# Patient Record
Sex: Male | Born: 1935
Health system: Southern US, Community
[De-identification: ages and names within clinical notes are randomized; demographics above are authoritative.]

## PROBLEM LIST (undated history)

## (undated) DIAGNOSIS — I739 Peripheral vascular disease, unspecified: Secondary | ICD-10-CM

## (undated) DIAGNOSIS — C4490 Unspecified malignant neoplasm of skin, unspecified: Secondary | ICD-10-CM

## (undated) DIAGNOSIS — I499 Cardiac arrhythmia, unspecified: Secondary | ICD-10-CM

## (undated) DIAGNOSIS — I509 Heart failure, unspecified: Secondary | ICD-10-CM

## (undated) DIAGNOSIS — I10 Essential (primary) hypertension: Secondary | ICD-10-CM

## (undated) DIAGNOSIS — Z9289 Personal history of other medical treatment: Secondary | ICD-10-CM

## (undated) DIAGNOSIS — I255 Ischemic cardiomyopathy: Secondary | ICD-10-CM

## (undated) DIAGNOSIS — G459 Transient cerebral ischemic attack, unspecified: Secondary | ICD-10-CM

## (undated) DIAGNOSIS — I219 Acute myocardial infarction, unspecified: Secondary | ICD-10-CM

## (undated) DIAGNOSIS — M1990 Unspecified osteoarthritis, unspecified site: Secondary | ICD-10-CM

## (undated) DIAGNOSIS — Z8673 Personal history of transient ischemic attack (TIA), and cerebral infarction without residual deficits: Secondary | ICD-10-CM

## (undated) DIAGNOSIS — E782 Mixed hyperlipidemia: Secondary | ICD-10-CM

## (undated) DIAGNOSIS — I2510 Atherosclerotic heart disease of native coronary artery without angina pectoris: Secondary | ICD-10-CM

## (undated) DIAGNOSIS — N390 Urinary tract infection, site not specified: Secondary | ICD-10-CM

## (undated) DIAGNOSIS — M89669 Osteopathy after poliomyelitis, unspecified lower leg: Secondary | ICD-10-CM

## (undated) DIAGNOSIS — I513 Intracardiac thrombosis, not elsewhere classified: Secondary | ICD-10-CM

## (undated) DIAGNOSIS — B91 Sequelae of poliomyelitis: Secondary | ICD-10-CM

## (undated) DIAGNOSIS — Z8701 Personal history of pneumonia (recurrent): Secondary | ICD-10-CM

## (undated) HISTORY — DX: Cardiac arrhythmia, unspecified: I49.9

## (undated) HISTORY — PX: JOINT REPLACEMENT: SHX530

## (undated) HISTORY — DX: Personal history of pneumonia (recurrent): Z87.01

## (undated) HISTORY — DX: Intracardiac thrombosis, not elsewhere classified: I51.3

## (undated) HISTORY — DX: Mixed hyperlipidemia: E78.2

## (undated) HISTORY — DX: Personal history of transient ischemic attack (TIA), and cerebral infarction without residual deficits: Z86.73

## (undated) HISTORY — DX: Heart failure, unspecified: I50.9

---
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
## 1978-04-02 DIAGNOSIS — I219 Acute myocardial infarction, unspecified: Secondary | ICD-10-CM

## 1978-04-02 HISTORY — DX: Acute myocardial infarction, unspecified: I21.9

## 1979-04-03 HISTORY — PX: CORONARY ARTERY BYPASS GRAFT: SHX141

## 1987-04-03 DIAGNOSIS — G459 Transient cerebral ischemic attack, unspecified: Secondary | ICD-10-CM

## 1987-04-03 HISTORY — DX: Transient cerebral ischemic attack, unspecified: G45.9

## 2001-08-18 ENCOUNTER — Emergency Department (HOSPITAL_COMMUNITY): Admission: EM | Admit: 2001-08-18 | Discharge: 2001-08-18 | Payer: Self-pay | Admitting: *Deleted

## 2006-01-16 ENCOUNTER — Ambulatory Visit: Payer: Self-pay | Admitting: Gastroenterology

## 2006-02-12 ENCOUNTER — Ambulatory Visit: Payer: Self-pay | Admitting: Gastroenterology

## 2006-02-12 LAB — CONVERTED CEMR LAB: aPTT: 32.1 s (ref 26.5–36.5)

## 2010-09-12 ENCOUNTER — Other Ambulatory Visit (HOSPITAL_COMMUNITY): Payer: Self-pay | Admitting: Urology

## 2010-09-13 ENCOUNTER — Ambulatory Visit (HOSPITAL_COMMUNITY)
Admission: RE | Admit: 2010-09-13 | Discharge: 2010-09-13 | Disposition: A | Discharge status: Home / Self Care | Payer: Medicare Other | Source: Ambulatory Visit | Attending: Urology | Admitting: Urology

## 2010-09-13 DIAGNOSIS — N489 Disorder of penis, unspecified: Secondary | ICD-10-CM | POA: Insufficient documentation

## 2010-09-13 DIAGNOSIS — N329 Bladder disorder, unspecified: Secondary | ICD-10-CM | POA: Insufficient documentation

## 2010-09-13 DIAGNOSIS — R319 Hematuria, unspecified: Secondary | ICD-10-CM | POA: Insufficient documentation

## 2010-09-13 MED ORDER — IOHEXOL 300 MG/ML  SOLN
125.0000 mL | Freq: Once | INTRAMUSCULAR | Status: AC | PRN
  Administered 2010-09-13: 125 mL via INTRAVENOUS

## 2010-09-29 ENCOUNTER — Other Ambulatory Visit (HOSPITAL_COMMUNITY): Payer: Medicare Other

## 2010-10-03 ENCOUNTER — Encounter (HOSPITAL_COMMUNITY)
Admission: RE | Admit: 2010-10-03 | Discharge: 2010-10-03 | Disposition: A | Discharge status: Home / Self Care | Payer: Medicare Other | Source: Ambulatory Visit | Attending: Urology | Admitting: Urology

## 2010-10-03 ENCOUNTER — Other Ambulatory Visit: Payer: Self-pay

## 2010-10-03 ENCOUNTER — Encounter (HOSPITAL_COMMUNITY): Payer: Self-pay

## 2010-10-03 HISTORY — DX: Peripheral vascular disease, unspecified: I73.9

## 2010-10-03 HISTORY — DX: Osteopathy after poliomyelitis, unspecified lower leg: M89.669

## 2010-10-03 HISTORY — DX: Unspecified osteoarthritis, unspecified site: M19.90

## 2010-10-03 HISTORY — DX: Atherosclerotic heart disease of native coronary artery without angina pectoris: I25.10

## 2010-10-03 HISTORY — DX: Sequelae of poliomyelitis: B91

## 2010-10-03 HISTORY — DX: Acute myocardial infarction, unspecified: I21.9

## 2010-10-03 LAB — SURGICAL PCR SCREEN: Staphylococcus aureus: NEGATIVE

## 2010-10-03 LAB — BASIC METABOLIC PANEL
BUN: 24 mg/dL — ABNORMAL HIGH (ref 6–23)
Calcium: 9.6 mg/dL (ref 8.4–10.5)
GFR calc Af Amer: >60 mL/min (ref >60)
GFR calc non Af Amer: >60 mL/min (ref >60)
Glucose, Bld: 99 mg/dL (ref 70–99)
Potassium: 4.2 mEq/L (ref 3.5–5.1)
Sodium: 137 mEq/L (ref 135–145)

## 2010-10-03 LAB — CBC
MCH: 30.2 pg (ref 26.0–34.0)
MCHC: 33.5 g/dL (ref 30.0–36.0)
RDW: 12.6 % (ref 11.5–15.5)

## 2010-10-03 NOTE — Patient Instructions (Signed)
20 ETHEN Benjamin Cordova  10/03/2010   Your procedure is scheduled on:  10/10/10  Report to [facility] at 0730 AM.  Call this number if you have problems the morning of surgery: [phone]   Remember:   Do not eat food:After Midnight.  Do not drink clear liquids: After Midnight.  Take these medicines the morning of surgery with A SIP OF WATER: atenolol, hydrochlorothiazide             STOP WARFARIN AS MD ORDERED 5 DAYS PRIOR TO SURGERY   Do not wear jewelry, make-up or nail polish.  Do not bring valuables to the hospital.  Contacts, dentures or bridgework may not be worn into surgery.  Leave suitcase in the car. After surgery it may be brought to your room.  For patients admitted to the hospital, checkout time is 11:00 AM the day of discharge.   Patients discharged the day of surgery will not be allowed to drive home.  Name and phone number of your driver: wife  Special Instructions: CHG Shower Shower 2 days before surgery and 1 day before surgery with Hibiclens.   Please read over the following fact sheets that you were given: Pain Booklet, MRSA Information, Surgical Site Infection Prevention and Anesthesia Post-op Instructions   PATIENT INSTRUCTIONS POST-ANESTHESIA  IMMEDIATELY FOLLOWING SURGERY:  Do not drive or operate machinery for the first twenty four hours after surgery.  Do not make any important decisions for twenty four hours after surgery or while taking narcotic pain medications or sedatives.  If you develop intractable nausea and vomiting or a severe headache please notify your doctor immediately.  FOLLOW-UP:  Please make an appointment with your surgeon as instructed. You do not need to follow up with anesthesia unless specifically instructed to do so.  WOUND CARE INSTRUCTIONS (if applicable):  Keep a dry clean dressing on the anesthesia/puncture wound site if there is drainage.  Once the wound has quit draining you may leave it open to air.  Generally you should leave the  bandage intact for twenty four hours unless there is drainage.  If the epidural site drains for more than 36-48 hours please call the anesthesia department.  QUESTIONS?:  Please feel free to call your physician or the hospital operator if you have any questions, and they will be happy to assist you.     [facility] Anesthesia Department [address] [place] [phone]

## 2010-10-05 ENCOUNTER — Ambulatory Visit (HOSPITAL_COMMUNITY): Admission: RE | Admit: 2010-10-05 | Payer: Medicare Other | Source: Ambulatory Visit | Admitting: Urology

## 2010-10-10 ENCOUNTER — Encounter (HOSPITAL_COMMUNITY): Payer: Self-pay | Admitting: *Deleted

## 2010-10-10 ENCOUNTER — Encounter (HOSPITAL_COMMUNITY): Payer: Self-pay | Admitting: Anesthesiology

## 2010-10-10 ENCOUNTER — Ambulatory Visit (HOSPITAL_COMMUNITY)
Admission: RE | Admit: 2010-10-10 | Discharge: 2010-10-10 | Disposition: A | Discharge status: Home / Self Care | Payer: Medicare Other | Source: Ambulatory Visit | Attending: Urology | Admitting: Urology

## 2010-10-10 ENCOUNTER — Ambulatory Visit (HOSPITAL_COMMUNITY): Payer: Medicare Other | Admitting: Anesthesiology

## 2010-10-10 ENCOUNTER — Encounter (HOSPITAL_COMMUNITY)
Admission: RE | Disposition: A | Discharge status: Home / Self Care | Payer: Self-pay | Source: Ambulatory Visit | Attending: Urology

## 2010-10-10 ENCOUNTER — Ambulatory Visit (HOSPITAL_COMMUNITY): Payer: Medicare Other

## 2010-10-10 DIAGNOSIS — N329 Bladder disorder, unspecified: Secondary | ICD-10-CM

## 2010-10-10 DIAGNOSIS — Z01812 Encounter for preprocedural laboratory examination: Secondary | ICD-10-CM | POA: Insufficient documentation

## 2010-10-10 DIAGNOSIS — R310 Gross hematuria: Secondary | ICD-10-CM | POA: Insufficient documentation

## 2010-10-10 DIAGNOSIS — N400 Enlarged prostate without lower urinary tract symptoms: Secondary | ICD-10-CM | POA: Insufficient documentation

## 2010-10-10 HISTORY — PX: CYSTOSCOPY WITH INJECTION: SHX1424

## 2010-10-10 SURGERY — CYSTOSCOPY, WITH INJECTION OF BLADDER NECK OR BLADDER WALL
Anesthesia: General | Wound class: Clean Contaminated

## 2010-10-10 MED ORDER — LIDOCAINE HCL (PF) 1 % IJ SOLN
INTRAMUSCULAR | Status: AC
  Filled 2010-10-10: qty 2

## 2010-10-10 MED ORDER — NON FORMULARY: Status: DC | PRN

## 2010-10-10 MED ORDER — ONDANSETRON HCL 4 MG/2ML IJ SOLN: 4.0000 mg | Freq: Once | INTRAMUSCULAR | Status: DC | PRN

## 2010-10-10 MED ORDER — PROPOFOL 10 MG/ML IV EMUL
INTRAVENOUS | Status: DC | PRN
  Administered 2010-10-10: 160 mg via INTRAVENOUS

## 2010-10-10 MED ORDER — LACTATED RINGERS IV SOLN
INTRAVENOUS | Status: DC | PRN
  Administered 2010-10-10 (×2): via INTRAVENOUS

## 2010-10-10 MED ORDER — SURGILUBE EX GEL
CUTANEOUS | Status: DC | PRN
  Administered 2010-10-10: 4 via TOPICAL

## 2010-10-10 MED ORDER — LACTATED RINGERS IV SOLN
INTRAVENOUS | Status: DC
  Administered 2010-10-10: 1000 mL via INTRAVENOUS

## 2010-10-10 MED ORDER — MIDAZOLAM HCL 2 MG/2ML IJ SOLN
INTRAMUSCULAR | Status: AC
  Filled 2010-10-10: qty 2

## 2010-10-10 MED ORDER — FENTANYL CITRATE 0.05 MG/ML IJ SOLN
INTRAMUSCULAR | Status: DC
Start: 2010-10-10 — End: 2010-10-10
  Filled 2010-10-10: qty 2

## 2010-10-10 MED ORDER — PROPOFOL 10 MG/ML IV EMUL
INTRAVENOUS | Status: AC
  Filled 2010-10-10: qty 20

## 2010-10-10 MED ORDER — MIDAZOLAM HCL 2 MG/2ML IJ SOLN
1.0000 mg | INTRAMUSCULAR | Status: DC | PRN
  Administered 2010-10-10 (×2): 2 mg via INTRAVENOUS

## 2010-10-10 MED ORDER — MIDAZOLAM HCL 2 MG/2ML IJ SOLN
INTRAMUSCULAR | Status: AC
  Administered 2010-10-10: 2 mg via INTRAVENOUS
  Filled 2010-10-10: qty 2

## 2010-10-10 MED ORDER — FENTANYL CITRATE 0.05 MG/ML IJ SOLN
INTRAMUSCULAR | Status: DC | PRN
  Administered 2010-10-10: 50 ug via INTRAVENOUS

## 2010-10-10 MED ORDER — FENTANYL CITRATE 0.05 MG/ML IJ SOLN: 25.0000 ug | INTRAMUSCULAR | Status: DC | PRN

## 2010-10-10 MED ORDER — EPHEDRINE SULFATE 50 MG/ML IJ SOLN
INTRAMUSCULAR | Status: DC | PRN
  Administered 2010-10-10: 10 mg via INTRAVENOUS

## 2010-10-10 MED ORDER — IOHEXOL 350 MG/ML SOLN
INTRAVENOUS | Status: DC | PRN
  Administered 2010-10-10: 50 mL

## 2010-10-10 SURGICAL SUPPLY — 27 items
BAG DRAIN URO TABLE W/ADPT NS (DRAPE) ×2 IMPLANT
BAG DRN 8 ADPR NS SKTRN CSTL (DRAPE) ×1
BAG HAMPER (MISCELLANEOUS) ×2 IMPLANT
CATH 5 FR WEDGE TIP (UROLOGICAL SUPPLIES) ×2 IMPLANT
CLOTH BEACON ORANGE TIMEOUT ST (SAFETY) ×2 IMPLANT
DECANTER SPIKE VIAL GLASS SM (MISCELLANEOUS) ×2 IMPLANT
DILATOR BALLN URETERAL SET (BALLOONS) IMPLANT
FLOOR PAD 36X40 (MISCELLANEOUS)
GLOVE BIO SURGEON STRL SZ7 (GLOVE) ×2 IMPLANT
GLOVE ECLIPSE 7.0 STRL STRAW (GLOVE) ×1 IMPLANT
GLOVE ECLIPSE 7.5 STRL STRAW (GLOVE) ×1 IMPLANT
GLOVE INDICATOR 6.5 STRL GRN (GLOVE) ×1 IMPLANT
GLOVE INDICATOR 7.0 STRL GRN (GLOVE) ×1 IMPLANT
GLOVE INDICATOR 7.5 STRL GRN (GLOVE) ×1 IMPLANT
GLOVE SS BIOGEL STRL SZ 6.5 (GLOVE) IMPLANT
GLOVE SUPERSENSE BIOGEL SZ 6.5 (GLOVE) ×1
GOWN BRE IMP SLV AUR XL STRL (GOWN DISPOSABLE) ×2 IMPLANT
IV NS IRRIG 3000ML ARTHROMATIC (IV SOLUTION) ×4 IMPLANT
KIT ROOM TURNOVER AP CYSTO (KITS) ×2 IMPLANT
MANIFOLD NEPTUNE II (INSTRUMENTS) ×2 IMPLANT
PACK CYSTO (CUSTOM PROCEDURE TRAY) ×2 IMPLANT
PAD ARMBOARD 7.5X6 YLW CONV (MISCELLANEOUS) ×2 IMPLANT
PAD FLOOR 36X40 (MISCELLANEOUS) ×1 IMPLANT
STENT PERCUFLEX 4.8FRX24 (STENTS) IMPLANT
TOWEL OR 17X26 4PK STRL BLUE (TOWEL DISPOSABLE) ×2 IMPLANT
WATER STERILE IRR 1000ML POUR (IV SOLUTION) ×2 IMPLANT
WIRE GUIDE BENTSON .035 15CM (WIRE) ×2 IMPLANT

## 2010-10-10 NOTE — Anesthesia Postprocedure Evaluation (Signed)
  Anesthesia Post-op Note  Patient: Benjamin Cordova  Procedure(s) Performed:  CYSTOSCOPY WITH INJECTION - with retrograde urethrogram  Patient Location: PACU  Anesthesia Type: General  Level of Consciousness: alert   Airway and Oxygen Therapy: Patient Spontanous Breathing  Post-op Pain: none  Post-op Assessment: Patient's Cardiovascular Status Stable, Respiratory Function Stable and Patent Airway  Post-op Vital Signs: stable  Complications: No apparent anesthesia complications

## 2010-10-10 NOTE — Anesthesia Procedure Notes (Addendum)
Performed by: Despina Hidden    Performed by: Despina Hidden

## 2010-10-10 NOTE — Anesthesia Preprocedure Evaluation (Addendum)
Anesthesia Evaluation  Name, MR# and DOB Patient awake  General Assessment Comment  Reviewed: Allergy & Precautions, H&P , Patient's Chart, lab work & pertinent test results and reviewed documented beta blocker date and time   History of Anesthesia Complications Negative for: history of anesthetic complications  Airway Mallampati: II TM Distance: >3 FB Neck ROM: Full    Dental  (+) Edentulous Upper and Edentulous Lower   Pulmonary    pulmonary exam normal   Cardiovascular hypertension, Pt. on medications and Pt. on home beta blockers + CAD (no CP now), + Past MI (1980) and + CABG    Neuro/PsychPolio 1952 CVA, No Residual Symptoms  GI/Hepatic/Renal Gross Hematuria    Endo/Other   Abdominal   Musculoskeletal  Hematology   Peds  Reproductive/Obstetrics          Anesthesia Physical Anesthesia Plan  ASA: III  Anesthesia Plan: General   Post-op Pain Management:    Induction:   Airway Management Planned: LMA  Additional Equipment:   Intra-op Plan:   Post-operative Plan:   Informed Consent: I have reviewed the patients History and Physical, chart, labs and discussed the procedure including the risks, benefits and alternatives for the proposed anesthesia with the patient or authorized representative who has indicated his/her understanding and acceptance.     Plan Discussed with: CRNA  Anesthesia Plan Comments:         Anesthesia Quick Evaluation

## 2010-10-10 NOTE — Brief Op Note (Addendum)
10/10/2010  12:23 PM  PATIENT:  Benjamin Cordova  76 y.o. male  PRE-OPERATIVE DIAGNOSIS:  gross hematuria  POST-OPERATIVE DIAGNOSIS:  gross hematuria, benign prostatic hypertrophy  PROCEDURE:  Procedure(s): CYSTOSCOPY and retrograde urethrogram  SURGEON:  Surgeon(s): Ky Barban  PHYSICIAN ASSISTANT:   ASSISTANTS: no   ANESTHESIA:   general  ESTIMATED BLOOD LOSS: * No blood loss . *   BLOOD ADMINISTERED:none  DRAINS: none   LOCAL MEDICATIONS USED:  NONE  SPECIMEN:  No Specimen  DISPOSITION OF SPECIMEN:  N/A  COUNTS:  YES  TOURNIQUET:  * No tourniquets in log *  DICTATION #: [id number]---  PLAN OF CARE:  PATIENT DISPOSITION:  PACU - hemodynamically stable.

## 2010-10-10 NOTE — Transfer of Care (Signed)
Immediate Anesthesia Transfer of Care Note  Patient: Benjamin Cordova  Procedure(s) Performed:  CYSTOSCOPY WITH INJECTION - with retrograde urethrogram  Patient Location: PACU  Anesthesia Type: General  Level of Consciousness: awake, alert  and oriented  Airway & Oxygen Therapy: non-rebreather face mask  Post-op Assessment: Report given to PACU RN, Post -op Vital signs reviewed and stable and Patient moving all extremities  Post vital signs: stable  Complications: No apparent anesthesia complications

## 2010-10-10 NOTE — H&P (Signed)
  Please see dictated h&pUpdate no change

## 2010-10-11 NOTE — Progress Notes (Signed)
Bleeding much better today.

## 2010-10-11 NOTE — Op Note (Signed)
NAME:  Benjamin Cordova, TORY NO.:  [id number]  MEDICAL RECORD NO.:  [id number]  LOCATION:                                 FACILITY:  PHYSICIAN:  Ky Barban, M.D.DATE OF BIRTH:  [birth year]  DATE OF PROCEDURE: DATE OF DISCHARGE:                              OPERATIVE REPORT   PREOPERATIVE DIAGNOSIS:  Gross hematuria.  POSTOPERATIVE DIAGNOSIS:  Benign prostatic hypertrophy.  PROCEDURE:  Cystoscopy, retrograde urethrogram.  PROCEDURE IN DETAIL:  The patient placed in lithotomy position.  After usual prep and drape under general anesthesia, Brodney clamp was applied to the penis and solution of KY-Jelly and Hypaque was injected into the urethra under fluoroscopic control.  The urethra looks normal.  I do not see any stricture.  The clamp was removed.  I introduced #25 cystoscope under direct vision, went into the bladder.  Bladder is markedly trabeculated.  No tumor stone, foreign body, or inflammation.  I inspected the bladder with forward oblique and right angle lenses. Prostatic urethra was inspected.  There is median lobe causing most of the bladder neck obstruction.  There is a small passage on the left side, but most of the bladder neck is blocked because of this median lobe.  Cystoscope was removed.  The patient left the operating room in satisfactory condition.     Ky Barban, M.D.     MIJ/MEDQ  D:  10/10/2010  T:  10/11/2010  Job:  [id number]

## 2010-10-12 NOTE — H&P (Signed)
NAME:  LOPEZ, DENTINGER NO.:  MEDICAL RECORD NO.:  [id number]  LOCATION:                                 FACILITY:  PHYSICIAN:  Ky Barban, M.D.    DATE OF BIRTH:  DATE OF ADMISSION: DATE OF DISCHARGE:  LH                             HISTORY & PHYSICAL   CHIEF COMPLAINT:  Gross total painless hematuria.  HISTORY OF PRESENT ILLNESS:  A 76 year old gentleman was referred to me on September 12, 2010, by Dr. Sudie Bailey.  The patient is having gross total painless hematuria.  His AUA score is 11.  He has 2/3 episodes of gross hematuria in the last 10 years.  He is on Coumadin because of history of having mild stroke in 1989.  PAST MEDICAL HISTORY:  Open heart surgery in 1981.  No history of diabetes or hypertension.  He had polio 1952, he has weakness in his right leg.  FAMILY HISTORY:  One brother prostate cancer.  PERSONAL HISTORY:  Does not smoke or drink.  He quit smoking in 1980.  List of medicines he is taking vitamin C, B complex, folic acid, and Tylenol.  He takes Coumadin 10 mg 2 tablets daily, atenolol, simvastatin, hydrochlorothiazide, and enalapril.  His Coumadin has been discontinued and instead  he has been placed on Flomax as per Dr. Sudie Bailey.  I tried to cystoscope him in the office I could not get into his bladder, so I decided to do this cystoscopy under anesthesia in the hospital.  A CT scan shows normal upper tracts.  Bladder is thick take and appears to have cystitis.  No other abnormality in the bladder like stones or tumor on the basis of CT scan.  Urine cytologies are negative also.  No chest pain, orthopnea, PND, nausea, vomiting.  PHYSICAL EXAMINATION:  VITAL SIGNS:  Blood pressure 118/71 and temperature 97.2. CENTRAL NERVOUS SYSTEM:  No gross neurological deficit has weakness of his right leg from previous polio. ABDOMEN:  Soft, flat.  Liver, spleen, kidneys are not palpable. HEART:  Regular sinus rhythm.  No  murmur. CHEST:  Clear to auscultation. EXTERNAL GENITALIA:  Unremarkable. RECTAL:  Prostate 1-1/2+ smooth and firm.  IMPRESSION:  Gross hematuria, difficulty to void.  His residual urine was  81 mL on bladder scan.  Cystometrics are completely normal.  So what I have suggested that we do a retrograde urethrogram and cystoscopy under anesthesia as an outpatient.     Ky Barban, M.D.     MIJ/MEDQ  D:  10/09/2010  T:  10/10/2010  Job:  [id number]  cc:   Dr. Sudie Bailey

## 2010-10-24 ENCOUNTER — Encounter (HOSPITAL_COMMUNITY): Payer: Self-pay | Admitting: Urology

## 2010-10-25 NOTE — Brief Op Note (Signed)
Report#[id number]

## 2010-10-26 NOTE — Op Note (Signed)
NAME:  Benjamin Cordova, Benjamin Cordova NO.:  [id number]  MEDICAL RECORD NO.:  [id number]  LOCATION:  [facility]                          FACILITY:  [facility]  PHYSICIAN:  Ky Barban, M.D.DATE OF BIRTH:  03/29/1935  DATE OF PROCEDURE: DATE OF DISCHARGE:  10/10/2010                              OPERATIVE REPORT   PREOPERATIVE DIAGNOSIS:  Recurrent gross hematuria.  POSTOPERATIVE DIAGNOSIS:  Benign prostatic hypertrophy.  OPERATION:  Cystoscopy.  PROCEDURE:  The patient was placed in supine position, after usual prep and drape, Xylocaine jelly was instilled into the urethra after waiting adequate time, flexible cystoscope was introduced under direct vision. Anterior urethra looked normal.  Prostatic urethra was obstructed with lateral lobe hypertrophy and prostate urethra seems to be inflamed, bladder is 2+ trabeculated.  No tumor, stone, foreign body, or inflammation.  Most likely site of his bleeding is from the prostatic urethra.  Cystoscope was removed.  The patient left the operating room in satisfactory condition.     Ky Barban, M.D.     MIJ/MEDQ  D:  10/25/2010  T:  10/26/2010  Job:  [id number]

## 2011-09-21 ENCOUNTER — Encounter (HOSPITAL_COMMUNITY): Payer: Self-pay | Admitting: *Deleted

## 2011-09-21 ENCOUNTER — Emergency Department (HOSPITAL_COMMUNITY)
Admission: EM | Admit: 2011-09-21 | Discharge: 2011-09-21 | Disposition: A | Discharge status: Home / Self Care | Payer: Medicare Other | Attending: Emergency Medicine | Admitting: Emergency Medicine

## 2011-09-21 DIAGNOSIS — D6832 Hemorrhagic disorder due to extrinsic circulating anticoagulants: Secondary | ICD-10-CM

## 2011-09-21 DIAGNOSIS — I10 Essential (primary) hypertension: Secondary | ICD-10-CM | POA: Insufficient documentation

## 2011-09-21 DIAGNOSIS — I252 Old myocardial infarction: Secondary | ICD-10-CM | POA: Insufficient documentation

## 2011-09-21 DIAGNOSIS — H1189 Other specified disorders of conjunctiva: Secondary | ICD-10-CM | POA: Insufficient documentation

## 2011-09-21 DIAGNOSIS — H1130 Conjunctival hemorrhage, unspecified eye: Secondary | ICD-10-CM

## 2011-09-21 LAB — PROTIME-INR: Prothrombin Time: 25.6 seconds — ABNORMAL HIGH (ref 11.6–15.2)

## 2011-09-21 NOTE — ED Provider Notes (Signed)
History     CSN: [id number]  Arrival date & time 09/21/11  1549   First MD Initiated Contact with Patient 09/21/11 1556      Chief Complaint  Patient presents with  . Eye Problem    (Consider location/radiation/quality/duration/timing/severity/associated sxs/prior treatment) HPI  Pt relates he was fine and played golf today. He relates his wife went to lunch and when they sat down she noted something was wrong with his eye. He states when he looked in the mirror he saw blood around his eye. He denies any pain in his eye or change of vision. He denies any coughing, sneezing, or straining. He states he plays golf regularly although he did have 2 weeks off recently. He states he's had this before but this was worse than what he had before.   PCP Dr. Sudie Bailey    Past Medical History  Diagnosis Date  . Coronary artery disease   . Hypertension   . Myocardial infarction 1980  . Arthritis     HANDS/KNEES  . Hematuria   . Bladder infection, acute   . Peripheral vascular disease   . Polio osteopathy of lower leg AGE 77    RIGHT    Past Surgical History  Procedure Date  . Tonsillectomy age 71    [facility]  . Foot surgery 1954    [facility]  . Coronary artery bypass graft 1981    [facility]  . Cystoscopy with injection 10/10/2010    Procedure: CYSTOSCOPY WITH INJECTION;  Surgeon: Ky Barban;  Location: [facility];  Service: Urology;  Laterality: N/A;  with retrograde urethrogram    Family History  Problem Relation Age of Onset  . Anesthesia problems Neg Hx   . Hypotension Neg Hx   . Malignant hyperthermia Neg Hx   . Pseudochol deficiency Neg Hx     History  Substance Use Topics  . Smoking status: Former Smoker -- 28 years    Quit date: 12/02/1978  . Smokeless tobacco: Never Used  . Alcohol Use: No  lives at home Lives with spouse    Review of Systems  All other systems reviewed and are negative.    Allergies  Review of  patient's allergies indicates no known allergies.  Home Medications   Current Outpatient Rx  Name Route Sig Dispense Refill  . ATENOLOL 100 MG PO TABS Oral Take 50 mg by mouth daily.      Kristin Bruins B-COMPLEX/VIT C/FA PO TABS Oral Take 1 tablet by mouth daily.      Marland Kitchen DIPHENHYDRAMINE-APAP (SLEEP) 25-500 MG PO TABS Oral Take 1 tablet by mouth at bedtime.      . ENALAPRIL MALEATE 20 MG PO TABS Oral Take 10 mg by mouth daily.      Marland Kitchen FOLIC ACID 800 MCG PO TABS Oral Take 800 mcg by mouth daily.     Marland Kitchen HYDROCHLOROTHIAZIDE 25 MG PO TABS Oral Take 12.5 mg by mouth daily.      . MULTIVITAMIN PO Oral Take 1 tablet by mouth daily.      Marland Kitchen NAPROXEN SODIUM 220 MG PO TABS Oral Take 220 mg by mouth 2 (two) times daily with a meal.    . OMEGA-3 KRILL OIL PO Oral Take 1 tablet by mouth at bedtime.      Marland Kitchen HEALTHY COLON PO CAPS Oral Take 1 capsule by mouth daily.      Marland Kitchen SIMVASTATIN 40 MG PO TABS Oral Take 40 mg by mouth at bedtime.      Marland Kitchen  VITAMIN C 500 MG PO TABS Oral Take 500 mg by mouth daily.      . WARFARIN SODIUM 10 MG PO TABS Oral Take 5-10 mg by mouth daily. Patient takes 1/2 tablet(5mg ) on Monday and Friday all other days patient takes 1 tablet(10mg ).      BP 121/67  Pulse 81  Temp 98.2 F (36.8 C) (Oral)  Resp 20  Ht 5' 10.5" (1.791 m)  Wt 225 lb (102.059 kg)  BMI 31.83 kg/m2  SpO2 97%  Vital signs normal    Physical Exam  Constitutional: He is oriented to person, place, and time. He appears well-developed and well-nourished.  Non-toxic appearance. He does not appear ill. No distress.  HENT:  Head: Normocephalic and atraumatic.  Right Ear: External ear normal.  Left Ear: External ear normal.  Nose: Nose normal. No mucosal edema or rhinorrhea.  Mouth/Throat: Oropharynx is clear and moist and mucous membranes are normal. No dental abscesses or uvula swelling.  Eyes: Conjunctivae and EOM are normal. Pupils are equal, round, and reactive to light.       Patient has marked diffuse  subconjunctival hemorrhage with some slight bulging inferior to the cornea  VA 20/25 OD, 20/20 OS, bilat 20/25  Neck: Normal range of motion and full passive range of motion without pain. Neck supple.  Pulmonary/Chest: Effort normal and breath sounds normal. No respiratory distress. He has no rhonchi. He exhibits no crepitus.  Abdominal: Soft. Normal appearance and bowel sounds are normal.  Musculoskeletal: Normal range of motion. He exhibits no edema and no tenderness.       Moves all extremities well.   Neurological: He is alert and oriented to person, place, and time. He has normal strength. No cranial nerve deficit.  Skin: Skin is warm, dry and intact. No rash noted. No erythema. No pallor.  Psychiatric: He has a normal mood and affect. His speech is normal and behavior is normal. His mood appears not anxious.    ED Course  Procedures (including critical care time)  Pt given ice pack. He has less bulging of the sclera at time of discharge. Still denies pain or change in vision  Labs Reviewed  APTT - Abnormal; Notable for the following:    aPTT 38 (*)     All other components within normal limits  PROTIME-INR - Abnormal; Notable for the following:    Prothrombin Time 25.6 (*)     INR 2.29 (*)     All other components within normal limits   Laboratory interpretation all normal except therapeutic INR   1. Subconjunctival hematoma   2. Warfarin-induced coagulopathy    Plan discharge  Devoria Albe, MD, FACEP    MDM          Ward Givens, MD 09/21/11 [phone]

## 2011-09-21 NOTE — Discharge Instructions (Signed)
Ice packs to the area. You should have an ophthalmologist recheck your eye on Monday, return to the ED if you get pain in your eye, you get a change in your vision or you seem worse in any way.    Subconjunctival Hemorrhage A subconjunctival hemorrhage is a bright red patch covering a portion of the white of the eye. The white part of the eye is called the sclera, and it is covered by a thin membrane called the conjunctiva. This membrane is clear, except for tiny blood vessels that you can see with the naked eye. When your eye is irritated or inflamed and becomes red, it is because the vessels in the conjunctiva are swollen. Sometimes, a blood vessel in the conjunctiva can break and bleed. When this occurs, the blood builds up between the conjunctiva and the sclera, and spreads out to create a red area. The red spot may be very small at first. It may then spread to cover a larger part of the surface of the eye, or even all of the visible white part of the eye. In almost all cases, the blood will go away and the eye will become white again. Before completely dissolving, however, the red area may spread. It may also become brownish-yellow in color, before going away. If a lot of blood collects under the conjunctiva, it may look like a bulge on the surface of the eye. This looks scary, but it will also eventually flatten out and go away. Subconjunctival hemorrhages do not cause pain, but if swollen, may cause a feeling of irritation. There is no effect on vision.  CAUSES   The most common cause is mild trauma (rubbing the eye, irritation).   Subconjunctival hemorrhages can happen because of coughing or straining (lifting heavy objects), vomiting, or sneezing.   In some cases, your doctor may want to check your blood pressure. High blood pressure can also cause a sunconjunctival hemorrhage.   Severe trauma or blunt injuries.   Diseases that affect blood clotting (hemophilia, leukemia).   Abnormalities  of blood vessels behind the eye (carotid cavernous sinus fistula).   Tumors behind the eye.   Certain drugs (aspirin, coumadin, heparin).   Recent eye surgery.  HOME CARE INSTRUCTIONS   Do not worry about the appearance of your eye. You may continue your usual activities.   Often, follow-up is not necessary.  SEEK MEDICAL CARE IF:   Your eye becomes painful.   The bleeding does not disappear within 3 weeks.   Bleeding occurs elsewhere, for example, under the skin, in the mouth, or in the other eye.   You have recurring subconjunctival hemorrhages.  SEEK IMMEDIATE MEDICAL CARE IF:   Your vision changes or you have difficulty seeing.   You develop severe headache, persistent vomiting, confusion, or abnormal drowsiness (lethargy).   Your eye seems to bulge or protrude from the eye socket.   You notice the sudden appearance of bruises, or have spontaneous bleeding elsewhere on your body.  Document Released: 03/19/2005 Document Revised: 03/08/2011 Document Reviewed: 02/14/2009 [facility] Patient Information 2012 [place], [place].

## 2011-09-21 NOTE — ED Notes (Signed)
Lt eye conjunctival hemorrhage, taking coumadin, No injury

## 2014-04-09 DIAGNOSIS — R310 Gross hematuria: Secondary | ICD-10-CM | POA: Diagnosis not present

## 2014-04-09 DIAGNOSIS — Z7901 Long term (current) use of anticoagulants: Secondary | ICD-10-CM | POA: Diagnosis not present

## 2014-04-09 DIAGNOSIS — N390 Urinary tract infection, site not specified: Secondary | ICD-10-CM | POA: Diagnosis not present

## 2014-04-09 DIAGNOSIS — B9689 Other specified bacterial agents as the cause of diseases classified elsewhere: Secondary | ICD-10-CM | POA: Diagnosis not present

## 2014-04-19 DIAGNOSIS — Z7901 Long term (current) use of anticoagulants: Secondary | ICD-10-CM | POA: Diagnosis not present

## 2014-05-18 DIAGNOSIS — Z7901 Long term (current) use of anticoagulants: Secondary | ICD-10-CM | POA: Diagnosis not present

## 2014-05-19 DIAGNOSIS — M545 Low back pain: Secondary | ICD-10-CM | POA: Diagnosis not present

## 2014-05-19 DIAGNOSIS — Z7901 Long term (current) use of anticoagulants: Secondary | ICD-10-CM | POA: Diagnosis not present

## 2014-05-19 DIAGNOSIS — I10 Essential (primary) hypertension: Secondary | ICD-10-CM | POA: Diagnosis not present

## 2014-05-19 DIAGNOSIS — E782 Mixed hyperlipidemia: Secondary | ICD-10-CM | POA: Diagnosis not present

## 2014-05-20 DIAGNOSIS — R310 Gross hematuria: Secondary | ICD-10-CM | POA: Diagnosis not present

## 2014-05-20 DIAGNOSIS — N390 Urinary tract infection, site not specified: Secondary | ICD-10-CM | POA: Diagnosis not present

## 2014-06-01 DIAGNOSIS — I10 Essential (primary) hypertension: Secondary | ICD-10-CM | POA: Diagnosis not present

## 2014-06-01 DIAGNOSIS — Z7901 Long term (current) use of anticoagulants: Secondary | ICD-10-CM | POA: Diagnosis not present

## 2014-06-01 DIAGNOSIS — E782 Mixed hyperlipidemia: Secondary | ICD-10-CM | POA: Diagnosis not present

## 2014-06-01 DIAGNOSIS — N390 Urinary tract infection, site not specified: Secondary | ICD-10-CM | POA: Diagnosis not present

## 2014-06-04 DIAGNOSIS — Z7901 Long term (current) use of anticoagulants: Secondary | ICD-10-CM | POA: Diagnosis not present

## 2014-06-14 ENCOUNTER — Other Ambulatory Visit (HOSPITAL_COMMUNITY): Payer: Self-pay | Admitting: Family Medicine

## 2014-06-14 ENCOUNTER — Ambulatory Visit (HOSPITAL_COMMUNITY)
Admission: RE | Admit: 2014-06-14 | Discharge: 2014-06-14 | Disposition: A | Discharge status: Home / Self Care | Payer: Commercial Managed Care - HMO | Source: Ambulatory Visit | Attending: Family Medicine | Admitting: Family Medicine

## 2014-06-14 DIAGNOSIS — M25562 Pain in left knee: Secondary | ICD-10-CM

## 2014-06-14 DIAGNOSIS — Z7901 Long term (current) use of anticoagulants: Secondary | ICD-10-CM | POA: Diagnosis not present

## 2014-06-14 DIAGNOSIS — M1712 Unilateral primary osteoarthritis, left knee: Secondary | ICD-10-CM | POA: Insufficient documentation

## 2014-06-14 DIAGNOSIS — M25462 Effusion, left knee: Secondary | ICD-10-CM | POA: Diagnosis not present

## 2014-06-14 DIAGNOSIS — M76892 Other specified enthesopathies of left lower limb, excluding foot: Secondary | ICD-10-CM | POA: Insufficient documentation

## 2014-06-17 DIAGNOSIS — N390 Urinary tract infection, site not specified: Secondary | ICD-10-CM | POA: Diagnosis not present

## 2014-06-28 DIAGNOSIS — M25562 Pain in left knee: Secondary | ICD-10-CM | POA: Diagnosis not present

## 2014-06-28 DIAGNOSIS — M1712 Unilateral primary osteoarthritis, left knee: Secondary | ICD-10-CM | POA: Diagnosis not present

## 2014-07-03 ENCOUNTER — Encounter (HOSPITAL_COMMUNITY): Payer: Self-pay

## 2014-07-03 ENCOUNTER — Emergency Department (HOSPITAL_COMMUNITY)
Admission: EM | Admit: 2014-07-03 | Discharge: 2014-07-03 | Disposition: A | Discharge status: Home / Self Care | Payer: Commercial Managed Care - HMO | Attending: Emergency Medicine | Admitting: Emergency Medicine

## 2014-07-03 ENCOUNTER — Emergency Department (HOSPITAL_COMMUNITY): Payer: Commercial Managed Care - HMO

## 2014-07-03 DIAGNOSIS — W01198A Fall on same level from slipping, tripping and stumbling with subsequent striking against other object, initial encounter: Secondary | ICD-10-CM | POA: Insufficient documentation

## 2014-07-03 DIAGNOSIS — Z87448 Personal history of other diseases of urinary system: Secondary | ICD-10-CM | POA: Diagnosis not present

## 2014-07-03 DIAGNOSIS — I10 Essential (primary) hypertension: Secondary | ICD-10-CM | POA: Diagnosis not present

## 2014-07-03 DIAGNOSIS — Z8619 Personal history of other infectious and parasitic diseases: Secondary | ICD-10-CM | POA: Insufficient documentation

## 2014-07-03 DIAGNOSIS — Z7901 Long term (current) use of anticoagulants: Secondary | ICD-10-CM | POA: Diagnosis not present

## 2014-07-03 DIAGNOSIS — Z79899 Other long term (current) drug therapy: Secondary | ICD-10-CM | POA: Diagnosis not present

## 2014-07-03 DIAGNOSIS — M25561 Pain in right knee: Secondary | ICD-10-CM | POA: Diagnosis not present

## 2014-07-03 DIAGNOSIS — S8010XA Contusion of unspecified lower leg, initial encounter: Secondary | ICD-10-CM | POA: Diagnosis not present

## 2014-07-03 DIAGNOSIS — S8012XA Contusion of left lower leg, initial encounter: Secondary | ICD-10-CM | POA: Diagnosis not present

## 2014-07-03 DIAGNOSIS — M1990 Unspecified osteoarthritis, unspecified site: Secondary | ICD-10-CM | POA: Insufficient documentation

## 2014-07-03 DIAGNOSIS — I252 Old myocardial infarction: Secondary | ICD-10-CM | POA: Insufficient documentation

## 2014-07-03 DIAGNOSIS — Y9289 Other specified places as the place of occurrence of the external cause: Secondary | ICD-10-CM | POA: Diagnosis not present

## 2014-07-03 DIAGNOSIS — Y9389 Activity, other specified: Secondary | ICD-10-CM | POA: Insufficient documentation

## 2014-07-03 DIAGNOSIS — Z87891 Personal history of nicotine dependence: Secondary | ICD-10-CM | POA: Diagnosis not present

## 2014-07-03 DIAGNOSIS — Y998 Other external cause status: Secondary | ICD-10-CM | POA: Diagnosis not present

## 2014-07-03 DIAGNOSIS — S8991XA Unspecified injury of right lower leg, initial encounter: Secondary | ICD-10-CM | POA: Diagnosis not present

## 2014-07-03 DIAGNOSIS — I2510 Atherosclerotic heart disease of native coronary artery without angina pectoris: Secondary | ICD-10-CM | POA: Insufficient documentation

## 2014-07-03 DIAGNOSIS — Z791 Long term (current) use of non-steroidal anti-inflammatories (NSAID): Secondary | ICD-10-CM | POA: Diagnosis not present

## 2014-07-03 DIAGNOSIS — M7989 Other specified soft tissue disorders: Secondary | ICD-10-CM | POA: Diagnosis not present

## 2014-07-03 DIAGNOSIS — M79604 Pain in right leg: Secondary | ICD-10-CM | POA: Diagnosis not present

## 2014-07-03 DIAGNOSIS — S8011XA Contusion of right lower leg, initial encounter: Secondary | ICD-10-CM

## 2014-07-03 LAB — PROTIME-INR
INR: 2.02 — ABNORMAL HIGH (ref 0.00–1.49)
PROTHROMBIN TIME: 23.1 s — AB (ref 11.6–15.2)

## 2014-07-03 NOTE — ED Provider Notes (Signed)
CSN: [id number]     Arrival date & time 07/03/14  1623 History   First MD Initiated Contact with Patient 07/03/14 1822     Chief Complaint  Patient presents with  . Leg Pain      HPI Pt was seen at 1835. Per pt, c/o gradual onset and persistence of constant right medial lower leg "pain" that occurred earlier today. Pt states his lawnmower "tipped over" to right. States when he fell he hit his right shoulder and leg against the ground. Pt states his left knee brace hit his medial right lower leg and "bruised it." Pt states he came to the ED for evaluation "because I'm on coumadin." Last checked 06/06/14. Denies any other complaints. Pt has been ambulatory since the incident. Denies hitting head, no LOC, no neck or back pain, no shoulder pain, no CP/SOB, no abd pain, no hip pain.    Past Medical History  Diagnosis Date  . Coronary artery disease   . Hypertension   . Myocardial infarction 1980  . Arthritis     HANDS/KNEES  . Hematuria   . Bladder infection, acute   . Peripheral vascular disease   . Polio osteopathy of lower leg AGE 80    RIGHT   Past Surgical History  Procedure Laterality Date  . Tonsillectomy  age 62    [facility]  . Foot surgery  1954    [facility], MUSCLE TRANSPLANT  . Coronary artery bypass graft  [place]  . Cystoscopy with injection  10/10/2010    Procedure: CYSTOSCOPY WITH INJECTION;  Surgeon: Marissa Nestle;  Location: [facility];  Service: Urology;  Laterality: N/A;  with retrograde urethrogram   Family History  Problem Relation Age of Onset  . Anesthesia problems Neg Hx   . Hypotension Neg Hx   . Malignant hyperthermia Neg Hx   . Pseudochol deficiency Neg Hx    History  Substance Use Topics  . Smoking status: Former Smoker -- 28 years    Quit date: 12/02/1978  . Smokeless tobacco: Never Used  . Alcohol Use: No    Review of Systems ROS: Statement: All systems negative except as marked or noted in the HPI; Constitutional:  Negative for fever and chills. ; ; Eyes: Negative for eye pain, redness and discharge. ; ; ENMT: Negative for ear pain, hoarseness, nasal congestion, sinus pressure and sore throat. ; ; Cardiovascular: Negative for chest pain, palpitations, diaphoresis, dyspnea and peripheral edema. ; ; Respiratory: Negative for cough, wheezing and stridor. ; ; Gastrointestinal: Negative for nausea, vomiting, diarrhea, abdominal pain, blood in stool, hematemesis, jaundice and rectal bleeding. . ; ; Genitourinary: Negative for dysuria, flank pain and hematuria. ; ; Musculoskeletal: Negative for back pain and neck pain. Negative for swelling and deformity..; ; Skin: +bruising. Negative for pruritus, rash, abrasions, blisters, and skin lesion.; ; Neuro: Negative for headache, lightheadedness and neck stiffness. Negative for weakness, altered level of consciousness , altered mental status, extremity weakness, paresthesias, involuntary movement, seizure and syncope.      Allergies  Review of patient's allergies indicates no known allergies.  Home Medications   Prior to Admission medications   Medication Sig Start Date End Date Taking? Authorizing Provider  atenolol (TENORMIN) 100 MG tablet Take 50 mg by mouth daily.      Historical Provider, MD  B Complex-C-Folic Acid (SUPER B-COMPLEX/VIT C/FA) TABS Take 1 tablet by mouth daily.      Historical Provider, MD  diphenhydramine-acetaminophen (TYLENOL PM) 25-500  MG TABS Take 1 tablet by mouth at bedtime.      Historical Provider, MD  enalapril (VASOTEC) 20 MG tablet Take 10 mg by mouth daily.      Historical Provider, MD  folic acid (FOLVITE) 474 MCG tablet Take 800 mcg by mouth daily.     Historical Provider, MD  hydrochlorothiazide 25 MG tablet Take 12.5 mg by mouth daily.      Historical Provider, MD  Multiple Vitamin (MULTIVITAMIN PO) Take 1 tablet by mouth daily.      Historical Provider, MD  naproxen sodium (ALEVE) 220 MG tablet Take 220 mg by mouth 2 (two) times daily  with a meal.    Historical Provider, MD  OMEGA-3 KRILL OIL PO Take 1 tablet by mouth at bedtime.      Historical Provider, MD  predniSONE (DELTASONE) 5 MG tablet Take 5 mg by mouth 2 (two) times daily.    Historical Provider, MD  Probiotic Product (HEALTHY COLON) CAPS Take 1 capsule by mouth daily.      Historical Provider, MD  simvastatin (ZOCOR) 40 MG tablet Take 40 mg by mouth at bedtime.      Historical Provider, MD  vitamin C (ASCORBIC ACID) 500 MG tablet Take 500 mg by mouth daily.      Historical Provider, MD  warfarin (COUMADIN) 10 MG tablet Take 5-10 mg by mouth daily. Patient takes 1/2 tablet(5mg ) on Monday and Friday all other days patient takes 1 tablet(10mg ).    Historical Provider, MD   BP 131/69 mmHg  Pulse 69  Temp(Src) 97.6 F (36.4 C) (Oral)  Resp 15  Ht 5\' 11"  (1.803 m)  Wt 227 lb (102.967 kg)  BMI 31.67 kg/m2  SpO2 98% Physical Exam  1840; Physical examination:  Nursing notes reviewed; Vital signs and O2 SAT reviewed;  Constitutional: Well developed, Well nourished, Well hydrated, In no acute distress; Head:  Normocephalic, atraumatic; Eyes: EOMI, PERRL, No scleral icterus; ENMT: Mouth and pharynx normal, Mucous membranes moist; Neck: Supple, Full range of motion, No lymphadenopathy; Cardiovascular: Regular rate and rhythm, No gallop; Respiratory: Breath sounds clear & equal bilaterally, No wheezes.  Speaking full sentences with ease, Normal respiratory effort/excursion; Chest: Nontender, NT right clavicle. No ecchymosis or abrasions. Movement normal; Abdomen: Soft, Nontender, Nondistended, Normal bowel sounds. No ecchymosis or abrasions.; Genitourinary: No CVA tenderness; Extremities: Pulses normal, +faint ecchymosis right medial proximal tibial area, no open wounds, no erythema, no deformity. +FROM right knee, including able to lift extended RLE off stretcher, and extend right lower leg against resistance.  No ligamentous laxity.  No patellar or quad tendon step-offs.  NMS  intact right foot, strong pedal pp. +plantarflexion of right foot w/calf squeeze.  No palpable gap right Achilles's tendon.  No right proximal fibular head tenderness.  No edema, erythema, warmth, ecchymosis or deformity. NT right hip/knee/ankle/foot. NT right shoulder/elbow/wrist/hand, no edema, no ecchymosis, no rash, no open wounds, no deformity. No calf edema or asymmetry.; Neuro: AA&Ox3, Major CN grossly intact.  Speech clear. No gross focal motor or sensory deficits in extremities.; Skin: Color normal, Warm, Dry.   ED Course  Procedures     EKG Interpretation None      MDM  MDM Reviewed: previous chart, nursing note and vitals Reviewed previous: labs Interpretation: x-ray and labs     Results for orders placed or performed during the hospital encounter of 07/03/14  Protime-INR  Result Value Ref Range   Prothrombin Time 23.1 (H) 11.6 - 15.2 seconds   INR 2.02 (  H) 0.00 - 1.49   Dg Tibia/fibula Right 07/03/2014   CLINICAL DATA:  Leg pain, injured with lawnmower  EXAM: RIGHT TIBIA AND FIBULA - 2 VIEW  COMPARISON:  None.  FINDINGS: There is no evidence of fracture or other focal bone lesions. Medial soft tissue swelling is noted. Remote healed fracture deformities of the proximal fibula and distal tibia are noted. Vascular calcifications are identified.  IMPRESSION: Medial soft tissue swelling without acute osseous abnormality.   Electronically Signed   By: Conchita Paris M.D.   On: 07/03/2014 20:19   Dg Knee Complete 4 Views Right 07/03/2014   CLINICAL DATA:  Injured by lawnmower, knee pain  EXAM: RIGHT KNEE - COMPLETE 4+ VIEW  COMPARISON:  None.  FINDINGS: There is no evidence of fracture, dislocation, or joint effusion. There is no evidence of arthropathy or other focal bone abnormality. Medial soft tissue swelling is evident. Vascular calcifications are identified. Deformity likely indicating healed remote fracture of the proximal fibula is identified.  IMPRESSION: Medial soft tissue  swelling.  No acute osseous abnormality.   Electronically Signed   By: Conchita Paris M.D.   On: 07/03/2014 20:16    2100:  XR reassuring. Pt wants to go home now. Tx symptomatically. Dx and testing d/w pt and family.  Questions answered.  Verb understanding, agreeable to d/c home with outpt f/u.   Francine Graven, DO 07/05/14 2106

## 2014-07-03 NOTE — ED Notes (Signed)
Patient c/o right leg pain. Per patient "turned riding lown mower over causing knee brace on left knee to hit right leg. Denies hitting head, loc, or any other pain. Swelling and redness noted. Patient has ice applied. Per patient takes coumadin.

## 2014-07-03 NOTE — Discharge Instructions (Signed)
°Emergency Department Resource Guide °1) Find a Doctor and Pay Out of Pocket °Although you won't have to find out who is covered by your insurance plan, it is a good idea to ask around and get recommendations. You will then need to call the office and see if the doctor you have chosen will accept you as a new patient and what types of options they offer for patients who are self-pay. Some doctors offer discounts or will set up payment plans for their patients who do not have insurance, but you will need to ask so you aren't surprised when you get to your appointment. ° °2) Contact Your Local Health Department °Not all health departments have doctors that can see patients for sick visits, but many do, so it is worth a call to see if yours does. If you don't know where your local health department is, you can check in your phone book. The CDC also has a tool to help you locate your state's health department, and many state websites also have listings of all of their local health departments. ° °3) Find a Walk-in Clinic °If your illness is not likely to be very severe or complicated, you may want to try a walk in clinic. These are popping up all over the country in pharmacies, drugstores, and shopping centers. They're usually staffed by nurse practitioners or physician assistants that have been trained to treat common illnesses and complaints. They're usually fairly quick and inexpensive. However, if you have serious medical issues or chronic medical problems, these are probably not your best option. ° °No Primary Care Doctor: °- Call [facility] at  832-8000 - they can help you locate a primary care doctor that  accepts your insurance, provides certain services, etc. °- Physician Referral Service- 1-800-533-3463 ° °Chronic Pain Problems: °Organization         Address  Phone   Notes  °[facility]  ([phone]) [phone] Patients need to be referred by their primary care doctor.  ° °Medication  Assistance: °Organization         Address  Phone   Notes  °[place] [facility] [address] °[place], [place] [address] (336) 641-8030 --Must be a resident of [place] °-- Must have NO insurance coverage whatsoever (no Medicaid/ Medicare, etc.) °-- The pt. MUST have a primary care doctor that directs their care regularly and follows them in the community °  °[facility]  (866) 331-1348   °[employer]  (888) 892-1162   ° °Agencies that provide inexpensive medical care: °Organization         Address  Phone   Notes  °[facility]  (336) 832-8035   °[facility]    (336) 832-7272   °[facility] [address] °[place], [place] [address] (336) 832-4777   °[facility] [address], °[place] (336) 271-4999   °[facility]    (336) 373-0678   °[facility]    (336) 272-1050   °[facility] ° [address], [place] Phone:  (336) 832-4444, Fax:  (336) 832-4440 Hours of Operation:  9 am - 6 pm, M-F.  Also accepts Medicaid/Medicare and self-pay.  °[facility] ° [address], [place] Phone: (336) [phone], Fax: (336) [phone]. Hours of Operation:  8:30 am - 5:30 pm, M-F.  Also accepts Medicaid and self-pay.  °[facility] [place] [address], [place] Phone: (336) 878-6027   °[facility] [address], [place], [place] (336)723-1848, Ext. 123 Mondays & Thursdays: 7-9 AM.  First 15 patients are seen on a first come, first serve basis. °  ° °Medicaid-accepting [place] Providers: ° °Organization         Address  Phone   Notes  °[facility] [address], [place] (336) 641-2100 Also accepts self-pay patients.  °[facility] [address], [place] ° (336) 856-9996   °[facility] [address], [place]  (336) 288-8857   °[facility] [address] [place] [address], [place] (336) 299-7000   °Veita Bland [address], [place]  ° (336) 373-1557 Only accepts [place] Access Medicaid patients after they have their name applied to their card.  ° °Self-Pay (no insurance) in [place]: ° °Organization         Address  Phone   Notes  °Sickle Cell Patients, [facility] [address], [place] (336) 832-1970   °[facility] [address], [place] (336) 832-4400   °[facility] ° [address], [place] (336) 992-4800   °[facility]/Dr. Osei-Bonsu ° [address] [place] [address], [place] or [address], [place] (336) [phone] Phone number for both [place] and [place] locations is the same.  °[facility] [address], [place] (336) 299-0000   °[facility] [address] [place] [address], [place] or [address] (336) 852-7530 °(336) 878-2260   °[facility] [address], [place] (336) 350-1642, phone; (336) 294-5005, fax Sees patients 1st and 3rd Saturday of every month.  Must not qualify for public or private insurance (i.e. Medicaid, Medicare, [place] Health Choice, Veterans' Benefits) • Household income should be no more than 200% of the poverty level •The clinic cannot treat you if you are pregnant or think you are pregnant • Sexually transmitted diseases are not treated at the clinic.  ° ° °Dental Care: °Organization         Address  Phone  Notes  °[place] [facility] [address], [place] (336) 641-6152 Accepts children up to age 21 who are enrolled in Medicaid or [place] Health Choice; pregnant women with a Medicaid card; and children who have applied for Medicaid or [place] Health Choice, but were declined, whose parents can pay a reduced fee at time of service.  °[place]  [facility] [place]  [address], [place] (336) 641-7733 Accepts children up to age 21 who are enrolled in Medicaid or [place] Health Choice; pregnant women with a Medicaid card; and children who have applied for Medicaid or [place] Health Choice, but were declined, whose parents can pay a reduced fee at time of service.  °[facility] ° [address], [place] (336) 641-4533 Patients are seen by appointment only. Walk-ins are not accepted. [facility] will see patients 18 years of age and older. °Monday - Tuesday (8am-5pm) °Most Wednesdays (8:30-5pm) °$30 per visit, cash only  °[facility] ° [address], [place] (336) 641-4533 Patients are seen by appointment only. Walk-ins are not accepted. [facility] will see patients 18 years of age and older. °One   Wednesday Evening (Monthly: Volunteer Based).  $30 per visit, cash only  °[facility]  (919) [phone] for adults; Children under age 4, call Graduate Pediatric Dentistry at ([phone]) [phone]. Children aged 4-14, please call ([phone]) [phone] to request a pediatric application. ° Dental services are provided in all areas of dental care including fillings, crowns and bridges, complete and partial dentures, implants, gum treatment, root canals, and extractions. Preventive care is also provided. Treatment is provided to both adults and children. °Patients are selected via a lottery and there is often a waiting list. °  °[facility] [address], °[place] ° (336) 763-8833 [email or web address] °  °[facility] [address], [place], [place] (336)723-1848, Ext. 123 Second and Fourth Thursday of each month, opens at 6:30 AM; Clinic ends at 9 AM.  Patients are seen on a first-come first-served basis, and a limited number are seen during each clinic.  ° °[facility] ° [address], [place], [place] (336) [phone]    Eligibility Requirements °You must have lived in [place], [place], or [place] counties for at least the last three months. °  You cannot be eligible for state or federal sponsored healthcare insurance, including Veterans Administration, Medicaid, or Medicare. °  You generally cannot be eligible for healthcare insurance through your employer.  °  How to apply: °Eligibility screenings are held every Tuesday and Wednesday afternoon from 1:00 pm until 4:00 pm. You do not need an appointment for the interview!  °[facility] [address], [place], [place] 336-631-2330   °[facility]  336-342-8273   °[facility]  336-703-3100   °[facility]  336-570-6415   ° °Behavioral Health Resources in the Community: °Intensive Outpatient Programs °Organization         Address  Phone  Notes  °[facility] [address], [place], [place] 336-878-6098   °[facility] [address], [place], [place] 336-832-9800   °[facility] [address], [place], [place] ° 336-882-2125   °[facility] [address],  °[place], [place] 1-800-853-5163 or 336-641-4981   °Substance Abuse Resources °Organization         Address  Phone  Notes  °[facility]  336-882-2125   °[facility]  336-784-9470   °[facility]  336-285-9073   °[facility]  336-845-3988   °Residential & Outpatient Substance Abuse Program  1-800-659-3381   °Psychological Services °Organization         Address  Phone  Notes  °[facility]  336- 832-9600   °[facility]  336- 378-7881   °[facility] [address], [place] 1-800-853-5163 or 336-641-4981   ° °Mobile Crisis Teams °Organization         Address  Phone  Notes  °[facility], Mobile Crisis Care Unit  1-877-626-1772   °Assertive °[facility] ° [address]  [place], [place] 336-834-9664   °Sharon DeEsch [address] ° [place] 336-554-5454   ° °Self-Help/Support Groups °Organization         Address  Phone             Notes  °[facility]  - variety of support groups  336- 373-1402 Call for more information  °Narcotics Anonymous (NA), [facility] [address], °[place]  2 meetings at this location  ° °  Residential Treatment Programs Organization         Address  Phone  Notes  [facility] [address],    [place]  1-317-296-8870   [facility]  [address] [place] [address], [place], [place]   [place], [place] 361-872-6178 Admissions: 8am-3pm M-F  [facility] [place] [address]    [place], [place] 035-009-3818   [facility] [address] [place], [place], [place]   [facility] [address],  [place], [place]   [facility] Intensive Outpatient [address], [place], [place]   [facility] [address]  [place], [place] 1-419-834-4640 or 670-174-0443   [facility] [address], [place], [place] Accepts Medicaid  [facility] [address]  [place] 1-778-427-0278 Substance Abuse/Addiction Treatment   [facility] Organization         Address  Phone  Notes  [facility]  239-573-6863   Domenic Schwab, PhD [address] [place], [place]   810-267-8796 or (564)427-8870   [facility] [place], [place] (934)615-0394   [facility] [address] [place], [place] 7257085202 Insurance/Medicaid/sponsorship through [place] [facility] Families [address]                                    [place], [place] (305)539-4743 [facility] [address], [place] [phone]    Dr. Adele Schilder  (972) 772-6872   [facility] Dept. 1) [address] [place] 2) [place] 3)  [address] [address], [place] [phone] [phone]  (402) [phone]   [place] 938-034-1642 or (601)445-0513 (After Hours)      Take your usual prescriptions as previously directed. Take over the counter tylenol, as directed on packaging, as needed for discomfort. Apply moist heat or ice to the area(s) of discomfort, for 15 minutes at a time, several times per day for the next few days.  Do not fall asleep on a heating or ice pack. Your INR today was 2.02.  Call your regular medical doctor on Monday to schedule a follow up appointment in the next 2 to 3 days.  Return to the Emergency Department immediately if worsening.

## 2014-07-12 DIAGNOSIS — Z7901 Long term (current) use of anticoagulants: Secondary | ICD-10-CM | POA: Diagnosis not present

## 2014-07-12 DIAGNOSIS — S8011XD Contusion of right lower leg, subsequent encounter: Secondary | ICD-10-CM | POA: Diagnosis not present

## 2014-07-13 DIAGNOSIS — N390 Urinary tract infection, site not specified: Secondary | ICD-10-CM | POA: Diagnosis not present

## 2014-07-28 DIAGNOSIS — L03115 Cellulitis of right lower limb: Secondary | ICD-10-CM | POA: Diagnosis not present

## 2014-07-28 DIAGNOSIS — S8011XD Contusion of right lower leg, subsequent encounter: Secondary | ICD-10-CM | POA: Diagnosis not present

## 2014-07-28 DIAGNOSIS — Z7952 Long term (current) use of systemic steroids: Secondary | ICD-10-CM | POA: Diagnosis not present

## 2014-07-28 DIAGNOSIS — Z7901 Long term (current) use of anticoagulants: Secondary | ICD-10-CM | POA: Diagnosis not present

## 2014-08-03 DIAGNOSIS — Z7901 Long term (current) use of anticoagulants: Secondary | ICD-10-CM | POA: Diagnosis not present

## 2014-08-11 DIAGNOSIS — L03115 Cellulitis of right lower limb: Secondary | ICD-10-CM | POA: Diagnosis not present

## 2014-08-11 DIAGNOSIS — Z7901 Long term (current) use of anticoagulants: Secondary | ICD-10-CM | POA: Diagnosis not present

## 2014-08-11 DIAGNOSIS — S8011XD Contusion of right lower leg, subsequent encounter: Secondary | ICD-10-CM | POA: Diagnosis not present

## 2014-08-11 DIAGNOSIS — M1712 Unilateral primary osteoarthritis, left knee: Secondary | ICD-10-CM | POA: Diagnosis not present

## 2014-09-13 DIAGNOSIS — Z7901 Long term (current) use of anticoagulants: Secondary | ICD-10-CM | POA: Diagnosis not present

## 2014-09-14 DIAGNOSIS — M25562 Pain in left knee: Secondary | ICD-10-CM | POA: Diagnosis not present

## 2014-09-14 DIAGNOSIS — M1712 Unilateral primary osteoarthritis, left knee: Secondary | ICD-10-CM | POA: Diagnosis not present

## 2014-10-01 ENCOUNTER — Emergency Department (HOSPITAL_COMMUNITY): Payer: Commercial Managed Care - HMO

## 2014-10-01 ENCOUNTER — Inpatient Hospital Stay (HOSPITAL_COMMUNITY)
Admission: EM | Admit: 2014-10-01 | Discharge: 2014-10-14 | DRG: 234 | Disposition: A | Discharge status: Skilled Nursing Facility | Payer: Commercial Managed Care - HMO | Attending: Thoracic Surgery (Cardiothoracic Vascular Surgery) | Admitting: Thoracic Surgery (Cardiothoracic Vascular Surgery)

## 2014-10-01 ENCOUNTER — Encounter (HOSPITAL_COMMUNITY): Payer: Self-pay | Admitting: *Deleted

## 2014-10-01 ENCOUNTER — Observation Stay (HOSPITAL_COMMUNITY): Payer: Commercial Managed Care - HMO

## 2014-10-01 DIAGNOSIS — Z01811 Encounter for preprocedural respiratory examination: Secondary | ICD-10-CM

## 2014-10-01 DIAGNOSIS — R748 Abnormal levels of other serum enzymes: Secondary | ICD-10-CM | POA: Diagnosis not present

## 2014-10-01 DIAGNOSIS — Z7952 Long term (current) use of systemic steroids: Secondary | ICD-10-CM

## 2014-10-01 DIAGNOSIS — E119 Type 2 diabetes mellitus without complications: Secondary | ICD-10-CM | POA: Diagnosis present

## 2014-10-01 DIAGNOSIS — D689 Coagulation defect, unspecified: Secondary | ICD-10-CM | POA: Diagnosis not present

## 2014-10-01 DIAGNOSIS — I25110 Atherosclerotic heart disease of native coronary artery with unstable angina pectoris: Secondary | ICD-10-CM | POA: Diagnosis not present

## 2014-10-01 DIAGNOSIS — J90 Pleural effusion, not elsewhere classified: Secondary | ICD-10-CM | POA: Diagnosis not present

## 2014-10-01 DIAGNOSIS — I252 Old myocardial infarction: Secondary | ICD-10-CM | POA: Diagnosis not present

## 2014-10-01 DIAGNOSIS — Z01810 Encounter for preprocedural cardiovascular examination: Secondary | ICD-10-CM | POA: Diagnosis not present

## 2014-10-01 DIAGNOSIS — J9811 Atelectasis: Secondary | ICD-10-CM | POA: Diagnosis not present

## 2014-10-01 DIAGNOSIS — I25710 Atherosclerosis of autologous vein coronary artery bypass graft(s) with unstable angina pectoris: Secondary | ICD-10-CM | POA: Diagnosis not present

## 2014-10-01 DIAGNOSIS — N390 Urinary tract infection, site not specified: Secondary | ICD-10-CM | POA: Diagnosis not present

## 2014-10-01 DIAGNOSIS — E8770 Fluid overload, unspecified: Secondary | ICD-10-CM | POA: Diagnosis not present

## 2014-10-01 DIAGNOSIS — Z951 Presence of aortocoronary bypass graft: Secondary | ICD-10-CM | POA: Diagnosis not present

## 2014-10-01 DIAGNOSIS — I340 Nonrheumatic mitral (valve) insufficiency: Secondary | ICD-10-CM | POA: Diagnosis present

## 2014-10-01 DIAGNOSIS — I2510 Atherosclerotic heart disease of native coronary artery without angina pectoris: Secondary | ICD-10-CM

## 2014-10-01 DIAGNOSIS — Z87891 Personal history of nicotine dependence: Secondary | ICD-10-CM | POA: Diagnosis not present

## 2014-10-01 DIAGNOSIS — R791 Abnormal coagulation profile: Secondary | ICD-10-CM | POA: Diagnosis not present

## 2014-10-01 DIAGNOSIS — Z7901 Long term (current) use of anticoagulants: Secondary | ICD-10-CM | POA: Diagnosis not present

## 2014-10-01 DIAGNOSIS — R918 Other nonspecific abnormal finding of lung field: Secondary | ICD-10-CM | POA: Diagnosis not present

## 2014-10-01 DIAGNOSIS — R0789 Other chest pain: Secondary | ICD-10-CM | POA: Diagnosis not present

## 2014-10-01 DIAGNOSIS — Z8673 Personal history of transient ischemic attack (TIA), and cerebral infarction without residual deficits: Secondary | ICD-10-CM | POA: Diagnosis not present

## 2014-10-01 DIAGNOSIS — M6281 Muscle weakness (generalized): Secondary | ICD-10-CM | POA: Diagnosis not present

## 2014-10-01 DIAGNOSIS — D696 Thrombocytopenia, unspecified: Secondary | ICD-10-CM | POA: Diagnosis present

## 2014-10-01 DIAGNOSIS — Z8612 Personal history of poliomyelitis: Secondary | ICD-10-CM

## 2014-10-01 DIAGNOSIS — R079 Chest pain, unspecified: Secondary | ICD-10-CM | POA: Diagnosis not present

## 2014-10-01 DIAGNOSIS — E785 Hyperlipidemia, unspecified: Secondary | ICD-10-CM | POA: Diagnosis present

## 2014-10-01 DIAGNOSIS — R278 Other lack of coordination: Secondary | ICD-10-CM | POA: Diagnosis not present

## 2014-10-01 DIAGNOSIS — D62 Acute posthemorrhagic anemia: Secondary | ICD-10-CM | POA: Diagnosis not present

## 2014-10-01 DIAGNOSIS — I480 Paroxysmal atrial fibrillation: Secondary | ICD-10-CM | POA: Diagnosis present

## 2014-10-01 DIAGNOSIS — I200 Unstable angina: Secondary | ICD-10-CM | POA: Diagnosis not present

## 2014-10-01 DIAGNOSIS — R262 Difficulty in walking, not elsewhere classified: Secondary | ICD-10-CM | POA: Diagnosis not present

## 2014-10-01 DIAGNOSIS — D72829 Elevated white blood cell count, unspecified: Secondary | ICD-10-CM | POA: Diagnosis present

## 2014-10-01 DIAGNOSIS — I10 Essential (primary) hypertension: Secondary | ICD-10-CM | POA: Diagnosis not present

## 2014-10-01 DIAGNOSIS — I739 Peripheral vascular disease, unspecified: Secondary | ICD-10-CM

## 2014-10-01 DIAGNOSIS — M1990 Unspecified osteoarthritis, unspecified site: Secondary | ICD-10-CM | POA: Diagnosis not present

## 2014-10-01 DIAGNOSIS — M89661 Osteopathy after poliomyelitis, right lower leg: Secondary | ICD-10-CM | POA: Diagnosis not present

## 2014-10-01 HISTORY — DX: Essential (primary) hypertension: I10

## 2014-10-01 HISTORY — DX: Transient cerebral ischemic attack, unspecified: G45.9

## 2014-10-01 LAB — TROPONIN I
Troponin I: <0.03 ng/mL (ref <0.031)
Troponin I: <0.03 ng/mL (ref <0.031)

## 2014-10-01 LAB — CBC
HEMATOCRIT: 38.6 % — AB (ref 39.0–52.0)
HEMOGLOBIN: 12.6 g/dL — AB (ref 13.0–17.0)
MCH: 30.3 pg (ref 26.0–34.0)
MCHC: 32.6 g/dL (ref 30.0–36.0)
MCV: 92.8 fL (ref 78.0–100.0)
PLATELETS: 123 10*3/uL — AB (ref 150–400)
RBC: 4.16 MIL/uL — AB (ref 4.22–5.81)
RDW: 13.9 % (ref 11.5–15.5)
WBC: 10.6 10*3/uL — ABNORMAL HIGH (ref 4.0–10.5)

## 2014-10-01 LAB — TSH: TSH: 1.668 u[IU]/mL (ref 0.350–4.500)

## 2014-10-01 LAB — LIPID PANEL
Cholesterol: 155 mg/dL (ref 0–200)
HDL: 66 mg/dL (ref >40)
LDL CALC: 54 mg/dL (ref 0–99)
Total CHOL/HDL Ratio: 2.3 RATIO
Triglycerides: 176 mg/dL — ABNORMAL HIGH (ref <150)
VLDL: 35 mg/dL (ref 0–40)

## 2014-10-01 LAB — BASIC METABOLIC PANEL
Anion gap: 7 (ref 5–15)
BUN: 33 mg/dL — ABNORMAL HIGH (ref 6–20)
CALCIUM: 9.1 mg/dL (ref 8.9–10.3)
CHLORIDE: 98 mmol/L — AB (ref 101–111)
CO2: 32 mmol/L (ref 22–32)
Creatinine, Ser: 0.94 mg/dL (ref 0.61–1.24)
GLUCOSE: 134 mg/dL — AB (ref 65–99)
Potassium: 4.5 mmol/L (ref 3.5–5.1)
Sodium: 137 mmol/L (ref 135–145)

## 2014-10-01 LAB — PROTIME-INR
INR: 8.12 (ref 0.00–1.49)
PROTHROMBIN TIME: 64.7 s — AB (ref 11.6–15.2)

## 2014-10-01 LAB — APTT: aPTT: 50 seconds — ABNORMAL HIGH (ref 24–37)

## 2014-10-01 MED ORDER — NITROGLYCERIN 2 % TD OINT
1.0000 [in_us] | TOPICAL_OINTMENT | Freq: Once | TRANSDERMAL | Status: AC
  Administered 2014-10-01: 1 [in_us] via TOPICAL

## 2014-10-01 MED ORDER — SODIUM CHLORIDE 0.9 % IJ SOLN: 3.0000 mL | INTRAMUSCULAR | Status: DC | PRN

## 2014-10-01 MED ORDER — MORPHINE SULFATE 2 MG/ML IJ SOLN: 1.0000 mg | INTRAMUSCULAR | Status: DC | PRN

## 2014-10-01 MED ORDER — SODIUM CHLORIDE 0.9 % IJ SOLN
3.0000 mL | Freq: Two times a day (BID) | INTRAMUSCULAR | Status: DC
  Administered 2014-10-01 – 2014-10-06 (×5): 3 mL via INTRAVENOUS

## 2014-10-01 MED ORDER — ACETAMINOPHEN 650 MG RE SUPP: 650.0000 mg | Freq: Four times a day (QID) | RECTAL | Status: DC | PRN

## 2014-10-01 MED ORDER — ONDANSETRON HCL 4 MG PO TABS: 4.0000 mg | ORAL_TABLET | Freq: Four times a day (QID) | ORAL | Status: DC | PRN

## 2014-10-01 MED ORDER — ALUM & MAG HYDROXIDE-SIMETH 200-200-20 MG/5ML PO SUSP: 30.0000 mL | Freq: Four times a day (QID) | ORAL | Status: DC | PRN

## 2014-10-01 MED ORDER — ONDANSETRON HCL 4 MG/2ML IJ SOLN: 4.0000 mg | Freq: Four times a day (QID) | INTRAMUSCULAR | Status: DC | PRN

## 2014-10-01 MED ORDER — SODIUM CHLORIDE 0.9 % IV SOLN: 250.0000 mL | INTRAVENOUS | Status: DC | PRN

## 2014-10-01 MED ORDER — SODIUM CHLORIDE 0.9 % IJ SOLN
3.0000 mL | Freq: Two times a day (BID) | INTRAMUSCULAR | Status: DC
  Administered 2014-10-01 – 2014-10-03 (×4): 3 mL via INTRAVENOUS

## 2014-10-01 MED ORDER — PREDNISONE 20 MG PO TABS
30.0000 mg | ORAL_TABLET | Freq: Every morning | ORAL | Status: DC
  Administered 2014-10-01 – 2014-10-02 (×2): 30 mg via ORAL
  Filled 2014-10-01 (×4): qty 1

## 2014-10-01 MED ORDER — TRAZODONE 25 MG HALF TABLET
25.0000 mg | ORAL_TABLET | Freq: Every evening | ORAL | Status: DC | PRN
  Administered 2014-10-05: 25 mg via ORAL
  Filled 2014-10-01 (×2): qty 1

## 2014-10-01 MED ORDER — METOPROLOL TARTRATE 12.5 MG HALF TABLET
12.5000 mg | ORAL_TABLET | Freq: Two times a day (BID) | ORAL | Status: DC
  Administered 2014-10-01 – 2014-10-05 (×9): 12.5 mg via ORAL
  Filled 2014-10-01 (×9): qty 1

## 2014-10-01 MED ORDER — SODIUM CHLORIDE 0.9 % IV SOLN
1000.0000 mL | INTRAVENOUS | Status: DC
  Administered 2014-10-01: 1000 mL via INTRAVENOUS
  Administered 2014-10-05: 250 mL via INTRAVENOUS
  Administered 2014-10-07: 1000 mL via INTRAVENOUS

## 2014-10-01 MED ORDER — HYDROCODONE-ACETAMINOPHEN 5-325 MG PO TABS: 1.0000 | ORAL_TABLET | ORAL | Status: DC | PRN

## 2014-10-01 MED ORDER — ATORVASTATIN CALCIUM 40 MG PO TABS
40.0000 mg | ORAL_TABLET | Freq: Every day | ORAL | Status: DC
  Administered 2014-10-01 – 2014-10-04 (×4): 40 mg via ORAL
  Filled 2014-10-01 (×4): qty 1

## 2014-10-01 MED ORDER — DOCUSATE SODIUM 100 MG PO CAPS
100.0000 mg | ORAL_CAPSULE | Freq: Two times a day (BID) | ORAL | Status: DC
  Administered 2014-10-01 – 2014-10-08 (×15): 100 mg via ORAL
  Filled 2014-10-01 (×15): qty 1

## 2014-10-01 MED ORDER — ASPIRIN 81 MG PO CHEW
324.0000 mg | CHEWABLE_TABLET | Freq: Once | ORAL | Status: AC
  Administered 2014-10-01: 324 mg via ORAL
  Filled 2014-10-01: qty 4

## 2014-10-01 MED ORDER — BISACODYL 10 MG RE SUPP: 10.0000 mg | Freq: Every day | RECTAL | Status: DC | PRN

## 2014-10-01 MED ORDER — ACETAMINOPHEN 325 MG PO TABS: 650.0000 mg | ORAL_TABLET | Freq: Four times a day (QID) | ORAL | Status: DC | PRN

## 2014-10-01 NOTE — ED Provider Notes (Signed)
CSN: [id number]     Arrival date & time 10/01/14  0903 History  This chart was scribed for Dorie Rank, MD by Rayna Sexton, ED scribe. This patient was seen in room APA02/APA02 and the patient's care was started at 9:25 AM.    Chief Complaint  Patient presents with  . Chest Pain   The history is provided by the patient. No language interpreter was used.    HPI Comments: Benjamin Cordova is a 80 y.o. male, with a history of CAD, HTN and MI, who presents to the Emergency Department complaining of constant, moderate, CP with onset 2 hours ago. Pt notes that the symptoms have mostly alleviated and would describe the initial pain as a "tightness". He notes sweating as an associated symptom. Pt further notes that he has had previous occurences of chest tightness when taking out the trash but hasn't seen a provider for these. He denies any radiation of the pain and further notes his last MI was in 1980 with no reoccurrences since then. He notes taking 10 mg of Coumadin daily since having a TIA in 1995. He also notes a history of polio in 1951 which is the cause of his right LE weakness. He denies nausea, vomiting, fever, weakness, HA, and coughing.  Past Medical History  Diagnosis Date  . Coronary artery disease   . Hypertension   . Myocardial infarction 1980  . Arthritis     HANDS/KNEES  . Hematuria   . Bladder infection, acute   . Peripheral vascular disease   . Polio osteopathy of lower leg AGE 68    RIGHT   Past Surgical History  Procedure Laterality Date  . Tonsillectomy  age 65    [facility]  . Foot surgery  1954    [facility], [facility]  . Coronary artery bypass graft  [place]  . Cystoscopy with injection  10/10/2010    Procedure: CYSTOSCOPY WITH INJECTION;  Surgeon: Marissa Nestle;  Location: [facility];  Service: Urology;  Laterality: N/A;  with retrograde urethrogram   Family History  Problem Relation Age of Onset  . Anesthesia problems Neg Hx   .  Hypotension Neg Hx   . Malignant hyperthermia Neg Hx   . Pseudochol deficiency Neg Hx    History  Substance Use Topics  . Smoking status: Former Smoker -- 28 years    Quit date: 12/02/1978  . Smokeless tobacco: Never Used  . Alcohol Use: No    Review of Systems  Constitutional: Negative for chills.  Respiratory: Positive for chest tightness.   All other systems reviewed and are negative.     Allergies  Review of patient's allergies indicates no known allergies.  Home Medications   Prior to Admission medications   Medication Sig Start Date End Date Taking? Authorizing Provider  acidophilus (RISAQUAD) CAPS capsule Take 1 capsule by mouth daily.   Yes Historical Provider, MD  atenolol (TENORMIN) 100 MG tablet Take 50 mg by mouth daily.     Yes Historical Provider, MD  B Complex-C-Folic Acid (SUPER B-COMPLEX/VIT C/FA) TABS Take 1 tablet by mouth daily.     Yes Historical Provider, MD  folic acid (FOLVITE) 409 MCG tablet Take 800 mcg by mouth daily.    Yes Historical Provider, MD  hydrochlorothiazide 25 MG tablet Take 12.5 mg by mouth daily.     Yes Historical Provider, MD  lisinopril (PRINIVIL,ZESTRIL) 20 MG tablet Take 10 mg by mouth every morning. 06/25/14  Yes  Historical Provider, MD  Multiple Vitamin (MULTIVITAMIN PO) Take 1 tablet by mouth daily.     Yes Historical Provider, MD  naproxen sodium (ALEVE) 220 MG tablet Take 440 mg by mouth daily as needed (for pain).    Yes Historical Provider, MD  predniSONE (DELTASONE) 10 MG tablet Take 30 mg by mouth every morning. 06/29/14  Yes Historical Provider, MD  simvastatin (ZOCOR) 80 MG tablet Take 40 mg by mouth at bedtime. 06/25/14  Yes Historical Provider, MD  vitamin C (ASCORBIC ACID) 500 MG tablet Take 500 mg by mouth daily.     Yes Historical Provider, MD  warfarin (COUMADIN) 10 MG tablet Take 10 mg by mouth daily.    Yes Historical Provider, MD   BP 102/66 mmHg  Pulse 69  Temp(Src) 98 F (36.7 C) (Oral)  Resp 13  Ht 5\' 11"   (1.803 m)  Wt 227 lb (102.967 kg)  BMI 31.67 kg/m2  SpO2 98% Physical Exam  Constitutional: No distress.  HENT:  Head: Normocephalic and atraumatic.  Right Ear: External ear normal.  Left Ear: External ear normal.  Eyes: Conjunctivae are normal. Right eye exhibits no discharge. Left eye exhibits no discharge. No scleral icterus.  Neck: Neck supple. No tracheal deviation present.  Cardiovascular: Normal rate, regular rhythm and intact distal pulses.   Pulmonary/Chest: Effort normal and breath sounds normal. No stridor. No respiratory distress. He has no wheezes. He has no rales.  Abdominal: Soft. Bowel sounds are normal. He exhibits no distension. There is no tenderness. There is no rebound and no guarding.  Musculoskeletal: He exhibits no edema or tenderness.  Atrophy of the right LE;   Neurological: He is alert. He has normal strength. No cranial nerve deficit (no facial droop, extraocular movements intact, no slurred speech) or sensory deficit. He exhibits normal muscle tone. He displays no seizure activity. Coordination normal.  Skin: Skin is warm and dry. No rash noted. He is not diaphoretic.  Psychiatric: He has a normal mood and affect.  Nursing note and vitals reviewed.   ED Course  Procedures  DIAGNOSTIC STUDIES: Oxygen Saturation is 98% on RA, normal by my interpretation.    COORDINATION OF CARE: 9:29 AM Discussed treatment plan with pt at bedside and pt agreed to plan.  Labs Review Labs Reviewed  CBC - Abnormal; Notable for the following:    WBC 10.6 (*)    RBC 4.16 (*)    Hemoglobin 12.6 (*)    HCT 38.6 (*)    Platelets 123 (*)    All other components within normal limits  BASIC METABOLIC PANEL - Abnormal; Notable for the following:    Chloride 98 (*)    Glucose, Bld 134 (*)    BUN 33 (*)    All other components within normal limits  APTT - Abnormal; Notable for the following:    aPTT 50 (*)    All other components within normal limits  PROTIME-INR -  Abnormal; Notable for the following:    Prothrombin Time 64.7 (*)    INR 8.12 (*)    All other components within normal limits  TROPONIN I  TROPONIN I  TROPONIN I    Imaging Review Dg Chest Port 1 View  10/01/2014   CLINICAL DATA:  Mid sternal region chest pain for 1 day  EXAM: PORTABLE CHEST - 1 VIEW  COMPARISON:  None.  FINDINGS: There is no edema or consolidation. Heart size and pulmonary vascularity are normal. Patient is status post median sternotomy. No adenopathy.  No bone lesions.  IMPRESSION: No edema or consolidation.   Electronically Signed   By: Lowella Grip III M.D.   On: 10/01/2014 09:29     EKG Interpretation   Date/Time:  Friday October 01 2014 09:05:45 EDT Ventricular Rate:  83 PR Interval:  148 QRS Duration: 100 QT Interval:  344 QTC Calculation: 404 R Axis:   62 Text Interpretation:  Sinus rhythm Ventricular premature complex  Anterolateral infarct, age indeterminate No significant change since last  tracing Confirmed by Krisanne Lich  MD-J, Quindon Denker ([id number]) on 10/01/2014 9:11:32 AM      MDM   Final diagnoses:  Chest pain, unspecified chest pain type   The patient presents to the emergency room with symptoms concerning for recurrence of his coronary artery disease. The patient describes recent chest pain with exertion. This morning he had an episode of substernal chest pressure at rest. Symptoms have all resolved. EKG is not showing any signs of acute ischemia. Troponin is normal. I will consult the medical service for admission for serial cardiac enzymes and further evaluation.  INR is supratherapeutic. No evidence of bleeding. We'll have him hold his Coumadin.  I personally performed the services described in this documentation, which was scribed in my presence.  The recorded information has been reviewed and is accurate.    Dorie Rank, MD 10/01/14 1130

## 2014-10-01 NOTE — ED Notes (Addendum)
Per EMS pt reported substernal chest pressure this morning that started 1 hour ago, pt has had quadruple bypass in 1981. Denies any other symptoms at this time. Pain was 7/10 took 2 ibuprofen and acid reflux med, pain now 1/10. Pt did not take asa, pt states he is on coumadin.

## 2014-10-01 NOTE — ED Notes (Signed)
MD Hillard Danker at bedside.

## 2014-10-01 NOTE — H&P (Signed)
[facility] History and Physical  Benjamin Cordova:[id number] DOB: Dec 12, 1934 DOA: 10/01/2014  Referring physician: Tomi Bamberger PCP: Robert Bellow, MD   Chief Complaint: chest pain  HPI: Benjamin Cordova is a very pleasant 80 y.o. male with past medical hx of cad, HTN and MI remotely, polio, tia on coumadin presents to the emergency Department chief complaint chest pain. Patient's cardiac history and presentation likely indication for need of cardiac cath however INR supra therapeutic.  Patient reports he awakened this morning and his usual time and noticed chest pain. Describes the pain as a tightness and pressure located substernal. Denies radiation. Associated symptoms include diaphoresis and mild nausea. He denies headache visual disturbances numbness or tingling of extremities. He denies shortness of breath. He states he took antiacid and Advil with no relief. After 1 hour he called EMT. He was brought to emergency room and given nitroglycerin ointment and he reports pain decreased at that time. Initial pain rated an 8 out of 10 pain at the time of my exam 4 out of 10. Of note patient reports being in his usual state of health over the last several weeks with the exception of 45 episodes of chest pain with exertion accompanied by diaphoresis and shortness of breath. He reports "resting for 5 minutes" and the episode would subside. He also reports his last MI was in 1980 and he's had no recurrences. He reports his last cath 12 years ago results were "normal". He is on Coumadin since 1995 for TIA he has his INR checked monthly with the last check being done middle of June.  He denies any recent fever chills cough abdominal pain nausea vomiting diarrhea constipation. He denies dysuria hematuria frequency or urgency. He does report some pain in his left knee somewhat chronic since this Ernie Hew and is currently on prednisone for "water on the knee". Workup in the emergency department reveals  leukocytosis of 10.6 hemoglobin of 12.6 platelets 123 chloride 98 INR 8.12 serum glucose 134. Initial troponin negative. Chest x-ray without active cardiopulmonary process. EKG with sinus rhythm occasional PVC He is hemodynamically stable and not hypoxic. He is given nitroglycerin ointment and IV fluids in the emergency department.    Review of Systems:  10 point review of systems completed and all systems are negative except as indicated in the history of present illness   Past Medical History  Diagnosis Date  . Coronary artery disease     Multivessel s/p CABG [facility] at [facility]  . Essential hypertension   . Myocardial infarction 1980  . Arthritis   . Hematuria   . Bladder infection, acute   . Peripheral vascular disease   . Polio osteopathy of lower leg Age 75    Affected right leg  . TIA (transient ischemic attack)     Chronic coumadin   Past Surgical History  Procedure Laterality Date  . Tonsillectomy  age 75    [facility]  . Foot surgery  1954    [facility], muscle implantation  . Coronary artery bypass graft  1981    [facility]  . Cystoscopy with injection  10/10/2010    Procedure: CYSTOSCOPY WITH INJECTION;  Surgeon: Marissa Nestle;  Location: [facility];  Service: Urology;  Laterality: N/A;  with retrograde urethrogram   Social History:  reports that he quit smoking about 35 years ago. His smoking use included Cigarettes. He quit after 28 years of use. He has never used smokeless tobacco. He reports that he does not drink alcohol or  use illicit drugs. He lives at home. 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 Anesthesia problems Neg Hx   . Hypotension Neg Hx   . Malignant hyperthermia Neg Hx   . Pseudochol deficiency Neg Hx     Prior to Admission medications   Medication Sig Start Date End Date Taking? Authorizing Provider  acidophilus (RISAQUAD) CAPS capsule  Take 1 capsule by mouth daily.   Yes Historical Provider, MD  atenolol (TENORMIN) 100 MG tablet Take 50 mg by mouth daily.     Yes Historical Provider, MD  B Complex-C-Folic Acid (SUPER B-COMPLEX/VIT C/FA) TABS Take 1 tablet by mouth daily.     Yes Historical Provider, MD  folic acid (FOLVITE) 660 MCG tablet Take 800 mcg by mouth daily.    Yes Historical Provider, MD  hydrochlorothiazide 25 MG tablet Take 12.5 mg by mouth daily.     Yes Historical Provider, MD  lisinopril (PRINIVIL,ZESTRIL) 20 MG tablet Take 10 mg by mouth every morning. 06/25/14  Yes Historical Provider, MD  Multiple Vitamin (MULTIVITAMIN PO) Take 1 tablet by mouth daily.     Yes Historical Provider, MD  naproxen sodium (ALEVE) 220 MG tablet Take 440 mg by mouth daily as needed (for pain).    Yes Historical Provider, MD  predniSONE (DELTASONE) 10 MG tablet Take 30 mg by mouth every morning. 06/29/14  Yes Historical Provider, MD  simvastatin (ZOCOR) 80 MG tablet Take 40 mg by mouth at bedtime. 06/25/14  Yes Historical Provider, MD  vitamin C (ASCORBIC ACID) 500 MG tablet Take 500 mg by mouth daily.     Yes Historical Provider, MD  warfarin (COUMADIN) 10 MG tablet Take 10 mg by mouth daily.    Yes Historical Provider, MD   Physical Exam: Filed Vitals:   10/01/14 0909 10/01/14 1000 10/01/14 1100 10/01/14 1130  BP: 129/60 99/61 102/66 118/71  Pulse: 83 77 69 70  Temp: 98 F (36.7 C)     TempSrc: Oral     Resp: 16 20 13 18   Height: 5\' 11"  (1.803 m)     Weight: 102.967 kg (227 lb)     SpO2: 98% 96% 98% 96%    Wt Readings from Last 3 Encounters:  10/01/14 102.967 kg (227 lb)  07/03/14 102.967 kg (227 lb)  09/21/11 102.059 kg (225 lb)    General:  Appears calm and comfortable Eyes: PERRL, normal lids, irises & conjunctiva ENT: grossly normal hearing, lips & tongue Neck: no LAD, masses or thyromegaly Cardiovascular: RRR, no m/r/g. Trace to 1+ pitting edema particularly on the right Telemetry: SR, no arrhythmias   Respiratory: CTA bilaterally, no w/r/r. Normal respiratory effort. Abdomen: soft, ntnd positive bowel sounds Skin: no rash or induration seen on limited exam Musculoskeletal: grossly normal tone BUE/BLE right leg smaller than left leg Psychiatric: grossly normal mood and affect, speech fluent and appropriate Neurologic: grossly non-focal. Speech clear facial symmetry           Labs on Admission:  Basic Metabolic Panel:  Recent Labs Lab 10/01/14 0949  NA 137  K 4.5  CL 98*  CO2 32  GLUCOSE 134*  BUN 33*  CREATININE 0.94  CALCIUM 9.1   Liver Function Tests: No results for input(s): AST, ALT, ALKPHOS, BILITOT, PROT, ALBUMIN in the last 168 hours. No results for input(s): LIPASE, AMYLASE in the last 168 hours. No results for input(s): AMMONIA  in the last 168 hours. CBC:  Recent Labs Lab 10/01/14 0949  WBC 10.6*  HGB 12.6*  HCT 38.6*  MCV 92.8  PLT 123*   Cardiac Enzymes:  Recent Labs Lab 10/01/14 0949  TROPONINI <0.03    BNP (last 3 results) No results for input(s): BNP in the last 8760 hours.  ProBNP (last 3 results) No results for input(s): PROBNP in the last 8760 hours.  CBG: No results for input(s): GLUCAP in the last 168 hours.  Radiological Exams on Admission: Dg Chest Port 1 View  10/01/2014   CLINICAL DATA:  Mid sternal region chest pain for 1 day  EXAM: PORTABLE CHEST - 1 VIEW  COMPARISON:  None.  FINDINGS: There is no edema or consolidation. Heart size and pulmonary vascularity are normal. Patient is status post median sternotomy. No adenopathy. No bone lesions.  IMPRESSION: No edema or consolidation.   Electronically Signed   By: Lowella Grip III M.D.   On: 10/01/2014 09:29    EKG: Independently reviewed normal sinus rhythm  Assessment/Plan Principal Problem:   Chest pain: Patient with history of CABG in [place] in 1980s. Release from cardiology care many years ago. Current chest pain resolved with nitroglycerin provided in the emergency  department. Initial troponin negative EKG with normal sinus rhythm. Will admit to rule out. We'll cycle enzymes get serial EKGs. Will obtain lipid panel echocardiogram as he does have some lower extremity edema. Denies orthopnea or shortness of breath. Likely need cardiac cath however INR of 8 precludes this being done until that resolved. Continue statin. Have requested cardiology consult. Active Problems:    Elevated INR: Reports monthly checks of his INR last 1 done the middle of June. He has started prednisone for left knee pain since his last check. Reports INR "okay" in June. No signs of active bleeding. Will hold Coumadin. Consider reversing.    Leukocytosis; patient has been on prednisone for left knee pain for 2 weeks. He is afebrile and not hypoxic. Chest x-ray without infiltrate urinalysis unremarkable    Thrombocytopenia: May be related to above. Holding Coumadin for now. He is on prednisone we'll continue this.   Code Status: full DVT Prophylaxis: Family Communication: son at bedside Disposition Plan: home when ready  Time spent: 12 minutes  [facility] Pager [phone]

## 2014-10-01 NOTE — Consult Note (Signed)
Primary care provider: Dr. Leslie Andrea Primary cardiologist: Previously Dr. Coralie Keens Consulting cardiologist: Dr. Satira Sark  Reason for consultation: Unstable angina  Clinical Summary Benjamin Cordova is a 80 y.o.male with past medical history outlined below, presented to the ER today after waking up this morning with chest pressure. He thought that it was indigestion at first, began to feel diaphoretic. He did take Tums and an NSAID, but had no relief. Symptoms worsened and he came to the ER where nitroglycerin paste helped relieve his symptoms.  He tells me that over the last few months he has been experiencing recurrent exertional angina with typical ADLs, working outside, pushing his trashcan to street. He reports no change in his medical regimen.  Cardiac history reviewed, he has had no regular cardiology follow-up for several years, previously saw Dr. Coralie Keens. He underwent CABG at [place] back in 1981. 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  Initial troponin I levels are normal. ECG shows sinus rhythm with nonspecific ST changes, PVC, and evidence of previous anterior infarct.   He has been on long-term Coumadin, followed by Dr. Karie Kirks with previous history of TIA. INR is supratherapeutic at 8.1 today.   No Known Allergies  Medications Scheduled Medications: . atorvastatin  40 mg Oral q1800  . docusate sodium  100 mg Oral BID  . predniSONE  30 mg Oral q morning - 10a  . sodium chloride  3 mL Intravenous Q12H  . sodium chloride  3 mL Intravenous Q12H    Infusions: . sodium chloride 1,000 mL (10/01/14 0940)    PRN Medications: sodium chloride, acetaminophen **OR** acetaminophen, alum & mag hydroxide-simeth, bisacodyl, HYDROcodone-acetaminophen, morphine injection, ondansetron **OR** ondansetron (ZOFRAN) IV, sodium chloride, traZODone   Past Medical  History  Diagnosis Date  . Coronary artery disease     Multivessel s/p CABG [facility]  . Essential hypertension   . Myocardial infarction 1980  . Arthritis   . Hematuria   . Bladder infection, acute   . Peripheral vascular disease   . Polio osteopathy of lower leg Age 40    Affected right leg  . TIA (transient ischemic attack)     Chronic coumadin    Past Surgical History  Procedure Laterality Date  . Tonsillectomy  age 60    [facility]  . Foot surgery  1954    [facility], muscle implantation  . Coronary artery bypass graft  1981    [facility]  . Cystoscopy with injection  10/10/2010    Procedure: CYSTOSCOPY WITH INJECTION;  Surgeon: Marissa Nestle;  Location: [facility];  Service: Urology;  Laterality: N/A;  with retrograde urethrogram    Family History  Problem Relation Age of Onset  . Anesthesia problems Neg Hx   . Hypotension Neg Hx   . Malignant hyperthermia Neg Hx   . Pseudochol deficiency Neg Hx     Social History Benjamin Cordova reports that he quit smoking about 35 years ago. His smoking use included Cigarettes. He quit after 28 years of use. He has never used smokeless tobacco. Benjamin Cordova reports that he does not drink alcohol.  Review of Systems Complete review of systems negative except as otherwise outlined in the clinical summary and also the following. NYHA class II dyspnea. No palpitations or syncope. No reported active bleeding issues.  Physical Examination Blood pressure 116/60, pulse 85, temperature 97.9  F (36.6 C), temperature source Oral, resp. rate 18, height 5\' 11"  (1.803 m), weight 225 lb (102.059 kg), SpO2 100 %. No intake or output data in the 24 hours ending 10/01/14 1527  Telemetry: Sinus rhythm with PVCs.  Gen.: No acute distress, no chest pain at present. HEENT: Conjunctiva and lids normal, oropharynx clear. Neck: Supple, no elevated JVP or carotid bruits, no thyromegaly. Lungs: Clear to auscultation, nonlabored breathing at rest. Cardiac:  Regular rate and rhythm, no S3, soft systolic murmur, no pericardial rub. Abdomen: Soft, nontender, bowel sounds present, no guarding or rebound. Extremities: Mild edema, distal pulses 2+. Leg asymmetry with history of polio, right smaller than left Skin: Warm and dry. Musculoskeletal: No kyphosis. Neuropsychiatric: Alert and oriented x3, affect grossly appropriate.   Lab Results  Basic Metabolic Panel:  Recent Labs Lab 10/01/14 0949  NA 137  K 4.5  CL 98*  CO2 32  GLUCOSE 134*  BUN 33*  CREATININE 0.94  CALCIUM 9.1    CBC:  Recent Labs Lab 10/01/14 0949  WBC 10.6*  HGB 12.6*  HCT 38.6*  MCV 92.8  PLT 123*    Cardiac Enzymes:  Recent Labs Lab 10/01/14 0949 10/01/14 1233  TROPONINI <0.03 <0.03    Imaging Echocardiogram 10/01/2014: Study Conclusions  - Left ventricle: The cavity size was normal. Wall thickness was increased in a pattern of mild LVH. Systolic function was normal. The estimated ejection fraction was in the range of 50% to 55%. There is akinesis of the mid-apicalanteroseptal and apical myocardium. There is akinesis of the apicalinferior myocardium. Doppler parameters are consistent with abnormal left ventricular relaxation (grade 1 diastolic dysfunction). - Mitral valve: There was mild regurgitation. - Right atrium: Central venous pressure (est): 3 mm Hg. - Tricuspid valve: There was trivial regurgitation. - Pulmonary arteries: PA peak pressure: 30 mm Hg (S). - Pericardium, extracardiac: There was no pericardial effusion.  Impressions:  - Mild LVH with LVEF 50-55%, wall motion abnormalties as outlined above consistent with ischemic heart disease. Grade 1 diastolic dysfunction. Mild mitral regurgitation. Trivial tricuspid regurgitation with PASP 30 mmHg.  Chest x-ray 10/01/2014: FINDINGS: There is no edema or consolidation. Heart size and pulmonary vascularity are normal. Patient is status post median sternotomy.  No adenopathy. No bone lesions.  IMPRESSION: No edema or consolidation.   Impression  1. Symptoms concerning for unstable angina, exertional over the last few months with rest symptoms today. Initial cardiac markers are normal. ECG is nonspecific but consistent with old anterior infarct. Follow-up echocardiogram done today reveals LVEF 50-55% with mid to apical anteroseptal and apical inferior akinesis. He is chest pain-free at present.  2. Multivessel CAD status post CABG at [place] back in 1981.  3. Essential hypertension.  4. Hyperlipidemia, on statin therapy as an outpatient.  5. History of TIA, on chronic Coumadin per Dr. Karie Kirks. INR is supratherapeutic at present.  Recommendations  Discussed with patient and son. Would recommend holding Coumadin but not reversing it in light of concurrent ACS. When INR drops below 2.0 would start heparin and also start aspirin 81 mg daily. We will begin low-dose beta blocker, otherwise continue statin and follow-up lipid panel, continue NTG. Plan is to pursue a diagnostic cardiac catheterization once his INR level has decreased below 2.0. He can likely be transferred to [facility] over the weekend, would contact our on-call team when appropriate.  Satira Sark, M.D., F.A.C.C.

## 2014-10-01 NOTE — ED Notes (Signed)
MD at bedside. 

## 2014-10-01 NOTE — ED Notes (Signed)
Benjamin Cordova at bedside  

## 2014-10-01 NOTE — ED Notes (Signed)
Report attempted. Nurse unavailable.

## 2014-10-01 NOTE — ED Notes (Signed)
CRITICAL VALUE ALERT  Critical value received:  INR 8.12  Date of notification:  10/01/14  Time of notification: 5638  Critical value read back:Yes.    Nurse who received alert:  Charmayne Sheer, RN  MD notified (1st page):  Hillard Danker.

## 2014-10-02 LAB — PROTIME-INR
INR: 3.87 — ABNORMAL HIGH (ref 0.00–1.49)
PROTHROMBIN TIME: 37.1 s — AB (ref 11.6–15.2)

## 2014-10-02 MED ORDER — PREDNISONE 10 MG PO TABS
10.0000 mg | ORAL_TABLET | Freq: Two times a day (BID) | ORAL | Status: DC
  Administered 2014-10-03 – 2014-10-14 (×22): 10 mg via ORAL
  Filled 2014-10-02 (×27): qty 1

## 2014-10-02 MED ORDER — POLYETHYLENE GLYCOL 3350 17 G PO PACK
17.0000 g | PACK | Freq: Every day | ORAL | Status: DC
  Administered 2014-10-02 – 2014-10-07 (×4): 17 g via ORAL
  Filled 2014-10-02 (×8): qty 1

## 2014-10-02 NOTE — Progress Notes (Signed)
[facility] PROGRESS NOTE  Benjamin Cordova ESP:[id number] DOB: 01/27/1935 DOA: 10/01/2014 PCP: Benjamin Bellow, MD  Assessment/Plan: 1. Chest pain with concerns for unstable angina. Patient has not had any further discomfort since coming to the hospital. Seen by cardiology and recommendations are that he will likely need cardiac catheterization once INR drops below 2. Will start the patient on aspirin and intravenous heparin once inr <2. On beta blockers and statin. 2. History of atrial fibrillation and TIA. Patient is on coumadin and presented with a supratherapeutic INR. Coumadin currently on hold. Will wait for INR to trend back down. 3. Hypertension. Stable 4. Multivessel coronary artery disease status post CABG. 5. Hyperlipidemia. Continue statin 6. Osteoarthritis. Patient is on a prednisone taper per his primary care physician   Code Status: full code Family Communication: discussed with patient Disposition Plan: discharge home once improved   Consultants:  Cardiology  Procedures:    Antibiotics:    HPI/Subjective: No chest pain or shortness of breath. No nausea or vomiting  Objective: Filed Vitals:   10/02/14 1255  BP: 126/62  Pulse: 77  Temp: 97.6 F (36.4 C)  Resp: 18    Intake/Output Summary (Last 24 hours) at 10/02/14 1709 Last data filed at 10/02/14 1200  Gross per 24 hour  Intake    720 ml  Output      3 ml  Net    717 ml   Filed Weights   10/01/14 0909 10/01/14 1435 10/02/14 0616  Weight: 102.967 kg (227 lb) 102.059 kg (225 lb) 101.606 kg (224 lb)    Exam:   General:  NAD  Cardiovascular: S1, S2 RRR  Respiratory: CTA B  Abdomen: soft,nt, nd, bs+  Musculoskeletal: no edema b/l   Data Reviewed: Basic Metabolic Panel:  Recent Labs Lab 10/01/14 0949  NA 137  K 4.5  CL 98*  CO2 32  GLUCOSE 134*  BUN 33*  CREATININE 0.94  CALCIUM 9.1   Liver Function Tests: No results for input(s): AST, ALT, ALKPHOS, BILITOT, PROT,  ALBUMIN in the last 168 hours. No results for input(s): LIPASE, AMYLASE in the last 168 hours. No results for input(s): AMMONIA in the last 168 hours. CBC:  Recent Labs Lab 10/01/14 0949  WBC 10.6*  HGB 12.6*  HCT 38.6*  MCV 92.8  PLT 123*   Cardiac Enzymes:  Recent Labs Lab 10/01/14 0949 10/01/14 1233 10/01/14 1531 10/01/14 2236  TROPONINI <0.03 <0.03 <0.03 <0.03   BNP (last 3 results) No results for input(s): BNP in the last 8760 hours.  ProBNP (last 3 results) No results for input(s): PROBNP in the last 8760 hours.  CBG: No results for input(s): GLUCAP in the last 168 hours.  No results found for this or any previous visit (from the past 240 hour(s)).   Studies: Dg Chest Port 1 View  10/01/2014   CLINICAL DATA:  Mid sternal region chest pain for 1 day  EXAM: PORTABLE CHEST - 1 VIEW  COMPARISON:  None.  FINDINGS: There is no edema or consolidation. Heart size and pulmonary vascularity are normal. Patient is status post median sternotomy. No adenopathy. No bone lesions.  IMPRESSION: No edema or consolidation.   Electronically Signed   By: Benjamin Cordova M.D.   On: 10/01/2014 09:29    Scheduled Meds: . atorvastatin  40 mg Oral q1800  . docusate sodium  100 mg Oral BID  . metoprolol tartrate  12.5 mg Oral BID  . polyethylene glycol  17 g Oral Daily  . [  START ON 10/03/2014] predniSONE  10 mg Oral BID WC  . sodium chloride  3 mL Intravenous Q12H  . sodium chloride  3 mL Intravenous Q12H   Continuous Infusions: . sodium chloride 1,000 mL (10/01/14 0940)    Principal Problem:   Unstable angina Active Problems:   Chest pain   Elevated INR   Leukocytosis   Thrombocytopenia   Chest pain at rest   Coagulopathy   Paroxysmal atrial fibrillation   Essential hypertension    Time spent: 54mins    Benjamin Cordova  [facility] Pager [phone]. If 7PM-7AM, please contact night-coverage at [email or web address], password [facility] 10/02/2014, 5:09 PM  LOS: 1 day

## 2014-10-03 DIAGNOSIS — R079 Chest pain, unspecified: Secondary | ICD-10-CM

## 2014-10-03 DIAGNOSIS — D696 Thrombocytopenia, unspecified: Secondary | ICD-10-CM

## 2014-10-03 LAB — BASIC METABOLIC PANEL
Anion gap: 8 (ref 5–15)
BUN: 27 mg/dL — ABNORMAL HIGH (ref 6–20)
CALCIUM: 8.7 mg/dL — AB (ref 8.9–10.3)
CHLORIDE: 102 mmol/L (ref 101–111)
CO2: 28 mmol/L (ref 22–32)
CREATININE: 0.77 mg/dL (ref 0.61–1.24)
GFR calc Af Amer: >60 mL/min (ref >60)
GFR calc non Af Amer: >60 mL/min (ref >60)
GLUCOSE: 87 mg/dL (ref 65–99)
Potassium: 3.9 mmol/L (ref 3.5–5.1)
Sodium: 138 mmol/L (ref 135–145)

## 2014-10-03 LAB — CBC
HCT: 39.6 % (ref 39.0–52.0)
HEMOGLOBIN: 13 g/dL (ref 13.0–17.0)
MCH: 30.2 pg (ref 26.0–34.0)
MCHC: 32.8 g/dL (ref 30.0–36.0)
MCV: 92.1 fL (ref 78.0–100.0)
PLATELETS: 129 10*3/uL — AB (ref 150–400)
RBC: 4.3 MIL/uL (ref 4.22–5.81)
RDW: 13.6 % (ref 11.5–15.5)
WBC: 7.8 10*3/uL (ref 4.0–10.5)

## 2014-10-03 LAB — PROTIME-INR
INR: 1.45 (ref 0.00–1.49)
PROTHROMBIN TIME: 17.7 s — AB (ref 11.6–15.2)

## 2014-10-03 LAB — HEPARIN LEVEL (UNFRACTIONATED): Heparin Unfractionated: 0.35 IU/mL (ref 0.30–0.70)

## 2014-10-03 MED ORDER — HEPARIN (PORCINE) IN NACL 100-0.45 UNIT/ML-% IJ SOLN
1150.0000 [IU]/h | INTRAMUSCULAR | Status: DC
  Administered 2014-10-03 – 2014-10-04 (×3): 1150 [IU]/h via INTRAVENOUS
  Filled 2014-10-03 (×3): qty 250

## 2014-10-03 MED ORDER — ASPIRIN EC 81 MG PO TBEC
81.0000 mg | DELAYED_RELEASE_TABLET | Freq: Every day | ORAL | Status: DC
  Administered 2014-10-03 – 2014-10-08 (×6): 81 mg via ORAL
  Filled 2014-10-03 (×7): qty 1

## 2014-10-03 MED ORDER — HEPARIN BOLUS VIA INFUSION
4000.0000 [IU] | Freq: Once | INTRAVENOUS | Status: AC
  Administered 2014-10-03: 4000 [IU] via INTRAVENOUS
  Filled 2014-10-03: qty 4000

## 2014-10-03 NOTE — Progress Notes (Signed)
Briefly seen after arrival to [facility] - transfer from [facility]. No chest pain, but has been having some exertional symptoms recently. Felt that he was constipated and this has resolved. Still recommending catheterization on Tuesday per Dr. Myles Gip note. He had CABG in 1981 at [facility], so his bypass grafts are quite old. Continue heparin. INR is now 1.45 which is acceptable.   Pixie Casino, MD, [facility] Attending Cardiologist [place]

## 2014-10-03 NOTE — Progress Notes (Signed)
Report given to RN at [facility].  IV restarted. Report given to [facility]. Patient's son present at bedside.

## 2014-10-03 NOTE — Progress Notes (Signed)
ANTICOAGULATION CONSULT NOTE - Initial Consult  Pharmacy Consult for Heparin Indication: chest pain/ACS  No Known Allergies  Patient Measurements: Height: 5\' 11"  (180.3 cm) Weight: 221 lb 12.5 oz (100.6 kg) IBW/kg (Calculated) : 75.3 HEPARIN DW (KG): 96.1  Vital Signs: Temp: 98 F (36.7 C) (07/03 0703) Temp Source: Oral (07/03 0703) BP: 155/79 mmHg (07/03 0703) Pulse Rate: 82 (07/03 0703)  Labs:  Recent Labs  10/01/14 0949 10/01/14 1233 10/01/14 1531 10/01/14 2236 10/02/14 0634 10/03/14 0626  HGB 12.6*  --   --   --   --  13.0  HCT 38.6*  --   --   --   --  39.6  PLT 123*  --   --   --   --  129*  APTT 50*  --   --   --   --   --   LABPROT 64.7*  --   --   --  37.1* 17.7*  INR 8.12*  --   --   --  3.87* 1.45  CREATININE 0.94  --   --   --   --  0.77  TROPONINI <0.03 <0.03 <0.03 <0.03  --   --     Estimated Creatinine Clearance: 93.4 mL/min (by C-G formula based on Cr of 0.77).  Medical History: Past Medical History  Diagnosis Date  . Coronary artery disease     Multivessel s/p CABG 1981 at [facility]  . Essential hypertension   . Myocardial infarction 1980  . Arthritis   . Hematuria   . Bladder infection, acute   . Peripheral vascular disease   . Polio osteopathy of lower leg Age 80    Affected right leg  . TIA (transient ischemic attack)     Chronic coumadin   Assessment: 79yo obese male presenting with chest pain on 10/01/14..  INR now 1.45, down from 8.1 on admission after Vitamin K given.  No further chest pain since admitted to hospital.  Per MD, will likely need cardiac cath.  Asked to initiate Heparin.  Pt w/ h/o CAD AND MI.  CBC OK.  Goal of Therapy:  Heparin level 0.3-0.7 units/ml Monitor platelets by anticoagulation protocol: Yes   Plan:   Heparin 4000 units IV now x 1  Heparin infusion at 1150 units/hr   Heparin level in 6-8 hours then daily  CBC daily while on heparin  [place] Crane, Jolan Mealor A 10/03/2014,7:59 AM

## 2014-10-03 NOTE — Progress Notes (Signed)
[facility] PROGRESS NOTE  Benjamin Cordova OQH:[id number] DOB: 03/27/1935 DOA: 10/01/2014 PCP: Benjamin Bellow, MD  Assessment/Plan: 1. Chest pain with concerns for unstable angina. Patient has not had any further discomfort since coming to the hospital. Seen by cardiology and recommendations were for cardiac catheterization once INR has corrected. INR is less than 2 today, so will start the patient on intravenous heparin and aspirin. On beta blockers and statin. I have discussed the case with Dr. Debara Cordova, on call for cardiology at [facility] who has accepted the patient in transfer. 2. Remote history of atrial fibrillation and TIA. Patient is on coumadin and presented with a supratherapeutic INR. Coumadin currently on hold. INR has normalized. 3. Hypertension. Stable 4. Multivessel coronary artery disease status post CABG. 5. Hyperlipidemia. Continue statin 6. Osteoarthritis. Patient is on a prednisone taper per his primary care physician   Code Status: full code Family Communication: discussed with patient Disposition Plan: Transfer to [facility] for further ischemic evaluation with cardiac catheterization.    Consultants:  Cardiology  Procedures:    Antibiotics:    HPI/Subjective: No chest pain or shortness of breath. No nausea or vomiting  Objective: Filed Vitals:   10/03/14 0703  BP: 155/79  Pulse: 82  Temp: 98 F (36.7 C)  Resp: 18    Intake/Output Summary (Last 24 hours) at 10/03/14 1327 Last data filed at 10/03/14 0950  Gross per 24 hour  Intake    480 ml  Output   1154 ml  Net   -674 ml   Filed Weights   10/01/14 1435 10/02/14 0616 10/03/14 0703  Weight: 102.059 kg (225 lb) 101.606 kg (224 lb) 100.6 kg (221 lb 12.5 oz)    Exam:   General:  NAD  Cardiovascular: S1, S2 regular  Respiratory: clear bilaterally  Abdomen: soft,nt, nd, bs+  Musculoskeletal: no edema b/l   Data Reviewed: Basic Metabolic Panel:  Recent Labs Lab  10/01/14 0949 10/03/14 0626  NA 137 138  K 4.5 3.9  CL 98* 102  CO2 32 28  GLUCOSE 134* 87  BUN 33* 27*  CREATININE 0.94 0.77  CALCIUM 9.1 8.7*   Liver Function Tests: No results for input(s): AST, ALT, ALKPHOS, BILITOT, PROT, ALBUMIN in the last 168 hours. No results for input(s): LIPASE, AMYLASE in the last 168 hours. No results for input(s): AMMONIA in the last 168 hours. CBC:  Recent Labs Lab 10/01/14 0949 10/03/14 0626  WBC 10.6* 7.8  HGB 12.6* 13.0  HCT 38.6* 39.6  MCV 92.8 92.1  PLT 123* 129*   Cardiac Enzymes:  Recent Labs Lab 10/01/14 0949 10/01/14 1233 10/01/14 1531 10/01/14 2236  TROPONINI <0.03 <0.03 <0.03 <0.03   BNP (last 3 results) No results for input(s): BNP in the last 8760 hours.  ProBNP (last 3 results) No results for input(s): PROBNP in the last 8760 hours.  CBG: No results for input(s): GLUCAP in the last 168 hours.  No results found for this or any previous visit (from the past 240 hour(s)).   Studies: No results found.  Scheduled Meds: . aspirin EC  81 mg Oral Daily  . atorvastatin  40 mg Oral q1800  . docusate sodium  100 mg Oral BID  . metoprolol tartrate  12.5 mg Oral BID  . polyethylene glycol  17 g Oral Daily  . predniSONE  10 mg Oral BID WC  . sodium chloride  3 mL Intravenous Q12H  . sodium chloride  3 mL Intravenous Q12H   Continuous Infusions: .  sodium chloride 1,000 mL (10/01/14 0940)  . heparin 1,150 Units/hr (10/03/14 0857)    Principal Problem:   Unstable angina Active Problems:   Chest pain   Elevated INR   Leukocytosis   Thrombocytopenia   Chest pain at rest   Coagulopathy   Paroxysmal atrial fibrillation   Essential hypertension    Time spent: 31mins    [name]  [facility] Pager [phone]. If 7PM-7AM, please contact night-coverage at [email or web address], password [facility] 10/03/2014, 1:27 PM  LOS: 2 days

## 2014-10-04 DIAGNOSIS — I10 Essential (primary) hypertension: Secondary | ICD-10-CM

## 2014-10-04 LAB — CBC
HEMATOCRIT: 38.6 % — AB (ref 39.0–52.0)
Hemoglobin: 13.1 g/dL (ref 13.0–17.0)
MCH: 30.4 pg (ref 26.0–34.0)
MCHC: 33.9 g/dL (ref 30.0–36.0)
MCV: 89.6 fL (ref 78.0–100.0)
PLATELETS: 131 10*3/uL — AB (ref 150–400)
RBC: 4.31 MIL/uL (ref 4.22–5.81)
RDW: 13.3 % (ref 11.5–15.5)
WBC: 9 10*3/uL (ref 4.0–10.5)

## 2014-10-04 LAB — HEPARIN LEVEL (UNFRACTIONATED)
HEPARIN UNFRACTIONATED: 0.5 [IU]/mL (ref 0.30–0.70)
HEPARIN UNFRACTIONATED: 0.45 [IU]/mL (ref 0.30–0.70)

## 2014-10-04 LAB — PROTIME-INR
INR: 1.13 (ref 0.00–1.49)
PROTHROMBIN TIME: 14.7 s (ref 11.6–15.2)

## 2014-10-04 MED ORDER — SODIUM CHLORIDE 0.9 % IJ SOLN: 3.0000 mL | INTRAMUSCULAR | Status: DC | PRN

## 2014-10-04 MED ORDER — SODIUM CHLORIDE 0.9 % IV SOLN: 250.0000 mL | INTRAVENOUS | Status: DC | PRN

## 2014-10-04 MED ORDER — SODIUM CHLORIDE 0.9 % IJ SOLN
3.0000 mL | Freq: Two times a day (BID) | INTRAMUSCULAR | Status: DC
  Administered 2014-10-04: 3 mL via INTRAVENOUS

## 2014-10-04 MED ORDER — SODIUM CHLORIDE 0.9 % IV SOLN
INTRAVENOUS | Status: DC
  Administered 2014-10-05 (×2): via INTRAVENOUS

## 2014-10-04 NOTE — Progress Notes (Signed)
Primary cardiologist: Dr. Satira Sark  Seen for followup: Unstable angina  Subjective:    No recurrent chest pain or shortness of breath. No palpitations.  Objective:   Temp:  [98.1 F (36.7 C)-98.2 F (36.8 C)] 98.2 F (36.8 C) (07/04 0432) Pulse Rate:  [98-106] 98 (07/04 0432) Resp:  [18-19] 19 (07/04 0432) BP: (104-139)/(69-90) 139/78 mmHg (07/04 0432) SpO2:  [95 %-98 %] 98 % (07/04 0432) Weight:  [222 lb 9.6 oz (100.971 kg)] 222 lb 9.6 oz (100.971 kg) (07/04 0432) Last BM Date: 10/03/14  Filed Weights   10/02/14 0616 10/03/14 0703 10/04/14 0432  Weight: 224 lb (101.606 kg) 221 lb 12.5 oz (100.6 kg) 222 lb 9.6 oz (100.971 kg)    Intake/Output Summary (Last 24 hours) at 10/04/14 0742 Last data filed at 10/04/14 0600  Gross per 24 hour  Intake 1082.09 ml  Output   1031 ml  Net  51.09 ml    Telemetry: Sinus rhythm.  Exam:  General: Appears comfortable at rest.  Lungs: Clear, nonlabored.  Cardiac: RRR without gallop.  Abdomen: NABS.  Extremities: No pitting edema.  Lab Results:  Basic Metabolic Panel:  Recent Labs Lab 10/01/14 0949 10/03/14 0626  NA 137 138  K 4.5 3.9  CL 98* 102  CO2 32 28  GLUCOSE 134* 87  BUN 33* 27*  CREATININE 0.94 0.77  CALCIUM 9.1 8.7*    CBC:  Recent Labs Lab 10/01/14 0949 10/03/14 0626 10/04/14 0407  WBC 10.6* 7.8 9.0  HGB 12.6* 13.0 13.1  HCT 38.6* 39.6 38.6*  MCV 92.8 92.1 89.6  PLT 123* 129* 131*    Cardiac Enzymes:  Recent Labs Lab 10/01/14 1233 10/01/14 1531 10/01/14 2236  TROPONINI <0.03 <0.03 <0.03    Coagulation:  Recent Labs Lab 10/02/14 0634 10/03/14 0626 10/04/14 0407  INR 3.87* 1.45 1.13    Echocardiogram 10/01/2014: Study Conclusions  - Left ventricle: The cavity size was normal. Wall thickness was increased in a pattern of mild LVH. Systolic function was normal. The estimated ejection fraction was in the range of 50% to 55%. There is akinesis of the  mid-apicalanteroseptal and apical myocardium. There is akinesis of the apicalinferior myocardium. Doppler parameters are consistent with abnormal left ventricular relaxation (grade 1 diastolic dysfunction). - Mitral valve: There was mild regurgitation. - Right atrium: Central venous pressure (est): 3 mm Hg. - Tricuspid valve: There was trivial regurgitation. - Pulmonary arteries: PA peak pressure: 30 mm Hg (S). - Pericardium, extracardiac: There was no pericardial effusion.  Impressions:  - Mild LVH with LVEF 50-55%, wall motion abnormalties as outlined above consistent with ischemic heart disease. Grade 1 diastolic dysfunction. Mild mitral regurgitation. Trivial tricuspid regurgitation with PASP 30 mmHg.    Medications:   Scheduled Medications: . aspirin EC  81 mg Oral Daily  . atorvastatin  40 mg Oral q1800  . docusate sodium  100 mg Oral BID  . metoprolol tartrate  12.5 mg Oral BID  . polyethylene glycol  17 g Oral Daily  . predniSONE  10 mg Oral BID WC  . sodium chloride  3 mL Intravenous Q12H  . sodium chloride  3 mL Intravenous Q12H     Infusions: . sodium chloride 1,000 mL (10/01/14 0940)  . heparin 1,150 Units/hr (10/04/14 0155)     PRN Medications:  sodium chloride, acetaminophen **OR** acetaminophen, alum & mag hydroxide-simeth, bisacodyl, HYDROcodone-acetaminophen, morphine injection, ondansetron **OR** ondansetron (ZOFRAN) IV, sodium chloride, traZODone   Assessment:   1. Unstable angina, cardiac  markers are normal. ECG is nonspecific but consistent with old anterior infarct. Follow-up echocardiogram reveals LVEF 50-55% with mid to apical anteroseptal and apical inferior akinesis.  2. Multivessel CAD status post CABG at [place] back in 1981.  3. Essential hypertension.  4. Hyperlipidemia, on statin therapy as an outpatient, LDL 54.  5. History of TIA, on chronic Coumadin per Dr. Karie Kirks. Now on Heparin GTT with INR down to 1.13. Patient states  Dr. Karie Kirks tried to stop Coumadin at one point and patient had visual changes, so has been on longterm.   Plan/Discussion:    For cardiac catheterization tomorrow.    Satira Sark, M.D., F.A.C.C.

## 2014-10-04 NOTE — Care Management (Signed)
Important Message  Patient Details  Name: Benjamin Cordova MRN: [id number] Date of Birth: [birth year]   Medicare Important Message Given:  Yes-second notification given    Loann Quill 10/04/2014, 9:52 AM

## 2014-10-04 NOTE — Progress Notes (Signed)
ANTICOAGULATION CONSULT NOTE - Initial Consult  Pharmacy Consult for Heparin Indication: chest pain/ACS  No Known Allergies  Patient Measurements: Height: 5\' 11"  (180.3 cm) Weight: 222 lb 9.6 oz (100.971 kg) IBW/kg (Calculated) : 75.3 HEPARIN DW (KG): 96.1  Vital Signs: Temp: 98.4 F (36.9 C) (07/04 1438) Temp Source: Oral (07/04 1438) BP: 109/66 mmHg (07/04 1438) Pulse Rate: 99 (07/04 1438)  Labs:  Recent Labs  10/01/14 2236 10/02/14 0634 10/03/14 0626 10/03/14 1406 10/04/14 0407 10/04/14 1523  HGB  --   --  13.0  --  13.1  --   HCT  --   --  39.6  --  38.6*  --   PLT  --   --  129*  --  131*  --   LABPROT  --  37.1* 17.7*  --  14.7  --   INR  --  3.87* 1.45  --  1.13  --   HEPARINUNFRC  --   --   --  0.35 0.50 0.45  CREATININE  --   --  0.77  --   --   --   TROPONINI <0.03  --   --   --   --   --     Estimated Creatinine Clearance: 93.6 mL/min (by C-G formula based on Cr of 0.77).  Medical History: Past Medical History  Diagnosis Date  . Coronary artery disease     Multivessel s/p CABG [facility] at [facility]  . Essential hypertension   . Myocardial infarction 1980  . Arthritis   . Hematuria   . Bladder infection, acute   . Peripheral vascular disease   . Polio osteopathy of lower leg Age 3    Affected right leg  . TIA (transient ischemic attack)     Chronic coumadin   Assessment: Benjamin Cordova presenting with chest pain on 10/01/14..  INR now 1.45, down from 8.1 on admission after Vitamin K given.  No further chest pain since admitted to hospital. He is planning for cath in AM. Heparin level has been therapeutic.  Goal of Therapy:  Heparin level 0.3-0.7 units/ml Monitor platelets by anticoagulation protocol: Yes   Plan:   Cont heparin 1150 units/hr Daily level and CBC  Onnie Boer, PharmD Pager: [phone] 10/04/2014 4:28 PM

## 2014-10-05 ENCOUNTER — Encounter (HOSPITAL_COMMUNITY)
Admission: EM | Disposition: A | Discharge status: Skilled Nursing Facility | Payer: Commercial Managed Care - HMO | Source: Home / Self Care | Attending: Thoracic Surgery (Cardiothoracic Vascular Surgery)

## 2014-10-05 ENCOUNTER — Other Ambulatory Visit: Payer: Self-pay | Admitting: *Deleted

## 2014-10-05 DIAGNOSIS — I2510 Atherosclerotic heart disease of native coronary artery without angina pectoris: Secondary | ICD-10-CM

## 2014-10-05 HISTORY — PX: CARDIAC CATHETERIZATION: SHX172

## 2014-10-05 LAB — BASIC METABOLIC PANEL
Anion gap: 9 (ref 5–15)
BUN: 26 mg/dL — AB (ref 6–20)
CO2: 24 mmol/L (ref 22–32)
CREATININE: 0.78 mg/dL (ref 0.61–1.24)
Calcium: 8.9 mg/dL (ref 8.9–10.3)
Chloride: 104 mmol/L (ref 101–111)
GFR calc non Af Amer: >60 mL/min (ref >60)
Glucose, Bld: 89 mg/dL (ref 65–99)
Potassium: 4.1 mmol/L (ref 3.5–5.1)
Sodium: 137 mmol/L (ref 135–145)

## 2014-10-05 LAB — CBC
HCT: 40.7 % (ref 39.0–52.0)
Hemoglobin: 14 g/dL (ref 13.0–17.0)
MCH: 31.3 pg (ref 26.0–34.0)
MCHC: 34.4 g/dL (ref 30.0–36.0)
MCV: 90.8 fL (ref 78.0–100.0)
Platelets: 128 10*3/uL — ABNORMAL LOW (ref 150–400)
RBC: 4.48 MIL/uL (ref 4.22–5.81)
RDW: 13.3 % (ref 11.5–15.5)
WBC: 10 10*3/uL (ref 4.0–10.5)

## 2014-10-05 LAB — POCT ACTIVATED CLOTTING TIME: Activated Clotting Time: 116 seconds

## 2014-10-05 LAB — HEPARIN LEVEL (UNFRACTIONATED): Heparin Unfractionated: 0.5 IU/mL (ref 0.30–0.70)

## 2014-10-05 LAB — PROTIME-INR
INR: 1.04 (ref 0.00–1.49)
PROTHROMBIN TIME: 13.8 s (ref 11.6–15.2)

## 2014-10-05 SURGERY — LEFT HEART CATH AND CORONARY ANGIOGRAPHY
Anesthesia: LOCAL

## 2014-10-05 MED ORDER — FENTANYL CITRATE (PF) 100 MCG/2ML IJ SOLN
INTRAMUSCULAR | Status: DC | PRN
  Administered 2014-10-05 (×2): 25 ug via INTRAVENOUS

## 2014-10-05 MED ORDER — HEPARIN (PORCINE) IN NACL 2-0.9 UNIT/ML-% IJ SOLN
INTRAMUSCULAR | Status: AC
  Filled 2014-10-05: qty 1000

## 2014-10-05 MED ORDER — LIDOCAINE HCL (PF) 1 % IJ SOLN
INTRAMUSCULAR | Status: AC
  Filled 2014-10-05: qty 30

## 2014-10-05 MED ORDER — FENTANYL CITRATE (PF) 100 MCG/2ML IJ SOLN
INTRAMUSCULAR | Status: AC
  Filled 2014-10-05: qty 2

## 2014-10-05 MED ORDER — SODIUM CHLORIDE 0.9 % IV SOLN: 250.0000 mL | INTRAVENOUS | Status: DC | PRN

## 2014-10-05 MED ORDER — SODIUM CHLORIDE 0.9 % IJ SOLN: 3.0000 mL | INTRAMUSCULAR | Status: DC | PRN

## 2014-10-05 MED ORDER — MIDAZOLAM HCL 2 MG/2ML IJ SOLN
INTRAMUSCULAR | Status: AC
  Filled 2014-10-05: qty 2

## 2014-10-05 MED ORDER — ATORVASTATIN CALCIUM 80 MG PO TABS
80.0000 mg | ORAL_TABLET | Freq: Every day | ORAL | Status: DC
  Administered 2014-10-05 – 2014-10-13 (×8): 80 mg via ORAL
  Filled 2014-10-05 (×10): qty 1

## 2014-10-05 MED ORDER — SODIUM CHLORIDE 0.9 % WEIGHT BASED INFUSION
3.0000 mL/kg/h | INTRAVENOUS | Status: AC
  Administered 2014-10-05: 3 mL/kg/h via INTRAVENOUS

## 2014-10-05 MED ORDER — METOPROLOL TARTRATE 25 MG PO TABS
25.0000 mg | ORAL_TABLET | Freq: Two times a day (BID) | ORAL | Status: DC
  Administered 2014-10-05 – 2014-10-08 (×6): 25 mg via ORAL
  Filled 2014-10-05 (×7): qty 1

## 2014-10-05 MED ORDER — MIDAZOLAM HCL 2 MG/2ML IJ SOLN
INTRAMUSCULAR | Status: DC | PRN
  Administered 2014-10-05 (×2): 1 mg via INTRAVENOUS

## 2014-10-05 MED ORDER — SODIUM CHLORIDE 0.9 % IJ SOLN: 3.0000 mL | Freq: Two times a day (BID) | INTRAMUSCULAR | Status: DC

## 2014-10-05 MED ORDER — HEPARIN (PORCINE) IN NACL 100-0.45 UNIT/ML-% IJ SOLN
1150.0000 [IU]/h | INTRAMUSCULAR | Status: DC
  Administered 2014-10-05 – 2014-10-07 (×4): 1150 [IU]/h via INTRAVENOUS
  Filled 2014-10-05 (×5): qty 250

## 2014-10-05 SURGICAL SUPPLY — 13 items
CATH INFINITI 5FR ANG PIGTAIL (CATHETERS) ×1 IMPLANT
CATH INFINITI 5FR MULTPACK ANG (CATHETERS) ×1 IMPLANT
CATH OPTITORQUE TIG 4.0 5F (CATHETERS) ×1 IMPLANT
DEVICE RAD COMP TR BAND LRG (VASCULAR PRODUCTS) ×1 IMPLANT
GLIDESHEATH SLEND A-KIT 6F 22G (SHEATH) ×1 IMPLANT
KIT HEART LEFT (KITS) ×2 IMPLANT
PACK CARDIAC CATHETERIZATION (CUSTOM PROCEDURE TRAY) ×2 IMPLANT
SHEATH PINNACLE 5F 10CM (SHEATH) ×1 IMPLANT
SYR MEDRAD MARK V 150ML (SYRINGE) ×2 IMPLANT
TRANSDUCER W/STOPCOCK (MISCELLANEOUS) ×3 IMPLANT
TUBING CIL FLEX 10 FLL-RA (TUBING) ×1 IMPLANT
WIRE EMERALD 3MM-J .035X150CM (WIRE) ×1 IMPLANT
WIRE SAFE-T 1.5MM-J .035X260CM (WIRE) ×1 IMPLANT

## 2014-10-05 NOTE — Progress Notes (Signed)
Patient Name: Benjamin Cordova Date of Encounter: 10/05/2014  Consulting cardiologist: Dr. Satira Sark   Principal Problem:   Unstable angina Active Problems:   Chest pain   Elevated INR   Leukocytosis   Thrombocytopenia   Chest pain at rest   Coagulopathy   Paroxysmal atrial fibrillation   Essential hypertension    SUBJECTIVE  Denies any CP or SOB. No CP since last Friday morning.  CURRENT MEDS . aspirin EC  81 mg Oral Daily  . atorvastatin  40 mg Oral q1800  . docusate sodium  100 mg Oral BID  . metoprolol tartrate  12.5 mg Oral BID  . polyethylene glycol  17 g Oral Daily  . predniSONE  10 mg Oral BID WC  . sodium chloride  3 mL Intravenous Q12H  . sodium chloride  3 mL Intravenous Q12H  . sodium chloride  3 mL Intravenous Q12H    OBJECTIVE  Filed Vitals:   10/04/14 1110 10/04/14 1438 10/04/14 2015 10/05/14 0414  BP: 124/79 109/66 166/94 166/93  Pulse: 103 99 116   Temp:  98.4 F (36.9 C) 98.2 F (36.8 C) 98.4 F (36.9 C)  TempSrc:  Oral Oral Oral  Resp:   20 20  Height:      Weight:    219 lb 6.4 oz (99.519 kg)  SpO2:  97% 97% 98%    Intake/Output Summary (Last 24 hours) at 10/05/14 0844 Last data filed at 10/05/14 0612  Gross per 24 hour  Intake  857.8 ml  Output    825 ml  Net   32.8 ml   Filed Weights   10/03/14 0703 10/04/14 0432 10/05/14 0414  Weight: 221 lb 12.5 oz (100.6 kg) 222 lb 9.6 oz (100.971 kg) 219 lb 6.4 oz (99.519 kg)    PHYSICAL EXAM  General: Pleasant, NAD. Neuro: Alert and oriented X 3. Moves all extremities spontaneously. Psych: Normal affect. HEENT:  Normal  Neck: Supple without bruits or JVD. Lungs:  Resp regular and unlabored, CTA. Heart: RRR no s3, s4, or murmurs. Abdomen: Soft, non-tender, non-distended, BS + x 4.  Extremities: No clubbing, cyanosis or edema. DP/PT/Radials 2+ and equal bilaterally.  Accessory Clinical Findings  CBC  Recent Labs  10/04/14 0407 10/05/14 0418  WBC 9.0 10.0  HGB 13.1  14.0  HCT 38.6* 40.7  MCV 89.6 90.8  PLT 131* 761*   Basic Metabolic Panel  Recent Labs  10/03/14 0626 10/05/14 0418  NA 138 137  K 3.9 4.1  CL 102 104  CO2 28 24  GLUCOSE 87 89  BUN 27* 26*  CREATININE 0.77 0.78  CALCIUM 8.7* 8.9    TELE Sinus tach with HR 95-105    ECG  No new EKG  Echocardiogram 10/01/2014  LV EF: 50% -  55%  ------------------------------------------------------------------- Indications:   Chest pain 786.51.  ------------------------------------------------------------------- Study Conclusions  - Left ventricle: The cavity size was normal. Wall thickness was increased in a pattern of mild LVH. Systolic function was normal. The estimated ejection fraction was in the range of 50% to 55%. There is akinesis of the mid-apicalanteroseptal and apical myocardium. There is akinesis of the apicalinferior myocardium. Doppler parameters are consistent with abnormal left ventricular relaxation (grade 1 diastolic dysfunction). - Mitral valve: There was mild regurgitation. - Right atrium: Central venous pressure (est): 3 mm Hg. - Tricuspid valve: There was trivial regurgitation. - Pulmonary arteries: PA peak pressure: 30 mm Hg (S). - Pericardium, extracardiac: There was no pericardial effusion.  Impressions:  - Mild LVH with LVEF 50-55%, wall motion abnormalties as outlined above consistent with ischemic heart disease. Grade 1 diastolic dysfunction. Mild mitral regurgitation. Trivial tricuspid regurgitation with PASP 30 mmHg.    Radiology/Studies  Dg Chest Port 1 View  10/01/2014   CLINICAL DATA:  Mid sternal region chest pain for 1 day  EXAM: PORTABLE CHEST - 1 VIEW  COMPARISON:  None.  FINDINGS: There is no edema or consolidation. Heart size and pulmonary vascularity are normal. Patient is status post median sternotomy. No adenopathy. No bone lesions.  IMPRESSION: No edema or consolidation.   Electronically Signed   By: Lowella Grip III M.D.   On: 10/01/2014 09:29    ASSESSMENT AND PLAN  1. Unstable angina  - echocardiogram reveals LVEF 50-55% with mid to apical anteroseptal and apical inferior akinesis  - pending cardiac catheterization today (scheduled for 1:30pm with Dr. Ellyn Hack)  - interventional MD to decide on the type of stent given the need for coumadin  2. Multivessel CAD s/p CABG at [place] back in 1981  3. HTN 4. HLD 5. H/o TIA on chronic coumadin  - Patient states Dr. Karie Kirks tried to stop Coumadin at one point and patient had visual changes, so has been on longterm.  - coumadin on hold with IV heparin, will restart post cath  Signed, Almyra Deforest PA-C Pager: [phone]  As above, patient seen and examined. He denies chest pain or dyspnea. He has ruled out for myocardial infarction. Plan cardiac catheterization today per Dr. Domenic Polite. Will resume Coumadin following procedure as outlined above. I do not think his INR needs to be therapeutic prior to discharge. Dose will need to be decreased as his INR was supratherapeutic prior to admission. This is followed by primary care. I will increase his metoprolol to 25 mg by mouth twice a day for better blood pressure control. Watch blood pressure closely. Resume lisinopril/HCT if needed as he was on this prior to admission. Increase  Lipitor to 80 mg daily. [name]

## 2014-10-05 NOTE — Progress Notes (Addendum)
ANTICOAGULATION CONSULT NOTE - Follow Up Consult  Pharmacy Consult for heparin Indication: chest pain/ACS; hx TIA/afib  No Known Allergies  Patient Measurements: Height: 5\' 11"  (180.3 cm) Weight: 219 lb 6.4 oz (99.519 kg) IBW/kg (Calculated) : 75.3 Heparin Dosing Weight: 96kg  Vital Signs: Temp: 98.4 F (36.9 C) (07/05 0414) Temp Source: Oral (07/05 0414) BP: 166/93 mmHg (07/05 0414)  Labs:  Recent Labs  10/03/14 0626  10/04/14 0407 10/04/14 1523 10/05/14 0418  HGB 13.0  --  13.1  --  14.0  HCT 39.6  --  38.6*  --  40.7  PLT 129*  --  131*  --  128*  LABPROT 17.7*  --  14.7  --  13.8  INR 1.45  --  1.13  --  1.04  HEPARINUNFRC  --   < > 0.50 0.45 0.50  CREATININE 0.77  --   --   --  0.78  < > = values in this interval not displayed.  Estimated Creatinine Clearance: 93 mL/min (by C-G formula based on Cr of 0.78).   Medications:  Scheduled:  . aspirin EC  81 mg Oral Daily  . atorvastatin  80 mg Oral q1800  . docusate sodium  100 mg Oral BID  . metoprolol tartrate  25 mg Oral BID  . polyethylene glycol  17 g Oral Daily  . predniSONE  10 mg Oral BID WC  . sodium chloride  3 mL Intravenous Q12H  . sodium chloride  3 mL Intravenous Q12H  . sodium chloride  3 mL Intravenous Q12H   Infusions:  . sodium chloride 1,000 mL (10/01/14 0940)  . sodium chloride 75 mL/hr at 10/05/14 0612  . heparin 1,150 Units/hr (10/04/14 2328)    Assessment: 79yo obese presenting with chest pain on 10/01/14 and on heparin. He is on coumadin PTA for afib/TIA and INR was noted > 8.0 on admit. Heparin level today is at goal (HL= 0.5) with HG= 14 and plt= 128. Plans for cath today and plans to resume coumadin post procedure. INR today= 1.04.  Goal of Therapy:  Heparin level 0.3-0.7 units/ml Monitor platelets by anticoagulation protocol: Yes   Plan:  -No heparin changes needed -Will follow plans post cath  Hildred Laser, Pharm D 10/05/2014 10:29 AM   ADDN: Pharmacy is consulted to dose  heparin 8h post-sheath removal. Sheath was removed at 1545.   Plan: Heparin 1150 units/hr 8h HL Daily HL/CBC  Andrey Cota. Diona Foley, PharmD Clinical Pharmacist Pager [phone]

## 2014-10-05 NOTE — Care Management Note (Addendum)
Case Management Note  Patient Details  Name: Benjamin Cordova MRN: [id number] Date of Birth: 1934/05/18  Subjective/Objective: Pt is a transfer from [facility]- admitted for chest pain.                    Action/Plan: No needs identified by CM at this time. Will continue to monitor.    Expected Discharge Date:                  Expected Discharge Plan:  Home/Self Care  In-House Referral:     Discharge planning Services  CM Consult  Post Acute Care Choice:    Choice offered to:     DME Arranged:    DME Agency:     HH Arranged:    HH Agency:     Status of Service:  In process, will continue to follow  Medicare Important Message Given:  Yes-second notification given Date Medicare IM Given:    Medicare IM give by:    Date Additional Medicare IM Given:    Additional Medicare Important Message give by:     If discussed at [place] of Stay Meetings, dates discussed:    Additional Comments:  1119 10-06-14 Jacqlyn Krauss, RN,BSN [phone] Post cath revealed severe native CAD with all vein grafts occluded include SVG to diagonal/LAD, SVG to OM1, and SVG to RCA. CVTS to consult-Consider redo CABG. CM will continue to monitor for disposition needs.   Bethena Roys, RN 10/05/2014, 12:27 PM

## 2014-10-05 NOTE — Progress Notes (Signed)
Site area: lt groin fa sheath Site Prior to Removal:  Level  0 Pressure Applied For:  25 minutes Manual:   yes Patient Status During Pull:  stable Post Pull Site:  Level  0 Post Pull Instructions Given:  yes Post Pull Pulses Present: yes Dressing Applied:  tegaderm Bedrest begins @  5056 Comments:  none

## 2014-10-05 NOTE — H&P (View-Only) (Signed)
Patient Name: Benjamin Cordova Date of Encounter: 10/05/2014  Consulting cardiologist: Dr. Satira Sark   Principal Problem:   Unstable angina Active Problems:   Chest pain   Elevated INR   Leukocytosis   Thrombocytopenia   Chest pain at rest   Coagulopathy   Paroxysmal atrial fibrillation   Essential hypertension    SUBJECTIVE  Denies any CP or SOB. No CP since last Friday morning.  CURRENT MEDS . aspirin EC  81 mg Oral Daily  . atorvastatin  40 mg Oral q1800  . docusate sodium  100 mg Oral BID  . metoprolol tartrate  12.5 mg Oral BID  . polyethylene glycol  17 g Oral Daily  . predniSONE  10 mg Oral BID WC  . sodium chloride  3 mL Intravenous Q12H  . sodium chloride  3 mL Intravenous Q12H  . sodium chloride  3 mL Intravenous Q12H    OBJECTIVE  Filed Vitals:   10/04/14 1110 10/04/14 1438 10/04/14 2015 10/05/14 0414  BP: 124/79 109/66 166/94 166/93  Pulse: 103 99 116   Temp:  98.4 F (36.9 C) 98.2 F (36.8 C) 98.4 F (36.9 C)  TempSrc:  Oral Oral Oral  Resp:   20 20  Height:      Weight:    219 lb 6.4 oz (99.519 kg)  SpO2:  97% 97% 98%    Intake/Output Summary (Last 24 hours) at 10/05/14 0844 Last data filed at 10/05/14 0612  Gross per 24 hour  Intake  857.8 ml  Output    825 ml  Net   32.8 ml   Filed Weights   10/03/14 0703 10/04/14 0432 10/05/14 0414  Weight: 221 lb 12.5 oz (100.6 kg) 222 lb 9.6 oz (100.971 kg) 219 lb 6.4 oz (99.519 kg)    PHYSICAL EXAM  General: Pleasant, NAD. Neuro: Alert and oriented X 3. Moves all extremities spontaneously. Psych: Normal affect. HEENT:  Normal  Neck: Supple without bruits or JVD. Lungs:  Resp regular and unlabored, CTA. Heart: RRR no s3, s4, or murmurs. Abdomen: Soft, non-tender, non-distended, BS + x 4.  Extremities: No clubbing, cyanosis or edema. DP/PT/Radials 2+ and equal bilaterally.  Accessory Clinical Findings  CBC  Recent Labs  10/04/14 0407 10/05/14 0418  WBC 9.0 10.0  HGB 13.1  14.0  HCT 38.6* 40.7  MCV 89.6 90.8  PLT 131* 527*   Basic Metabolic Panel  Recent Labs  10/03/14 0626 10/05/14 0418  NA 138 137  K 3.9 4.1  CL 102 104  CO2 28 24  GLUCOSE 87 89  BUN 27* 26*  CREATININE 0.77 0.78  CALCIUM 8.7* 8.9    TELE Sinus tach with HR 95-105    ECG  No new EKG  Echocardiogram 10/01/2014  LV EF: 50% -  55%  ------------------------------------------------------------------- Indications:   Chest pain 786.51.  ------------------------------------------------------------------- Study Conclusions  - Left ventricle: The cavity size was normal. Wall thickness was increased in a pattern of mild LVH. Systolic function was normal. The estimated ejection fraction was in the range of 50% to 55%. There is akinesis of the mid-apicalanteroseptal and apical myocardium. There is akinesis of the apicalinferior myocardium. Doppler parameters are consistent with abnormal left ventricular relaxation (grade 1 diastolic dysfunction). - Mitral valve: There was mild regurgitation. - Right atrium: Central venous pressure (est): 3 mm Hg. - Tricuspid valve: There was trivial regurgitation. - Pulmonary arteries: PA peak pressure: 30 mm Hg (S). - Pericardium, extracardiac: There was no pericardial effusion.  Impressions:  - Mild LVH with LVEF 50-55%, wall motion abnormalties as outlined above consistent with ischemic heart disease. Grade 1 diastolic dysfunction. Mild mitral regurgitation. Trivial tricuspid regurgitation with PASP 30 mmHg.    Radiology/Studies  Dg Chest Port 1 View  10/01/2014   CLINICAL DATA:  Mid sternal region chest pain for 1 day  EXAM: PORTABLE CHEST - 1 VIEW  COMPARISON:  None.  FINDINGS: There is no edema or consolidation. Heart size and pulmonary vascularity are normal. Patient is status post median sternotomy. No adenopathy. No bone lesions.  IMPRESSION: No edema or consolidation.   Electronically Signed   By: Lowella Grip III M.D.   On: 10/01/2014 09:29    ASSESSMENT AND PLAN  1. Unstable angina  - echocardiogram reveals LVEF 50-55% with mid to apical anteroseptal and apical inferior akinesis  - pending cardiac catheterization today (scheduled for 1:30pm with Dr. Ellyn Hack)  - interventional MD to decide on the type of stent given the need for coumadin  2. Multivessel CAD s/p CABG at [facility] back in 1981  3. HTN 4. HLD 5. H/o TIA on chronic coumadin  - Patient states Dr. Karie Kirks tried to stop Coumadin at one point and patient had visual changes, so has been on longterm.  - coumadin on hold with IV heparin, will restart post cath  Signed, Benjamin Deforest PA-C Pager: [phone]  As above, patient seen and examined. He denies chest pain or dyspnea. He has ruled out for myocardial infarction. Plan cardiac catheterization today per Dr. Domenic Polite. Will resume Coumadin following procedure as outlined above. I do not think his INR needs to be therapeutic prior to discharge. Dose will need to be decreased as his INR was supratherapeutic prior to admission. This is followed by primary care. I will increase his metoprolol to 25 mg by mouth twice a day for better blood pressure control. Watch blood pressure closely. Resume lisinopril/HCT if needed as he was on this prior to admission. Increase  Lipitor to 80 mg daily. Benjamin Cordova

## 2014-10-05 NOTE — Interval H&P Note (Signed)
History and Physical Interval Note:  10/05/2014 2:18 PM  Benjamin Cordova  has presented today for surgery, with the diagnosis of unstable angina  The various methods of treatment have been discussed with the patient and family. After consideration of risks, benefits and other options for treatment, the patient has consented to  Procedure(s): Left Heart Cath and Coronary Angiography (N/A) as a surgical intervention .  The patient's history has been reviewed, patient examined, no change in status, stable for surgery.  I have reviewed the patient's chart and labs.  Questions were answered to the patient's satisfaction.   Cath Lab Visit (complete for each Cath Lab visit)  Clinical Evaluation Leading to the Procedure:   ACS: Yes.    Non-ACS:    Anginal Classification: CCS III  Anti-ischemic medical therapy: No Therapy  Non-Invasive Test Results: No non-invasive testing performed  Prior CABG: Previous CABG        Collier Salina [facility] 10/05/2014 2:19 PM

## 2014-10-06 ENCOUNTER — Ambulatory Visit (HOSPITAL_COMMUNITY): Payer: Commercial Managed Care - HMO

## 2014-10-06 ENCOUNTER — Encounter (HOSPITAL_COMMUNITY): Payer: Self-pay | Admitting: Cardiology

## 2014-10-06 ENCOUNTER — Inpatient Hospital Stay (HOSPITAL_COMMUNITY): Payer: Commercial Managed Care - HMO

## 2014-10-06 LAB — PULMONARY FUNCTION TEST
DL/VA % PRED: 112 %
DL/VA: 5.02 ml/min/mmHg/L
DLCO UNC % PRED: 68 %
DLCO cor % pred: 74 %
DLCO cor: 21.99 ml/min/mmHg
DLCO unc: 20.42 ml/min/mmHg
FEF 25-75 Post: 2.91 L/sec
FEF 25-75 Pre: 1.61 L/sec
FEF2575-%Change-Post: 81 %
FEF2575-%Pred-Post: 152 %
FEF2575-%Pred-Pre: 84 %
FEV1-%Change-Post: 11 %
FEV1-%Pred-Post: 88 %
FEV1-%Pred-Pre: 79 %
FEV1-Post: 2.41 L
FEV1-Pre: 2.16 L
FEV1FVC-%Change-Post: 1 %
FEV1FVC-%Pred-Pre: 103 %
FEV6-%Change-Post: 9 %
FEV6-%PRED-POST: 88 %
FEV6-%Pred-Pre: 80 %
FEV6-POST: 3.14 L
FEV6-PRE: 2.86 L
FEV6FVC-%Change-Post: 0 %
FEV6FVC-%Pred-Post: 106 %
FEV6FVC-%Pred-Pre: 106 %
FVC-%Change-Post: 9 %
FVC-%PRED-PRE: 75 %
FVC-%Pred-Post: 83 %
FVC-POST: 3.17 L
FVC-Pre: 2.89 L
POST FEV6/FVC RATIO: 99 %
Post FEV1/FVC ratio: 76 %
Pre FEV1/FVC ratio: 75 %
Pre FEV6/FVC Ratio: 99 %
RV % pred: 108 %
RV: 2.71 L
TLC % PRED: 82 %
TLC: 5.49 L

## 2014-10-06 LAB — CBC
HEMATOCRIT: 37.1 % — AB (ref 39.0–52.0)
HEMOGLOBIN: 12.3 g/dL — AB (ref 13.0–17.0)
MCH: 29.9 pg (ref 26.0–34.0)
MCHC: 33.2 g/dL (ref 30.0–36.0)
MCV: 90.3 fL (ref 78.0–100.0)
PLATELETS: 124 10*3/uL — AB (ref 150–400)
RBC: 4.11 MIL/uL — AB (ref 4.22–5.81)
RDW: 13.3 % (ref 11.5–15.5)
WBC: 8 10*3/uL (ref 4.0–10.5)

## 2014-10-06 LAB — HEPARIN LEVEL (UNFRACTIONATED): Heparin Unfractionated: 0.47 IU/mL (ref 0.30–0.70)

## 2014-10-06 LAB — PROTIME-INR
INR: 1.01 (ref 0.00–1.49)
Prothrombin Time: 13.5 seconds (ref 11.6–15.2)

## 2014-10-06 MED ORDER — ALBUTEROL SULFATE (2.5 MG/3ML) 0.083% IN NEBU
2.5000 mg | INHALATION_SOLUTION | Freq: Once | RESPIRATORY_TRACT | Status: AC
  Administered 2014-10-06: 2.5 mg via RESPIRATORY_TRACT

## 2014-10-06 MED ORDER — ZOLPIDEM TARTRATE 5 MG PO TABS
5.0000 mg | ORAL_TABLET | Freq: Every evening | ORAL | Status: DC | PRN
  Administered 2014-10-06 – 2014-10-07 (×2): 5 mg via ORAL
  Filled 2014-10-06 (×2): qty 1

## 2014-10-06 NOTE — Progress Notes (Signed)
MD Harrington Challenger paged; pt states trazodone not effective for sleep last night, would like to try ambien. Will await new orders if appropriate for patient.

## 2014-10-06 NOTE — Progress Notes (Signed)
Patient Name: Benjamin Cordova Date of Encounter: 10/06/2014  Primary cardiologist: Previously Dr. Coralie Keens   Principal Problem:   Unstable angina Active Problems:   Chest pain   Elevated INR   Leukocytosis   Thrombocytopenia   Chest pain at rest   Coagulopathy   Paroxysmal atrial fibrillation   Essential hypertension    SUBJECTIVE  Denies any CP or SOB. No CP since last Friday morning.  CURRENT MEDS . aspirin EC  81 mg Oral Daily  . atorvastatin  80 mg Oral q1800  . docusate sodium  100 mg Oral BID  . metoprolol tartrate  25 mg Oral BID  . polyethylene glycol  17 g Oral Daily  . predniSONE  10 mg Oral BID WC  . sodium chloride  3 mL Intravenous Q12H    OBJECTIVE  Filed Vitals:   10/05/14 1705 10/05/14 1723 10/05/14 2100 10/06/14 0453  BP: 146/84 132/66 120/68 153/87  Pulse: 96 88 107 94  Temp:   98 F (36.7 C) 97.8 F (36.6 C)  TempSrc:   Oral Oral  Resp:   18 16  Height:      Weight:    220 lb (99.791 kg)  SpO2: 99% 100% 95% 98%    Intake/Output Summary (Last 24 hours) at 10/06/14 0740 Last data filed at 10/06/14 2505  Gross per 24 hour  Intake      0 ml  Output   2700 ml  Net  -2700 ml   Filed Weights   10/04/14 0432 10/05/14 0414 10/06/14 0453  Weight: 222 lb 9.6 oz (100.971 kg) 219 lb 6.4 oz (99.519 kg) 220 lb (99.791 kg)    PHYSICAL EXAM  General: Pleasant, NAD. Neuro: Alert and oriented X 3. Moves all extremities spontaneously. Psych: Normal affect. HEENT:  Normal  Neck: Supple without bruits or JVD. Lungs:  Resp regular and unlabored, CTA. Heart: RRR no s3, s4, or murmurs. Abdomen: Soft, non-tender, non-distended, BS + x 4.  Extremities: No clubbing, cyanosis or edema. DP/PT/Radials 2+ and equal bilaterally.  Accessory Clinical Findings  CBC  Recent Labs  10/05/14 0418 10/06/14 0450  WBC 10.0 8.0  HGB 14.0 12.3*  HCT 40.7 37.1*  MCV 90.8 90.3  PLT 128* 397*   Basic Metabolic Panel  Recent Labs  10/05/14 0418  NA  137  K 4.1  CL 104  CO2 24  GLUCOSE 89  BUN 26*  CREATININE 0.78  CALCIUM 8.9    TELE Sinus tach with HR 80s    ECG  No new EKG  Echocardiogram 10/01/2014  LV EF: 50% -  55%  ------------------------------------------------------------------- Indications:   Chest pain 786.51.  ------------------------------------------------------------------- Study Conclusions  - Left ventricle: The cavity size was normal. Wall thickness was increased in a pattern of mild LVH. Systolic function was normal. The estimated ejection fraction was in the range of 50% to 55%. There is akinesis of the mid-apicalanteroseptal and apical myocardium. There is akinesis of the apicalinferior myocardium. Doppler parameters are consistent with abnormal left ventricular relaxation (grade 1 diastolic dysfunction). - Mitral valve: There was mild regurgitation. - Right atrium: Central venous pressure (est): 3 mm Hg. - Tricuspid valve: There was trivial regurgitation. - Pulmonary arteries: PA peak pressure: 30 mm Hg (S). - Pericardium, extracardiac: There was no pericardial effusion.  Impressions:  - Mild LVH with LVEF 50-55%, wall motion abnormalties as outlined above consistent with ischemic heart disease. Grade 1 diastolic dysfunction. Mild mitral regurgitation. Trivial tricuspid regurgitation with PASP 30 mmHg.  Radiology/Studies  Dg Chest Port 1 View  10/01/2014   CLINICAL DATA:  Mid sternal region chest pain for 1 day  EXAM: PORTABLE CHEST - 1 VIEW  COMPARISON:  None.  FINDINGS: There is no edema or consolidation. Heart size and pulmonary vascularity are normal. Patient is status post median sternotomy. No adenopathy. No bone lesions.  IMPRESSION: No edema or consolidation.   Electronically Signed   By: Lowella Grip III M.D.   On: 10/01/2014 09:29    ASSESSMENT AND PLAN  1. Unstable angina  - echocardiogram reveals LVEF 50-55% with mid to apical anteroseptal and  apical inferior akinesis  - cath 10/05/2014 severe native CAD with all vein grafts occluded include SVG to diagonal/LAD, SVG to OM1, and SVG to RCA. Patent LIMA which was NOT used previously for bypass. Consider redo CABG  - will hold off on restarting coumadin. Pending eval by Dr. Roxan Hockey for redo CABG  2. Multivessel CAD s/p CABG at [facility] back in 1981  3. HTN 4. HLD 5. H/o TIA on chronic coumadin  - Patient states Dr. Karie Kirks tried to stop Coumadin at one point and patient had visual changes, so has been on longterm.  - coumadin on hold with IV heparin  Signed, Almyra Deforest PA-C Pager: [phone] As above, patient seen and examined. He denies chest pain or dyspnea. Cardiac catheterization results noted. Cardiothoracic surgery to assess for redo coronary artery bypass graft. Continue present medications. Resume Coumadin at discharge. [name]

## 2014-10-06 NOTE — Progress Notes (Signed)
UR Completed Kynzie Polgar Graves-Bigelow, RN,BSN [phone]  

## 2014-10-06 NOTE — Consult Note (Signed)
Reason for Consult:3 vessel native and graft CAD, evaluate for redo CABG Referring Physician: Dr. Martinique  Chisom GEROLD Benjamin Cordova is an 80 y.o. male.  HPI: Benjamin Cordova is a 80 y.o. Man with a history of CABG who presents with a chief complaint of chest pain.  Benjamin Cordova is a 80 yo man with a history of CAD, MI, s/p CABG x 4 in 1981, hypertension, polio, and TIA.   He had not had chest pain in many years until the past 6 months. He has been having some mild chest tightness with exertion that was relieved by rest. On 7/1 he was awakened by substernal chest pressure associated with diaphoresis and nausea. He took Advil and an antacid without any improvement.The pain continued an after an hour he called EMS.   He was brought to the ED. His ECG showed occasional PVCs and his initial troponin was negative. His pain resolved with nitroglycerin. He ruled out for MI with 3 negative troponins. An echo showed an EF of 50% with anteroseptal/ apical akinesis. Yesterday he had cardiac catheterization which revealed severe 3 vessel CAD with all grafts occluded. His EF was 35%.   Past Medical History  Diagnosis Date  . Coronary artery disease     Multivessel s/p CABG 1981 at [facility]  . Essential hypertension   . Myocardial infarction 1980  . Arthritis   . Hematuria   . Bladder infection, acute   . Peripheral vascular disease   . Polio osteopathy of lower leg Age 8    Affected right leg  . TIA (transient ischemic attack)     Chronic coumadin    Past Surgical History  Procedure Laterality Date  . Tonsillectomy  age 28    [facility]  . Foot surgery  1954    [facility], muscle implantation  . Coronary artery bypass graft  1981    [facility]  . Cystoscopy with injection  10/10/2010    Procedure: CYSTOSCOPY WITH INJECTION;  Surgeon: Marissa Nestle;  Location: [facility];  Service: Urology;  Laterality: N/A;  with retrograde urethrogram  . Cardiac catheterization N/A 10/05/2014    Procedure: Left Heart Cath and  Coronary Angiography;  Surgeon: Peter M Martinique, MD;  Location: [place] [facility];  Service: Cardiovascular;  Laterality: N/A;    Family History  Problem Relation Age of Onset  . Anesthesia problems Neg Hx   . Hypotension Neg Hx   . Malignant hyperthermia Neg Hx   . Pseudochol deficiency Neg Hx     Social History:  reports that he quit smoking about 35 years ago. His smoking use included Cigarettes. He has a 28 pack-year smoking history. His smokeless tobacco use includes Snuff. He reports that he does not drink alcohol or use illicit drugs.  Allergies: No Known Allergies  Medications:  Prior to Admission:  Prescriptions prior to admission  Medication Sig Dispense Refill Last Dose  . acidophilus (RISAQUAD) CAPS capsule Take 1 capsule by mouth daily.   09/30/2014 at Unknown time  . atenolol (TENORMIN) 100 MG tablet Take 50 mg by mouth daily.     09/30/2014 at 1100  . B Complex-C-Folic Acid (SUPER B-COMPLEX/VIT C/FA) TABS Take 1 tablet by mouth daily.     09/30/2014 at Unknown time  . folic acid (FOLVITE) 256 MCG tablet Take 800 mcg by mouth daily.    09/30/2014 at Unknown time  . hydrochlorothiazide 25 MG tablet Take 12.5 mg by mouth daily.     09/30/2014 at Unknown time  .  lisinopril (PRINIVIL,ZESTRIL) 20 MG tablet Take 10 mg by mouth every morning.   09/30/2014 at Unknown time  . Multiple Vitamin (MULTIVITAMIN PO) Take 1 tablet by mouth daily.     09/30/2014 at Unknown time  . naproxen sodium (ALEVE) 220 MG tablet Take 440 mg by mouth daily as needed (for pain).    09/30/2014 at Unknown time  . predniSONE (DELTASONE) 10 MG tablet Take 30 mg by mouth every morning.   09/30/2014 at Unknown time  . simvastatin (ZOCOR) 80 MG tablet Take 40 mg by mouth at bedtime.   09/30/2014 at Unknown time  . vitamin C (ASCORBIC ACID) 500 MG tablet Take 500 mg by mouth daily.     09/30/2014 at Unknown time  . warfarin (COUMADIN) 10 MG tablet Take 10 mg by mouth daily.    09/30/2014 at 1100    Results for orders  placed or performed during the hospital encounter of 10/01/14 (from the past 48 hour(s))  CBC     Status: Abnormal   Collection Time: 10/05/14  4:18 AM  Result Value Ref Range   WBC 10.0 4.0 - 10.5 K/uL   RBC 4.48 4.22 - 5.81 MIL/uL   Hemoglobin 14.0 13.0 - 17.0 g/dL   HCT 40.7 39.0 - 52.0 %   MCV 90.8 78.0 - 100.0 fL   MCH 31.3 26.0 - 34.0 pg   MCHC 34.4 30.0 - 36.0 g/dL   RDW 13.3 11.5 - 15.5 %   Platelets 128 (L) 150 - 400 K/uL  Heparin level (unfractionated)     Status: None   Collection Time: 10/05/14  4:18 AM  Result Value Ref Range   Heparin Unfractionated 0.50 0.30 - 0.70 IU/mL    Comment:        IF HEPARIN RESULTS ARE BELOW EXPECTED VALUES, AND PATIENT DOSAGE HAS BEEN CONFIRMED, SUGGEST FOLLOW UP TESTING OF ANTITHROMBIN III LEVELS.   Basic metabolic panel     Status: Abnormal   Collection Time: 10/05/14  4:18 AM  Result Value Ref Range   Sodium 137 135 - 145 mmol/L   Potassium 4.1 3.5 - 5.1 mmol/L   Chloride 104 101 - 111 mmol/L   CO2 24 22 - 32 mmol/L   Glucose, Bld 89 65 - 99 mg/dL   BUN 26 (H) 6 - 20 mg/dL   Creatinine, Ser 0.78 0.61 - 1.24 mg/dL   Calcium 8.9 8.9 - 10.3 mg/dL   GFR calc non Af Amer >60 >60 mL/min   GFR calc Af Amer >60 >60 mL/min    Comment: (NOTE) The eGFR has been calculated using the CKD EPI equation. This calculation has not been validated in all clinical situations. eGFR's persistently <60 mL/min signify possible Chronic Kidney Disease.    Anion gap 9 5 - 15  Protime-INR     Status: None   Collection Time: 10/05/14  4:18 AM  Result Value Ref Range   Prothrombin Time 13.8 11.6 - 15.2 seconds   INR 1.04 0.00 - 1.49  POCT Activated clotting time     Status: None   Collection Time: 10/05/14  2:54 PM  Result Value Ref Range   Activated Clotting Time 116 seconds  Protime-INR     Status: None   Collection Time: 10/06/14  4:50 AM  Result Value Ref Range   Prothrombin Time 13.5 11.6 - 15.2 seconds   INR 1.01 0.00 - 1.49  CBC      Status: Abnormal   Collection Time: 10/06/14  4:50 AM  Result Value Ref Range   WBC 8.0 4.0 - 10.5 K/uL   RBC 4.11 (L) 4.22 - 5.81 MIL/uL   Hemoglobin 12.3 (L) 13.0 - 17.0 g/dL   HCT 37.1 (L) 39.0 - 52.0 %   MCV 90.3 78.0 - 100.0 fL   MCH 29.9 26.0 - 34.0 pg   MCHC 33.2 30.0 - 36.0 g/dL   RDW 13.3 11.5 - 15.5 %   Platelets 124 (L) 150 - 400 K/uL  Heparin level (unfractionated)     Status: None   Collection Time: 10/06/14  7:47 AM  Result Value Ref Range   Heparin Unfractionated 0.47 0.30 - 0.70 IU/mL    Comment:        IF HEPARIN RESULTS ARE BELOW EXPECTED VALUES, AND PATIENT DOSAGE HAS BEEN CONFIRMED, SUGGEST FOLLOW UP TESTING OF ANTITHROMBIN III LEVELS.     No results found.  Review of Systems  Constitutional: Positive for diaphoresis.  Cardiovascular: Positive for chest pain.  Gastrointestinal: Positive for nausea. Negative for abdominal pain and blood in stool.  Genitourinary: Positive for frequency. Negative for dysuria and urgency.  Musculoskeletal: Positive for joint pain.       Polio- right leg atrophic  Neurological: Positive for focal weakness (right leg).  All other systems reviewed and are negative.  Blood pressure 120/69, pulse 101, temperature 98.2 F (36.8 C), temperature source Oral, resp. rate 16, height '5\' 11"'  (1.803 m), weight 220 lb (99.791 kg), SpO2 96 %. Physical Exam  Vitals reviewed. Constitutional: He is oriented to person, place, and time. He appears well-developed and well-nourished. No distress.  HENT:  Head: Normocephalic and atraumatic.  Eyes: Conjunctivae and EOM are normal. Pupils are equal, round, and reactive to light.  Neck: Neck supple. No thyromegaly present.  Cardiovascular: Normal rate, regular rhythm and normal heart sounds.  Exam reveals no gallop and no friction rub.   No murmur heard. Normal Allen's test on left, DP/PT intact on left  Respiratory: Effort normal and breath sounds normal. He has no wheezes. He has no rales.   GI: Soft. He exhibits no distension. There is no tenderness.  Musculoskeletal: Normal range of motion. He exhibits no edema.  Well healed surgical scar left leg Atrophic right leg below knee  Lymphadenopathy:    He has no cervical adenopathy.  Neurological: He is alert and oriented to person, place, and time. No cranial nerve deficit.  Motor 5/5 except RLE  Skin: Skin is warm and dry.  Discolored right LE  Psychiatric: He has a normal mood and affect.   I personally reviewed the catheterization films and concur with the official reading   Prox LAD lesion, 80% stenosed.  Mid LAD lesion, 100% stenosed.  1st Diag-1 lesion, 90% stenosed.  1st Diag-2 lesion, 100% stenosed.  1st Mrg lesion, 100% stenosed.  2nd Mrg lesion, 90% stenosed.  Prox RCA to Mid RCA lesion, 90% stenosed.  Mid RCA to Dist RCA lesion, 100% stenosed.  Acute Mrg lesion, 90% stenosed.  SVG .  Origin lesion, before 1st Diag, 100% stenosed.  Origin lesion, 100% stenosed.  Origin lesion, 100% stenosed.  There is moderate to severe left ventricular systolic dysfunction.  1. Severe 3 vessel obstructive/occlusive CAD. 2. All grafts are occluded including SVG to diagonal/LAD, SVG to OM1, and SVG to RCA. 3. Moderate to severe LV dysfunction. 4. The LIMA is widely patent as was not used previously for bypass.  Recommendation: consider for redo CABG   Assessment/Plan: 80 yo man with a long history of  CAD, s/p CABG x 4 in 1981 using all vein grafts. He now presents with an unstable coronary syndrome. At catheterization he has severe native 3 vessel CAD and all his grafts are occluded. He needs revascularization for survival benefit and relief of symptoms.   His LAD is diffusely diseased but appears graftable as does a large OM. It is unclear if there will be any other graftable targets.  I discussed the general nature of the procedure, the need for general anesthesia, the incisions to be used, and the use  of cardiopulmonary bypass with Mr. Tigert. We discussed the expected hospital stay, overall recovery and short and long term outcomes. I reviewed the indications, risks, benefits, and alternatives with him. He understands the risks include, but are not limited to death, stroke, MI, DVT/PE, bleeding, possible need for transfusion, infections, cardiac arrhythmias, and other organ system dysfunction including respiratory, renal, or GI complications. He understands the risk is higher for redo grafting and the results are more unpredictable. We discussed possible left radial artery usage and the risks associated with that- infection, ischemia.  He understands and accepts the risks and agrees to proceed.  Plan OR on Friday 7/8  Melrose Nakayama 10/06/2014, 5:22 PM

## 2014-10-06 NOTE — Progress Notes (Signed)
ANTICOAGULATION CONSULT NOTE - Follow Up Consult  Pharmacy Consult for heparin Indication: chest pain/ACS; hx TIA/afib  No Known Allergies  Patient Measurements: Height: 5\' 11"  (180.3 cm) Weight: 220 lb (99.791 kg) IBW/kg (Calculated) : 75.3 Heparin Dosing Weight: 96kg  Vital Signs: Temp: 97.8 F (36.6 C) (07/06 0453) Temp Source: Oral (07/06 0453) BP: 153/87 mmHg (07/06 0453) Pulse Rate: 94 (07/06 0453)  Labs:  Recent Labs  10/04/14 0407 10/04/14 1523 10/05/14 0418 10/06/14 0450 10/06/14 0747  HGB 13.1  --  14.0 12.3*  --   HCT 38.6*  --  40.7 37.1*  --   PLT 131*  --  128* 124*  --   LABPROT 14.7  --  13.8 13.5  --   INR 1.13  --  1.04 1.01  --   HEPARINUNFRC 0.50 0.45 0.50  --  0.47  CREATININE  --   --  0.78  --   --     Estimated Creatinine Clearance: 93.1 mL/min (by C-G formula based on Cr of 0.78).   Medications:  Scheduled:  . aspirin EC  81 mg Oral Daily  . atorvastatin  80 mg Oral q1800  . docusate sodium  100 mg Oral BID  . metoprolol tartrate  25 mg Oral BID  . polyethylene glycol  17 g Oral Daily  . predniSONE  10 mg Oral BID WC  . sodium chloride  3 mL Intravenous Q12H   Infusions:  . sodium chloride 250 mL (10/05/14 2350)  . heparin 1,150 Units/hr (10/05/14 2351)    Assessment: 80yo obese presenting with chest pain on 10/01/14 and on heparin. He is on coumadin PTA for afib/TIA and INR was noted > 8.0 on admit. Heparin level today after restart post cath is at goal (HL= 0.47). Plans noted for possible redo CABG.   Goal of Therapy:  Heparin level 0.3-0.7 units/ml Monitor platelets by anticoagulation protocol: Yes   Plan:  -No heparin changes needed -Will follow plans post cath  Hildred Laser, Pharm D 10/06/2014 8:49 AM

## 2014-10-07 ENCOUNTER — Inpatient Hospital Stay (HOSPITAL_COMMUNITY): Payer: Commercial Managed Care - HMO

## 2014-10-07 ENCOUNTER — Encounter (HOSPITAL_COMMUNITY): Payer: Self-pay | Admitting: Certified Registered Nurse Anesthetist

## 2014-10-07 LAB — CBC
HCT: 37.7 % — ABNORMAL LOW (ref 39.0–52.0)
Hemoglobin: 12.5 g/dL — ABNORMAL LOW (ref 13.0–17.0)
MCH: 29.9 pg (ref 26.0–34.0)
MCHC: 33.2 g/dL (ref 30.0–36.0)
MCV: 90.2 fL (ref 78.0–100.0)
Platelets: 124 K/uL — ABNORMAL LOW (ref 150–400)
RBC: 4.18 MIL/uL — ABNORMAL LOW (ref 4.22–5.81)
RDW: 13.3 % (ref 11.5–15.5)
WBC: 8.9 K/uL (ref 4.0–10.5)

## 2014-10-07 LAB — PROTIME-INR
INR: 1.04 (ref 0.00–1.49)
INR: 1 (ref 0.00–1.49)
Prothrombin Time: 13.8 seconds (ref 11.6–15.2)
Prothrombin Time: 13.4 seconds (ref 11.6–15.2)

## 2014-10-07 LAB — URINE MICROSCOPIC-ADD ON

## 2014-10-07 LAB — HEPARIN LEVEL (UNFRACTIONATED): Heparin Unfractionated: 0.4 IU/mL (ref 0.30–0.70)

## 2014-10-07 LAB — URINALYSIS, ROUTINE W REFLEX MICROSCOPIC
BILIRUBIN URINE: NEGATIVE
GLUCOSE, UA: NEGATIVE mg/dL
KETONES UR: NEGATIVE mg/dL
NITRITE: POSITIVE — AB
Protein, ur: 30 mg/dL — AB
SPECIFIC GRAVITY, URINE: 1.011 (ref 1.005–1.030)
UROBILINOGEN UA: 1 mg/dL (ref 0.0–1.0)
pH: 6.5 (ref 5.0–8.0)

## 2014-10-07 LAB — APTT: aPTT: 68 seconds — ABNORMAL HIGH (ref 24–37)

## 2014-10-07 LAB — SURGICAL PCR SCREEN
MRSA, PCR: NEGATIVE
Staphylococcus aureus: NEGATIVE

## 2014-10-07 LAB — ABO/RH: ABO/RH(D): O POS

## 2014-10-07 MED ORDER — MAGNESIUM SULFATE 50 % IJ SOLN
40.0000 meq | INTRAMUSCULAR | Status: DC
  Filled 2014-10-07: qty 10

## 2014-10-07 MED ORDER — TEMAZEPAM 15 MG PO CAPS: 15.0000 mg | ORAL_CAPSULE | Freq: Once | ORAL | Status: AC | PRN

## 2014-10-07 MED ORDER — BISACODYL 5 MG PO TBEC
5.0000 mg | DELAYED_RELEASE_TABLET | Freq: Once | ORAL | Status: DC
  Filled 2014-10-07: qty 1

## 2014-10-07 MED ORDER — PLASMA-LYTE 148 IV SOLN
INTRAVENOUS | Status: AC
  Administered 2014-10-08: 12:00:00
  Filled 2014-10-07: qty 2.5

## 2014-10-07 MED ORDER — CHLORHEXIDINE GLUCONATE 4 % EX LIQD
60.0000 mL | Freq: Once | CUTANEOUS | Status: AC
  Administered 2014-10-07: 4 via TOPICAL
  Filled 2014-10-07: qty 120

## 2014-10-07 MED ORDER — CHLORHEXIDINE GLUCONATE 4 % EX LIQD
60.0000 mL | Freq: Once | CUTANEOUS | Status: DC
  Filled 2014-10-07: qty 15

## 2014-10-07 MED ORDER — DEXTROSE 5 % IV SOLN
1.5000 g | INTRAVENOUS | Status: AC
  Administered 2014-10-08: 1.5 g via INTRAVENOUS
  Administered 2014-10-08: .75 g via INTRAVENOUS
  Filled 2014-10-07: qty 1.5

## 2014-10-07 MED ORDER — EPINEPHRINE HCL 1 MG/ML IJ SOLN
0.0000 ug/min | INTRAMUSCULAR | Status: DC
  Filled 2014-10-07: qty 4

## 2014-10-07 MED ORDER — VANCOMYCIN HCL 10 G IV SOLR
1500.0000 mg | INTRAVENOUS | Status: AC
  Administered 2014-10-08: 1500 mg via INTRAVENOUS
  Filled 2014-10-07: qty 1500

## 2014-10-07 MED ORDER — SODIUM CHLORIDE 0.9 % IV SOLN
INTRAVENOUS | Status: DC
  Filled 2014-10-07: qty 30

## 2014-10-07 MED ORDER — CIPROFLOXACIN HCL 500 MG PO TABS
500.0000 mg | ORAL_TABLET | Freq: Every day | ORAL | Status: DC
  Administered 2014-10-07 – 2014-10-09 (×3): 500 mg via ORAL
  Filled 2014-10-07 (×5): qty 1

## 2014-10-07 MED ORDER — SODIUM CHLORIDE 0.9 % IV SOLN
INTRAVENOUS | Status: DC
  Filled 2014-10-07: qty 40

## 2014-10-07 MED ORDER — POTASSIUM CHLORIDE 2 MEQ/ML IV SOLN
80.0000 meq | INTRAVENOUS | Status: DC
Start: 2014-10-08 — End: 2014-10-08
  Filled 2014-10-07: qty 40

## 2014-10-07 MED ORDER — DIAZEPAM 2 MG PO TABS
2.0000 mg | ORAL_TABLET | Freq: Once | ORAL | Status: AC
  Administered 2014-10-08: 2 mg via ORAL
  Filled 2014-10-07: qty 1

## 2014-10-07 MED ORDER — INSULIN REGULAR HUMAN 100 UNIT/ML IJ SOLN
INTRAMUSCULAR | Status: DC
  Filled 2014-10-07: qty 2.5

## 2014-10-07 MED ORDER — DEXTROSE 5 % IV SOLN
750.0000 mg | INTRAVENOUS | Status: DC
  Filled 2014-10-07: qty 750

## 2014-10-07 MED ORDER — DOPAMINE-DEXTROSE 3.2-5 MG/ML-% IV SOLN
0.0000 ug/kg/min | INTRAVENOUS | Status: DC
  Filled 2014-10-07: qty 250

## 2014-10-07 MED ORDER — ALPRAZOLAM 0.25 MG PO TABS: 0.2500 mg | ORAL_TABLET | ORAL | Status: DC | PRN

## 2014-10-07 MED ORDER — DEXMEDETOMIDINE HCL IN NACL 400 MCG/100ML IV SOLN
0.1000 ug/kg/h | INTRAVENOUS | Status: DC
  Filled 2014-10-07: qty 100

## 2014-10-07 MED ORDER — PHENYLEPHRINE HCL 10 MG/ML IJ SOLN
30.0000 ug/min | INTRAVENOUS | Status: DC
  Filled 2014-10-07: qty 2

## 2014-10-07 MED ORDER — NITROGLYCERIN IN D5W 200-5 MCG/ML-% IV SOLN
2.0000 ug/min | INTRAVENOUS | Status: DC
  Filled 2014-10-07: qty 250

## 2014-10-07 MED ORDER — METOPROLOL TARTRATE 12.5 MG HALF TABLET
12.5000 mg | ORAL_TABLET | Freq: Once | ORAL | Status: AC
  Administered 2014-10-08: 12.5 mg via ORAL
  Filled 2014-10-07: qty 1

## 2014-10-07 MED FILL — Lidocaine HCl Local Preservative Free (PF) Inj 1%: INTRAMUSCULAR | Qty: 30 | Status: AC

## 2014-10-07 MED FILL — Heparin Sodium (Porcine) 2 Unit/ML in Sodium Chloride 0.9%: INTRAMUSCULAR | Qty: 1000 | Status: AC

## 2014-10-07 NOTE — Progress Notes (Signed)
2 Days Post-Op Procedure(s) (LRB): Left Heart Cath and Coronary Angiography (N/A) Subjective: No complaints. Had some transient CP last night but none today  Objective: Vital signs in last 24 hours: Temp:  [98.2 F (36.8 C)] 98.2 F (36.8 C) (07/07 0500) Pulse Rate:  [83-101] 93 (07/07 0500) Cardiac Rhythm:  [-] Normal sinus rhythm (07/07 0752) Resp:  [16-18] 18 (07/07 0500) BP: (120-135)/(69-79) 135/79 mmHg (07/07 0500) SpO2:  [96 %-100 %] 98 % (07/07 0500) Weight:  [219 lb 4.8 oz (99.474 kg)] 219 lb 4.8 oz (99.474 kg) (07/07 0500)  Hemodynamic parameters for last 24 hours:    Intake/Output from previous day: 07/06 0701 - 07/07 0700 In: 960 [P.O.:960] Out: 1525 [Urine:1525] Intake/Output this shift: Total I/O In: 600 [P.O.:600] Out: 100 [Urine:100]  General appearance: alert and no distress Neurologic: intact Heart: regular rate and rhythm Lungs: clear to auscultation bilaterally  Lab Results:  Recent Labs  10/06/14 0450 10/07/14 0235  WBC 8.0 8.9  HGB 12.3* 12.5*  HCT 37.1* 37.7*  PLT 124* 124*   BMET:  Recent Labs  10/05/14 0418  NA 137  K 4.1  CL 104  CO2 24  GLUCOSE 89  BUN 26*  CREATININE 0.78  CALCIUM 8.9    PT/INR:  Recent Labs  10/07/14 0235  LABPROT 13.8  INR 1.04   ABG No results found for: PHART, HCO3, TCO2, ACIDBASEDEF, O2SAT CBG (last 3)  No results for input(s): GLUCAP in the last 72 hours.  Assessment/Plan: S/P Procedure(s) (LRB): Left Heart Cath and Coronary Angiography (N/A) -  For Redo CABG tomorrow PFTs and carotid duplex OK Vascular lab reported drop in palmar arch signal with left radial compression. I rechecked Allen's test and it is normal. Will reassess in AM and make final decision re: left radial harvest in OR All questions answered   LOS: 6 days    Melrose Nakayama 10/07/2014

## 2014-10-07 NOTE — Progress Notes (Signed)
Pre-op Cardiac Surgery  Carotid Findings:  Findings suggest 1-39% internal carotid artery stenosis bilaterally. Vertebral arteries are patent with antegrade flow.  Upper Extremity Right Left  Brachial Pressures 152-Triphasic Restricted extremity  Radial Waveforms Triphasic Triphasic  Ulnar Waveforms Triphasic Triphasic  Palmar Arch (Allen's Test) Within normal limits Signal decreases greater than 50% with radial compression, is unaffected with ulnar compression.   10/07/2014 10:27 AM Maudry Mayhew, RVT, RDCS, RDMS

## 2014-10-07 NOTE — Progress Notes (Signed)
ANTICOAGULATION CONSULT NOTE - Follow Up Consult  Pharmacy Consult for heparin Indication: chest pain/ACS; hx TIA/afib  No Known Allergies  Patient Measurements: Height: 5\' 11"  (180.3 cm) Weight: 219 lb 4.8 oz (99.474 kg) IBW/kg (Calculated) : 75.3 Heparin Dosing Weight: 96kg  Vital Signs: Temp: 98.2 F (36.8 C) (07/07 0500) Temp Source: Oral (07/07 0500) BP: 135/79 mmHg (07/07 0500) Pulse Rate: 93 (07/07 0500)  Labs:  Recent Labs  10/05/14 0418 10/06/14 0450 10/06/14 0747 10/07/14 0235  HGB 14.0 12.3*  --  12.5*  HCT 40.7 37.1*  --  37.7*  PLT 128* 124*  --  124*  LABPROT 13.8 13.5  --  13.8  INR 1.04 1.01  --  1.04  HEPARINUNFRC 0.50  --  0.47 0.40  CREATININE 0.78  --   --   --     Estimated Creatinine Clearance: 93 mL/min (by C-G formula based on Cr of 0.78).   Medications:  Scheduled:  . aspirin EC  81 mg Oral Daily  . atorvastatin  80 mg Oral q1800  . docusate sodium  100 mg Oral BID  . metoprolol tartrate  25 mg Oral BID  . polyethylene glycol  17 g Oral Daily  . predniSONE  10 mg Oral BID WC  . sodium chloride  3 mL Intravenous Q12H   Infusions:  . sodium chloride 250 mL (10/05/14 2350)  . heparin 1,150 Units/hr (10/06/14 1839)    Assessment: 80yo obese presenting with chest pain on 10/01/14 and on heparin. He is on coumadin PTA for afib/TIA and INR was noted > 8.0 on admit. Heparin level today after restart post cath is at goal (HL= 0.48). Plans noted for  redo CABG on 7/8.   Goal of Therapy:  Heparin level 0.3-0.7 units/ml Monitor platelets by anticoagulation protocol: Yes   Plan:  -No heparin changes needed -Daily heparin level and CBC  Hildred Laser, Pharm D 10/07/2014 8:08 AM

## 2014-10-07 NOTE — Progress Notes (Signed)
UA results called to Jadene Pierini, PA with [facility]. PO cipro ordered and given.

## 2014-10-07 NOTE — Progress Notes (Signed)
Patient Name: Benjamin Cordova Date of Encounter: 10/07/2014  Primary cardiologist: Previously Dr. Coralie Keens   Principal Problem:   Unstable angina Active Problems:   Chest pain   Elevated INR   Leukocytosis   Thrombocytopenia   Chest pain at rest   Coagulopathy   Paroxysmal atrial fibrillation   Essential hypertension    SUBJECTIVE  Continues to have no CP or SOB. Aware of surgery tomorrow, but not sure about the timing.   CURRENT MEDS . aspirin EC  81 mg Oral Daily  . atorvastatin  80 mg Oral q1800  . docusate sodium  100 mg Oral BID  . metoprolol tartrate  25 mg Oral BID  . polyethylene glycol  17 g Oral Daily  . predniSONE  10 mg Oral BID WC  . sodium chloride  3 mL Intravenous Q12H    OBJECTIVE  Filed Vitals:   10/06/14 0453 10/06/14 1500 10/06/14 2100 10/07/14 0500  BP: 153/87 120/69 126/75 135/79  Pulse: 94 101 83 93  Temp: 97.8 F (36.6 C) 98.2 F (36.8 C) 98.2 F (36.8 C) 98.2 F (36.8 C)  TempSrc: Oral Oral Oral Oral  Resp: 16 16 18 18   Height:      Weight: 220 lb (99.791 kg)   219 lb 4.8 oz (99.474 kg)  SpO2: 98% 96% 100% 98%    Intake/Output Summary (Last 24 hours) at 10/07/14 0745 Last data filed at 10/07/14 0059  Gross per 24 hour  Intake    960 ml  Output   1525 ml  Net   -565 ml   Filed Weights   10/05/14 0414 10/06/14 0453 10/07/14 0500  Weight: 219 lb 6.4 oz (99.519 kg) 220 lb (99.791 kg) 219 lb 4.8 oz (99.474 kg)    PHYSICAL EXAM  General: Pleasant, NAD. Neuro: Alert and oriented X 3. Moves all extremities spontaneously. Psych: Normal affect. HEENT:  Normal  Neck: Supple without bruits or JVD. Lungs:  Resp regular and unlabored, CTA. Heart: RRR no s3, s4, or murmurs. Abdomen: Soft, non-tender, non-distended, BS + x 4.  Extremities: No clubbing, cyanosis. DP/PT/Radials 2+ and equal bilaterally. 1+ pitting edema in the LE  Accessory Clinical Findings  CBC  Recent Labs  10/06/14 0450 10/07/14 0235  WBC 8.0 8.9  HGB  12.3* 12.5*  HCT 37.1* 37.7*  MCV 90.3 90.2  PLT 124* 387*   Basic Metabolic Panel  Recent Labs  10/05/14 0418  NA 137  K 4.1  CL 104  CO2 24  GLUCOSE 89  BUN 26*  CREATININE 0.78  CALCIUM 8.9    TELE Sinus tach with HR 100s this morning after pt got up, was in 90s last night    ECG  No new EKG  Echocardiogram 10/01/2014  LV EF: 50% -  55%  ------------------------------------------------------------------- Indications:   Chest pain 786.51.  ------------------------------------------------------------------- Study Conclusions  - Left ventricle: The cavity size was normal. Wall thickness was increased in a pattern of mild LVH. Systolic function was normal. The estimated ejection fraction was in the range of 50% to 55%. There is akinesis of the mid-apicalanteroseptal and apical myocardium. There is akinesis of the apicalinferior myocardium. Doppler parameters are consistent with abnormal left ventricular relaxation (grade 1 diastolic dysfunction). - Mitral valve: There was mild regurgitation. - Right atrium: Central venous pressure (est): 3 mm Hg. - Tricuspid valve: There was trivial regurgitation. - Pulmonary arteries: PA peak pressure: 30 mm Hg (S). - Pericardium, extracardiac: There was no pericardial effusion.  Impressions:  -  Mild LVH with LVEF 50-55%, wall motion abnormalties as outlined above consistent with ischemic heart disease. Grade 1 diastolic dysfunction. Mild mitral regurgitation. Trivial tricuspid regurgitation with PASP 30 mmHg.    Radiology/Studies  Dg Chest Port 1 View  10/01/2014   CLINICAL DATA:  Mid sternal region chest pain for 1 day  EXAM: PORTABLE CHEST - 1 VIEW  COMPARISON:  None.  FINDINGS: There is no edema or consolidation. Heart size and pulmonary vascularity are normal. Patient is status post median sternotomy. No adenopathy. No bone lesions.  IMPRESSION: No edema or consolidation.   Electronically Signed    By: Lowella Grip III M.D.   On: 10/01/2014 09:29    ASSESSMENT AND PLAN  1. Unstable angina  - echocardiogram reveals LVEF 50-55% with mid to apical anteroseptal and apical inferior akinesis  - cath 10/05/2014 severe native CAD with all vein grafts occluded include SVG to diagonal/LAD, SVG to OM1, and SVG to RCA. Patent LIMA which was NOT used previously for bypass. Consider redo CABG  - will hold off on restarting coumadin.   - seen by Dr. Roxan Hockey, planning for redo-CABG Fri 7/8  2. Multivessel CAD s/p CABG x 4 at [place] back in 1981  3. HTN  4. HLD  5. H/o TIA on chronic coumadin  - Patient states Dr. Karie Kirks tried to stop Coumadin at one point and patient had visual changes, so has been on longterm.  - coumadin on hold with IV heparin  Signed, Almyra Deforest PA-C Pager: [phone] As above, patient seen and examined. Brief chest pain while ambulating yesterday evening. Presently pain-free. No dyspnea. Left groin with no hematoma or bruit. Plan for coronary artery bypass and graft tomorrow morning. Continue present cardiac medications. Resume Coumadin following surgery when hemostasis achieved. [name]

## 2014-10-07 NOTE — Progress Notes (Signed)
CARDIAC REHAB PHASE I   Pt up in chair, eating breakfast, states he had an chest pain last night when he got up to go to the bathroom. Pt agreeable to ambulate after breakfast. Returned to see pt, pt HR elevated 130s while standing to use the urinal. Will hold ambulation for now in setting of episode of chest pain and increased heart rate with minimal exertion. Pre-op education completed. Reviewed IS, sternal precautions, activity progression, gave pt cardiac surgery book and surgery guidelines. Pt verbalized understanding. Pt declines preparing for cardiac surgery video at this time. Pt states he lives at home and helps care for his wife, his son and daughter work full time and may not be able to provide 24 hr supervision at discharge. Will need follow-up for discharge plans after surgery.   [id number]  Lenna Sciara, RN, BSN 10/07/2014 8:37 AM

## 2014-10-08 ENCOUNTER — Encounter (HOSPITAL_COMMUNITY): Payer: Self-pay | Admitting: Anesthesiology

## 2014-10-08 ENCOUNTER — Inpatient Hospital Stay (HOSPITAL_COMMUNITY): Payer: Commercial Managed Care - HMO | Admitting: Certified Registered Nurse Anesthetist

## 2014-10-08 ENCOUNTER — Inpatient Hospital Stay (HOSPITAL_COMMUNITY): Payer: Commercial Managed Care - HMO

## 2014-10-08 ENCOUNTER — Encounter (HOSPITAL_COMMUNITY)
Admission: EM | Disposition: A | Discharge status: Skilled Nursing Facility | Payer: Commercial Managed Care - HMO | Source: Home / Self Care | Attending: Thoracic Surgery (Cardiothoracic Vascular Surgery)

## 2014-10-08 DIAGNOSIS — Z951 Presence of aortocoronary bypass graft: Secondary | ICD-10-CM

## 2014-10-08 DIAGNOSIS — I25110 Atherosclerotic heart disease of native coronary artery with unstable angina pectoris: Secondary | ICD-10-CM

## 2014-10-08 HISTORY — PX: TEE WITHOUT CARDIOVERSION: SHX5443

## 2014-10-08 HISTORY — PX: CORONARY ARTERY BYPASS GRAFT: SHX141

## 2014-10-08 HISTORY — PX: RADIAL ARTERY HARVEST: SHX5067

## 2014-10-08 LAB — POCT I-STAT, CHEM 8
BUN: 21 mg/dL — ABNORMAL HIGH (ref 6–20)
BUN: 21 mg/dL — AB (ref 6–20)
BUN: 16 mg/dL (ref 6–20)
BUN: 20 mg/dL (ref 6–20)
BUN: 20 mg/dL (ref 6–20)
BUN: 15 mg/dL (ref 6–20)
CALCIUM ION: 1.26 mmol/L (ref 1.13–1.30)
CALCIUM ION: 0.97 mmol/L — AB (ref 1.13–1.30)
CALCIUM ION: 1.03 mmol/L — AB (ref 1.13–1.30)
CHLORIDE: 99 mmol/L — AB (ref 101–111)
CHLORIDE: 100 mmol/L — AB (ref 101–111)
CREATININE: 0.7 mg/dL (ref 0.61–1.24)
CREATININE: 0.6 mg/dL — AB (ref 0.61–1.24)
CREATININE: 0.7 mg/dL (ref 0.61–1.24)
CREATININE: 0.7 mg/dL (ref 0.61–1.24)
Calcium, Ion: 1.2 mmol/L (ref 1.13–1.30)
Calcium, Ion: 0.91 mmol/L — ABNORMAL LOW (ref 1.13–1.30)
Calcium, Ion: 1.22 mmol/L (ref 1.13–1.30)
Chloride: 102 mmol/L (ref 101–111)
Chloride: 102 mmol/L (ref 101–111)
Chloride: 104 mmol/L (ref 101–111)
Chloride: 101 mmol/L (ref 101–111)
Creatinine, Ser: 0.7 mg/dL (ref 0.61–1.24)
Creatinine, Ser: 0.5 mg/dL — ABNORMAL LOW (ref 0.61–1.24)
GLUCOSE: 100 mg/dL — AB (ref 65–99)
GLUCOSE: 203 mg/dL — AB (ref 65–99)
Glucose, Bld: 117 mg/dL — ABNORMAL HIGH (ref 65–99)
Glucose, Bld: 112 mg/dL — ABNORMAL HIGH (ref 65–99)
Glucose, Bld: 167 mg/dL — ABNORMAL HIGH (ref 65–99)
Glucose, Bld: 118 mg/dL — ABNORMAL HIGH (ref 65–99)
HCT: 29 % — ABNORMAL LOW (ref 39.0–52.0)
HCT: 20 % — ABNORMAL LOW (ref 39.0–52.0)
HCT: 21 % — ABNORMAL LOW (ref 39.0–52.0)
HCT: 24 % — ABNORMAL LOW (ref 39.0–52.0)
HEMATOCRIT: 31 % — AB (ref 39.0–52.0)
HEMATOCRIT: 30 % — AB (ref 39.0–52.0)
HEMOGLOBIN: 10.5 g/dL — AB (ref 13.0–17.0)
HEMOGLOBIN: 7.1 g/dL — AB (ref 13.0–17.0)
HEMOGLOBIN: 8.2 g/dL — AB (ref 13.0–17.0)
HEMOGLOBIN: 10.2 g/dL — AB (ref 13.0–17.0)
Hemoglobin: 9.9 g/dL — ABNORMAL LOW (ref 13.0–17.0)
Hemoglobin: 6.8 g/dL — CL (ref 13.0–17.0)
POTASSIUM: 3.8 mmol/L (ref 3.5–5.1)
POTASSIUM: 4.1 mmol/L (ref 3.5–5.1)
POTASSIUM: 4.5 mmol/L (ref 3.5–5.1)
POTASSIUM: 4 mmol/L (ref 3.5–5.1)
Potassium: 3.8 mmol/L (ref 3.5–5.1)
Potassium: 5.9 mmol/L — ABNORMAL HIGH (ref 3.5–5.1)
SODIUM: 135 mmol/L (ref 135–145)
SODIUM: 130 mmol/L — AB (ref 135–145)
Sodium: 137 mmol/L (ref 135–145)
Sodium: 131 mmol/L — ABNORMAL LOW (ref 135–145)
Sodium: 135 mmol/L (ref 135–145)
Sodium: 137 mmol/L (ref 135–145)
TCO2: 27 mmol/L (ref 0–100)
TCO2: 28 mmol/L (ref 0–100)
TCO2: 25 mmol/L (ref 0–100)
TCO2: 31 mmol/L (ref 0–100)
TCO2: 24 mmol/L (ref 0–100)
TCO2: 23 mmol/L (ref 0–100)

## 2014-10-08 LAB — POCT I-STAT 4, (NA,K, GLUC, HGB,HCT)
GLUCOSE: 156 mg/dL — AB (ref 65–99)
HEMATOCRIT: 32 % — AB (ref 39.0–52.0)
HEMOGLOBIN: 10.9 g/dL — AB (ref 13.0–17.0)
POTASSIUM: 4.2 mmol/L (ref 3.5–5.1)
SODIUM: 138 mmol/L (ref 135–145)

## 2014-10-08 LAB — PROTIME-INR
INR: 1.05 (ref 0.00–1.49)
INR: 1.55 — ABNORMAL HIGH (ref 0.00–1.49)
PROTHROMBIN TIME: 13.9 s (ref 11.6–15.2)
Prothrombin Time: 18.7 seconds — ABNORMAL HIGH (ref 11.6–15.2)

## 2014-10-08 LAB — CBC
HCT: 36.2 % — ABNORMAL LOW (ref 39.0–52.0)
HEMATOCRIT: 30.7 % — AB (ref 39.0–52.0)
HEMATOCRIT: 25.4 % — AB (ref 39.0–52.0)
HEMOGLOBIN: 10.5 g/dL — AB (ref 13.0–17.0)
Hemoglobin: 12.4 g/dL — ABNORMAL LOW (ref 13.0–17.0)
Hemoglobin: 8.9 g/dL — ABNORMAL LOW (ref 13.0–17.0)
MCH: 30.8 pg (ref 26.0–34.0)
MCH: 30.4 pg (ref 26.0–34.0)
MCH: 30.6 pg (ref 26.0–34.0)
MCHC: 34.3 g/dL (ref 30.0–36.0)
MCHC: 34.2 g/dL (ref 30.0–36.0)
MCHC: 35 g/dL (ref 30.0–36.0)
MCV: 90 fL (ref 78.0–100.0)
MCV: 89 fL (ref 78.0–100.0)
MCV: 87.3 fL (ref 78.0–100.0)
PLATELETS: 123 10*3/uL — AB (ref 150–400)
PLATELETS: 105 10*3/uL — AB (ref 150–400)
Platelets: 104 10*3/uL — ABNORMAL LOW (ref 150–400)
RBC: 4.02 MIL/uL — AB (ref 4.22–5.81)
RBC: 3.45 MIL/uL — AB (ref 4.22–5.81)
RBC: 2.91 MIL/uL — ABNORMAL LOW (ref 4.22–5.81)
RDW: 13.4 % (ref 11.5–15.5)
RDW: 13.8 % (ref 11.5–15.5)
RDW: 14.1 % (ref 11.5–15.5)
WBC: 10 10*3/uL (ref 4.0–10.5)
WBC: 23.8 10*3/uL — ABNORMAL HIGH (ref 4.0–10.5)
WBC: 16.8 10*3/uL — ABNORMAL HIGH (ref 4.0–10.5)

## 2014-10-08 LAB — POCT I-STAT 3, ART BLOOD GAS (G3+)
ACID-BASE DEFICIT: 1 mmol/L (ref 0.0–2.0)
Acid-Base Excess: 2 mmol/L (ref 0.0–2.0)
Acid-base deficit: 1 mmol/L (ref 0.0–2.0)
Acid-base deficit: 2 mmol/L (ref 0.0–2.0)
BICARBONATE: 23.6 meq/L (ref 20.0–24.0)
BICARBONATE: 24.8 meq/L — AB (ref 20.0–24.0)
BICARBONATE: 24.1 meq/L — AB (ref 20.0–24.0)
Bicarbonate: 28.1 mEq/L — ABNORMAL HIGH (ref 20.0–24.0)
Bicarbonate: 23.8 mEq/L (ref 20.0–24.0)
O2 SAT: 100 %
O2 Saturation: 100 %
O2 Saturation: 99 %
O2 Saturation: 99 %
O2 Saturation: 98 %
PCO2 ART: 34.1 mmHg — AB (ref 35.0–45.0)
PCO2 ART: 46 mmHg — AB (ref 35.0–45.0)
PCO2 ART: 41.6 mmHg (ref 35.0–45.0)
PH ART: 7.366 (ref 7.350–7.450)
PH ART: 7.449 (ref 7.350–7.450)
PH ART: 7.339 — AB (ref 7.350–7.450)
PH ART: 7.366 (ref 7.350–7.450)
PH ART: 7.372 (ref 7.350–7.450)
PO2 ART: 156 mmHg — AB (ref 80.0–100.0)
TCO2: 30 mmol/L (ref 0–100)
TCO2: 25 mmol/L (ref 0–100)
TCO2: 26 mmol/L (ref 0–100)
TCO2: 25 mmol/L (ref 0–100)
TCO2: 25 mmol/L (ref 0–100)
pCO2 arterial: 49.1 mmHg — ABNORMAL HIGH (ref 35.0–45.0)
pCO2 arterial: 40.9 mmHg (ref 35.0–45.0)
pO2, Arterial: 434 mmHg — ABNORMAL HIGH (ref 80.0–100.0)
pO2, Arterial: 415 mmHg — ABNORMAL HIGH (ref 80.0–100.0)
pO2, Arterial: 167 mmHg — ABNORMAL HIGH (ref 80.0–100.0)
pO2, Arterial: 110 mmHg — ABNORMAL HIGH (ref 80.0–100.0)

## 2014-10-08 LAB — BASIC METABOLIC PANEL
Anion gap: 8 (ref 5–15)
BUN: 19 mg/dL (ref 6–20)
CALCIUM: 8.9 mg/dL (ref 8.9–10.3)
CO2: 27 mmol/L (ref 22–32)
Chloride: 101 mmol/L (ref 101–111)
Creatinine, Ser: 0.83 mg/dL (ref 0.61–1.24)
GFR calc non Af Amer: >60 mL/min (ref >60)
Glucose, Bld: 115 mg/dL — ABNORMAL HIGH (ref 65–99)
POTASSIUM: 3.9 mmol/L (ref 3.5–5.1)
SODIUM: 136 mmol/L (ref 135–145)

## 2014-10-08 LAB — HEPARIN LEVEL (UNFRACTIONATED): Heparin Unfractionated: 0.55 IU/mL (ref 0.30–0.70)

## 2014-10-08 LAB — PREPARE RBC (CROSSMATCH)

## 2014-10-08 LAB — HEMOGLOBIN AND HEMATOCRIT, BLOOD
HCT: 25 % — ABNORMAL LOW (ref 39.0–52.0)
Hemoglobin: 8.5 g/dL — ABNORMAL LOW (ref 13.0–17.0)

## 2014-10-08 LAB — APTT: aPTT: 31 seconds (ref 24–37)

## 2014-10-08 LAB — PLATELET COUNT: Platelets: 117 10*3/uL — ABNORMAL LOW (ref 150–400)

## 2014-10-08 SURGERY — REDO CORONARY ARTERY BYPASS GRAFTING (CABG)
Anesthesia: General | Site: Chest

## 2014-10-08 MED ORDER — ROCURONIUM BROMIDE 100 MG/10ML IV SOLN
INTRAVENOUS | Status: DC | PRN
  Administered 2014-10-08: 40 mg via INTRAVENOUS
  Administered 2014-10-08: 20 mg via INTRAVENOUS
  Administered 2014-10-08: 30 mg via INTRAVENOUS
  Administered 2014-10-08: 50 mg via INTRAVENOUS
  Administered 2014-10-08: 30 mg via INTRAVENOUS

## 2014-10-08 MED ORDER — PROPOFOL 10 MG/ML IV BOLUS
INTRAVENOUS | Status: AC
  Filled 2014-10-08: qty 20

## 2014-10-08 MED ORDER — LACTATED RINGERS IV SOLN: INTRAVENOUS | Status: DC

## 2014-10-08 MED ORDER — SODIUM CHLORIDE 0.9 % IJ SOLN: 3.0000 mL | INTRAMUSCULAR | Status: DC | PRN

## 2014-10-08 MED ORDER — ACETAMINOPHEN 500 MG PO TABS
1000.0000 mg | ORAL_TABLET | Freq: Four times a day (QID) | ORAL | Status: AC
  Administered 2014-10-09 – 2014-10-13 (×19): 1000 mg via ORAL
  Filled 2014-10-08 (×19): qty 2

## 2014-10-08 MED ORDER — CALCIUM CHLORIDE 10 % IV SOLN
1.0000 g | Freq: Once | INTRAVENOUS | Status: AC
  Administered 2014-10-08: 1 g via INTRAVENOUS

## 2014-10-08 MED ORDER — LIDOCAINE HCL (CARDIAC) 20 MG/ML IV SOLN
INTRAVENOUS | Status: DC | PRN
  Administered 2014-10-08: 60 mg via INTRAVENOUS

## 2014-10-08 MED ORDER — DOPAMINE-DEXTROSE 3.2-5 MG/ML-% IV SOLN
0.0000 ug/kg/min | INTRAVENOUS | Status: DC
Start: 2014-10-08 — End: 2014-10-10

## 2014-10-08 MED ORDER — SODIUM CHLORIDE 0.9 % IJ SOLN
3.0000 mL | Freq: Two times a day (BID) | INTRAMUSCULAR | Status: DC
  Administered 2014-10-09: 3 mL via INTRAVENOUS
  Administered 2014-10-09: 10 mL via INTRAVENOUS
  Administered 2014-10-10 (×2): 3 mL via INTRAVENOUS
  Administered 2014-10-11: 10 mL via INTRAVENOUS

## 2014-10-08 MED ORDER — ACETAMINOPHEN 650 MG RE SUPP
650.0000 mg | Freq: Once | RECTAL | Status: AC
Start: 2014-10-08 — End: 2014-10-08
  Administered 2014-10-08: 650 mg via RECTAL

## 2014-10-08 MED ORDER — METOPROLOL TARTRATE 1 MG/ML IV SOLN
2.5000 mg | INTRAVENOUS | Status: DC | PRN
  Administered 2014-10-09 – 2014-10-11 (×3): 2.5 mg via INTRAVENOUS
  Filled 2014-10-08 (×3): qty 5

## 2014-10-08 MED ORDER — OXYCODONE HCL 5 MG PO TABS
5.0000 mg | ORAL_TABLET | ORAL | Status: DC | PRN
  Administered 2014-10-09: 5 mg via ORAL
  Administered 2014-10-09 – 2014-10-10 (×4): 10 mg via ORAL
  Filled 2014-10-08 (×2): qty 2
  Filled 2014-10-08: qty 1
  Filled 2014-10-08 (×2): qty 2

## 2014-10-08 MED ORDER — PROPOFOL 10 MG/ML IV BOLUS
INTRAVENOUS | Status: DC | PRN
  Administered 2014-10-08: 60 mg via INTRAVENOUS

## 2014-10-08 MED ORDER — ALBUMIN HUMAN 5 % IV SOLN
INTRAVENOUS | Status: DC | PRN
  Administered 2014-10-08 (×2): via INTRAVENOUS

## 2014-10-08 MED ORDER — PHENYLEPHRINE HCL 10 MG/ML IJ SOLN
10.0000 mg | INTRAVENOUS | Status: DC | PRN
  Administered 2014-10-08: 20 ug/min via INTRAVENOUS

## 2014-10-08 MED ORDER — SODIUM CHLORIDE 0.9 % IV SOLN
200.0000 ug | INTRAVENOUS | Status: DC | PRN
  Administered 2014-10-08: 0.2 ug/kg/h via INTRAVENOUS

## 2014-10-08 MED ORDER — SODIUM CHLORIDE 0.9 % IV SOLN
250.0000 [IU] | INTRAVENOUS | Status: DC | PRN
  Administered 2014-10-08: 1 [IU]/h via INTRAVENOUS

## 2014-10-08 MED ORDER — FENTANYL CITRATE (PF) 250 MCG/5ML IJ SOLN
INTRAMUSCULAR | Status: AC
  Filled 2014-10-08: qty 5

## 2014-10-08 MED ORDER — DEXTROSE 5 % IV SOLN
1.5000 g | Freq: Two times a day (BID) | INTRAVENOUS | Status: AC
  Administered 2014-10-08 – 2014-10-10 (×4): 1.5 g via INTRAVENOUS
  Filled 2014-10-08 (×4): qty 1.5

## 2014-10-08 MED ORDER — SODIUM CHLORIDE 0.9 % IV SOLN
INTRAVENOUS | Status: DC
  Administered 2014-10-08: 2.9 [IU]/h via INTRAVENOUS
  Filled 2014-10-08: qty 2.5

## 2014-10-08 MED ORDER — SODIUM CHLORIDE 0.9 % IV SOLN: Freq: Once | INTRAVENOUS | Status: DC

## 2014-10-08 MED ORDER — DEXMEDETOMIDINE HCL IN NACL 200 MCG/50ML IV SOLN
0.0000 ug/kg/h | INTRAVENOUS | Status: DC
Start: 2014-10-08 — End: 2014-10-10
  Filled 2014-10-08: qty 50

## 2014-10-08 MED ORDER — PANTOPRAZOLE SODIUM 40 MG PO TBEC
40.0000 mg | DELAYED_RELEASE_TABLET | Freq: Every day | ORAL | Status: DC
  Administered 2014-10-10 – 2014-10-14 (×5): 40 mg via ORAL
  Filled 2014-10-08 (×5): qty 1

## 2014-10-08 MED ORDER — DOPAMINE-DEXTROSE 3.2-5 MG/ML-% IV SOLN
INTRAVENOUS | Status: DC | PRN
  Administered 2014-10-08: 3 ug/kg/min via INTRAVENOUS

## 2014-10-08 MED ORDER — ACETAMINOPHEN 160 MG/5ML PO SOLN
1000.0000 mg | Freq: Four times a day (QID) | ORAL | Status: AC
  Administered 2014-10-08: 1000 mg
  Filled 2014-10-08: qty 40.6

## 2014-10-08 MED ORDER — INSULIN REGULAR BOLUS VIA INFUSION
0.0000 [IU] | Freq: Three times a day (TID) | INTRAVENOUS | Status: DC
  Filled 2014-10-08: qty 10

## 2014-10-08 MED ORDER — NITROGLYCERIN IN D5W 200-5 MCG/ML-% IV SOLN: 0.0000 ug/min | INTRAVENOUS | Status: DC

## 2014-10-08 MED ORDER — SODIUM CHLORIDE 0.9 % IR SOLN
Status: DC | PRN
  Administered 2014-10-08: 5000 mL

## 2014-10-08 MED ORDER — ONDANSETRON HCL 4 MG/2ML IJ SOLN: 4.0000 mg | Freq: Four times a day (QID) | INTRAMUSCULAR | Status: DC | PRN

## 2014-10-08 MED ORDER — LACTATED RINGERS IV SOLN: 500.0000 mL | Freq: Once | INTRAVENOUS | Status: AC | PRN

## 2014-10-08 MED ORDER — DOCUSATE SODIUM 100 MG PO CAPS
200.0000 mg | ORAL_CAPSULE | Freq: Every day | ORAL | Status: DC
Start: 2014-10-09 — End: 2014-10-14
  Administered 2014-10-09 – 2014-10-14 (×5): 200 mg via ORAL
  Filled 2014-10-08 (×5): qty 2

## 2014-10-08 MED ORDER — MORPHINE SULFATE 2 MG/ML IJ SOLN
2.0000 mg | INTRAMUSCULAR | Status: DC | PRN
  Administered 2014-10-09 (×2): 2 mg via INTRAVENOUS
  Filled 2014-10-08 (×2): qty 2

## 2014-10-08 MED ORDER — LACTATED RINGERS IV SOLN
INTRAVENOUS | Status: DC
  Administered 2014-10-08 (×4): via INTRAVENOUS

## 2014-10-08 MED ORDER — PROTAMINE SULFATE 10 MG/ML IV SOLN
INTRAVENOUS | Status: DC | PRN
  Administered 2014-10-08: 60 mg via INTRAVENOUS
  Administered 2014-10-08: 50 mg via INTRAVENOUS
  Administered 2014-10-08: 30 mg via INTRAVENOUS
  Administered 2014-10-08: 50 mg via INTRAVENOUS
  Administered 2014-10-08: 30 mg via INTRAVENOUS

## 2014-10-08 MED ORDER — SUCCINYLCHOLINE CHLORIDE 20 MG/ML IJ SOLN
INTRAMUSCULAR | Status: DC | PRN
  Administered 2014-10-08: 100 mg via INTRAVENOUS

## 2014-10-08 MED ORDER — MIDAZOLAM HCL 2 MG/2ML IJ SOLN
INTRAMUSCULAR | Status: AC
  Filled 2014-10-08: qty 2

## 2014-10-08 MED ORDER — PHENYLEPHRINE HCL 10 MG/ML IJ SOLN
0.0000 ug/min | INTRAVENOUS | Status: DC
  Filled 2014-10-08: qty 2

## 2014-10-08 MED ORDER — MIDAZOLAM HCL 5 MG/5ML IJ SOLN
INTRAMUSCULAR | Status: DC | PRN
  Administered 2014-10-08: 3 mg via INTRAVENOUS
  Administered 2014-10-08: 5 mg via INTRAVENOUS
  Administered 2014-10-08: 2 mg via INTRAVENOUS

## 2014-10-08 MED ORDER — SODIUM CHLORIDE 0.45 % IV SOLN
INTRAVENOUS | Status: DC | PRN
  Administered 2014-10-08: 20 mL/h via INTRAVENOUS

## 2014-10-08 MED ORDER — CHLORHEXIDINE GLUCONATE 0.12 % MT SOLN
OROMUCOSAL | Status: AC
  Administered 2014-10-08: 15 mL
  Filled 2014-10-08: qty 15

## 2014-10-08 MED ORDER — ALBUMIN HUMAN 5 % IV SOLN
250.0000 mL | INTRAVENOUS | Status: AC | PRN
  Administered 2014-10-08 (×4): 250 mL via INTRAVENOUS
  Filled 2014-10-08 (×2): qty 250

## 2014-10-08 MED ORDER — SODIUM CHLORIDE 0.9 % IV SOLN: INTRAVENOUS | Status: DC

## 2014-10-08 MED ORDER — METOPROLOL TARTRATE 25 MG/10 ML ORAL SUSPENSION
12.5000 mg | Freq: Two times a day (BID) | ORAL | Status: DC
Start: 2014-10-08 — End: 2014-10-09
  Filled 2014-10-08 (×3): qty 5

## 2014-10-08 MED ORDER — PHENYLEPHRINE 40 MCG/ML (10ML) SYRINGE FOR IV PUSH (FOR BLOOD PRESSURE SUPPORT)
PREFILLED_SYRINGE | INTRAVENOUS | Status: AC
  Filled 2014-10-08: qty 20

## 2014-10-08 MED ORDER — STERILE WATER FOR INJECTION IJ SOLN
INTRAMUSCULAR | Status: AC
  Filled 2014-10-08: qty 10

## 2014-10-08 MED ORDER — ROCURONIUM BROMIDE 50 MG/5ML IV SOLN
INTRAVENOUS | Status: AC
  Filled 2014-10-08: qty 2

## 2014-10-08 MED ORDER — POTASSIUM CHLORIDE 10 MEQ/50ML IV SOLN: 10.0000 meq | INTRAVENOUS | Status: AC

## 2014-10-08 MED ORDER — HEMOSTATIC AGENTS (NO CHARGE) OPTIME
TOPICAL | Status: DC | PRN
  Administered 2014-10-08: 3 via TOPICAL
  Administered 2014-10-08 (×2): 1 via TOPICAL
  Administered 2014-10-08: 2 via TOPICAL

## 2014-10-08 MED ORDER — HEPARIN SODIUM (PORCINE) 1000 UNIT/ML IJ SOLN
INTRAMUSCULAR | Status: AC
  Filled 2014-10-08: qty 1

## 2014-10-08 MED ORDER — SODIUM CHLORIDE 0.9 % IV SOLN
250.0000 mL | INTRAVENOUS | Status: DC
  Administered 2014-10-09: 250 mL via INTRAVENOUS

## 2014-10-08 MED ORDER — TRAMADOL HCL 50 MG PO TABS
50.0000 mg | ORAL_TABLET | ORAL | Status: DC | PRN
  Administered 2014-10-10 – 2014-10-11 (×2): 100 mg via ORAL
  Filled 2014-10-08 (×2): qty 2

## 2014-10-08 MED ORDER — LACTATED RINGERS IV SOLN
INTRAVENOUS | Status: DC | PRN
  Administered 2014-10-08 (×2): via INTRAVENOUS

## 2014-10-08 MED ORDER — ASPIRIN EC 325 MG PO TBEC
325.0000 mg | DELAYED_RELEASE_TABLET | Freq: Every day | ORAL | Status: DC
  Administered 2014-10-09 – 2014-10-10 (×2): 325 mg via ORAL
  Filled 2014-10-08 (×4): qty 1

## 2014-10-08 MED ORDER — ALBUMIN HUMAN 5 % IV SOLN
12.5000 g | Freq: Once | INTRAVENOUS | Status: DC
Start: 2014-10-08 — End: 2014-10-12

## 2014-10-08 MED ORDER — ACETAMINOPHEN 160 MG/5ML PO SOLN: 650.0000 mg | Freq: Once | ORAL | Status: AC

## 2014-10-08 MED ORDER — CHLORHEXIDINE GLUCONATE 0.12 % MT SOLN: 15.0000 mL | Freq: Two times a day (BID) | OROMUCOSAL | Status: DC

## 2014-10-08 MED ORDER — BISACODYL 5 MG PO TBEC
10.0000 mg | DELAYED_RELEASE_TABLET | Freq: Every day | ORAL | Status: DC
  Administered 2014-10-09 – 2014-10-14 (×4): 10 mg via ORAL
  Filled 2014-10-08 (×5): qty 2

## 2014-10-08 MED ORDER — METOPROLOL TARTRATE 12.5 MG HALF TABLET
12.5000 mg | ORAL_TABLET | Freq: Two times a day (BID) | ORAL | Status: DC
  Administered 2014-10-09: 12.5 mg via ORAL
  Filled 2014-10-08 (×3): qty 1

## 2014-10-08 MED ORDER — FAMOTIDINE IN NACL 20-0.9 MG/50ML-% IV SOLN
20.0000 mg | Freq: Two times a day (BID) | INTRAVENOUS | Status: AC
  Administered 2014-10-08: 20 mg via INTRAVENOUS

## 2014-10-08 MED ORDER — SODIUM CHLORIDE 0.9 % IV SOLN
10.0000 g | INTRAVENOUS | Status: DC | PRN
  Administered 2014-10-08: 5 g/h via INTRAVENOUS

## 2014-10-08 MED ORDER — BISACODYL 10 MG RE SUPP: 10.0000 mg | Freq: Every day | RECTAL | Status: DC

## 2014-10-08 MED ORDER — SODIUM CHLORIDE 0.9 % IV SOLN
INTRAVENOUS | Status: DC
  Filled 2014-10-08: qty 20

## 2014-10-08 MED ORDER — NITROGLYCERIN IN D5W 200-5 MCG/ML-% IV SOLN
INTRAVENOUS | Status: DC | PRN
  Administered 2014-10-08: 5 ug/min via INTRAVENOUS

## 2014-10-08 MED ORDER — ASPIRIN 81 MG PO CHEW: 324.0000 mg | CHEWABLE_TABLET | Freq: Every day | ORAL | Status: DC

## 2014-10-08 MED ORDER — HEPARIN SODIUM (PORCINE) 1000 UNIT/ML IJ SOLN
INTRAMUSCULAR | Status: DC | PRN
  Administered 2014-10-08: 5 mL via INTRAVENOUS
  Administered 2014-10-08: 7 mL via INTRAVENOUS
  Administered 2014-10-08 (×2): 10 mL via INTRAVENOUS
  Administered 2014-10-08: 2 mL via INTRAVENOUS

## 2014-10-08 MED ORDER — MAGNESIUM SULFATE 4 GM/100ML IV SOLN
4.0000 g | Freq: Once | INTRAVENOUS | Status: AC
  Administered 2014-10-08: 4 g via INTRAVENOUS
  Filled 2014-10-08: qty 100

## 2014-10-08 MED ORDER — PROTAMINE SULFATE 10 MG/ML IV SOLN
INTRAVENOUS | Status: AC
  Filled 2014-10-08: qty 25

## 2014-10-08 MED ORDER — MIDAZOLAM HCL 2 MG/2ML IJ SOLN: 2.0000 mg | INTRAMUSCULAR | Status: DC | PRN

## 2014-10-08 MED ORDER — CALCIUM CHLORIDE 10 % IV SOLN
INTRAVENOUS | Status: DC | PRN
  Administered 2014-10-08 (×2): 100 mg via INTRAVENOUS
  Administered 2014-10-08: 200 mg via INTRAVENOUS

## 2014-10-08 MED ORDER — MIDAZOLAM HCL 10 MG/2ML IJ SOLN
INTRAMUSCULAR | Status: AC
  Filled 2014-10-08: qty 2

## 2014-10-08 MED ORDER — ARTIFICIAL TEARS OP OINT
TOPICAL_OINTMENT | OPHTHALMIC | Status: AC
  Filled 2014-10-08: qty 3.5

## 2014-10-08 MED ORDER — CETYLPYRIDINIUM CHLORIDE 0.05 % MT LIQD
7.0000 mL | Freq: Four times a day (QID) | OROMUCOSAL | Status: DC
  Administered 2014-10-09 (×2): 7 mL via OROMUCOSAL

## 2014-10-08 MED ORDER — FENTANYL CITRATE (PF) 100 MCG/2ML IJ SOLN
INTRAMUSCULAR | Status: DC | PRN
  Administered 2014-10-08 (×2): 250 ug via INTRAVENOUS
  Administered 2014-10-08: 50 ug via INTRAVENOUS
  Administered 2014-10-08: 150 ug via INTRAVENOUS
  Administered 2014-10-08: 100 ug via INTRAVENOUS
  Administered 2014-10-08: 50 ug via INTRAVENOUS
  Administered 2014-10-08: 100 ug via INTRAVENOUS
  Administered 2014-10-08: 50 ug via INTRAVENOUS
  Administered 2014-10-08: 500 ug via INTRAVENOUS

## 2014-10-08 MED ORDER — EPHEDRINE SULFATE 50 MG/ML IJ SOLN
INTRAMUSCULAR | Status: AC
  Filled 2014-10-08: qty 1

## 2014-10-08 MED ORDER — MORPHINE SULFATE 2 MG/ML IJ SOLN
1.0000 mg | INTRAMUSCULAR | Status: AC | PRN
  Administered 2014-10-08: 4 mg via INTRAVENOUS
  Administered 2014-10-08: 2 mg via INTRAVENOUS
  Filled 2014-10-08: qty 1

## 2014-10-08 MED ORDER — VANCOMYCIN HCL IN DEXTROSE 1-5 GM/200ML-% IV SOLN
1000.0000 mg | Freq: Once | INTRAVENOUS | Status: AC
  Administered 2014-10-08: 1000 mg via INTRAVENOUS
  Filled 2014-10-08: qty 200

## 2014-10-08 MED ORDER — FENTANYL CITRATE (PF) 100 MCG/2ML IJ SOLN
INTRAMUSCULAR | Status: AC
  Administered 2014-10-08: 100 ug
  Filled 2014-10-08: qty 2

## 2014-10-08 SURGICAL SUPPLY — 112 items
ADAPTER CARDIO PERF ANTE/RETRO (ADAPTER) IMPLANT
ADPR PRFSN 84XANTGRD RTRGD (ADAPTER)
APPLIER CLIP 9.375 MED OPEN (MISCELLANEOUS)
APPLIER CLIP 9.375 SM OPEN (CLIP)
APR CLP MED 9.3 20 MLT OPN (MISCELLANEOUS)
APR CLP SM 9.3 20 MLT OPN (CLIP)
BAG DECANTER FOR FLEXI CONT (MISCELLANEOUS) ×5 IMPLANT
BANDAGE ELASTIC 4 VELCRO ST LF (GAUZE/BANDAGES/DRESSINGS) ×5 IMPLANT
BANDAGE ELASTIC 6 VELCRO ST LF (GAUZE/BANDAGES/DRESSINGS) ×3 IMPLANT
BLADE CORE FAN STRYKER (BLADE) ×10 IMPLANT
BLADE SAW SAG 29X58X.64 (BLADE) ×2 IMPLANT
BLADE STERNUM SYSTEM 6 (BLADE) ×5 IMPLANT
BLADE SURG 15 STRL LF DISP TIS (BLADE) IMPLANT
BLADE SURG 15 STRL SS (BLADE) ×5
BLADE SURG ROTATE 9660 (MISCELLANEOUS) ×2 IMPLANT
BNDG GAUZE ELAST 4 BULKY (GAUZE/BANDAGES/DRESSINGS) ×5 IMPLANT
CANISTER SUCTION 2500CC (MISCELLANEOUS) ×5 IMPLANT
CANNULA GUNDRY RCSP 15FR (MISCELLANEOUS) IMPLANT
CATH ROBINSON RED A/P 18FR (CATHETERS) ×7 IMPLANT
CATH THORACIC 36FR RT ANG (CATHETERS) ×2 IMPLANT
CLIP APPLIE 9.375 MED OPEN (MISCELLANEOUS) IMPLANT
CLIP APPLIE 9.375 SM OPEN (CLIP) IMPLANT
CLIP FOGARTY SPRING 6M (CLIP) ×6 IMPLANT
CLIP TI MEDIUM 24 (CLIP) ×4 IMPLANT
CLIP TI WIDE RED SMALL 24 (CLIP) ×10 IMPLANT
CONN 3/8X1/2 ST GISH (MISCELLANEOUS) ×4 IMPLANT
CONN ST 1/4X3/8  BEN (MISCELLANEOUS) ×2
CONN ST 1/4X3/8 BEN (MISCELLANEOUS) IMPLANT
CONN Y 3/8X3/8X3/8  BEN (MISCELLANEOUS)
CONN Y 3/8X3/8X3/8 BEN (MISCELLANEOUS) IMPLANT
CONNECTOR 1/2X3/8X1/2 3 WAY (MISCELLANEOUS) ×2
CONNECTOR 1/2X3/8X1/2 3WAY (MISCELLANEOUS) IMPLANT
COVER BACK TABLE 60X90IN (DRAPES) ×2 IMPLANT
CRADLE DONUT ADULT HEAD (MISCELLANEOUS) ×5 IMPLANT
DRAIN CHANNEL 28F RND 3/8 FF (WOUND CARE) ×2 IMPLANT
DRAPE CARDIOVASCULAR INCISE (DRAPES) ×5
DRAPE EXTREMITY T 121X128X90 (DRAPE) ×2 IMPLANT
DRAPE PROXIMA HALF (DRAPES) ×6 IMPLANT
DRAPE SLUSH/WARMER DISC (DRAPES) ×2 IMPLANT
DRAPE SRG 135X102X78XABS (DRAPES) ×3 IMPLANT
DRSG COVADERM 4X14 (GAUZE/BANDAGES/DRESSINGS) ×5 IMPLANT
ELECT REM PT RETURN 9FT ADLT (ELECTROSURGICAL) ×10
ELECTRODE REM PT RTRN 9FT ADLT (ELECTROSURGICAL) ×6 IMPLANT
FEMORAL VENOUS CANN RAP (CANNULA) ×2 IMPLANT
GAUZE SPONGE 4X4 12PLY STRL (GAUZE/BANDAGES/DRESSINGS) ×6 IMPLANT
GLOVE BIO SURGEON STRL SZ7.5 (GLOVE) ×2 IMPLANT
GLOVE SURG SIGNA 7.5 PF LTX (GLOVE) ×6 IMPLANT
GOWN STRL REUS W/ TWL LRG LVL3 (GOWN DISPOSABLE) ×12 IMPLANT
GOWN STRL REUS W/ TWL XL LVL3 (GOWN DISPOSABLE) ×6 IMPLANT
GOWN STRL REUS W/TWL LRG LVL3 (GOWN DISPOSABLE) ×30
GOWN STRL REUS W/TWL XL LVL3 (GOWN DISPOSABLE) ×10
HARMONIC SHEARS 14CM COAG (MISCELLANEOUS) ×2 IMPLANT
HEMOSTAT POWDER SURGIFOAM 1G (HEMOSTASIS) ×10 IMPLANT
HEMOSTAT SURGICEL 2X14 (HEMOSTASIS) ×4 IMPLANT
INSERT FOGARTY XLG (MISCELLANEOUS) IMPLANT
KIT BASIN OR (CUSTOM PROCEDURE TRAY) ×5 IMPLANT
KIT DILATOR VASC 18G NDL (KITS) ×2 IMPLANT
KIT ROOM TURNOVER OR (KITS) ×5 IMPLANT
KIT SUCTION CATH 14FR (SUCTIONS) ×6 IMPLANT
KIT VASOVIEW W/TROCAR VH 2000 (KITS) ×3 IMPLANT
LIQUID BAND (GAUZE/BANDAGES/DRESSINGS) ×2 IMPLANT
MARKER GRAFT CORONARY BYPASS (MISCELLANEOUS) ×6 IMPLANT
MARKER SKIN DUAL TIP RULER LAB (MISCELLANEOUS) ×2 IMPLANT
NS IRRIG 1000ML POUR BTL (IV SOLUTION) ×22 IMPLANT
PACK OPEN HEART (CUSTOM PROCEDURE TRAY) ×5 IMPLANT
PAD ARMBOARD 7.5X6 YLW CONV (MISCELLANEOUS) ×10 IMPLANT
PAD DEFIB R2 (MISCELLANEOUS) IMPLANT
PAD ELECT DEFIB RADIOL ZOLL (MISCELLANEOUS) ×5 IMPLANT
PATCH HEMASHIELD 8X75 (Vascular Products) ×2 IMPLANT
PEDIATRIC SUCKERS (MISCELLANEOUS) ×2 IMPLANT
PENCIL BUTTON HOLSTER BLD 10FT (ELECTRODE) ×2 IMPLANT
PUNCH AORTIC ROTATE  4.5MM 8IN (MISCELLANEOUS) ×2 IMPLANT
PUNCH AORTIC ROTATE 4.0MM (MISCELLANEOUS) IMPLANT
PUNCH AORTIC ROTATE 4.5MM 8IN (MISCELLANEOUS) IMPLANT
PUNCH AORTIC ROTATE 5MM 8IN (MISCELLANEOUS) IMPLANT
SENSOR MYOCARDIAL TEMP (MISCELLANEOUS) ×2 IMPLANT
SOLUTION ANTI FOG 6CC (MISCELLANEOUS) ×3 IMPLANT
SPONGE GAUZE 4X4 12PLY STER LF (GAUZE/BANDAGES/DRESSINGS) ×6 IMPLANT
SPONGE LAP 18X18 X RAY DECT (DISPOSABLE) ×4 IMPLANT
SPONGE LAP 4X18 X RAY DECT (DISPOSABLE) ×4 IMPLANT
SUT PROLENE 3 0 SH DA (SUTURE) ×2 IMPLANT
SUT PROLENE 4 0 RB 1 (SUTURE) ×5
SUT PROLENE 4 0 SH DA (SUTURE) ×8 IMPLANT
SUT PROLENE 4-0 RB1 .5 CRCL 36 (SUTURE) IMPLANT
SUT PROLENE 5 0 C 1 36 (SUTURE) ×8 IMPLANT
SUT PROLENE 6 0 C 1 30 (SUTURE) ×14 IMPLANT
SUT PROLENE 7 0 BV1 MDA (SUTURE) ×2 IMPLANT
SUT PROLENE 8 0 BV175 6 (SUTURE) ×8 IMPLANT
SUT SILK  1 MH (SUTURE) ×12
SUT SILK 1 MH (SUTURE) IMPLANT
SUT SILK 1 TIES 10X30 (SUTURE) ×5 IMPLANT
SUT SILK 2 0 SH CR/8 (SUTURE) ×2 IMPLANT
SUT STEEL 6MS V (SUTURE) ×4 IMPLANT
SUT STEEL STERNAL CCS#1 18IN (SUTURE) IMPLANT
SUT STEEL SZ 6 DBL 3X14 BALL (SUTURE) IMPLANT
SUT TEM PAC WIRE 2 0 SH (SUTURE) ×8 IMPLANT
SUT VIC AB 1 CTX 36 (SUTURE) ×10
SUT VIC AB 1 CTX36XBRD ANBCTR (SUTURE) IMPLANT
SUT VIC AB 2-0 CT1 27 (SUTURE) ×10
SUT VIC AB 2-0 CT1 TAPERPNT 27 (SUTURE) IMPLANT
SUT VIC AB 2-0 CTX 27 (SUTURE) ×4 IMPLANT
SUT VIC AB 3-0 X1 27 (SUTURE) ×6 IMPLANT
SUTURE E-PAK OPEN HEART (SUTURE) ×5 IMPLANT
SYSTEM SAHARA CHEST DRAIN ATS (WOUND CARE) ×5 IMPLANT
TAPE CLOTH SURG 4X10 WHT LF (GAUZE/BANDAGES/DRESSINGS) ×4 IMPLANT
TOWEL OR 17X24 6PK STRL BLUE (TOWEL DISPOSABLE) ×3 IMPLANT
TOWEL OR 17X26 10 PK STRL BLUE (TOWEL DISPOSABLE) ×7 IMPLANT
TRAY FOLEY IC TEMP SENS 16FR (CATHETERS) ×3 IMPLANT
TUBE SUCT INTRACARD DLP 20F (MISCELLANEOUS) ×2 IMPLANT
TUBING INSUFFLATION (TUBING) ×3 IMPLANT
UNDERPAD 30X30 INCONTINENT (UNDERPADS AND DIAPERS) ×7 IMPLANT
WATER STERILE IRR 1000ML POUR (IV SOLUTION) ×6 IMPLANT

## 2014-10-08 NOTE — Care Management (Signed)
Important Message  Patient Details  Name: Benjamin Cordova MRN: [id number] Date of Birth: 1934/05/14   Medicare Important Message Given:  Yes-third notification given    Nathen May 10/08/2014, 10:29 AM

## 2014-10-08 NOTE — Progress Notes (Signed)
OR called for pt, Pt given hibiclens bath, pt clipped, new gown and linens, consent in chart, prophylaxis sacral dressing placed.

## 2014-10-08 NOTE — Progress Notes (Signed)
Changed settings per rapid wean protocol 

## 2014-10-08 NOTE — Progress Notes (Signed)
Patient Name: Benjamin Cordova Date of Encounter: 10/08/2014  Primary cardiologist: Previously Dr. Coralie Keens   Principal Problem:   Unstable angina Active Problems:   Chest pain   Elevated INR   Leukocytosis   Thrombocytopenia   Chest pain at rest   Coagulopathy   Paroxysmal atrial fibrillation   Essential hypertension    SUBJECTIVE  No CP or SOB. Expecting surgery today.   CURRENT MEDS . aminocaproic acid (AMICAR) for OHS   Intravenous To OR  . aspirin EC  81 mg Oral Daily  . atorvastatin  80 mg Oral q1800  . bisacodyl  5 mg Oral Once  . cefUROXime (ZINACEF)  IV  1.5 g Intravenous To OR  . cefUROXime (ZINACEF)  IV  750 mg Intravenous To OR  . chlorhexidine  60 mL Topical Once  . ciprofloxacin  500 mg Oral QAC breakfast  . dexmedetomidine  0.1-0.7 mcg/kg/hr Intravenous To OR  . diazepam  2 mg Oral Once  . docusate sodium  100 mg Oral BID  . DOPamine  0-10 mcg/kg/min Intravenous To OR  . epinephrine  0-10 mcg/min Intravenous To OR  . heparin-papaverine-plasmalyte irrigation   Irrigation To OR  . heparin 30,000 units/NS 1000 mL solution for CELLSAVER   Other To OR  . insulin (NOVOLIN-R) infusion   Intravenous To OR  . magnesium sulfate  40 mEq Other To OR  . metoprolol tartrate  25 mg Oral BID  . nitroGLYCERIN  2-200 mcg/min Intravenous To OR  . phenylephrine (NEO-SYNEPHRINE) Adult infusion  30-200 mcg/min Intravenous To OR  . polyethylene glycol  17 g Oral Daily  . potassium chloride  80 mEq Other To OR  . predniSONE  10 mg Oral BID WC  . sodium chloride  3 mL Intravenous Q12H  . vancomycin  1,500 mg Intravenous To OR    OBJECTIVE  Filed Vitals:   10/07/14 0500 10/07/14 1441 10/07/14 2045 10/08/14 0500  BP: 135/79 111/72 138/89 137/73  Pulse: 93 104 117 103  Temp: 98.2 F (36.8 C) 98.4 F (36.9 C) 98.1 F (36.7 C) 98.6 F (37 C)  TempSrc: Oral Oral Oral Oral  Resp: 18  18 18   Height:      Weight: 219 lb 4.8 oz (99.474 kg)   219 lb (99.338 kg)  SpO2:  98% 95% 100% 98%    Intake/Output Summary (Last 24 hours) at 10/08/14 0751 Last data filed at 10/08/14 0042  Gross per 24 hour  Intake   1200 ml  Output   1645 ml  Net   -445 ml   Filed Weights   10/06/14 0453 10/07/14 0500 10/08/14 0500  Weight: 220 lb (99.791 kg) 219 lb 4.8 oz (99.474 kg) 219 lb (99.338 kg)    PHYSICAL EXAM  General: Pleasant, NAD. Neuro: Alert and oriented X 3. Moves all extremities spontaneously. Psych: Normal affect. HEENT:  Normal  Neck: Supple without bruits or JVD. Lungs:  Resp regular and unlabored, CTA. Heart: RRR no s3, s4, or murmurs. Abdomen: Soft, non-tender, non-distended, BS + x 4.  Extremities: No clubbing, cyanosis. DP/PT/Radials 2+ and equal bilaterally. No LEE  Accessory Clinical Findings  CBC  Recent Labs  10/07/14 0235 10/08/14 0358  WBC 8.9 10.0  HGB 12.5* 12.4*  HCT 37.7* 36.2*  MCV 90.2 90.0  PLT 124* 254*   Basic Metabolic Panel  Recent Labs  10/08/14 0358  NA 136  K 3.9  CL 101  CO2 27  GLUCOSE 115*  BUN 19  CREATININE 0.83  CALCIUM 8.9    TELE NSR with occasional PVCs    ECG  No new EKG  Echocardiogram 10/01/2014  LV EF: 50% -  55%  ------------------------------------------------------------------- Indications:   Chest pain 786.51.  ------------------------------------------------------------------- Study Conclusions  - Left ventricle: The cavity size was normal. Wall thickness was increased in a pattern of mild LVH. Systolic function was normal. The estimated ejection fraction was in the range of 50% to 55%. There is akinesis of the mid-apicalanteroseptal and apical myocardium. There is akinesis of the apicalinferior myocardium. Doppler parameters are consistent with abnormal left ventricular relaxation (grade 1 diastolic dysfunction). - Mitral valve: There was mild regurgitation. - Right atrium: Central venous pressure (est): 3 mm Hg. - Tricuspid valve: There was trivial  regurgitation. - Pulmonary arteries: PA peak pressure: 30 mm Hg (S). - Pericardium, extracardiac: There was no pericardial effusion.  Impressions:  - Mild LVH with LVEF 50-55%, wall motion abnormalties as outlined above consistent with ischemic heart disease. Grade 1 diastolic dysfunction. Mild mitral regurgitation. Trivial tricuspid regurgitation with PASP 30 mmHg.    Radiology/Studies  Dg Chest 2 View  10/07/2014   CLINICAL DATA:  Coronary artery disease. Preoperative coronary artery bypass grafting.  EXAM: CHEST  2 VIEW  COMPARISON:  October 01, 2014  FINDINGS: There is no edema or consolidation. There is a 3 mm opacity in the left apex, probably a small scar. Heart size and pulmonary vascularity are normal. No adenopathy. Patient is status post median sternotomy. There is degenerative change in the thoracic spine.  IMPRESSION: No edema or consolidation. 3 mm opacity left upper lobe, likely a small scar. Particular attention this area on subsequent examinations is advised. No change in cardiac silhouette.   Electronically Signed   By: Lowella Grip III M.D.   On: 10/07/2014 21:27   Dg Chest Port 1 View  10/01/2014   CLINICAL DATA:  Mid sternal region chest pain for 1 day  EXAM: PORTABLE CHEST - 1 VIEW  COMPARISON:  None.  FINDINGS: There is no edema or consolidation. Heart size and pulmonary vascularity are normal. Patient is status post median sternotomy. No adenopathy. No bone lesions.  IMPRESSION: No edema or consolidation.   Electronically Signed   By: Lowella Grip III M.D.   On: 10/01/2014 09:29    ASSESSMENT AND PLAN  1. Unstable angina  - echocardiogram reveals LVEF 50-55% with mid to apical anteroseptal and apical inferior akinesis  - cath 10/05/2014 severe native CAD with all vein grafts occluded include SVG to diagonal/LAD, SVG to OM1, and SVG to RCA. Patent LIMA which was NOT used previously for bypass. Consider redo CABG  - will hold off on restarting coumadin.   -  seen by Dr. Roxan Hockey, planning for redo-CABG today Fri 7/8  2. Multivessel CAD s/p CABG x 4 at [place] back in 1981  3. HTN  4. HLD  5. H/o TIA on chronic coumadin  - Patient states Dr. Karie Kirks tried to stop Coumadin at one point and patient had visual changes, so has been on longterm.  - coumadin on hold with IV heparin  - resume coumadin postop when stable from surgical perspective  6. UTI on UA 7/7 with positive nitrite  - started on Cipro yesterday   Signed, Woodward Ku Pager: [phone] For CABG; patient sent to OR prior to my evaluation [name]

## 2014-10-08 NOTE — H&P (View-Only) (Signed)
Reason for Consult:3 vessel native and graft CAD, evaluate for redo CABG Referring Physician: Dr. Martinique  Anthony Benjamin Cordova is an 80 y.o. male.  HPI: Benjamin Cordova is a 80 y.o. Man with a history of CABG who presents with a chief complaint of chest pain.  Benjamin Cordova is a 80 yo man with a history of CAD, MI, s/p CABG x 4 in 1981, hypertension, polio, and TIA.   He had not had chest pain in many years until the past 6 months. He has been having some mild chest tightness with exertion that was relieved by rest. On 7/1 he was awakened by substernal chest pressure associated with diaphoresis and nausea. He took Advil and an antacid without any improvement.The pain continued an after an hour he called EMS.   He was brought to the ED. His ECG showed occasional PVCs and his initial troponin was negative. His pain resolved with nitroglycerin. He ruled out for MI with 3 negative troponins. An echo showed an EF of 50% with anteroseptal/ apical akinesis. Yesterday he had cardiac catheterization which revealed severe 3 vessel CAD with all grafts occluded. His EF was 35%.   Past Medical History  Diagnosis Date  . Coronary artery disease     Multivessel s/p CABG 1981 at [facility]  . Essential hypertension   . Myocardial infarction 1980  . Arthritis   . Hematuria   . Bladder infection, acute   . Peripheral vascular disease   . Polio osteopathy of lower leg Age 57    Affected right leg  . TIA (transient ischemic attack)     Chronic coumadin    Past Surgical History  Procedure Laterality Date  . Tonsillectomy  age 54    [facility]  . Foot surgery  1954    [facility], muscle implantation  . Coronary artery bypass graft  1981    [facility]  . Cystoscopy with injection  10/10/2010    Procedure: CYSTOSCOPY WITH INJECTION;  Surgeon: Marissa Nestle;  Location: [facility];  Service: Urology;  Laterality: N/A;  with retrograde urethrogram  . Cardiac catheterization N/A 10/05/2014    Procedure: Left Heart Cath and  Coronary Angiography;  Surgeon: Peter M Martinique, MD;  Location: [place] [facility];  Service: Cardiovascular;  Laterality: N/A;    Family History  Problem Relation Age of Onset  . Anesthesia problems Neg Hx   . Hypotension Neg Hx   . Malignant hyperthermia Neg Hx   . Pseudochol deficiency Neg Hx     Social History:  reports that he quit smoking about 35 years ago. His smoking use included Cigarettes. He has a 28 pack-year smoking history. His smokeless tobacco use includes Snuff. He reports that he does not drink alcohol or use illicit drugs.  Allergies: No Known Allergies  Medications:  Prior to Admission:  Prescriptions prior to admission  Medication Sig Dispense Refill Last Dose  . acidophilus (RISAQUAD) CAPS capsule Take 1 capsule by mouth daily.   09/30/2014 at Unknown time  . atenolol (TENORMIN) 100 MG tablet Take 50 mg by mouth daily.     09/30/2014 at 1100  . B Complex-C-Folic Acid (SUPER B-COMPLEX/VIT C/FA) TABS Take 1 tablet by mouth daily.     09/30/2014 at Unknown time  . folic acid (FOLVITE) 174 MCG tablet Take 800 mcg by mouth daily.    09/30/2014 at Unknown time  . hydrochlorothiazide 25 MG tablet Take 12.5 mg by mouth daily.     09/30/2014 at Unknown time  .  lisinopril (PRINIVIL,ZESTRIL) 20 MG tablet Take 10 mg by mouth every morning.   09/30/2014 at Unknown time  . Multiple Vitamin (MULTIVITAMIN PO) Take 1 tablet by mouth daily.     09/30/2014 at Unknown time  . naproxen sodium (ALEVE) 220 MG tablet Take 440 mg by mouth daily as needed (for pain).    09/30/2014 at Unknown time  . predniSONE (DELTASONE) 10 MG tablet Take 30 mg by mouth every morning.   09/30/2014 at Unknown time  . simvastatin (ZOCOR) 80 MG tablet Take 40 mg by mouth at bedtime.   09/30/2014 at Unknown time  . vitamin C (ASCORBIC ACID) 500 MG tablet Take 500 mg by mouth daily.     09/30/2014 at Unknown time  . warfarin (COUMADIN) 10 MG tablet Take 10 mg by mouth daily.    09/30/2014 at 1100    Results for orders  placed or performed during the hospital encounter of 10/01/14 (from the past 48 hour(s))  CBC     Status: Abnormal   Collection Time: 10/05/14  4:18 AM  Result Value Ref Range   WBC 10.0 4.0 - 10.5 K/uL   RBC 4.48 4.22 - 5.81 MIL/uL   Hemoglobin 14.0 13.0 - 17.0 g/dL   HCT 40.7 39.0 - 52.0 %   MCV 90.8 78.0 - 100.0 fL   MCH 31.3 26.0 - 34.0 pg   MCHC 34.4 30.0 - 36.0 g/dL   RDW 13.3 11.5 - 15.5 %   Platelets 128 (L) 150 - 400 K/uL  Heparin level (unfractionated)     Status: None   Collection Time: 10/05/14  4:18 AM  Result Value Ref Range   Heparin Unfractionated 0.50 0.30 - 0.70 IU/mL    Comment:        IF HEPARIN RESULTS ARE BELOW EXPECTED VALUES, AND PATIENT DOSAGE HAS BEEN CONFIRMED, SUGGEST FOLLOW UP TESTING OF ANTITHROMBIN III LEVELS.   Basic metabolic panel     Status: Abnormal   Collection Time: 10/05/14  4:18 AM  Result Value Ref Range   Sodium 137 135 - 145 mmol/L   Potassium 4.1 3.5 - 5.1 mmol/L   Chloride 104 101 - 111 mmol/L   CO2 24 22 - 32 mmol/L   Glucose, Bld 89 65 - 99 mg/dL   BUN 26 (H) 6 - 20 mg/dL   Creatinine, Ser 0.78 0.61 - 1.24 mg/dL   Calcium 8.9 8.9 - 10.3 mg/dL   GFR calc non Af Amer >60 >60 mL/min   GFR calc Af Amer >60 >60 mL/min    Comment: (NOTE) The eGFR has been calculated using the CKD EPI equation. This calculation has not been validated in all clinical situations. eGFR's persistently <60 mL/min signify possible Chronic Kidney Disease.    Anion gap 9 5 - 15  Protime-INR     Status: None   Collection Time: 10/05/14  4:18 AM  Result Value Ref Range   Prothrombin Time 13.8 11.6 - 15.2 seconds   INR 1.04 0.00 - 1.49  POCT Activated clotting time     Status: None   Collection Time: 10/05/14  2:54 PM  Result Value Ref Range   Activated Clotting Time 116 seconds  Protime-INR     Status: None   Collection Time: 10/06/14  4:50 AM  Result Value Ref Range   Prothrombin Time 13.5 11.6 - 15.2 seconds   INR 1.01 0.00 - 1.49  CBC      Status: Abnormal   Collection Time: 10/06/14  4:50 AM  Result Value Ref Range   WBC 8.0 4.0 - 10.5 K/uL   RBC 4.11 (L) 4.22 - 5.81 MIL/uL   Hemoglobin 12.3 (L) 13.0 - 17.0 g/dL   HCT 37.1 (L) 39.0 - 52.0 %   MCV 90.3 78.0 - 100.0 fL   MCH 29.9 26.0 - 34.0 pg   MCHC 33.2 30.0 - 36.0 g/dL   RDW 13.3 11.5 - 15.5 %   Platelets 124 (L) 150 - 400 K/uL  Heparin level (unfractionated)     Status: None   Collection Time: 10/06/14  7:47 AM  Result Value Ref Range   Heparin Unfractionated 0.47 0.30 - 0.70 IU/mL    Comment:        IF HEPARIN RESULTS ARE BELOW EXPECTED VALUES, AND PATIENT DOSAGE HAS BEEN CONFIRMED, SUGGEST FOLLOW UP TESTING OF ANTITHROMBIN III LEVELS.     No results found.  Review of Systems  Constitutional: Positive for diaphoresis.  Cardiovascular: Positive for chest pain.  Gastrointestinal: Positive for nausea. Negative for abdominal pain and blood in stool.  Genitourinary: Positive for frequency. Negative for dysuria and urgency.  Musculoskeletal: Positive for joint pain.       Polio- right leg atrophic  Neurological: Positive for focal weakness (right leg).  All other systems reviewed and are negative.  Blood pressure 120/69, pulse 101, temperature 98.2 F (36.8 C), temperature source Oral, resp. rate 16, height '5\' 11"'  (1.803 m), weight 220 lb (99.791 kg), SpO2 96 %. Physical Exam  Vitals reviewed. Constitutional: He is oriented to person, place, and time. He appears well-developed and well-nourished. No distress.  HENT:  Head: Normocephalic and atraumatic.  Eyes: Conjunctivae and EOM are normal. Pupils are equal, round, and reactive to light.  Neck: Neck supple. No thyromegaly present.  Cardiovascular: Normal rate, regular rhythm and normal heart sounds.  Exam reveals no gallop and no friction rub.   No murmur heard. Normal Allen's test on left, DP/PT intact on left  Respiratory: Effort normal and breath sounds normal. He has no wheezes. He has no rales.   GI: Soft. He exhibits no distension. There is no tenderness.  Musculoskeletal: Normal range of motion. He exhibits no edema.  Well healed surgical scar left leg Atrophic right leg below knee  Lymphadenopathy:    He has no cervical adenopathy.  Neurological: He is alert and oriented to person, place, and time. No cranial nerve deficit.  Motor 5/5 except RLE  Skin: Skin is warm and dry.  Discolored right LE  Psychiatric: He has a normal mood and affect.   I personally reviewed the catheterization films and concur with the official reading   Prox LAD lesion, 80% stenosed.  Mid LAD lesion, 100% stenosed.  1st Diag-1 lesion, 90% stenosed.  1st Diag-2 lesion, 100% stenosed.  1st Mrg lesion, 100% stenosed.  2nd Mrg lesion, 90% stenosed.  Prox RCA to Mid RCA lesion, 90% stenosed.  Mid RCA to Dist RCA lesion, 100% stenosed.  Acute Mrg lesion, 90% stenosed.  SVG .  Origin lesion, before 1st Diag, 100% stenosed.  Origin lesion, 100% stenosed.  Origin lesion, 100% stenosed.  There is moderate to severe left ventricular systolic dysfunction.  1. Severe 3 vessel obstructive/occlusive CAD. 2. All grafts are occluded including SVG to diagonal/LAD, SVG to OM1, and SVG to RCA. 3. Moderate to severe LV dysfunction. 4. The LIMA is widely patent as was not used previously for bypass.  Recommendation: consider for redo CABG   Assessment/Plan: 80 yo man with a long history of  CAD, s/p CABG x 4 in 1981 using all vein grafts. He now presents with an unstable coronary syndrome. At catheterization he has severe native 3 vessel CAD and all his grafts are occluded. He needs revascularization for survival benefit and relief of symptoms.   His LAD is diffusely diseased but appears graftable as does a large OM. It is unclear if there will be any other graftable targets.  I discussed the general nature of the procedure, the need for general anesthesia, the incisions to be used, and the use  of cardiopulmonary bypass with Benjamin Cordova. We discussed the expected hospital stay, overall recovery and short and long term outcomes. I reviewed the indications, risks, benefits, and alternatives with him. He understands the risks include, but are not limited to death, stroke, MI, DVT/PE, bleeding, possible need for transfusion, infections, cardiac arrhythmias, and other organ system dysfunction including respiratory, renal, or GI complications. He understands the risk is higher for redo grafting and the results are more unpredictable. We discussed possible left radial artery usage and the risks associated with that- infection, ischemia.  He understands and accepts the risks and agrees to proceed.  Plan OR on Friday 7/8  Melrose Nakayama 10/06/2014, 5:22 PM

## 2014-10-08 NOTE — OR Nursing (Signed)
1st call to SICU 

## 2014-10-08 NOTE — Interval H&P Note (Signed)
History and Physical Interval Note:  10/08/2014 10:08 AM  Benjamin Cordova  has presented today for surgery, with the diagnosis of CAD  The various methods of treatment have been discussed with the patient and family. After consideration of risks, benefits and other options for treatment, the patient has consented to  Procedure(s): REDO CORONARY ARTERY BYPASS GRAFTING (CABG) (N/A) TRANSESOPHAGEAL ECHOCARDIOGRAM (TEE) (N/A) as a surgical intervention .  The patient's history has been reviewed, patient examined, no change in status, stable for surgery.  I have reviewed the patient's chart and labs.  Questions were answered to the patient's satisfaction.     Melrose Nakayama

## 2014-10-08 NOTE — Procedures (Signed)
Extubation Procedure Note  Patient Details:   Name: Benjamin Cordova DOB: [birth year] MRN: [id number]   Airway Documentation:  Airway 8 mm (Active)  Secured at (cm) 22 cm 10/08/2014  7:42 PM  Measured From Lips 10/08/2014  7:42 PM  Secured Location Right 10/08/2014  7:42 PM  Secured By Other (Comment) 10/08/2014  7:42 PM  Site Condition Cool;Dry 10/08/2014  7:42 PM    Evaluation  O2 sats: stable throughout Complications: No apparent complications Patient did tolerate procedure well. Bilateral Breath Sounds: Clear, Diminished Suctioning: Airway Yes pt able to vocalize name and cough to clear secretions. Pt now on 5L nasal cannula and tolerating well. NIF -25 and -28 VC 5-6L and positive for cuff leak.   Virgilio Frees 10/08/2014, 11:27 PM

## 2014-10-08 NOTE — OR Nursing (Signed)
2nd call to SICU

## 2014-10-08 NOTE — Brief Op Note (Addendum)
10/01/2014 - 10/08/2014      [address]       [place] [address]             [phone]     10/01/2014 - 10/08/2014  3:37 PM  PATIENT:  Benjamin Cordova  80 y.o. male  PRE-OPERATIVE DIAGNOSIS:  Coronary Artery Disease  POST-OPERATIVE DIAGNOSIS:  Coronary Artery Disease  PROCEDURE:  Procedure(s): REDO CORONARY ARTERY BYPASS GRAFTING (CABG)X2   LIMA to LAD  LEFT RADIAL ARTERY to OM2 TRANSESOPHAGEAL ECHOCARDIOGRAM (TEE)   SURGEON:  Surgeon(s): Melrose Nakayama, MD  PHYSICIAN ASSISTANT: WAYNE GOLD PA-C  ANESTHESIA:   general  PATIENT CONDITION:  ICU - intubated and hemodynamically stable.  PRE-OPERATIVE WEIGHT: 99.3kg  EBL/TRANSFUSION: SEE ANEST/PERFUSION RECORDS  COMPLICATIONS: NO KNOWN  LAD and OM2 were the only graftable targets. Severe adhesions. LIMA and left radial good quality.

## 2014-10-08 NOTE — Progress Notes (Signed)
Patient ID: Benjamin Cordova, male   DOB: [birth year], 80 y.o.   MRN: [id number] EVENING ROUNDS NOTE :     [address]       [place] [address]             [phone]                 Day of Surgery Procedure(s) (LRB): REDO CORONARY ARTERY BYPASS GRAFTING (CABG) Times Two Grafts using Left Internal Mammary and Left Radial Artery (N/A) TRANSESOPHAGEAL ECHOCARDIOGRAM (TEE) (N/A) Left RADIAL ARTERY HARVEST (Left)  Total Length of Stay:  LOS: 7 days  BP 108/72 mmHg  Pulse 90  Temp(Src) 96.1 F (35.6 C) (Oral)  Resp 13  Ht 5\' 11"  (1.803 m)  Wt 219 lb (99.338 kg)  BMI 30.56 kg/m2  SpO2 100%  .Intake/Output      07/07 0701 - 07/08 0700 07/08 0701 - 07/09 0700   P.O. 1200    I.V. (mL/kg)  3000 (30.2)   Blood  750   IV Piggyback  250   Total Intake(mL/kg) 1200 (12.1) 4000 (40.3)   Urine (mL/kg/hr) 1645 (0.7) 2650 (2.5)   Stool     Blood  1400 (1.3)   Total Output 1645 4050   Net -445 -50        Urine Occurrence 1 x      . sodium chloride 20 mL/hr (10/08/14 1731)  . [START ON 10/09/2014] sodium chloride    . sodium chloride 20 mL/hr (10/08/14 1700)  . dexmedetomidine 0.5 mcg/kg/hr (10/08/14 1700)  . DOPamine 3 mcg/kg/min (10/08/14 1700)  . insulin (NOVOLIN-R) infusion 5.8 Units/hr (10/08/14 1700)  . lactated ringers    . lactated ringers 20 mL/hr (10/08/14 1700)  . nitroGLYCERIN Stopped (10/08/14 1700)  . phenylephrine (NEO-SYNEPHRINE) Adult infusion 45 mcg/min (10/08/14 1700)     Lab Results  Component Value Date   WBC 23.8* 10/08/2014   HGB 10.5* 10/08/2014   HCT 30.7* 10/08/2014   PLT 104* 10/08/2014   GLUCOSE 203* 10/08/2014   CHOL 155 10/01/2014   TRIG 176* 10/01/2014   HDL 66 10/01/2014   LDLCALC 54 10/01/2014   NA 135 10/08/2014   K 4.5 10/08/2014   CL 100* 10/08/2014   CREATININE 0.70 10/08/2014   BUN 20 10/08/2014   CO2 27 10/08/2014   TSH 1.668 10/01/2014   INR 1.55* 10/08/2014   Early post op, ct drainage 200 ml since surgery  monitor plt 104,000   [name] MD  Beeper [phone] Office [phone] 10/08/2014 5:53 PM

## 2014-10-08 NOTE — Anesthesia Postprocedure Evaluation (Signed)
  Anesthesia Post-op Note  Patient: Benjamin Cordova  Procedure(s) Performed: Procedure(s): REDO CORONARY ARTERY BYPASS GRAFTING (CABG) Times Two Grafts using Left Internal Mammary and Left Radial Artery (N/A) TRANSESOPHAGEAL ECHOCARDIOGRAM (TEE) (N/A) Left RADIAL ARTERY HARVEST (Left)  Patient Location: SICU  Anesthesia Type:General  Level of Consciousness: sedated and Patient remains intubated per anesthesia plan  Airway and Oxygen Therapy: Patient remains intubated per anesthesia plan and Patient placed on Ventilator (see vital sign flow sheet for setting)  Post-op Pain: none  Post-op Assessment: Post-op Vital signs reviewed, Patient's Cardiovascular Status Stable, Respiratory Function Stable, Patent Airway, No signs of Nausea or vomiting and Pain level controlled              Post-op Vital Signs: stable  Last Vitals:  Filed Vitals:   10/08/14 0933  BP:   Pulse: 81  Temp:   Resp: 11    Complications: No apparent anesthesia complications

## 2014-10-08 NOTE — Progress Notes (Signed)
Pt arrived in short stay with all of his belongings including his gold toned ring.  Ring bagged and labeled and brought to [employer] office by Voncille Lo. Patient expecting his family and we will return to them if they arrive before surgery.

## 2014-10-08 NOTE — Transfer of Care (Signed)
Immediate Anesthesia Transfer of Care Note  Patient: Benjamin Cordova  Procedure(s) Performed: Procedure(s): REDO CORONARY ARTERY BYPASS GRAFTING (CABG) Times Two Grafts using Left Internal Mammary and Left Radial Artery (N/A) TRANSESOPHAGEAL ECHOCARDIOGRAM (TEE) (N/A) Left RADIAL ARTERY HARVEST (Left)  Patient Location: PACU and SICU  Anesthesia Type:General  Level of Consciousness: Patient remains intubated per anesthesia plan  Airway & Oxygen Therapy: Patient remains intubated per anesthesia plan and Patient placed on Ventilator (see vital sign flow sheet for setting)  Post-op Assessment: Report given to RN and Post -op Vital signs reviewed and stable  Post vital signs: Reviewed and stable  Last Vitals:  Filed Vitals:   10/08/14 1703  BP: 129/67  Pulse:   Temp:   Resp: 12    Complications: No apparent anesthesia complications

## 2014-10-08 NOTE — Progress Notes (Signed)
ANTICOAGULATION CONSULT NOTE - Follow Up Consult  Pharmacy Consult for heparin Indication: chest pain/ACS; hx TIA/afib  No Known Allergies  Patient Measurements: Height: 5\' 11"  (180.3 cm) Weight: 219 lb (99.338 kg) IBW/kg (Calculated) : 75.3 Heparin Dosing Weight: 96kg  Vital Signs: Temp: 98.6 F (37 C) (07/08 0500) Temp Source: Oral (07/08 0500) BP: 137/73 mmHg (07/08 0500) Pulse Rate: 103 (07/08 0500)  Labs:  Recent Labs  10/06/14 0450 10/06/14 0747 10/07/14 0235 10/07/14 1650 10/08/14 0358  HGB 12.3*  --  12.5*  --  12.4*  HCT 37.1*  --  37.7*  --  36.2*  PLT 124*  --  124*  --  123*  APTT  --   --   --  68*  --   LABPROT 13.5  --  13.8 13.4 13.9  INR 1.01  --  1.04 1.00 1.05  HEPARINUNFRC  --  0.47 0.40  --  0.55  CREATININE  --   --   --   --  0.83    Estimated Creatinine Clearance: 89.5 mL/min (by C-G formula based on Cr of 0.83).   Medications:  Scheduled:  . aminocaproic acid (AMICAR) for OHS   Intravenous To OR  . aspirin EC  81 mg Oral Daily  . atorvastatin  80 mg Oral q1800  . bisacodyl  5 mg Oral Once  . cefUROXime (ZINACEF)  IV  1.5 g Intravenous To OR  . cefUROXime (ZINACEF)  IV  750 mg Intravenous To OR  . chlorhexidine  60 mL Topical Once  . ciprofloxacin  500 mg Oral QAC breakfast  . dexmedetomidine  0.1-0.7 mcg/kg/hr Intravenous To OR  . diazepam  2 mg Oral Once  . docusate sodium  100 mg Oral BID  . DOPamine  0-10 mcg/kg/min Intravenous To OR  . epinephrine  0-10 mcg/min Intravenous To OR  . heparin-papaverine-plasmalyte irrigation   Irrigation To OR  . heparin 30,000 units/NS 1000 mL solution for CELLSAVER   Other To OR  . insulin (NOVOLIN-R) infusion   Intravenous To OR  . magnesium sulfate  40 mEq Other To OR  . metoprolol tartrate  25 mg Oral BID  . nitroGLYCERIN  2-200 mcg/min Intravenous To OR  . phenylephrine (NEO-SYNEPHRINE) Adult infusion  30-200 mcg/min Intravenous To OR  . polyethylene glycol  17 g Oral Daily  . potassium  chloride  80 mEq Other To OR  . predniSONE  10 mg Oral BID WC  . sodium chloride  3 mL Intravenous Q12H  . vancomycin  1,500 mg Intravenous To OR   Infusions:  . sodium chloride 1,000 mL (10/07/14 2338)  . heparin 1,150 Units/hr (10/07/14 2339)    Assessment: 79yo obese presenting with chest pain on 10/01/14 and on heparin. He is on coumadin PTA for afib/TIA and INR was noted > 8.0 on admit. Heparin level today after restart post cath is at goal (HL= 0.55). Plans noted for  redo CABG today.   Goal of Therapy:  Heparin level 0.3-0.7 units/ml Monitor platelets by anticoagulation protocol: Yes   Plan:  -No heparin changes needed -Will follow plans for coumadin post CABG  Hildred Laser, Pharm D 10/08/2014 8:16 AM

## 2014-10-08 NOTE — Progress Notes (Signed)
  Echocardiogram Echocardiogram Transesophageal has been performed.  Donata Clay 10/08/2014, 11:25 AM

## 2014-10-08 NOTE — Anesthesia Preprocedure Evaluation (Addendum)
Anesthesia Evaluation  Patient identified by MRN, date of birth, ID band Patient awake    Reviewed: Allergy & Precautions, NPO status , Patient's Chart, lab work & pertinent test results  Airway Mallampati: I  TM Distance: >3 FB Neck ROM: Full    Dental  (+) Edentulous Upper, Edentulous Lower, Dental Advisory Given   Pulmonary former smoker,    Pulmonary exam normal       Cardiovascular hypertension, + angina + CAD, + Past MI and + Peripheral Vascular Disease Rhythm:Irregular Rate:Abnormal     Neuro/Psych    GI/Hepatic   Endo/Other    Renal/GU      Musculoskeletal  (+) Arthritis -,   Abdominal   Peds  Hematology   Anesthesia Other Findings   Reproductive/Obstetrics                            Anesthesia Physical Anesthesia Plan  ASA: III  Anesthesia Plan: General   Post-op Pain Management:    Induction: Intravenous  Airway Management Planned: Oral ETT  Additional Equipment: Arterial line, CVP, PA Cath and TEE  Intra-op Plan:   Post-operative Plan: Post-operative intubation/ventilation  Informed Consent: I have reviewed the patients History and Physical, chart, labs and discussed the procedure including the risks, benefits and alternatives for the proposed anesthesia with the patient or authorized representative who has indicated his/her understanding and acceptance.     Plan Discussed with: CRNA, Anesthesiologist and Surgeon  Anesthesia Plan Comments:         Anesthesia Quick Evaluation

## 2014-10-08 NOTE — Anesthesia Procedure Notes (Signed)
Procedure Name: Intubation Date/Time: 10/08/2014 10:44 AM Performed by: Ollen Bowl Pre-anesthesia Checklist: Patient identified, Emergency Drugs available, Suction available, Patient being monitored and Timeout performed Patient Re-evaluated:Patient Re-evaluated prior to inductionOxygen Delivery Method: Circle system utilized and Simple face mask Preoxygenation: Pre-oxygenation with 100% oxygen Intubation Type: IV induction Ventilation: Mask ventilation without difficulty and Oral airway inserted - appropriate to patient size Laryngoscope Size: Miller and 3 Grade View: Grade I Tube type: Subglottic suction tube Tube size: 8.0 mm Number of attempts: 1 Airway Equipment and Method: Patient positioned with wedge pillow and Stylet Placement Confirmation: ETT inserted through vocal cords under direct vision,  positive ETCO2 and breath sounds checked- equal and bilateral Secured at: 22 cm Tube secured with: Tape Dental Injury: Teeth and Oropharynx as per pre-operative assessment

## 2014-10-09 ENCOUNTER — Inpatient Hospital Stay (HOSPITAL_COMMUNITY): Payer: Commercial Managed Care - HMO

## 2014-10-09 LAB — CBC
HCT: 24.3 % — ABNORMAL LOW (ref 39.0–52.0)
HCT: 21.9 % — ABNORMAL LOW (ref 39.0–52.0)
HEMOGLOBIN: 8.3 g/dL — AB (ref 13.0–17.0)
HEMOGLOBIN: 7.4 g/dL — AB (ref 13.0–17.0)
MCH: 30 pg (ref 26.0–34.0)
MCH: 30 pg (ref 26.0–34.0)
MCHC: 34.2 g/dL (ref 30.0–36.0)
MCHC: 33.8 g/dL (ref 30.0–36.0)
MCV: 87.7 fL (ref 78.0–100.0)
MCV: 88.7 fL (ref 78.0–100.0)
PLATELETS: 87 10*3/uL — AB (ref 150–400)
Platelets: 66 10*3/uL — ABNORMAL LOW (ref 150–400)
RBC: 2.77 MIL/uL — ABNORMAL LOW (ref 4.22–5.81)
RBC: 2.47 MIL/uL — ABNORMAL LOW (ref 4.22–5.81)
RDW: 14.4 % (ref 11.5–15.5)
RDW: 14.5 % (ref 11.5–15.5)
WBC: 13 10*3/uL — ABNORMAL HIGH (ref 4.0–10.5)
WBC: 9.8 10*3/uL (ref 4.0–10.5)

## 2014-10-09 LAB — POCT I-STAT 3, ART BLOOD GAS (G3+)
BICARBONATE: 24.7 meq/L — AB (ref 20.0–24.0)
O2 SAT: 99 %
PO2 ART: 140 mmHg — AB (ref 80.0–100.0)
TCO2: 26 mmol/L (ref 0–100)
pCO2 arterial: 42.7 mmHg (ref 35.0–45.0)
pH, Arterial: 7.374 (ref 7.350–7.450)

## 2014-10-09 LAB — GLUCOSE, CAPILLARY
GLUCOSE-CAPILLARY: 109 mg/dL — AB (ref 65–99)
GLUCOSE-CAPILLARY: 120 mg/dL — AB (ref 65–99)
GLUCOSE-CAPILLARY: 113 mg/dL — AB (ref 65–99)
GLUCOSE-CAPILLARY: 119 mg/dL — AB (ref 65–99)
GLUCOSE-CAPILLARY: 112 mg/dL — AB (ref 65–99)
GLUCOSE-CAPILLARY: 116 mg/dL — AB (ref 65–99)
GLUCOSE-CAPILLARY: 170 mg/dL — AB (ref 65–99)
GLUCOSE-CAPILLARY: 119 mg/dL — AB (ref 65–99)
GLUCOSE-CAPILLARY: 183 mg/dL — AB (ref 65–99)
Glucose-Capillary: 103 mg/dL — ABNORMAL HIGH (ref 65–99)
Glucose-Capillary: 109 mg/dL — ABNORMAL HIGH (ref 65–99)
Glucose-Capillary: 113 mg/dL — ABNORMAL HIGH (ref 65–99)
Glucose-Capillary: 113 mg/dL — ABNORMAL HIGH (ref 65–99)
Glucose-Capillary: 117 mg/dL — ABNORMAL HIGH (ref 65–99)
Glucose-Capillary: 121 mg/dL — ABNORMAL HIGH (ref 65–99)
Glucose-Capillary: 132 mg/dL — ABNORMAL HIGH (ref 65–99)
Glucose-Capillary: 151 mg/dL — ABNORMAL HIGH (ref 65–99)
Glucose-Capillary: 88 mg/dL (ref 65–99)
Glucose-Capillary: 95 mg/dL (ref 65–99)
Glucose-Capillary: 98 mg/dL (ref 65–99)

## 2014-10-09 LAB — CREATININE, SERUM
CREATININE: 1.04 mg/dL (ref 0.61–1.24)
Creatinine, Ser: 0.74 mg/dL (ref 0.61–1.24)
GFR calc Af Amer: >60 mL/min (ref >60)
GFR calc Af Amer: >60 mL/min (ref >60)
GFR calc non Af Amer: >60 mL/min (ref >60)

## 2014-10-09 LAB — BASIC METABOLIC PANEL
Anion gap: 7 (ref 5–15)
BUN: 15 mg/dL (ref 6–20)
CALCIUM: 8 mg/dL — AB (ref 8.9–10.3)
CO2: 24 mmol/L (ref 22–32)
Chloride: 104 mmol/L (ref 101–111)
Creatinine, Ser: 0.78 mg/dL (ref 0.61–1.24)
GLUCOSE: 113 mg/dL — AB (ref 65–99)
POTASSIUM: 3.9 mmol/L (ref 3.5–5.1)
Sodium: 135 mmol/L (ref 135–145)

## 2014-10-09 LAB — POCT I-STAT, CHEM 8
BUN: 20 mg/dL (ref 6–20)
Calcium, Ion: 1.18 mmol/L (ref 1.13–1.30)
Chloride: 98 mmol/L — ABNORMAL LOW (ref 101–111)
Creatinine, Ser: 1 mg/dL (ref 0.61–1.24)
GLUCOSE: 156 mg/dL — AB (ref 65–99)
HCT: 22 % — ABNORMAL LOW (ref 39.0–52.0)
HEMOGLOBIN: 7.5 g/dL — AB (ref 13.0–17.0)
POTASSIUM: 4.4 mmol/L (ref 3.5–5.1)
SODIUM: 133 mmol/L — AB (ref 135–145)
TCO2: 22 mmol/L (ref 0–100)

## 2014-10-09 LAB — MAGNESIUM
MAGNESIUM: 2.3 mg/dL (ref 1.7–2.4)
Magnesium: 2.8 mg/dL — ABNORMAL HIGH (ref 1.7–2.4)
Magnesium: 2.2 mg/dL (ref 1.7–2.4)

## 2014-10-09 LAB — PREPARE RBC (CROSSMATCH)

## 2014-10-09 MED ORDER — METOPROLOL TARTRATE 25 MG/10 ML ORAL SUSPENSION
25.0000 mg | Freq: Two times a day (BID) | ORAL | Status: DC
  Filled 2014-10-09 (×5): qty 10

## 2014-10-09 MED ORDER — INSULIN DETEMIR 100 UNIT/ML ~~LOC~~ SOLN
10.0000 [IU] | Freq: Once | SUBCUTANEOUS | Status: AC
Start: 2014-10-09 — End: 2014-10-09
  Administered 2014-10-09: 10 [IU] via SUBCUTANEOUS
  Filled 2014-10-09: qty 0.1

## 2014-10-09 MED ORDER — SODIUM CHLORIDE 0.9 % IJ SOLN: 10.0000 mL | INTRAMUSCULAR | Status: DC | PRN

## 2014-10-09 MED ORDER — FUROSEMIDE 10 MG/ML IJ SOLN
20.0000 mg | Freq: Once | INTRAMUSCULAR | Status: AC
  Administered 2014-10-09: 20 mg via INTRAVENOUS
  Filled 2014-10-09: qty 2

## 2014-10-09 MED ORDER — SODIUM CHLORIDE 0.9 % IV SOLN: Freq: Once | INTRAVENOUS | Status: DC

## 2014-10-09 MED ORDER — METOPROLOL TARTRATE 25 MG PO TABS
25.0000 mg | ORAL_TABLET | Freq: Two times a day (BID) | ORAL | Status: DC
  Administered 2014-10-09 – 2014-10-10 (×3): 25 mg via ORAL
  Filled 2014-10-09 (×5): qty 1

## 2014-10-09 MED ORDER — ISOSORBIDE MONONITRATE ER 30 MG PO TB24
30.0000 mg | ORAL_TABLET | Freq: Every day | ORAL | Status: DC
  Administered 2014-10-09 – 2014-10-14 (×6): 30 mg via ORAL
  Filled 2014-10-09 (×6): qty 1

## 2014-10-09 MED ORDER — INSULIN ASPART 100 UNIT/ML ~~LOC~~ SOLN
0.0000 [IU] | SUBCUTANEOUS | Status: DC
  Administered 2014-10-09 (×2): 2 [IU] via SUBCUTANEOUS
  Administered 2014-10-09 – 2014-10-10 (×3): 4 [IU] via SUBCUTANEOUS
  Administered 2014-10-10: 2 [IU] via SUBCUTANEOUS
  Administered 2014-10-10 (×2): 4 [IU] via SUBCUTANEOUS
  Administered 2014-10-10: 2 [IU] via SUBCUTANEOUS

## 2014-10-09 MED ORDER — SODIUM CHLORIDE 0.9 % IJ SOLN
10.0000 mL | Freq: Two times a day (BID) | INTRAMUSCULAR | Status: DC
  Administered 2014-10-10: 10 mL
  Administered 2014-10-10: 20 mL
  Administered 2014-10-11: 10 mL

## 2014-10-09 MED ORDER — NITROGLYCERIN IN D5W 200-5 MCG/ML-% IV SOLN: 0.0000 ug/min | INTRAVENOUS | Status: DC

## 2014-10-09 NOTE — Progress Notes (Signed)
Patient ID: Benjamin Cordova, male   DOB: 04/04/1934, 80 y.o.   MRN: [id number] EVENING ROUNDS NOTE :     [address]       [place] [address]             [phone]                 1 Day Post-Op Procedure(s) (LRB): REDO CORONARY ARTERY BYPASS GRAFTING (CABG) Times Two Grafts using Left Internal Mammary and Left Radial Artery (N/A) TRANSESOPHAGEAL ECHOCARDIOGRAM (TEE) (N/A) Left RADIAL ARTERY HARVEST (Left)  Total Length of Stay:  LOS: 8 days  BP 132/55 mmHg  Pulse 126  Temp(Src) 98.1 F (36.7 C) (Oral)  Resp 16  Ht 5\' 11"  (1.803 m)  Wt 231 lb 7.7 oz (105 kg)  BMI 32.30 kg/m2  SpO2 96%  .Intake/Output      07/08 0701 - 07/09 0700 07/09 0701 - 07/10 0700   P.O.  840   I.V. (mL/kg) 4585.5 (43.7) 404.4 (3.9)   Blood 750    NG/GT 60    IV Piggyback 1630 50   Total Intake(mL/kg) 7025.5 (66.9) 1294.4 (12.3)   Urine (mL/kg/hr) 5320 (2.1) 495 (0.4)   Blood 1400 (0.6)    Chest Tube 680 (0.3) 420 (0.3)   Total Output 7400 915   Net -374.5 +379.4          . sodium chloride 20 mL/hr at 10/09/14 0800  . sodium chloride 250 mL (10/09/14 0800)  . sodium chloride 20 mL/hr at 10/09/14 0800  . dexmedetomidine Stopped (10/08/14 2045)  . DOPamine Stopped (10/09/14 0600)  . lactated ringers    . lactated ringers 20 mL/hr at 10/09/14 0800  . nitroGLYCERIN Stopped (10/09/14 0815)  . phenylephrine (NEO-SYNEPHRINE) Adult infusion Stopped (10/09/14 0600)     Lab Results  Component Value Date   WBC 9.8 10/09/2014   HGB 7.5* 10/09/2014   HCT 22.0* 10/09/2014   PLT 66* 10/09/2014   GLUCOSE 156* 10/09/2014   CHOL 155 10/01/2014   TRIG 176* 10/01/2014   HDL 66 10/01/2014   LDLCALC 54 10/01/2014   NA 133* 10/09/2014   K 4.4 10/09/2014   CL 98* 10/09/2014   CREATININE 1.00 10/09/2014   BUN 20 10/09/2014   CO2 24 10/09/2014   TSH 1.668 10/01/2014   INR 1.55* 10/08/2014   Recent Results (from the past 720 hour(s))  Surgical pcr screen     Status: None   Collection Time: 10/07/14  4:38 PM  Result Value Ref Range Status   MRSA, PCR NEGATIVE NEGATIVE Final   Staphylococcus aureus NEGATIVE NEGATIVE Final    Comment:        The Xpert SA Assay (FDA approved for NASAL specimens in patients over 52 years of age), is one component of a comprehensive surveillance program.  Test performance has been validated by [facility] for patients greater than or equal to 26 year old. It is not intended to diagnose infection nor to guide or monitor treatment.       Weak standing Sinus but with increased hr HGB down to 7.5, 8.3 this am Will need PT due to polio an previous knee injury On cipro preop for UTI , has had 2 or three previous UTI since January, UA 7/7 indicated UTI Will transfuse one unit prbc No urine culture found in system, Patient says primary care md cultured   [name] MD  [place] Office [phone] 10/09/2014 6:31 PM

## 2014-10-09 NOTE — Plan of Care (Signed)
Problem: Phase III - Recovery through Discharge Goal: Activity Progressed Outcome: Not Progressing Pt with history of polio and recent knee injury unable to ambulate and requires 2 person strong assist to get OOB. MD aware, P.T. Consult ordered.

## 2014-10-09 NOTE — Op Note (Signed)
NAME:  OVERTON, BOGGUS NO.:  [id number]  MEDICAL RECORD NO.:  [id number]  LOCATION:  2S02C                        FACILITY:  [facility]  PHYSICIAN:  Revonda Standard. Roxan Hockey, M.D.DATE OF BIRTH:  09-26-1934  DATE OF PROCEDURE:  10/08/2014 DATE OF DISCHARGE:                              OPERATIVE REPORT   PREOPERATIVE DIAGNOSIS:  Severe native and graft three-vessel coronary artery disease with unstable angina.  POSTOPERATIVE DIAGNOSIS:  Severe native and graft three-vessel coronary artery disease with unstable angina.  PROCEDURE:  Redo median sternotomy Redo coronary bypass grafting x2  Left internal mammary artery to the left anterior descending artery,  Left radial artery to obtuse marginal 2  SURGEONS:  Remo Lipps C. Roxan Hockey, M.D.  ASSISTANT:  John Giovanni, PA-C  ANESTHESIA:  General.  FINDINGS:  Severe adhesions.  LAD and OM-2 were the only graftable vessels.  OM-1, diagonal, and PDA not graftable.  Left mammary and left radial good quality.  Transesophageal echocardiography revealed an anteroapical akinesis.  CLINICAL NOTE:  Mr. Tagliaferro is a 80 year old man who had undergone coronary bypass grafting in 1981.  He presented with an unstable coronary syndrome and at catheterization had severe native 3-vessel disease and all his grafts were occluded.  He was advised to undergo a redo bypass grafting.  The indications, risks, benefits, and alternatives were discussed in detail with the patient.  The limitations of redo grafting were discussed with the patient, particularly in light of his limited number of target vessels on catheterization.  He understood and accepted the risks and agreed to proceed.  On his preoperative vascular study, his palmar arch decreased to 50% with radial compression. However, clinically, he had a normal Allen test and when done with pulse oximetry, there was only a mild decrease in the waveform.  Decision was made to explore the left  radial and check the palmar arch with the radial artery occluded intraoperatively.  DESCRIPTION OF PROCEDURE:  Mr. Strohmeier was brought to the preoperative holding area on October 08, 2014.  Anesthesia placed a Swan-Ganz catheter and an arterial blood pressure monitoring line.  He was taken to the operating room, anesthetized, and intubated.  Intravenous antibiotics were administered.  A Foley catheter was placed.  The chest, abdomen, legs, and left arm were prepped and draped in the usual sterile fashion. Transesophageal echocardiography was performed by Dr. Kate Sable. There was no significant valvular pathology.  There was apical akinesis.  An incision was made over the volar aspect of the left wrist.  Only a small distal incision was made initially.  The fascia overlying the radial artery was incised.  A short segment of the radial artery was mobilized with the Harmonic scalpel.  With proximal occlusion, there was no change in the Doppler signal in the palmar arch and there was a good pulse in the radial artery distal to the clamp. The decision made to harvest the radial artery for use as a bypass graft.  The incision was extended up to just below the antecubital fossa and the radial was harvested using standard technique.  Simultaneously with this, a redo median sternotomy was performed.  The skin and subcutaneous tissue were incised.  The wires  were removed.  The sternum was divided with an oscillating saw. The pericardium had not been closed previously.  A Rultract retractor was placed and the left internal mammary artery was harvested using standard technique.  5000 units of heparin was administered during the vessel harvest.  Both the mammary and radial were good quality vessels.  After harvesting the conduits, the retractor was placed and gently opened over a period of time.  It was noted previously the pericardium had not been closed. Careful dissection was initiated.  The  innominate vein was identified. There was minimal tissue overlying the right ventricle.  A plane was developed between the right ventricle and overlying mediastinal fatty tissue and dissected on the right side to the level of the right atrium. The pericardium then was identified and stay sutures were placed on the left side.  The dissection was taken over to the old LAD vein graft and the pericardium was identified in that vicinity.  Stay sutures were placed on that side as well.  The diaphragmatic surface of the heart was dissected out and then the dissection was continued along the right atrium.  The right atrium was relatively thin walled and difficult to access and the decision was made to use femoral venous cannulation.  The remainder of full heparin dose was given. After confirming adequate anticoagulation with ACT measurement, the aorta was cannulated via concentric 2-0 Ethibond pledgeted pursestring sutures.  The right femoral vein was accessed using the Seldinger technique.  A wire was advanced into the heart. Its position within the right atrium was confirmed with fluoroscopy.  The tract was sequentially dilated and then a 23/25- [place] venous cannula was advanced into the right atrium.  This was connected to the bypass circuit and cardiopulmonary bypass was initiated and the patient was cooled to 32 degrees Celsius.  The remainder of the pericardial dissection was carried out.  When dissecting on the ascending aorta, there was a mass of tissue associated with one of the old vein grafts and when dissecting around this area, bleeding was encountered.  A 4-0 Prolene pledgeted suture was placed which controlled the bleeding, but did not completely stop it.  The old vein grafts were divided as they were all completely occluded.  The coronary arteries were inspected.  The only graftable vessels were the LAD which was a good quality vessel at the site of the anastomosis, but was a  poor quality vessel on catheterization and OM-2 which was a good quality vessel.  A retrograde cardioplegia cannula was placed via a pursestring suture in the right atrium and directed into the coronary sinus.  An antegrade cardioplegia cannula was placed in the ascending aorta.  The aorta was crossclamped.  The left ventricle was emptied via the aortic root vent.  Cardiac arrest was achieved with a combination of cold antegrade and retrograde blood cardioplegia and topical iced saline.  An initial 500 mL of cardioplegia was administered antegrade.  There was a rapid diastolic arrest.  An additional 500 mL of cardioplegia was administered retrograde.  There was septal cooling to 11 degrees Celsius.    The heart was elevated.  OM-2 was exposed and an arteriotomy was made.  This was a 1.5 mm good quality target.  The radial was a 2 mm good quality conduit.  An end-to-side anastomosis was performed with a running 8-0 Prolene suture.  At the completion of the anastomosis, a probe passed easily proximally and distally.  With cardioplegia administration, there was good backbleeding through  the radial artery.  The LAD was exposed.  The vessel was heavily calcified until it was very near the apex, it was grafted just beyond this calcification.  It was a 1.5 mm vessel at the site of the anastomosis, and the site of the anastomosis was free of atherosclerotic disease.  The left internal mammary artery was beveled and anastomosed end-to-side to the LAD with a running 8-0 Prolene suture.  At the completion of anastomosis, the bulldog clamp was removed to inspect for hemostasis.  Rapid septal rewarming was noted.  The bulldog clamp was replaced.  The mammary pedicle was tacked to the epicardial surface of the heart with 6-0 Prolene sutures.  Additional cardioplegia was administered.  The cardioplegia cannula was removed from the ascending aorta.  The site at the old vein graft proximal was  inspected and there was a tear in this area.  The pledgeted suture was taken down and the old vein graft was taken down.  This left a 4 x 5 mm defect in the ascending aorta.  The proximal radial anastomosis was performed to a 4.5 mm punch aortotomy with a running 7-0 Prolene suture. The hole in the ascending aorta then was repaired with a Hemashield patch. This was done with a running 6-0 Prolene suture.  At the completion of the aortic patch, the patient was placed in Trendelenburg position.  A warm dose of cardioplegia was administered retrograde to assist with de- airing.  The bulldog clampe then were removed from the left mammary artery and the left radial artery.  After de-airing the heart, the aortic crossclamp was removed.  The total crossclamp time was 70 minutes.  The patient initially fibrillated, but then converted to sinus rhythm spontaneously.  While rewarming was completed, all proximal and distal anastomoses and the patch were inspected for hemostasis.  Epicardial pacing wires were placed on the right ventricle and right atrium and a dopamine infusion was initiated at 3 mcg/kg/minute.  When the patient had rewarmed to a core temperature of 37 degrees Celsius, he was weaned from cardiopulmonary bypass on the first attempt without difficulty.  The total bypass time was 128 minutes.  The initial cardiac index was 1.6, but the patient improved hemodynamically with volume administration.  He was hemodynamically stable throughout the remainder of the postbypass. Postbypass transesophageal echocardiography revealed no significant change from the prebypass study.  A test dose of protamine was administered and was well tolerated.  The right femoral venous cannula was removed and pressure was held for 20 minutes.  The aortic cannula was removed.  The remainder of the protamine was administered without incident.  The chest was irrigated with warm saline.  Hemostasis was achieved.  A  left pleural, mediastinal chest tube, and mediastinal Blake drain were placed through separate subcostal incisions and secured with #1 silk sutures.  The sternum was closed with a combination of single and double heavy gauge stainless steel wires.  The pectoralis fascia, subcutaneous tissue, and skin were closed in standard fashion.  All sponge, needle, and instrument counts were correct at the end of the procedure.  The patient was taken from the operating room to the surgical intensive care unit in good condition.     Revonda Standard Roxan Hockey, M.D.     SCH/MEDQ  D:  10/08/2014  T:  10/09/2014  Job:  [id number]

## 2014-10-09 NOTE — Plan of Care (Signed)
Problem: Phase II - Intermediate Post-Op Goal: Activity Progressed Outcome: Progressing Patient unsteady on feet d/t polio. Unable to ambulate, difficult to pivot to chair. Will need PT consult for mobility progression.

## 2014-10-09 NOTE — Progress Notes (Signed)
Takes 2 assists to stand patient, cannot walk, can hardly pivot to chair.

## 2014-10-09 NOTE — Plan of Care (Signed)
Problem: Phase II - Intermediate Post-Op Goal: Wean to Extubate Outcome: Completed/Met Date Met:  10/09/14 7/8 Goal: Maintain Hemodynamic Stability Outcome: Progressing Will continue to wean neo gtt Goal: Pain controlled with appropriate interventions Outcome: Progressing Glucose maintained via insulin gtt Goal: Advance Diet Outcome: Progressing Diet advanced to clear liquids

## 2014-10-09 NOTE — Progress Notes (Signed)
Patient ID: Benjamin Cordova, male   DOB: 06/13/1934, 80 y.o.   MRN: [id number] TCTS DAILY ICU PROGRESS NOTE                   [address]            [place] [address]          [id number]   1 Day Post-Op Procedure(s) (LRB): REDO CORONARY ARTERY BYPASS GRAFTING (CABG) Times Two Grafts using Left Internal Mammary and Left Radial Artery (N/A) TRANSESOPHAGEAL ECHOCARDIOGRAM (TEE) (N/A) Left RADIAL ARTERY HARVEST (Left)  Total Length of Stay:  LOS: 8 days   Subjective: Awake and alert , neuro intact extubated last night  Objective: Vital signs in last 24 hours: Temp:  [95.5 F (35.3 C)-100.2 F (37.9 C)] 99.1 F (37.3 C) (07/09 0800) Pulse Rate:  [79-123] 117 (07/09 0800) Cardiac Rhythm:  [-] Sinus tachycardia (07/09 0744) Resp:  [4-67] 30 (07/09 0800) BP: (80-129)/(45-83) 90/61 mmHg (07/09 0700) SpO2:  [95 %-100 %] 98 % (07/09 0800) Arterial Line BP: (81-175)/(28-73) 108/28 mmHg (07/09 0800) FiO2 (%):  [40 %-50 %] 40 % (07/08 2210) Weight:  [231 lb 7.7 oz (105 kg)] 231 lb 7.7 oz (105 kg) (07/09 0615)  Filed Weights   10/07/14 0500 10/08/14 0500 10/09/14 0615  Weight: 219 lb 4.8 oz (99.474 kg) 219 lb (99.338 kg) 231 lb 7.7 oz (105 kg)    Weight change: 12 lb 7.7 oz (5.662 kg)   Hemodynamic parameters for last 24 hours: PAP: (16-46)/(6-21) 19/10 mmHg CO:  [3.4 L/min-5.9 L/min] 5.9 L/min CI:  [1.6 L/min/m2-2.7 L/min/m2] 2.7 L/min/m2  Intake/Output from previous day: 07/08 0701 - 07/09 0700 In: 7025.5 [I.V.:4585.5; Blood:750; NG/GT:60; IV Piggyback:1630] Out: 7400 [Urine:5320; Blood:1400; Chest Tube:680]  Intake/Output this shift:    Current Meds: Scheduled Meds: . acetaminophen  1,000 mg Oral 4 times per day   Or  . acetaminophen (TYLENOL) oral liquid 160 mg/5 mL  1,000 mg Per Tube 4 times per day  . albumin human  12.5 g Intravenous Once  . antiseptic oral rinse  7 mL Mouth Rinse QID  . aspirin EC  325 mg Oral Daily   Or  . aspirin  324 mg Per  Tube Daily  . atorvastatin  80 mg Oral q1800  . bisacodyl  10 mg Oral Daily   Or  . bisacodyl  10 mg Rectal Daily  . cefUROXime (ZINACEF)  IV  1.5 g Intravenous Q12H  . chlorhexidine  15 mL Mouth Rinse BID  . ciprofloxacin  500 mg Oral QAC breakfast  . docusate sodium  200 mg Oral Daily  . famotidine (PEPCID) IV  20 mg Intravenous Q12H  . insulin regular  0-10 Units Intravenous TID WC  . metoprolol tartrate  12.5 mg Oral BID   Or  . metoprolol tartrate  12.5 mg Per Tube BID  . [START ON 10/10/2014] pantoprazole  40 mg Oral Daily  . predniSONE  10 mg Oral BID WC  . sodium chloride  3 mL Intravenous Q12H   Continuous Infusions: . sodium chloride 20 mL/hr at 10/09/14 0700  . sodium chloride 250 mL (10/09/14 0700)  . sodium chloride 20 mL/hr at 10/09/14 0700  . dexmedetomidine Stopped (10/08/14 2045)  . DOPamine Stopped (10/09/14 0600)  . insulin (NOVOLIN-R) infusion 2.7 Units/hr (10/09/14 0713)  . lactated ringers    . lactated ringers 20 mL/hr at 10/09/14 0700  . nitroGLYCERIN 7 mcg/min (10/09/14 0700)  . phenylephrine (NEO-SYNEPHRINE)  Adult infusion Stopped (10/09/14 0600)   PRN Meds:.sodium chloride, metoprolol, morphine injection, ondansetron (ZOFRAN) IV, oxyCODONE, sodium chloride, traMADol  General appearance: alert, cooperative and no distress Neurologic: intact Heart: regular rate and rhythm, S1, S2 normal, no murmur, click, rub or gallop Lungs: diminished breath sounds bibasilar Abdomen: soft, non-tender; bowel sounds normal; no masses,  no organomegaly Extremities: extremities normal, atraumatic, no cyanosis or edema and Homans sign is negative, no sign of DVT Wound: sternum intact  Lab Results: CBC:  Recent Labs  10/08/14 2300 10/09/14 0412  WBC 16.8* 13.0*  HGB 8.9* 8.3*  HCT 25.4* 24.3*  PLT 105* 87*   BMET:  Recent Labs  10/08/14 0358  10/08/14 2252 10/08/14 2300 10/09/14 0412  NA 136  < > 137  --  135  K 3.9  < > 4.0  --  3.9  CL 101  < > 101   --  104  CO2 27  --   --   --  24  GLUCOSE 115*  < > 118*  --  113*  BUN 19  < > 15  --  15  CREATININE 0.83  < > 0.70 0.74 0.78  CALCIUM 8.9  --   --   --  8.0*  < > = values in this interval not displayed.  PT/INR:   Recent Labs  10/08/14 1705  LABPROT 18.7*  INR 1.55*   Radiology: Dg Chest Port 1 View  10/08/2014   CLINICAL DATA:  80 year old male -status post CABG. Shortness of breath.  EXAM: PORTABLE CHEST - 1 VIEW  COMPARISON:  10/07/2014 and 10/01/2014 radiographs  FINDINGS: Median sternotomy changes identified.  Endotracheal tube is identified with tip 4 cm above carina.  A right IJ Swan-Ganz catheter is noted with tip overlying the distal right main pulmonary artery.  Mediastinal and left thoracostomy tubes are present.  An NG tube is identified with tip off the field of view.  This is a low volume film.  Bibasilar atelectasis is noted.  There is no evidence of pneumothorax.  IMPRESSION: Status post CABG was support apparatus as described.  Bibasilar atelectasis.  No evidence of pneumothorax.   Electronically Signed   By: Margarette Canada M.D.   On: 10/08/2014 17:23     Assessment/Plan: S/P Procedure(s) (LRB): REDO CORONARY ARTERY BYPASS GRAFTING (CABG) Times Two Grafts using Left Internal Mammary and Left Radial Artery (N/A) TRANSESOPHAGEAL ECHOCARDIOGRAM (TEE) (N/A) Left RADIAL ARTERY HARVEST (Left) Mobilize Diuresis Diabetes control d/c tubes/lines See progression orders  Expected Acute  Blood - loss Anemia Renal function stable 400 from chest tubes last shift , leave in Start imdur and d/c ntg drip for radial graft Thrombocytopenia- avoid heparin for now    [name] 10/09/2014 8:01 AM

## 2014-10-10 ENCOUNTER — Inpatient Hospital Stay (HOSPITAL_COMMUNITY): Payer: Commercial Managed Care - HMO

## 2014-10-10 LAB — TYPE AND SCREEN
ABO/RH(D): O POS
Antibody Screen: NEGATIVE
Unit division: 0
Unit division: 0
Unit division: 0

## 2014-10-10 LAB — GLUCOSE, CAPILLARY
GLUCOSE-CAPILLARY: 141 mg/dL — AB (ref 65–99)
GLUCOSE-CAPILLARY: 169 mg/dL — AB (ref 65–99)
Glucose-Capillary: 148 mg/dL — ABNORMAL HIGH (ref 65–99)
Glucose-Capillary: 154 mg/dL — ABNORMAL HIGH (ref 65–99)
Glucose-Capillary: 167 mg/dL — ABNORMAL HIGH (ref 65–99)
Glucose-Capillary: 180 mg/dL — ABNORMAL HIGH (ref 65–99)
Glucose-Capillary: 195 mg/dL — ABNORMAL HIGH (ref 65–99)

## 2014-10-10 LAB — CBC
HCT: 23.8 % — ABNORMAL LOW (ref 39.0–52.0)
Hemoglobin: 8.1 g/dL — ABNORMAL LOW (ref 13.0–17.0)
MCH: 30.3 pg (ref 26.0–34.0)
MCHC: 34 g/dL (ref 30.0–36.0)
MCV: 89.1 fL (ref 78.0–100.0)
Platelets: 59 10*3/uL — ABNORMAL LOW (ref 150–400)
RBC: 2.67 MIL/uL — ABNORMAL LOW (ref 4.22–5.81)
RDW: 15 % (ref 11.5–15.5)
WBC: 10 10*3/uL (ref 4.0–10.5)

## 2014-10-10 LAB — BASIC METABOLIC PANEL
Anion gap: 7 (ref 5–15)
BUN: 16 mg/dL (ref 6–20)
CO2: 27 mmol/L (ref 22–32)
Calcium: 8 mg/dL — ABNORMAL LOW (ref 8.9–10.3)
Chloride: 97 mmol/L — ABNORMAL LOW (ref 101–111)
Creatinine, Ser: 0.93 mg/dL (ref 0.61–1.24)
GFR calc Af Amer: >60 mL/min (ref >60)
GFR calc non Af Amer: >60 mL/min (ref >60)
Glucose, Bld: 160 mg/dL — ABNORMAL HIGH (ref 65–99)
Potassium: 4 mmol/L (ref 3.5–5.1)
Sodium: 131 mmol/L — ABNORMAL LOW (ref 135–145)

## 2014-10-10 MED ORDER — FUROSEMIDE 10 MG/ML IJ SOLN
40.0000 mg | Freq: Once | INTRAMUSCULAR | Status: AC
  Administered 2014-10-10: 40 mg via INTRAVENOUS

## 2014-10-10 MED ORDER — CIPROFLOXACIN HCL 500 MG PO TABS
500.0000 mg | ORAL_TABLET | Freq: Every day | ORAL | Status: DC
  Administered 2014-10-10 – 2014-10-12 (×3): 500 mg via ORAL
  Filled 2014-10-10 (×4): qty 1

## 2014-10-10 MED ORDER — WARFARIN SODIUM 2.5 MG PO TABS
2.5000 mg | ORAL_TABLET | Freq: Once | ORAL | Status: AC
  Administered 2014-10-10: 2.5 mg via ORAL
  Filled 2014-10-10: qty 1

## 2014-10-10 MED ORDER — WARFARIN - PHYSICIAN DOSING INPATIENT: Freq: Every day | Status: DC

## 2014-10-10 NOTE — Progress Notes (Signed)
Patient ID: Benjamin Cordova, male   DOB: July 26, 1934, 80 y.o.   MRN: [id number] EVENING ROUNDS NOTE :     [address]       [place] [address]             [phone]                 2 Days Post-Op Procedure(s) (LRB): REDO CORONARY ARTERY BYPASS GRAFTING (CABG) Times Two Grafts using Left Internal Mammary and Left Radial Artery (N/A) TRANSESOPHAGEAL ECHOCARDIOGRAM (TEE) (N/A) Left RADIAL ARTERY HARVEST (Left)  Total Length of Stay:  LOS: 9 days  BP 125/64 mmHg  Pulse 118  Temp(Src) 99.1 F (37.3 C) (Oral)  Resp 24  Ht 5\' 11"  (1.803 m)  Wt 233 lb 0.4 oz (105.7 kg)  BMI 32.51 kg/m2  SpO2 93%  .Intake/Output      07/09 0701 - 07/10 0700 07/10 0701 - 07/11 0700   P.O. 1560 960   I.V. (mL/kg) 634.4 (6)    Blood 350    NG/GT     IV Piggyback 100 50   Total Intake(mL/kg) 2644.4 (25) 1010 (9.6)   Urine (mL/kg/hr) 2000 (0.8) 1880 (1.5)   Blood     Chest Tube 520 (0.2)    Total Output 2520 1880   Net +124.4 -870          . sodium chloride 250 mL (10/09/14 0800)  . sodium chloride 20 mL/hr at 10/09/14 0800  . lactated ringers 20 mL/hr at 10/09/14 0800     Lab Results  Component Value Date   WBC 10.0 10/10/2014   HGB 8.1* 10/10/2014   HCT 23.8* 10/10/2014   PLT 59* 10/10/2014   GLUCOSE 160* 10/10/2014   CHOL 155 10/01/2014   TRIG 176* 10/01/2014   HDL 66 10/01/2014   LDLCALC 54 10/01/2014   NA 131* 10/10/2014   K 4.0 10/10/2014   CL 97* 10/10/2014   CREATININE 0.93 10/10/2014   BUN 16 10/10/2014   CO2 27 10/10/2014   TSH 1.668 10/01/2014   INR 1.55* 10/08/2014   Still low plts Start coumadin back Still sinus tachycardia - a wire tracing not flutter on lopressor 25 mg bid  [name] MD  Beeper [phone] Office [phone] 10/10/2014 6:37 PM

## 2014-10-10 NOTE — Progress Notes (Signed)
Pt called staff c/o blurry vision and that was a consistent symptom prior to previous TIA. Pt states he frequently has blurred vision and takes ECASA to relieve it. A thorough neuro examine was performed with PEARRL, intact optical field, no facial droop or asymmetry or slurred speech. Grips were equal bilaterally with no drift of upper and lower extremities. Re-assurance provided to Pt who then requested the regularly scheduled ECASA early. Notify sent to pharmacy, will continue to monitor and assess.

## 2014-10-10 NOTE — Progress Notes (Signed)
Dr. Servando Snare in, aware of episodes of blurred vision and pt requests for an aspirin.

## 2014-10-10 NOTE — Progress Notes (Signed)
States vision is fine at this time.

## 2014-10-10 NOTE — Progress Notes (Signed)
PT Cancellation Note  Patient Details Name: LAVANCE BEAZER MRN: [id number] DOB: 1934-06-16   Cancelled Treatment:    Reason Eval/Treat Not Completed: Fatigue/lethargy limiting ability to participate. Nursing staff just getting pt back to bed when PT arrived for evaluation. RN asks that therapy return at a later time to attempt eval. Will check back as schedule allows.   Rolinda Roan 10/10/2014, 10:24 AM  Rolinda Roan, PT, DPT Acute Rehabilitation Services Pager: [phone]

## 2014-10-10 NOTE — Progress Notes (Signed)
C/O blurry vision and insists he needs an aspirin. Told patient that he got his aspirin earlier. MOE x 4, pupils equal and reactive. Stated he wanted something like ibuprofen, etc. Gave patient 100 mg ultram per request. Will continue to monitor and make MD aware on evening rounds.

## 2014-10-10 NOTE — Progress Notes (Signed)
Patient ID: JAYON MATTON, male   DOB: 04/17/1934, 80 y.o.   MRN: [id number] TCTS DAILY ICU PROGRESS NOTE                   [address]            [place] [address]          [phone]   2 Days Post-Op Procedure(s) (LRB): REDO CORONARY ARTERY BYPASS GRAFTING (CABG) Times Two Grafts using Left Internal Mammary and Left Radial Artery (N/A) TRANSESOPHAGEAL ECHOCARDIOGRAM (TEE) (N/A) Left RADIAL ARTERY HARVEST (Left)  Total Length of Stay:  LOS: 9 days   Subjective: Feels better this am, had trouble standing yesterday, PT request placed yesterday  Objective: Vital signs in last 24 hours: Temp:  [98.1 F (36.7 C)-99.5 F (37.5 C)] 98.6 F (37 C) (07/10 0708) Pulse Rate:  [101-135] 101 (07/10 0600) Cardiac Rhythm:  [-] Sinus tachycardia (07/10 0709) Resp:  [0-43] 22 (07/10 0600) BP: (95-145)/(54-75) 118/57 mmHg (07/10 0600) SpO2:  [92 %-100 %] 97 % (07/10 0600) Arterial Line BP: (103-127)/(24-33) 119/29 mmHg (07/09 1100) Weight:  [233 lb 0.4 oz (105.7 kg)] 233 lb 0.4 oz (105.7 kg) (07/10 0500)  Filed Weights   10/08/14 0500 10/09/14 0615 10/10/14 0500  Weight: 219 lb (99.338 kg) 231 lb 7.7 oz (105 kg) 233 lb 0.4 oz (105.7 kg)    Weight change: 1 lb 8.7 oz (0.7 kg)   Hemodynamic parameters for last 24 hours: PAP: (14-21)/(7-11) 21/11 mmHg  Intake/Output from previous day: 07/09 0701 - 07/10 0700 In: 2644.4 [P.O.:1560; I.V.:634.4; Blood:350; IV Piggyback:100] Out: 2520 [Urine:2000; Chest Tube:520]  Intake/Output this shift: Total I/O In: 50 [IV Piggyback:50] Out: -   Current Meds: Scheduled Meds: . sodium chloride   Intravenous Once  . acetaminophen  1,000 mg Oral 4 times per day   Or  . acetaminophen (TYLENOL) oral liquid 160 mg/5 mL  1,000 mg Per Tube 4 times per day  . albumin human  12.5 g Intravenous Once  . aspirin EC  325 mg Oral Daily   Or  . aspirin  324 mg Per Tube Daily  . atorvastatin  80 mg Oral q1800  . bisacodyl  10 mg Oral  Daily   Or  . bisacodyl  10 mg Rectal Daily  . ciprofloxacin  500 mg Oral QAC breakfast  . docusate sodium  200 mg Oral Daily  . insulin aspart  0-24 Units Subcutaneous 6 times per day  . isosorbide mononitrate  30 mg Oral Daily  . metoprolol tartrate  25 mg Oral BID   Or  . metoprolol tartrate  25 mg Per Tube BID  . pantoprazole  40 mg Oral Daily  . predniSONE  10 mg Oral BID WC  . sodium chloride  10-40 mL Intracatheter Q12H  . sodium chloride  3 mL Intravenous Q12H   Continuous Infusions: . sodium chloride 250 mL (10/09/14 0800)  . sodium chloride 20 mL/hr at 10/09/14 0800  . lactated ringers 20 mL/hr at 10/09/14 0800   PRN Meds:.metoprolol, morphine injection, ondansetron (ZOFRAN) IV, oxyCODONE, sodium chloride, sodium chloride, traMADol  General appearance: alert, cooperative and no distress Neurologic: intact Heart: regular rate and rhythm, S1, S2 normal, no murmur, click, rub or gallop Lungs: diminished breath sounds bibasilar Abdomen: soft, non-tender; bowel sounds normal; no masses,  no organomegaly Extremities: extremities normal, atraumatic, no cyanosis or edema and Homans sign is negative, no sign of DVT Wound: sternum stable  Lab Results:  CBC: Recent Labs  10/09/14 1520 10/09/14 1526 10/10/14 0400  WBC 9.8  --  10.0  HGB 7.4* 7.5* 8.1*  HCT 21.9* 22.0* 23.8*  PLT 66*  --  59*   BMET:  Recent Labs  10/09/14 0412  10/09/14 1526 10/10/14 0400  NA 135  --  133* 131*  K 3.9  --  4.4 4.0  CL 104  --  98* 97*  CO2 24  --   --  27  GLUCOSE 113*  --  156* 160*  BUN 15  --  20 16  CREATININE 0.78  < > 1.00 0.93  CALCIUM 8.0*  --   --  8.0*  < > = values in this interval not displayed.  PT/INR:  Recent Labs  10/08/14 1705  LABPROT 18.7*  INR 1.55*   Radiology: Dg Chest Port 1 View  10/10/2014   CLINICAL DATA:  Postop from CABG.  EXAM: PORTABLE CHEST - 1 VIEW  COMPARISON:  10/09/2014  FINDINGS: The Swan-Ganz catheter has been removed. Mediastinal  drains and left chest tube remain in place. No pneumothorax visualized.  Low lung volumes and bibasilar atelectasis show no significant change. Heart size is stable.  IMPRESSION: Bibasilar atelectasis, without significant change. No pneumothorax visualized.   Electronically Signed   By: Earle Gell M.D.   On: 10/10/2014 07:52     Assessment/Plan: S/P Procedure(s) (LRB): REDO CORONARY ARTERY BYPASS GRAFTING (CABG) Times Two Grafts using Left Internal Mammary and Left Radial Artery (N/A) TRANSESOPHAGEAL ECHOCARDIOGRAM (TEE) (N/A) Left RADIAL ARTERY HARVEST (Left) Mobilize Diuresis d/c tubes/lines Continue foley due to trouble voiding if not standing preop , will leave foley until standing better UTI present preop on cipro hgb better this am Expected Acute  Blood - loss Anemia Continued thrombocytopenia- avoid heparin- send HIT panel     [name] 10/10/2014 7:58 AM

## 2014-10-10 NOTE — Evaluation (Signed)
Physical Therapy Evaluation Patient Details Name: Benjamin Cordova MRN: [id number] DOB: 1934-08-18 Today's Date: 10/10/2014   History of Present Illness  Pt is a 80 y/o male with a PMH of CAD, MI, s/p CABG x4 in 1981, HTN, polio, and TIA. He had not had chest pain in many years until the past 6 months. On 7/1 he was awakened by substernal chest pressure associated with diaphoresis and nausea. In the ED ECG showed occasional PVC's. He had a cardiac cath day PTA which revealed severe 3 vessel CAD with all grafts occluded. Pt is now s/p redo CABG on 10/09/14.   Clinical Impression  Pt admitted with above diagnosis. Pt currently with functional limitations due to the deficits listed below (see PT Problem List). At the time of PT eval pt was able to perform transfers with +2 assist for safety and support, and minimal ambulation due to reported fatigue. Discussed d/c options with pt and son, and pt refusing any follow-up other than HHPT. Advised that if progress was not seen with therapy, may have to consider a higher level of care at d/c (CIR vs. SNF), and pt stated his understanding. Pt will benefit from skilled PT to increase their independence and safety with mobility to allow discharge to the venue listed below.       Follow Up Recommendations Home health PT;Supervision/Assistance - 24 hour    Equipment Recommendations  Rolling walker with 5" wheels    Recommendations for Other Services OT consult     Precautions / Restrictions Precautions Precautions: Fall;Sternal Precaution Comments: R LE effected by polio, L LE painful from recent injury Restrictions Weight Bearing Restrictions: Yes (Sternal precautions)      Mobility  Bed Mobility Overal bed mobility: Needs Assistance;+2 for physical assistance Bed Mobility: Supine to Sit     Supine to sit: Mod assist;+2 for physical assistance     General bed mobility comments: Assist for LE movement towards EOB as well as for positioning of  hips (bed pad used for assist), and elevation of trunk into full sitting.   Transfers Overall transfer level: Needs assistance Equipment used: Rolling walker (2 wheeled) Transfers: Sit to/from Stand Sit to Stand: Min assist;+2 physical assistance         General transfer comment: Assist to power-up to full standing position. Pt was cued to hold onto heart pillow however did not make corrective changes and pulled to stand with the RW.   Ambulation/Gait Ambulation/Gait assistance: Min assist Ambulation Distance (Feet): 3 Feet Assistive device: Rolling walker (2 wheeled) Gait Pattern/deviations: Step-through pattern;Decreased stride length;Trunk flexed Gait velocity: Decreased Gait velocity interpretation: Below normal speed for age/gender General Gait Details: Pt was able to take a few steps before stating he would not do any more. Pt states he was extremely fatigued, however did not appear to be labored in his breathing, and appeared appropriately physically taxed when taking steps.   Stairs            Wheelchair Mobility    Modified Rankin (Stroke Patients Only)       Balance Overall balance assessment: Needs assistance Sitting-balance support: Feet supported;No upper extremity supported Sitting balance-Leahy Scale: Poor Sitting balance - Comments: Requires assist to maintain upright posture.  Postural control: Left lateral lean Standing balance support: Bilateral upper extremity supported;During functional activity Standing balance-Leahy Scale: Poor  Pertinent Vitals/Pain Pain Assessment: Faces Faces Pain Scale: Hurts little more Pain Location: Incisional pain Pain Descriptors / Indicators: Operative site guarding Pain Intervention(s): Limited activity within patient's tolerance;Monitored during session;Repositioned    Home Living Family/patient expects to be discharged to:: Private residence Living Arrangements:  Spouse/significant other Available Help at Discharge: Family;Available 24 hours/day Type of Home: House Home Access: Stairs to enter Entrance Stairs-Rails: Left Entrance Stairs-Number of Steps: 5 Home Layout: One level        Prior Function Level of Independence: Independent with assistive device(s)               Hand Dominance   Dominant Hand: Right    Extremity/Trunk Assessment   Upper Extremity Assessment: Defer to OT evaluation           Lower Extremity Assessment: RLE deficits/detail;LLE deficits/detail RLE Deficits / Details: Decreased strength and AROM consistent with polio LLE Deficits / Details: Pt reports a lawnmower rolled over on his L leg and is still very painful  Cervical / Trunk Assessment: Other exceptions  Communication   Communication: No difficulties  Cognition Arousal/Alertness: Awake/alert Behavior During Therapy: WFL for tasks assessed/performed Overall Cognitive Status: Within Functional Limits for tasks assessed                      General Comments      Exercises        Assessment/Plan    PT Assessment Patient needs continued PT services  PT Diagnosis Difficulty walking;Generalized weakness;Acute pain   PT Problem List Decreased strength;Decreased range of motion;Decreased activity tolerance;Decreased balance;Decreased mobility;Decreased knowledge of use of DME;Decreased safety awareness;Cardiopulmonary status limiting activity;Decreased knowledge of precautions;Pain  PT Treatment Interventions DME instruction;Gait training;Functional mobility training;Stair training;Therapeutic activities;Therapeutic exercise;Neuromuscular re-education;Patient/family education   PT Goals (Current goals can be found in the Care Plan section) Acute Rehab PT Goals Patient Stated Goal: Return home at d/c PT Goal Formulation: With patient/family Time For Goal Achievement: 10/17/14 Potential to Achieve Goals: Good    Frequency Min  3X/week   Barriers to discharge        Co-evaluation               End of Session Equipment Utilized During Treatment: Gait belt Activity Tolerance: Patient limited by fatigue Patient left: in chair;with call bell/phone within reach;with family/visitor present Nurse Communication: Mobility status         Time: 1308-6578 PT Time Calculation (min) (ACUTE ONLY): 21 min   Charges:   PT Evaluation $Initial PT Evaluation Tier I: 1 Procedure     PT G CodesRolinda Roan 2014-10-27, 2:10 PM   Rolinda Roan, PT, DPT Acute Rehabilitation Services Pager: [phone]

## 2014-10-11 ENCOUNTER — Encounter (HOSPITAL_COMMUNITY): Payer: Self-pay | Admitting: Thoracic Surgery (Cardiothoracic Vascular Surgery)

## 2014-10-11 ENCOUNTER — Inpatient Hospital Stay (HOSPITAL_COMMUNITY): Payer: Commercial Managed Care - HMO

## 2014-10-11 LAB — CBC
HCT: 23.5 % — ABNORMAL LOW (ref 39.0–52.0)
Hemoglobin: 7.9 g/dL — ABNORMAL LOW (ref 13.0–17.0)
MCH: 30.2 pg (ref 26.0–34.0)
MCHC: 33.6 g/dL (ref 30.0–36.0)
MCV: 89.7 fL (ref 78.0–100.0)
Platelets: 70 10*3/uL — ABNORMAL LOW (ref 150–400)
RBC: 2.62 MIL/uL — ABNORMAL LOW (ref 4.22–5.81)
RDW: 15.1 % (ref 11.5–15.5)
WBC: 10.6 10*3/uL — ABNORMAL HIGH (ref 4.0–10.5)

## 2014-10-11 LAB — PROTIME-INR
INR: 1.18 (ref 0.00–1.49)
Prothrombin Time: 15.1 seconds (ref 11.6–15.2)

## 2014-10-11 LAB — GLUCOSE, CAPILLARY
GLUCOSE-CAPILLARY: 126 mg/dL — AB (ref 65–99)
Glucose-Capillary: 119 mg/dL — ABNORMAL HIGH (ref 65–99)
Glucose-Capillary: 117 mg/dL — ABNORMAL HIGH (ref 65–99)
Glucose-Capillary: 129 mg/dL — ABNORMAL HIGH (ref 65–99)
Glucose-Capillary: 156 mg/dL — ABNORMAL HIGH (ref 65–99)

## 2014-10-11 LAB — BASIC METABOLIC PANEL
Anion gap: 6 (ref 5–15)
BUN: 24 mg/dL — ABNORMAL HIGH (ref 6–20)
CO2: 30 mmol/L (ref 22–32)
Calcium: 8.1 mg/dL — ABNORMAL LOW (ref 8.9–10.3)
Chloride: 99 mmol/L — ABNORMAL LOW (ref 101–111)
Creatinine, Ser: 0.8 mg/dL (ref 0.61–1.24)
GFR calc Af Amer: >60 mL/min (ref >60)
GFR calc non Af Amer: >60 mL/min (ref >60)
Glucose, Bld: 122 mg/dL — ABNORMAL HIGH (ref 65–99)
Potassium: 4 mmol/L (ref 3.5–5.1)
Sodium: 135 mmol/L (ref 135–145)

## 2014-10-11 MED ORDER — POTASSIUM CHLORIDE CRYS ER 20 MEQ PO TBCR
20.0000 meq | EXTENDED_RELEASE_TABLET | Freq: Two times a day (BID) | ORAL | Status: DC
  Administered 2014-10-11 – 2014-10-14 (×7): 20 meq via ORAL
  Filled 2014-10-11 (×9): qty 1

## 2014-10-11 MED ORDER — WARFARIN SODIUM 5 MG PO TABS
5.0000 mg | ORAL_TABLET | Freq: Every day | ORAL | Status: DC
  Administered 2014-10-11: 5 mg via ORAL
  Filled 2014-10-11 (×2): qty 1

## 2014-10-11 MED ORDER — ASPIRIN 81 MG PO CHEW: 324.0000 mg | CHEWABLE_TABLET | Freq: Every day | ORAL | Status: DC

## 2014-10-11 MED ORDER — INSULIN DETEMIR 100 UNIT/ML ~~LOC~~ SOLN
15.0000 [IU] | Freq: Every day | SUBCUTANEOUS | Status: DC
  Administered 2014-10-11: 15 [IU] via SUBCUTANEOUS
  Filled 2014-10-11 (×3): qty 0.15

## 2014-10-11 MED ORDER — FUROSEMIDE 40 MG PO TABS
40.0000 mg | ORAL_TABLET | Freq: Every day | ORAL | Status: DC
  Administered 2014-10-11 – 2014-10-14 (×4): 40 mg via ORAL
  Filled 2014-10-11 (×4): qty 1

## 2014-10-11 MED ORDER — METOPROLOL TARTRATE 25 MG PO TABS
25.0000 mg | ORAL_TABLET | Freq: Three times a day (TID) | ORAL | Status: DC
  Administered 2014-10-11 – 2014-10-12 (×4): 25 mg via ORAL
  Filled 2014-10-11 (×7): qty 1

## 2014-10-11 MED ORDER — INSULIN ASPART 100 UNIT/ML ~~LOC~~ SOLN
0.0000 [IU] | Freq: Three times a day (TID) | SUBCUTANEOUS | Status: DC
  Administered 2014-10-11 – 2014-10-12 (×3): 2 [IU] via SUBCUTANEOUS

## 2014-10-11 MED ORDER — ASPIRIN EC 325 MG PO TBEC
325.0000 mg | DELAYED_RELEASE_TABLET | Freq: Every day | ORAL | Status: DC
  Administered 2014-10-11 – 2014-10-12 (×2): 325 mg via ORAL
  Filled 2014-10-11 (×3): qty 1

## 2014-10-11 MED FILL — Potassium Chloride Inj 2 mEq/ML: INTRAVENOUS | Qty: 40 | Status: AC

## 2014-10-11 MED FILL — Heparin Sodium (Porcine) Inj 1000 Unit/ML: INTRAMUSCULAR | Qty: 30 | Status: AC

## 2014-10-11 MED FILL — Dexmedetomidine HCl in NaCl 0.9% IV Soln 400 MCG/100ML: INTRAVENOUS | Qty: 100 | Status: AC

## 2014-10-11 MED FILL — Magnesium Sulfate Inj 50%: INTRAMUSCULAR | Qty: 10 | Status: AC

## 2014-10-11 NOTE — Care Management Note (Signed)
Case Management Note  Patient Details  Name: Benjamin Cordova MRN: [id number] Date of Birth: [birth year]  Subjective/Objective:        Pt lives with spouse, (I) PTA.  Discussed PT recommendation of home therapy and rolling walker, provided list of home health agencies.  Pt thinks he will recover sufficiently before discharge so that he will have no need for services, has a standard walker and a rollator @ home.  Left list of agencies with pt, he will choose agency if he needs services when close to discharge.           Action/Plan:               Expected Discharge Plan:  [place]  Discharge planning Services  CM Consult  Status of Service:  In process, will continue to follow  Medicare Important Message Given:  Yes-third notification given  Girard Cooter, RN 10/11/2014, 10:59 AM

## 2014-10-11 NOTE — Progress Notes (Signed)
TCTS BRIEF SICU PROGRESS NOTE  3 Days Post-Op  S/P Procedure(s) (LRB): REDO CORONARY ARTERY BYPASS GRAFTING (CABG) Times Two Grafts using Left Internal Mammary and Left Radial Artery (N/A) TRANSESOPHAGEAL ECHOCARDIOGRAM (TEE) (N/A) Left RADIAL ARTERY HARVEST (Left)   Looks good and feels well Ambulated some w/ PT today Sinus tach w/ stable BP O2 sats stable on RA  Plan: Continue current plan  Rexene Alberts 10/11/2014 7:33 PM

## 2014-10-11 NOTE — Progress Notes (Addendum)
Physical Therapy Treatment Patient Details Name: Benjamin Cordova MRN: [id number] DOB: [birth year] Today's Date: 10/11/2014    History of Present Illness Pt is a 80 y/o male with a PMH of CAD, MI, s/p CABG x4 in 1981, HTN, polio, and TIA. He had not had chest pain in many years until the past 6 months. On 7/1 he was awakened by substernal chest pressure associated with diaphoresis and nausea. In the ED ECG showed occasional PVC's. He had a cardiac cath day PTA which revealed severe 3 vessel CAD with all grafts occluded. Pt is now s/p redo CABG on 10/09/14.     PT Comments    Patient much more cooperative and actually eager to attempt ambulation. Remains very weak requiring 2 person mod assist to stand. Very shakey walking 66 ft with RW and HR 120-146 bpm. Educated on leg exercises to assist with decreasing edema.    Follow Up Recommendations  Home health PT;Supervision/Assistance - 24 hour     Equipment Recommendations  None recommended by PT (reports he has 2 and 4 wheel walkers)    Recommendations for Other Services OT consult     Precautions / Restrictions Precautions Precautions: Fall;Sternal Precaution Comments: R LE effected by polio, L LE painful from recent injury Restrictions Weight Bearing Restrictions:  (Sternal precautions)    Mobility  Bed Mobility Overal bed mobility: Needs Assistance;+2 for physical assistance Bed Mobility: Sit to Sidelying;Rolling Rolling: Mod assist       Sit to sidelying: Mod assist;+2 for physical assistance General bed mobility comments: Assist for LEs onto bed and to direct torso to his side  Transfers Overall transfer level: Needs assistance Equipment used: Rolling walker (2 wheeled) Transfers: Sit to/from Stand Sit to Stand: +2 physical assistance;Mod assist;From elevated surface         General transfer comment: Assist to power-up to full standing position from recliner with cushion in seat. pt adhered to sternal  precautions  Ambulation/Gait Ambulation/Gait assistance: Min assist;+2 safety/equipment Ambulation Distance (Feet): 70 Feet Assistive device: Rolling walker (2 wheeled) Gait Pattern/deviations: Step-through pattern;Decreased stride length;Shuffle;Trunk flexed Gait velocity: Decreased   General Gait Details: Sherene Sires (pt denies h/o tremor and states due to fatigue/weakness); assist with RW   Stairs            Wheelchair Mobility    Modified Rankin (Stroke Patients Only)       Balance   Sitting-balance support: No upper extremity supported;Feet unsupported Sitting balance-Leahy Scale: Zero Sitting balance - Comments: Requires assist to maintain upright posture.  Postural control: Posterior lean Standing balance support: Bilateral upper extremity supported Standing balance-Leahy Scale: Poor                      Cognition Arousal/Alertness: Awake/alert Behavior During Therapy: WFL for tasks assessed/performed Overall Cognitive Status: Within Functional Limits for tasks assessed                      Exercises General Exercises - Lower Extremity Ankle Circles/Pumps: AROM;Left (unable on Rt) Quad Sets: AROM;Both;5 reps;Supine    General Comments        Pertinent Vitals/Pain Pain Assessment: Faces Faces Pain Scale: Hurts little more Pain Location: chest Pain Descriptors / Indicators: Operative site guarding Pain Intervention(s): Limited activity within patient's tolerance;Monitored during session    Home Living               Home Equipment: Walker - 2 wheels;Walker - 4 wheels (further DME not discussed)  Prior Function            PT Goals (current goals can now be found in the care plan section) Acute Rehab PT Goals Patient Stated Goal: Return home at d/c Time For Goal Achievement: 10/17/14 Progress towards PT goals: Progressing toward goals    Frequency  Min 3X/week    PT Plan Current plan remains appropriate     Co-evaluation             End of Session Equipment Utilized During Treatment: Gait belt Activity Tolerance: Patient limited by fatigue Patient left: with call bell/phone within reach;in bed;with nursing/sitter in room     Time: 0131-4388 PT Time Calculation (min) (ACUTE ONLY): 22 min  Charges:  $Gait Training: 8-22 mins                    G Codes:      Toshie Demelo 11/08/14, 1:54 PM Pager [phone]

## 2014-10-11 NOTE — Care Management (Signed)
Important Message  Patient Details  Name: Benjamin Cordova MRN: [id number] Date of Birth: [birth year]   Medicare Important Message Given:  Yes-fourth notification given    Nathen May 10/11/2014, 4:07 PM

## 2014-10-11 NOTE — Progress Notes (Signed)
3 Days Post-Op Procedure(s) (LRB): REDO CORONARY ARTERY BYPASS GRAFTING (CABG) Times Two Grafts using Left Internal Mammary and Left Radial Artery (N/A) TRANSESOPHAGEAL ECHOCARDIOGRAM (TEE) (N/A) Left RADIAL ARTERY HARVEST (Left) Subjective: Feels better today. Knee not hurting as badly  Objective: Vital signs in last 24 hours: Temp:  [98.1 F (36.7 C)-99.1 F (37.3 C)] 98.1 F (36.7 C) (07/11 0400) Pulse Rate:  [98-135] 116 (07/11 0700) Cardiac Rhythm:  [-] Sinus tachycardia (07/11 0730) Resp:  [18-32] 25 (07/11 0700) BP: (96-156)/(57-101) 128/89 mmHg (07/11 0700) SpO2:  [92 %-97 %] 95 % (07/11 0700) Weight:  [225 lb 8.5 oz (102.3 kg)] 225 lb 8.5 oz (102.3 kg) (07/11 0600)  Hemodynamic parameters for last 24 hours:    Intake/Output from previous day: 07/10 0701 - 07/11 0700 In: 1370 [P.O.:1320; IV Piggyback:50] Out: 3070 [Urine:3070] Intake/Output this shift:    General appearance: alert, cooperative and no distress Neurologic: intact Heart: tachy, regular Lungs: diminished breath sounds bibasilar Abdomen: normal findings: soft, non-tender  Lab Results:  Recent Labs  10/10/14 0400 10/11/14 0402  WBC 10.0 10.6*  HGB 8.1* 7.9*  HCT 23.8* 23.5*  PLT 59* 70*   BMET:  Recent Labs  10/10/14 0400 10/11/14 0402  NA 131* 135  K 4.0 4.0  CL 97* 99*  CO2 27 30  GLUCOSE 160* 122*  BUN 16 24*  CREATININE 0.93 0.80  CALCIUM 8.0* 8.1*    PT/INR:  Recent Labs  10/11/14 0402  LABPROT 15.1  INR 1.18   ABG    Component Value Date/Time   PHART 7.374 10/09/2014 0137   HCO3 24.7* 10/09/2014 0137   TCO2 22 10/09/2014 1526   ACIDBASEDEF 1.0 10/08/2014 2240   O2SAT 99.0 10/09/2014 0137   CBG (last 3)   Recent Labs  10/10/14 1937 10/10/14 2336 10/11/14 0354  GLUCAP 167* 148* 119*    Assessment/Plan: S/P Procedure(s) (LRB): REDO CORONARY ARTERY BYPASS GRAFTING (CABG) Times Two Grafts using Left Internal Mammary and Left Radial Artery  (N/A) TRANSESOPHAGEAL ECHOCARDIOGRAM (TEE) (N/A) Left RADIAL ARTERY HARVEST (Left) -  POD # 3 redo CABG  CV- still tachycardic, will increase metoprolol to TID  RESP- IS for atelectasis  RENAL- creatinine and lytes OK, PO lasix  ENDO_ CBG still elevated, start levemir change to [facility] and HS CBG/ SSI  Thrombocytopenia- will check HIT  No enoxaparin, SCD for DVT prophylaxis  ORTHO_ left knee less painful today. PT consult, mobilize as tolerated   LOS: 10 days    Benjamin Cordova 10/11/2014

## 2014-10-12 LAB — BASIC METABOLIC PANEL
Anion gap: 8 (ref 5–15)
BUN: 27 mg/dL — ABNORMAL HIGH (ref 6–20)
CALCIUM: 8.4 mg/dL — AB (ref 8.9–10.3)
CO2: 27 mmol/L (ref 22–32)
CREATININE: 0.78 mg/dL (ref 0.61–1.24)
Chloride: 100 mmol/L — ABNORMAL LOW (ref 101–111)
GFR calc Af Amer: >60 mL/min (ref >60)
GFR calc non Af Amer: >60 mL/min (ref >60)
GLUCOSE: 124 mg/dL — AB (ref 65–99)
Potassium: 4.5 mmol/L (ref 3.5–5.1)
Sodium: 135 mmol/L (ref 135–145)

## 2014-10-12 LAB — GLUCOSE, CAPILLARY
GLUCOSE-CAPILLARY: 138 mg/dL — AB (ref 65–99)
GLUCOSE-CAPILLARY: 117 mg/dL — AB (ref 65–99)
Glucose-Capillary: 96 mg/dL (ref 65–99)
Glucose-Capillary: 117 mg/dL — ABNORMAL HIGH (ref 65–99)

## 2014-10-12 LAB — CBC
HCT: 23.4 % — ABNORMAL LOW (ref 39.0–52.0)
Hemoglobin: 7.9 g/dL — ABNORMAL LOW (ref 13.0–17.0)
MCH: 30.3 pg (ref 26.0–34.0)
MCHC: 33.8 g/dL (ref 30.0–36.0)
MCV: 89.7 fL (ref 78.0–100.0)
Platelets: 96 10*3/uL — ABNORMAL LOW (ref 150–400)
RBC: 2.61 MIL/uL — ABNORMAL LOW (ref 4.22–5.81)
RDW: 14.8 % (ref 11.5–15.5)
WBC: 10.4 10*3/uL (ref 4.0–10.5)

## 2014-10-12 LAB — PROTIME-INR
INR: 1.11 (ref 0.00–1.49)
Prothrombin Time: 14.5 s (ref 11.6–15.2)

## 2014-10-12 MED ORDER — ZOLPIDEM TARTRATE 5 MG PO TABS
5.0000 mg | ORAL_TABLET | Freq: Every evening | ORAL | Status: DC | PRN
  Administered 2014-10-13: 5 mg via ORAL
  Filled 2014-10-12: qty 1

## 2014-10-12 MED ORDER — ALBUTEROL SULFATE (2.5 MG/3ML) 0.083% IN NEBU
2.5000 mg | INHALATION_SOLUTION | Freq: Four times a day (QID) | RESPIRATORY_TRACT | Status: DC | PRN
  Administered 2014-10-12: 2.5 mg via RESPIRATORY_TRACT
  Filled 2014-10-12: qty 3

## 2014-10-12 MED ORDER — CIPROFLOXACIN HCL 500 MG PO TABS
500.0000 mg | ORAL_TABLET | Freq: Every day | ORAL | Status: DC
Start: 2014-10-13 — End: 2014-10-14
  Administered 2014-10-13 – 2014-10-14 (×2): 500 mg via ORAL
  Filled 2014-10-12 (×3): qty 1

## 2014-10-12 MED ORDER — FOLIC ACID 1 MG PO TABS
1.0000 mg | ORAL_TABLET | Freq: Every day | ORAL | Status: DC
  Administered 2014-10-12 – 2014-10-14 (×3): 1 mg via ORAL
  Filled 2014-10-12 (×3): qty 1

## 2014-10-12 MED ORDER — RISAQUAD PO CAPS
1.0000 | ORAL_CAPSULE | Freq: Every day | ORAL | Status: DC
  Administered 2014-10-12 – 2014-10-14 (×3): 1 via ORAL
  Filled 2014-10-12 (×3): qty 1

## 2014-10-12 MED ORDER — SODIUM CHLORIDE 0.9 % IV SOLN: 250.0000 mL | INTRAVENOUS | Status: DC | PRN

## 2014-10-12 MED ORDER — SODIUM CHLORIDE 0.9 % IJ SOLN: 3.0000 mL | INTRAMUSCULAR | Status: DC | PRN

## 2014-10-12 MED ORDER — MAGNESIUM HYDROXIDE 400 MG/5ML PO SUSP: 30.0000 mL | Freq: Every day | ORAL | Status: DC | PRN

## 2014-10-12 MED ORDER — ALUM & MAG HYDROXIDE-SIMETH 200-200-20 MG/5ML PO SUSP: 15.0000 mL | ORAL | Status: DC | PRN

## 2014-10-12 MED ORDER — ATENOLOL 50 MG PO TABS
50.0000 mg | ORAL_TABLET | Freq: Two times a day (BID) | ORAL | Status: DC
  Administered 2014-10-12 – 2014-10-14 (×5): 50 mg via ORAL
  Filled 2014-10-12 (×6): qty 1

## 2014-10-12 MED ORDER — SODIUM CHLORIDE 0.9 % IJ SOLN
3.0000 mL | Freq: Two times a day (BID) | INTRAMUSCULAR | Status: DC
  Administered 2014-10-12 – 2014-10-14 (×4): 3 mL via INTRAVENOUS

## 2014-10-12 MED ORDER — MOVING RIGHT ALONG BOOK
Freq: Once | Status: AC
  Administered 2014-10-12: 09:00:00
  Filled 2014-10-12: qty 1

## 2014-10-12 MED ORDER — WARFARIN SODIUM 7.5 MG PO TABS
7.5000 mg | ORAL_TABLET | Freq: Every day | ORAL | Status: DC
  Administered 2014-10-12: 7.5 mg via ORAL
  Filled 2014-10-12 (×2): qty 1

## 2014-10-12 MED ORDER — NAPROXEN SODIUM 275 MG PO TABS
440.0000 mg | ORAL_TABLET | Freq: Every day | ORAL | Status: DC | PRN
  Administered 2014-10-14: 412.5 mg via ORAL
  Filled 2014-10-12 (×3): qty 2

## 2014-10-12 MED FILL — Heparin Sodium (Porcine) Inj 1000 Unit/ML: INTRAMUSCULAR | Qty: 30 | Status: AC

## 2014-10-12 MED FILL — Sodium Chloride IV Soln 0.9%: INTRAVENOUS | Qty: 2000 | Status: AC

## 2014-10-12 MED FILL — Electrolyte-R (PH 7.4) Solution: INTRAVENOUS | Qty: 3000 | Status: AC

## 2014-10-12 MED FILL — Lidocaine HCl IV Inj 20 MG/ML: INTRAVENOUS | Qty: 5 | Status: AC

## 2014-10-12 MED FILL — Sodium Bicarbonate IV Soln 8.4%: INTRAVENOUS | Qty: 100 | Status: AC

## 2014-10-12 MED FILL — Mannitol IV Soln 20%: INTRAVENOUS | Qty: 500 | Status: AC

## 2014-10-12 NOTE — Progress Notes (Signed)
SICU p.m. Rounds  Patient examined and record reviewed.Hemodynamics stable,labs satisfactory.Patient had stable day.Continue current care. Waiting for transfer to stepdown Tharon Aquas Trigt III 10/12/2014

## 2014-10-12 NOTE — Discharge Instructions (Addendum)
We ask the SNF to please do the following: 1. Please obtain vital signs at least one time daily 2.Please weigh the patient daily. If he or she continues to gain weight or develops lower extremity edema, contact the office at [phone]. 3. Ambulate patient at least three times daily and please use sternal precautions.   Information on my medicine - Coumadin   (Warfarin) Why was Coumadin prescribed for you? Coumadin was prescribed for you because you have a blood clot or a medical condition that can cause an increased risk of forming blood clots. Blood clots can cause serious health problems by blocking the flow of blood to the heart, lung, or brain. Coumadin can prevent harmful blood clots from forming. As a reminder your indication for Coumadin is:   Stroke Prevention Because Of Atrial Fibrillation  What test will check on my response to Coumadin? While on Coumadin (warfarin) you will need to have an INR test regularly to ensure that your dose is keeping you in the desired range. The INR (international normalized ratio) number is calculated from the result of the laboratory test called prothrombin time (PT).  If an INR APPOINTMENT HAS NOT ALREADY BEEN MADE FOR YOU please schedule an appointment to have this lab work done by your health care provider within 7 days. Your INR goal is usually a number between:  2 to 3 or your provider may give you a more narrow range like 2-2.5.  Ask your health care provider during an office visit what your goal INR is.  What  do you need to  know  About  COUMADIN? Take Coumadin (warfarin) exactly as prescribed by your healthcare provider about the same time each day.  DO NOT stop taking without talking to the doctor who prescribed the medication.  Stopping without other blood clot prevention medication to take the place of Coumadin may increase your risk of developing a new clot or stroke.  Get refills before you run out.  What do you do if you miss a  dose? If you miss a dose, take it as soon as you remember on the same day then continue your regularly scheduled regimen the next day.  Do not take two doses of Coumadin at the same time.  Important Safety Information A possible side effect of Coumadin (Warfarin) is an increased risk of bleeding. You should call your healthcare provider right away if you experience any of the following: ? Bleeding from an injury or your nose that does not stop. ? Unusual colored urine (red or dark brown) or unusual colored stools (red or black). ? Unusual bruising for unknown reasons. ? A serious fall or if you hit your head (even if there is no bleeding).  Some foods or medicines interact with Coumadin (warfarin) and might alter your response to warfarin. To help avoid this: ? Eat a balanced diet, maintaining a consistent amount of Vitamin K. ? Notify your provider about major diet changes you plan to make. ? Avoid alcohol or limit your intake to 1 drink for women and 2 drinks for men per day. (1 drink is 5 oz. wine, 12 oz. beer, or 1.5 oz. liquor.)  Make sure that ANY health care provider who prescribes medication for you knows that you are taking Coumadin (warfarin).  Also make sure the healthcare provider who is monitoring your Coumadin knows when you have started a new medication including herbals and non-prescription products.  Coumadin (Warfarin)  Major Drug Interactions  Increased Warfarin Effect  Decreased Warfarin Effect  Alcohol (large quantities) Antibiotics (esp. Septra/Bactrim, Flagyl, Cipro) Amiodarone (Cordarone) Aspirin (ASA) Cimetidine (Tagamet) Megestrol (Megace) NSAIDs (ibuprofen, naproxen, etc.) Piroxicam (Feldene) Propafenone (Rythmol SR) Propranolol (Inderal) Isoniazid (INH) Posaconazole (Noxafil) Barbiturates (Phenobarbital) Carbamazepine (Tegretol) Chlordiazepoxide (Librium) Cholestyramine (Questran) Griseofulvin Oral Contraceptives Rifampin Sucralfate  (Carafate) Vitamin K   Coumadin (Warfarin) Major Herbal Interactions  Increased Warfarin Effect Decreased Warfarin Effect  Garlic Ginseng Ginkgo biloba Coenzyme Q10 Green tea St. Johns wort    Coumadin (Warfarin) FOOD Interactions  Eat a consistent number of servings per week of foods HIGH in Vitamin K (1 serving =  cup)  Collards (cooked, or boiled & drained) Kale (cooked, or boiled & drained) Mustard greens (cooked, or boiled & drained) Parsley *serving size only =  cup Spinach (cooked, or boiled & drained) Swiss chard (cooked, or boiled & drained) Turnip greens (cooked, or boiled & drained)  Eat a consistent number of servings per week of foods MEDIUM-HIGH in Vitamin K (1 serving = 1 cup)  Asparagus (cooked, or boiled & drained) Broccoli (cooked, boiled & drained, or raw & chopped) Brussel sprouts (cooked, or boiled & drained) *serving size only =  cup Lettuce, raw (green leaf, endive, romaine) Spinach, raw Turnip greens, raw & chopped   These websites have more information on Coumadin (warfarin):  FailFactory.se; VeganReport.com.au;  Activity: 1.May walk up steps                2.No lifting more than ten pounds for four weeks.                 3.No driving for four weeks.                4.Stop any activity that causes chest pain, shortness of breath, dizziness, sweating or excessive weakness.                5.Avoid straining.                6.Continue with your breathing exercises daily.  Diet: Low sugar diet and Low fat, Low salt diet  Wound Care: May shower.  Clean wounds with mild soap and water daily. Contact the office at [phone] if any problems arise.  Coronary Artery Bypass Grafting, Care After Refer to this sheet in the next few weeks. These instructions provide you with information on caring for yourself after your procedure. Your health care provider may also give you more specific instructions. Your treatment has been planned  according to current medical practices, but problems sometimes occur. Call your health care provider if you have any problems or questions after your procedure. WHAT TO EXPECT AFTER THE PROCEDURE Recovery from surgery will be different for everyone. Some people feel well after 3 or 4 weeks, while for others it takes longer. After your procedure, it is typical to have the following:  Nausea and a lack of appetite.   Constipation.  Weakness and fatigue.   Depression or irritability.   Pain or discomfort at your incision site. HOME CARE INSTRUCTIONS  Take medicines only as directed by your health care provider. Do not stop taking medicines or start any new medicines without first checking with your health care provider.  Take your pulse as directed by your health care provider.  Perform deep breathing as directed by your health care provider. If you were given a device called an incentive spirometer, use it to practice deep breathing several times a day. Support your chest with a pillow or your arms  when you take deep breaths or cough.  Keep incision areas clean, dry, and protected. Remove or change any bandages (dressings) only as directed by your health care provider. You may have skin adhesive strips over the incision areas. Do not take the strips off. They will fall off on their own.  Check incision areas daily for any swelling, redness, or drainage.  If incisions were made in your legs, do the following:  Avoid crossing your legs.   Avoid sitting for long periods of time. Change positions every 30 minutes.   Elevate your legs when you are sitting.  Wear compression stockings as directed by your health care provider. These stockings help keep blood clots from forming in your legs.  Take showers once your health care provider approves. Until then, only take sponge baths. Pat incisions dry. Do not rub incisions with a washcloth or towel. Do not take baths, swim, or use a hot  tub until your health care provider approves.  Eat foods that are high in fiber, such as raw fruits and vegetables, whole grains, beans, and nuts. Meats should be lean cut. Avoid canned, processed, and fried foods.  Drink enough fluid to keep your urine clear or pale yellow.  Weigh yourself every day. This helps identify if you are retaining fluid that may make your heart and lungs work harder.  Rest and limit activity as directed by your health care provider. You may be instructed to:  Stop any activity at once if you have chest pain, shortness of breath, irregular heartbeats, or dizziness. Get help right away if you have any of these symptoms.  Move around frequently for short periods or take short walks as directed by your health care provider. Increase your activities gradually. You may need physical therapy or cardiac rehabilitation to help strengthen your muscles and build your endurance.  Avoid lifting, pushing, or pulling anything heavier than 10 lb (4.5 kg) for at least 6 weeks after surgery.  Do not drive until your health care provider approves.  Ask your health care provider when you may return to work.  Ask your health care provider when you may resume sexual activity.  Keep all follow-up visits as directed by your health care provider. This is important. SEEK MEDICAL CARE IF:  You have swelling, redness, increasing pain, or drainage at the site of an incision.  You have a fever.  You have swelling in your ankles or legs.  You have pain in your legs.   You gain 2 or more pounds (0.9 kg) a day.  You are nauseous or vomit.  You have diarrhea. SEEK IMMEDIATE MEDICAL CARE IF:  You have chest pain that goes to your jaw or arms.  You have shortness of breath.   You have a fast or irregular heartbeat.   You notice a "clicking" in your breastbone (sternum) when you move.   You have numbness or weakness in your arms or legs.  You feel dizzy or light-headed.   MAKE SURE YOU:  Understand these instructions.  Will watch your condition.  Will get help right away if you are not doing well or get worse. Document Released: 10/06/2004 Document Revised: 08/03/2013 Document Reviewed: 08/26/2012 [facility] Patient Information 2015 [place], [place]. This information is not intended to replace advice given to you by your health care provider. Make sure you discuss any questions you have with your health care provider.

## 2014-10-12 NOTE — Progress Notes (Signed)
4 Days Post-Op Procedure(s) (LRB): REDO CORONARY ARTERY BYPASS GRAFTING (CABG) Times Two Grafts using Left Internal Mammary and Left Radial Artery (N/A) TRANSESOPHAGEAL ECHOCARDIOGRAM (TEE) (N/A) Left RADIAL ARTERY HARVEST (Left) Subjective: Feels better this AM Ambulated with assistance Knee not hurting as badly  Objective: Vital signs in last 24 hours: Temp:  [97.3 F (36.3 C)-98.5 F (36.9 C)] 98.5 F (36.9 C) (07/12 0735) Pulse Rate:  [102-129] 117 (07/12 0700) Cardiac Rhythm:  [-] Sinus tachycardia (07/12 0724) Resp:  [21-55] 46 (07/12 0700) BP: (114-162)/(61-96) 162/89 mmHg (07/12 0700) SpO2:  [94 %-99 %] 96 % (07/12 0700) Weight:  [223 lb 1.7 oz (101.2 kg)] 223 lb 1.7 oz (101.2 kg) (07/12 0615)  Hemodynamic parameters for last 24 hours:    Intake/Output from previous day: 07/11 0701 - 07/12 0700 In: 720 [P.O.:720] Out: 1370 [Urine:1370] Intake/Output this shift:    General appearance: alert and no distress Neurologic: intact Heart: tachy with occassional ectopy Lungs: diminished breath sounds bibasilar Abdomen: normal findings: soft, non-tender Extremities: well perfused Wound: clean and dry  Lab Results:  Recent Labs  10/11/14 0402 10/12/14 0228  WBC 10.6* 10.4  HGB 7.9* 7.9*  HCT 23.5* 23.4*  PLT 70* 96*   BMET:  Recent Labs  10/11/14 0402 10/12/14 0228  NA 135 135  K 4.0 4.5  CL 99* 100*  CO2 30 27  GLUCOSE 122* 124*  BUN 24* 27*  CREATININE 0.80 0.78  CALCIUM 8.1* 8.4*    PT/INR:  Recent Labs  10/12/14 0228  LABPROT 14.5  INR 1.11   ABG    Component Value Date/Time   PHART 7.374 10/09/2014 0137   HCO3 24.7* 10/09/2014 0137   TCO2 22 10/09/2014 1526   ACIDBASEDEF 1.0 10/08/2014 2240   O2SAT 99.0 10/09/2014 0137   CBG (last 3)   Recent Labs  10/11/14 1231 10/11/14 1636 10/11/14 2151  GLUCAP 126* 129* 156*    Assessment/Plan: S/P Procedure(s) (LRB): REDO CORONARY ARTERY BYPASS GRAFTING (CABG) Times Two Grafts using  Left Internal Mammary and Left Radial Artery (N/A) TRANSESOPHAGEAL ECHOCARDIOGRAM (TEE) (N/A) Left RADIAL ARTERY HARVEST (Left) Plan for transfer to step-down: see transfer orders   CV- still tachy- change to atenolol 50 BID  Increase coumadin to 7.5 mg daily  RESP- continue IS  RENAL_ creatinine OK, continue PO lasix  ENDO- CBG better  Anemia secondary to ABL:- stable  Mobility improving   LOS: 11 days    Benjamin Cordova 10/12/2014

## 2014-10-12 NOTE — Progress Notes (Signed)
RN paged Dr. Claiborne Billings due to pt wheezing. New orders received for PRN Albuterol Q6H for wheezing. Pt not in any distress at this time. RN will page RT to admin treatment and cont to monitor pt.

## 2014-10-13 ENCOUNTER — Inpatient Hospital Stay (HOSPITAL_COMMUNITY): Payer: Commercial Managed Care - HMO

## 2014-10-13 LAB — BASIC METABOLIC PANEL
Anion gap: 10 (ref 5–15)
BUN: 26 mg/dL — ABNORMAL HIGH (ref 6–20)
CO2: 25 mmol/L (ref 22–32)
Calcium: 8.6 mg/dL — ABNORMAL LOW (ref 8.9–10.3)
Chloride: 103 mmol/L (ref 101–111)
Creatinine, Ser: 0.76 mg/dL (ref 0.61–1.24)
GFR calc non Af Amer: >60 mL/min (ref >60)
Glucose, Bld: 121 mg/dL — ABNORMAL HIGH (ref 65–99)
Potassium: 4.2 mmol/L (ref 3.5–5.1)
Sodium: 138 mmol/L (ref 135–145)

## 2014-10-13 LAB — PROTIME-INR
INR: 1.08 (ref 0.00–1.49)
PROTHROMBIN TIME: 14.2 s (ref 11.6–15.2)

## 2014-10-13 LAB — CBC
HCT: 24.8 % — ABNORMAL LOW (ref 39.0–52.0)
HEMOGLOBIN: 8.1 g/dL — AB (ref 13.0–17.0)
MCH: 29.9 pg (ref 26.0–34.0)
MCHC: 32.7 g/dL (ref 30.0–36.0)
MCV: 91.5 fL (ref 78.0–100.0)
Platelets: 115 10*3/uL — ABNORMAL LOW (ref 150–400)
RBC: 2.71 MIL/uL — ABNORMAL LOW (ref 4.22–5.81)
RDW: 14.8 % (ref 11.5–15.5)
WBC: 9 10*3/uL (ref 4.0–10.5)

## 2014-10-13 LAB — HIT PANEL (HEPARIN AB + SRA)
Heparin Induced Plt Ab: 0.186 OD (ref 0.000–0.400)
Heparin Induced Plt Ab: 0.184 OD (ref 0.000–0.400)
SRA .2 IU/mL UFH Ser-aCnc: 1 % (ref 0–20)
SRA 100IU/mL UFH Ser-aCnc: 2 % (ref 0–20)
SRA, HIGH DOSE HEPARIN: 1 % (ref 0–20)
SRA, LOW DOSE HEPARIN: 1 % (ref 0–20)

## 2014-10-13 LAB — GLUCOSE, CAPILLARY: GLUCOSE-CAPILLARY: 108 mg/dL — AB (ref 65–99)

## 2014-10-13 MED ORDER — LEVALBUTEROL HCL 0.63 MG/3ML IN NEBU
0.6300 mg | INHALATION_SOLUTION | RESPIRATORY_TRACT | Status: AC
  Administered 2014-10-13: 0.63 mg via RESPIRATORY_TRACT
  Filled 2014-10-13: qty 3

## 2014-10-13 MED ORDER — WARFARIN SODIUM 10 MG PO TABS
10.0000 mg | ORAL_TABLET | Freq: Every day | ORAL | Status: DC
  Administered 2014-10-13: 10 mg via ORAL
  Filled 2014-10-13 (×2): qty 1

## 2014-10-13 MED ORDER — LEVALBUTEROL HCL 0.63 MG/3ML IN NEBU
0.6300 mg | INHALATION_SOLUTION | Freq: Four times a day (QID) | RESPIRATORY_TRACT | Status: DC | PRN
  Administered 2014-10-13 – 2014-10-14 (×3): 0.63 mg via RESPIRATORY_TRACT
  Filled 2014-10-13 (×3): qty 3

## 2014-10-13 MED ORDER — ASPIRIN EC 81 MG PO TBEC
81.0000 mg | DELAYED_RELEASE_TABLET | Freq: Every day | ORAL | Status: DC
  Administered 2014-10-13 – 2014-10-14 (×2): 81 mg via ORAL
  Filled 2014-10-13 (×2): qty 1

## 2014-10-13 NOTE — Progress Notes (Addendum)
      [address]       ,[place] [address]             [phone]        5 Days Post-Op Procedure(s) (LRB): REDO CORONARY ARTERY BYPASS GRAFTING (CABG) Times Two Grafts using Left Internal Mammary and Left Radial Artery (N/A) TRANSESOPHAGEAL ECHOCARDIOGRAM (TEE) (N/A) Left RADIAL ARTERY HARVEST (Left)  Subjective: Patient with no specific complaints.  Objective: Vital signs in last 24 hours: Temp:  [98.2 F (36.8 C)-98.9 F (37.2 C)] 98.5 F (36.9 C) (07/13 0615) Pulse Rate:  [85-108] 86 (07/13 0615) Cardiac Rhythm:  [-] Normal sinus rhythm (07/13 0832) Resp:  [20-51] 20 (07/13 0615) BP: (101-129)/(51-74) 119/68 mmHg (07/13 0615) SpO2:  [94 %-98 %] 98 % (07/13 0615) Weight:  [223 lb 8 oz (101.379 kg)-228 lb 4.8 oz (103.556 kg)] 223 lb 8 oz (101.379 kg) (07/13 0615)  Pre op weight 99.3 kg Current Weight  10/13/14 223 lb 8 oz (101.379 kg)       Intake/Output from previous day: 07/12 0701 - 07/13 0700 In: 483 [P.O.:480; I.V.:3] Out: 2900 [Urine:2900]   Physical Exam:  Cardiovascular: RRR. Pulmonary: Expiratory wheezing Abdomen: Soft, non tender, bowel sounds present. Extremities: Mild LLE lower extremity edema. Motor/sensory intact (left radial artery harvest) Wounds: Clean and dry.  No erythema or signs of infection.  Lab Results: CBC: Recent Labs  10/12/14 0228 10/13/14 0335  WBC 10.4 9.0  HGB 7.9* 8.1*  HCT 23.4* 24.8*  PLT 96* 115*   BMET:  Recent Labs  10/12/14 0228 10/13/14 0335  NA 135 138  K 4.5 4.2  CL 100* 103  CO2 27 25  GLUCOSE 124* 121*  BUN 27* 26*  CREATININE 0.78 0.76  CALCIUM 8.4* 8.6*    PT/INR:  Lab Results  Component Value Date   INR 1.08 10/13/2014   INR 1.11 10/12/2014   INR 1.18 10/11/2014   ABG:  INR: Will add last result for INR, ABG once components are confirmed Will add last 4 CBG results once components are confirmed  Assessment/Plan:  1. CV - ST in the low 100's.. On Atneolol 50 mg  bid, Imdur 30 mg daily and Coumadin (TIA). INR down to 1.08. Will decrease ecasa to 81 as is on Coumadin. 2.  Pulmonary - On room air. CXR shows no pneumothorax, left base atelectasis and small effusion. Encourage incentive spirometer and flutter valve. Xopenex this am for wheezing. 3. Volume Overload - On Lasix 40 mg daily 4.  Acute blood loss anemia - H and H stable at 8.1 and 24.8 5. Thrombocytopenia-platelets up to 115,000. HIT results pending. 6.CBGs 138/117/108. No pre op HGA1C drawn. No prior history of diabetes. Stop accu checks and SS PRN. 7. Remove EPW 8.ID-On cipro 500 mg daily for UTI. Will monitor INR closely as is on Coumadin 9. Possibly home in am  ZIMMERMAN,DONIELLE MPA-C 10/13/2014,8:46 AM  Patient seen and examined, agree with above Strength slowly improving INR slow to bump- increase to 10 mg daily  Remo Lipps C. Roxan Hockey, MD [facility] [phone]

## 2014-10-13 NOTE — Progress Notes (Signed)
Physical Therapy Treatment Patient Details Name: Benjamin Cordova MRN: [id number] DOB: 11-23-1934 Today's Date: 10/13/2014    History of Present Illness Pt is a 80 y/o male with a PMH of CAD, MI, s/p CABG x4 in 1981, HTN, polio, and TIA. He had not had chest pain in many years until the past 6 months. On 7/1 he was awakened by substernal chest pressure associated with diaphoresis and nausea. In the ED ECG showed occasional PVC's. He had a cardiac cath day PTA which revealed severe 3 vessel CAD with all grafts occluded. Pt is now s/p redo CABG on 10/09/14.     PT Comments    Patient's mobility improved, however pt is consistently using his arms for transfers (reports Dr. Roxan Hockey OK'd him using his arms). Continues to fatigue quickly, and has good awareness of when he needs to sit and rest (walked 150 ft with 2 seated rests). Overall, pt feels he will be ready to discharge home 7/14 from a mobility standpoint.    Follow Up Recommendations  Home health PT;Supervision/Assistance - 24 hour     Equipment Recommendations  Other (comment) (lift chair (due to sternal precautions and h/o polio))--per pt Dr. Roxan Hockey to write an order/prescription for him   Recommendations for Other Services       Precautions / Restrictions Precautions Precautions: Fall;Sternal Precaution Comments: pt reports he talked to Dr. Roxan Hockey re: sternal precautions and was told "use your arms as little as you can"; R LE effected by polio, L LE painful from recent injury Restrictions Weight Bearing Restrictions:  (Sternal precautions)    Mobility  Bed Mobility Overal bed mobility: Needs Assistance;+2 for physical assistance Bed Mobility: Sit to Sidelying;Rolling Rolling: Min assist   Supine to sit: Mod assist     General bed mobility comments: HOB flat; pt able to verbalize technique however required assist due to weakness, decr timing  Transfers Overall transfer level: Needs assistance Equipment used:  Rolling walker (2 wheeled) Transfers: Sit to/from Stand Sit to Stand: Mod assist;From elevated surface;Min assist         General transfer comment: from bed and from seat of rollator; pt using UEs to push after conversation with MD; reviewed rationale for precautions with pt verbalizing understanding; pt asked MD for prescription for lift chair and discussed pro's and con's  Ambulation/Gait Ambulation/Gait assistance: Min assist Ambulation Distance (Feet): 50 Feet (x3, seated rests between) Assistive device: 4-wheeled walker Gait Pattern/deviations: Step-through pattern;Decreased stride length;Decreased dorsiflexion - right;Shuffle;Wide base of support;Trunk flexed Gait velocity: Decreased   General Gait Details: becomes more shakey as he fatigues (UEs and LEs); good awareness of fatigue and requests seated rest; reports ambulating only short distances PTA    Stairs Stairs:  (pt deferred; verbalize safe technique he's "used all my life)          Wheelchair Mobility    Modified Rankin (Stroke Patients Only)       Balance     Sitting balance-Leahy Scale: Fair     Standing balance support: Bilateral upper extremity supported Standing balance-Leahy Scale: Poor                      Cognition Arousal/Alertness: Awake/alert Behavior During Therapy: WFL for tasks assessed/performed Overall Cognitive Status: Within Functional Limits for tasks assessed                      Exercises      General Comments  Pertinent Vitals/Pain VSS on room air  Pain Assessment: No/denies pain    Home Living           Entrance Stairs-Rails: Left Home Layout: One level Home Equipment: Walker - 2 wheels;Walker - 4 wheels;Grab bars - tub/shower;Grab bars - toilet (walkin shower; handicap toilet)      Prior Function Level of Independence: Independent          PT Goals (current goals can now be found in the care plan section) Acute Rehab PT  Goals Patient Stated Goal: Return home at d/c Time For Goal Achievement: 10/17/14 Progress towards PT goals: Progressing toward goals    Frequency  Min 3X/week    PT Plan Current plan remains appropriate    Co-evaluation             End of Session Equipment Utilized During Treatment: Gait belt Activity Tolerance: Patient limited by fatigue Patient left: with call bell/phone within reach;in chair     Time: 2671-2458 PT Time Calculation (min) (ACUTE ONLY): 28 min  Charges:  $Gait Training: 23-37 mins                    G Codes:      Rachelle Edwards 10/28/14, 12:19 PM Pager [phone]

## 2014-10-13 NOTE — Progress Notes (Signed)
CARDIAC REHAB PHASE I   PRE:  Rate/Rhythm: 85 SR c/ PVCs  BP:  Sitting: 117/64        SaO2: 98 RA  MODE:  Ambulation: 150 ft   POST:  Rate/Rhythm: 111 ST  BP:  Sitting: 142/79         SaO2: 95 RA  Pt eager to walk. Pt weak to stand, needed reminder of sternal precautions. Pt ambulated 150 ft on RA, assist x 2, rollator, gait belt, mildly unsteady gait, tolerated moderately well. Pt sat to rest x 2 at approximately 50 ft and 100 ft. Pt sat abruptly, weak to stand. Pt c/o DOE (sats 97% on RA in hallway), weakness in legs, denies CP, dizziness. Pt still requiring significant assistance to stand, mobilize, needs frequent reminders of sternal precautions. Pt states he does not have 24hr supervision at home, he helps care for his wife and has children that work full time. Pt would likely benefit from short term SNF at discharge. Pt states he would be agreeable to SNF if appropriate. Pt states he feels he did better today on his walk, states his DOE is improving. Pt to bed after walk per pt request, call bell within reach. Will follow-up tomorrow.    [phone]  Lenna Sciara, RN, BSN 10/13/2014 2:26 PM

## 2014-10-13 NOTE — Progress Notes (Signed)
Patient EPW removed. Patient tolerated well. VSS. Vital signs taken 4 times 15 minutes. Educated on Radio producer. Will continue to monitor.   Domingo Dimes RN

## 2014-10-13 NOTE — Discharge Summary (Signed)
Physician Discharge Summary       [address]       [place], [address]             [phone]    Patient ID: Benjamin Cordova MRN: [id number] DOB/AGE: 78-Jul-1936 78 y.o.  Admit date: 10/01/2014 Discharge date: 10/14/2014  Admission Diagnoses: Unstable angina 1. History of CAD (history of MI) 2. History of essential hypertension 3. History of PVD 4. History of TIA (on chronic Coumadin) 5. History of polio (right leg affected) 6. History of tobacco abuse  Discharge Diagnoses: Unstable angina secondary to native and graft 3 vessel CAD 1. History of CAD (history of MI) 2. History of essential hypertension 3. History of PVD 4. History of TIA (on chronic Coumadin) 5. History of polio (right leg affected) 6. History of tobacco abuse 7. ABL anemia 8. Mild thrombocytopenia  Procedure (s):  Redo median sternotomy, redo coronary bypass grafting x2 (left internal mammary artery to the left anterior descending artery, left radial artery to obtuse marginal 2) by Dr. Roxan Hockey on 10/08/2014.  History of Presenting Illness: This is a 80 yo man with a history of CAD, MI, s/p CABG x 4 in 1981, hypertension, polio, and TIA.   He had not had chest pain in many years until the past 6 months. He has been having some mild chest tightness with exertion that was relieved by rest. On 7/1 he was awakened by substernal chest pressure associated with diaphoresis and nausea. He took Advil and an antacid without any improvement.The pain continued an after an hour he called EMS.   He was brought to the ED. His ECG showed occasional PVCs and his initial troponin was negative. His pain resolved with nitroglycerin. He ruled out for MI with 3 negative troponins. An echo showed an EF of 50% with anteroseptal/ apical akinesis. Yesterday he had cardiac catheterization which revealed severe 3 vessel CAD with all grafts occluded. His EF was 35%.  Brief Hospital Course:  The patient was  extubated late the evening of surgery without difficulty. He remained afebrile and hemodynamically stable. Gordy Councilman, a line, chest tubes, and foley were removed early in the post operative course. He was started on Imdur (left radial artery open harvest). Lopressor was started and titrated accordingly. This was stopped and he was restarted on his Atenolol. He was volume over loaded and diuresed. He had ABL anemia. He did require a post op transfusion as his H and H went down to 7.4 and 21.9. His last H and H was 8.1 and 24.8. He was put on folic acid. He had thrombocytopenia post op. Last platelet count was up to 115,000. HIT was negative. PT consult was obtained as he difficulty with ambulation secondary to polio which has affected his right leg. He was put on Cipro as UA on 7/7 indicated he had UTI. He was weaned off the insulin drip. The patient's glucose remained well controlled. The patient's HGA1C has been drawn and results are still pending . He was restarted on Coumadin (past TIA and on it for years). His PT and INR were monitored daily. Last INR was 1.18. The patient was felt surgically stable for transfer from the ICU to [facility] for further convalescence on 10/12/2014. He continues to progress with cardiac rehab. He/she was ambulating on room air. He has been tolerating a diet and has had a bowel movement. Epicardial pacing wires were removed on 07/13. We will ask the SNF to please remove chest tube  sutures on Monday 10/18/2014. The patient is felt surgically stable for discharge today.  We ask the SNF to please do the following: 1. Please obtain vital signs at least one time daily 2.Please weigh the patient daily. If he or she continues to gain weight or develops lower extremity edema, contact the office at [phone]. 3. Ambulate patient at least three times daily and please use sternal precautions.   Latest Vital Signs: Blood pressure 130/67, pulse 88, temperature 98 F (36.7 C), temperature  source Oral, resp. rate 18, height 5\' 11"  (1.803 m), weight 218 lb 8 oz (99.111 kg), SpO2 99 %.  Physical Exam: Cardiovascular: RRR. Pulmonary: Slightly diminished at bases Abdomen: Soft, non tender, bowel sounds present. Extremities: Mild LLE lower extremity edema. Motor/sensory intact (left radial artery harvest) Wounds: Clean and dry. No erythema or signs of infection.  Discharge Condition:Stable and discharge to SNF.  Recent laboratory studies:  Lab Results  Component Value Date   WBC 9.0 10/13/2014   HGB 8.1* 10/13/2014   HCT 24.8* 10/13/2014   MCV 91.5 10/13/2014   PLT 115* 10/13/2014   Lab Results  Component Value Date   NA 138 10/13/2014   K 4.2 10/13/2014   CL 103 10/13/2014   CO2 25 10/13/2014   CREATININE 0.76 10/13/2014   GLUCOSE 121* 10/13/2014      Diagnostic Studies: Dg Chest 2 View  10/13/2014   CLINICAL DATA:  Status post coronary artery bypass graft x2.  EXAM: CHEST  2 VIEW  COMPARISON:  October 11, 2014.  FINDINGS: Stable cardiomediastinal silhouette. Sternotomy wires are again noted. No pneumothorax is noted. Right lung is clear. Improved left basilar atelectasis is noted. Mild left pleural effusion remains.  IMPRESSION: Improved left basilar opacity consistent with improving atelectasis. Mild left pleural effusion remains which is also improved. No pneumothorax is seen.   Electronically Signed   By: Marijo Conception, M.D.   On: 10/13/2014 07:45      Discharge Medications:   Medication List    STOP taking these medications        ALEVE 220 MG tablet  Generic drug:  naproxen sodium     hydrochlorothiazide 25 MG tablet  Commonly known as:  HYDRODIURIL      TAKE these medications        acidophilus Caps capsule  Take 1 capsule by mouth daily.     aspirin 81 MG EC tablet  Take 1 tablet (81 mg total) by mouth daily.     atenolol 100 MG tablet  Commonly known as:  TENORMIN  Take 0.5 tablets (50 mg total) by mouth 2 (two) times daily.     folic  acid 536 MCG tablet  Commonly known as:  FOLVITE  Take 800 mcg by mouth daily.     furosemide 40 MG tablet  Commonly known as:  LASIX  Take 1 tablet (40 mg total) by mouth daily. For 4 days then stop.     isosorbide mononitrate 30 MG 24 hr tablet  Commonly known as:  IMDUR  Take 1 tablet (30 mg total) by mouth daily. For one month then stop.     lisinopril 2.5 MG tablet  Commonly known as:  PRINIVIL,ZESTRIL  Take 1 tablet (2.5 mg total) by mouth every morning.     MULTIVITAMIN PO  Take 1 tablet by mouth daily.     potassium chloride SA 20 MEQ tablet  Commonly known as:  K-DUR,KLOR-CON  Take 1 tablet (20 mEq total) by mouth  daily. For 4 days then stop.     predniSONE 10 MG tablet  Commonly known as:  DELTASONE  Take 1 tablet (10 mg total) by mouth daily with breakfast.     simvastatin 80 MG tablet  Commonly known as:  ZOCOR  Take 40 mg by mouth at bedtime.     SUPER B-COMPLEX/VIT C/FA Tabs  Take 1 tablet by mouth daily.     traMADol 50 MG tablet  Commonly known as:  ULTRAM  Take 1-2 tablets (50-100 mg total) by mouth every 4 (four) hours as needed for moderate pain.     vitamin C 500 MG tablet  Commonly known as:  ASCORBIC ACID  Take 500 mg by mouth daily.     warfarin 10 MG tablet  Commonly known as:  COUMADIN  Take 10 mg by mouth daily.       The patient has been discharged on:   1.Beta Blocker:  Yes [ x  ]                              No   [   ]                              If No, reason:  2.Ace Inhibitor/ARB: Yes [ x  ]                                     No  [    ]                                     If No, reason:  3.Statin:   Yes [ x  ]                  No  [   ]                  If No, reason:  4.Ecasa:  Yes  [ x  ]                  No   [   ]                  If No, reason:  Follow Up Appointments: Follow-up Information    Follow up with Peter Martinique, MD.   Specialty:  Cardiology   Why:  Call for a follow up appointment for 2 weeks    Contact information:   [place] [address] [phone]       Follow up with Melrose Nakayama, MD On 11/16/2014.   Specialty:  Cardiothoracic Surgery   Why:  PA/LAT CXR to be taken (at [facility] which is in the same building as Dr. Leonarda Salon office) on 11/16/2014 at 11:00 am;Appointment time is at 11:45 am   Contact information:   [place] [address] [phone]       Follow up with SNF.   Why:  Please draw a PT and INR (as is on Coumadin) on Monday 10/18/2014 and call or fax results to Dr. Doug Sou office. INR may be between 2-2.5      Follow up with SNF.  Why:  Please remove chest tube sutures on 10/18/2014      Signed: ZIMMERMAN,DONIELLE MPA-C 10/14/2014, 12:46 PM

## 2014-10-13 NOTE — Progress Notes (Signed)
Utilization review completed.  

## 2014-10-13 NOTE — Progress Notes (Signed)
CSW informed that there was concern for pt home safety due to decrease in mobility- CSW met with pt who is agreeable to SNF search- prefers [facility].  CSW initiated bed search- full assessment to follow.  Domenica Reamer, [place] Social Worker [phone]

## 2014-10-14 ENCOUNTER — Inpatient Hospital Stay
Admission: RE | Admit: 2014-10-14 | Discharge: 2014-11-03 | Disposition: A | Discharge status: Home / Self Care | Payer: Commercial Managed Care - HMO | Source: Ambulatory Visit | Attending: Internal Medicine | Admitting: Internal Medicine

## 2014-10-14 DIAGNOSIS — M6281 Muscle weakness (generalized): Secondary | ICD-10-CM | POA: Diagnosis not present

## 2014-10-14 DIAGNOSIS — D696 Thrombocytopenia, unspecified: Secondary | ICD-10-CM | POA: Diagnosis not present

## 2014-10-14 DIAGNOSIS — J90 Pleural effusion, not elsewhere classified: Principal | ICD-10-CM

## 2014-10-14 DIAGNOSIS — Z7901 Long term (current) use of anticoagulants: Secondary | ICD-10-CM | POA: Diagnosis not present

## 2014-10-14 DIAGNOSIS — E119 Type 2 diabetes mellitus without complications: Secondary | ICD-10-CM | POA: Diagnosis not present

## 2014-10-14 DIAGNOSIS — I509 Heart failure, unspecified: Secondary | ICD-10-CM | POA: Diagnosis not present

## 2014-10-14 DIAGNOSIS — I480 Paroxysmal atrial fibrillation: Secondary | ICD-10-CM | POA: Diagnosis not present

## 2014-10-14 DIAGNOSIS — R278 Other lack of coordination: Secondary | ICD-10-CM | POA: Diagnosis not present

## 2014-10-14 DIAGNOSIS — I739 Peripheral vascular disease, unspecified: Secondary | ICD-10-CM | POA: Diagnosis not present

## 2014-10-14 DIAGNOSIS — M89661 Osteopathy after poliomyelitis, right lower leg: Secondary | ICD-10-CM | POA: Diagnosis not present

## 2014-10-14 DIAGNOSIS — M1990 Unspecified osteoarthritis, unspecified site: Secondary | ICD-10-CM | POA: Diagnosis not present

## 2014-10-14 DIAGNOSIS — Z951 Presence of aortocoronary bypass graft: Secondary | ICD-10-CM | POA: Diagnosis not present

## 2014-10-14 DIAGNOSIS — R262 Difficulty in walking, not elsewhere classified: Secondary | ICD-10-CM | POA: Diagnosis not present

## 2014-10-14 DIAGNOSIS — R269 Unspecified abnormalities of gait and mobility: Secondary | ICD-10-CM | POA: Diagnosis not present

## 2014-10-14 DIAGNOSIS — I10 Essential (primary) hypertension: Secondary | ICD-10-CM | POA: Diagnosis not present

## 2014-10-14 DIAGNOSIS — Z5181 Encounter for therapeutic drug level monitoring: Secondary | ICD-10-CM | POA: Diagnosis not present

## 2014-10-14 DIAGNOSIS — B91 Sequelae of poliomyelitis: Secondary | ICD-10-CM | POA: Diagnosis not present

## 2014-10-14 DIAGNOSIS — N390 Urinary tract infection, site not specified: Secondary | ICD-10-CM | POA: Diagnosis not present

## 2014-10-14 LAB — HEMOGLOBIN A1C
HEMOGLOBIN A1C: 5.4 % (ref 4.8–5.6)
MEAN PLASMA GLUCOSE: 108 mg/dL

## 2014-10-14 LAB — PROTIME-INR
INR: 1.18 (ref 0.00–1.49)
PROTHROMBIN TIME: 15.1 s (ref 11.6–15.2)

## 2014-10-14 MED ORDER — CIPROFLOXACIN HCL 500 MG PO TABS: 500.0000 mg | ORAL_TABLET | Freq: Every day | ORAL | Status: DC

## 2014-10-14 MED ORDER — LISINOPRIL 2.5 MG PO TABS
2.5000 mg | ORAL_TABLET | Freq: Every day | ORAL | Status: DC
  Administered 2014-10-14: 2.5 mg via ORAL
  Filled 2014-10-14: qty 1

## 2014-10-14 MED ORDER — ATENOLOL 100 MG PO TABS: 50.0000 mg | ORAL_TABLET | Freq: Two times a day (BID) | ORAL | Status: DC

## 2014-10-14 MED ORDER — ASPIRIN 81 MG PO TBEC: 81.0000 mg | DELAYED_RELEASE_TABLET | Freq: Every day | ORAL | Status: DC

## 2014-10-14 MED ORDER — PREDNISONE 10 MG PO TABS: 10.0000 mg | ORAL_TABLET | Freq: Two times a day (BID) | ORAL | Status: DC

## 2014-10-14 MED ORDER — FUROSEMIDE 40 MG PO TABS: 40.0000 mg | ORAL_TABLET | Freq: Every day | ORAL | Status: DC

## 2014-10-14 MED ORDER — TRAMADOL HCL 50 MG PO TABS: 50.0000 mg | ORAL_TABLET | ORAL | Status: DC | PRN

## 2014-10-14 MED ORDER — PREDNISONE 10 MG PO TABS: 10.0000 mg | ORAL_TABLET | Freq: Every day | ORAL | Status: DC

## 2014-10-14 MED ORDER — LISINOPRIL 2.5 MG PO TABS: 2.5000 mg | ORAL_TABLET | Freq: Every morning | ORAL | Status: DC

## 2014-10-14 MED ORDER — POTASSIUM CHLORIDE CRYS ER 20 MEQ PO TBCR: 20.0000 meq | EXTENDED_RELEASE_TABLET | Freq: Every day | ORAL | Status: DC

## 2014-10-14 MED ORDER — ISOSORBIDE MONONITRATE ER 30 MG PO TB24: 30.0000 mg | ORAL_TABLET | Freq: Every day | ORAL | Status: DC

## 2014-10-14 NOTE — Clinical Social Work Note (Signed)
Clinical Social Work Assessment  Patient Details  Name: Benjamin Cordova MRN: [id number] Date of Birth: 1934/10/09  Date of referral:  10/13/14               Reason for consult:  Facility Placement                Permission sought to share information with:  Family Supports, Chartered certified accountant granted to share information::  Yes, Verbal Permission Granted  Name::     Games developer::  [place] [facility]  Relationship::  spouse  Contact Information:     Housing/Transportation Living arrangements for the past 2 months:  Single Family Home Source of Information:  Patient Patient Interpreter Needed:  None Criminal Activity/Legal Involvement Pertinent to Current Situation/Hospitalization:  No - Comment as needed Significant Relationships:  Spouse Lives with:  Spouse Do you feel safe going back to the place where you live?  Yes Need for family participation in patient care:  No (Coment)  Care giving concerns:  Patient live at home with wife who pt states can take care of herself but is incapable of offering physical Regulatory affairs officer / plan:  CSW spoke with pt about physician concern for patient returning home without 24 hour assistance  Employment status:  Retired Forensic scientist:  Managed Care PT Recommendations:  Home with [place], [place] / Referral to community resources:  [place]  Patient/Family's Response to care:  Pt is agreeable to SNF placement and hopeful for [facility]  Patient/Family's Understanding of and Emotional Response to Diagnosis, Current Treatment, and Prognosis:  Pt is realistic about current medical condition and did not express concerns or questions about current medical condition/prognosis  Emotional Assessment Appearance:  Appears stated age Attitude/Demeanor/Rapport:    Affect (typically observed):  Appropriate, Pleasant Orientation:  Oriented to  Self, Oriented to Place, Oriented to  Time, Oriented to Situation Alcohol / Substance use:  Not Applicable Psych involvement (Current and /or in the community):  No (Comment)  Discharge Needs  Concerns to be addressed:  Care Coordination Readmission within the last 30 days:  No Current discharge risk:  Physical Impairment Barriers to Discharge:  Continued Medical Work up   [employer], LCSW 10/14/2014, 8:52 AM

## 2014-10-14 NOTE — Clinical Social Work Placement (Signed)
   CLINICAL SOCIAL WORK PLACEMENT  NOTE  Date:  10/14/2014  Patient Details  Name: Benjamin Cordova MRN: [id number] Date of Birth: Jul 14, 1934  Clinical Social Work is seeking post-discharge placement for this patient at the [place] level of care (*CSW will initial, date and re-position this form in  chart as items are completed):  Yes   Patient/family provided with [place] Work Department's list of facilities offering this level of care within the geographic area requested by the patient (or if unable, by the patient's family).  Yes   Patient/family informed of their freedom to choose among providers that offer the needed level of care, that participate in Medicare, Medicaid or managed care program needed by the patient, have an available bed and are willing to accept the patient.  Yes   Patient/family informed of [facility] ownership interest in [facility] and [facility], as well as of the fact that they are under no obligation to receive care at these facilities.  PASRR submitted to EDS on 10/13/14     PASRR number received on 10/13/14     Existing PASRR number confirmed on       FL2 transmitted to all facilities in geographic area requested by pt/family on 10/13/14     FL2 transmitted to all facilities within larger geographic area on 10/14/14     Patient informed that his/her managed care company has contracts with or will negotiate with certain facilities, including the following:        Yes   Patient/family informed of bed offers received.  Patient chooses bed at [facility]     Physician recommends and patient chooses bed at      Patient to be transferred to [facility] on 10/14/14.  Patient to be transferred to facility by family     Patient family notified on 10/14/14 of transfer.  Name of family member notified:  pt to notify     PHYSICIAN Please sign FL2     Additional Comment:     _______________________________________________ Cranford Mon, LCSW 10/14/2014, 1:14 PM

## 2014-10-14 NOTE — Progress Notes (Signed)
10/14/2014 2:35 PM Report called to [facility].  Pt's daughter here to take pt to facility.  Packet given to daughter to give to facility.  Pt. Discharged to [facility] via private vehicle. [place]

## 2014-10-14 NOTE — Progress Notes (Addendum)
[facility] has a bed today and is able to accept.  Received insurance auth  Patient will discharge to [facility] Anticipated discharge date:10/14/14 Family notified:pt to notify Transportation by family- arrive around 2:15pm to transport pt  CSW signing off.  Domenica Reamer, [place] Social Worker [phone]

## 2014-10-14 NOTE — Care Management Important Message (Signed)
Important Message  Patient Details  Name: Benjamin Cordova MRN: [id number] Date of Birth: 1934-04-11   Medicare Important Message Given:  Yes-fourth notification given    Pricilla Handler 10/14/2014, 2:09 PM

## 2014-10-14 NOTE — Progress Notes (Signed)
Physical Therapy Note  Notified by Case Manager that pt does NOT have 24 hour assistance at home (as evaluating PT understood). In light of this development, agree pt can benefit from SNF for continued therapies, especially to increase his lower extremity strength to allow adherence to sternal precautions during transfers.   10/14/14 0905  PT Visit Information  Last PT Received On 10/14/14  PT - Assessment/Plan  PT Plan Discharge plan needs to be updated  PT Frequency (ACUTE ONLY) Min 3X/week  Follow Up Recommendations SNF;Supervision/Assistance - 24 hour  PT equipment Other (comment) (lift chair (due to sternal precautions and h/o polio))    10/14/2014 Barry Brunner, PT Pager: [phone]

## 2014-10-14 NOTE — Progress Notes (Addendum)
      [address]       [place] [address]             [phone]        6 Days Post-Op Procedure(s) (LRB): REDO CORONARY ARTERY BYPASS GRAFTING (CABG) Times Two Grafts using Left Internal Mammary and Left Radial Artery (N/A) TRANSESOPHAGEAL ECHOCARDIOGRAM (TEE) (N/A) Left RADIAL ARTERY HARVEST (Left)  Subjective: Patient receiving breathing treatment this am. He has no complaints.  Objective: Vital signs in last 24 hours: Temp:  [98 F (36.7 C)-99.1 F (37.3 C)] 98 F (36.7 C) (07/14 0455) Pulse Rate:  [85-93] 88 (07/14 0455) Cardiac Rhythm:  [-] Normal sinus rhythm (07/13 2100) Resp:  [18] 18 (07/14 0455) BP: (106-130)/(56-75) 130/75 mmHg (07/14 0455) SpO2:  [95 %-98 %] 98 % (07/14 0455) Weight:  [218 lb 8 oz (99.111 kg)] 218 lb 8 oz (99.111 kg) (07/14 0455)  Pre op weight 99.3 kg Current Weight  10/14/14 218 lb 8 oz (99.111 kg)       Intake/Output from previous day: 07/13 0701 - 07/14 0700 In: 12 [P.O.:970] Out: 2550 [Urine:2550]   Physical Exam:  Cardiovascular: RRR. Pulmonary: Slightly diminished at bases Abdomen: Soft, non tender, bowel sounds present. Extremities: SCDs in place Wounds: Clean and dry.  No erythema or signs of infection.  Lab Results: CBC:  Recent Labs  10/12/14 0228 10/13/14 0335  WBC 10.4 9.0  HGB 7.9* 8.1*  HCT 23.4* 24.8*  PLT 96* 115*   BMET:   Recent Labs  10/12/14 0228 10/13/14 0335  NA 135 138  K 4.5 4.2  CL 100* 103  CO2 27 25  GLUCOSE 124* 121*  BUN 27* 26*  CREATININE 0.78 0.76  CALCIUM 8.4* 8.6*    PT/INR:  Lab Results  Component Value Date   INR 1.18 10/14/2014   INR 1.08 10/13/2014   INR 1.11 10/12/2014   ABG:  INR: Will add last result for INR, ABG once components are confirmed Will add last 4 CBG results once components are confirmed  Assessment/Plan:  1. CV - SR in the 80's. On Atneolol 50 mg bid, Imdur 30 mg daily, and Coumadin 10 mg daily (TIA). INR slightly  increased to 1.18. Will restart low dose Lisinopril. 2.  Pulmonary - On room air. CXR shows no pneumothorax, left base atelectasis and small effusion. Encourage incentive spirometer and flutter valve. Xopenex this am for wheezing. 3. Volume Overload - On Lasix 40 mg daily 4.  Acute blood loss anemia - H and H stable at 8.1 and 24.8 5. Thrombocytopenia-platelets up to 115,000. HIT results pending. 6.CBGs 138/117/108. No pre op HGA1C drawn. No prior history of diabetes. Stop accu checks and SS PRN. 7.ID-On cipro 500 mg daily for UTI. Will monitor INR closely as is on Coumadin 8. Deconditioning-slowly improving 9. On Prednisone 10mg  bid for knee pain. Will discuss with Dr. Roxan Hockey if to continue at discharge (was on 30 mg daily) 10. Per social work, patient agreeable to SNF. Will discuss with Dr. Roxan Hockey if ready today or in am.  ZIMMERMAN,DONIELLE MPA-C 10/14/2014,7:37 AM   Patient seen and examined, agree with above I think he is ready for dc to SNF, apparently [facility] has a bed available  Remo Lipps C. Roxan Hockey, MD [facility] [phone]

## 2014-10-14 NOTE — Progress Notes (Signed)
CARDIAC REHAB PHASE I   PRE:  Rate/Rhythm: 95 SR PVCs  BP:  Supine: 124/68  Sitting:   Standing:    SaO2: 97%RA  MODE:  Ambulation: 150 ft   POST:  Rate/Rhythm: 121 ST  BP:  Supine:   Sitting: 155/60  Standing:    SaO2: 97%RA 1035-1110 Pt walked 150 ft on RA with rollator, gait belt use and asst x 2. Sat once to rest. To recliner after walk. Pt had been incontinent of urine prior to walk so we helped pt get cleaned and changed gowns prior to walk. Education completed with pt. Discussed sternal precautions, IS, flutter valve, and heart healthy food choices. Discussed CRP 2 and pt declined. Pt uses scooter at grocery store and has been limited in his mobility prior to admission. Encouraged pt to walk as tolerated with assistance.    Graylon Good, RN BSN  10/14/2014 11:07 AM

## 2014-10-14 NOTE — Clinical Social Work Placement (Signed)
   CLINICAL SOCIAL WORK PLACEMENT  NOTE  Date:  10/14/2014  Patient Details  Name: Benjamin Cordova MRN: [id number] Date of Birth: [birth year]  Clinical Social Work is seeking post-discharge placement for this patient at the [place] level of care (*CSW will initial, date and re-position this form in  chart as items are completed):  Yes   Patient/family provided with [place] Work Department's list of facilities offering this level of care within the geographic area requested by the patient (or if unable, by the patient's family).  Yes   Patient/family informed of their freedom to choose among providers that offer the needed level of care, that participate in Medicare, Medicaid or managed care program needed by the patient, have an available bed and are willing to accept the patient.  Yes   Patient/family informed of [facility]'s ownership interest in [facility] and [facility], as well as of the fact that they are under no obligation to receive care at these facilities.  PASRR submitted to EDS on 10/13/14     PASRR number received on 10/13/14     Existing PASRR number confirmed on       FL2 transmitted to all facilities in geographic area requested by pt/family on 10/13/14     FL2 transmitted to all facilities within larger geographic area on       Patient informed that his/her managed care company has contracts with or will negotiate with certain facilities, including the following:            Patient/family informed of bed offers received.  Patient chooses bed at       Physician recommends and patient chooses bed at      Patient to be transferred to   on  .  Patient to be transferred to facility by       Patient family notified on   of transfer.  Name of family member notified:        PHYSICIAN Please sign FL2     Additional Comment:    _______________________________________________ Cranford Mon, LCSW 10/14/2014,  8:55 AM

## 2014-10-15 ENCOUNTER — Encounter: Payer: Self-pay | Admitting: Internal Medicine

## 2014-10-15 ENCOUNTER — Non-Acute Institutional Stay: Payer: Commercial Managed Care - HMO | Admitting: Internal Medicine

## 2014-10-15 ENCOUNTER — Encounter (HOSPITAL_COMMUNITY)
Admission: AD | Admit: 2014-10-15 | Discharge: 2014-10-15 | Disposition: A | Discharge status: Home / Self Care | Payer: Commercial Managed Care - HMO | Source: Skilled Nursing Facility | Attending: Internal Medicine | Admitting: Internal Medicine

## 2014-10-15 DIAGNOSIS — I10 Essential (primary) hypertension: Secondary | ICD-10-CM | POA: Diagnosis not present

## 2014-10-15 DIAGNOSIS — Z951 Presence of aortocoronary bypass graft: Secondary | ICD-10-CM

## 2014-10-15 DIAGNOSIS — D696 Thrombocytopenia, unspecified: Secondary | ICD-10-CM | POA: Diagnosis not present

## 2014-10-15 DIAGNOSIS — I509 Heart failure, unspecified: Secondary | ICD-10-CM

## 2014-10-15 LAB — CBC
HEMATOCRIT: 25.5 % — AB (ref 39.0–52.0)
Hemoglobin: 8.3 g/dL — ABNORMAL LOW (ref 13.0–17.0)
MCH: 30.6 pg (ref 26.0–34.0)
MCHC: 32.5 g/dL (ref 30.0–36.0)
MCV: 94.1 fL (ref 78.0–100.0)
Platelets: 166 10*3/uL (ref 150–400)
RBC: 2.71 MIL/uL — AB (ref 4.22–5.81)
RDW: 15 % (ref 11.5–15.5)
WBC: 6.8 10*3/uL (ref 4.0–10.5)

## 2014-10-15 LAB — BASIC METABOLIC PANEL
Anion gap: 6 (ref 5–15)
BUN: 34 mg/dL — ABNORMAL HIGH (ref 6–20)
CHLORIDE: 106 mmol/L (ref 101–111)
CO2: 27 mmol/L (ref 22–32)
Calcium: 8.7 mg/dL — ABNORMAL LOW (ref 8.9–10.3)
Creatinine, Ser: 0.83 mg/dL (ref 0.61–1.24)
GFR calc Af Amer: >60 mL/min (ref >60)
GFR calc non Af Amer: >60 mL/min (ref >60)
Glucose, Bld: 93 mg/dL (ref 65–99)
Potassium: 4.1 mmol/L (ref 3.5–5.1)
Sodium: 139 mmol/L (ref 135–145)

## 2014-10-15 LAB — PROTIME-INR
INR: 1.29 (ref 0.00–1.49)
Prothrombin Time: 16.2 seconds — ABNORMAL HIGH (ref 11.6–15.2)

## 2014-10-15 NOTE — Progress Notes (Signed)
Patient ID: Benjamin Cordova, male   DOB: 04/04/1934, 80 y.o.   MRN: [id number]   This is an acute visit.  Level care skilled.  Facility [facility].  She'll complaint acute visit status post hospitalization for chest pain resulting in CABG.  History of present illness.  Patient is a pleasant 80 year old male with a history of coronary artery disease and MI status post CABG times 07/20/1979 hypertension polio and TIA.  He had increasing chest pain with diaphoresis and nausea his troponins were negative 3 in the hospital cataract catheterization did reveal severe 3 vessel coronary artery disease with graft occlusion ejection fraction 35%.  He did receive his CABG with redoing his coronary bypass grafting 2  .  Apparently he tolerated the procedure well.  He was started on Imdur as well as Lopressor this was eventually switched to atenolol.  Postop he was volume overloaded and diuresed.  He also required a transfusion for postop anemia 7.4 this has risen as of today up to 8.3.   he also has mild thrombocytopenia but this appears to have resolved as well 166,000 on lab done today.  Patient did have some ambulation difficulties he does have a history of polio which has affected his right leg.  Patient also was treated with ciprofloxacin for suspected UTI.  Patient's Coumadin eventually was restarted he was on this apparently for a history of TIAs it is subtherapeutic today at 1.29 but is slowly rising he is on 10 mg.  Today he has no acute complaints--he is using spirometry and feels he is benefiting from this he also would like some nebulizers he said he receive these in the hospital and it helped with his breathing-he does not complain of any increased shortness of breath today but feels nebulizers would benefit him.  Previous medical history.  History of coronary artery disease status post CABG as noted above result CABG in 1981.  Hypertension.  Peripheral vascular  disease.  History of TIA on chronic Coumadin.  Pulse 3 of polio which affects the right leg.  History of tobacco abuse.  Postop anemia.  Mild thrombocytopenia.  History of CHF ejection fraction 35%.  Surgical history.  Previous history of tonsillectomy-foot surgery-CABGs as noted above.  Medications.  Aspirin 81 mg enteric-coated daily.  Atenolol 50 mg twice a day.  Folic acid 174 g daily.  Lasix 40 mg daily for 4 additional days.  Imdur 30 mg daily for one month.  Lisinopril 2.5 mg every morning.  Multivitamin daily.  Potassium 20 mEq daily for 4 days.  Prednisone 10 mg daily.  Simvastatin 40 mg daily at bedtime.  Tramadol 1 or 2 tabs 50 mg tab every 4 hours when necessary.  Vitamin C 500 mg daily.  Coumadin 10 mg daily.  Social history.  Patient quit smoking 35 years ago has a 28-pack-year smoking history Also has a history of snuff use in the past.  Family history reviewed per admission H&P on 10/01/2014.  Review of systems.  General does not complain of any fever or chills.  Skin does not complain of rashes or itching surgical sites appear to be unremarkable.  Head ears eyes nose mouth and throat-does not complain of any visual changes sore throat or nasal discharge.  Respiratory does not complain of shortness breath or cough does feel he would benefit from nebulizers however.  Cardiac does not complain of chest pain palpitations or significant lower extremity edema.  GU does not complain of dysuria.  Musculoskeletal has some weakness  lower extremities but does not complain of acute weakness or pain he does have some weakness right leg status post polio.  Neurologic again deficits right leg weakness status post polio does not complain of numbness headache or syncopal-type feelings.  Psych does not complain of depression or anxiety.  Physical exam.  Temperature 98.1 pulse 90 respirations 18 blood pressure 100/47-121/64 weight is  219.  In general this is a very pleasant elderly male in no distress resting comfortably in bed.  His skin is warm and dry.  He does have what appears to be a well-healing mid chest scar there is crusting I do not see any erythema drainage or sign of infection.  He also has a surgical scar left low arm which appears similarly unremarkable.  He does have violaceous bruising of his left groin and arms bilaterally.  Eyes he does have prescription lenses pupils appear reactive light visual acuity appears grossly intact sclera and conjunctivae are clear.  Oropharynx clear mucous membranes moist.  Chest clear to auscultation there is no labored breathing.  It is regular rate and rhythm without murmur gallop or rub he does not have significant lower extremity edema pedal pulses appear to be intact.  Abdomen soft nontender positive bowel sounds.  GU again he does have bruising in the left groin area otherwise unremarkable.  Muscle skeletal has some weakness lower extremities right greater than left again he does have a history of polio I did not ambulate him apparently he is ambulating a wheelchair has significant lower extremity weakness I did not note any deformities upper extremities strength appears to be preserved bilaterally.  Neurologic again does have the right leg weakness otherwise neurologically appears to be intact cranial nerves intact to speech is clear.  Psych he is alert and oriented pleasant and appropriate.  Labs.  10/15/2014.  Sodium 132 potassium 4.1 BUN 34 creatinine 0.83.  INR 1.29.  WBC 6.8 hemoglobin 8.3 platelets 166.  10/13/2014.  Hemoglobin A1c-5.6.  Assessment plan.  #1 status post CABG redo 2 with history coronary artery disease chest pain this appears to have stabilized he tolerated procedure well he did have postop anemia and required transfusion hemoglobin is trending up will need to recheck CBC on Monday, July 18.  In regards to coronary  artery disease he continues on aspirin-also on Coumadin with previous history of TIAs.  Is on a beta blocker as well as low dose ACE inhibitor also on Imdur-clinically appears to be stable   #2 history CHF with ejection fraction of 35% he is on Lasix 40 mg a day as well as potassium supplementation again is on an Ace as well as a beta blocker and statin-he will need daily weights clinically he appears to be compensated at this time.  #3 history of hypertension at this point will monitor recent blood pressures 121/64-100/47 he is on atenolol 50 mg twice a day as well as lisinopril 2.5 mg a day.  #4-history of TIAs again Coumadin has been restarted INR is 1.29 this is actually trending up on 10 mg a day continue this dose and recheck an INR tomorrow.  #5-history of thrombocytopenia is status post transfusion which may have artificially increase his platelet count 166,000 hemoglobin has trended up at 8.3 Will recheck this next Monday.  Of note Will order dou-nebs per patient request as that does make him feel more comfortable with these breathing treatments-also will update a metabolic panel on Monday, July 18.  [id number] note greater than 45 minutes spent  assessing patient reviewing his chart and coordinating and formulating a plan of care for numerous diagnoses-of note greater than 50% of time spent coordinating plan of care    No significant history of alcohol or illicit drug use.

## 2014-10-16 LAB — PROTIME-INR
INR: 1.33 (ref 0.00–1.49)
PROTHROMBIN TIME: 16.6 s — AB (ref 11.6–15.2)

## 2014-10-17 ENCOUNTER — Non-Acute Institutional Stay (SKILLED_NURSING_FACILITY): Payer: Commercial Managed Care - HMO | Admitting: Internal Medicine

## 2014-10-17 DIAGNOSIS — Z951 Presence of aortocoronary bypass graft: Secondary | ICD-10-CM

## 2014-10-17 DIAGNOSIS — Z5181 Encounter for therapeutic drug level monitoring: Secondary | ICD-10-CM | POA: Diagnosis not present

## 2014-10-17 DIAGNOSIS — R269 Unspecified abnormalities of gait and mobility: Secondary | ICD-10-CM

## 2014-10-17 DIAGNOSIS — M6281 Muscle weakness (generalized): Secondary | ICD-10-CM

## 2014-10-17 DIAGNOSIS — I10 Essential (primary) hypertension: Secondary | ICD-10-CM | POA: Diagnosis not present

## 2014-10-17 DIAGNOSIS — B91 Sequelae of poliomyelitis: Secondary | ICD-10-CM | POA: Diagnosis not present

## 2014-10-17 DIAGNOSIS — I509 Heart failure, unspecified: Secondary | ICD-10-CM | POA: Diagnosis not present

## 2014-10-17 DIAGNOSIS — Z7901 Long term (current) use of anticoagulants: Secondary | ICD-10-CM | POA: Diagnosis not present

## 2014-10-17 NOTE — Progress Notes (Signed)
Patient ID: Benjamin Cordova, male   DOB: 07/10/1934, 80 y.o.   MRN: [id number]  Facility; [facility] Chief complaint; admission to SNF post admit to [facility] from 7/1 to 7/14  History; this is a 80 year old man who has a history of coronary artery disease with a CABG done in 1981. He had not had chest pain in many years although started to note some minced the last 6 months. On the day of admission he was awakened by substernal chest pain associated with diaphoresis and nausea ruled out for an MI. His cardiac catheterization revealed severe three-vessel coronary artery disease. His ejection fraction was 35% a cath. The patient proceeded to have a redo CABG. Postoperative issues included the low platelet counts with a negative hit panel. Patient is been on Coumadin for years and this was restarted brackets history of a TIA]. It was noted that he had gait ataxia.  Past Medical History  Diagnosis Date  . Coronary artery disease     Multivessel s/p CABG 1981 at [address] [facility]  . Essential hypertension   . Myocardial infarction 1980  . Arthritis   . Hematuria   . Bladder infection, acute   . Peripheral vascular disease   . Polio osteopathy of lower leg Age 56    Affected right leg  . TIA (transient ischemic attack)     Chronic coumadin    Past Surgical History  Procedure Laterality Date  . Tonsillectomy  age 69    [facility]  . Foot surgery  1954    [facility], muscle implantation  . Coronary artery bypass graft  1981    [facility]  . Cystoscopy with injection  10/10/2010    Procedure: CYSTOSCOPY WITH INJECTION;  Surgeon: Marissa Nestle;  Location: [facility];  Service: Urology;  Laterality: N/A;  with retrograde urethrogram  . Cardiac catheterization N/A 10/05/2014    Procedure: Left Heart Cath and Coronary Angiography;  Surgeon: Peter M Martinique, MD;  Location: [facility];  Service: Cardiovascular;  Laterality: N/A;  . Coronary artery bypass graft N/A 10/08/2014    Procedure: REDO CORONARY ARTERY BYPASS  GRAFTING (CABG) Times Two Grafts using Left Internal Mammary and Left Radial Artery;  Surgeon: Melrose Nakayama, MD;  Location: [facility];  Service: Open Heart Surgery;  Laterality: N/A;  . Tee without cardioversion N/A 10/08/2014    Procedure: TRANSESOPHAGEAL ECHOCARDIOGRAM (TEE);  Surgeon: Melrose Nakayama, MD;  Location: [facility];  Service: Open Heart Surgery;  Laterality: N/A;  . Radial artery harvest Left 10/08/2014    Procedure: Left RADIAL ARTERY HARVEST;  Surgeon: Melrose Nakayama, MD;  Location: [facility];  Service: Open Heart Surgery;  Laterality: Left;    Current Outpatient Prescriptions on File Prior to Visit  Medication Sig Dispense Refill  . acidophilus (RISAQUAD) CAPS capsule Take 1 capsule by mouth daily.    Marland Kitchen aspirin EC 81 MG EC tablet Take 1 tablet (81 mg total) by mouth daily.    Marland Kitchen atenolol (TENORMIN) 100 MG tablet Take 0.5 tablets (50 mg total) by mouth 2 (two) times daily.    . B Complex-C-Folic Acid (SUPER B-COMPLEX/VIT C/FA) TABS Take 1 tablet by mouth daily.      . folic acid (FOLVITE) 010 MCG tablet Take 800 mcg by mouth daily.     . furosemide (LASIX) 40 MG tablet Take 1 tablet (40 mg total) by mouth daily. For 4 days then stop. 30 tablet 0  . isosorbide mononitrate (IMDUR) 30 MG 24 hr tablet  Take 1 tablet (30 mg total) by mouth daily. For one month then stop.    Marland Kitchen lisinopril (PRINIVIL,ZESTRIL) 2.5 MG tablet Take 1 tablet (2.5 mg total) by mouth every morning.    . Multiple Vitamin (MULTIVITAMIN PO) Take 1 tablet by mouth daily.      . potassium chloride SA (K-DUR,KLOR-CON) 20 MEQ tablet Take 1 tablet (20 mEq total) by mouth daily. For 4 days then stop.    . predniSONE (DELTASONE) 10 MG tablet Take 1 tablet (10 mg total) by mouth daily with breakfast.    . simvastatin (ZOCOR) 80 MG tablet Take 40 mg by mouth at bedtime.    . traMADol (ULTRAM) 50 MG tablet Take 1-2 tablets (50-100 mg total) by mouth every 4 (four) hours as needed for moderate pain. 30 tablet 0  . vitamin C  (ASCORBIC ACID) 500 MG tablet Take 500 mg by mouth daily.      Marland Kitchen warfarin (COUMADIN) 10 MG tablet Take 10 mg by mouth daily.      Social; the patient states he is active and independent home. In fact he was playing golf and bowling up until several months ago.  reports that he quit smoking about 35 years ago. His smoking use included Cigarettes. He has a 28 pack-year smoking history. His smokeless tobacco use includes Snuff. He reports that he does not drink alcohol or use illicit drugs.  family history is negative for Anesthesia problems, Hypotension, Malignant hyperthermia, and Pseudochol deficiency.  Review of systems Respiratory; is not complaining of shortness of breath Cardiac no exertional chest discomfort although he is still having some discomfort from the operative site. GI constipation GU no dysuria Musculoskeletal weakness from polio  Physical examination  Gen. patient does not look to be in any distress. Respiratory exam is clear Cardiac heart sounds are normal no murmurs no gallops Abdomen; chest tube sutures are still in place. GU no suprapubic tenderness. Extremities no edema and no evidence of a DVT. Neurologic he has wasting of the musculature of his right leg. Bilateral hypo-reflexia. Celexa Zovia significant ostial of the left knee Gait; he is able to bring himself to a standing position really unsteady.  Impression/plan #1 coronary artery disease status post redo CABG should. He seems to have done well here. No Evidence of CHF. His EF at time of cardiac cath was listed at 35%. Hopefully this will improve. He is on Lasix 40 mg a day for 4 days then stop, Imdur 30 daily, lisinopril 2.5 daily atenolol 50 twice a day #2 gait ataxia secondary to disuse and/or polio in the right leg. This was more significant than I was expecting. #3 his hemoglobin A1c which was pending at time of discharge was 5.4. There is no evidence of diabetes. #4 on prednisone for reasons that are  not really clear. See if I can clarify that with the patient. #5 hyperlipidemia on Zocor 40 #6 back on Coumadin 10 mg with an INR yesterday of 1.33 this will need to be checked tomorrow. His baseline Coumadin dose is 10 mg #7 postoperative anemia his last hemoglobin was 8.3 although that is up. I'll recheck this down the road. #8 he has been on prednisone for what sounds like osteoarthritis plus or minus an injury he had after a fall in January in the snow. He suddenly tells me he had another fall with what sounds like a hematoma in the right leg. He states he has been on continuous prednisone for at least 6 weeks. He  thinks that this can be stopped and he was trying to taper it as an outpatient. I don't think that this would be safe. I'll begin a taper on him. There is certainly no evidence of an active arthritis #5 hypertension this will need to be carefully monitored.

## 2014-10-18 LAB — CBC
HCT: 26.7 % — ABNORMAL LOW (ref 39.0–52.0)
HEMOGLOBIN: 8.6 g/dL — AB (ref 13.0–17.0)
MCH: 30.4 pg (ref 26.0–34.0)
MCHC: 32.2 g/dL (ref 30.0–36.0)
MCV: 94.3 fL (ref 78.0–100.0)
Platelets: 221 10*3/uL (ref 150–400)
RBC: 2.83 MIL/uL — ABNORMAL LOW (ref 4.22–5.81)
RDW: 15.2 % (ref 11.5–15.5)
WBC: 8.8 10*3/uL (ref 4.0–10.5)

## 2014-10-18 LAB — PROTIME-INR
INR: 1.71 — AB (ref 0.00–1.49)
Prothrombin Time: 20 seconds — ABNORMAL HIGH (ref 11.6–15.2)

## 2014-10-18 LAB — BASIC METABOLIC PANEL
Anion gap: 7 (ref 5–15)
BUN: 23 mg/dL — ABNORMAL HIGH (ref 6–20)
CO2: 28 mmol/L (ref 22–32)
CREATININE: 0.75 mg/dL (ref 0.61–1.24)
Calcium: 8.4 mg/dL — ABNORMAL LOW (ref 8.9–10.3)
Chloride: 100 mmol/L — ABNORMAL LOW (ref 101–111)
GFR calc Af Amer: >60 mL/min (ref >60)
GFR calc non Af Amer: >60 mL/min (ref >60)
Glucose, Bld: 96 mg/dL (ref 65–99)
Potassium: 4 mmol/L (ref 3.5–5.1)
SODIUM: 135 mmol/L (ref 135–145)

## 2014-10-20 LAB — PROTIME-INR
INR: 2.28 — ABNORMAL HIGH (ref 0.00–1.49)
PROTHROMBIN TIME: 24.9 s — AB (ref 11.6–15.2)

## 2014-10-21 LAB — PROTIME-INR
INR: 2.34 — ABNORMAL HIGH (ref 0.00–1.49)
PROTHROMBIN TIME: 25.4 s — AB (ref 11.6–15.2)

## 2014-10-23 ENCOUNTER — Encounter (HOSPITAL_COMMUNITY)
Admission: RE | Admit: 2014-10-23 | Discharge: 2014-10-23 | Disposition: A | Discharge status: Home / Self Care | Payer: Commercial Managed Care - HMO | Source: Ambulatory Visit | Attending: Internal Medicine | Admitting: Internal Medicine

## 2014-10-23 LAB — PROTIME-INR
INR: 2.58 — AB (ref 0.00–1.49)
Prothrombin Time: 27.3 seconds — ABNORMAL HIGH (ref 11.6–15.2)

## 2014-10-25 ENCOUNTER — Non-Acute Institutional Stay (SKILLED_NURSING_FACILITY): Payer: Commercial Managed Care - HMO | Admitting: Internal Medicine

## 2014-10-25 ENCOUNTER — Encounter (HOSPITAL_COMMUNITY)
Admission: AD | Admit: 2014-10-25 | Discharge: 2014-10-25 | Disposition: A | Discharge status: Home / Self Care | Payer: Commercial Managed Care - HMO | Source: Skilled Nursing Facility | Attending: Internal Medicine | Admitting: Internal Medicine

## 2014-10-25 DIAGNOSIS — E119 Type 2 diabetes mellitus without complications: Secondary | ICD-10-CM

## 2014-10-25 DIAGNOSIS — I509 Heart failure, unspecified: Secondary | ICD-10-CM

## 2014-10-25 LAB — CBC
HEMATOCRIT: 31.2 % — AB (ref 39.0–52.0)
HEMOGLOBIN: 9.7 g/dL — AB (ref 13.0–17.0)
MCH: 29.2 pg (ref 26.0–34.0)
MCHC: 31.1 g/dL (ref 30.0–36.0)
MCV: 94 fL (ref 78.0–100.0)
Platelets: 191 10*3/uL (ref 150–400)
RBC: 3.32 MIL/uL — ABNORMAL LOW (ref 4.22–5.81)
RDW: 15.1 % (ref 11.5–15.5)
WBC: 5.6 10*3/uL (ref 4.0–10.5)

## 2014-10-25 LAB — BASIC METABOLIC PANEL
Anion gap: 5 (ref 5–15)
BUN: 17 mg/dL (ref 6–20)
CALCIUM: 8.5 mg/dL — AB (ref 8.9–10.3)
CHLORIDE: 105 mmol/L (ref 101–111)
CO2: 28 mmol/L (ref 22–32)
Creatinine, Ser: 0.63 mg/dL (ref 0.61–1.24)
GFR calc Af Amer: >60 mL/min (ref >60)
GFR calc non Af Amer: >60 mL/min (ref >60)
Glucose, Bld: 90 mg/dL (ref 65–99)
POTASSIUM: 3.8 mmol/L (ref 3.5–5.1)
Sodium: 138 mmol/L (ref 135–145)

## 2014-10-25 LAB — PROTIME-INR
INR: 3.08 — ABNORMAL HIGH (ref 0.00–1.49)
Prothrombin Time: 31.2 seconds — ABNORMAL HIGH (ref 11.6–15.2)

## 2014-10-29 LAB — PROTIME-INR
INR: 2.89 — ABNORMAL HIGH (ref 0.00–1.49)
Prothrombin Time: 29.8 seconds — ABNORMAL HIGH (ref 11.6–15.2)

## 2014-11-01 ENCOUNTER — Non-Acute Institutional Stay (SKILLED_NURSING_FACILITY): Payer: Commercial Managed Care - HMO | Admitting: Internal Medicine

## 2014-11-01 ENCOUNTER — Encounter: Payer: Self-pay | Admitting: Internal Medicine

## 2014-11-01 ENCOUNTER — Encounter (HOSPITAL_COMMUNITY)
Admission: AD | Admit: 2014-11-01 | Discharge: 2014-11-01 | Disposition: A | Discharge status: Home / Self Care | Payer: Commercial Managed Care - HMO | Source: Skilled Nursing Facility | Attending: Internal Medicine | Admitting: Internal Medicine

## 2014-11-01 DIAGNOSIS — I10 Essential (primary) hypertension: Secondary | ICD-10-CM | POA: Diagnosis not present

## 2014-11-01 DIAGNOSIS — Z951 Presence of aortocoronary bypass graft: Secondary | ICD-10-CM

## 2014-11-01 DIAGNOSIS — I509 Heart failure, unspecified: Secondary | ICD-10-CM | POA: Diagnosis not present

## 2014-11-01 DIAGNOSIS — D696 Thrombocytopenia, unspecified: Secondary | ICD-10-CM

## 2014-11-01 LAB — PROTIME-INR
INR: 2.72 — AB (ref 0.00–1.49)
PROTHROMBIN TIME: 28.4 s — AB (ref 11.6–15.2)

## 2014-11-01 LAB — BASIC METABOLIC PANEL
Anion gap: 5 (ref 5–15)
BUN: 20 mg/dL (ref 6–20)
CO2: 31 mmol/L (ref 22–32)
CREATININE: 0.81 mg/dL (ref 0.61–1.24)
Calcium: 8.5 mg/dL — ABNORMAL LOW (ref 8.9–10.3)
Chloride: 101 mmol/L (ref 101–111)
GFR calc Af Amer: >60 mL/min (ref >60)
GFR calc non Af Amer: >60 mL/min (ref >60)
GLUCOSE: 89 mg/dL (ref 65–99)
POTASSIUM: 3.9 mmol/L (ref 3.5–5.1)
Sodium: 137 mmol/L (ref 135–145)

## 2014-11-01 NOTE — Progress Notes (Signed)
Patient ID: Benjamin Cordova, male   DOB: Mar 09, 1935, 80 y.o.   MRN: [id number]     This is a discharge note  Level care skilled.  Facility [facility].  CC-discharge note.  History of present illness.  Patient is a pleasant 80 year old male with a history of coronary artery disease and MI status post CABG times 07/20/1979 hypertension polio and TIA.  He had increasing chest pain with diaphoresis and nausea his troponins were negative 3 in the hospital cataract catheterization did reveal severe 3 vessel coronary artery disease with graft occlusion ejection fraction 35%.  He did receive his CABG with redoing his coronary bypass grafting 2  .  Apparently he tolerated the procedure well.  He was started on Imdur as well as Lopressor this was eventually switched to atenolol.  Postop he was volume overloaded and diuresed.  He also required a transfusion for postop anemia 7.4 this has risen  up to 9.7 on lab done on July 25.   he also has mild thrombocytopenia but this appears to have resolved as well platelets most recently 191,000 on July 25.  Patient did have some ambulation difficulties he does have a history of polio which has affected his right leg.  Patient also was treated with ciprofloxacin for suspected UTI.  Patient's Coumadin eventually was restarted he was on this apparently for a history of TIAs it is therapeutic today at 2.72.  Today he has no acute complaints--he is using spirometry and feels he is benefiting from this h-is doing nebs every 6 hours when necessary  Patient will be going home he is looking forward to this he will be with his wife he will need continued PT and OT for strengthening as well as nursing RN support for his multiple medical issues including Coumadin management and recent CABG   Previous medical history.  History of coronary artery disease status post CABG as noted above result CABG in 1981.  Hypertension.  Peripheral vascular  disease.  History of TIA on chronic Coumadin.  Pulse 3 of polio which affects the right leg.  History of tobacco abuse.  Postop anemia.  Mild thrombocytopenia.  History of CHF ejection fraction 35%.  Surgical history.  Previous history of tonsillectomy-foot surgery-CABGs as noted above.  Medications.  Aspirin 81 mg enteric-coated daily.  Atenolol 50 mg twice a day.  Folic acid 825 g daily.  Lasix 20 mg QD.  Imdur 30 mg daily for one month.  Lisinopril 2.5 mg every morning.  Multivitamin daily.  .  Prednisone 10 mg daily.  Simvastatin 40 mg daily at bedtime.  Tramadol 1 or 2 tabs 50 mg tab every 4 hours when necessary.  Vitamin C 500 mg daily.  Coumadin 9 mg daily.  Social history.  Patient quit smoking 35 years ago has a 28-pack-year smoking history Also has a history of snuff use in the past.  Family history reviewed per admission H&P on 10/01/2014.  Review of systems.  General does not complain of any fever or chills.  Skin does not complain of rashes or itching surgical sites appear to be unremarkable.  Head ears eyes nose mouth and throat-does not complain of any visual changes sore throat or nasal discharge.  Respiratory does not complain of shortness breath or cough   Cardiac does not complain of chest pain palpitations or significant lower extremity edema.  GU does not complain of dysuria.  Musculoskeletal has some weakness lower extremities but does not complain of acute weakness or pain he  does have some weakness right leg status post polio.  Neurologic again deficits right leg weakness status post polio does not complain of numbness headache or syncopal-type feelings.  Psych does not complain of depression or anxiety.  Physical exam.  Temperature 98.3 pulse 100 respirations 20 blood pressure 139/64 weight is relatively stable at 222.1.  In general this is a very pleasant elderly male in no distress r.  His skin is warm and  dry.  He does have what appears to be a well-healing mid chest scar there is crusting I do not see any erythema drainage or sign of infection.  He also has a surgical scar left low arm which appears similarly unremarkable.  He does have violaceous bruising of his left groin and arms bilaterally.  Eyes he does have prescription lenses pupils appear reactive light visual acuity appears grossly intact the erythema of his right conjunctiva appears to be resolving I suspect this was a subconjunctival hemorrhage.  Oropharynx clear mucous membranes moist.  Chest clear to auscultation there is no labored breathing.  It is regular rate and rhythm without murmur gallop or rub he does have some chronic mild left lower extremity edema -- pedal pulses appear to be intact.  Abdomen soft nontender positive bowel sounds. .  Muscle skeletal has some weakness lower extremities right greater than left again he does have a history of polio He does ambulate with a rolling walker but has weakness- I did not note any deformities upper extremities strength appears to be preserved bilaterally.  Neurologic again does have the right leg weakness otherwise neurologically appears to be intact cranial nerves intact to speech is clear.  Psych he is alert and oriented pleasant and appropriate.  Labs  11/01/2014.  Sodium 137 potassium 3.9 BUN 20 creatinine 0.85.  10/25/2014.  Hemoglobin notable to 9.7 appears to be rising platelets of 191,000.  10/15/2014.  Sodium 132 potassium 4.1 BUN 34 creatinine 0.83.  INR 1.29.  WBC 6.8 hemoglobin 8.3 platelets 166.  10/13/2014.  Hemoglobin A1c-5.6.  Assessment plan.  #1 status post CABG redo 2 with history coronary artery disease chest pain this appears to have stabilized he tolerated procedure well he did have postop anemia and required transfusion hemoglobin is trending up strictly 9.7  In regards to coronary artery disease he continues on aspirin-also on  Coumadin with previous history of TIAs.--INR is therapeutic this will need follow-up by home health later this week no 5 primary care provider of results  Is on a beta blocker as well as low dose ACE inhibitor also on Imdur-clinically appears to be stable   #2 history CHF with ejection fraction of 35% he is on Lasix grams a day again is on an Ace as well as a beta blocker and statin- clinically he appears to be compensated at this time.  #3 history of hypertension appears to be stable recent blood pressures 139/64-107/61-133/69 he is on atenolol 50 mg twice a day as well as lisinopril 2.5 mg a day.  #4-history of TIAs again  He is on Coumadin INR is therapeutic today update INR  #5-history of thrombocytopenia  This has improved of 291,000 on lab done last week  #60-patient is on prednisone however blood sugars appear to be fairly stable largely in the lower 100s in the morning and mid 100s at HS.  Again patient appears to have done well he has gained strength he will need continued PT and OT as well as nursing support for his medical issues including  recent CABG and Coumadin management-he does have a rolling walker at home he does have a supportive wife.  [id number] note greater than 30 minutes spent on this discharge summary-greater than 50% of time spent coordinating and formulating a plan of care for numerous diagnoses   .

## 2014-11-02 NOTE — Progress Notes (Addendum)
Patient ID: Benjamin Cordova, male   DOB: 1934/12/02, 80 y.o.   MRN: [id number]                PROGRESS NOTE  DATE:  10/25/2014        FACILITY: [facility]                      LEVEL OF CARE:   SNF   Acute Visit                CHIEF COMPLAINT:  Review of medical issues post CABG.     HISTORY OF PRESENT ILLNESS:  This is a 80 year-old man who came to [place] after a stay at [facility] from 10/01/2014 through 10/14/2014.  He had basically been readmitted for a re-do CABG.  His ejection fraction at the time of cardiac catheterization was 35%.  He had been on Coumadin for years and was started back on this at 10 mg (history of TIA).    He has been working hard with therapy.  He has a history of polio in the left leg.   Gait was somewhat unsteady when he came in, but apparently that is improving.    His hemoglobin A1c is 5.4.  The facility has been doing CBGs on him twice a day.  They are well controlled in the morning, somewhat elevated later in the day.  At this point, I am not prepared to label him as diabetic and he has not been a diabetic previously.    REVIEW OF SYSTEMS:    CHEST/RESPIRATORY:  He is somewhat short of breath on exertion.   CARDIAC:  Absolutely no chest pain or palpitations.   GI:  No abdominal pain or diarrhea.   EDEMA/VARICOSITIES:  Extremities:  He is noting some edema.       PHYSICAL EXAMINATION:   VITAL SIGNS:     PULSE:  105 and what feels to be regular.   RESPIRATIONS:  20 and unlabored.   BLOOD PRESSURE:  123/79.   GENERAL APPEARANCE:  He looks to be in no distress.    CHEST/RESPIRATORY:  Clear air entry bilaterally.    CARDIOVASCULAR:   CARDIAC:  What sounds to be a regular mild tachycardia.  There is no S3.  His JVP is not elevated.      GASTROINTESTINAL:   LIVER/SPLEEN/KIDNEYS:  No liver, no spleen.  No tenderness.     CIRCULATION:   EDEMA/VARICOSITIES:  Extremities:  He has bilateral lower extremity edema to the mid calf, but no evidence of a  DVT.     ASSESSMENT/PLAN:               Edema/CHF.  I note that he was originally admitted here on Lasix 40 mg for five days only.  I am going to restart him on 20 mg a day.  His weight has gradually gone up by roughly five pounds since his arrival here.    Hemoglobin A1c is well within the normal range.  I do not see the reason for doing the CBGs with this frequency and I will stop this.    INR is 3.08 on his 10 mg.  I am going to drop this to 9 mg and follow up on Thursday.  The 10 mg is his chronic dose.    Postoperative anemia.  His hemoglobin is up to 9.7.  Platelet count is stable at 191,000.

## 2014-11-04 DIAGNOSIS — Z48812 Encounter for surgical aftercare following surgery on the circulatory system: Secondary | ICD-10-CM | POA: Diagnosis not present

## 2014-11-04 DIAGNOSIS — D649 Anemia, unspecified: Secondary | ICD-10-CM | POA: Diagnosis not present

## 2014-11-04 DIAGNOSIS — I739 Peripheral vascular disease, unspecified: Secondary | ICD-10-CM | POA: Diagnosis not present

## 2014-11-04 DIAGNOSIS — Z7901 Long term (current) use of anticoagulants: Secondary | ICD-10-CM | POA: Diagnosis not present

## 2014-11-04 DIAGNOSIS — M89652 Osteopathy after poliomyelitis, left thigh: Secondary | ICD-10-CM | POA: Diagnosis not present

## 2014-11-04 DIAGNOSIS — I2510 Atherosclerotic heart disease of native coronary artery without angina pectoris: Secondary | ICD-10-CM | POA: Diagnosis not present

## 2014-11-04 DIAGNOSIS — I10 Essential (primary) hypertension: Secondary | ICD-10-CM | POA: Diagnosis not present

## 2014-11-04 DIAGNOSIS — B91 Sequelae of poliomyelitis: Secondary | ICD-10-CM | POA: Diagnosis not present

## 2014-11-04 DIAGNOSIS — I480 Paroxysmal atrial fibrillation: Secondary | ICD-10-CM | POA: Diagnosis not present

## 2014-11-05 ENCOUNTER — Ambulatory Visit (HOSPITAL_COMMUNITY): Payer: Commercial Managed Care - HMO | Attending: Family Medicine

## 2014-11-05 DIAGNOSIS — R0989 Other specified symptoms and signs involving the circulatory and respiratory systems: Secondary | ICD-10-CM | POA: Diagnosis not present

## 2014-11-05 DIAGNOSIS — J90 Pleural effusion, not elsewhere classified: Secondary | ICD-10-CM | POA: Insufficient documentation

## 2014-11-05 DIAGNOSIS — J9811 Atelectasis: Secondary | ICD-10-CM | POA: Diagnosis not present

## 2014-11-05 DIAGNOSIS — Z951 Presence of aortocoronary bypass graft: Secondary | ICD-10-CM | POA: Diagnosis not present

## 2014-11-05 DIAGNOSIS — I2510 Atherosclerotic heart disease of native coronary artery without angina pectoris: Secondary | ICD-10-CM | POA: Diagnosis not present

## 2014-11-05 DIAGNOSIS — Z7901 Long term (current) use of anticoagulants: Secondary | ICD-10-CM | POA: Diagnosis not present

## 2014-11-15 ENCOUNTER — Other Ambulatory Visit: Payer: Self-pay | Admitting: Thoracic Surgery (Cardiothoracic Vascular Surgery)

## 2014-11-15 DIAGNOSIS — Z951 Presence of aortocoronary bypass graft: Secondary | ICD-10-CM

## 2014-11-16 ENCOUNTER — Encounter: Payer: Self-pay | Admitting: *Deleted

## 2014-11-16 ENCOUNTER — Ambulatory Visit
Admission: RE | Admit: 2014-11-16 | Discharge: 2014-11-16 | Disposition: A | Discharge status: Home / Self Care | Payer: Commercial Managed Care - HMO | Source: Ambulatory Visit | Attending: Thoracic Surgery (Cardiothoracic Vascular Surgery) | Admitting: Thoracic Surgery (Cardiothoracic Vascular Surgery)

## 2014-11-16 ENCOUNTER — Other Ambulatory Visit: Payer: Self-pay | Admitting: *Deleted

## 2014-11-16 ENCOUNTER — Encounter: Payer: Self-pay | Admitting: Thoracic Surgery (Cardiothoracic Vascular Surgery)

## 2014-11-16 ENCOUNTER — Ambulatory Visit (INDEPENDENT_AMBULATORY_CARE_PROVIDER_SITE_OTHER): Payer: Self-pay | Admitting: Thoracic Surgery (Cardiothoracic Vascular Surgery)

## 2014-11-16 VITALS — BP 147/84 | HR 103 | Resp 18 | Ht 68.5 in | Wt 227.0 lb

## 2014-11-16 DIAGNOSIS — I517 Cardiomegaly: Secondary | ICD-10-CM | POA: Diagnosis not present

## 2014-11-16 DIAGNOSIS — Z951 Presence of aortocoronary bypass graft: Secondary | ICD-10-CM

## 2014-11-16 DIAGNOSIS — J90 Pleural effusion, not elsewhere classified: Secondary | ICD-10-CM | POA: Diagnosis not present

## 2014-11-16 DIAGNOSIS — I25710 Atherosclerosis of autologous vein coronary artery bypass graft(s) with unstable angina pectoris: Secondary | ICD-10-CM

## 2014-11-16 NOTE — Progress Notes (Signed)
[address]       [place] [address]             [phone]       HPI:  Benjamin Cordova returns today for scheduled postoperative follow-up visit.  He is a 80 year old man who had a redo coronary bypass grafting 2 with the left mammary to his LAD and a left radial to OM 2 on 10/08/2014. He was discharged to an SNF on postoperative day #7.  He says that for the first several weeks at the nursing facility that they would not let him get out of bed. He is very unhappy with the care he received there.  He denies pain. He has some chronic swelling in his left leg that is unchanged. He gets fatigued with activity and is currently using a walker. He is not having any anginal pain.  Past Medical History  Diagnosis Date  . Coronary artery disease     Multivessel s/p CABG 1981 at [facility]  . Essential hypertension   . Myocardial infarction 1980  . Arthritis   . Hematuria   . Bladder infection, acute   . Peripheral vascular disease   . Polio osteopathy of lower leg Age 10    Affected right leg  . TIA (transient ischemic attack)     Chronic coumadin     Current Outpatient Prescriptions  Medication Sig Dispense Refill  . acidophilus (RISAQUAD) CAPS capsule Take 1 capsule by mouth daily.    Marland Kitchen aspirin EC 81 MG EC tablet Take 1 tablet (81 mg total) by mouth daily.    Marland Kitchen atenolol (TENORMIN) 100 MG tablet Take 0.5 tablets (50 mg total) by mouth 2 (two) times daily.    . B Complex-C-Folic Acid (SUPER B-COMPLEX/VIT C/FA) TABS Take 1 tablet by mouth daily.      . folic acid (FOLVITE) 027 MCG tablet Take 800 mcg by mouth daily.     Marland Kitchen lisinopril (PRINIVIL,ZESTRIL) 2.5 MG tablet Take 1 tablet (2.5 mg total) by mouth every morning.    . Multiple Vitamin (MULTIVITAMIN PO) Take 1 tablet by mouth daily.      . simvastatin (ZOCOR) 80 MG tablet Take 40 mg by mouth at bedtime.    . vitamin C (ASCORBIC ACID) 500 MG tablet Take 500 mg by mouth daily.      Marland Kitchen warfarin (COUMADIN) 10 MG  tablet Take 9 mg by mouth daily. OR AS DIRECTED     No current facility-administered medications for this visit.    Physical Exam BP 147/84 mmHg  Pulse 103  Resp 18  Ht 5' 8.5" (1.74 m)  Wt 227 lb (102.967 kg)  BMI 34.01 kg/m2  SpO33 23% 80 year old man in no acute distress Alert and oriented 3 with no focal deficits Sternum stable, incision healing well Cardiac regular rate and rhythm normal S1 and S2 Lungs clear with equal breath sounds bilaterally Left arm incision well-healed, brisk cap refill and intact sensation left hand  Diagnostic Tests: I personally reviewed the chest x-ray. It shows postoperative changes. There is a small pleural effusion.  Impression: Benjamin Cordova is a 80 year old man who had redo bypass grafting 2 about 6 weeks ago. He was slow to progress postoperatively and unfortunately had a bad experience at his skilled nursing facility. He now is back at home and using a walker to ambulate. He was able to drive down from [place] for his appointment today.  From a surgical standpoint his incisions are healing  nicely. He is not having any anginal pain. His activities at this point are unrestricted, but he was cautioned to build into new activities gradually. I think it will take him several months to get back to his baseline strength.  He has not been seen by Dr. Domenic Polite or Dr. Karie Kirks since his surgery. We will help arrange follow-up with both of them.  Plan: Follow-up with Dr. Domenic Polite Dr. Karie Kirks.  I will be happy to see him back any time if I can be of any further assistance with his care.  Melrose Nakayama, MD [facility] [phone]

## 2014-11-17 ENCOUNTER — Ambulatory Visit: Payer: Commercial Managed Care - HMO | Admitting: Cardiology

## 2014-11-17 DIAGNOSIS — Z7901 Long term (current) use of anticoagulants: Secondary | ICD-10-CM | POA: Diagnosis not present

## 2014-11-24 DIAGNOSIS — Z7901 Long term (current) use of anticoagulants: Secondary | ICD-10-CM | POA: Diagnosis not present

## 2014-11-24 DIAGNOSIS — Z008 Encounter for other general examination: Secondary | ICD-10-CM | POA: Diagnosis not present

## 2014-11-24 DIAGNOSIS — Z23 Encounter for immunization: Secondary | ICD-10-CM | POA: Diagnosis not present

## 2014-11-24 DIAGNOSIS — M25562 Pain in left knee: Secondary | ICD-10-CM | POA: Diagnosis not present

## 2014-12-03 ENCOUNTER — Encounter: Payer: Self-pay | Admitting: Cardiology

## 2014-12-03 ENCOUNTER — Ambulatory Visit (INDEPENDENT_AMBULATORY_CARE_PROVIDER_SITE_OTHER): Payer: Commercial Managed Care - HMO | Admitting: Cardiology

## 2014-12-03 VITALS — BP 130/86 | HR 88 | Ht 69.0 in | Wt 226.8 lb

## 2014-12-03 DIAGNOSIS — I2510 Atherosclerotic heart disease of native coronary artery without angina pectoris: Secondary | ICD-10-CM | POA: Diagnosis not present

## 2014-12-03 DIAGNOSIS — I10 Essential (primary) hypertension: Secondary | ICD-10-CM

## 2014-12-03 DIAGNOSIS — Z8673 Personal history of transient ischemic attack (TIA), and cerebral infarction without residual deficits: Secondary | ICD-10-CM

## 2014-12-03 DIAGNOSIS — E785 Hyperlipidemia, unspecified: Secondary | ICD-10-CM | POA: Diagnosis not present

## 2014-12-03 NOTE — Progress Notes (Signed)
Cardiology Office Note  Date: 12/03/2014   ID: ROMELO SCIANDRA, DOB November 22, 1934, MRN [id number]  PCP: Robert Bellow, MD  Primary Cardiologist: Rozann Lesches, MD   Chief Complaint  Patient presents with  . CAD status post CABG  . Hospitalization Follow-up    History of Present Illness: Benjamin Cordova is a 80 y.o. male that I met back in July in hospital consultation. He presented with unstable angina. He was referred for a cardiac catheterization which was performed by Dr. Martinique on July 5 revealing multivessel CAD with occluded vein grafts including SVG to diagonal/LAD, SVG to OM1, and SVG to RCA. He was referred for redo bypass surgery which was performed by Dr. Roxan Hockey on July 8 including LIMA to LAD and left radial to OM 2. Postoperative course was notable for anemia requiring PRBC transfusion, UTI, glucose management with insulin, and ultimately resumption of Coumadin. He was seen in the all was by Dr. Roxan Hockey on August 16 for a routine visit.  He states that he has been back at home now for several weeks. Mainly complains of bilateral knee pain, states she had a fall back in January and has had trouble with his left knee specifically since that time. He is using a walker as his strength improves. He reports good appetite, no chest pain or unusual chest soreness in his wound site, no palpitations or syncope. He reports compliance with his medications.  He did not pursue cardiac rehabilitation, states that he plans to do some walking gradually increase his activity at the gym. I reminded him about lifting restrictions.  We reviewed his medications. Discussed stopping aspirin since he is on Coumadin. He has been tolerating Zocor long-term has had good lipid management with LDL in the 50s.   Past Medical History  Diagnosis Date  . Coronary artery disease     Multivessel s/p CABG 1981 at [facility]  . Essential hypertension   . Myocardial infarction 1980  . Arthritis   .  Hematuria   . Bladder infection, acute   . Peripheral vascular disease   . Polio osteopathy of lower leg Age 44    Affected right leg  . TIA (transient ischemic attack)     Chronic coumadin    Past Surgical History  Procedure Laterality Date  . Tonsillectomy  age 89    [facility]  . Foot surgery  1954    [facility], muscle implantation  . Coronary artery bypass graft  1981    [facility]  . Cystoscopy with injection  10/10/2010    Procedure: CYSTOSCOPY WITH INJECTION;  Surgeon: Marissa Nestle;  Location: [facility];  Service: Urology;  Laterality: N/A;  with retrograde urethrogram  . Cardiac catheterization N/A 10/05/2014    Procedure: Left Heart Cath and Coronary Angiography;  Surgeon: Peter M Martinique, MD;  Location: [place] [facility];  Service: Cardiovascular;  Laterality: N/A;  . Coronary artery bypass graft N/A 10/08/2014    Procedure: REDO CORONARY ARTERY BYPASS GRAFTING (CABG) Times Two Grafts using Left Internal Mammary and Left Radial Artery;  Surgeon: Melrose Nakayama, MD;  Location: [facility];  Service: Open Heart Surgery;  Laterality: N/A;  . Tee without cardioversion N/A 10/08/2014    Procedure: TRANSESOPHAGEAL ECHOCARDIOGRAM (TEE);  Surgeon: Melrose Nakayama, MD;  Location: [facility];  Service: Open Heart Surgery;  Laterality: N/A;  . Radial artery harvest Left 10/08/2014    Procedure: Left RADIAL ARTERY HARVEST;  Surgeon: Melrose Nakayama, MD;  Location: [facility];  Service: Open Heart Surgery;  Laterality: Left;    Current Outpatient Prescriptions  Medication Sig Dispense Refill  . acidophilus (RISAQUAD) CAPS capsule Take 1 capsule by mouth daily.    Marland Kitchen aspirin EC 81 MG EC tablet Take 1 tablet (81 mg total) by mouth daily.    Marland Kitchen atenolol (TENORMIN) 50 MG tablet Take 50 mg by mouth daily.    . B Complex-C-Folic Acid (SUPER B-COMPLEX/VIT C/FA) TABS Take 1 tablet by mouth daily.      . folic acid (FOLVITE) 859 MCG tablet Take 800 mcg by mouth daily.     . hydrochlorothiazide (MICROZIDE)  12.5 MG capsule Take 12.5 mg by mouth daily.    Marland Kitchen lisinopril (PRINIVIL,ZESTRIL) 2.5 MG tablet Take 1 tablet (2.5 mg total) by mouth every morning.    . Multiple Vitamin (MULTIVITAMIN PO) Take 1 tablet by mouth daily.      . simvastatin (ZOCOR) 40 MG tablet Take 40 mg by mouth daily.    . vitamin C (ASCORBIC ACID) 500 MG tablet Take 500 mg by mouth daily.      Marland Kitchen warfarin (COUMADIN) 10 MG tablet Take 9 mg by mouth daily. OR AS DIRECTED     No current facility-administered medications for this visit.    Allergies:  Review of patient's allergies indicates no known allergies.   Social History: The patient  reports that he quit smoking about 36 years ago. His smoking use included Cigarettes. He has a 28 pack-year smoking history. His smokeless tobacco use includes Snuff. He reports that he does not drink alcohol or use illicit drugs.   ROS:  Please see the history of present illness. Otherwise, complete review of systems is positive for arthritic pains, specifically bilateral knees.  All other systems are reviewed and negative.   Physical Exam: VS:  BP 130/86 mmHg  Pulse 88  Ht _0  (1.753 m)  Wt 226 lb 12.8 oz (102.876 kg)  BMI 33.48 kg/m2  SpO2 95%, BMI Body mass index is 33.48 kg/(m^2).  Wt Readings from Last 3 Encounters:  12/03/14 226 lb 12.8 oz (102.876 kg)  11/16/14 227 lb (102.967 kg)  10/14/14 218 lb 8 oz (99.111 kg)     General: Overweight male, appears comfortable at rest. Using a walker HEENT: Conjunctiva and lids normal, oropharynx clear. Neck: Supple, no elevated JVP or carotid bruits, no thyromegaly. Lungs: Clear to auscultation, nonlabored breathing at rest. Thorax: Well-healing sternal incision. Cardiac: Regular rate and rhythm, no S3, soft systolic murmur, no pericardial rub. Abdomen: Soft, nontender, bowel sounds present. Extremities: Trace leg edema, distal pulses 2+. Skin: Warm and dry. Musculoskeletal: No kyphosis. Neuropsychiatric: Alert and oriented x3,  affect grossly appropriate.   ECG: Tracing from 10/09/2014 showed sinus tachycardia with old anterior infarct pattern, nonspecific ST/T changes.   Recent Labwork: 10/01/2014: TSH 1.668 10/09/2014: Magnesium 2.3 10/25/2014: Hemoglobin 9.7*; Platelets 191 11/01/2014: BUN 20; Creatinine, Ser 0.81; Potassium 3.9; Sodium 137     Component Value Date/Time   CHOL 155 10/01/2014 1233   TRIG 176* 10/01/2014 1233   HDL 66 10/01/2014 1233   CHOLHDL 2.3 10/01/2014 1233   VLDL 35 10/01/2014 1233   LDLCALC 54 10/01/2014 1233    Other Studies Reviewed Today:  Study Conclusions  - Left ventricle: The cavity size was normal. Wall thickness was increased in a pattern of mild LVH. Systolic function was normal. The estimated ejection fraction was in the range of 50% to 55%. There is akinesis of the mid-apicalanteroseptal and apical myocardium. There is  akinesis of the apicalinferior myocardium. Doppler parameters are consistent with abnormal left ventricular relaxation (grade 1 diastolic dysfunction). - Mitral valve: There was mild regurgitation. - Right atrium: Central venous pressure (est): 3 mm Hg. - Tricuspid valve: There was trivial regurgitation. - Pulmonary arteries: PA peak pressure: 30 mm Hg (S). - Pericardium, extracardiac: There was no pericardial effusion.  Impressions:  - Mild LVH with LVEF 50-55%, wall motion abnormalties as outlined above consistent with ischemic heart disease. Grade 1 diastolic dysfunction. Mild mitral regurgitation. Trivial tricuspid regurgitation with PASP 30 mmHg.  Assessment and Plan:  1. Multivessel CAD with graft disease, now status post redo CABG by Dr. Roxan Hockey in July as outlined above. LVEF was 50-55% by preoperative echocardiogram. Plan is to continue medical therapy. Aspirin being stopped since he is concurrently on Coumadin. Otherwise maintain beta blocker, ACE inhibitor, and statin. Gradually increase activity as tolerated.  2.  Hyperlipidemia, on Zocor long-term with good LDL control.  3. Essential hypertension, no changes made to regimen today.  4. History of previous TIAs, on chronic Coumadin. This is been followed by Dr. Karie Kirks.  Current medicines were reviewed with the patient today.   Orders Placed This Encounter  Procedures  . Lipid Profile    Disposition: FU with me in 3 months.   Signed, Satira Sark, MD, [facility] 12/03/2014 9:03 AM    [facility] [place], [place], [place] [address] Phone: [phone]; Fax: [phone]

## 2014-12-03 NOTE — Patient Instructions (Signed)
Your physician recommends that you schedule a follow-up appointment in: 3 months Dr Domenic Polite in [place] lab work: lipids 1 week before apt FASTING    Thank you for choosing [facility] !

## 2014-12-14 DIAGNOSIS — M25562 Pain in left knee: Secondary | ICD-10-CM | POA: Diagnosis not present

## 2014-12-15 ENCOUNTER — Ambulatory Visit (HOSPITAL_COMMUNITY)
Admission: RE | Admit: 2014-12-15 | Discharge: 2014-12-15 | Disposition: A | Discharge status: Home / Self Care | Payer: Commercial Managed Care - HMO | Source: Ambulatory Visit | Attending: Family Medicine | Admitting: Family Medicine

## 2014-12-15 ENCOUNTER — Other Ambulatory Visit (HOSPITAL_COMMUNITY): Payer: Self-pay | Admitting: Family Medicine

## 2014-12-15 DIAGNOSIS — R0600 Dyspnea, unspecified: Secondary | ICD-10-CM | POA: Diagnosis not present

## 2014-12-15 DIAGNOSIS — N390 Urinary tract infection, site not specified: Secondary | ICD-10-CM | POA: Diagnosis not present

## 2014-12-15 DIAGNOSIS — I2510 Atherosclerotic heart disease of native coronary artery without angina pectoris: Secondary | ICD-10-CM | POA: Diagnosis not present

## 2014-12-15 DIAGNOSIS — R0602 Shortness of breath: Secondary | ICD-10-CM | POA: Diagnosis not present

## 2014-12-15 DIAGNOSIS — Z951 Presence of aortocoronary bypass graft: Secondary | ICD-10-CM | POA: Diagnosis not present

## 2014-12-21 ENCOUNTER — Telehealth: Payer: Self-pay | Admitting: Cardiology

## 2014-12-21 DIAGNOSIS — Z7901 Long term (current) use of anticoagulants: Secondary | ICD-10-CM | POA: Diagnosis not present

## 2014-12-21 NOTE — Telephone Encounter (Signed)
Patient states that he needs surgical clearance for knee replacement. Advised patient to have surgeon Raliegh Ip) to contact our office. / tg

## 2015-01-18 DIAGNOSIS — Z7901 Long term (current) use of anticoagulants: Secondary | ICD-10-CM | POA: Diagnosis not present

## 2015-02-14 DIAGNOSIS — Z7901 Long term (current) use of anticoagulants: Secondary | ICD-10-CM | POA: Diagnosis not present

## 2015-02-21 DIAGNOSIS — Z7901 Long term (current) use of anticoagulants: Secondary | ICD-10-CM | POA: Diagnosis not present

## 2015-03-05 ENCOUNTER — Other Ambulatory Visit: Payer: Self-pay | Admitting: Cardiology

## 2015-03-05 DIAGNOSIS — E785 Hyperlipidemia, unspecified: Secondary | ICD-10-CM | POA: Diagnosis not present

## 2015-03-05 LAB — LIPID PANEL
CHOL/HDL RATIO: 3 ratio (ref <=5.0)
Cholesterol: 118 mg/dL — ABNORMAL LOW (ref 125–200)
HDL: 39 mg/dL — ABNORMAL LOW (ref >=40)
LDL Cholesterol: 58 mg/dL (ref <130)
TRIGLYCERIDES: 104 mg/dL (ref <150)
VLDL: 21 mg/dL (ref <30)

## 2015-03-07 ENCOUNTER — Encounter: Payer: Self-pay | Admitting: Cardiology

## 2015-03-07 ENCOUNTER — Ambulatory Visit (INDEPENDENT_AMBULATORY_CARE_PROVIDER_SITE_OTHER): Payer: Commercial Managed Care - HMO | Admitting: Cardiology

## 2015-03-07 VITALS — BP 108/66 | HR 96 | Ht 68.0 in | Wt 229.6 lb

## 2015-03-07 DIAGNOSIS — E785 Hyperlipidemia, unspecified: Secondary | ICD-10-CM

## 2015-03-07 DIAGNOSIS — I2510 Atherosclerotic heart disease of native coronary artery without angina pectoris: Secondary | ICD-10-CM | POA: Diagnosis not present

## 2015-03-07 DIAGNOSIS — Z79899 Other long term (current) drug therapy: Secondary | ICD-10-CM

## 2015-03-07 DIAGNOSIS — Z8673 Personal history of transient ischemic attack (TIA), and cerebral infarction without residual deficits: Secondary | ICD-10-CM | POA: Diagnosis not present

## 2015-03-07 DIAGNOSIS — I480 Paroxysmal atrial fibrillation: Secondary | ICD-10-CM

## 2015-03-07 DIAGNOSIS — I10 Essential (primary) hypertension: Secondary | ICD-10-CM | POA: Diagnosis not present

## 2015-03-07 LAB — CBC
HEMATOCRIT: 39.8 % (ref 39.0–52.0)
Hemoglobin: 13.2 g/dL (ref 13.0–17.0)
MCH: 28.7 pg (ref 26.0–34.0)
MCHC: 33.2 g/dL (ref 30.0–36.0)
MCV: 86.5 fL (ref 78.0–100.0)
MPV: 11.5 fL (ref 8.6–12.4)
Platelets: 195 10*3/uL (ref 150–400)
RBC: 4.6 MIL/uL (ref 4.22–5.81)
RDW: 14.4 % (ref 11.5–15.5)
WBC: 10.2 10*3/uL (ref 4.0–10.5)

## 2015-03-07 MED ORDER — ATENOLOL 50 MG PO TABS: 50.0000 mg | ORAL_TABLET | Freq: Every day | ORAL | Status: DC

## 2015-03-07 NOTE — Patient Instructions (Signed)
Your physician recommends that you schedule a follow-up appointment in: 3 months with Dr Domenic Polite   STOP HCTZ   INCREASE Tenormin to 50 mg daily   Your physician recommends that you return for lab work in: CBC    If you need a refill on your cardiac medications before your next appointment, please call your pharmacy.   Thank you for choosing [facility] !

## 2015-03-07 NOTE — Progress Notes (Signed)
Cardiology Office Note  Date: 03/07/2015   ID: CEBERT PERLICK, DOB Apr 03, 1934, MRN [id number]  PCP: Robert Bellow, MD  Primary Cardiologist: Rozann Lesches, MD   Chief Complaint  Patient presents with  . Coronary Artery Disease    History of Present Illness: Benjamin Cordova is a 80 y.o. male last seen in September. He comes in for a routine follow-up visit. Since I last saw him he has had worsening trouble with left knee pain, had an injection done recently that was not effective, and is using a walker to get around. He states that he is following with Dr. Percell Miller, and may need to consider surgery sometime next year.  He is status post redo bypass surgery which was performed by Dr. Roxan Hockey on July 8 including LIMA to LAD and left radial to OM 2. LVEF normal range by echocardiogram, outlined below. He did not pursue cardiac rehabilitation, has been limited his activities due to his knee pain, also short of breath with activity. He does not report any chest pain or palpitations.  I reviewed his medications which are outlined below. He reports compliance, except that he has been taking Tenormin 25 mg daily instead of 50 mg daily. ECG today shows sinus tachycardia with frequent PVCs, increased voltage, nonspecific ST changes. He has had no syncope.  Today we discussed increasing Tenormin, stopping HCTZ, and obtaining a follow-up CBC to make sure that his anemia is improving. Hemoglobin was 9.7 back in July.  Lipids have been well controlled, recent LDL 58 on Zocor.  Past Medical History  Diagnosis Date  . Coronary artery disease     Multivessel s/p CABG [facility] at [facility]  . Essential hypertension   . Myocardial infarction ([place]) 1980  . Arthritis   . Hematuria   . Bladder infection, acute   . Peripheral vascular disease ([place])   . Polio osteopathy of lower leg ([place]) Age 44    Affected right leg  . TIA (transient ischemic attack)     Chronic coumadin    Past Surgical History    Procedure Laterality Date  . Tonsillectomy  age 53    [facility]  . Foot surgery  1954    [facility], muscle implantation  . Coronary artery bypass graft  1981    [facility]  . Cystoscopy with injection  10/10/2010    Procedure: CYSTOSCOPY WITH INJECTION;  Surgeon: Marissa Nestle;  [facility]: [facility];  Service: Urology;  Laterality: N/A;  with retrograde urethrogram  . Cardiac catheterization N/A 10/05/2014    Procedure: Left Heart Cath and Coronary Angiography;  Surgeon: Peter M Martinique, MD;  Location: [facility];  Service: Cardiovascular;  Laterality: N/A;  . Coronary artery bypass graft N/A 10/08/2014    Procedure: REDO CORONARY ARTERY BYPASS GRAFTING (CABG) Times Two Grafts using Left Internal Mammary and Left Radial Artery;  Surgeon: Melrose Nakayama, MD;  Location: [place] [facility];  Service: Open Heart Surgery;  Laterality: N/A;  . Tee without cardioversion N/A 10/08/2014    Procedure: TRANSESOPHAGEAL ECHOCARDIOGRAM (TEE);  Surgeon: Melrose Nakayama, MD;  Location: [facility];  Service: Open Heart Surgery;  Laterality: N/A;  . Radial artery harvest Left 10/08/2014    Procedure: Left RADIAL ARTERY HARVEST;  Surgeon: Melrose Nakayama, MD;  Location: [facility];  Service: Open Heart Surgery;  Laterality: Left;    Current Outpatient Prescriptions  Medication Sig Dispense Refill  . acidophilus (RISAQUAD) CAPS capsule Take 1 capsule by mouth daily.    Marland Kitchen  atenolol (TENORMIN) 50 MG tablet Take 1 tablet (50 mg total) by mouth daily. 90 tablet 3  . B Complex-C-Folic Acid (SUPER B-COMPLEX/VIT C/FA) TABS Take 1 tablet by mouth daily.      . folic acid (FOLVITE) Q000111Q MCG tablet Take 800 mcg by mouth daily.     Marland Kitchen lisinopril (PRINIVIL,ZESTRIL) 2.5 MG tablet Take 1 tablet (2.5 mg total) by mouth every morning.    . Multiple Vitamin (MULTIVITAMIN PO) Take 1 tablet by mouth daily.      . simvastatin (ZOCOR) 40 MG tablet Take 40 mg by mouth daily.    . vitamin C (ASCORBIC ACID) 500 MG tablet Take 500 mg by mouth daily.       Marland Kitchen warfarin (COUMADIN) 10 MG tablet Take 9 mg by mouth daily. OR AS DIRECTED     No current facility-administered medications for this visit.    Allergies:  Review of patient's allergies indicates no known allergies.   Social History: The patient  reports that he quit smoking about 36 years ago. His smoking use included Cigarettes. He has a 28 pack-year smoking history. His smokeless tobacco use includes Snuff. He reports that he does not drink alcohol or use illicit drugs.   ROS:  Please see the history of present illness. Otherwise, complete review of systems is positive for chronic left knee pain, difficulty ambulating.  All other systems are reviewed and negative.   Physical Exam: VS:  BP 108/66 mmHg  Pulse 96  Ht 5\' 8"  (1.727 m)  Wt 229 lb 9.6 oz (104.146 kg)  BMI 34.92 kg/m2  SpO2 90%, BMI Body mass index is 34.92 kg/(m^2).  Wt Readings from Last 3 Encounters:  03/07/15 229 lb 9.6 oz (104.146 kg)  12/03/14 226 lb 12.8 oz (102.876 kg)  11/16/14 227 lb (102.967 kg)    General: Overweight male, appears comfortable at rest. Using a walker HEENT: Conjunctiva and lids normal, oropharynx clear. Neck: Supple, no elevated JVP or carotid bruits, no thyromegaly. Lungs: Clear to auscultation, nonlabored breathing at rest. Thorax: Well-healing sternal incision. Cardiac: Regular rate and rhythm with ectopy, no S3, soft systolic murmur, no pericardial rub. Abdomen: Soft, nontender, bowel sounds present. Extremities: Trace leg edema, distal pulses 2+. Skin: Warm and dry. Musculoskeletal: No kyphosis. Neuropsychiatric: Alert and oriented x3, affect grossly appropriate.  ECG: ECG is ordered today.  Recent Labwork: 10/01/2014: TSH 1.668 10/09/2014: Magnesium 2.3 10/25/2014: Hemoglobin 9.7*; Platelets 191 11/01/2014: BUN 20; Creatinine, Ser 0.81; Potassium 3.9; Sodium 137     Component Value Date/Time   CHOL 118* 03/05/2015 0834   TRIG 104 03/05/2015 0834   HDL 39* 03/05/2015 0834    CHOLHDL 3.0 03/05/2015 0834   VLDL 21 03/05/2015 0834   LDLCALC 58 03/05/2015 0834   Other Studies Reviewed Today:  Echocardiogram 10/01/2014: Study Conclusions  - Left ventricle: The cavity size was normal. Wall thickness was increased in a pattern of mild LVH. Systolic function was normal. The estimated ejection fraction was in the range of 50% to 55%. There is akinesis of the mid-apicalanteroseptal and apical myocardium. There is akinesis of the apicalinferior myocardium. Doppler parameters are consistent with abnormal left ventricular relaxation (grade 1 diastolic dysfunction). - Mitral valve: There was mild regurgitation. - Right atrium: Central venous pressure (est): 3 mm Hg. - Tricuspid valve: There was trivial regurgitation. - Pulmonary arteries: PA peak pressure: 30 mm Hg (S). - Pericardium, extracardiac: There was no pericardial effusion.  Impressions:  - Mild LVH with LVEF 50-55%, wall motion abnormalties as  outlined above consistent with ischemic heart disease. Grade 1 diastolic dysfunction. Mild mitral regurgitation. Trivial tricuspid regurgitation with PASP 30 mmHg.  Chest x-ray 12/15/2014: FINDINGS: The heart size and mediastinal contours are stable. Patient status post median sternotomy and CABG. Both lungs are clear. There is a tiny calcified granuloma in the left upper lobe unchanged. Degenerative joint changes of the spine are noted.  IMPRESSION: No active cardiopulmonary disease.  ASSESSMENT AND PLAN:  1. Multivessel CAD status post redo CABG in July of this year, LVEF 50-55% at that time. He has had dyspnea on exertion which is likely multifactorial. Heart rate elevated with frequent PVCs as well. We will stop HCTZ, increase Tenormin to 50 mg daily, and also check a CBC to make sure that his anemia has improved. Can consider follow-up echocardiogram if needed.  2. Essential hypertension, blood pressure normal today. Stopping HCTZ with  current increase in Tenormin. Otherwise continue lisinopril.  3. History of TIA, on Coumadin followed by Dr. Karie Kirks.  4. Lipids will controlled on Zocor, LDL 58.  5. Chronic left knee pain, history of polio and also arthritis. Currently using a walker. He follows with Dr. Percell Miller. States that he may need to think about surgery sometime next year.  Current medicines were reviewed at length with the patient today.   Orders Placed This Encounter  Procedures  . CBC  . EKG 12-Lead    Disposition: FU with me in 3 months.   Signed, Satira Sark, MD, [facility] 03/07/2015 1:19 PM    [facility] [address] [place], [place] [address] Phone: [phone]; Fax: [phone]

## 2015-03-09 DIAGNOSIS — R262 Difficulty in walking, not elsewhere classified: Secondary | ICD-10-CM | POA: Diagnosis not present

## 2015-03-09 DIAGNOSIS — M25562 Pain in left knee: Secondary | ICD-10-CM | POA: Diagnosis not present

## 2015-03-09 DIAGNOSIS — M1712 Unilateral primary osteoarthritis, left knee: Secondary | ICD-10-CM | POA: Diagnosis not present

## 2015-03-09 DIAGNOSIS — M170 Bilateral primary osteoarthritis of knee: Secondary | ICD-10-CM | POA: Diagnosis not present

## 2015-03-14 DIAGNOSIS — E782 Mixed hyperlipidemia: Secondary | ICD-10-CM | POA: Diagnosis not present

## 2015-03-14 DIAGNOSIS — I10 Essential (primary) hypertension: Secondary | ICD-10-CM | POA: Diagnosis not present

## 2015-03-14 DIAGNOSIS — Z7901 Long term (current) use of anticoagulants: Secondary | ICD-10-CM | POA: Diagnosis not present

## 2015-03-14 DIAGNOSIS — M1712 Unilateral primary osteoarthritis, left knee: Secondary | ICD-10-CM | POA: Diagnosis not present

## 2015-03-21 DIAGNOSIS — Z7901 Long term (current) use of anticoagulants: Secondary | ICD-10-CM | POA: Diagnosis not present

## 2015-04-12 DIAGNOSIS — Z7901 Long term (current) use of anticoagulants: Secondary | ICD-10-CM | POA: Diagnosis not present

## 2015-04-26 DIAGNOSIS — Z7901 Long term (current) use of anticoagulants: Secondary | ICD-10-CM | POA: Diagnosis not present

## 2015-05-11 DIAGNOSIS — Z7901 Long term (current) use of anticoagulants: Secondary | ICD-10-CM | POA: Diagnosis not present

## 2015-05-30 DIAGNOSIS — I519 Heart disease, unspecified: Secondary | ICD-10-CM | POA: Diagnosis not present

## 2015-05-30 DIAGNOSIS — I10 Essential (primary) hypertension: Secondary | ICD-10-CM | POA: Diagnosis not present

## 2015-05-30 DIAGNOSIS — I639 Cerebral infarction, unspecified: Secondary | ICD-10-CM | POA: Diagnosis not present

## 2015-05-30 DIAGNOSIS — E782 Mixed hyperlipidemia: Secondary | ICD-10-CM | POA: Diagnosis not present

## 2015-05-31 DIAGNOSIS — E039 Hypothyroidism, unspecified: Secondary | ICD-10-CM | POA: Diagnosis not present

## 2015-05-31 DIAGNOSIS — I10 Essential (primary) hypertension: Secondary | ICD-10-CM | POA: Diagnosis not present

## 2015-06-06 DIAGNOSIS — I639 Cerebral infarction, unspecified: Secondary | ICD-10-CM | POA: Diagnosis not present

## 2015-06-06 DIAGNOSIS — M25562 Pain in left knee: Secondary | ICD-10-CM | POA: Diagnosis not present

## 2015-06-06 DIAGNOSIS — E782 Mixed hyperlipidemia: Secondary | ICD-10-CM | POA: Diagnosis not present

## 2015-06-06 DIAGNOSIS — R7301 Impaired fasting glucose: Secondary | ICD-10-CM | POA: Diagnosis not present

## 2015-06-06 DIAGNOSIS — I519 Heart disease, unspecified: Secondary | ICD-10-CM | POA: Diagnosis not present

## 2015-06-06 DIAGNOSIS — I10 Essential (primary) hypertension: Secondary | ICD-10-CM | POA: Diagnosis not present

## 2015-06-07 ENCOUNTER — Ambulatory Visit: Payer: Commercial Managed Care - HMO | Admitting: Cardiology

## 2015-06-21 DIAGNOSIS — M25562 Pain in left knee: Secondary | ICD-10-CM | POA: Diagnosis not present

## 2015-07-01 ENCOUNTER — Ambulatory Visit (INDEPENDENT_AMBULATORY_CARE_PROVIDER_SITE_OTHER): Payer: Commercial Managed Care - HMO | Admitting: Cardiology

## 2015-07-01 ENCOUNTER — Encounter: Payer: Self-pay | Admitting: Cardiology

## 2015-07-01 VITALS — BP 102/60 | HR 95 | Ht 68.0 in | Wt 228.0 lb

## 2015-07-01 DIAGNOSIS — I10 Essential (primary) hypertension: Secondary | ICD-10-CM

## 2015-07-01 DIAGNOSIS — Z01810 Encounter for preprocedural cardiovascular examination: Secondary | ICD-10-CM | POA: Diagnosis not present

## 2015-07-01 DIAGNOSIS — I2510 Atherosclerotic heart disease of native coronary artery without angina pectoris: Secondary | ICD-10-CM

## 2015-07-01 DIAGNOSIS — E785 Hyperlipidemia, unspecified: Secondary | ICD-10-CM | POA: Diagnosis not present

## 2015-07-01 NOTE — Progress Notes (Signed)
Cardiology Office Note  Date: 07/01/2015   ID: Benjamin Cordova, DOB 1934/12/04, MRN [id number]  PCP: Wende Neighbors, MD  Primary Cardiologist: Rozann Lesches, MD   Chief Complaint  Patient presents with  . Preoperative evaluation  . Coronary Artery Disease    History of Present Illness: Benjamin Cordova is a 81 y.o. male last seen in December 2016. He is referred back to the office by Dr. Kathryne Hitch for preoperative evaluation prior to planned left total knee replacement. He describes significant functional limitation related to knee pain, has not been able to exercise. From a cardiac perspective, he has recuperated fairly well from redo CABG in July 2016. Not reporting any angina symptoms and describes stable NYHA class II dyspnea.  Her reviewed his medications. He continues on atenolol, lisinopril, and Zocor. He is on Coumadin which is followed by Dr. Nevada Crane for previous history of TIA, no definitive stroke or other arrhythmia however.  Reviewed his echocardiogram report from July 2016. LVEF is in the range of 50-55%.  Past Medical History  Diagnosis Date  . Coronary artery disease     Multivessel s/p CABG 1981 at [facility]  . Essential hypertension   . Myocardial infarction ([place]) 1980  . Arthritis   . Hematuria   . Bladder infection, acute   . Peripheral vascular disease ([place])   . 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 TIA (transient ischemic attack)     Chronic coumadin    Past Surgical History  Procedure Laterality Date  . Tonsillectomy  age 10    [facility]  . Foot surgery  1954    [facility], muscle implantation  . Coronary artery bypass graft  1981    [facility]  . Cystoscopy with injection  10/10/2010    Procedure: CYSTOSCOPY WITH INJECTION;  Surgeon: Marissa Nestle;  [facility];  Service: Urology;  Laterality: N/A;  with retrograde urethrogram  . Cardiac catheterization N/A 10/05/2014    Procedure: Left Heart Cath and Coronary Angiography;   Surgeon: Peter M Martinique, MD;  Location: [place];  Service: Cardiovascular;  Laterality: N/A;  . Coronary artery bypass graft N/A 10/08/2014    Procedure: REDO CORONARY ARTERY BYPASS GRAFTING (CABG) Times Two Grafts using Left Internal Mammary and Left Radial Artery;  Surgeon: Melrose Nakayama, MD;  Location: [place];  Service: Open Heart Surgery;  Laterality: N/A;  . Tee without cardioversion N/A 10/08/2014    Procedure: TRANSESOPHAGEAL ECHOCARDIOGRAM (TEE);  Surgeon: Melrose Nakayama, MD;  Location: [facility];  Service: Open Heart Surgery;  Laterality: N/A;  . Radial artery harvest Left 10/08/2014    Procedure: Left RADIAL ARTERY HARVEST;  Surgeon: Melrose Nakayama, MD;  Location: [facility];  Service: Open Heart Surgery;  Laterality: Left;    Current Outpatient Prescriptions  Medication Sig Dispense Refill  . acidophilus (RISAQUAD) CAPS capsule Take 1 capsule by mouth daily.    Marland Kitchen atenolol (TENORMIN) 50 MG tablet Take 1 tablet (50 mg total) by mouth daily. 90 tablet 3  . B Complex-C-Folic Acid (SUPER B-COMPLEX/VIT C/FA) TABS Take 1 tablet by mouth daily.      . folic acid (FOLVITE) Q000111Q MCG tablet Take 800 mcg by mouth daily.     Marland Kitchen lisinopril (PRINIVIL,ZESTRIL) 2.5 MG tablet Take 1 tablet (2.5 mg total) by mouth every morning.    . Multiple Vitamin (MULTIVITAMIN PO) Take 1 tablet by mouth daily.      Marland Kitchen  simvastatin (ZOCOR) 40 MG tablet Take 40 mg by mouth daily.    . vitamin C (ASCORBIC ACID) 500 MG tablet Take 500 mg by mouth daily.      Marland Kitchen warfarin (COUMADIN) 10 MG tablet Take 9 mg by mouth daily. OR AS DIRECTED     No current facility-administered medications for this visit.   Allergies:  Review of patient's allergies indicates no known allergies.   Social History: The patient  reports that he quit smoking about 36 years ago. His smoking use included Cigarettes. He has a 28 pack-year smoking history. His smokeless tobacco use includes Snuff. He reports that he does not drink alcohol  or use illicit drugs.   ROS:  Please see the history of present illness. Otherwise, complete review of systems is positive for chronic knee pain.  All other systems are reviewed and negative.   Physical Exam: VS:  BP 102/60 mmHg  Pulse 95  Ht 5\' 8"  (1.727 m)  Wt 228 lb (103.42 kg)  BMI 34.68 kg/m2  SpO2 94%, BMI Body mass index is 34.68 kg/(m^2).  Wt Readings from Last 3 Encounters:  07/01/15 228 lb (103.42 kg)  03/07/15 229 lb 9.6 oz (104.146 kg)  12/03/14 226 lb 12.8 oz (102.876 kg)    General: Overweight male, appears comfortable at rest. Using a wheelchair today. HEENT: Conjunctiva and lids normal, oropharynx clear. Neck: Supple, no elevated JVP or carotid bruits, no thyromegaly. Lungs: Clear to auscultation, nonlabored breathing at rest. Thorax: Well-healed sternal incision. Cardiac: Regular rate and rhythm with ectopy, no S3, soft systolic murmur, no pericardial rub. Abdomen: Soft, nontender, bowel sounds present. Extremities: Trace leg edema, distal pulses 2+. Skin: Warm and dry. Musculoskeletal: No kyphosis. Neuropsychiatric: Alert and oriented x3, affect grossly appropriate.  ECG: I personally reviewed the prior tracing from 03/07/2015 which showed sinus tachycardia with frequent PVCs and nonspecific ST changes, IVCD.  Recent Labwork: 10/01/2014: TSH 1.668 10/09/2014: Magnesium 2.3 11/01/2014: BUN 20; Creatinine, Ser 0.81; Potassium 3.9; Sodium 137 03/07/2015: Hemoglobin 13.2; Platelets 195     Component Value Date/Time   CHOL 118* 03/05/2015 0834   TRIG 104 03/05/2015 0834   HDL 39* 03/05/2015 0834   CHOLHDL 3.0 03/05/2015 0834   VLDL 21 03/05/2015 0834   LDLCALC 58 03/05/2015 0834    Other Studies Reviewed Today:  Echocardiogram 10/01/2014: Study Conclusions  - Left ventricle: The cavity size was normal. Wall thickness was increased in a pattern of mild LVH. Systolic function was normal. The estimated ejection fraction was in the range of 50% to 55%. There  is akinesis of the mid-apicalanteroseptal and apical myocardium. There is akinesis of the apicalinferior myocardium. Doppler parameters are consistent with abnormal left ventricular relaxation (grade 1 diastolic dysfunction). - Mitral valve: There was mild regurgitation. - Right atrium: Central venous pressure (est): 3 mm Hg. - Tricuspid valve: There was trivial regurgitation. - Pulmonary arteries: PA peak pressure: 30 mm Hg (S). - Pericardium, extracardiac: There was no pericardial effusion.  Impressions:  - Mild LVH with LVEF 50-55%, wall motion abnormalties as outlined above consistent with ischemic heart disease. Grade 1 diastolic dysfunction. Mild mitral regurgitation. Trivial tricuspid regurgitation with PASP 30 mmHg.  Chest x-ray 12/15/2014: FINDINGS: The heart size and mediastinal contours are stable. Patient status post median sternotomy and CABG. Both lungs are clear. There is a tiny calcified granuloma in the left upper lobe unchanged. Degenerative joint changes of the spine are noted.  IMPRESSION: No active cardiopulmonary disease.  Assessment and Plan:  1. Preoperative  evaluation or to elective left total knee replacement. Patient is now 8 months out from CABG and has recuperated fairly well. He should be able to proceed an acceptable, low to intermediate cardiac risk. Would continue medical therapy. May need further up titration of beta blocker depending on heart rate. His blood pressure is however well controlled today. Recommend holding Coumadin beginning 4 days prior to the planned surgery.  2. Essential hypertension, blood pressure is normal today.  3. Previous history of TIA. He has been on Coumadin, now followed by Dr. Nevada Crane. No other history of cardiac arrhythmia however. Should not necessarily require Lovenox bridging.  4. Hyperlipidemia, on Zocor. LDL 58.  Current medicines were reviewed with the patient today.  Disposition: FU with me in 6  months.   Signed, Satira Sark, MD, [facility] 07/01/2015 2:29 PM    [place] [facility] [address], [place],  [address] Phone: [phone]; Fax: [phone]

## 2015-07-01 NOTE — Patient Instructions (Signed)
Your physician wants you to follow-up in: 6 months You will receive a reminder letter in the mail two months in advance. If you don't receive a letter, please call our office to schedule the follow-up appointment.   Your physician recommends that you continue on your current medications as directed. Please refer to the Current Medication list given to you today.     We will send Dr Noemi Chapel your surgery clearance      Thank you for choosing [facility] !

## 2015-07-02 DIAGNOSIS — Z9289 Personal history of other medical treatment: Secondary | ICD-10-CM

## 2015-07-02 HISTORY — DX: Personal history of other medical treatment: Z92.89

## 2015-07-04 DIAGNOSIS — M25562 Pain in left knee: Secondary | ICD-10-CM | POA: Diagnosis not present

## 2015-07-04 DIAGNOSIS — Z01810 Encounter for preprocedural cardiovascular examination: Secondary | ICD-10-CM | POA: Diagnosis not present

## 2015-07-04 DIAGNOSIS — I639 Cerebral infarction, unspecified: Secondary | ICD-10-CM | POA: Diagnosis not present

## 2015-07-04 DIAGNOSIS — I519 Heart disease, unspecified: Secondary | ICD-10-CM | POA: Diagnosis not present

## 2015-07-08 ENCOUNTER — Other Ambulatory Visit: Payer: Self-pay | Admitting: Physician Assistant

## 2015-07-08 ENCOUNTER — Ambulatory Visit: Payer: Commercial Managed Care - HMO | Admitting: Cardiology

## 2015-07-08 DIAGNOSIS — M25562 Pain in left knee: Secondary | ICD-10-CM | POA: Diagnosis not present

## 2015-07-08 NOTE — H&P (Signed)
TOTAL KNEE ADMISSION H&P  Patient is being admitted for left total knee arthroplasty.  Subjective:  Chief Complaint:left knee pain.  HPI: Benjamin Cordova, 81 y.o. male, has a history of pain and functional disability in the left knee due to arthritis and has failed non-surgical conservative treatments for greater than 12 weeks to includeNSAID's and/or analgesics, corticosteriod injections, use of assistive devices and activity modification.  Onset of symptoms was gradual, starting 3 years ago with gradually worsening course since that time. The patient noted no past surgery on the left knee(s).  Patient currently rates pain in the left knee(s) at 9 out of 10 with activity. Patient has night pain, worsening of pain with activity and weight bearing, pain that interferes with activities of daily living and joint swelling.  Patient has evidence of subchondral sclerosis and joint space narrowing by imaging studies. There is no active infection.  Patient Active Problem List   Diagnosis Date Noted  . S/P CABG x 2 10/08/2014  . Chest pain 10/01/2014  . Elevated INR 10/01/2014  . Leukocytosis 10/01/2014  . Thrombocytopenia ([place]) 10/01/2014  . Chest pain at rest 10/01/2014  . Unstable angina ([place]) 10/01/2014  . Coagulopathy ([place]) 10/01/2014  . Paroxysmal atrial fibrillation ([place]) 10/01/2014  . Coronary artery disease   . Hypertension   . Arthritis   . Peripheral vascular disease ([place])   . Essential hypertension    Past Medical History  Diagnosis Date  . Coronary artery disease     Multivessel s/p CABG [facility] at [facility]  . Essential hypertension   . Myocardial infarction ([place]) 1980  . Arthritis   . Hematuria   . Bladder infection, acute   . Peripheral vascular disease ([place])   . 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 TIA (transient ischemic attack)     Chronic coumadin    Past Surgical History  Procedure Laterality Date  . Tonsillectomy  age 65    [facility]  . Foot  surgery  1954    [facility], muscle implantation  . Coronary artery bypass graft  1981    [facility]  . Cystoscopy with injection  10/10/2010    Procedure: CYSTOSCOPY WITH INJECTION;  Surgeon: Marissa Nestle;  [facility];  Service: Urology;  Laterality: N/A;  with retrograde urethrogram  . Cardiac catheterization N/A 10/05/2014    Procedure: Left Heart Cath and Coronary Angiography;  Surgeon: Peter M Martinique, MD;  Location: [facility];  Service: Cardiovascular;  Laterality: N/A;  . Coronary artery bypass graft N/A 10/08/2014    Procedure: REDO CORONARY ARTERY BYPASS GRAFTING (CABG) Times Two Grafts using Left Internal Mammary and Left Radial Artery;  Surgeon: Melrose Nakayama, MD;  Location: [facility];  Service: Open Heart Surgery;  Laterality: N/A;  . Tee without cardioversion N/A 10/08/2014    Procedure: TRANSESOPHAGEAL ECHOCARDIOGRAM (TEE);  Surgeon: Melrose Nakayama, MD;  Location: [facility];  Service: Open Heart Surgery;  Laterality: N/A;  . Radial artery harvest Left 10/08/2014    Procedure: Left RADIAL ARTERY HARVEST;  Surgeon: Melrose Nakayama, MD;  Location: [facility];  Service: Open Heart Surgery;  Laterality: Left;     (Not in a hospital admission) No Known Allergies  Social History  Substance Use Topics  . Smoking status: Former Smoker -- 1.00 packs/day for 28 years    Types: Cigarettes    Quit date: 12/02/1978  . Smokeless tobacco: Current User    Types:  Snuff  . Alcohol Use: No    Family History  Problem Relation Age of Onset  . Anesthesia problems Neg Hx   . Hypotension Neg Hx   . Malignant hyperthermia Neg Hx   . Pseudochol deficiency Neg Hx      Review of Systems  Constitutional: Negative.   HENT: Negative.   Eyes: Negative.   Respiratory: Negative.   Cardiovascular: Negative.   Gastrointestinal: Negative.   Genitourinary: Negative.   Musculoskeletal: Positive for joint pain.  Skin: Negative.   Neurological: Negative.   Endo/Heme/Allergies:  Negative.   Psychiatric/Behavioral: Negative.     Objective:  Physical Exam  Constitutional: He is oriented to person, place, and time. He appears well-developed and well-nourished.  HENT:  Head: Normocephalic and atraumatic.  Eyes: EOM are normal. Pupils are equal, round, and reactive to light.  Neck: Normal range of motion. Neck supple.  Cardiovascular: Normal rate.   Murmur heard. Respiratory: Effort normal and breath sounds normal.  GI: Soft. Bowel sounds are normal.  Musculoskeletal:  Examination of his left knee reveals a 2+ effusion.  Range of motion 3-100 degrees.  Medial joint line tenderness.  Varus thrust partially correctable.  He is neurovascularly intact distally.    Neurological: He is alert and oriented to person, place, and time.  Skin: Skin is warm and dry.  Psychiatric: He has a normal mood and affect. His behavior is normal. Judgment and thought content normal.    Vital signs in last 24 hours: @VSRANGES @  Labs:   Estimated body mass index is 34.68 kg/(m^2) as calculated from the following:   Height as of 07/01/15: 5\' 8"  (1.727 m).   Weight as of 07/01/15: 103.42 kg (228 lb).   Imaging Review Plain radiographs demonstrate severe degenerative joint disease of the left knee(s). The overall alignment ismild varus. The bone quality appears to be fair for age and reported activity level.  Assessment/Plan:  End stage arthritis, left knee   The patient history, physical examination, clinical judgment of the provider and imaging studies are consistent with end stage degenerative joint disease of the left knee(s) and total knee arthroplasty is deemed medically necessary. The treatment options including medical management, injection therapy arthroscopy and arthroplasty were discussed at length. The risks and benefits of total knee arthroplasty were presented and reviewed. The risks due to aseptic loosening, infection, stiffness, patella tracking problems,  thromboembolic complications and other imponderables were discussed. The patient acknowledged the explanation, agreed to proceed with the plan and consent was signed. Patient is being admitted for inpatient treatment for surgery, pain control, PT, OT, prophylactic antibiotics, VTE prophylaxis, progressive ambulation and ADL's and discharge planning. The patient is planning to be discharged to skilled nursing facility

## 2015-07-14 ENCOUNTER — Encounter (HOSPITAL_COMMUNITY): Payer: Self-pay

## 2015-07-14 ENCOUNTER — Encounter (HOSPITAL_COMMUNITY)
Admission: RE | Admit: 2015-07-14 | Discharge: 2015-07-14 | Disposition: A | Discharge status: Home / Self Care | Payer: Commercial Managed Care - HMO | Source: Ambulatory Visit | Attending: Orthopedic Surgery | Admitting: Orthopedic Surgery

## 2015-07-14 DIAGNOSIS — R9431 Abnormal electrocardiogram [ECG] [EKG]: Secondary | ICD-10-CM | POA: Diagnosis not present

## 2015-07-14 DIAGNOSIS — Z01812 Encounter for preprocedural laboratory examination: Secondary | ICD-10-CM | POA: Insufficient documentation

## 2015-07-14 DIAGNOSIS — Z01818 Encounter for other preprocedural examination: Secondary | ICD-10-CM | POA: Diagnosis not present

## 2015-07-14 DIAGNOSIS — Z0183 Encounter for blood typing: Secondary | ICD-10-CM | POA: Insufficient documentation

## 2015-07-14 DIAGNOSIS — I10 Essential (primary) hypertension: Secondary | ICD-10-CM | POA: Insufficient documentation

## 2015-07-14 DIAGNOSIS — M1712 Unilateral primary osteoarthritis, left knee: Secondary | ICD-10-CM | POA: Insufficient documentation

## 2015-07-14 LAB — BASIC METABOLIC PANEL
ANION GAP: 11 (ref 5–15)
BUN: 33 mg/dL — ABNORMAL HIGH (ref 6–20)
CALCIUM: 9.1 mg/dL (ref 8.9–10.3)
CO2: 25 mmol/L (ref 22–32)
Chloride: 109 mmol/L (ref 101–111)
Creatinine, Ser: 0.81 mg/dL (ref 0.61–1.24)
Glucose, Bld: 125 mg/dL — ABNORMAL HIGH (ref 65–99)
Potassium: 4.3 mmol/L (ref 3.5–5.1)
Sodium: 145 mmol/L (ref 135–145)

## 2015-07-14 LAB — TYPE AND SCREEN
ABO/RH(D): O POS
ANTIBODY SCREEN: NEGATIVE

## 2015-07-14 LAB — PROTIME-INR
INR: 5.3 — AB (ref 0.00–1.49)
Prothrombin Time: 46.8 seconds — ABNORMAL HIGH (ref 11.6–15.2)

## 2015-07-14 LAB — CBC
HCT: 39 % (ref 39.0–52.0)
HEMOGLOBIN: 12.7 g/dL — AB (ref 13.0–17.0)
MCH: 29.8 pg (ref 26.0–34.0)
MCHC: 32.6 g/dL (ref 30.0–36.0)
MCV: 91.5 fL (ref 78.0–100.0)
Platelets: 157 10*3/uL (ref 150–400)
RBC: 4.26 MIL/uL (ref 4.22–5.81)
RDW: 13.9 % (ref 11.5–15.5)
WBC: 7.5 10*3/uL (ref 4.0–10.5)

## 2015-07-14 LAB — SURGICAL PCR SCREEN
MRSA, PCR: NEGATIVE
Staphylococcus aureus: NEGATIVE

## 2015-07-14 NOTE — Progress Notes (Signed)
PCP is Dr Allyn Kenner Cardiologist is Dr. Domenic Polite- last saw him 07-01-15 States he was told to stop coumadin 07-23-15

## 2015-07-14 NOTE — Progress Notes (Signed)
Lab called with a critical INR of 5.3. Dr Juel Burrow office called and informed (Spoke with [place]). Ebony Hail also informed.

## 2015-07-14 NOTE — Pre-Procedure Instructions (Signed)
Benjamin Cordova  07/14/2015      [facility] - [place], [place] - [address] [place] [address] Phone: [phone] Fax: [phone]  [place], [place] [address] Phone: [phone] Fax: [phone]    Your procedure is scheduled on  April 26  Report to [facility] at [phone].  Call this number if you have problems the morning of surgery:  [phone]   Remember:  Do not eat food or drink liquids after midnight.  Take these medicines the morning of surgery with A SIP OF WATER atenolol (Tenormin), Hydrocodone (Norco) Stop taking aspirin, Ibuprofen,  Advil, Motrin, BC's, Goody's, Herbal medications, Fish Oil, Aleve,  Stop taking Coumadin as directed by your Dr.   Lazaro Arms not wear jewelry, make-up or nail polish.  Do not wear lotions, powders, or perfumes.  You may wear deodorant.  Do not shave 48 hours prior to surgery.  Men may shave face and neck.  Do not bring valuables to the hospital.  [facility] is not responsible for any belongings or valuables.  Contacts, dentures or bridgework may not be worn into surgery.  Leave your suitcase in the car.  After surgery it may be brought to your room.  For patients admitted to the hospital, discharge time will be determined by your treatment team.  Patients discharged the day of surgery will not be allowed to drive home.   Special instructions:  [facility] - Preparing for Surgery  Before surgery, you can play an important role.  Because skin is not sterile, your skin needs to be as free of germs as possible.  You can reduce the number of germs on you skin by washing with CHG (chlorahexidine gluconate) soap before surgery.  CHG is an antiseptic cleaner which kills germs and bonds with the skin to continue killing germs even after washing.  Please DO NOT use if you have an allergy to CHG or  antibacterial soaps.  If your skin becomes reddened/irritated stop using the CHG and inform your nurse when you arrive at Short Stay.  Do not shave (including legs and underarms) for at least 48 hours prior to the first CHG shower.  You may shave your face.  Please follow these instructions carefully:   1.  Shower with CHG Soap the night before surgery and the                                morning of Surgery.  2.  If you choose to wash your hair, wash your hair first as usual with your       normal shampoo.  3.  After you shampoo, rinse your hair and body thoroughly to remove the                      Shampoo.  4.  Use CHG as you would any other liquid soap.  You can apply chg directly       to the skin and wash gently with scrungie or a clean washcloth.  5.  Apply the CHG Soap to your body ONLY FROM THE NECK DOWN.        Do not use on open wounds or open sores.  Avoid contact with your eyes,       ears, mouth and  genitals (private parts).  Wash genitals (private parts)       with your normal soap.  6.  Wash thoroughly, paying special attention to the area where your surgery        will be performed.  7.  Thoroughly rinse your body with warm water from the neck down.  8.  DO NOT shower/wash with your normal soap after using and rinsing off       the CHG Soap.  9.  Pat yourself dry with a clean towel.            10.  Wear clean pajamas.            11.  Place clean sheets on your bed the night of your first shower and do not        sleep with pets.  Day of Surgery  Do not apply any lotions/deoderants the morning of surgery.  Please wear clean clothes to the hospital/surgery center.     Please read over the following fact sheets that you were given. Pain Booklet, Coughing and Deep Breathing, Blood Transfusion Information, MRSA Information and Surgical Site Infection Prevention

## 2015-07-15 NOTE — Progress Notes (Signed)
Anesthesia Chart Review: Patient is a 81 year old male scheduled for left TKR on 07/27/15 by Dr. Kathryne Hitch.  History includes MI/CAD s/p CABG '81 with redo CABG (LIMA-LAD, left RA-OM2) 10/08/14 (Dr. Roxan Hockey), remote former smoker, HTN, PVD, polio ostoepathy RLE (age 53), TIA/CVA (on warfarin), SOB, headaches, tonsillectomy.   PCP is Dr. Marcy Siren Zall. Cardiologist is Dr. Rozann Lesches, last visit 07/01/15. He felt patient should be able to proceed at acceptable, low to intermediate cardiac risk. Warfarin is followed by Dr. Nevada Crane, but Dr. Domenic Polite did comment that he did not think patient would require a Lovenox bridge.   Meds include atenolol, Benadryl, folilc acid, Norco, lisinopril, Mag-ox, Zocor, warfarin. Patient reported being instructed to hold warfarin starting 07/23/15; however, INR on 07/14/15 was elevated at 5.3. Dr. Antonietta Breach (who manages warfarin) office staff was notified to give patient further instructions.   Vitals: BP 97/50, HR 86, RR 20, T 36.6C, O2 sat 96%.  07/14/15 EKG: NSR, septal infarct (age undetermined), occasional PVC.   10/01/14 Echo: Impressions: - Mild LVH with LVEF 50-55%, wall motion abnormalties as outlined  above consistent with ischemic heart disease. Grade 1 diastolic  dysfunction. Mild mitral regurgitation. Trivial tricuspid  regurgitation with PASP 30 mmHg.  10/05/14 LHC (PRE-REDO CABG):  Prox LAD lesion, 80% stenosed.  Mid LAD lesion, 100% stenosed.  1st Diag-1 lesion, 90% stenosed.  1st Diag-2 lesion, 100% stenosed.  1st Mrg lesion, 100% stenosed.  2nd Mrg lesion, 90% stenosed.  Prox RCA to Mid RCA lesion, 90% stenosed.  Mid RCA to Dist RCA lesion, 100% stenosed.  Acute Mrg lesion, 90% stenosed.  SVG .  Origin lesion, before 1st Diag, 100% stenosed.  Origin lesion, 100% stenosed.  Origin lesion, 100% stenosed.  There is moderate to severe left ventricular systolic dysfunction. 1. Severe 3 vessel obstructive/occlusive  CAD. 2. All grafts are occluded including SVG to diagonal/LAD, SVG to OM1, and SVG to RCA. 3. Moderate to severe LV dysfunction. 4. The LIMA is widely patent as was not used previously for bypass. Recommendation: consider for redo CABG-->S/P redo CABG (LIMA-LAD, L RA-OM2) on 10/08/14.  10/06/14 PFTs: FVC 2.89 (75%), FEV1 2.16 (79%), DLCOunc 20.42 (68%). Minimal obstructive airways disease. Insignificant response to BD. Restrictive process-possible. Minimal diffusion defect.  12/15/14 CXR: Impression: No active cardiopulmonary disease.  Preoperative labs noted. INR as discussed above. STAT PT/INR on arrival.   If no acute changes and if INR acceptable then I would anticipate that he could proceed as planned.  [facility] Short Stay Center/Anesthesiology Phone [phone] 07/15/2015 2:37 PM

## 2015-07-18 DIAGNOSIS — I639 Cerebral infarction, unspecified: Secondary | ICD-10-CM | POA: Diagnosis not present

## 2015-07-22 DIAGNOSIS — I639 Cerebral infarction, unspecified: Secondary | ICD-10-CM | POA: Diagnosis not present

## 2015-07-26 ENCOUNTER — Encounter: Payer: Self-pay | Admitting: Cardiology

## 2015-07-26 MED ORDER — LACTATED RINGERS IV SOLN
INTRAVENOUS | Status: DC
  Administered 2015-07-27: 08:00:00 via INTRAVENOUS

## 2015-07-26 MED ORDER — CEFAZOLIN SODIUM-DEXTROSE 2-4 GM/100ML-% IV SOLN
2.0000 g | INTRAVENOUS | Status: AC
  Administered 2015-07-27: 2 g via INTRAVENOUS
  Filled 2015-07-26: qty 100

## 2015-07-27 ENCOUNTER — Inpatient Hospital Stay (HOSPITAL_COMMUNITY): Payer: Commercial Managed Care - HMO | Admitting: Vascular Surgery

## 2015-07-27 ENCOUNTER — Inpatient Hospital Stay (HOSPITAL_COMMUNITY): Payer: Commercial Managed Care - HMO

## 2015-07-27 ENCOUNTER — Encounter (HOSPITAL_COMMUNITY)
Admission: RE | Disposition: A | Discharge status: Skilled Nursing Facility | Payer: Self-pay | Source: Ambulatory Visit | Attending: Orthopedic Surgery

## 2015-07-27 ENCOUNTER — Encounter (HOSPITAL_COMMUNITY): Payer: Self-pay | Admitting: Surgery

## 2015-07-27 ENCOUNTER — Inpatient Hospital Stay (HOSPITAL_COMMUNITY)
Admission: RE | Admit: 2015-07-27 | Discharge: 2015-08-01 | DRG: 470 | Disposition: A | Discharge status: Skilled Nursing Facility | Payer: Commercial Managed Care - HMO | Source: Ambulatory Visit | Attending: Orthopedic Surgery | Admitting: Orthopedic Surgery

## 2015-07-27 ENCOUNTER — Inpatient Hospital Stay (HOSPITAL_COMMUNITY): Payer: Commercial Managed Care - HMO | Admitting: Anesthesiology

## 2015-07-27 DIAGNOSIS — I480 Paroxysmal atrial fibrillation: Secondary | ICD-10-CM | POA: Diagnosis not present

## 2015-07-27 DIAGNOSIS — Z8612 Personal history of poliomyelitis: Secondary | ICD-10-CM | POA: Diagnosis not present

## 2015-07-27 DIAGNOSIS — R000 Tachycardia, unspecified: Secondary | ICD-10-CM | POA: Diagnosis not present

## 2015-07-27 DIAGNOSIS — I739 Peripheral vascular disease, unspecified: Secondary | ICD-10-CM | POA: Diagnosis present

## 2015-07-27 DIAGNOSIS — I252 Old myocardial infarction: Secondary | ICD-10-CM

## 2015-07-27 DIAGNOSIS — R609 Edema, unspecified: Secondary | ICD-10-CM

## 2015-07-27 DIAGNOSIS — M1712 Unilateral primary osteoarthritis, left knee: Principal | ICD-10-CM | POA: Diagnosis present

## 2015-07-27 DIAGNOSIS — F17290 Nicotine dependence, other tobacco product, uncomplicated: Secondary | ICD-10-CM | POA: Diagnosis present

## 2015-07-27 DIAGNOSIS — I10 Essential (primary) hypertension: Secondary | ICD-10-CM | POA: Diagnosis not present

## 2015-07-27 DIAGNOSIS — R531 Weakness: Secondary | ICD-10-CM | POA: Diagnosis present

## 2015-07-27 DIAGNOSIS — Z951 Presence of aortocoronary bypass graft: Secondary | ICD-10-CM | POA: Diagnosis not present

## 2015-07-27 DIAGNOSIS — I959 Hypotension, unspecified: Secondary | ICD-10-CM | POA: Diagnosis not present

## 2015-07-27 DIAGNOSIS — D649 Anemia, unspecified: Secondary | ICD-10-CM | POA: Diagnosis present

## 2015-07-27 DIAGNOSIS — R262 Difficulty in walking, not elsewhere classified: Secondary | ICD-10-CM | POA: Diagnosis not present

## 2015-07-27 DIAGNOSIS — Z472 Encounter for removal of internal fixation device: Secondary | ICD-10-CM | POA: Diagnosis not present

## 2015-07-27 DIAGNOSIS — R278 Other lack of coordination: Secondary | ICD-10-CM | POA: Diagnosis not present

## 2015-07-27 DIAGNOSIS — Z8673 Personal history of transient ischemic attack (TIA), and cerebral infarction without residual deficits: Secondary | ICD-10-CM

## 2015-07-27 DIAGNOSIS — D696 Thrombocytopenia, unspecified: Secondary | ICD-10-CM | POA: Diagnosis not present

## 2015-07-27 DIAGNOSIS — M6281 Muscle weakness (generalized): Secondary | ICD-10-CM | POA: Diagnosis not present

## 2015-07-27 DIAGNOSIS — Z7901 Long term (current) use of anticoagulants: Secondary | ICD-10-CM

## 2015-07-27 DIAGNOSIS — D62 Acute posthemorrhagic anemia: Secondary | ICD-10-CM | POA: Diagnosis not present

## 2015-07-27 DIAGNOSIS — M179 Osteoarthritis of knee, unspecified: Secondary | ICD-10-CM | POA: Diagnosis not present

## 2015-07-27 DIAGNOSIS — M25569 Pain in unspecified knee: Secondary | ICD-10-CM | POA: Diagnosis not present

## 2015-07-27 DIAGNOSIS — H538 Other visual disturbances: Secondary | ICD-10-CM | POA: Diagnosis not present

## 2015-07-27 DIAGNOSIS — I2510 Atherosclerotic heart disease of native coronary artery without angina pectoris: Secondary | ICD-10-CM | POA: Diagnosis present

## 2015-07-27 DIAGNOSIS — Z96652 Presence of left artificial knee joint: Secondary | ICD-10-CM | POA: Diagnosis not present

## 2015-07-27 DIAGNOSIS — Z96659 Presence of unspecified artificial knee joint: Secondary | ICD-10-CM

## 2015-07-27 DIAGNOSIS — Z471 Aftercare following joint replacement surgery: Secondary | ICD-10-CM | POA: Diagnosis not present

## 2015-07-27 HISTORY — PX: TOTAL KNEE ARTHROPLASTY: SHX125

## 2015-07-27 LAB — CBC
HCT: 36.5 % — ABNORMAL LOW (ref 39.0–52.0)
HEMOGLOBIN: 11.8 g/dL — AB (ref 13.0–17.0)
MCH: 30.2 pg (ref 26.0–34.0)
MCHC: 32.3 g/dL (ref 30.0–36.0)
MCV: 93.4 fL (ref 78.0–100.0)
PLATELETS: 181 10*3/uL (ref 150–400)
RBC: 3.91 MIL/uL — AB (ref 4.22–5.81)
RDW: 13.7 % (ref 11.5–15.5)
WBC: 14.7 10*3/uL — AB (ref 4.0–10.5)

## 2015-07-27 LAB — PROTIME-INR
INR: 1.1 (ref 0.00–1.49)
PROTHROMBIN TIME: 14.4 s (ref 11.6–15.2)

## 2015-07-27 LAB — CREATININE, SERUM
Creatinine, Ser: 0.91 mg/dL (ref 0.61–1.24)
GFR calc non Af Amer: >60 mL/min (ref >60)

## 2015-07-27 SURGERY — ARTHROPLASTY, KNEE, TOTAL
Anesthesia: General | Site: Knee | Laterality: Left

## 2015-07-27 MED ORDER — BUPIVACAINE HCL (PF) 0.5 % IJ SOLN
INTRAMUSCULAR | Status: AC
Start: 2015-07-27 — End: 2015-07-27
  Filled 2015-07-27: qty 30

## 2015-07-27 MED ORDER — ATENOLOL 50 MG PO TABS
50.0000 mg | ORAL_TABLET | Freq: Every day | ORAL | Status: DC
  Administered 2015-07-28 – 2015-07-31 (×4): 50 mg via ORAL
  Filled 2015-07-27 (×6): qty 1

## 2015-07-27 MED ORDER — ENOXAPARIN SODIUM 30 MG/0.3ML ~~LOC~~ SOLN
30.0000 mg | Freq: Two times a day (BID) | SUBCUTANEOUS | Status: DC
  Administered 2015-07-28 – 2015-08-01 (×9): 30 mg via SUBCUTANEOUS
  Filled 2015-07-27 (×9): qty 0.3

## 2015-07-27 MED ORDER — SODIUM CHLORIDE 0.9 % IJ SOLN
INTRAMUSCULAR | Status: DC | PRN
  Administered 2015-07-27: 40 mL via INTRAVENOUS

## 2015-07-27 MED ORDER — POLYETHYLENE GLYCOL 3350 17 G PO PACK
17.0000 g | PACK | Freq: Every day | ORAL | Status: DC | PRN
Start: 2015-07-27 — End: 2015-08-01

## 2015-07-27 MED ORDER — WARFARIN SODIUM 5 MG PO TABS: 10.0000 mg | ORAL_TABLET | Freq: Every day | ORAL | Status: DC

## 2015-07-27 MED ORDER — MAGNESIUM CITRATE PO SOLN: 1.0000 | Freq: Once | ORAL | Status: DC | PRN

## 2015-07-27 MED ORDER — CEFAZOLIN SODIUM-DEXTROSE 2-4 GM/100ML-% IV SOLN
2.0000 g | Freq: Four times a day (QID) | INTRAVENOUS | Status: AC
  Administered 2015-07-27 (×2): 2 g via INTRAVENOUS
  Filled 2015-07-27 (×2): qty 100

## 2015-07-27 MED ORDER — FENTANYL CITRATE (PF) 250 MCG/5ML IJ SOLN
INTRAMUSCULAR | Status: AC
  Filled 2015-07-27: qty 5

## 2015-07-27 MED ORDER — DIPHENHYDRAMINE HCL 12.5 MG/5ML PO ELIX: 12.5000 mg | ORAL_SOLUTION | ORAL | Status: DC | PRN

## 2015-07-27 MED ORDER — FENTANYL CITRATE (PF) 100 MCG/2ML IJ SOLN
INTRAMUSCULAR | Status: AC
  Filled 2015-07-27: qty 2

## 2015-07-27 MED ORDER — SUCCINYLCHOLINE CHLORIDE 20 MG/ML IJ SOLN
INTRAMUSCULAR | Status: AC
Start: 2015-07-27 — End: 2015-07-27
  Filled 2015-07-27: qty 1

## 2015-07-27 MED ORDER — BUPIVACAINE LIPOSOME 1.3 % IJ SUSP
20.0000 mL | INTRAMUSCULAR | Status: DC
  Filled 2015-07-27: qty 20

## 2015-07-27 MED ORDER — ENOXAPARIN SODIUM 30 MG/0.3ML ~~LOC~~ SOLN: SUBCUTANEOUS | Status: DC

## 2015-07-27 MED ORDER — ROCURONIUM BROMIDE 100 MG/10ML IV SOLN
INTRAVENOUS | Status: DC | PRN
  Administered 2015-07-27: 50 mg via INTRAVENOUS

## 2015-07-27 MED ORDER — SODIUM CHLORIDE 0.9 % IR SOLN
Status: DC | PRN
  Administered 2015-07-27: 1000 mL

## 2015-07-27 MED ORDER — METOCLOPRAMIDE HCL 5 MG/ML IJ SOLN: 5.0000 mg | Freq: Three times a day (TID) | INTRAMUSCULAR | Status: DC | PRN

## 2015-07-27 MED ORDER — OXYCODONE-ACETAMINOPHEN 5-325 MG PO TABS: 1.0000 | ORAL_TABLET | ORAL | Status: DC | PRN

## 2015-07-27 MED ORDER — PROPOFOL 10 MG/ML IV BOLUS
INTRAVENOUS | Status: DC | PRN
  Administered 2015-07-27: 150 mg via INTRAVENOUS

## 2015-07-27 MED ORDER — BUPIVACAINE HCL 0.5 % IJ SOLN
INTRAMUSCULAR | Status: DC | PRN
  Administered 2015-07-27: 10 mL

## 2015-07-27 MED ORDER — METHOCARBAMOL 1000 MG/10ML IJ SOLN
500.0000 mg | Freq: Four times a day (QID) | INTRAVENOUS | Status: DC | PRN
  Filled 2015-07-27: qty 5

## 2015-07-27 MED ORDER — METHOCARBAMOL 500 MG PO TABS
500.0000 mg | ORAL_TABLET | Freq: Four times a day (QID) | ORAL | Status: DC | PRN
  Administered 2015-07-28 – 2015-07-29 (×2): 500 mg via ORAL
  Filled 2015-07-27 (×2): qty 1

## 2015-07-27 MED ORDER — DOCUSATE SODIUM 100 MG PO CAPS
100.0000 mg | ORAL_CAPSULE | Freq: Two times a day (BID) | ORAL | Status: DC
  Administered 2015-07-28 – 2015-08-01 (×9): 100 mg via ORAL
  Filled 2015-07-27 (×12): qty 1

## 2015-07-27 MED ORDER — SUGAMMADEX SODIUM 500 MG/5ML IV SOLN
INTRAVENOUS | Status: DC | PRN
  Administered 2015-07-27: 206.8 mg via INTRAVENOUS

## 2015-07-27 MED ORDER — LIDOCAINE HCL (CARDIAC) 20 MG/ML IV SOLN
INTRAVENOUS | Status: DC | PRN
  Administered 2015-07-27: 50 mg via INTRAVENOUS

## 2015-07-27 MED ORDER — ONDANSETRON HCL 4 MG/2ML IJ SOLN: 4.0000 mg | Freq: Four times a day (QID) | INTRAMUSCULAR | Status: DC | PRN

## 2015-07-27 MED ORDER — ROCURONIUM BROMIDE 50 MG/5ML IV SOLN
INTRAVENOUS | Status: AC
  Filled 2015-07-27: qty 1

## 2015-07-27 MED ORDER — BISACODYL 10 MG RE SUPP
10.0000 mg | Freq: Every day | RECTAL | Status: DC | PRN
  Administered 2015-07-31: 10 mg via RECTAL
  Filled 2015-07-27: qty 1

## 2015-07-27 MED ORDER — EPHEDRINE SULFATE 50 MG/ML IJ SOLN
INTRAMUSCULAR | Status: AC
  Filled 2015-07-27: qty 1

## 2015-07-27 MED ORDER — ONDANSETRON HCL 4 MG PO TABS: 4.0000 mg | ORAL_TABLET | Freq: Three times a day (TID) | ORAL | Status: DC | PRN

## 2015-07-27 MED ORDER — ASPIRIN EC 81 MG PO TBEC: 81.0000 mg | DELAYED_RELEASE_TABLET | Freq: Every day | ORAL | Status: DC

## 2015-07-27 MED ORDER — WARFARIN - PHYSICIAN DOSING INPATIENT: Freq: Every day | Status: DC

## 2015-07-27 MED ORDER — OXYCODONE HCL 5 MG PO TABS
5.0000 mg | ORAL_TABLET | ORAL | Status: DC | PRN
  Administered 2015-07-27 – 2015-08-01 (×24): 10 mg via ORAL
  Filled 2015-07-27 (×24): qty 2

## 2015-07-27 MED ORDER — PHENYLEPHRINE 40 MCG/ML (10ML) SYRINGE FOR IV PUSH (FOR BLOOD PRESSURE SUPPORT)
PREFILLED_SYRINGE | INTRAVENOUS | Status: AC
  Filled 2015-07-27: qty 20

## 2015-07-27 MED ORDER — DIAZEPAM 2 MG PO TABS: 2.0000 mg | ORAL_TABLET | Freq: Three times a day (TID) | ORAL | Status: DC | PRN

## 2015-07-27 MED ORDER — MEPERIDINE HCL 25 MG/ML IJ SOLN: 6.2500 mg | INTRAMUSCULAR | Status: DC | PRN

## 2015-07-27 MED ORDER — GLYCOPYRROLATE 0.2 MG/ML IJ SOLN
INTRAMUSCULAR | Status: AC
  Filled 2015-07-27: qty 1

## 2015-07-27 MED ORDER — PHENYLEPHRINE HCL 10 MG/ML IJ SOLN
10.0000 mg | INTRAVENOUS | Status: DC | PRN
  Administered 2015-07-27: 50 ug/min via INTRAVENOUS

## 2015-07-27 MED ORDER — METOCLOPRAMIDE HCL 5 MG/ML IJ SOLN: 10.0000 mg | Freq: Once | INTRAMUSCULAR | Status: DC | PRN

## 2015-07-27 MED ORDER — CHLORHEXIDINE GLUCONATE 4 % EX LIQD: 60.0000 mL | Freq: Once | CUTANEOUS | Status: DC

## 2015-07-27 MED ORDER — FENTANYL CITRATE (PF) 100 MCG/2ML IJ SOLN
25.0000 ug | INTRAMUSCULAR | Status: DC | PRN
  Administered 2015-07-27 (×3): 50 ug via INTRAVENOUS

## 2015-07-27 MED ORDER — MAGNESIUM OXIDE 400 (241.3 MG) MG PO TABS
400.0000 mg | ORAL_TABLET | Freq: Every day | ORAL | Status: DC
  Administered 2015-07-27 – 2015-07-31 (×5): 400 mg via ORAL
  Filled 2015-07-27 (×5): qty 1

## 2015-07-27 MED ORDER — ALUM & MAG HYDROXIDE-SIMETH 200-200-20 MG/5ML PO SUSP: 30.0000 mL | ORAL | Status: DC | PRN

## 2015-07-27 MED ORDER — ONDANSETRON HCL 4 MG PO TABS: 4.0000 mg | ORAL_TABLET | Freq: Four times a day (QID) | ORAL | Status: DC | PRN

## 2015-07-27 MED ORDER — SODIUM CHLORIDE 0.9 % IV BOLUS (SEPSIS)
500.0000 mL | Freq: Once | INTRAVENOUS | Status: AC
  Administered 2015-07-27: 500 mL via INTRAVENOUS

## 2015-07-27 MED ORDER — PHENYLEPHRINE HCL 10 MG/ML IJ SOLN
INTRAMUSCULAR | Status: DC | PRN
  Administered 2015-07-27 (×3): 120 ug via INTRAVENOUS

## 2015-07-27 MED ORDER — HYDROMORPHONE HCL 1 MG/ML IJ SOLN
0.5000 mg | INTRAMUSCULAR | Status: DC | PRN
  Administered 2015-07-27: 1 mg via INTRAVENOUS
  Filled 2015-07-27: qty 1

## 2015-07-27 MED ORDER — PROPOFOL 10 MG/ML IV BOLUS
INTRAVENOUS | Status: AC
  Filled 2015-07-27: qty 40

## 2015-07-27 MED ORDER — ASPIRIN EC 325 MG PO TBEC
325.0000 mg | DELAYED_RELEASE_TABLET | Freq: Once | ORAL | Status: AC
  Administered 2015-07-27: 325 mg via ORAL
  Filled 2015-07-27: qty 1

## 2015-07-27 MED ORDER — LISINOPRIL 20 MG PO TABS
20.0000 mg | ORAL_TABLET | Freq: Every day | ORAL | Status: DC
  Administered 2015-07-30 – 2015-07-31 (×2): 20 mg via ORAL
  Filled 2015-07-27 (×3): qty 1

## 2015-07-27 MED ORDER — POTASSIUM CHLORIDE IN NACL 20-0.9 MEQ/L-% IV SOLN
INTRAVENOUS | Status: DC
  Administered 2015-07-27 – 2015-07-30 (×4): via INTRAVENOUS
  Filled 2015-07-27 (×4): qty 1000

## 2015-07-27 MED ORDER — ATORVASTATIN CALCIUM 40 MG PO TABS
40.0000 mg | ORAL_TABLET | Freq: Every day | ORAL | Status: DC
  Administered 2015-07-27 – 2015-08-01 (×5): 40 mg via ORAL
  Filled 2015-07-27 (×6): qty 1

## 2015-07-27 MED ORDER — FENTANYL CITRATE (PF) 100 MCG/2ML IJ SOLN
INTRAMUSCULAR | Status: DC | PRN
  Administered 2015-07-27 (×2): 100 ug via INTRAVENOUS
  Administered 2015-07-27: 50 ug via INTRAVENOUS

## 2015-07-27 MED ORDER — PHENOL 1.4 % MT LIQD: 1.0000 | OROMUCOSAL | Status: DC | PRN

## 2015-07-27 MED ORDER — METOCLOPRAMIDE HCL 5 MG PO TABS: 5.0000 mg | ORAL_TABLET | Freq: Three times a day (TID) | ORAL | Status: DC | PRN

## 2015-07-27 MED ORDER — ACETAMINOPHEN 650 MG RE SUPP: 650.0000 mg | Freq: Four times a day (QID) | RECTAL | Status: DC | PRN

## 2015-07-27 MED ORDER — BUPIVACAINE LIPOSOME 1.3 % IJ SUSP
INTRAMUSCULAR | Status: DC | PRN
  Administered 2015-07-27: 20 mL

## 2015-07-27 MED ORDER — ACETAMINOPHEN 325 MG PO TABS
650.0000 mg | ORAL_TABLET | Freq: Four times a day (QID) | ORAL | Status: DC | PRN
  Administered 2015-07-27 (×2): 650 mg via ORAL
  Filled 2015-07-27 (×3): qty 2

## 2015-07-27 MED ORDER — SUGAMMADEX SODIUM 500 MG/5ML IV SOLN
INTRAVENOUS | Status: AC
  Filled 2015-07-27: qty 5

## 2015-07-27 MED ORDER — LACTATED RINGERS IV SOLN
INTRAVENOUS | Status: DC | PRN
  Administered 2015-07-27 (×2): via INTRAVENOUS

## 2015-07-27 MED ORDER — MENTHOL 3 MG MT LOZG: 1.0000 | LOZENGE | OROMUCOSAL | Status: DC | PRN

## 2015-07-27 SURGICAL SUPPLY — 74 items
APL SKNCLS STERI-STRIP NONHPOA (GAUZE/BANDAGES/DRESSINGS) ×1
BANDAGE ACE 4X5 VEL STRL LF (GAUZE/BANDAGES/DRESSINGS) ×2 IMPLANT
BANDAGE ACE 6X5 VEL STRL LF (GAUZE/BANDAGES/DRESSINGS) ×2 IMPLANT
BANDAGE ELASTIC 4 VELCRO ST LF (GAUZE/BANDAGES/DRESSINGS) ×3 IMPLANT
BANDAGE ELASTIC 6 VELCRO ST LF (GAUZE/BANDAGES/DRESSINGS) ×5 IMPLANT
BANDAGE ESMARK 6X9 LF (GAUZE/BANDAGES/DRESSINGS) ×1 IMPLANT
BENZOIN TINCTURE PRP APPL 2/3 (GAUZE/BANDAGES/DRESSINGS) ×3 IMPLANT
BLADE SAG 18X100X1.27 (BLADE) ×6 IMPLANT
BNDG CMPR 9X6 STRL LF SNTH (GAUZE/BANDAGES/DRESSINGS) ×1
BNDG ESMARK 6X9 LF (GAUZE/BANDAGES/DRESSINGS) ×3
BOWL SMART MIX CTS (DISPOSABLE) ×3 IMPLANT
CAPT KNEE TOTAL 3 ×2 IMPLANT
CEMENT BONE SIMPLEX SPEEDSET (Cement) ×6 IMPLANT
CLOSURE WOUND 1/2 X4 (GAUZE/BANDAGES/DRESSINGS) ×2
COVER SURGICAL LIGHT HANDLE (MISCELLANEOUS) ×3 IMPLANT
CUFF TOURNIQUET SINGLE 34IN LL (TOURNIQUET CUFF) ×3 IMPLANT
DRAPE EXTREMITY T 121X128X90 (DRAPE) ×3 IMPLANT
DRAPE IMP U-DRAPE 54X76 (DRAPES) ×3 IMPLANT
DRAPE PROXIMA HALF (DRAPES) ×3 IMPLANT
DRAPE U-SHAPE 47X51 STRL (DRAPES) ×3 IMPLANT
DRSG ADAPTIC 3X8 NADH LF (GAUZE/BANDAGES/DRESSINGS) ×2 IMPLANT
DRSG PAD ABDOMINAL 8X10 ST (GAUZE/BANDAGES/DRESSINGS) ×3 IMPLANT
DURAPREP 26ML APPLICATOR (WOUND CARE) ×6 IMPLANT
ELECT CAUTERY BLADE 6.4 (BLADE) ×3 IMPLANT
ELECT REM PT RETURN 9FT ADLT (ELECTROSURGICAL) ×3
ELECTRODE REM PT RTRN 9FT ADLT (ELECTROSURGICAL) ×1 IMPLANT
EVACUATOR 1/8 PVC DRAIN (DRAIN) ×3 IMPLANT
FACESHIELD WRAPAROUND (MASK) ×6 IMPLANT
FACESHIELD WRAPAROUND OR TEAM (MASK) ×2 IMPLANT
GAUZE SPONGE 4X4 12PLY STRL (GAUZE/BANDAGES/DRESSINGS) ×3 IMPLANT
GLOVE BIOGEL PI IND STRL 7.0 (GLOVE) ×1 IMPLANT
GLOVE BIOGEL PI INDICATOR 7.0 (GLOVE) ×2
GLOVE ORTHO TXT STRL SZ7.5 (GLOVE) ×3 IMPLANT
GLOVE SURG ORTHO 7.0 STRL STRW (GLOVE) ×3 IMPLANT
GOWN STRL REUS W/ TWL LRG LVL3 (GOWN DISPOSABLE) ×2 IMPLANT
GOWN STRL REUS W/ TWL XL LVL3 (GOWN DISPOSABLE) ×1 IMPLANT
GOWN STRL REUS W/TWL LRG LVL3 (GOWN DISPOSABLE) ×6
GOWN STRL REUS W/TWL XL LVL3 (GOWN DISPOSABLE) ×3
HANDPIECE INTERPULSE COAX TIP (DISPOSABLE) ×3
IMMOBILIZER KNEE 22 UNIV (SOFTGOODS) ×3 IMPLANT
IMMOBILIZER KNEE 24 THIGH 36 (MISCELLANEOUS) IMPLANT
IMMOBILIZER KNEE 24 UNIV (MISCELLANEOUS)
KIT BASIN OR (CUSTOM PROCEDURE TRAY) ×3 IMPLANT
KIT ROOM TURNOVER OR (KITS) ×3 IMPLANT
MANIFOLD NEPTUNE II (INSTRUMENTS) ×3 IMPLANT
NDL 18GX1X1/2 (RX/OR ONLY) (NEEDLE) ×1 IMPLANT
NDL HYPO 25GX1X1/2 BEV (NEEDLE) ×1 IMPLANT
NEEDLE 18GX1X1/2 (RX/OR ONLY) (NEEDLE) ×3 IMPLANT
NEEDLE HYPO 25GX1X1/2 BEV (NEEDLE) ×3 IMPLANT
NS IRRIG 1000ML POUR BTL (IV SOLUTION) ×3 IMPLANT
PACK TOTAL JOINT (CUSTOM PROCEDURE TRAY) ×3 IMPLANT
PACK UNIVERSAL I (CUSTOM PROCEDURE TRAY) ×3 IMPLANT
PAD ABD 8X10 STRL (GAUZE/BANDAGES/DRESSINGS) ×2 IMPLANT
PAD ARMBOARD 7.5X6 YLW CONV (MISCELLANEOUS) ×6 IMPLANT
PAD CAST 4YDX4 CTTN HI CHSV (CAST SUPPLIES) ×1 IMPLANT
PADDING CAST COTTON 4X4 STRL (CAST SUPPLIES) ×3
PADDING CAST COTTON 6X4 STRL (CAST SUPPLIES) ×2 IMPLANT
SET HNDPC FAN SPRY TIP SCT (DISPOSABLE) ×1 IMPLANT
SPONGE GAUZE 4X4 12PLY STER LF (GAUZE/BANDAGES/DRESSINGS) ×2 IMPLANT
STRIP CLOSURE SKIN 1/2X4 (GAUZE/BANDAGES/DRESSINGS) ×4 IMPLANT
SUCTION FRAZIER HANDLE 10FR (MISCELLANEOUS) ×2
SUCTION TUBE FRAZIER 10FR DISP (MISCELLANEOUS) ×1 IMPLANT
SUT MNCRL AB 4-0 PS2 18 (SUTURE) ×3 IMPLANT
SUT VIC AB 0 CT1 27 (SUTURE) ×6
SUT VIC AB 0 CT1 27XBRD ANBCTR (SUTURE) IMPLANT
SUT VIC AB 1 CTX 36 (SUTURE) ×6
SUT VIC AB 1 CTX36XBRD ANBCTR (SUTURE) ×1 IMPLANT
SUT VIC AB 2-0 CT1 27 (SUTURE) ×6
SUT VIC AB 2-0 CT1 TAPERPNT 27 (SUTURE) ×2 IMPLANT
SYR 50ML LL SCALE MARK (SYRINGE) ×3 IMPLANT
SYR CONTROL 10ML LL (SYRINGE) ×3 IMPLANT
TOWEL OR 17X24 6PK STRL BLUE (TOWEL DISPOSABLE) ×3 IMPLANT
TOWEL OR 17X26 10 PK STRL BLUE (TOWEL DISPOSABLE) ×3 IMPLANT
WATER STERILE IRR 1000ML POUR (IV SOLUTION) ×3 IMPLANT

## 2015-07-27 NOTE — Discharge Summary (Addendum)
Patient ID: Benjamin Cordova MRN: [id number] DOB/AGE: [birth year] 79 y.o.  Admit date: 07/27/2015 Discharge date: 08/01/2015  Admission Diagnoses:  Principal Problem:   Blurred vision Active Problems:   Hypertension   Paroxysmal atrial fibrillation (HCC)   S/P total knee replacement   Anemia   Discharge Diagnoses:  Same  Past Medical History  Diagnosis Date  . Coronary artery disease     Multivessel s/p CABG [facility] at [facility]  . Essential hypertension   . Myocardial infarction ([place]) 1980  . Arthritis   . Hematuria   . Bladder infection, acute   . Peripheral vascular disease ([place])   . 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 TIA (transient ischemic attack)     Chronic coumadin  . Stroke ([place])   . Shortness of breath dyspnea   . Pneumonia   . Headache     Surgeries: Procedure(s): TOTAL LEFT KNEE ARTHROPLASTY on 07/27/2015   Consultants:  medicine  Discharged Condition: Improved  Hospital Course: Benjamin Cordova is an 81 y.o. male who was admitted 07/27/2015 for operative treatment of left knee osteoarthritis. Patient has severe unremitting pain that affects sleep, daily activities, and work/hobbies. After pre-op clearance the patient was taken to the operating room on 07/27/2015 and underwent  Procedure(s): TOTAL LEFT KNEE ARTHROPLASTY.  Patient with a  Pre-op Hb of 12.7 developed ABLA on pod#1 with a Hb of 9.1 which trended down over the next few days to a Hb of 7.5.  He was transfused with 2 units of prbc.  Benjamin Cordova also had 3 episodes of blurry vision all of which were resolved with nsaids.  Medicine was consulted and worked up for cva which was negative.    Patient was given perioperative antibiotics:      Anti-infectives    Start     Dose/Rate Route Frequency Ordered Stop   07/27/15 1600  ceFAZolin (ANCEF) IVPB 2g/100 mL premix     2 g 200 mL/hr over 30 Minutes Intravenous Every 6 hours 07/27/15 1402 07/27/15 2146   07/27/15 0930  ceFAZolin  (ANCEF) IVPB 2g/100 mL premix     2 g 200 mL/hr over 30 Minutes Intravenous To ShortStay Surgical 07/26/15 1253 07/27/15 1044       Patient was given sequential compression devices, early ambulation, and chemoprophylaxis to prevent DVT.  Patient benefited maximally from hospital stay and there were no complications.    Recent vital signs:  Patient Vitals for the past 24 hrs:  BP Temp Temp src Pulse Resp SpO2  08/01/15 0417 111/63 mmHg 98.8 F (37.1 C) Oral 80 16 100 %  07/31/15 2122 (!) 78/32 mmHg 98.6 F (37 C) Oral 77 16 96 %  07/31/15 1325 103/62 mmHg 98.7 F (37.1 C) Oral 98 18 98 %  07/31/15 1027 131/64 mmHg - - (!) 111 - 91 %     Recent laboratory studies:   Recent Labs  07/30/15 0220 07/31/15 0030 07/31/15 0315 08/01/15 0345  WBC 8.3  --  8.2  --   HGB 7.5* 8.6* 9.2*  --   HCT 24.0* 26.7* 27.8*  --   PLT 106*  --  121*  --   NA 137  --  137  --   K 4.2  --  4.1  --   CL 105  --  104  --   CO2 22  --  25  --   BUN 23*  --  29*  --  CREATININE 0.96  --  1.05  --   GLUCOSE 118*  --  106*  --   INR 1.41  --  1.37 1.54*  CALCIUM 8.4*  --  8.2*  --      Discharge Medications:     Medication List    STOP taking these medications        diphenhydrAMINE 25 MG tablet  Commonly known as:  BENADRYL     HYDROcodone-acetaminophen 5-325 MG tablet  Commonly known as:  NORCO/VICODIN     ibuprofen 200 MG tablet  Commonly known as:  ADVIL,MOTRIN      TAKE these medications        atenolol 50 MG tablet  Commonly known as:  TENORMIN  Take 1 tablet (50 mg total) by mouth daily.     diazepam 2 MG tablet  Commonly known as:  VALIUM  Take 1 tablet (2 mg total) by mouth every 8 (eight) hours as needed for muscle spasms.     enoxaparin 30 MG/0.3ML injection  Commonly known as:  LOVENOX  Inject as directed q12 hours until coumadin levels are therapeutic     folic acid [id number] MCG tablet  Commonly known as:  FOLVITE  Take 800 mcg by mouth daily.     lisinopril  20 MG tablet  Commonly known as:  PRINIVIL,ZESTRIL  Take 1 tablet by mouth daily.     magnesium oxide 400 MG tablet  Commonly known as:  MAG-OX  Take 400 mg by mouth at bedtime.     MULTIVITAMIN PO  Take 1 tablet by mouth daily.     ondansetron 4 MG tablet  Commonly known as:  ZOFRAN  Take 1 tablet (4 mg total) by mouth every 8 (eight) hours as needed for nausea or vomiting.     OVER THE COUNTER MEDICATION  Take 1 tablet by mouth at bedtime.     oxyCODONE-acetaminophen 5-325 MG tablet  Commonly known as:  ROXICET  Take 1-2 tablets by mouth every 4 (four) hours as needed.     simvastatin 80 MG tablet  Commonly known as:  ZOCOR  Take 1 tablet by mouth daily.     SUPER B-COMPLEX/VIT C/FA Tabs  Take 1 tablet by mouth daily.     vitamin C 500 MG tablet  Commonly known as:  ASCORBIC ACID  Take 500 mg by mouth daily.     warfarin 10 MG tablet  Commonly known as:  COUMADIN  Take 10 mg by mouth daily. OR AS DIRECTED        Diagnostic Studies: Ct Head Wo Contrast  07/29/2015  CLINICAL DATA:  Blurred vision EXAM: CT HEAD WITHOUT CONTRAST TECHNIQUE: Contiguous axial images were obtained from the base of the skull through the vertex without intravenous contrast. COMPARISON:  None. FINDINGS: Brain: No intracranial hemorrhage, mass effect or midline shift. There is large area of encephalomalacia in right anterior temporal and right frontal lobe most likely from previous infarct measures at least 5 cm. Mild cerebral atrophy. Mild periventricular white matter decreased attenuation probable due to chronic small vessel ischemic changes. No definite acute cortical infarction. No mass lesion is noted on this unenhanced scan. Vascular: Atherosclerotic calcifications of carotid siphon. Skull: No skull fracture is noted. Sinuses/Orbits: There is probable mucous retention cyst medial aspect of the left maxillary sinus measures mild 9 mm. The mastoid air cells are unremarkable. Other: None IMPRESSION:  No acute intracranial abnormality. There is encephalomalacia in right temporal and right frontal lobe probable from prior  infarct. Mild cerebral atrophy. Mild periventricular and patchy subcortical white matter decreased attenuation probable due to chronic small vessel ischemic changes. No definite acute cortical infarction. Electronically Signed   By: Lahoma Crocker M.D.   On: 07/29/2015 09:56   Dg Knee Left Port  07/27/2015  CLINICAL DATA:  Status post left knee replacement EXAM: PORTABLE LEFT KNEE - 1-2 VIEW COMPARISON:  06/14/2014 FINDINGS: Left knee replacement is noted. Air is noted in surgical bed. No acute bony abnormality is seen. IMPRESSION: Status post left knee replacement. Electronically Signed   By: Inez Catalina M.D.   On: 07/27/2015 13:07    Disposition: 01-Home or Self Care    Follow-up Information    Follow up with [name], MD. Schedule an appointment as soon as possible for a visit in 2 weeks.   Specialty:  Orthopedic Surgery   Contact information:   [place] [address] [phone]       Follow up with [facility] .   Specialty:  [place] information:   [address] [place] [phone]       Signed: Fannie Knee 08/01/2015, 9:06 AM

## 2015-07-27 NOTE — Op Note (Signed)
NAME:  Benjamin Cordova, Benjamin Cordova NO.:  [id number]  MEDICAL RECORD NO.:  [id number]  LOCATION:  [facility]                         FACILITY:  [facility]  PHYSICIAN:  [name], M.D. DATE OF BIRTH:  07-Nov-1934  DATE OF PROCEDURE: DATE OF DISCHARGE:                              OPERATIVE REPORT   PREOPERATIVE DIAGNOSIS:  Left knee end-stage arthritis, primary localized.  Significant bony destruction, medial compartment.  10 degree flexion contracture and 5 degrees of varus.  POSTOPERATIVE DIAGNOSIS:  Left knee end-stage arthritis, primary localized.  Significant bony destruction, medial compartment.  10 degree flexion contracture and 5 degrees of varus.  PROCEDURE:  Left knee modified minimally invasive total knee replacement utilizing Stryker triathlon prosthesis.  A cemented pegged posterior stabilized #6 femoral component.  Cemented #7 tibial component, 9 mm PS insert.  Cemented resurfacing 35-mm patellar component.  SURGEON:  [name], MD  ASSISTANT:  Elmyra Ricks, PA, and present throughout the entire case and necessary for timely completion of procedure.  ANESTHESIA:  General.  BLOOD LOSS:  Minimal.  SPECIMENS:  None.  CULTURES:  None.  COMPLICATIONS:  None.  DRESSINGS:  Soft compressive knee immobilizer.  TOURNIQUET TIME:  45 minutes.  PROCEDURE IN DETAIL:  The patient was brought to operating room and after adequate anesthesia had been obtained, tourniquet applied. Prepped and draped in usual sterile fashion.  Exsanguinated with elevation of Esmarch.  Tourniquet inflated to 350 mmHg.  10 degree flexion contracture fixed, 5 degrees of varus.  Flexion almost 90. Anterior approach straight incision.  Medial arthrotomy vastus splitting.  Distal femur exposed.  10 mm resection, 5 degrees of valgus, flexible intramedullary guide.  Using epicondylar axis, the femur was sized, cut and fitted for a pegged #6 posterior stabilized femoral component.   Proximal tibial resection with extramedullary guide below the defect medially.  Probable loose fragments of bone medial side removed.  I was able to get down to reasonable surface staying above the fibular head.  Sized to #7 component.  Some sclerotic bone on the medial side treated with multiple drilling.  Patella exposed.  Posterior 10 mm removed.  Drilled, sized, and fitted for a 35 mm pegged medial offset component.  Utilizing trials, rotation set on the tibia, it was hand reamed.  All trials removed.  Copious irrigation with pulse irrigating device.  Cement prepared, placed on all components, firmly seated. Polyethylene attached to tibia, a 9-mm insert.  Knee reduced.  Patella held with a clamp.  Once the cement hardened, the knee was injected with Exparel.  Very pleased with full extension, good stability, nicely balanced knee, good patellar tracking.  The arthrotomy closed with #1 Vicryl, subcutaneous and subcuticular closure.  Margins were injected with Marcaine.  Sterile compressive dressing applied.  Tourniquet deflated removed.  Knee immobilizer applied.  Anesthesia reversed. Brought to the recovery room.  Tolerated the surgery well, no complications.     [name], M.D.     DFM/MEDQ  D:  07/27/2015  T:  07/27/2015  Job:  [id number]

## 2015-07-27 NOTE — Anesthesia Preprocedure Evaluation (Addendum)
Anesthesia Evaluation  Patient identified by MRN, date of birth, ID band Patient awake    Reviewed: Allergy & Precautions, NPO status , Patient's Chart, lab work & pertinent test results  Airway Mallampati: II  TM Distance: >3 FB Neck ROM: Full    Dental no notable dental hx. (+) Upper Dentures, Lower Dentures   Pulmonary former smoker,    Pulmonary exam normal breath sounds clear to auscultation       Cardiovascular hypertension, + CAD and + CABG (1981 and redo 2016)  Normal cardiovascular exam+ dysrhythmias Atrial Fibrillation  Rhythm:Regular Rate:Normal     Neuro/Psych TIACVA negative neurological ROS  negative psych ROS   GI/Hepatic negative GI ROS, Neg liver ROS,   Endo/Other  negative endocrine ROS  Renal/GU negative Renal ROS  negative genitourinary   Musculoskeletal negative musculoskeletal ROS (+)   Abdominal   Peds negative pediatric ROS (+)  Hematology negative hematology ROS (+)   Anesthesia Other Findings   Reproductive/Obstetrics negative OB ROS                          Anesthesia Physical Anesthesia Plan  ASA: III  Anesthesia Plan: General   Post-op Pain Management:    Induction: Intravenous  Airway Management Planned: Oral ETT  Additional Equipment:   Intra-op Plan:   Post-operative Plan: Extubation in OR  Informed Consent: I have reviewed the patients History and Physical, chart, labs and discussed the procedure including the risks, benefits and alternatives for the proposed anesthesia with the patient or authorized representative who has indicated his/her understanding and acceptance.   Dental advisory given  Plan Discussed with: CRNA  Anesthesia Plan Comments:         Anesthesia Quick Evaluation

## 2015-07-27 NOTE — Progress Notes (Signed)
Orthopedic Tech Progress Note Patient Details:  Benjamin Cordova 1935/01/25 [id number]  CPM Left Knee CPM Left Knee: On Left Knee Flexion (Degrees): 90 Left Knee Extension (Degrees): 0 Additional Comments: trapeze bar patient helper Viewed order from doctor's order list  Hildred Priest 07/27/2015, 1:42 PM

## 2015-07-27 NOTE — Evaluation (Signed)
Physical Therapy Evaluation Patient Details Name: GARDY LETIZIA MRN: [id number] DOB: 1934/04/28 Today's Date: 07/27/2015   History of Present Illness  Pt is 81 yo male with left TKA, PMH: MI, CVA, polio age 20, affected RLE, OA  Clinical Impression  Pt is s/p TKA resulting in the deficits listed below (see PT Problem List). Pt transferred bed to chair with min A, became dizzy, BP 80/44, session terminated with pt in chair.  Pt will benefit from skilled PT to increase their independence and safety with mobility to allow discharge to the venue listed below.      Follow Up Recommendations SNF    Equipment Recommendations  None recommended by PT    Recommendations for Other Services       Precautions / Restrictions Precautions Precautions: Knee Precaution Booklet Issued: No Precaution Comments: reviewed proper positioning and precautions as well as use of zero knee foam and CPM Required Braces or Orthoses: Knee Immobilizer - Left Knee Immobilizer - Left: Other (comment) (not specified) Restrictions Weight Bearing Restrictions: Yes LLE Weight Bearing: Weight bearing as tolerated      Mobility  Bed Mobility Overal bed mobility: Needs Assistance Bed Mobility: Supine to Sit     Supine to sit: Min assist     General bed mobility comments: pt able to get leags off bed without assistance, min A to stabilize trunk with elevation  Transfers Overall transfer level: Needs assistance Equipment used: Rolling walker (2 wheeled) Transfers: Sit to/from [facility] Sit to Stand: Min assist Stand pivot transfers: Min assist       General transfer comment: min A for power up and vc's for hand placement and sequencing steps to chair. Pt became dizzy with transfer and was not able to ambulate past chair  Ambulation/Gait             General Gait Details: unable due to dizziness (decr BP)  Stairs            Wheelchair Mobility    Modified Rankin (Stroke  Patients Only)       Balance Overall balance assessment: Needs assistance Sitting-balance support: No upper extremity supported Sitting balance-Leahy Scale: Fair     Standing balance support: Bilateral upper extremity supported Standing balance-Leahy Scale: Poor                               Pertinent Vitals/Pain Pain Assessment: Faces Faces Pain Scale: Hurts even more Pain Location: LLE Pain Descriptors / Indicators: Aching Pain Intervention(s): Limited activity within patient's tolerance;Monitored during session;Premedicated before session    Home Living Family/patient expects to be discharged to:: Skilled nursing facility Living Arrangements: Spouse/significant other Available Help at Discharge: Family;Available 24 hours/day Type of Home: House Home Access: Stairs to enter Entrance Stairs-Rails: Left Entrance Stairs-Number of Steps: 4 Home Layout: One level Home Equipment: Walker - 2 wheels;Walker - 4 wheels;Grab bars - tub/shower;Grab bars - toilet Additional Comments: pt's wife has some health issues, pt would need to be independent at home    Prior Function Level of Independence: Independent         Comments: walks with limp due to polio he reports 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 golf and bowls     Hand Dominance   Dominant Hand: Right    Extremity/Trunk Assessment   Upper Extremity Assessment: Overall WFL for tasks assessed  Lower Extremity Assessment: LLE deficits/detail;RLE deficits/detail RLE Deficits / Details: weaker than LLE at baseline but hip flex 4/5, knee ext 4-/5 LLE Deficits / Details: hip flex 3-/5, knee ext 3-/5  Cervical / Trunk Assessment: Normal  Communication   Communication: No difficulties  Cognition Arousal/Alertness: Awake/alert Behavior During Therapy: WFL for tasks assessed/performed Overall Cognitive Status: Within Functional Limits for tasks assessed                       General Comments General comments (skin integrity, edema, etc.): BP 80/44, HR 82 bpm, O2 sats 98% once in chair, RN present and aware. Also noted bloody drainage through ACE wrap, RN reinforced    Exercises Total Joint Exercises Ankle Circles/Pumps: 20 reps;Seated;Left;AROM Quad Sets: AROM;Both;10 reps;Seated Goniometric ROM: 5-90      Assessment/Plan    PT Assessment Patient needs continued PT services  PT Diagnosis Difficulty walking;Acute pain   PT Problem List Decreased strength;Decreased range of motion;Decreased activity tolerance;Decreased balance;Decreased mobility;Decreased knowledge of use of DME;Decreased knowledge of precautions;Pain  PT Treatment Interventions DME instruction;Gait training;Stair training;Functional mobility training;Therapeutic activities;Therapeutic exercise;Balance training;Patient/family education;Neuromuscular re-education   PT Goals (Current goals can be found in the Care Plan section) Acute Rehab PT Goals Patient Stated Goal: rehab and then home PT Goal Formulation: With patient Time For Goal Achievement: 08/03/15 Potential to Achieve Goals: Good    Frequency 7X/week   Barriers to discharge Decreased caregiver support wife is independent but cannot physically help pt    Co-evaluation               End of Session Equipment Utilized During Treatment: Gait belt Activity Tolerance: Other (comment) (hypotension) Patient left: in chair;with call bell/phone within reach;with nursing/sitter in room Nurse Communication: Mobility status         Time: 1513-1540 PT Time Calculation (min) (ACUTE ONLY): 27 min   Charges:   PT Evaluation $PT Eval Moderate Complexity: 1 Procedure PT Treatments $Therapeutic Activity: 8-22 mins   PT G Codes:       Leighton Roach, PT  Acute Rehab Services  [place], [place] 07/27/2015, 4:14 PM

## 2015-07-27 NOTE — H&P (View-Only) (Signed)
TOTAL KNEE ADMISSION H&P  Patient is being admitted for left total knee arthroplasty.  Subjective:  Chief Complaint:left knee pain.  HPI: Benjamin Cordova, 81 y.o. male, has a history of pain and functional disability in the left knee due to arthritis and has failed non-surgical conservative treatments for greater than 12 weeks to includeNSAID's and/or analgesics, corticosteriod injections, use of assistive devices and activity modification.  Onset of symptoms was gradual, starting 3 years ago with gradually worsening course since that time. The patient noted no past surgery on the left knee(s).  Patient currently rates pain in the left knee(s) at 9 out of 10 with activity. Patient has night pain, worsening of pain with activity and weight bearing, pain that interferes with activities of daily living and joint swelling.  Patient has evidence of subchondral sclerosis and joint space narrowing by imaging studies. There is no active infection.  Patient Active Problem List   Diagnosis Date Noted  . S/P CABG x 2 10/08/2014  . Chest pain 10/01/2014  . Elevated INR 10/01/2014  . Leukocytosis 10/01/2014  . Thrombocytopenia ([place]) 10/01/2014  . Chest pain at rest 10/01/2014  . Unstable angina ([place]) 10/01/2014  . Coagulopathy ([place]) 10/01/2014  . Paroxysmal atrial fibrillation ([place]) 10/01/2014  . Coronary artery disease   . Hypertension   . Arthritis   . Peripheral vascular disease ([place])   . Essential hypertension    Past Medical History  Diagnosis Date  . Coronary artery disease     Multivessel s/p CABG [facility]  . Essential hypertension   . Myocardial infarction ([place]) 1980  . Arthritis   . Hematuria   . Bladder infection, acute   . Peripheral vascular disease ([place])   . 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 TIA (transient ischemic attack)     Chronic coumadin    Past Surgical History  Procedure Laterality Date  . Tonsillectomy  age 66    [facility]  . Foot  surgery  1954    [facility], muscle implantation  . Coronary artery bypass graft  1981    [facility]  . Cystoscopy with injection  10/10/2010    Procedure: CYSTOSCOPY WITH INJECTION;  Surgeon: Marissa Nestle;  Location: [facility];  Service: Urology;  Laterality: N/A;  with retrograde urethrogram  . Cardiac catheterization N/A 10/05/2014    Procedure: Left Heart Cath and Coronary Angiography;  Surgeon: Peter M Martinique, MD;  Location: [place] [facility];  Service: Cardiovascular;  Laterality: N/A;  . Coronary artery bypass graft N/A 10/08/2014    Procedure: REDO CORONARY ARTERY BYPASS GRAFTING (CABG) Times Two Grafts using Left Internal Mammary and Left Radial Artery;  Surgeon: Melrose Nakayama, MD;  Location: [place];  Service: Open Heart Surgery;  Laterality: N/A;  . Tee without cardioversion N/A 10/08/2014    Procedure: TRANSESOPHAGEAL ECHOCARDIOGRAM (TEE);  Surgeon: Melrose Nakayama, MD;  Location: [place];  Service: Open Heart Surgery;  Laterality: N/A;  . Radial artery harvest Left 10/08/2014    Procedure: Left RADIAL ARTERY HARVEST;  Surgeon: Melrose Nakayama, MD;  Location: [place];  Service: Open Heart Surgery;  Laterality: Left;     (Not in a hospital admission) No Known Allergies  Social History  Substance Use Topics  . Smoking status: Former Smoker -- 1.00 packs/day for 28 years    Types: Cigarettes    Quit date: 12/02/1978  . Smokeless tobacco: Current User    Types:  Snuff  . Alcohol Use: No    Family History  Problem Relation Age of Onset  . Anesthesia problems Neg Hx   . Hypotension Neg Hx   . Malignant hyperthermia Neg Hx   . Pseudochol deficiency Neg Hx      Review of Systems  Constitutional: Negative.   HENT: Negative.   Eyes: Negative.   Respiratory: Negative.   Cardiovascular: Negative.   Gastrointestinal: Negative.   Genitourinary: Negative.   Musculoskeletal: Positive for joint pain.  Skin: Negative.   Neurological: Negative.   Endo/Heme/Allergies:  Negative.   Psychiatric/Behavioral: Negative.     Objective:  Physical Exam  Constitutional: He is oriented to person, place, and time. He appears well-developed and well-nourished.  HENT:  Head: Normocephalic and atraumatic.  Eyes: EOM are normal. Pupils are equal, round, and reactive to light.  Neck: Normal range of motion. Neck supple.  Cardiovascular: Normal rate.   Murmur heard. Respiratory: Effort normal and breath sounds normal.  GI: Soft. Bowel sounds are normal.  Musculoskeletal:  Examination of his left knee reveals a 2+ effusion.  Range of motion 3-100 degrees.  Medial joint line tenderness.  Varus thrust partially correctable.  He is neurovascularly intact distally.    Neurological: He is alert and oriented to person, place, and time.  Skin: Skin is warm and dry.  Psychiatric: He has a normal mood and affect. His behavior is normal. Judgment and thought content normal.    Vital signs in last 24 hours: @VSRANGES @  Labs:   Estimated body mass index is 34.68 kg/(m^2) as calculated from the following:   Height as of 07/01/15: 5\' 8"  (1.727 m).   Weight as of 07/01/15: 103.42 kg (228 lb).   Imaging Review Plain radiographs demonstrate severe degenerative joint disease of the left knee(s). The overall alignment ismild varus. The bone quality appears to be fair for age and reported activity level.  Assessment/Plan:  End stage arthritis, left knee   The patient history, physical examination, clinical judgment of the provider and imaging studies are consistent with end stage degenerative joint disease of the left knee(s) and total knee arthroplasty is deemed medically necessary. The treatment options including medical management, injection therapy arthroscopy and arthroplasty were discussed at length. The risks and benefits of total knee arthroplasty were presented and reviewed. The risks due to aseptic loosening, infection, stiffness, patella tracking problems,  thromboembolic complications and other imponderables were discussed. The patient acknowledged the explanation, agreed to proceed with the plan and consent was signed. Patient is being admitted for inpatient treatment for surgery, pain control, PT, OT, prophylactic antibiotics, VTE prophylaxis, progressive ambulation and ADL's and discharge planning. The patient is planning to be discharged to skilled nursing facility

## 2015-07-27 NOTE — Progress Notes (Signed)
After PT assisted pt to chair, pt reported feeling lightheaded and appeared very pale. BP was 80/44 HR 82. Ria Comment PA was notified, received order for 500 cc fluid bolus and to hold any scheduled BP meds until tomorrow morning. Orders carried out. Shortly after bolus was started, RN was called to pt's room and he requested ibuprofen. When asked why, he stated he was having blurred vision for the last 5-10 minutes and at home when this occurs, he takes ibuprofen or aspirin which quickly resolves it. Pt displayed no other signs of stroke, Ria Comment PA again notified. Pt was given the 325 mg aspirin that was ordered, and the blurred vision did resolve within 20 minutes. By completion of bolus, pt stated he felt much better. Will continue to monitor pt.   Raquel James  07/27/2015

## 2015-07-27 NOTE — Anesthesia Postprocedure Evaluation (Signed)
Anesthesia Post Note  Patient: Benjamin Cordova  Procedure(s) Performed: Procedure(s) (LRB): TOTAL LEFT KNEE ARTHROPLASTY (Left)  Patient location during evaluation: PACU Anesthesia Type: General Level of consciousness: awake and alert Pain management: pain level controlled Vital Signs Assessment: post-procedure vital signs reviewed and stable Respiratory status: spontaneous breathing, nonlabored ventilation, respiratory function stable and patient connected to nasal cannula oxygen Cardiovascular status: blood pressure returned to baseline and stable Postop Assessment: no signs of nausea or vomiting Anesthetic complications: no    Last Vitals:  Filed Vitals:   07/27/15 1349 07/27/15 1412  BP:  129/74  Pulse:  79  Temp: 36.7 C 36.8 C  Resp:  14    Last Pain:  Filed Vitals:   07/27/15 1447  PainSc: 6                  Montez Hageman

## 2015-07-27 NOTE — Transfer of Care (Signed)
Immediate Anesthesia Transfer of Care Note  Patient: Benjamin Cordova  Procedure(s) Performed: Procedure(s): TOTAL LEFT KNEE ARTHROPLASTY (Left)  Patient Location: PACU  Anesthesia Type:General  Level of Consciousness: awake, alert  and oriented  Airway & Oxygen Therapy: Patient Spontanous Breathing and Patient connected to nasal cannula oxygen  Post-op Assessment: Report given to RN and Post -op Vital signs reviewed and stable  Post vital signs: Reviewed and stable  Last Vitals:  Filed Vitals:   07/27/15 0754 07/27/15 1212  BP: 137/62   Pulse: 103   Temp: 36.9 C 36.8 C  Resp: 20     Last Pain: There were no vitals filed for this visit.    Patients Stated Pain Goal: 2 ([id number])  Complications: No apparent anesthesia complications

## 2015-07-27 NOTE — Interval H&P Note (Signed)
History and Physical Interval Note:  07/27/2015 8:26 AM  Benjamin Cordova  has presented today for surgery, with the diagnosis of djd left knee  The various methods of treatment have been discussed with the patient and family. After consideration of risks, benefits and other options for treatment, the patient has consented to  Procedure(s): TOTAL LEFT KNEE ARTHROPLASTY (Left) as a surgical intervention .  The patient's history has been reviewed, patient examined, no change in status, stable for surgery.  I have reviewed the patient's chart and labs.  Questions were answered to the patient's satisfaction.     [name]

## 2015-07-27 NOTE — Progress Notes (Signed)
Orthopedic Tech Progress Note Patient Details:  Benjamin Cordova 02-Apr-1935 [id number] Ortho visit put on cpm Patient ID: Benjamin Cordova, male   DOB: [birth year], 81 y.o.   MRN: [id number]   [name] 07/27/2015, 7:16 PM

## 2015-07-27 NOTE — Anesthesia Procedure Notes (Signed)
Procedure Name: Intubation Date/Time: 07/27/2015 10:36 AM Performed by: Eligha Bridegroom Pre-anesthesia Checklist: Patient identified, Emergency Drugs available, Suction available, Patient being monitored and Timeout performed Patient Re-evaluated:Patient Re-evaluated prior to inductionOxygen Delivery Method: Circle system utilized Preoxygenation: Pre-oxygenation with 100% oxygen Intubation Type: IV induction Ventilation: Mask ventilation without difficulty and Oral airway inserted - appropriate to patient size Laryngoscope Size: Mac and 4 Grade View: Grade I Tube type: Oral Tube size: 8.0 mm Number of attempts: 1 Airway Equipment and Method: Stylet Placement Confirmation: ETT inserted through vocal cords under direct vision,  positive ETCO2 and breath sounds checked- equal and bilateral Secured at: 23 cm Tube secured with: Tape Dental Injury: Teeth and Oropharynx as per pre-operative assessment

## 2015-07-27 NOTE — Progress Notes (Signed)
PHARMACIST - PHYSICIAN COMMUNICATION DR:  Percell Miller CONCERNING: Pharmacy Care Issues Regarding Warfarin Labs   * Coumadin 10 mg daily has been ordered to resume POD #1, 07/28/15.  RECOMMENDATION (Action Taken): A daily protime, beginning 07/29/15, has been ordered to meet the [facility] Patient safety goal and comply with the current [place].   The Pharmacy will defer all warfarin dose order changes and follow up of lab results to the prescriber unless an additional order to initiate a "pharmacy Coumadin consult" is placed.  DESCRIPTION:  While hospitalized, to be in compliance with [facility] Patient Safety Goals, all patients on warfarin must have a baseline and/or current protime prior to the administration of warfarin. Pharmacy has received your order for warfarin without these required laboratory assessments.   Kelvin Cellar, RPh Pager: [phone] 07/27/2015 2:12 PM

## 2015-07-28 ENCOUNTER — Encounter (HOSPITAL_COMMUNITY): Payer: Self-pay | Admitting: Orthopedic Surgery

## 2015-07-28 LAB — BASIC METABOLIC PANEL
Anion gap: 9 (ref 5–15)
BUN: 21 mg/dL — AB (ref 6–20)
CO2: 25 mmol/L (ref 22–32)
CREATININE: 0.92 mg/dL (ref 0.61–1.24)
Calcium: 8.1 mg/dL — ABNORMAL LOW (ref 8.9–10.3)
Chloride: 105 mmol/L (ref 101–111)
GFR calc Af Amer: >60 mL/min (ref >60)
GLUCOSE: 143 mg/dL — AB (ref 65–99)
Potassium: 4.5 mmol/L (ref 3.5–5.1)
SODIUM: 139 mmol/L (ref 135–145)

## 2015-07-28 LAB — CBC
HCT: 28.4 % — ABNORMAL LOW (ref 39.0–52.0)
Hemoglobin: 9.1 g/dL — ABNORMAL LOW (ref 13.0–17.0)
MCH: 29.3 pg (ref 26.0–34.0)
MCHC: 32 g/dL (ref 30.0–36.0)
MCV: 91.3 fL (ref 78.0–100.0)
PLATELETS: 106 10*3/uL — AB (ref 150–400)
RBC: 3.11 MIL/uL — ABNORMAL LOW (ref 4.22–5.81)
RDW: 13.8 % (ref 11.5–15.5)
WBC: 6 10*3/uL (ref 4.0–10.5)

## 2015-07-28 MED ORDER — WARFARIN SODIUM 5 MG PO TABS
10.0000 mg | ORAL_TABLET | Freq: Once | ORAL | Status: AC
  Administered 2015-07-28: 10 mg via ORAL
  Filled 2015-07-28: qty 2

## 2015-07-28 MED ORDER — WARFARIN - PHARMACIST DOSING INPATIENT: Freq: Every day | Status: DC

## 2015-07-28 NOTE — Progress Notes (Signed)
Subjective: 1 Day Post-Op Procedure(s) (LRB): TOTAL LEFT Cordova ARTHROPLASTY (Left) Patient reports pain as mild.  No nausea/vomiting, lightheadedness/dizziness, chest pain/sob.  Positive flatus/no bm. Tolerating diet.  Objective: Vital signs in last 24 hours: Temp:  [98 F (36.7 C)-101.2 F (38.4 C)] 98.4 F (36.9 C) (04/27 0449) Pulse Rate:  [75-117] 103 (04/27 0449) Resp:  [10-22] 15 (04/27 0449) BP: (80-140)/(44-90) 115/55 mmHg (04/27 0449) SpO2:  [89 %-100 %] 96 % (04/27 0449) Weight:  [103.42 kg (228 lb)] 103.42 kg (228 lb) (04/26 0754)  Intake/Output from previous day: 04/26 0701 - 04/27 0700 In: 2840 [P.O.:240; I.V.:2000; IV Piggyback:600] Out: 77 [Urine:800; Blood:10] Intake/Output this shift:     Recent Labs  07/27/15 1541  HGB 11.8*    Recent Labs  07/27/15 1541  WBC 14.7*  RBC 3.91*  HCT 36.5*  PLT 181    Recent Labs  07/27/15 1541  CREATININE 0.91    Recent Labs  07/27/15 0743  INR 1.10    Neurologically intact Neurovascular intact Sensation intact distally Intact pulses distally Dorsiflexion/Plantar flexion intact Incision: dressing C/D/I Compartment soft  Assessment/Plan: 1 Day Post-Op Procedure(s) (LRB): TOTAL LEFT Cordova ARTHROPLASTY (Left) Advance diet Up with therapy Discharge to SNF once approved by insurance WBAT RLE ABLA-mild and stable Hypotension/tachycardia- will hold lisinopril (unless bp greater than 130/90),  but continue atenolol due to tachycardia Dry dressing change prn    Benjamin Cordova 07/28/2015, 7:02 AM

## 2015-07-28 NOTE — Evaluation (Signed)
Occupational Therapy Evaluation Patient Details Name: Benjamin Cordova MRN: [id number] DOB: 1935-02-27 Today's Date: 07/28/2015    History of Present Illness Pt is 81 yo male with left TKA, PMH: MI, CVA, polio age 76, affected RLE, OA   Clinical Impression   Patient presenting with decreased ADL and functional mobility independence secondary to above. Patient independent to mod I and assisting wife PTA. Patient currently functioning at an overall min to max assist level, +2 needed for functional transfers. Patient will benefit from acute OT to increase overall independence in the areas of ADLs, functional mobility, and overall safety in order to safely discharge to venue listed below.     Follow Up Recommendations  SNF;Supervision/Assistance - 24 hour    Equipment Recommendations  Other (comment) (TBD)    Recommendations for Other Services  None at this time   Precautions / Restrictions Precautions Precautions: Knee Precaution Comments: reviewed proper positioning and precautions  Required Braces or Orthoses: Knee Immobilizer - Left Knee Immobilizer - Left: Other (comment) (not specified) Restrictions Weight Bearing Restrictions: Yes LLE Weight Bearing: Weight bearing as tolerated    Mobility Bed Mobility Overal bed mobility: Needs Assistance Bed Mobility: Supine to Sit     Supine to sit: Mod assist     General bed mobility comments: HOB flat, mod use of bed rails. Assistance for LLE and trunk control into sitting. Therapist used bed pad to assist with moving hips forward towards EOB. NO complaints of dizziness.   Transfers Overall transfer level: Needs assistance Equipment used: Rolling walker (2 wheeled) Transfers: Sit to/from [facility] Sit to Stand: Max assist;+2 physical assistance;+2 safety/equipment Stand pivot transfers: Max assist;+2 physical assistance;+2 safety/equipment       General transfer comment: Pt with difficulty standing fully  upright. Pt with flexed posture and left knee flexed position even in KI. Pt with NO complaints of dizziness entire session.     Balance Overall balance assessment: Needs assistance Sitting-balance support: No upper extremity supported;Feet supported Sitting balance-Leahy Scale: Fair     Standing balance support: Bilateral upper extremity supported;During functional activity Standing balance-Leahy Scale: Zero Standing balance comment: Poor to zero, pt unable to maintain standing well even with BUE supported on RW    ADL Overall ADL's : Needs assistance/impaired Eating/Feeding: Set up;Sitting   Grooming: Set up;Sitting   Upper Body Bathing: Minimal assitance;Sitting   Lower Body Bathing: Moderate assistance;Sit to/from stand Lower Body Bathing Details (indicate cue type and reason): max assist +2 for sit to/from stand Upper Body Dressing : Minimal assistance;Sitting   Lower Body Dressing: Maximal assistance;Sit to/from stand Lower Body Dressing Details (indicate cue type and reason): max assist +2 for sit to/from stand      Toileting- Clothing Manipulation and Hygiene: Maximal assistance;Sit to/from Nurse, children's Details (indicate cue type and reason): did not occur, safety concerns at this time  Functional mobility during ADLs: Maximal assistance;+2 for physical assistance;+2 for safety/equipment;Rolling walker General ADL Comments: Pt with decreased safety awareness     Vision Vision Assessment?: No apparent visual deficits          Pertinent Vitals/Pain Pain Assessment: 0-10 Pain Score: 4  Pain Location: LLE Pain Descriptors / Indicators: Aching;Guarding;Grimacing Pain Intervention(s): Limited activity within patient's tolerance;Monitored during session;Repositioned     Hand Dominance Right   Extremity/Trunk Assessment Upper Extremity Assessment Upper Extremity Assessment: Generalized weakness   Lower Extremity Assessment Lower Extremity  Assessment: Defer to PT evaluation   Cervical / Trunk Assessment  Cervical / Trunk Assessment: Normal (pt with difficulty standing fully upright )   Communication Communication Communication: No difficulties   Cognition Arousal/Alertness: Awake/alert Behavior During Therapy: WFL for tasks assessed/performed Overall Cognitive Status: Impaired/Different from baseline              Home Living Family/patient expects to be discharged to:: Skilled nursing facility Living Arrangements: Spouse/significant other Available Help at Discharge: Family;Available 24 hours/day Type of Home: House Home Access: Stairs to enter [employer] of Steps: 4 Entrance Stairs-Rails: Left Home Layout: One level     Bathroom Shower/Tub: Tub/shower unit;Curtain   Bathroom Toilet: Handicapped height     Home Equipment: Environmental consultant - 2 wheels;Walker - 4 wheels;Grab bars - tub/shower;Grab bars - toilet   Additional Comments: pt's wife has some health issues, pt would need to be independent at home      Prior Functioning/Environment Level of Independence: Independent        Comments: walks with limp due to polio he reports 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 golf and bowls    OT Diagnosis: Generalized weakness;Acute pain   OT Problem List: Decreased strength;Decreased range of motion;Decreased activity tolerance;Impaired balance (sitting and/or standing);Decreased safety awareness;Decreased knowledge of use of DME or AE;Decreased knowledge of precautions;Pain   OT Treatment/Interventions: Self-care/ADL training;Therapeutic exercise;Energy conservation;DME and/or AE instruction;Patient/family education;Therapeutic activities;Balance training    OT Goals(Current goals can be found in the care plan section) Acute Rehab OT Goals Patient Stated Goal: rehab and then home OT Goal Formulation: With patient Time For Goal Achievement: 08/11/15 Potential to Achieve Goals:  Good ADL Goals Pt Will Perform Grooming: with min assist;standing Pt Will Perform Lower Body Bathing: with min assist;sit to/from stand Pt Will Perform Lower Body Dressing: with min assist;sit to/from stand Pt Will Transfer to Toilet: with min assist;stand pivot transfer;bedside commode Additional ADL Goal #1: Pt will be min assist with sit to/from stands as a precursor for ADLs and to decrease burden of care  OT Frequency: Min 2X/week   Barriers to D/C: Decreased caregiver support   End of Session Equipment Utilized During Treatment: Gait belt;Rolling walker;Left knee immobilizer CPM Left Knee CPM Left Knee: Off Nurse Communication: Mobility status;Other (comment) (NT stated pt did not need chair alarm)  Activity Tolerance: Patient tolerated treatment well Patient left: in chair;with call bell/phone within reach   Time: 0837-0901 OT Time Calculation (min): 24 min Charges:  OT General Charges $OT Visit: 1 Procedure OT Evaluation $OT Eval Moderate Complexity: 1 Procedure OT Treatments $Self Care/Home Management : 8-22 mins  Chrys Racer , MS, OTR/L, CLT Pager: [phone]  07/28/2015, 9:19 AM

## 2015-07-28 NOTE — Progress Notes (Signed)
Physical Therapy Treatment Patient Details Name: Benjamin Cordova MRN: [id number] DOB: [birth year] Today's Date: 07/28/2015    History of Present Illness Pt is 81 yo male with left TKA, PMH: MI, CVA, polio age 8, affected RLE, OA    PT Comments    Pt requiring much more A today.  He was unable to stand upright to the RW due to pain in L LE and weakness in R from hx of Polio. Squat pivot transfer with bed pad under buttocks to A and required +2 physical A.  Pt did move well with bed mobility. Pt with R foot drop and used to wear AFO, but reports he doesn't anymore.  Con't to recommend SNF.  Follow Up Recommendations  SNF     Equipment Recommendations  None recommended by PT    Recommendations for Other Services       Precautions / Restrictions Precautions Precautions: Knee Precaution Booklet Issued: No Precaution Comments: reviewed proper positioning and precautions  Required Braces or Orthoses: Knee Immobilizer - Left Knee Immobilizer - Left: Other (comment) (not specified) Restrictions Weight Bearing Restrictions: Yes LLE Weight Bearing: Weight bearing as tolerated    Mobility  Bed Mobility Overal bed mobility: Needs Assistance Bed Mobility: Sit to Supine     Supine to sit: Mod assist Sit to supine: Min assist   General bed mobility comments: Pt moved well once sitting EOB to return supine  Transfers Overall transfer level: Needs assistance Equipment used: Rolling walker (2 wheeled);2 person hand held assist Transfers: Sit to/from [facility] Sit to Stand: Max assist;+2 physical assistance;+2 safety/equipment Stand pivot transfers: Max assist;+2 physical assistance;+2 safety/equipment       General transfer comment: Attempted to stand at [facility] and pt unable to achieve upright posture and unable to get L UE up onto RW.  Re-positioned chair and did squat pivot transfer with MAX of 2 and use of bed pad under buttocks to A.  A to move L UE from recliner  onto bed for transfer.  Took 2 attempts.  Ambulation/Gait             General Gait Details: deferred   Stairs            Wheelchair Mobility    Modified Rankin (Stroke Patients Only)       Balance Overall balance assessment: Needs assistance Sitting-balance support: Feet supported Sitting balance-Leahy Scale: Fair Sitting balance - Comments: Pt able to sit in recliner and use urinal   Standing balance support: Bilateral upper extremity supported;Single extremity supported Standing balance-Leahy Scale: Zero Standing balance comment: Unable to achieve upright standing                    Cognition Arousal/Alertness: Awake/alert Behavior During Therapy: WFL for tasks assessed/performed Overall Cognitive Status: Within Functional Limits for tasks assessed Area of Impairment: Problem solving         Safety/Judgement: Decreased awareness of safety;Decreased awareness of deficits Awareness: Emergent Problem Solving: Slow processing;Requires verbal cues;Requires tactile cues      Exercises Total Joint Exercises Ankle Circles/Pumps: AROM;Left;15 reps;Supine Quad Sets: Strengthening;Both;Supine;15 reps Heel Slides: AAROM;Both;10 reps;Supine Hip ABduction/ADduction: AROM;AAROM;Both;10 reps;Supine    General Comments General comments (skin integrity, edema, etc.): o2 95% on RA HR 105-115 BP120/62      Pertinent Vitals/Pain Pain Assessment: 0-10 Pain Score: 6  Pain Location: L LE Pain Descriptors / Indicators: Operative site guarding;Grimacing Pain Intervention(s): Patient requesting pain meds-RN notified;Limited activity within patient's tolerance;Monitored during session;Ice applied  Home Living Family/patient expects to be discharged to:: Skilled nursing facility Living Arrangements: Spouse/significant other Available Help at Discharge: Family;Available 24 hours/day Type of Home: House Home Access: Stairs to enter Entrance Stairs-Rails:  Left Home Layout: One level Home Equipment: Walker - 2 wheels;Walker - 4 wheels;Grab bars - tub/shower;Grab bars - toilet Additional Comments: pt's wife has some health issues, pt would need to be independent at home    Prior Function Level of Independence: Independent      Comments: walks with limp due to polio he reports 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 golf and bowls   PT Goals (current goals can now be found in the care plan section) Acute Rehab PT Goals Patient Stated Goal: rehab and then home PT Goal Formulation: With patient Time For Goal Achievement: 08/03/15 Potential to Achieve Goals: Good Progress towards PT goals: Not progressing toward goals - comment (Pt requiring much more A today)    Frequency  7X/week    PT Plan Current plan remains appropriate    Co-evaluation             End of Session Equipment Utilized During Treatment: Gait belt Activity Tolerance: Patient limited by pain Patient left: in bed;with call bell/phone within reach;with bed alarm set     Time: [id number] PT Time Calculation (min) (ACUTE ONLY): 34 min  Charges:  $Therapeutic Exercise: 8-22 mins $Therapeutic Activity: 8-22 mins                    G Codes:      Elyssia Strausser LUBECK 07/28/2015, 10:41 AM

## 2015-07-28 NOTE — NC FL2 (Signed)
[place] LEVEL OF CARE SCREENING TOOL     IDENTIFICATION  Patient Name: Benjamin Cordova Birthdate: Nov 17, 1934 Sex: male Admission Date (Current Location): 07/27/2015  [facility] and [place] Number:  Herbalist and Address:  [facility], [place]  [address] [place], [place] [address]      Provider Number:    Attending Physician Name and Address:  [name], MD  Relative Name and Phone Number:   (spouse Mori Husein [phone])    Current Level of Care: Hospital Recommended Level of Care: [place] Prior Approval Number:    Date Approved/Denied:   PASRR Number:    Discharge Plan: SNF    Current Diagnoses: Patient Active Problem List   Diagnosis Date Noted  . S/P total knee replacement 07/27/2015  . S/P CABG x 2 10/08/2014  . Chest pain 10/01/2014  . Elevated INR 10/01/2014  . Leukocytosis 10/01/2014  . Thrombocytopenia ([place]) 10/01/2014  . Chest pain at rest 10/01/2014  . Unstable angina ([place]) 10/01/2014  . Coagulopathy ([place]) 10/01/2014  . Paroxysmal atrial fibrillation ([place]) 10/01/2014  . Coronary artery disease   . Hypertension   . Arthritis   . Peripheral vascular disease ([place])   . Essential hypertension     Orientation RESPIRATION BLADDER Height & Weight     Self, Time, Situation, Place  O2 (2 liters) Continent Weight: 228 lb (103.42 kg) Height:     BEHAVIORAL SYMPTOMS/MOOD NEUROLOGICAL BOWEL NUTRITION STATUS      Continent Diet (regular)  AMBULATORY STATUS COMMUNICATION OF NEEDS Skin   Limited Assist Verbally Normal                       Personal Care Assistance Level of Assistance  Bathing, Feeding, Dressing Bathing Assistance: Limited assistance Feeding assistance: Independent Dressing Assistance: Limited assistance     Functional Limitations Info  Sight, Hearing, Speech Sight Info: Adequate Hearing Info: Adequate Speech Info: Adequate    SPECIAL CARE FACTORS  FREQUENCY  PT (By licensed PT), OT (By licensed OT)     PT Frequency: 7X/week OT Frequency: Min 2X/week            Contractures      Additional Factors Info  Code Status, Allergies Code Status Info: partial Allergies Info: NKA           Current Medications (07/28/2015):  This is the current hospital active medication list Current Facility-Administered Medications  Medication Dose Route Frequency Provider Last Rate Last Dose  . 0.9 % NaCl with KCl 20 mEq/ L  infusion   Intravenous Continuous Aundra Dubin, PA-C 50 mL/hr at 07/28/15 [id number]    . acetaminophen (TYLENOL) tablet 650 mg  650 mg Oral Q6H PRN Aundra Dubin, PA-C   650 mg at 07/27/15 2241   Or  . acetaminophen (TYLENOL) suppository 650 mg  650 mg Rectal Q6H PRN Aundra Dubin, PA-C      . alum & mag hydroxide-simeth (MAALOX/MYLANTA) 200-200-20 MG/5ML suspension 30 mL  30 mL Oral Q4H PRN Aundra Dubin, PA-C      . atenolol (TENORMIN) tablet 50 mg  50 mg Oral Daily Aundra Dubin, PA-C   50 mg at 07/28/15 1021  . atorvastatin (LIPITOR) tablet 40 mg  40 mg Oral q1800 Aundra Dubin, PA-C   40 mg at 07/27/15 1540  . bisacodyl (DULCOLAX) suppository 10 mg  10 mg Rectal Daily PRN Aundra Dubin, PA-C      .  diphenhydrAMINE (BENADRYL) 12.5 MG/5ML elixir 12.5-25 mg  12.5-25 mg Oral Q4H PRN Aundra Dubin, PA-C      . docusate sodium (COLACE) capsule 100 mg  100 mg Oral BID Aundra Dubin, PA-C   100 mg at 07/28/15 1000  . enoxaparin (LOVENOX) injection 30 mg  30 mg Subcutaneous Q12H Aundra Dubin, PA-C   30 mg at 07/28/15 0751  . HYDROmorphone (DILAUDID) injection 0.5-1 mg  0.5-1 mg Intravenous Q2H PRN Aundra Dubin, PA-C   1 mg at 07/27/15 2241  . lisinopril (PRINIVIL,ZESTRIL) tablet 20 mg  20 mg Oral Daily Aundra Dubin, PA-C   20 mg at 07/28/15 1000  . magnesium citrate solution 1 Bottle  1 Bottle Oral Once PRN Aundra Dubin, PA-C      . magnesium oxide (MAG-OX) tablet 400 mg  400 mg Oral QHS Aundra Dubin, PA-C   400 mg at 07/27/15 2117  . menthol-cetylpyridinium (CEPACOL) lozenge 3 mg  1 lozenge Oral PRN Aundra Dubin, PA-C       Or  . phenol (CHLORASEPTIC) mouth spray 1 spray  1 spray Mouth/Throat PRN Aundra Dubin, PA-C      . methocarbamol (ROBAXIN) tablet 500 mg  500 mg Oral Q6H PRN Aundra Dubin, PA-C   500 mg at 07/28/15 1023   Or  . methocarbamol (ROBAXIN) 500 mg in dextrose 5 % 50 mL IVPB  500 mg Intravenous Q6H PRN Aundra Dubin, PA-C      . metoCLOPramide (REGLAN) tablet 5-10 mg  5-10 mg Oral Q8H PRN Aundra Dubin, PA-C       Or  . metoCLOPramide (REGLAN) injection 5-10 mg  5-10 mg Intravenous Q8H PRN Aundra Dubin, PA-C      . ondansetron [facility]) tablet 4 mg  4 mg Oral Q6H PRN Aundra Dubin, PA-C       Or  . ondansetron [facility]) injection 4 mg  4 mg Intravenous Q6H PRN Aundra Dubin, PA-C      . oxyCODONE (Oxy IR/ROXICODONE) immediate release tablet 5-10 mg  5-10 mg Oral Q3H PRN Aundra Dubin, PA-C   10 mg at 07/28/15 1104  . polyethylene glycol (MIRALAX / GLYCOLAX) packet 17 g  17 g Oral Daily PRN Aundra Dubin, PA-C      . warfarin (COUMADIN) tablet 10 mg  10 mg Oral q1800 Aundra Dubin, PA-C      . Warfarin - Pharmacist Dosing Inpatient   Does not apply q1800 [name], MD         Discharge Medications: Please see discharge summary for a list of discharge medications.  Relevant Imaging Results:  Relevant Lab Results:   Additional Information SS# [id number]  Minta Balsam  BSW intern  [phone]

## 2015-07-28 NOTE — Progress Notes (Signed)
Orthopedic Tech Progress Note Patient Details:  Benjamin Cordova 1935/01/20 [id number]  CPM Left Knee CPM Left Knee: On Left Knee Flexion (Degrees): 60 Left Knee Extension (Degrees): 0 Additional Comments: trapeze bar patient helper   [name] 07/28/2015, 2:14 PM

## 2015-07-28 NOTE — Progress Notes (Signed)
ANTICOAGULATION CONSULT NOTE - Initial Consult  Pharmacy Consult:  Coumadin Indication: atrial fibrillation and VTE prophylaxis s/p left TKA  No Known Allergies  Patient Measurements: Weight: 228 lb (103.42 kg)  Vital Signs: Temp: 98.4 F (36.9 C) (04/27 0449) Temp Source: Oral (04/27 0449) BP: 113/55 mmHg (04/27 1254) Pulse Rate: 109 (04/27 1254)  Labs:  Recent Labs  07/27/15 0743 07/27/15 1541 07/28/15 0538  HGB  --  11.8* 9.1*  HCT  --  36.5* 28.4*  PLT  --  181 106*  LABPROT 14.4  --   --   INR 1.10  --   --   CREATININE  --  0.91 0.92    Estimated Creatinine Clearance: 81 mL/min (by C-G formula based on Cr of 0.92).   Medical History: Past Medical History  Diagnosis Date  . Coronary artery disease     Multivessel s/p CABG [facility]  . Essential hypertension   . Myocardial infarction ([place]) 1980  . Arthritis   . Hematuria   . Bladder infection, acute   . Peripheral vascular disease ([place])   . 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 TIA (transient ischemic attack)     Chronic coumadin  . Stroke ([place])   . Shortness of breath dyspnea   . Pneumonia   . Headache       Assessment: 80 YOM to resume Coumadin for history of Afib and VTE prophylaxis s/p left TKA.  He is also started on Lovenox post-op.  Patient's INR is currently at baseline since his last Coumadin dose was on 07/23/15.  No bleeding reported.   Goal of Therapy:  INR 2-3    Plan:  - Coumadin 10mg  PO today - Lovenox 30mg  SQ Q12H until INR therapeutic - Daily PT / INR    Valery Amedee D. Mina Marble, PharmD, BCPS Pager:  [phone] 07/28/2015, 1:14 PM

## 2015-07-28 NOTE — Discharge Instructions (Signed)
INSTRUCTIONS AFTER JOINT REPLACEMENT  ° °o Remove items at home which could result in a fall. This includes throw rugs or furniture in walking pathways °o ICE to the affected joint every three hours while awake for 30 minutes at a time, for at least the first 3-5 days, and then as needed for pain and swelling.  Continue to use ice for pain and swelling. You may notice swelling that will progress down to the foot and ankle.  This is normal after surgery.  Elevate your leg when you are not up walking on it.   °o Continue to use the breathing machine you got in the hospital (incentive spirometer) which will help keep your temperature down.  It is common for your temperature to cycle up and down following surgery, especially at night when you are not up moving around and exerting yourself.  The breathing machine keeps your lungs expanded and your temperature down. ° ° °DIET:  As you were doing prior to hospitalization, we recommend a well-balanced diet. ° °DRESSING / WOUND CARE / SHOWERING ° °Keep the surgical dressing until follow up.  The dressing is water proof, so you can shower without any extra covering.  IF THE DRESSING FALLS OFF or the wound gets wet inside, change the dressing with sterile gauze.  Please use good hand washing techniques before changing the dressing.  Do not use any lotions or creams on the incision until instructed by your surgeon.   ° °ACTIVITY ° °o Increase activity slowly as tolerated, but follow the weight bearing instructions below.   °o No driving for 6 weeks or until further direction given by your physician.  You cannot drive while taking narcotics.  °o No lifting or carrying greater than 10 lbs. until further directed by your surgeon. °o Avoid periods of inactivity such as sitting longer than an hour when not asleep. This helps prevent blood clots.  °o You may return to work once you are authorized by your doctor.  ° ° ° °WEIGHT BEARING  ° °Weight bearing as tolerated with assist  device (walker, cane, etc) as directed, use it as long as suggested by your surgeon or therapist, typically at least 4-6 weeks. ° ° °EXERCISES ° °Results after joint replacement surgery are often greatly improved when you follow the exercise, range of motion and muscle strengthening exercises prescribed by your doctor. Safety measures are also important to protect the joint from further injury. Any time any of these exercises cause you to have increased pain or swelling, decrease what you are doing until you are comfortable again and then slowly increase them. If you have problems or questions, call your caregiver or physical therapist for advice.  ° °Rehabilitation is important following a joint replacement. After just a few days of immobilization, the muscles of the leg can become weakened and shrink (atrophy).  These exercises are designed to build up the tone and strength of the thigh and leg muscles and to improve motion. Often times heat used for twenty to thirty minutes before working out will loosen up your tissues and help with improving the range of motion but do not use heat for the first two weeks following surgery (sometimes heat can increase post-operative swelling).  ° °These exercises can be done on a training (exercise) mat, on the floor, on a table or on a bed. Use whatever works the best and is most comfortable for you.    Use music or television while you are exercising so that   the exercises are a pleasant break in your day. This will make your life better with the exercises acting as a break in your routine that you can look forward to.   Perform all exercises about fifteen times, three times per day or as directed.  You should exercise both the operative leg and the other leg as well. ° °Exercises include: °  °• Quad Sets - Tighten up the muscle on the front of the thigh (Quad) and hold for 5-10 seconds.   °• Straight Leg Raises - With your knee straight (if you were given a brace, keep it on),  lift the leg to 60 degrees, hold for 3 seconds, and slowly lower the leg.  Perform this exercise against resistance later as your leg gets stronger.  °• Leg Slides: Lying on your back, slowly slide your foot toward your buttocks, bending your knee up off the floor (only go as far as is comfortable). Then slowly slide your foot back down until your leg is flat on the floor again.  °• Angel Wings: Lying on your back spread your legs to the side as far apart as you can without causing discomfort.  °• Hamstring Strength:  Lying on your back, push your heel against the floor with your leg straight by tightening up the muscles of your buttocks.  Repeat, but this time bend your knee to a comfortable angle, and push your heel against the floor.  You may put a pillow under the heel to make it more comfortable if necessary.  ° °A rehabilitation program following joint replacement surgery can speed recovery and prevent re-injury in the future due to weakened muscles. Contact your doctor or a physical therapist for more information on knee rehabilitation.  ° ° °CONSTIPATION ° °Constipation is defined medically as fewer than three stools per week and severe constipation as less than one stool per week.  Even if you have a regular bowel pattern at home, your normal regimen is likely to be disrupted due to multiple reasons following surgery.  Combination of anesthesia, postoperative narcotics, change in appetite and fluid intake all can affect your bowels.  ° °YOU MUST use at least one of the following options; they are listed in order of increasing strength to get the job done.  They are all available over the counter, and you may need to use some, POSSIBLY even all of these options:   ° °Drink plenty of fluids (prune juice may be helpful) and high fiber foods °Colace 100 mg by mouth twice a day  °Senokot for constipation as directed and as needed Dulcolax (bisacodyl), take with full glass of water  °Miralax (polyethylene glycol)  once or twice a day as needed. ° °If you have tried all these things and are unable to have a bowel movement in the first 3-4 days after surgery call either your surgeon or your primary doctor.   ° °If you experience loose stools or diarrhea, hold the medications until you stool forms back up.  If your symptoms do not get better within 1 week or if they get worse, check with your doctor.  If you experience "the worst abdominal pain ever" or develop nausea or vomiting, please contact the office immediately for further recommendations for treatment. ° ° °ITCHING:  If you experience itching with your medications, try taking only a single pain pill, or even half a pain pill at a time.  You can also use Benadryl over the counter for itching or also to   help with sleep.   TED HOSE STOCKINGS:  Use stockings on both legs until for at least 2 weeks or as directed by physician office. They may be removed at night for sleeping.  MEDICATIONS:  See your medication summary on the After Visit Summary that nursing will review with you.  You may have some home medications which will be placed on hold until you complete the course of blood thinner medication.  It is important for you to complete the blood thinner medication as prescribed.  PRECAUTIONS:  If you experience chest pain or shortness of breath - call 911 immediately for transfer to the hospital emergency department.   If you develop a fever greater that 101 F, purulent drainage from wound, increased redness or drainage from wound, foul odor from the wound/dressing, or calf pain - CONTACT YOUR SURGEON.                                                   FOLLOW-UP APPOINTMENTS:  If you do not already have a post-op appointment, please call the office for an appointment to be seen by your surgeon.  Guidelines for how soon to be seen are listed in your After Visit Summary, but are typically between 1-4 weeks after surgery.  OTHER INSTRUCTIONS:   Knee  Replacement:  Do not place pillow under knee, focus on keeping the knee straight while resting. CPM instructions: 0-90 degrees, 2 hours in the morning, 2 hours in the afternoon, and 2 hours in the evening. Place foam block, curve side up under heel at all times except when in CPM or when walking.  DO NOT modify, tear, cut, or change the foam block in any way.  MAKE SURE YOU:   Understand these instructions.   Get help right away if you are not doing well or get worse.    Thank you for letting us be a part of your medical care team.  It is a privilege we respect greatly.  We hope these instructions will help you stay on track for a fast and full recovery!       Information on my medicine - Coumadin   (Warfarin)  This medication education was reviewed with me or my healthcare representative as part of my discharge preparation.  The pharmacist that spoke with me during my hospital stay was:  Saundra Shelling, [facility]  Why was Coumadin prescribed for you? Coumadin was prescribed for you because you have a blood clot or a medical condition that can cause an increased risk of forming blood clots. Blood clots can cause serious health problems by blocking the flow of blood to the heart, lung, or brain. Coumadin can prevent harmful blood clots from forming. As a reminder your indication for Coumadin is:   Blood Clot Prevention After Orthopedic Surgery and Atrial fibrillation  What test will check on my response to Coumadin? While on Coumadin (warfarin) you will need to have an INR test regularly to ensure that your dose is keeping you in the desired range. The INR (international normalized ratio) number is calculated from the result of the laboratory test called prothrombin time (PT).  If an INR APPOINTMENT HAS NOT ALREADY BEEN MADE FOR YOU please schedule an appointment to have this lab work done by your health care provider within 7 days. Your INR goal is usually a  number between:  2 to 3 or your  provider may give you a more narrow range like 2-2.5.  Ask your health care provider during an office visit what your goal INR is.  What  do you need to  know  About  COUMADIN? Take Coumadin (warfarin) exactly as prescribed by your healthcare provider about the same time each day.  DO NOT stop taking without talking to the doctor who prescribed the medication.  Stopping without other blood clot prevention medication to take the place of Coumadin may increase your risk of developing a new clot or stroke.  Get refills before you run out.  What do you do if you miss a dose? If you miss a dose, take it as soon as you remember on the same day then continue your regularly scheduled regimen the next day.  Do not take two doses of Coumadin at the same time.  Important Safety Information A possible side effect of Coumadin (Warfarin) is an increased risk of bleeding. You should call your healthcare provider right away if you experience any of the following: ? Bleeding from an injury or your nose that does not stop. ? Unusual colored urine (red or dark brown) or unusual colored stools (red or black). ? Unusual bruising for unknown reasons. ? A serious fall or if you hit your head (even if there is no bleeding).  Some foods or medicines interact with Coumadin (warfarin) and might alter your response to warfarin. To help avoid this: ? Eat a balanced diet, maintaining a consistent amount of Vitamin K. ? Notify your provider about major diet changes you plan to make. ? Avoid alcohol or limit your intake to 1 drink for women and 2 drinks for men per day. (1 drink is 5 oz. wine, 12 oz. beer, or 1.5 oz. liquor.)  Make sure that ANY health care provider who prescribes medication for you knows that you are taking Coumadin (warfarin).  Also make sure the healthcare provider who is monitoring your Coumadin knows when you have started a new medication including herbals and non-prescription products.  Coumadin  (Warfarin)  Major Drug Interactions  Increased Warfarin Effect Decreased Warfarin Effect  Alcohol (large quantities) Antibiotics (esp. Septra/Bactrim, Flagyl, Cipro) Amiodarone (Cordarone) Aspirin (ASA) Cimetidine (Tagamet) Megestrol (Megace) NSAIDs (ibuprofen, naproxen, etc.) Piroxicam (Feldene) Propafenone (Rythmol SR) Propranolol (Inderal) Isoniazid (INH) Posaconazole (Noxafil) Barbiturates (Phenobarbital) Carbamazepine (Tegretol) Chlordiazepoxide (Librium) Cholestyramine (Questran) Griseofulvin Oral Contraceptives Rifampin Sucralfate (Carafate) Vitamin K   Coumadin (Warfarin) Major Herbal Interactions  Increased Warfarin Effect Decreased Warfarin Effect  Garlic Ginseng Ginkgo biloba Coenzyme Q10 Green tea St. Johns wort    Coumadin (Warfarin) FOOD Interactions  Eat a consistent number of servings per week of foods HIGH in Vitamin K (1 serving =  cup)  Collards (cooked, or boiled & drained) Kale (cooked, or boiled & drained) Mustard greens (cooked, or boiled & drained) Parsley *serving size only =  cup Spinach (cooked, or boiled & drained) Swiss chard (cooked, or boiled & drained) Turnip greens (cooked, or boiled & drained)  Eat a consistent number of servings per week of foods MEDIUM-HIGH in Vitamin K (1 serving = 1 cup)  Asparagus (cooked, or boiled & drained) Broccoli (cooked, boiled & drained, or raw & chopped) Brussel sprouts (cooked, or boiled & drained) *serving size only =  cup Lettuce, raw (green leaf, endive, romaine) Spinach, raw Turnip greens, raw & chopped   These websites have more information on Coumadin (warfarin):  FailFactory.se; VeganReport.com.au;

## 2015-07-29 ENCOUNTER — Inpatient Hospital Stay (HOSPITAL_COMMUNITY): Payer: Commercial Managed Care - HMO

## 2015-07-29 DIAGNOSIS — H538 Other visual disturbances: Secondary | ICD-10-CM

## 2015-07-29 DIAGNOSIS — D649 Anemia, unspecified: Secondary | ICD-10-CM | POA: Diagnosis present

## 2015-07-29 DIAGNOSIS — D696 Thrombocytopenia, unspecified: Secondary | ICD-10-CM

## 2015-07-29 LAB — BASIC METABOLIC PANEL
ANION GAP: 8 (ref 5–15)
BUN: 19 mg/dL (ref 6–20)
CALCIUM: 8.3 mg/dL — AB (ref 8.9–10.3)
CHLORIDE: 103 mmol/L (ref 101–111)
CO2: 27 mmol/L (ref 22–32)
CREATININE: 0.99 mg/dL (ref 0.61–1.24)
GFR calc non Af Amer: >60 mL/min (ref >60)
GLUCOSE: 127 mg/dL — AB (ref 65–99)
Potassium: 4 mmol/L (ref 3.5–5.1)
Sodium: 138 mmol/L (ref 135–145)

## 2015-07-29 LAB — CBC
HEMATOCRIT: 24.3 % — AB (ref 39.0–52.0)
Hemoglobin: 7.7 g/dL — ABNORMAL LOW (ref 13.0–17.0)
MCH: 29.5 pg (ref 26.0–34.0)
MCHC: 31.7 g/dL (ref 30.0–36.0)
MCV: 93.1 fL (ref 78.0–100.0)
PLATELETS: 100 10*3/uL — AB (ref 150–400)
RBC: 2.61 MIL/uL — ABNORMAL LOW (ref 4.22–5.81)
RDW: 14 % (ref 11.5–15.5)
WBC: 6.4 10*3/uL (ref 4.0–10.5)

## 2015-07-29 LAB — PROTIME-INR
INR: 1.3 (ref 0.00–1.49)
Prothrombin Time: 16.3 seconds — ABNORMAL HIGH (ref 11.6–15.2)

## 2015-07-29 MED ORDER — ASPIRIN EC 325 MG PO TBEC: 325.0000 mg | DELAYED_RELEASE_TABLET | Freq: Once | ORAL | Status: AC

## 2015-07-29 MED ORDER — WARFARIN SODIUM 5 MG PO TABS
10.0000 mg | ORAL_TABLET | Freq: Once | ORAL | Status: AC
  Administered 2015-07-29: 10 mg via ORAL
  Filled 2015-07-29: qty 2

## 2015-07-29 MED ORDER — ASPIRIN EC 325 MG PO TBEC: 325.0000 mg | DELAYED_RELEASE_TABLET | Freq: Every day | ORAL | Status: DC

## 2015-07-29 MED ORDER — ASPIRIN 325 MG PO TABS
ORAL_TABLET | ORAL | Status: AC
  Administered 2015-07-29: 325 mg
  Filled 2015-07-29: qty 1

## 2015-07-29 NOTE — Progress Notes (Signed)
Physical Therapy Treatment Patient Details Name: Benjamin Cordova MRN: [id number] DOB: 1935/01/21 Today's Date: 07/29/2015    History of Present Illness Pt is 81 yo male with left TKA, PMH: MI, CVA, polio age 54, affected RLE, OA    PT Comments    Pt able to progress to gait training required reverse sequencing due to weakness in R leg from polio.  PTA ace wrapped R foot into dorsiflexion to improve gait quality.    Follow Up Recommendations  SNF     Equipment Recommendations  None recommended by PT    Recommendations for Other Services       Precautions / Restrictions Precautions Precautions: Knee Precaution Booklet Issued: No Required Braces or Orthoses: Knee Immobilizer - Left (ace wrapped R foot into dorsiflexion.  ) Knee Immobilizer - Left: Other (comment) Restrictions Weight Bearing Restrictions: Yes LLE Weight Bearing: Weight bearing as tolerated    Mobility  Bed Mobility Overal bed mobility: Needs Assistance Bed Mobility: Sit to Supine     Supine to sit: Mod assist     General bed mobility comments: Pt required mod assist to mobilize to edge of bed and max assist +2 to scoot to edge of bed once in sitting.    Transfers Overall transfer level: Needs assistance Equipment used: Rolling walker (2 wheeled) Transfers: Sit to/from Stand Sit to Stand: Mod assist;+2 physical assistance Stand pivot transfers: Mod assist;+2 physical assistance       General transfer comment: Pt required cues for hand placement to push from railing on elevated bed.  Pt required facilitation to forward weight shift and transition hand placement to hand grips on RW.  Pt required cues  upper trunk extension and hip extension.    Ambulation/Gait Ambulation/Gait assistance: Mod assist;+2 physical assistance;+2 safety/equipment (at x required max +1 to mod +2.  ) Ambulation Distance (Feet): 10 Feet Assistive device: Rolling walker (2 wheeled) Gait Pattern/deviations: Step-to  pattern;Decreased weight shift to right;Decreased step length - left;Trunk flexed   Gait velocity interpretation: Below normal speed for age/gender General Gait Details: Pt required cues for sequencing with R LE first and surgical LE to follow.  Pt required cues to switch sequencing due to weakness in RLE.  RLE ace wrapped into dorsiflexion to improve right foot clearance.  Pt fatigues quickly and required cues for posture throughout gait training.     Stairs            Wheelchair Mobility    Modified Rankin (Stroke Patients Only)       Balance     Sitting balance-Leahy Scale: Poor       Standing balance-Leahy Scale: Poor                      Cognition Arousal/Alertness: Awake/alert Behavior During Therapy: WFL for tasks assessed/performed Overall Cognitive Status: Within Functional Limits for tasks assessed                      Exercises Total Joint Exercises Ankle Circles/Pumps:  (deferred therapeutic exercise due to fatigue from gait training.  )    General Comments        Pertinent Vitals/Pain Pain Assessment: Faces Faces Pain Scale: Hurts even more Pain Location: LLE Pain Descriptors / Indicators: Grimacing;Guarding Pain Intervention(s): Monitored during session;Repositioned;Ice applied    Home Living                      Prior Function  PT Goals (current goals can now be found in the care plan section) Acute Rehab PT Goals Patient Stated Goal: rehab and then home Potential to Achieve Goals: Good Progress towards PT goals: Progressing toward goals    Frequency  7X/week    PT Plan Current plan remains appropriate    Co-evaluation             End of Session Equipment Utilized During Treatment: Gait belt Activity Tolerance: Patient limited by pain Patient left: in bed;with call bell/phone within reach;with bed alarm set     Time: 1040-1052 PT Time Calculation (min) (ACUTE ONLY): 12 min  Charges:   $Gait Training: 8-22 mins                    G Codes:      Cristela Blue 08-04-15, 11:11 AM  Governor Rooks, PTA pager [phone]

## 2015-07-29 NOTE — Clinical Social Work Note (Signed)
PASARR: [id number] Benjamin Cordova, [place] [facility]: [phone] Surgical: [phone]

## 2015-07-29 NOTE — Clinical Social Work Placement (Addendum)
   CLINICAL SOCIAL WORK PLACEMENT  NOTE  Date:  07/29/2015  Patient Details  Name: Benjamin Cordova MRN: [id number] Date of Birth: [birth year]  Clinical Social Work is seeking post-discharge placement for this patient at the [place] level of care (*CSW will initial, date and re-position this form in  chart as items are completed):  Yes   Patient/family provided with [facility] Work Department's list of facilities offering this level of care within the geographic area requested by the patient (or if unable, by the patient's family).  Yes   Patient/family informed of their freedom to choose among providers that offer the needed level of care, that participate in Medicare, Medicaid or managed care program needed by the patient, have an available bed and are willing to accept the patient.  Yes   Patient/family informed of [place]'s ownership interest in [facility] and [facility], as well as of the fact that they are under no obligation to receive care at these facilities.  PASRR submitted to EDS on 07/29/15     PASRR number received on 07/29/15     Existing PASRR number confirmed on       FL2 transmitted to all facilities in geographic area requested by pt/family on 07/29/15     FL2 transmitted to all facilities within larger geographic area on       Patient informed that his/her managed care company has contracts with or will negotiate with certain facilities, including the following:        Yes   Patient/family informed of bed offers received.  Patient chooses bed at [facility]     Physician recommends and patient chooses bed at      Patient to be transferred to [facility] on .  Patient to be transferred to facility by [facility]     Patient family notified on  of transfer.  Name of family member notified:    PHYSICIAN       Additional Comment:    _______________________________________________ Caroline Sauger,  LCSW 07/29/2015, 12:10 PM

## 2015-07-29 NOTE — Consult Note (Signed)
Medical Consultation   Benjamin Cordova  [id number]  DOB: 10/20/1934  DOA: 07/27/2015  PCP: Wende Neighbors, MD   Outpatient Specialists:    Requesting physician: Tiney Rouge  Reason for consultation: Blurred vision   History of Present Illness: Benjamin Cordova is an 81 y.o. male with a past medical history that includes CAD, A. fib on Coumadin, stroke 27 years ago, arthritis, hypertension underwent left total knee arthroplasty 2 days ago. He sent experienced episode of blurry vision yesterday and again this morning. Scheduled for discharge to facility per orthopedics today. Orthopedics requesting consult clearance for discharge in setting of 2 episodes of blurred vision.  Formation is obtained from the patient. He states that yesterday and today he had an episode of blurred vision. He reports he's had these episodes every 1-2 months for 27 years since his stroke. Typically vision becomes blurred in both eyes, he takes an aspirin and the blurriness resolves. He states episodes usually last  than 5 minutes. He describes both episodes of yesterday and today as typical for his usual episodes. He denies any headache dizziness lightheadedness nausea diaphoresis chest pain or palpitations. He denies any numbness or tingling of his extremities. He denies any difficulty speaking or swallowing. These episodes he received aspirin and the blurred vision resolved within 5 minutes. He denies any abdominal pain dysuria hematuria frequency or urgency.    Review of Systems:  ROS 10 point review of systems complete and all systems are negative except as indicated in the history of present illness As per HPI otherwise 10 point review of systems negative.    Past Medical History: Past Medical History  Diagnosis Date  . Coronary artery disease     Multivessel s/p CABG 1981 at [facility]  . Essential hypertension   . Myocardial infarction ([place]) 1980  . Arthritis   . Hematuria   . Bladder  infection, acute   . Peripheral vascular disease ([place])   . Polio osteopathy of lower leg ([place]) Age 43    Affected right leg  . TIA (transient ischemic attack)     Chronic coumadin  . Stroke ([place])   . Shortness of breath dyspnea   . Pneumonia   . Headache     Past Surgical History: Past Surgical History  Procedure Laterality Date  . Tonsillectomy  age 51    [facility]  . Foot surgery  1954    [facility], muscle implantation  . Coronary artery bypass graft  1981    [facility]  . Cystoscopy with injection  10/10/2010    Procedure: CYSTOSCOPY WITH INJECTION;  Surgeon: Marissa Nestle;  Location: [facility];  Service: Urology;  Laterality: N/A;  with retrograde urethrogram  . Cardiac catheterization N/A 10/05/2014    Procedure: Left Heart Cath and Coronary Angiography;  Surgeon: Peter M Martinique, MD;  Location: [place] CV LAB;  Service: Cardiovascular;  Laterality: N/A;  . Coronary artery bypass graft N/A 10/08/2014    Procedure: REDO CORONARY ARTERY BYPASS GRAFTING (CABG) Times Two Grafts using Left Internal Mammary and Left Radial Artery;  Surgeon: Melrose Nakayama, MD;  Location: [place];  Service: Open Heart Surgery;  Laterality: N/A;  . Tee without cardioversion N/A 10/08/2014    Procedure: TRANSESOPHAGEAL ECHOCARDIOGRAM (TEE);  Surgeon: Melrose Nakayama, MD;  Location: [place];  Service: Open Heart Surgery;  Laterality: N/A;  . Radial artery harvest Left 10/08/2014    Procedure:  Left RADIAL ARTERY HARVEST;  Surgeon: Melrose Nakayama, MD;  Location: [place];  Service: Open Heart Surgery;  Laterality: Left;  . Total knee arthroplasty Left 07/27/2015    Procedure: TOTAL LEFT KNEE ARTHROPLASTY;  Surgeon: [name], MD;  Location: [place];  Service: Orthopedics;  Laterality: Left;     Allergies:  No Known Allergies   Social History:  reports that he quit smoking about 36 years ago. His smoking use included Cigarettes. He has a 28 pack-year smoking history. His smokeless tobacco use  includes Snuff. He reports that he does not drink alcohol or use illicit drugs. Patient lives at home he is retired Administrator independent with ADLs  Family History: Family History  Problem Relation Age of Onset  . Anesthesia problems Neg Hx   . Hypotension Neg Hx   . Malignant hyperthermia Neg Hx   . Pseudochol deficiency Neg Hx      Physical Exam: Filed Vitals:   07/28/15 1015 07/28/15 1254 07/28/15 1952 07/29/15 0438  BP: 120/62 113/55 119/49 114/50  Pulse: 118 109 93 108  Temp:   98.5 F (36.9 C) 100.5 F (38.1 C)  TempSrc:   Oral Oral  Resp:  16 16 18   Weight:      SpO2: 96% 95% 94% 90%    Constitutional: Appearance well-nourished sitting up eating breakfast,  Alert and awake, oriented x3, not in any acute distress. Eyes: PERLA, EOMI, irises appear normal, anicteric sclera,  ENMT: external ears and nose appear normal,             Lips appears normal, oropharynx mucosa, tongue, posterior pharynx appear normal  Neck: neck appears normal, no masses, normal ROM, no thyromegaly, no JVD  CVS: S1-S2 clear, no murmur rubs or gallops, no LE edema, normal pedal pulses  Respiratory:  clear to auscultation bilaterally, no wheezing, rales or rhonchi. Respiratory effort normal. No accessory muscle use.  Abdomen: soft nontender, nondistended, normal bowel sounds, no hepatosplenomegaly, no hernias  Musculoskeletal: : no cyanosis, clubbing or edema noted bilaterally left knee with dressing dry and intact Neuro: Cranial nerves II-XII intact, strength, sensation, reflexes, speech clear facial symmetry tongue midline bilateral grip 5 out of 5 Psych: judgement and insight appear normal, stable mood and affect, mental status Skin: no rashes or lesions or ulcers, no induration or nodules   Data reviewed:  I have personally reviewed following labs and imaging studies Labs:  CBC:  Recent Labs Lab 07/27/15 1541 07/28/15 0538 07/29/15 0527  WBC 14.7* 6.0 6.4  HGB 11.8* 9.1* 7.7*  HCT  36.5* 28.4* 24.3*  MCV 93.4 91.3 93.1  PLT 181 106* 100*    Basic Metabolic Panel:  Recent Labs Lab 07/27/15 1541 07/28/15 0538 07/29/15 0527  NA  --  139 138  K  --  4.5 4.0  CL  --  105 103  CO2  --  25 27  GLUCOSE  --  143* 127*  BUN  --  21* 19  CREATININE 0.91 0.92 0.99  CALCIUM  --  8.1* 8.3*   GFR Estimated Creatinine Clearance: 75.2 mL/min (by C-G formula based on Cr of 0.99). Liver Function Tests: No results for input(s): AST, ALT, ALKPHOS, BILITOT, PROT, ALBUMIN in the last 168 hours. No results for input(s): LIPASE, AMYLASE in the last 168 hours. No results for input(s): AMMONIA in the last 168 hours. Coagulation profile  Recent Labs Lab 07/27/15 0743 07/29/15 0527  INR 1.10 1.30    Cardiac Enzymes: No results  for input(s): CKTOTAL, CKMB, CKMBINDEX, TROPONINI in the last 168 hours. BNP: Invalid input(s): POCBNP CBG: No results for input(s): GLUCAP in the last 168 hours. D-Dimer No results for input(s): DDIMER in the last 72 hours. Hgb A1c No results for input(s): HGBA1C in the last 72 hours. Lipid Profile No results for input(s): CHOL, HDL, LDLCALC, TRIG, CHOLHDL, LDLDIRECT in the last 72 hours. Thyroid function studies No results for input(s): TSH, T4TOTAL, T3FREE, THYROIDAB in the last 72 hours.  Invalid input(s): FREET3 Anemia work up No results for input(s): VITAMINB12, FOLATE, FERRITIN, TIBC, IRON, RETICCTPCT in the last 72 hours. Urinalysis    Component Value Date/Time   COLORURINE ORANGE* 10/07/2014 1638   APPEARANCEUR TURBID* 10/07/2014 1638   LABSPEC 1.011 10/07/2014 1638   PHURINE 6.5 10/07/2014 1638   GLUCOSEU NEGATIVE 10/07/2014 1638   HGBUR LARGE* 10/07/2014 1638   BILIRUBINUR NEGATIVE 10/07/2014 1638   KETONESUR NEGATIVE 10/07/2014 1638   PROTEINUR 30* 10/07/2014 1638   UROBILINOGEN 1.0 10/07/2014 1638   NITRITE POSITIVE* 10/07/2014 1638   LEUKOCYTESUR LARGE* 10/07/2014 1638     Sepsis Labs Invalid input(s):  PROCALCITONIN,  WBC,  LACTICIDVEN Microbiology No results found for this or any previous visit (from the past 240 hour(s)).     Inpatient Medications:   Scheduled Meds: . atenolol  50 mg Oral Daily  . atorvastatin  40 mg Oral q1800  . docusate sodium  100 mg Oral BID  . enoxaparin (LOVENOX) injection  30 mg Subcutaneous Q12H  . lisinopril  20 mg Oral Daily  . magnesium oxide  400 mg Oral QHS  . warfarin  10 mg Oral ONCE-1800  . Warfarin - Pharmacist Dosing Inpatient   Does not apply q1800   Continuous Infusions: . 0.9 % NaCl with KCl 20 mEq / L 50 mL/hr at 07/29/15 [id number]     Radiological Exams on Admission: Ct Head Wo Contrast  07/29/2015  CLINICAL DATA:  Blurred vision EXAM: CT HEAD WITHOUT CONTRAST TECHNIQUE: Contiguous axial images were obtained from the base of the skull through the vertex without intravenous contrast. COMPARISON:  None. FINDINGS: Brain: No intracranial hemorrhage, mass effect or midline shift. There is large area of encephalomalacia in right anterior temporal and right frontal lobe most likely from previous infarct measures at least 5 cm. Mild cerebral atrophy. Mild periventricular white matter decreased attenuation probable due to chronic small vessel ischemic changes. No definite acute cortical infarction. No mass lesion is noted on this unenhanced scan. Vascular: Atherosclerotic calcifications of carotid siphon. Skull: No skull fracture is noted. Sinuses/Orbits: There is probable mucous retention cyst medial aspect of the left maxillary sinus measures mild 9 mm. The mastoid air cells are unremarkable. Other: None IMPRESSION: No acute intracranial abnormality. There is encephalomalacia in right temporal and right frontal lobe probable from prior infarct. Mild cerebral atrophy. Mild periventricular and patchy subcortical white matter decreased attenuation probable due to chronic small vessel ischemic changes. No definite acute cortical infarction. Electronically Signed    By: Lahoma Crocker M.D.   On: 07/29/2015 09:56   Dg Knee Left Port  07/27/2015  CLINICAL DATA:  Status post left knee replacement EXAM: PORTABLE LEFT KNEE - 1-2 VIEW COMPARISON:  06/14/2014 FINDINGS: Left knee replacement is noted. Air is noted in surgical bed. No acute bony abnormality is seen. IMPRESSION: Status post left knee replacement. Electronically Signed   By: Inez Catalina M.D.   On: 07/27/2015 13:07    Impression/Recommendations Principal Problem:   Blurred vision Active Problems:  Hypertension   Paroxysmal atrial fibrillation (HCC)   S/P total knee replacement   Anemia  #1. Blurred vision. Patient reports 27 year history of same. CT of the head without acute abnormality. Vital signs stable he is afebrile no signs symptoms of infection. No metabolic derangement. Neurological exam within the limits of normal. Discussed with patient he reiterates the 2 episodes he had while hospitalized with the same as the episodes he's been experiencing for the last 27 years. Don't feel further neurological workup indicated at this time -Discharge to facility -Follow-up with PCP -CBC 3 days to trend hemoglobin  2. Anemia. Likely related to acute blood loss from surgery. Hemoglobin 7.7 at discharge. Hemoglobin prior surgery 11.8. No signs symptoms of active bleeding. He is on Lovenox with Coumadin bridge per or thyroid discharge -Recommend CBC 3 days to trend hemoglobin   #3. Hypertension. Controlled  #4. A fib [place] vaxcscore 4. Medications include Coumadin and statin -anticoagulation per orthopedics -continue statin  #5. Status post total knee replacement. Pain controlled Per orthopedics    Thank you for this consultation.  Our [facility] hospitalist team will follow the patient with you.   Time Spent: 31 minutes  Radene Gunning M.D. [facility] 07/29/2015, 11:14 AM

## 2015-07-29 NOTE — Progress Notes (Signed)
Pt requesting aspirin at this time, however medication not ordered for pt. Upon telling pt this, pt stated that we needed to call Dr. Percell Miller and that Dr. Percell Miller said "if I needed one to ask for one." This RN asked for clarification as to why pt felt he needed aspirin at this time. Pt said he was experiencing blurry vision and without the aspirin he would have a stroke. Pt clarified that this was the same symptoms he experienced before his known documented stroke. Pt also stated he was feeling light headed at this time. On call PA notified. Verbal orders received for 325mg  PO aspirin and to notify rapid response if symptoms continue. Will administer and continue to monitor.

## 2015-07-29 NOTE — Progress Notes (Signed)
Subjective: 2 Days Post-Op Procedure(s) (LRB): TOTAL LEFT KNEE ARTHROPLASTY (Left) Patient reports pain as mild.  Patient experience another episode of blurry vision earlier this am.  Resolved within 10 minutes of receiving asa 325. States this happens at hope at least once per month when his blood gets thick.  Also stated this happened prior to his TIA.  All other stroke tests have been normal.  No chest pain/palpitations or sob.  No nausea/vomiting, lightheadedness/dizziness.    Objective: Vital signs in last 24 hours: Temp:  [98.5 F (36.9 C)-100.5 F (38.1 C)] 100.5 F (38.1 C) (04/28 0438) Pulse Rate:  [93-118] 108 (04/28 0438) Resp:  [16-18] 18 (04/28 0438) BP: (113-120)/(49-62) 114/50 mmHg (04/28 0438) SpO2:  [90 %-96 %] 90 % (04/28 0438)  Intake/Output from previous day: 04/27 0701 - 04/28 0700 In: 4111.7 [P.O.:1200; I.V.:2811.7; IV Piggyback:100] Out: 1750 [Urine:1750] Intake/Output this shift: Total I/O In: -  Out: 150 [Urine:150]   Recent Labs  07/27/15 1541 07/28/15 0538 07/29/15 0527  HGB 11.8* 9.1* 7.7*    Recent Labs  07/28/15 0538 07/29/15 0527  WBC 6.0 6.4  RBC 3.11* 2.61*  HCT 28.4* 24.3*  PLT 106* 100*    Recent Labs  07/28/15 0538 07/29/15 0527  NA 139 138  K 4.5 4.0  CL 105 103  CO2 25 27  BUN 21* 19  CREATININE 0.92 0.99  GLUCOSE 143* 127*  CALCIUM 8.1* 8.3*    Recent Labs  07/27/15 0743 07/29/15 0527  INR 1.10 1.30    Neurologically intact Neurovascular intact Sensation intact distally Intact pulses distally Incision: moderate drainage Compartment soft  Drop foot on right LLE-EHL/FHL intact  Assessment/Plan: 2 Days Post-Op Procedure(s) (LRB): TOTAL LEFT KNEE ARTHROPLASTY (Left) Advance diet Up with therapy  WBAT LLE ABLA-mildy symptomatic Will consult medicine about blurred vision x 2 episodes D/C to SNF once cleared by medicine and approved by insurance   Fannie Knee 07/29/2015, 8:18 AM

## 2015-07-29 NOTE — Progress Notes (Signed)
ANTICOAGULATION CONSULT NOTE  Pharmacy Consult:  Coumadin Indication: atrial fibrillation and VTE prophylaxis s/p left TKA  No Known Allergies  Patient Measurements: Weight: 228 lb (103.42 kg)  Vital Signs: Temp: 100.5 F (38.1 C) (04/28 0438) Temp Source: Oral (04/28 0438) BP: 114/50 mmHg (04/28 0438) Pulse Rate: 108 (04/28 0438)  Labs:  Recent Labs  07/27/15 0743  07/27/15 1541 07/28/15 0538 07/29/15 0527  HGB  --   < > 11.8* 9.1* 7.7*  HCT  --   --  36.5* 28.4* 24.3*  PLT  --   --  181 106* 100*  LABPROT 14.4  --   --   --  16.3*  INR 1.10  --   --   --  1.30  CREATININE  --   --  0.91 0.92 0.99  < > = values in this interval not displayed.  Estimated Creatinine Clearance: 75.2 mL/min (by C-G formula based on Cr of 0.99).   Medical History: Past Medical History  Diagnosis Date  . Coronary artery disease     Multivessel s/p CABG 1981 at [facility]  . Essential hypertension   . Myocardial infarction ([place]) 1980  . Arthritis   . Hematuria   . Bladder infection, acute   . Peripheral vascular disease ([place])   . Polio osteopathy of lower leg ([place]) Age 61    Affected right leg  . TIA (transient ischemic attack)     Chronic coumadin  . Stroke ([place])   . Shortness of breath dyspnea   . Pneumonia   . Headache       Assessment: 80 YOM to resume Coumadin for history of Afib and VTE prophylaxis s/p left TKA.  He is also started on Lovenox post-op.  Patient's INR is currently at baseline since his last Coumadin dose was on 07/23/15.  No bleeding reported. Hgb 7.7 today and plts 100, team aware and per RN pt is only mildly symptomatic.   Goal of Therapy:  INR 2-3    Plan:  - Coumadin 10mg  PO today - Lovenox 30mg  SQ Q12H until INR therapeutic - Daily PT / INR - Monitor CBC   Thank you for allowing [place] to participate in this patients care. Jens Som, PharmD Pager: [phone]  07/29/2015, 10:41 AM

## 2015-07-29 NOTE — Clinical Social Work Note (Signed)
Clinical Social Work Assessment  Patient Details  Name: Benjamin Cordova MRN: [id number] Date of Birth: 1934-04-26  Date of referral:  07/29/15               Reason for consult:  Facility Placement                Permission sought to share information with:  Chartered certified accountant granted to share information::  Yes, Verbal Permission Granted  Name::        Agency::  [facility]  Relationship::     Contact Information:     Housing/Transportation Living arrangements for the past 2 months:  Single Family Home Source of Information:  Patient Patient Interpreter Needed:  None Criminal Activity/Legal Involvement Pertinent to Current Situation/Hospitalization:  No - Comment as needed Significant Relationships:  Spouse Lives with:  Spouse Do you feel safe going back to the place where you live?  Yes Need for family participation in patient care:  No (Coment) (Patient able to make own decisions.)  Care giving concerns:  Patient expressed no concerns at this time.   Social Worker assessment / plan:  CSW received referral for possible SNF placement at time of discharge. [name] at time of discharge. CSW confirmed with [place] patient able to admit once medically stable for discharge.  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  Patient/Family's Understanding of and Emotional Response to Diagnosis, Current Treatment, and Prognosis:  Patient understanding and agreeable to CSW plan of care.  Emotional Assessment Appearance:  Appears stated age Attitude/Demeanor/Rapport:  Other (appropriate) Affect (typically observed):  Accepting, Appropriate, Pleasant Orientation:   Oriented to Self, Oriented to Place, Oriented to  Time, Oriented to Situation Alcohol / Substance use:  Not Applicable Psych involvement (Current and /or in the community):  No (Comment) (Not appropriate on this admission.)  Discharge Needs  Concerns to be addressed:  No discharge needs identified Readmission within the last 30 days:  No Current discharge risk:  None Barriers to Discharge:  No Barriers Identified   Caroline Sauger, LCSW 07/29/2015, 12:07 PM [phone]

## 2015-07-30 LAB — CBC
HEMATOCRIT: 24 % — AB (ref 39.0–52.0)
Hemoglobin: 7.5 g/dL — ABNORMAL LOW (ref 13.0–17.0)
MCH: 29.3 pg (ref 26.0–34.0)
MCHC: 31.3 g/dL (ref 30.0–36.0)
MCV: 93.8 fL (ref 78.0–100.0)
Platelets: 106 10*3/uL — ABNORMAL LOW (ref 150–400)
RBC: 2.56 MIL/uL — ABNORMAL LOW (ref 4.22–5.81)
RDW: 14 % (ref 11.5–15.5)
WBC: 8.3 10*3/uL (ref 4.0–10.5)

## 2015-07-30 LAB — BASIC METABOLIC PANEL
Anion gap: 10 (ref 5–15)
BUN: 23 mg/dL — ABNORMAL HIGH (ref 6–20)
CALCIUM: 8.4 mg/dL — AB (ref 8.9–10.3)
CO2: 22 mmol/L (ref 22–32)
CREATININE: 0.96 mg/dL (ref 0.61–1.24)
Chloride: 105 mmol/L (ref 101–111)
GFR calc Af Amer: >60 mL/min (ref >60)
GLUCOSE: 118 mg/dL — AB (ref 65–99)
Potassium: 4.2 mmol/L (ref 3.5–5.1)
Sodium: 137 mmol/L (ref 135–145)

## 2015-07-30 LAB — PROTIME-INR
INR: 1.41 (ref 0.00–1.49)
PROTHROMBIN TIME: 17.3 s — AB (ref 11.6–15.2)

## 2015-07-30 LAB — PREPARE RBC (CROSSMATCH)

## 2015-07-30 MED ORDER — DIPHENHYDRAMINE HCL 25 MG PO CAPS
25.0000 mg | ORAL_CAPSULE | Freq: Once | ORAL | Status: AC
  Administered 2015-07-30: 25 mg via ORAL
  Filled 2015-07-30: qty 1

## 2015-07-30 MED ORDER — WARFARIN SODIUM 5 MG PO TABS
5.0000 mg | ORAL_TABLET | Freq: Once | ORAL | Status: AC
  Administered 2015-07-30: 5 mg via ORAL
  Filled 2015-07-30: qty 1

## 2015-07-30 MED ORDER — FUROSEMIDE 10 MG/ML IJ SOLN
20.0000 mg | Freq: Once | INTRAMUSCULAR | Status: AC
  Administered 2015-07-30: 20 mg via INTRAVENOUS
  Filled 2015-07-30: qty 2

## 2015-07-30 MED ORDER — ACETAMINOPHEN 325 MG PO TABS
650.0000 mg | ORAL_TABLET | Freq: Once | ORAL | Status: AC
  Administered 2015-07-30: 650 mg via ORAL
  Filled 2015-07-30: qty 2

## 2015-07-30 MED ORDER — IBUPROFEN 200 MG PO TABS
200.0000 mg | ORAL_TABLET | Freq: Once | ORAL | Status: AC
  Administered 2015-07-30: 200 mg via ORAL
  Filled 2015-07-30: qty 1

## 2015-07-30 MED ORDER — SODIUM CHLORIDE 0.9 % IV SOLN
Freq: Once | INTRAVENOUS | Status: AC
  Administered 2015-07-30: 13:00:00 via INTRAVENOUS

## 2015-07-30 NOTE — Progress Notes (Addendum)
Addendum- per RN- pt will be getting 2 units of blood today which will not end till this evening- plan for DC tomorrow if pt is stable  10:40am Patient will discharge to [facility] Anticipated discharge date: 4/29 Family notified:pt to notify Transportation by PTAR- called at 10:45am  CSW signing off.  Domenica Reamer, [place] Social Worker [phone]

## 2015-07-30 NOTE — Progress Notes (Signed)
Physical Therapy Treatment Patient Details Name: Benjamin Cordova MRN: [id number] DOB: May 09, 1934 Today's Date: 08-20-2015    History of Present Illness Pt is 81 yo male with left TKA, PMH: MI, CVA, polio age 11, affected RLE, OA    PT Comments    Patient receiving blood - just initiated.  Agreed to performing exercises in bed.  Will return tomorrow for mobility.  Follow Up Recommendations  SNF     Equipment Recommendations  None recommended by PT    Recommendations for Other Services       Precautions / Restrictions Precautions Precautions: Knee Required Braces or Orthoses: Knee Immobilizer - Left (Ace wrapped Lt foot into dorsiflexion) Knee Immobilizer - Left: Other (comment) (Not specified) Restrictions Weight Bearing Restrictions: Yes LLE Weight Bearing: Weight bearing as tolerated    Mobility  Bed Mobility               General bed mobility comments: Declined - receiving blood - just initiated.  Transfers                    Ambulation/Gait                 Stairs            Wheelchair Mobility    Modified Rankin (Stroke Patients Only)       Balance                                    Cognition Arousal/Alertness: Awake/alert Behavior During Therapy: WFL for tasks assessed/performed Overall Cognitive Status: Within Functional Limits for tasks assessed                      Exercises Total Joint Exercises Ankle Circles/Pumps: AROM;Both;10 reps;Supine Quad Sets: AROM;Both;20 reps;Supine Gluteal Sets: AROM;Both;10 reps;Supine Short Arc Quad: AROM;Left;10 reps;Supine Heel Slides: AAROM;Both;10 reps;Supine Hip ABduction/ADduction: AAROM;Both;10 reps;Supine    General Comments        Pertinent Vitals/Pain Pain Assessment: Faces Faces Pain Scale: Hurts a little bit Pain Location: Lt knee Pain Descriptors / Indicators: Sore Pain Intervention(s): Monitored during session;Repositioned    Home Living                       Prior Function            PT Goals (current goals can now be found in the care plan section) Progress towards PT goals: Progressing toward goals    Frequency  7X/week    PT Plan Current plan remains appropriate    Co-evaluation             End of Session   Activity Tolerance: Patient tolerated treatment well Patient left: in bed;with call bell/phone within reach;with nursing/sitter in room     Time: 1655-1706 PT Time Calculation (min) (ACUTE ONLY): 11 min  Charges:  $Therapeutic Exercise: 8-22 mins                    G Codes:      Despina Pole 2015-08-20, 5:47 PM Carita Pian. Sanjuana Kava, [place] Pager [phone]

## 2015-07-30 NOTE — Progress Notes (Signed)
Pt with repeat episode of blurred vision. Requesting ibuprofen to resolve. [facility] paged as they were consulted for these episodes. Received orders for PO ibuprofen. Will administer and continue to monitor.

## 2015-07-30 NOTE — Progress Notes (Signed)
Orthopaedic Trauma Service (OTS)  Subjective: 3 Days Post-Op Procedure(s) (LRB): TOTAL LEFT KNEE ARTHROPLASTY (Left) Patient reports pain as improving.  C/o difficulty with ambulation secondary to polio on the right with weakness.  Objective: Current Vitals Blood pressure 108/55, pulse 97, temperature 99.9 F (37.7 C), temperature source Oral, resp. rate 16, weight 228 lb (103.42 kg), SpO2 96 %. Vital signs in last 24 hours: Temp:  [99.8 F (37.7 C)-99.9 F (37.7 C)] 99.9 F (37.7 C) (04/29 0435) Pulse Rate:  [97-119] 97 (04/29 0435) Resp:  [16-20] 16 (04/29 0435) BP: (92-120)/(55-64) 108/55 mmHg (04/29 0435) SpO2:  [95 %-98 %] 96 % (04/29 0435)  Intake/Output from previous day: 04/28 0701 - 04/29 0700 In: 1800 [P.O.:600; I.V.:1200] Out: 775 [Urine:775]  LABS  Recent Labs  07/27/15 1541 07/28/15 0538 07/29/15 0527 07/30/15 0220  HGB 11.8* 9.1* 7.7* 7.5*    Recent Labs  07/29/15 0527 07/30/15 0220  WBC 6.4 8.3  RBC 2.61* 2.56*  HCT 24.3* 24.0*  PLT 100* 106*    Recent Labs  07/29/15 0527 07/30/15 0220  NA 138 137  K 4.0 4.2  CL 103 105  CO2 27 22  BUN 19 23*  CREATININE 0.99 0.96  GLUCOSE 127* 118*  CALCIUM 8.3* 8.4*    Recent Labs  07/29/15 0527 07/30/15 0220  INR 1.30 1.41    Physical Exam  Lungs CTA LLE Dressing intact, slight drainage, dry  Edema/ swelling controlled  Sens: DPN, SPN, TN intact  Motor: EHL, FHL, and lessor toe ext and flex all intact grossly  Brisk cap refill, warm to touch  Imaging Ct Head Wo Contrast  07/29/2015  CLINICAL DATA:  Blurred vision EXAM: CT HEAD WITHOUT CONTRAST TECHNIQUE: Contiguous axial images were obtained from the base of the skull through the vertex without intravenous contrast. COMPARISON:  None. FINDINGS: Brain: No intracranial hemorrhage, mass effect or midline shift. There is large area of encephalomalacia in right anterior temporal and right frontal lobe most likely from previous infarct  measures at least 5 cm. Mild cerebral atrophy. Mild periventricular white matter decreased attenuation probable due to chronic small vessel ischemic changes. No definite acute cortical infarction. No mass lesion is noted on this unenhanced scan. Vascular: Atherosclerotic calcifications of carotid siphon. Skull: No skull fracture is noted. Sinuses/Orbits: There is probable mucous retention cyst medial aspect of the left maxillary sinus measures mild 9 mm. The mastoid air cells are unremarkable. Other: None IMPRESSION: No acute intracranial abnormality. There is encephalomalacia in right temporal and right frontal lobe probable from prior infarct. Mild cerebral atrophy. Mild periventricular and patchy subcortical white matter decreased attenuation probable due to chronic small vessel ischemic changes. No definite acute cortical infarction. Electronically Signed   By: Lahoma Crocker M.D.   On: 07/29/2015 09:56    Assessment/Plan: 3 Days Post-Op Procedure(s) (LRB): TOTAL LEFT KNEE ARTHROPLASTY (Left) Acute blood loss anemia for 2 [facility] PT/OT Lovenox SNF anticipated  Altamese Montgomery, MD [facility] [phone] (p)   07/30/2015, 10:01 AM

## 2015-07-30 NOTE — Progress Notes (Signed)
ANTICOAGULATION CONSULT NOTE  Pharmacy Consult:  Coumadin Indication: atrial fibrillation and VTE prophylaxis s/p left TKA  No Known Allergies  Patient Measurements: Weight: 228 lb (103.42 kg)  Vital Signs: Temp: 98.3 F (36.8 C) (04/29 1624) Temp Source: Oral (04/29 1624) BP: 88/47 mmHg (04/29 1624) Pulse Rate: 83 (04/29 1624)  Labs:  Recent Labs  07/28/15 0538 07/29/15 0527 07/30/15 0220  HGB 9.1* 7.7* 7.5*  HCT 28.4* 24.3* 24.0*  PLT 106* 100* 106*  LABPROT  --  16.3* 17.3*  INR  --  1.30 1.41  CREATININE 0.92 0.99 0.96    Estimated Creatinine Clearance: 77.6 mL/min (by C-G formula based on Cr of 0.96).   Medical History: Past Medical History  Diagnosis Date  . Coronary artery disease     Multivessel s/p CABG 1981 at [facility]  . Essential hypertension   . Myocardial infarction ([place]) 1980  . Arthritis   . Hematuria   . Bladder infection, acute   . Peripheral vascular disease ([place])   . Polio osteopathy of lower leg ([place]) Age 82    Affected right leg  . TIA (transient ischemic attack)     Chronic coumadin  . Stroke ([place])   . Shortness of breath dyspnea   . Pneumonia   . Headache       Assessment: 80 YOM to resume Coumadin for history of Afib and VTE prophylaxis s/p left TKA.  He is also started on Lovenox post-op.  Patient's INR is currently at baseline since his last Coumadin dose was on 07/23/15.  No bleeding reported. Hgb 7.7 on 4/28 and plts 100,  team aware and per RN pt is only mildly symptomatic.  Today POD#3  4/29 INR = 1.41 (increase from 1.3>1.41),  Hgb down to 7.5 (preop Hgb 11.8), pltc 106K  Ortho MD notes that LLEDressing intact, slight drainage, dry and patient with Acute blood loss anemia, to give  2units PRBC today. Ortho continues lovenox 30mg  SQ q12h bridge to coumadin.  He was taking coumadin 10mg  daily PTA. Resumed with 10mg  coumadin last night. I will lower dose today due to acute blood loss anemia.   PTA coumadin dose (for h/o  Afib): 10 mg po daily, last taken PTA on 07/23/15.  Goal of Therapy:  INR 2-3    Plan:  - Coumadin 5mg  PO today - Lovenox 30mg  SQ Q12H until INR therapeutic - Daily PT / INR - Monitor CBC   Thank you for allowing [place] to participate in this patients care. Nicole Cella, RPh Clinical Pharmacist Pager: [phone] 07/30/2015, 4:56 PM

## 2015-07-30 NOTE — Progress Notes (Signed)
Orthopedic Tech Progress Note Patient Details:  Benjamin Cordova 05-11-1934 [id number]  CPM Left Knee CPM Left Knee: On Left Knee Flexion (Degrees): 40 Left Knee Extension (Degrees): 0 Additional Comments: trapeze bar patient helper   [name] 07/30/2015, 5:24 AM

## 2015-07-31 ENCOUNTER — Inpatient Hospital Stay (HOSPITAL_COMMUNITY): Payer: Commercial Managed Care - HMO

## 2015-07-31 DIAGNOSIS — R609 Edema, unspecified: Secondary | ICD-10-CM

## 2015-07-31 LAB — PROTIME-INR
INR: 1.37 (ref 0.00–1.49)
PROTHROMBIN TIME: 16.9 s — AB (ref 11.6–15.2)

## 2015-07-31 LAB — BASIC METABOLIC PANEL
ANION GAP: 8 (ref 5–15)
BUN: 29 mg/dL — AB (ref 4–21)
BUN: 29 mg/dL — ABNORMAL HIGH (ref 6–20)
CALCIUM: 8.2 mg/dL — AB (ref 8.9–10.3)
CO2: 25 mmol/L (ref 22–32)
Chloride: 104 mmol/L (ref 101–111)
Creatinine, Ser: 1.05 mg/dL (ref 0.61–1.24)
Creatinine: 1 mg/dL (ref 0.6–1.3)
Glucose, Bld: 106 mg/dL — ABNORMAL HIGH (ref 65–99)
Glucose: 106 mg/dL
POTASSIUM: 4.1 mmol/L (ref 3.5–5.1)
SODIUM: 137 mmol/L (ref 137–147)
SODIUM: 137 mmol/L (ref 135–145)

## 2015-07-31 LAB — CBC
HEMATOCRIT: 27.8 % — AB (ref 39.0–52.0)
Hemoglobin: 9.2 g/dL — ABNORMAL LOW (ref 13.0–17.0)
MCH: 30.2 pg (ref 26.0–34.0)
MCHC: 33.1 g/dL (ref 30.0–36.0)
MCV: 91.1 fL (ref 78.0–100.0)
Platelets: 121 10*3/uL — ABNORMAL LOW (ref 150–400)
RBC: 3.05 MIL/uL — ABNORMAL LOW (ref 4.22–5.81)
RDW: 14.9 % (ref 11.5–15.5)
WBC: 8.2 10*3/uL (ref 4.0–10.5)

## 2015-07-31 LAB — TYPE AND SCREEN
ABO/RH(D): O POS
ANTIBODY SCREEN: NEGATIVE
Unit division: 0
Unit division: 0

## 2015-07-31 LAB — HEMOGLOBIN AND HEMATOCRIT, BLOOD
HEMATOCRIT: 26.7 % — AB (ref 39.0–52.0)
HEMOGLOBIN: 8.6 g/dL — AB (ref 13.0–17.0)

## 2015-07-31 LAB — CBC AND DIFFERENTIAL: WBC: 8.2 10^3/mL

## 2015-07-31 MED ORDER — IBUPROFEN 200 MG PO TABS: 400.0000 mg | ORAL_TABLET | Freq: Every day | ORAL | Status: DC | PRN

## 2015-07-31 MED ORDER — ASPIRIN 325 MG PO TABS
325.0000 mg | ORAL_TABLET | Freq: Every day | ORAL | Status: DC | PRN
  Administered 2015-07-31: 325 mg via ORAL
  Filled 2015-07-31: qty 1

## 2015-07-31 MED ORDER — WARFARIN SODIUM 7.5 MG PO TABS
12.5000 mg | ORAL_TABLET | Freq: Once | ORAL | Status: AC
  Administered 2015-07-31: 12.5 mg via ORAL
  Filled 2015-07-31: qty 1

## 2015-07-31 NOTE — Progress Notes (Signed)
Physical Therapy Treatment Patient Details Name: Benjamin Cordova MRN: [id number] DOB: Apr 11, 1934 Today's Date: 07/31/2015    History of Present Illness Pt is 81 yo male with left TKA, PMH: MI, CVA, polio age 63, affected RLE, OA    PT Comments    Pt reports frustration over his evening and not being able to get assist. Pt educated for HEp, transfers and gait progression but pt denied attempting further distance today due to fatigue and pain but very happy to be OOB. Will continue to follow.   Follow Up Recommendations  SNF;Supervision/Assistance - 24 hour     Equipment Recommendations       Recommendations for Other Services       Precautions / Restrictions Precautions Precautions: Knee;Fall Precaution Comments: reviewed precaution and positioning, RLE basline weakness Required Braces or Orthoses: Knee Immobilizer - Left Knee Immobilizer - Left:  (in room but no order) Restrictions LLE Weight Bearing: Weight bearing as tolerated    Mobility  Bed Mobility Overal bed mobility: Needs Assistance Bed Mobility: Supine to Sit     Supine to sit: Min assist;HOB elevated     General bed mobility comments: cues for sequence with use of rail to pull and pivot to EOB, min assist with pad and cues for reciprocal scooting to achieve EOB fully  Transfers Overall transfer level: Needs assistance   Transfers: Sit to/from Stand Sit to Stand: Min assist;+2 physical assistance;From elevated surface         General transfer comment: max cues for hand  and bil LE positioning with bed height elevated to rise from surface. Pt able to clear buttock but maintaining flexed posture and unable to erect trunk so pt returned to sitting. 2nd trial able to achieve more fully upright by moving RW forward for pt to gain balance as he was unable to adjust feet or trunk independently.   Ambulation/Gait Ambulation/Gait assistance: Min assist;+2 safety/equipment Ambulation Distance (Feet): 3  Feet Assistive device: Rolling walker (2 wheeled) Gait Pattern/deviations: Shuffle;Trunk flexed   Gait velocity interpretation: Below normal speed for age/gender General Gait Details: cues for posture, assist to advance RW, cues for sequence   Stairs            Wheelchair Mobility    Modified Rankin (Stroke Patients Only)       Balance Overall balance assessment: Needs assistance   Sitting balance-Leahy Scale: Poor       Standing balance-Leahy Scale: Poor                      Cognition Arousal/Alertness: Awake/alert Behavior During Therapy: WFL for tasks assessed/performed Overall Cognitive Status: Impaired/Different from baseline Area of Impairment: Problem solving             Problem Solving: Slow processing;Requires verbal cues;Requires tactile cues      Exercises Total Joint Exercises Heel Slides: AAROM;Left;15 reps;Supine Hip ABduction/ADduction: AROM;Left;15 reps;Supine Straight Leg Raises: AAROM;Left;15 reps;Supine Goniometric ROM: 12-50    General Comments        Pertinent Vitals/Pain Pain Assessment: 0-10 Pain Score: 5  Pain Location: left leg Pain Descriptors / Indicators: Sore Pain Intervention(s): Limited activity within patient's tolerance;Monitored during session;Repositioned;Ice applied    Home Living                      Prior Function            PT Goals (current goals can now be found in the care plan section) Progress  towards PT goals: Progressing toward goals    Frequency       PT Plan Current plan remains appropriate    Co-evaluation             End of Session Equipment Utilized During Treatment: Gait belt;Left knee immobilizer Activity Tolerance: Patient tolerated treatment well Patient left: in chair;with call bell/phone within reach     Time: 0843-0904 PT Time Calculation (min) (ACUTE ONLY): 21 min  Charges:  $Therapeutic Activity: 8-22 mins                    G Codes:       Melford Aase 07/31/2015, 9:46 AM Elwyn Reach, [place]

## 2015-07-31 NOTE — Progress Notes (Signed)
VASCULAR LAB PRELIMINARY  PRELIMINARY  PRELIMINARY  PRELIMINARY  Bilateral lower extremity venous duplex completed.    Preliminary report:  There is no DVT or SVT noted in the bilateral lower extremities.   Analissa Bayless, RVT 07/31/2015, 12:09 PM

## 2015-07-31 NOTE — Progress Notes (Signed)
ANTICOAGULATION CONSULT NOTE  Pharmacy Consult:  Coumadin Indication: atrial fibrillation and VTE prophylaxis s/p left TKA  No Known Allergies  Patient Measurements: Weight: 228 lb (103.42 kg)  Vital Signs: Temp: 99.6 F (37.6 C) (04/30 0536) Temp Source: Oral (04/30 0536) BP: 108/73 mmHg (04/30 0536) Pulse Rate: 87 (04/30 0536)  Labs:  Recent Labs  07/29/15 0527 07/30/15 0220 07/31/15 0030 07/31/15 0315  HGB 7.7* 7.5* 8.6* 9.2*  HCT 24.3* 24.0* 26.7* 27.8*  PLT 100* 106*  --  121*  LABPROT 16.3* 17.3*  --  16.9*  INR 1.30 1.41  --  1.37  CREATININE 0.99 0.96  --  1.05    Estimated Creatinine Clearance: 70.9 mL/min (by C-G formula based on Cr of 1.05).   Medical History: Past Medical History  Diagnosis Date  . Coronary artery disease     Multivessel s/p CABG 1981 at [facility]  . Essential hypertension   . Myocardial infarction ([place]) 1980  . Arthritis   . Hematuria   . Bladder infection, acute   . Peripheral vascular disease ([place])   . Polio osteopathy of lower leg ([place]) Age 80    Affected right leg  . TIA (transient ischemic attack)     Chronic coumadin  . Stroke ([place])   . Shortness of breath dyspnea   . Pneumonia   . Headache       Assessment: 80 YOM to resume Coumadin for history of Afib and VTE prophylaxis s/p left TKA.  He is also started on Lovenox post-op.  Patient's INR is currently at baseline since his last Coumadin dose was on 07/23/15.  No bleeding reported. Hgb 7.7 on 4/28 and plts 100,  team aware and per RN pt is only mildly symptomatic.  Today POD#4 INR = 1.37 (increase from 1.3>1.41),  Hgb down to 7.5 (preop Hgb 11.8), pltc 106K  Ortho MD notes that LLEDressing intact, slight drainage, dry and patient with Acute blood loss anemia, to give  2units PRBC today. Ortho continues lovenox 30mg  SQ q12h bridge to coumadin.  He was taking coumadin 10mg  daily PTA. Since INR dropped slightly this AM, will increase the dose today.   PTA coumadin dose  (for h/o Afib): 10 mg po daily, last taken PTA on 07/23/15.  Goal of Therapy:  INR 2-3    Plan:   - Coumadin 12.5mg  PO x1 - Lovenox 30mg  SQ Q12H until INR therapeutic - Daily PT / INR - Monitor CBC   Onnie Boer, PharmD Pager: [phone] 07/31/2015 8:05 AM

## 2015-07-31 NOTE — Progress Notes (Signed)
Orthopaedic Trauma Service (OTS)  Subjective: 4 Days Post-Op Procedure(s) (LRB): TOTAL LEFT KNEE ARTHROPLASTY (Left) Patient reports pain as moderate.   Feels that he is doing worse with increased pain and swelling in the left leg. + flatus, no BM.  Objective: Current Vitals Blood pressure 108/73, pulse 87, temperature 99.6 F (37.6 C), temperature source Oral, resp. rate 16, weight 228 lb (103.42 kg), SpO2 97 %. Vital signs in last 24 hours: Temp:  [97.2 F (36.2 C)-101.8 F (38.8 C)] 99.6 F (37.6 C) (04/30 0536) Pulse Rate:  [65-96] 87 (04/30 0536) Resp:  [16-18] 16 (04/30 0536) BP: (88-114)/(47-80) 108/73 mmHg (04/30 0536) SpO2:  [94 %-100 %] 97 % (04/30 0536)  Intake/Output from previous day: 04/29 0701 - 04/30 0700 In: 1030 [P.O.:360; Blood:670] Out: 400 [Urine:400]  LABS  Recent Labs  07/29/15 0527 07/30/15 0220 07/31/15 0030 07/31/15 0315  HGB 7.7* 7.5* 8.6* 9.2*    Recent Labs  07/30/15 0220 07/31/15 0030 07/31/15 0315  WBC 8.3  --  8.2  RBC 2.56*  --  3.05*  HCT 24.0* 26.7* 27.8*  PLT 106*  --  121*    Recent Labs  07/30/15 0220 07/31/15 0315  NA 137 137  K 4.2 4.1  CL 105 104  CO2 22 25  BUN 23* 29*  CREATININE 0.96 1.05  GLUCOSE 118* 106*  CALCIUM 8.4* 8.2*    Recent Labs  07/30/15 0220 07/31/15 0315  INR 1.41 1.37    Physical Exam  LLE Dressing not changed as dry and because steristrips were adherent to drsg; wound intact, clean, no erythema, slight serous drainage  Edema/ swelling increased from yesterday; no TED in place  Sens: DPN, SPN, TN intact  Motor: EHL, FHL, and lessor toe ext and flex all intact grossly  Brisk cap refill, warm to touch, DP 2+  Imaging No results found.  Assessment/Plan: 4 Days Post-Op Procedure(s) (LRB): TOTAL LEFT KNEE ARTHROPLASTY (Left) 1. Reordered TED hose but increased to thigh high; will obtain DVT u/s today 2. Lovenox, coumadin 3. PT, OT 4. D/c planning  Altamese [place],  MD [facility] [phone] (p)   07/31/2015, 10:01 AM

## 2015-07-31 NOTE — Progress Notes (Signed)
Orthopedic Tech Progress Note Patient Details:  Benjamin Cordova 08-07-1934 [id number]  Patient ID: [name], male   DOB: [birth year], 81 y.o.   MRN: [id number] Placed pt's lle on cpm @0 -40 degrees @1345 ; will increase as pt tolerates; RN notified  Hildred Priest 07/31/2015, 1:44 PM

## 2015-08-01 DIAGNOSIS — I480 Paroxysmal atrial fibrillation: Secondary | ICD-10-CM | POA: Diagnosis not present

## 2015-08-01 DIAGNOSIS — Z471 Aftercare following joint replacement surgery: Secondary | ICD-10-CM | POA: Diagnosis not present

## 2015-08-01 DIAGNOSIS — E785 Hyperlipidemia, unspecified: Secondary | ICD-10-CM | POA: Diagnosis not present

## 2015-08-01 DIAGNOSIS — R791 Abnormal coagulation profile: Secondary | ICD-10-CM | POA: Diagnosis not present

## 2015-08-01 DIAGNOSIS — I2510 Atherosclerotic heart disease of native coronary artery without angina pectoris: Secondary | ICD-10-CM | POA: Diagnosis not present

## 2015-08-01 DIAGNOSIS — K5901 Slow transit constipation: Secondary | ICD-10-CM | POA: Diagnosis not present

## 2015-08-01 DIAGNOSIS — M25569 Pain in unspecified knee: Secondary | ICD-10-CM | POA: Diagnosis not present

## 2015-08-01 DIAGNOSIS — Z79899 Other long term (current) drug therapy: Secondary | ICD-10-CM | POA: Diagnosis not present

## 2015-08-01 DIAGNOSIS — M1712 Unilateral primary osteoarthritis, left knee: Secondary | ICD-10-CM | POA: Diagnosis not present

## 2015-08-01 DIAGNOSIS — R2681 Unsteadiness on feet: Secondary | ICD-10-CM | POA: Diagnosis not present

## 2015-08-01 DIAGNOSIS — I10 Essential (primary) hypertension: Secondary | ICD-10-CM | POA: Diagnosis not present

## 2015-08-01 DIAGNOSIS — R262 Difficulty in walking, not elsewhere classified: Secondary | ICD-10-CM | POA: Diagnosis not present

## 2015-08-01 DIAGNOSIS — R269 Unspecified abnormalities of gait and mobility: Secondary | ICD-10-CM | POA: Diagnosis not present

## 2015-08-01 DIAGNOSIS — M6281 Muscle weakness (generalized): Secondary | ICD-10-CM | POA: Diagnosis not present

## 2015-08-01 DIAGNOSIS — D62 Acute posthemorrhagic anemia: Secondary | ICD-10-CM | POA: Diagnosis not present

## 2015-08-01 DIAGNOSIS — R600 Localized edema: Secondary | ICD-10-CM | POA: Diagnosis not present

## 2015-08-01 DIAGNOSIS — D689 Coagulation defect, unspecified: Secondary | ICD-10-CM | POA: Diagnosis not present

## 2015-08-01 DIAGNOSIS — R278 Other lack of coordination: Secondary | ICD-10-CM | POA: Diagnosis not present

## 2015-08-01 DIAGNOSIS — Z96652 Presence of left artificial knee joint: Secondary | ICD-10-CM | POA: Diagnosis not present

## 2015-08-01 LAB — PROTIME-INR
INR: 1.54 — AB (ref 0.00–1.49)
Prothrombin Time: 18.5 seconds — ABNORMAL HIGH (ref 11.6–15.2)

## 2015-08-01 MED ORDER — WARFARIN SODIUM 5 MG PO TABS
10.0000 mg | ORAL_TABLET | Freq: Once | ORAL | Status: AC
  Administered 2015-08-01: 10 mg via ORAL
  Filled 2015-08-01: qty 2

## 2015-08-01 NOTE — Progress Notes (Signed)
ANTICOAGULATION CONSULT NOTE  Pharmacy Consult:  Coumadin Indication: atrial fibrillation and VTE prophylaxis s/p left TKA  No Known Allergies  Patient Measurements: Weight: 228 lb (103.42 kg)  Vital Signs: Temp: 98.8 F (37.1 C) (05/01 0417) Temp Source: Oral (05/01 0417) BP: 111/63 mmHg (05/01 0417) Pulse Rate: 80 (05/01 0417)  Labs:  Recent Labs  07/30/15 0220 07/31/15 0030 07/31/15 0315 08/01/15 0345  HGB 7.5* 8.6* 9.2*  --   HCT 24.0* 26.7* 27.8*  --   PLT 106*  --  121*  --   LABPROT 17.3*  --  16.9* 18.5*  INR 1.41  --  1.37 1.54*  CREATININE 0.96  --  1.05  --     Estimated Creatinine Clearance: 70.9 mL/min (by C-G formula based on Cr of 1.05).   Medical History: Assessment: 77 YOM to resume Coumadin for history of Afib and VTE prophylaxis s/p left TKA.  Today POD#5 INR = 1.54 trending up. hgb 9.2, pltc 121K after transfusion yesterday.  \Ortho continues lovenox 30mg  SQ q12h bridge to coumadin.   PTA coumadin dose (for h/o Afib): 10 mg po daily, last taken PTA on 07/23/15.  Goal of Therapy:  INR 2-3    Plan:  - Coumadin 10mg  PO x1 - Lovenox 30mg  SQ Q12H until INR therapeutic - Daily PT / INR - Monitor CBC   Maryanna Shape, PharmD, BCPS  Clinical Pharmacist  Pager: [phone]   08/01/2015 10:16 AM

## 2015-08-01 NOTE — Progress Notes (Signed)
Orthopedic Tech Progress Note Patient Details:  Benjamin Cordova [birth year] [id number]  CPM Left Knee CPM Left Knee: On Left Knee Flexion (Degrees): 40 Left Knee Extension (Degrees): 0 Additional Comments: trapeze bar patient helper   [name] 08/01/2015, 6:02 PM

## 2015-08-01 NOTE — Clinical Social Work Placement (Signed)
   CLINICAL SOCIAL WORK PLACEMENT  NOTE 08/01/15 - DISCHARGED [facility]  Date:  08/01/2015  Patient Details  Name: Benjamin Cordova MRN: [id number] Date of Birth: June 15, 1934  Clinical Social Work is seeking post-discharge placement for this patient at the [place] level of care (*CSW will initial, date and re-position this form in  chart as items are completed):  Yes   Patient/family provided with [place] Work Department's list of facilities offering this level of care within the geographic area requested by the patient (or if unable, by the patient's family).  Yes   Patient/family informed of their freedom to choose among providers that offer the needed level of care, that participate in Medicare, Medicaid or managed care program needed by the patient, have an available bed and are willing to accept the patient.  Yes   Patient/family informed of [facility]'s ownership interest in [facility] and [facility], as well as of the fact that they are under no obligation to receive care at these facilities.  PASRR submitted to EDS on 07/29/15     PASRR number received on 07/29/15     Existing PASRR number confirmed on       FL2 transmitted to all facilities in geographic area requested by pt/family on 07/29/15     FL2 transmitted to all facilities within larger geographic area on       Patient informed that his/her managed care company has contracts with or will negotiate with certain facilities, including the following:        Yes   Patient/family informed of bed offers received.  Patient chooses bed at [facility]     Physician recommends and patient chooses bed at      Patient to be transferred to [facility] on 07/29/15. Discharged 08/01/15  Patient to be transferred to facility by [place]     Patient family notified on 07/29/15 of transfer.  Name of family member notified:  Patient updated. 08/01/15 - CSW attempted to reach wife by  phone without success - no voice mail. Patient aware of discharge.     PHYSICIAN Please sign FL2     Additional Comment:    _______________________________________________ Sable Feil, LCSW 08/01/2015, 7:27 PM

## 2015-08-01 NOTE — Care Management Important Message (Signed)
Important Message  Patient Details  Name: Benjamin Cordova MRN: [id number] Date of Birth: 1935-02-05   Medicare Important Message Given:  Yes    Benjamin Cordova 08/01/2015, 1:58 PM

## 2015-08-01 NOTE — Progress Notes (Signed)
Physical Therapy Treatment Patient Details Name: Benjamin Cordova MRN: [id number] DOB: 09/25/1934 Today's Date: 08/01/2015    History of Present Illness Pt is 81 yo male with left TKA, PMH: MI, CVA, polio age 28, affected RLE, OA    PT Comments    Pt pleasant and willing to work with therapy. Pt with increased transfers and ambulation, but continues to require assistance with all mobility. Pt continues to have decreased strength and ROM. Will continue to follow.  Follow Up Recommendations  SNF;Supervision/Assistance - 24 hour     Equipment Recommendations       Recommendations for Other Services       Precautions / Restrictions Precautions Precautions: Knee;Fall Required Braces or Orthoses: Knee Immobilizer - Left Knee Immobilizer - Left:  (in room but no order) Restrictions Weight Bearing Restrictions: Yes LLE Weight Bearing: Weight bearing as tolerated    Mobility  Bed Mobility Overal bed mobility: Needs Assistance Bed Mobility: Supine to Sit     Supine to sit: HOB elevated;Mod assist     General bed mobility comments: cues for pushing off bed with UE with use of rail and reciprocal scooting. assist to move LLE across bed as well as scooting left hip to EOB with use of pad  Transfers Overall transfer level: Needs assistance   Transfers: Sit to/from Stand Sit to Stand: +2 physical assistance;Min assist;From elevated surface         General transfer comment: bed grossly 30 inches. verbal and tactile cues for hand placement. assist to rise and for anterior translation. pt maintains flexed posture upon standing.  Ambulation/Gait Ambulation/Gait assistance: +2 safety/equipment;Min assist Ambulation Distance (Feet): 20 Feet Assistive device: Rolling walker (2 wheeled) Gait Pattern/deviations: Step-to pattern;Trunk flexed   Gait velocity interpretation: Below normal speed for age/gender General Gait Details: cues for posture and assist to advance RW. chair to  follow.   Stairs            Wheelchair Mobility    Modified Rankin (Stroke Patients Only)       Balance   Sitting-balance support: Bilateral upper extremity supported Sitting balance-Leahy Scale: Poor     Standing balance support: Bilateral upper extremity supported Standing balance-Leahy Scale: Poor                      Cognition Arousal/Alertness: Awake/alert Behavior During Therapy: WFL for tasks assessed/performed Overall Cognitive Status: Within Functional Limits for tasks assessed                      Exercises Total Joint Exercises Quad Sets: AROM;Left;15 reps;Seated Hip ABduction/ADduction: AROM;Left;15 reps;Supine Straight Leg Raises: AAROM;Left;15 reps;Seated Knee Flexion: AROM;Left;Supine Goniometric ROM: 2-46    General Comments        Pertinent Vitals/Pain Pain Assessment: 0-10 Pain Score: 3  Pain Location: left leg Pain Descriptors / Indicators: Sore Pain Intervention(s): Limited activity within patient's tolerance;Monitored during session;Repositioned;Ice applied    Home Living                      Prior Function            PT Goals (current goals can now be found in the care plan section) Progress towards PT goals: Progressing toward goals    Frequency       PT Plan Current plan remains appropriate    Co-evaluation             End of Session Equipment Utilized During  Treatment: Gait belt;Left knee immobilizer Activity Tolerance: Patient tolerated treatment well Patient left: in chair;with call bell/phone within reach     Time: 0832-0905 PT Time Calculation (min) (ACUTE ONLY): 33 min  Charges:  $Gait Training: 8-22 mins $Therapeutic Exercise: 8-22 mins                    G Codes:      Haze Justin 2015/08/25, 10:43 AM   Haze Justin, SPT [phone]

## 2015-08-02 LAB — POCT INR: INR: 1.9 — AB (ref <=1.1)

## 2015-08-03 ENCOUNTER — Encounter: Payer: Self-pay | Admitting: Internal Medicine

## 2015-08-03 ENCOUNTER — Non-Acute Institutional Stay (SKILLED_NURSING_FACILITY): Payer: Commercial Managed Care - HMO | Admitting: Internal Medicine

## 2015-08-03 DIAGNOSIS — L89611 Pressure ulcer of right heel, stage 1: Secondary | ICD-10-CM

## 2015-08-03 DIAGNOSIS — R791 Abnormal coagulation profile: Secondary | ICD-10-CM

## 2015-08-03 DIAGNOSIS — I480 Paroxysmal atrial fibrillation: Secondary | ICD-10-CM

## 2015-08-03 DIAGNOSIS — K5901 Slow transit constipation: Secondary | ICD-10-CM

## 2015-08-03 DIAGNOSIS — R600 Localized edema: Secondary | ICD-10-CM | POA: Diagnosis not present

## 2015-08-03 DIAGNOSIS — E785 Hyperlipidemia, unspecified: Secondary | ICD-10-CM

## 2015-08-03 DIAGNOSIS — R2681 Unsteadiness on feet: Secondary | ICD-10-CM | POA: Diagnosis not present

## 2015-08-03 DIAGNOSIS — I10 Essential (primary) hypertension: Secondary | ICD-10-CM | POA: Diagnosis not present

## 2015-08-03 DIAGNOSIS — M1712 Unilateral primary osteoarthritis, left knee: Secondary | ICD-10-CM | POA: Diagnosis not present

## 2015-08-03 DIAGNOSIS — D62 Acute posthemorrhagic anemia: Secondary | ICD-10-CM

## 2015-08-03 LAB — POCT INR: INR: 1.8 — AB (ref <=1.1)

## 2015-08-03 NOTE — Progress Notes (Signed)
LOCATION: [facility]  PCP: Wende Neighbors, MD   Code Status: Full Code  Goals of care: Advanced Directive information Advanced Directives 07/27/2015  Does patient have an advance directive? No  Would patient like information on creating an advanced directive? No - patient declined information       Extended Emergency Contact Information Primary Emergency Contact: Piehl,Glenda Address: [address]          [place], [place] [address] [place] Phone: [phone] Relation: Spouse   No Known Allergies  Chief Complaint  Patient presents with  . New Admit To SNF    New Admission     HPI:  Patient is a 81 y.o. male seen today for short term rehabilitation post hospital admission from 07/27/15-08/01/15 with left knee OA. He underwent left total knee arthroplasty. He had 2 u prbc transfusion for ABLA. He is seen in his room today. His pain is under control with current pain regimen. He had a bowel movement this am after several days.   Review of Systems:  Constitutional: Negative for fever, chills, diaphoresis. Energy level is slowly returning.  HENT: Negative for headache, congestion, nasal discharge, hearing loss, sore throat, difficulty swallowing.   Eyes: Negative for blurred vision, double vision and discharge. Wears glasses.  Respiratory: Negative for cough, shortness of breath and wheezing.   Cardiovascular: Negative for chest pain, palpitations. Positive for leg swelling.  Gastrointestinal: Negative for heartburn, nausea, vomiting, abdominal pain. Last bowel movement was today.  Genitourinary: Negative for dysuria and flank pain.  Musculoskeletal: Negative for back pain, fall in the facility.  Skin: Negative for itching, rash.  Neurological: Negative for dizziness. Psychiatric/Behavioral: Negative for depression   Past Medical History  Diagnosis Date  . Coronary artery disease     Multivessel s/p CABG 1981 at [facility]  . Essential hypertension   .  Myocardial infarction ([place]) 1980  . Arthritis   . Hematuria   . Bladder infection, acute   . Peripheral vascular disease ([place])   . 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 TIA (transient ischemic attack)     Chronic coumadin  . Stroke ([place])   . Shortness of breath dyspnea   . Pneumonia   . Headache    Past Surgical History  Procedure Laterality Date  . Tonsillectomy  age 30    [facility]  . Foot surgery  1954    [facility], muscle implantation  . Coronary artery bypass graft  1981    [facility]  . Cystoscopy with injection  10/10/2010    Procedure: CYSTOSCOPY WITH INJECTION;  Surgeon: Marissa Nestle;  [facility];  Service: Urology;  Laterality: N/A;  with retrograde urethrogram  . Cardiac catheterization N/A 10/05/2014    Procedure: Left Heart Cath and Coronary Angiography;  Surgeon: Peter M Martinique, MD;  Location: [facility];  Service: Cardiovascular;  Laterality: N/A;  . Coronary artery bypass graft N/A 10/08/2014    Procedure: REDO CORONARY ARTERY BYPASS GRAFTING (CABG) Times Two Grafts using Left Internal Mammary and Left Radial Artery;  Surgeon: Melrose Nakayama, MD;  Location: [facility];  Service: Open Heart Surgery;  Laterality: N/A;  . Tee without cardioversion N/A 10/08/2014    Procedure: TRANSESOPHAGEAL ECHOCARDIOGRAM (TEE);  Surgeon: Melrose Nakayama, MD;  Location: [facility];  Service: Open Heart Surgery;  Laterality: N/A;  . Radial artery harvest Left 10/08/2014    Procedure: Left RADIAL ARTERY HARVEST;  Surgeon: Melrose Nakayama, MD;  Location: [facility];  Service: Open Heart Surgery;  Laterality: Left;  . Total knee arthroplasty Left 07/27/2015    Procedure: TOTAL LEFT KNEE ARTHROPLASTY;  Surgeon: [name], MD;  Location: [facility];  Service: Orthopedics;  Laterality: Left;   Social History:   reports that he quit smoking about 36 years ago. His smoking use included Cigarettes. He has a 28 pack-year smoking history. His smokeless tobacco  use includes Snuff. He reports that he does not drink alcohol or use illicit drugs.  Family History  Problem Relation Age of Onset  . Anesthesia problems Neg Hx   . Hypotension Neg Hx   . Malignant hyperthermia Neg Hx   . Pseudochol deficiency Neg Hx     Medications:   Medication List       This list is accurate as of: 08/03/15 12:51 PM.  Always use your most recent med list.               atenolol 50 MG tablet  Commonly known as:  TENORMIN  Take 1 tablet (50 mg total) by mouth daily.     atorvastatin 40 MG tablet  Commonly known as:  LIPITOR  Take 40 mg by mouth daily.     diazepam 2 MG tablet  Commonly known as:  VALIUM  Take 1 tablet (2 mg total) by mouth every 8 (eight) hours as needed for muscle spasms.     enoxaparin 30 MG/0.3ML injection  Commonly known as:  LOVENOX  Inject as directed q12 hours until coumadin levels are therapeutic     folic acid Q000111Q MCG tablet  Commonly known as:  FOLVITE  Take 800 mcg by mouth daily.     lisinopril 20 MG tablet  Commonly known as:  PRINIVIL,ZESTRIL  Take 1 tablet by mouth daily.     magnesium oxide 400 MG tablet  Commonly known as:  MAG-OX  Take 400 mg by mouth at bedtime.     MULTIVITAMIN PO  Take 1 tablet by mouth daily.     ondansetron 4 MG tablet  Commonly known as:  ZOFRAN  Take 1 tablet (4 mg total) by mouth every 8 (eight) hours as needed for nausea or vomiting.     OVER THE COUNTER MEDICATION  Take 1 tablet by mouth at bedtime.     oxyCODONE-acetaminophen 5-325 MG tablet  Commonly known as:  ROXICET  Take 1-2 tablets by mouth every 4 (four) hours as needed.     senna 8.6 MG tablet  Commonly known as:  SENOKOT  Take 1 tablet by mouth 2 (two) times daily.     simvastatin 80 MG tablet  Commonly known as:  ZOCOR  Take 1 tablet by mouth daily.     SUPER B-COMPLEX/VIT C/FA Tabs  Take 1 tablet by mouth daily.     vitamin C 500 MG tablet  Commonly known as:  ASCORBIC ACID  Take 500 mg by mouth daily.      warfarin 10 MG tablet  Commonly known as:  COUMADIN  Take 10 mg by mouth daily. OR AS DIRECTED        Immunizations: Immunization History  Administered Date(s) Administered  . PPD Test 08/01/2015     Physical Exam: Filed Vitals:   08/03/15 1201  BP: 142/77  Pulse: 90  Temp: 98.2 F (36.8 C)  TempSrc: Oral  Resp: 20  SpO2: 98%    General- elderly male, well built, in no acute distress Head-  normocephalic, atraumatic Nose-  no maxillary or frontal sinus tenderness, no nasal discharge Throat- moist mucus membrane Eyes- PERRLA, EOMI, no pallor, no icterus, no discharge, normal conjunctiva, normal sclera Neck- no cervical lymphadenopathy Cardiovascular- normal s1,s2, no murmur, 1+ pitting left leg edema Respiratory- bilateral clear to auscultation, no wheeze, no rhonchi, no crackles, no use of accessory muscles Abdomen- bowel sounds present, soft, non tender Musculoskeletal- able to move all 4 extremities, limited ROM with left knee Neurological- alert and oriented to person, place and time Skin- warm and dry, left knee surgical incision with aquacel dressing, stage 1 pressure ulcer to right heel and SDTI to left heel Psychiatry- normal mood and affect    Labs reviewed: Basic Metabolic Panel:  Recent Labs  10/08/14 2300 10/09/14 0412 10/09/14 1520  07/29/15 0527 07/30/15 0220 07/31/15 07/31/15 0315  NA  --  135  --   < > 138 137 137 137  K  --  3.9  --   < > 4.0 4.2  --  4.1  CL  --  104  --   < > 103 105  --  104  CO2  --  24  --   < > 27 22  --  25  GLUCOSE  --  113*  --   < > 127* 118*  --  106*  BUN  --  15  --   < > 19 23* 29* 29*  CREATININE 0.74 0.78 1.04  < > 0.99 0.96 1.0 1.05  CALCIUM  --  8.0*  --   < > 8.3* 8.4*  --  8.2*  MG 2.8* 2.2 2.3  --   --   --   --   --   < > = values in this interval not displayed. Liver Function Tests: No results for input(s): AST, ALT, ALKPHOS, BILITOT, PROT, ALBUMIN in the last 8760 hours. No results for  input(s): LIPASE, AMYLASE in the last 8760 hours. No results for input(s): AMMONIA in the last 8760 hours. CBC:  Recent Labs  07/29/15 0527 07/30/15 0220 07/31/15 07/31/15 0030 07/31/15 0315  WBC 6.4 8.3 8.2  --  8.2  HGB 7.7* 7.5*  --  8.6* 9.2*  HCT 24.3* 24.0*  --  26.7* 27.8*  MCV 93.1 93.8  --   --  91.1  PLT 100* 106*  --   --  121*   Cardiac Enzymes:  Recent Labs  10/01/14 1233 10/01/14 1531 10/01/14 2236  TROPONINI <0.03 <0.03 <0.03   BNP: Invalid input(s): POCBNP CBG:  Recent Labs  10/12/14 1616 10/12/14 2056 10/13/14 0611  GLUCAP 138* 117* 108*    Radiological Exams: Ct Head Wo Contrast  07/29/2015  CLINICAL DATA:  Blurred vision EXAM: CT HEAD WITHOUT CONTRAST TECHNIQUE: Contiguous axial images were obtained from the base of the skull through the vertex without intravenous contrast. COMPARISON:  None. FINDINGS: Brain: No intracranial hemorrhage, mass effect or midline shift. There is large area of encephalomalacia in right anterior temporal and right frontal lobe most likely from previous infarct measures at least 5 cm. Mild cerebral atrophy. Mild periventricular white matter decreased attenuation probable due to chronic small vessel ischemic changes. No definite acute cortical infarction. No mass lesion is noted on this unenhanced scan. Vascular: Atherosclerotic calcifications of carotid siphon. Skull: No skull fracture is noted. Sinuses/Orbits: There is probable mucous retention cyst medial aspect of the left maxillary sinus measures mild 9 mm. The mastoid air cells are unremarkable. Other: None IMPRESSION: No acute intracranial  abnormality. There is encephalomalacia in right temporal and right frontal lobe probable from prior infarct. Mild cerebral atrophy. Mild periventricular and patchy subcortical white matter decreased attenuation probable due to chronic small vessel ischemic changes. No definite acute cortical infarction. Electronically Signed   By: Lahoma Crocker  M.D.   On: 07/29/2015 09:56   Dg Knee Left Port  07/27/2015  CLINICAL DATA:  Status post left knee replacement EXAM: PORTABLE LEFT KNEE - 1-2 VIEW COMPARISON:  06/14/2014 FINDINGS: Left knee replacement is noted. Air is noted in surgical bed. No acute bony abnormality is seen. IMPRESSION: Status post left knee replacement. Electronically Signed   By: Inez Catalina M.D.   On: 07/27/2015 13:07    Assessment/Plan  Unsteady gait Post left knee surgery. Will have patient work with PT/OT as tolerated to regain strength and restore function.  Fall precautions are in place.  Left knee OA S/p left knee arthroplasty. Will have him work with physical therapy and occupational therapy team to help with gait training and muscle strengthening exercises.fall precautions. Skin care. Encourage to be out of bed. Has follow up with orthopedics. Continue roxicet 5-325 mg 1-2 tab q4h prn pain and valium 2 mg q8h prn muscle spasm. Continue lovenox with coumadin for dvt prophylaxis until inr is therapeutic  subtherapeutic inr inr today 1.8. Continue coumadin 10 mg daily with lovenox and check inr 08/04/15  Acute blood loss anemia Post op, monitor cbc, s/p 2 u prbc transfusion.   Left leg edema On dvt prophylaxis. Monitor clinically. Add ted hose. If worsens or has pain, consider ruling out DVT  afib Rate controlled. Continue atenolol and continue anticoagulation as above  HTN Monitor bp, continue atenolol 50 mg daily, lisinopril 20 mg daily, check bmp  HLD Continue statin  Heel pressure ulcer Air mattress to help prevent pressure ulcer and promote healing. Continue wound care  Constipation Continue senna s 1 tab bid and monitor, hydration encouraged   Goals of care: short term rehabilitation   Labs/tests ordered: cbc, cmp  Family/ staff Communication: reviewed care plan with patient and nursing supervisor    Blanchie Serve, MD Internal Medicine [place], [place] [address] Cell Phone (Monday-Friday 8 am - 5 pm): [phone] On Call: [phone] and follow prompts after 5 pm and on weekends Office Phone: [phone] Office Fax: [phone]

## 2015-08-04 LAB — POCT INR: INR: 1.6 — AB (ref <=1.1)

## 2015-08-05 ENCOUNTER — Non-Acute Institutional Stay: Payer: Commercial Managed Care - HMO | Admitting: Family

## 2015-08-05 ENCOUNTER — Encounter: Payer: Self-pay | Admitting: Family

## 2015-08-05 DIAGNOSIS — Z96652 Presence of left artificial knee joint: Secondary | ICD-10-CM

## 2015-08-05 DIAGNOSIS — D689 Coagulation defect, unspecified: Secondary | ICD-10-CM | POA: Diagnosis not present

## 2015-08-05 NOTE — Progress Notes (Signed)
Location:  [facility] Room Number: [id number] Place of Service:  SNF [phone]) Provider:  Marlowe Sax, NP Blanchie Serve, MD   Wende Neighbors, MD  Patient Care Team: Celene Squibb, MD as PCP - General (Internal Medicine)  Extended Emergency Contact Information Primary Emergency Contact: Monks,Glenda Address: [address]          [place], [place] [address] [place] Phone: [phone] Relation: Spouse  Code Status:  Full Code  Goals of care: Advanced Directive information Advanced Directives 07/27/2015  Does patient have an advance directive? No  Would patient like information on creating an advanced directive? No - patient declined information     Chief Complaint  Patient presents with  . Acute Visit    Acute Concerns    HPI:  Pt is a 81 y.o. male seen today at [facility]  for an acute visit for evaluation of abnormal lab results. He is s/p left Total knee replacement. He is currently on Lovenox 30 mg /ml injection and coumadin 10 mg Tablet daily. He is to continue Lovenox until his INR is therapeutic. His Three consecutive INR have been subtherapeutic  INR 1.8 (08/03/2015), 1.6 ( 08/04/2015) and 1.5 ( 08/05/2015). Facility Nurse supervisor counted coumadin tablets and Lovenox states both medication being administered. Patient denies any left leg worsening swelling or pain. Patient's weight 242 lbs possible need increased in Lovenox dose. Dr. Bubba Camp consulted recommended 30 % increase in Lovenox since current Lovenox is DVT prophylaxis and not for treatment of DVT.    Past Medical History  Diagnosis Date  . Coronary artery disease     Multivessel s/p CABG [facility] at [facility]  . Essential hypertension   . Myocardial infarction ([place]) 1980  . Arthritis   . Hematuria   . Bladder infection, acute   . Peripheral vascular disease ([place])   . 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 TIA (transient ischemic attack)      Chronic coumadin  . Stroke ([place])   . Shortness of breath dyspnea   . Pneumonia   . Headache    Past Surgical History  Procedure Laterality Date  . Tonsillectomy  age 15    [facility]  . Foot surgery  1954    [facility], muscle implantation  . Coronary artery bypass graft  1981    [facility]  . Cystoscopy with injection  10/10/2010    Procedure: CYSTOSCOPY WITH INJECTION;  Surgeon: Marissa Nestle;  [facility];  Service: Urology;  Laterality: N/A;  with retrograde urethrogram  . Cardiac catheterization N/A 10/05/2014    Procedure: Left Heart Cath and Coronary Angiography;  Surgeon: Peter M Martinique, MD;  Location: [facility] LAB;  Service: Cardiovascular;  Laterality: N/A;  . Coronary artery bypass graft N/A 10/08/2014    Procedure: REDO CORONARY ARTERY BYPASS GRAFTING (CABG) Times Two Grafts using Left Internal Mammary and Left Radial Artery;  Surgeon: Melrose Nakayama, MD;  Location: [facility];  Service: Open Heart Surgery;  Laterality: N/A;  . Tee without cardioversion N/A 10/08/2014    Procedure: TRANSESOPHAGEAL ECHOCARDIOGRAM (TEE);  Surgeon: Melrose Nakayama, MD;  Location: [place] [facility];  Service: Open Heart Surgery;  Laterality: N/A;  . Radial artery harvest Left 10/08/2014    Procedure: Left RADIAL ARTERY HARVEST;  Surgeon: Melrose Nakayama, MD;  Location: [facility];  Service: Open Heart Surgery;  Laterality: Left;  .  Total knee arthroplasty Left 07/27/2015    Procedure: TOTAL LEFT KNEE ARTHROPLASTY;  Surgeon: [name], MD;  Location: [facility];  Service: Orthopedics;  Laterality: Left;    No Known Allergies    Medication List       This list is accurate as of: 08/05/15  3:19 PM.  Always use your most recent med list.               atenolol 50 MG tablet  Commonly known as:  TENORMIN  Take 1 tablet (50 mg total) by mouth daily.     atorvastatin 40 MG tablet  Commonly known as:  LIPITOR  Take 40 mg by mouth daily.     CERTAVITE/ANTIOXIDANTS Tabs  Take 1 tablet by mouth  daily at 12 noon.     diazepam 2 MG tablet  Commonly known as:  VALIUM  Take 1 tablet (2 mg total) by mouth every 8 (eight) hours as needed for muscle spasms.     enoxaparin 30 MG/0.3ML injection  Commonly known as:  LOVENOX  Inject as directed q12 hours until coumadin levels are therapeutic     folic acid Q000111Q MCG tablet  Commonly known as:  FOLVITE  Take 800 mcg by mouth daily.     lisinopril 20 MG tablet  Commonly known as:  PRINIVIL,ZESTRIL  Take 1 tablet by mouth daily.     magnesium oxide 400 MG tablet  Commonly known as:  MAG-OX  Take 400 mg by mouth at bedtime.     ondansetron 4 MG tablet  Commonly known as:  ZOFRAN  Take 4 mg by mouth every 4 (four) hours as needed for nausea or vomiting.     oxyCODONE-acetaminophen 5-325 MG tablet  Commonly known as:  ROXICET  Take 1-2 tablets by mouth every 4 (four) hours as needed.     senna 8.6 MG tablet  Commonly known as:  SENOKOT  Take 1 tablet by mouth 2 (two) times daily.     SUPER B-COMPLEX/VIT C/FA Tabs  Take 1 tablet by mouth daily.     vitamin C 500 MG tablet  Commonly known as:  ASCORBIC ACID  Take 500 mg by mouth daily.     warfarin 10 MG tablet  Commonly known as:  COUMADIN  Take 10 mg by mouth daily. OR AS DIRECTED     zinc oxide 11.3 % Crea cream  Commonly known as:  BALMEX  Apply ointment to buttocks every shift        Review of Systems  Constitutional: Negative for fever, chills, activity change, appetite change and fatigue.  Respiratory: Negative for cough, chest tightness, shortness of breath and wheezing.   Cardiovascular: Negative for chest pain.  Musculoskeletal: Positive for gait problem.  Skin: Negative for color change and rash.       Left Leg old bruise.   Hematological: Does not bruise/bleed easily.  Psychiatric/Behavioral: Negative for hallucinations, confusion and agitation. The patient is not nervous/anxious.     Immunization History  Administered Date(s) Administered  . PPD  Test 08/01/2015   Pertinent  Health Maintenance Due  Topic Date Due  . FOOT EXAM  02/09/1945  . OPHTHALMOLOGY EXAM  02/09/1945  . PNA vac Low Risk Adult (1 of 2 - PCV13) 02/08/2002  . HEMOGLOBIN A1C  04/15/2015  . INFLUENZA VACCINE  11/01/2015   No flowsheet data found. Functional Status Survey:    Filed Vitals:   08/05/15 1458  BP: 122/69  Pulse: 81  Temp: 98.7  F (37.1 C)  Resp: 20  Height: 5\' 11"  (1.803 m)  Weight: 242 lb (109.77 kg)  SpO2: 96%   Body mass index is 33.77 kg/(m^2). Physical Exam  Constitutional: He appears well-developed and well-nourished. No distress.  HENT:  Head: Normocephalic.  Mouth/Throat: Oropharynx is clear and moist.  Eyes: Conjunctivae and EOM are normal. Pupils are equal, round, and reactive to light. Right eye exhibits no discharge. Left eye exhibits no discharge. No scleral icterus.  Neck: Normal range of motion. No JVD present.  Cardiovascular: Normal rate, normal heart sounds and intact distal pulses.  Exam reveals no gallop and no friction rub.   No murmur heard. Irregular Heart rate   Pulmonary/Chest: Effort normal and breath sounds normal. No respiratory distress. He has no wheezes. He has no rales.  Abdominal: Soft. Bowel sounds are normal. He exhibits no distension. There is no tenderness. There is no rebound and no guarding.  Musculoskeletal: He exhibits no tenderness.  Limited ROM left knee surgical incision  Lymphadenopathy:    He has no cervical adenopathy.  Neurological: He is alert.  Skin: Skin is warm and dry. No rash noted. No erythema. No pallor.  Left knee surgical drsg intact. Surrounding skin without any signs of redness or drainage. Left leg calf with old purple bruise still the same size per patient. Non-tender or not warm to touch   Psychiatric: He has a normal mood and affect.    Labs reviewed:  Recent Labs  10/08/14 2300 10/09/14 0412 10/09/14 1520  07/29/15 0527 07/30/15 0220 07/31/15 07/31/15 0315  NA   --  135  --   < > 138 137 137 137  K  --  3.9  --   < > 4.0 4.2  --  4.1  CL  --  104  --   < > 103 105  --  104  CO2  --  24  --   < > 27 22  --  25  GLUCOSE  --  113*  --   < > 127* 118*  --  106*  BUN  --  15  --   < > 19 23* 29* 29*  CREATININE 0.74 0.78 1.04  < > 0.99 0.96 1.0 1.05  CALCIUM  --  8.0*  --   < > 8.3* 8.4*  --  8.2*  MG 2.8* 2.2 2.3  --   --   --   --   --   < > = values in this interval not displayed. No results for input(s): AST, ALT, ALKPHOS, BILITOT, PROT, ALBUMIN in the last 8760 hours.  Recent Labs  07/29/15 0527 07/30/15 0220 07/31/15 07/31/15 0030 07/31/15 0315  WBC 6.4 8.3 8.2  --  8.2  HGB 7.7* 7.5*  --  8.6* 9.2*  HCT 24.3* 24.0*  --  26.7* 27.8*  MCV 93.1 93.8  --   --  91.1  PLT 100* 106*  --   --  121*   Lab Results  Component Value Date   TSH 1.668 10/01/2014   Lab Results  Component Value Date   HGBA1C 5.4 10/13/2014   Lab Results  Component Value Date   CHOL 118* 03/05/2015   HDL 39* 03/05/2015   LDLCALC 58 03/05/2015   TRIG 104 03/05/2015   CHOLHDL 3.0 03/05/2015    Significant Diagnostic Results in last 30 days:  Ct Head Wo Contrast  07/29/2015  CLINICAL DATA:  Blurred vision EXAM: CT HEAD WITHOUT CONTRAST TECHNIQUE: Contiguous axial  images were obtained from the base of the skull through the vertex without intravenous contrast. COMPARISON:  None. FINDINGS: Brain: No intracranial hemorrhage, mass effect or midline shift. There is large area of encephalomalacia in right anterior temporal and right frontal lobe most likely from previous infarct measures at least 5 cm. Mild cerebral atrophy. Mild periventricular white matter decreased attenuation probable due to chronic small vessel ischemic changes. No definite acute cortical infarction. No mass lesion is noted on this unenhanced scan. Vascular: Atherosclerotic calcifications of carotid siphon. Skull: No skull fracture is noted. Sinuses/Orbits: There is probable mucous retention cyst  medial aspect of the left maxillary sinus measures mild 9 mm. The mastoid air cells are unremarkable. Other: None IMPRESSION: No acute intracranial abnormality. There is encephalomalacia in right temporal and right frontal lobe probable from prior infarct. Mild cerebral atrophy. Mild periventricular and patchy subcortical white matter decreased attenuation probable due to chronic small vessel ischemic changes. No definite acute cortical infarction. Electronically Signed   By: Lahoma Crocker M.D.   On: 07/29/2015 09:56   Dg Knee Left Port  07/27/2015  CLINICAL DATA:  Status post left knee replacement EXAM: PORTABLE LEFT KNEE - 1-2 VIEW COMPARISON:  06/14/2014 FINDINGS: Left knee replacement is noted. Air is noted in surgical bed. No acute bony abnormality is seen. IMPRESSION: Status post left knee replacement. Electronically Signed   By: Inez Catalina M.D.   On: 07/27/2015 13:07    Assessment/Plan Coagulopathy  Three consecutive INR have been subtherapeutic  INR 1.8 (08/03/2015), 1.6 ( 08/04/2015) and 1.5 ( 08/05/2015). Facility Nurse supervisor counted coumadin tablets and Lovenox states both medication being administered. Patient denies any left leg worsening swelling or pain. Patient's weight 242 lbs possible need increased in Lovenox dose. Dr. Bubba Camp consulted recommended 30 % increase in Lovenox since current Lovenox is DVT prophylaxis and not for treatment of DVT.  Continue Coumadin 10 mg Tablet. Increase Lovenox to 40 mg/ ml injection. Monitor INR daily on Sat and Sun and Notify provider.   S/p Total Knee Replacement  Left knee Drsg intact. Pain under control with current regimen. Continue with PT/OT, continue to follow up with Orthopedic.    Family/ staff Communication: Reviewed plan of care with Dr. Bubba Camp, Patient and facility Nurse supervisor.   Labs/tests ordered:  INR daily on 08/06/2015 and 08/07/2015 notify on call provider.

## 2015-08-08 ENCOUNTER — Non-Acute Institutional Stay (SKILLED_NURSING_FACILITY): Payer: Commercial Managed Care - HMO | Admitting: Family

## 2015-08-08 ENCOUNTER — Encounter: Payer: Self-pay | Admitting: Family

## 2015-08-08 DIAGNOSIS — E785 Hyperlipidemia, unspecified: Secondary | ICD-10-CM | POA: Diagnosis not present

## 2015-08-08 DIAGNOSIS — I480 Paroxysmal atrial fibrillation: Secondary | ICD-10-CM | POA: Diagnosis not present

## 2015-08-08 DIAGNOSIS — I2510 Atherosclerotic heart disease of native coronary artery without angina pectoris: Secondary | ICD-10-CM | POA: Diagnosis not present

## 2015-08-08 DIAGNOSIS — K5900 Constipation, unspecified: Secondary | ICD-10-CM | POA: Insufficient documentation

## 2015-08-08 DIAGNOSIS — Z96652 Presence of left artificial knee joint: Secondary | ICD-10-CM

## 2015-08-08 DIAGNOSIS — K5901 Slow transit constipation: Secondary | ICD-10-CM

## 2015-08-08 DIAGNOSIS — I10 Essential (primary) hypertension: Secondary | ICD-10-CM

## 2015-08-08 DIAGNOSIS — R269 Unspecified abnormalities of gait and mobility: Secondary | ICD-10-CM | POA: Diagnosis not present

## 2015-08-08 DIAGNOSIS — R791 Abnormal coagulation profile: Secondary | ICD-10-CM | POA: Insufficient documentation

## 2015-08-08 NOTE — Progress Notes (Signed)
Location:  [place] Room Number: [id number] Place of Service:  SNF [phone])  Provider: Marlowe Sax, NP Blanchie Serve, MD   PCP: Wende Neighbors, MD Patient Care Team: Celene Squibb, MD as PCP - General (Internal Medicine)  Extended Emergency Contact Information Primary Emergency Contact: Mcroy,Glenda Address: [address]          [place], [place] [address] [place] Phone: [phone] Relation: Spouse  Code Status: Full Code  Goals of care:  Advanced Directive information Advanced Directives 07/27/2015  Does patient have an advance directive? No  Would patient like information on creating an advanced directive? No - patient declined information     No Known Allergies  Chief Complaint  Patient presents with  . Discharge Note    Discharged from SNF    HPI:  81 y.o. male seen today at [facility] for discharge home. He was here for short term rehabilitation post hospital admission from 07/27/15-08/01/15 with left knee OA. He underwent left total knee arthroplasty. He had 2 units of  PRBC transfusion for ABLA.He has a medical history of HTN, CAD, PVD, Stroke, TIA, MI, Arthritis, Polio among others. He is seen in his room today with his brother in law at the bedside. He complains of left knee pain just returned from Physical therapy. He states left knee pain well controlled with current pain regimen. He has worked well PT/OT now stable for discharge home. He will be discharge with PT to continue with ROM, Exercise, Gait stability and muscle strengthening. He will require a [facility] Nurse for wound management and INR check. He is currently on coumadin 10 mg Tablet daily for Afib and Lovenox 40 mg inject for DVT prophylaxis until INR levels are therapeutic. Lovenox increased to 40 mg SQ 08/05/15 due to subtherapeutic INR for three consecutive days. Recent INR 2.1 ( 08/07/2015) due next 08/10/2015. He does not require any DME states has own walker.  Facility social worker will arrange for Home health services prior to discharge home.    Past Medical History  Diagnosis Date  . Coronary artery disease     Multivessel s/p CABG [facility] at [facility]  . Essential hypertension   . Myocardial infarction ([place]) 1980  . Arthritis   . Hematuria   . Bladder infection, acute   . Peripheral vascular disease ([place])   . Polio osteopathy of lower leg ([place]) Age 73    Affected right leg  . TIA (transient ischemic attack)     Chronic coumadin  . Stroke ([place])   . Shortness of breath dyspnea   . Pneumonia   . Headache     Past Surgical History  Procedure Laterality Date  . Tonsillectomy  age 66    [facility]  . Foot surgery  1954    [facility], muscle implantation  . Coronary artery bypass graft  1981    [facility]  . Cystoscopy with injection  10/10/2010    Procedure: CYSTOSCOPY WITH INJECTION;  Surgeon: Marissa Nestle;  Location: [facility];  Service: Urology;  Laterality: N/A;  with retrograde urethrogram  . Cardiac catheterization N/A 10/05/2014    Procedure: Left Heart Cath and Coronary Angiography;  Surgeon: Peter M Martinique, MD;  Location: [place] CV LAB;  Service: Cardiovascular;  Laterality: N/A;  . Coronary artery bypass graft N/A 10/08/2014    Procedure: REDO CORONARY ARTERY BYPASS GRAFTING (CABG) Times Two Grafts using Left Internal Mammary and Left Radial Artery;  Surgeon: Melrose Nakayama, MD;  Location: [place];  Service: Open Heart Surgery;  Laterality: N/A;  . Tee without cardioversion N/A 10/08/2014    Procedure: TRANSESOPHAGEAL ECHOCARDIOGRAM (TEE);  Surgeon: Melrose Nakayama, MD;  Location: [place];  Service: Open Heart Surgery;  Laterality: N/A;  . Radial artery harvest Left 10/08/2014    Procedure: Left RADIAL ARTERY HARVEST;  Surgeon: Melrose Nakayama, MD;  Location: [place];  Service: Open Heart Surgery;  Laterality: Left;  . Total knee arthroplasty Left 07/27/2015    Procedure: TOTAL LEFT KNEE ARTHROPLASTY;  Surgeon: [name], MD;   Location: [place];  Service: Orthopedics;  Laterality: Left;      reports that he quit smoking about 36 years ago. His smoking use included Cigarettes. He has a 28 pack-year smoking history. His smokeless tobacco use includes Snuff. He reports that he does not drink alcohol or use illicit drugs. Social History   Social History  . Marital Status: Married    Spouse Name: N/A  . Number of Children: N/A  . Years of Education: N/A   Occupational History  . Not on file.   Social History Main Topics  . Smoking status: Former Smoker -- 1.00 packs/day for 28 years    Types: Cigarettes    Quit date: 12/02/1978  . Smokeless tobacco: Current User    Types: Snuff  . Alcohol Use: No  . Drug Use: No  . Sexual Activity: Not on file   Other Topics Concern  . Not on file   Social History Narrative   Functional Status Survey:    No Known Allergies  Pertinent  Health Maintenance Due  Topic Date Due  . FOOT EXAM  02/09/1947  . OPHTHALMOLOGY EXAM  02/09/1947  . PNA vac Low Risk Adult (1 of 2 - PCV13) 02/08/2002  . HEMOGLOBIN A1C  04/15/2015  . INFLUENZA VACCINE  11/01/2015    Medications:   Medication List       This list is accurate as of: 08/08/15 11:29 AM.  Always use your most recent med list.               atenolol 50 MG tablet  Commonly known as:  TENORMIN  Take 1 tablet (50 mg total) by mouth daily.     atorvastatin 40 MG tablet  Commonly known as:  LIPITOR  Take 40 mg by mouth daily.     CERTAVITE/ANTIOXIDANTS Tabs  Take 1 tablet by mouth daily.     diazepam 2 MG tablet  Commonly known as:  VALIUM  Take 1 tablet (2 mg total) by mouth every 8 (eight) hours as needed for muscle spasms.     folic acid Q000111Q MCG tablet  Commonly known as:  FOLVITE  Take 800 mcg by mouth daily.     lisinopril 20 MG tablet  Commonly known as:  PRINIVIL,ZESTRIL  Take 1 tablet by mouth daily.     LOVENOX 40 MG/0.4ML injection  Generic drug:  enoxaparin  Inject 40 mg into the skin  every 12 (twelve) hours.     magnesium oxide 400 MG tablet  Commonly known as:  MAG-OX  Take 400 mg by mouth at bedtime.     ondansetron 4 MG tablet  Commonly known as:  ZOFRAN  Take 4 mg by mouth every 4 (four) hours as needed for nausea or vomiting.     oxyCODONE-acetaminophen 5-325 MG tablet  Commonly known as:  ROXICET  Take 1-2 tablets by mouth every 4 (  four) hours as needed.     senna 8.6 MG tablet  Commonly known as:  SENOKOT  Take 1 tablet by mouth 2 (two) times daily.     SUPER B-COMPLEX/VIT C/FA Tabs  Take 1 tablet by mouth daily.     vitamin C 500 MG tablet  Commonly known as:  ASCORBIC ACID  Take 500 mg by mouth daily.     warfarin 10 MG tablet  Commonly known as:  COUMADIN  Take 10 mg by mouth daily. OR AS DIRECTED     zinc oxide 11.3 % Crea cream  Commonly known as:  BALMEX  Apply ointment to buttocks every shift        Review of Systems  Constitutional: Negative for fever, chills, activity change, appetite change and fatigue.  HENT: Negative for congestion, hearing loss, rhinorrhea, sinus pressure, sneezing and sore throat.   Respiratory: Negative for cough, chest tightness, shortness of breath and wheezing.   Cardiovascular: Negative for chest pain and palpitations.  Gastrointestinal: Negative for nausea, vomiting, abdominal pain, diarrhea, constipation and abdominal distention.  Endocrine: Negative.   Genitourinary: Negative for dysuria, urgency, frequency and flank pain.  Musculoskeletal: Positive for gait problem.  Skin: Negative for color change and rash.       Left Leg old bruise.   Neurological: Negative for dizziness, seizures, light-headedness and headaches.  Hematological: Does not bruise/bleed easily.  Psychiatric/Behavioral: Negative for hallucinations, confusion, sleep disturbance and agitation. The patient is not nervous/anxious.     Filed Vitals:   08/08/15 1119  BP: 109/60  Pulse: 89  Temp: 99.6 F (37.6 C)  Resp: 20  Height:  5\' 11"  (1.803 m)  Weight: 242 lb (109.77 kg)  SpO2: 95%   Body mass index is 33.77 kg/(m^2). Physical Exam  Constitutional: He is oriented to person, place, and time. He appears well-developed and well-nourished. No distress.  HENT:  Head: Normocephalic.  Mouth/Throat: Oropharynx is clear and moist.  Eyes: Conjunctivae and EOM are normal. Pupils are equal, round, and reactive to light. Right eye exhibits no discharge. Left eye exhibits no discharge. No scleral icterus.  Neck: Normal range of motion. No JVD present. No thyromegaly present.  Cardiovascular: Normal rate, regular rhythm, normal heart sounds and intact distal pulses.  Exam reveals no friction rub.   No murmur heard. Pulmonary/Chest: Effort normal and breath sounds normal. No respiratory distress. He has no wheezes. He has no rales.  Abdominal: Soft. Bowel sounds are normal. He exhibits no distension and no mass. There is no tenderness. There is no rebound and no guarding.  Musculoskeletal: He exhibits no tenderness.  Bilateral LE's edema Ted hose in place. Left Knee Limited ROM due to pain.   Lymphadenopathy:    He has no cervical adenopathy.  Neurological: He is oriented to person, place, and time.  Skin: Skin is warm and dry. No rash noted. No erythema. No pallor.  Left knee surgical drsg dry, clean and intact.   Psychiatric: He has a normal mood and affect.    Labs reviewed: Basic Metabolic Panel:  Recent Labs  10/08/14 2300 10/09/14 0412 10/09/14 1520  07/29/15 0527 07/30/15 0220 07/31/15 07/31/15 0315  NA  --  135  --   < > 138 137 137 137  K  --  3.9  --   < > 4.0 4.2  --  4.1  CL  --  104  --   < > 103 105  --  104  CO2  --  24  --   < >  27 22  --  25  GLUCOSE  --  113*  --   < > 127* 118*  --  106*  BUN  --  15  --   < > 19 23* 29* 29*  CREATININE 0.74 0.78 1.04  < > 0.99 0.96 1.0 1.05  CALCIUM  --  8.0*  --   < > 8.3* 8.4*  --  8.2*  MG 2.8* 2.2 2.3  --   --   --   --   --   < > = values in this  interval not displayed. Liver Function Tests: No results for input(s): AST, ALT, ALKPHOS, BILITOT, PROT, ALBUMIN in the last 8760 hours. No results for input(s): LIPASE, AMYLASE in the last 8760 hours. No results for input(s): AMMONIA in the last 8760 hours. CBC:  Recent Labs  07/29/15 0527 07/30/15 0220 07/31/15 07/31/15 0030 07/31/15 0315  WBC 6.4 8.3 8.2  --  8.2  HGB 7.7* 7.5*  --  8.6* 9.2*  HCT 24.3* 24.0*  --  26.7* 27.8*  MCV 93.1 93.8  --   --  91.1  PLT 100* 106*  --   --  121*   Cardiac Enzymes:  Recent Labs  10/01/14 1233 10/01/14 1531 10/01/14 2236  TROPONINI <0.03 <0.03 <0.03   BNP: Invalid input(s): POCBNP CBG:  Recent Labs  10/12/14 1616 10/12/14 2056 10/13/14 0611  GLUCAP 138* 117* 108*    Procedures and Imaging Studies During Stay: Ct Head Wo Contrast  07/29/2015  CLINICAL DATA:  Blurred vision EXAM: CT HEAD WITHOUT CONTRAST TECHNIQUE: Contiguous axial images were obtained from the base of the skull through the vertex without intravenous contrast. COMPARISON:  None. FINDINGS: Brain: No intracranial hemorrhage, mass effect or midline shift. There is large area of encephalomalacia in right anterior temporal and right frontal lobe most likely from previous infarct measures at least 5 cm. Mild cerebral atrophy. Mild periventricular white matter decreased attenuation probable due to chronic small vessel ischemic changes. No definite acute cortical infarction. No mass lesion is noted on this unenhanced scan. Vascular: Atherosclerotic calcifications of carotid siphon. Skull: No skull fracture is noted. Sinuses/Orbits: There is probable mucous retention cyst medial aspect of the left maxillary sinus measures mild 9 mm. The mastoid air cells are unremarkable. Other: None IMPRESSION: No acute intracranial abnormality. There is encephalomalacia in right temporal and right frontal lobe probable from prior infarct. Mild cerebral atrophy. Mild periventricular and patchy  subcortical white matter decreased attenuation probable due to chronic small vessel ischemic changes. No definite acute cortical infarction. Electronically Signed   By: Lahoma Crocker M.D.   On: 07/29/2015 09:56   Dg Knee Left Port  07/27/2015  CLINICAL DATA:  Status post left knee replacement EXAM: PORTABLE LEFT KNEE - 1-2 VIEW COMPARISON:  06/14/2014 FINDINGS: Left knee replacement is noted. Air is noted in surgical bed. No acute bony abnormality is seen. IMPRESSION: Status post left knee replacement. Electronically Signed   By: Inez Catalina M.D.   On: 07/27/2015 13:07    Assessment/Plan:   S/p Total Knee Replacement   Post short term rehabilitation post hospital admission from 07/27/15-08/01/15 with left knee OA. He underwent left total knee arthroplasty. Has worked well with PT/OT will be discharge with PT to continue with ROM, Exercise, Gait stability and muscle strengthening. He will require a [facility] Nurse for wound management and INR check. He is currently on coumadin 10 mg Tablet daily for Afib and Lovenox 40 mg inject for DVT prophylaxis  until INR levels are therapeutic. Lovenox increased to 40 mg SQ 08/05/15 due to subtherapeutic INR for three consecutive days. Recent INR 2.1 ( 08/07/2015) due next 08/10/2015. Follow up with Orthopedic as scheduled.   Afib Continue  Atenolol 50 mg, coumadin 10 mg Tablet daily and Lovenox 40 mg SQ until INR is therapeutic.Patient self Lovenox self administration to be given by facility Nurse prior to discharge home. INR due 08/10/2015. [facility] Nurse to recheck INR.   HTN B/p stable. Continue on Lisinopril 20 mg Tablet and Atenolol 50 mg tablet. BMP in 1-2 weeks with PCP   CAD Chest pain free. Continue to control high risk factors.   Suptherapeutic INR Had three consecutive INR subtherapeutic INR 1.8 (08/03/2015), 1.6 ( 08/04/2015) and 1.5 ( 08/05/2015).Lovenox increased to 40 mg SQ 08/05/15 due to subtherapeutic INR for three consecutive days. Recent INR 2.1 ( 08/07/2015) due next  08/10/2015. Continue  on coumadin 10 mg Tablet daily and Lovenox 40 mg inject for DVT prophylaxis until INR levels are therapeutic.   Constipation Current regimen effective. Continue to monitor.   Hyperlipidemia Continue Atorvastatin 40 mg Tablet. Lipid panel with PCP   Abnormal Gait  S/p Total Knee Replacement has worked well with PT/OT.will discharge home with PT to continue with ROM, Exercise, Gait stability and muscle strengthening. Fall and safety precautions.   Patient is being discharged with the following home health services:     PT to continue with ROM, Exercise, Gait stability and muscle strengthening.    [facility] Nurse for wound management and INR check. Recent INR 2.1 ( 08/07/2015) due next 08/10/2015.  Facility social worker will arrange for Home health services prior to discharge home.   Patient is being discharged with the following durable medical equipment:   He does not require any DME states has own walker.  Rx written x 1 month supply   Patient has been advised to f/u with their PCP in 1-2 weeks to bring them up to date on their rehab stay.  Social services at facility was responsible for arranging this appointment.  Pt was provided with a 30 day supply of prescriptions for medications and refills must be obtained from their PCP.  For controlled substances, a more limited supply may be provided adequate until PCP appointment only.  Future labs/tests needed:  Check INR 08/10/2015 CBC, BMP in 1-2 weeks with PCP

## 2015-08-09 DIAGNOSIS — M1712 Unilateral primary osteoarthritis, left knee: Secondary | ICD-10-CM | POA: Diagnosis not present

## 2015-08-12 DIAGNOSIS — Z471 Aftercare following joint replacement surgery: Secondary | ICD-10-CM | POA: Diagnosis not present

## 2015-08-12 DIAGNOSIS — I480 Paroxysmal atrial fibrillation: Secondary | ICD-10-CM | POA: Diagnosis not present

## 2015-08-12 DIAGNOSIS — M89661 Osteopathy after poliomyelitis, right lower leg: Secondary | ICD-10-CM | POA: Diagnosis not present

## 2015-08-12 DIAGNOSIS — I739 Peripheral vascular disease, unspecified: Secondary | ICD-10-CM | POA: Diagnosis not present

## 2015-08-12 DIAGNOSIS — I25110 Atherosclerotic heart disease of native coronary artery with unstable angina pectoris: Secondary | ICD-10-CM | POA: Diagnosis not present

## 2015-08-12 DIAGNOSIS — M1990 Unspecified osteoarthritis, unspecified site: Secondary | ICD-10-CM | POA: Diagnosis not present

## 2015-08-15 DIAGNOSIS — I480 Paroxysmal atrial fibrillation: Secondary | ICD-10-CM | POA: Diagnosis not present

## 2015-08-15 DIAGNOSIS — Z471 Aftercare following joint replacement surgery: Secondary | ICD-10-CM | POA: Diagnosis not present

## 2015-08-15 DIAGNOSIS — I739 Peripheral vascular disease, unspecified: Secondary | ICD-10-CM | POA: Diagnosis not present

## 2015-08-15 DIAGNOSIS — M1990 Unspecified osteoarthritis, unspecified site: Secondary | ICD-10-CM | POA: Diagnosis not present

## 2015-08-15 DIAGNOSIS — M89661 Osteopathy after poliomyelitis, right lower leg: Secondary | ICD-10-CM | POA: Diagnosis not present

## 2015-08-15 DIAGNOSIS — I25110 Atherosclerotic heart disease of native coronary artery with unstable angina pectoris: Secondary | ICD-10-CM | POA: Diagnosis not present

## 2015-08-17 DIAGNOSIS — M89661 Osteopathy after poliomyelitis, right lower leg: Secondary | ICD-10-CM | POA: Diagnosis not present

## 2015-08-17 DIAGNOSIS — I739 Peripheral vascular disease, unspecified: Secondary | ICD-10-CM | POA: Diagnosis not present

## 2015-08-17 DIAGNOSIS — I25110 Atherosclerotic heart disease of native coronary artery with unstable angina pectoris: Secondary | ICD-10-CM | POA: Diagnosis not present

## 2015-08-17 DIAGNOSIS — M1990 Unspecified osteoarthritis, unspecified site: Secondary | ICD-10-CM | POA: Diagnosis not present

## 2015-08-17 DIAGNOSIS — Z471 Aftercare following joint replacement surgery: Secondary | ICD-10-CM | POA: Diagnosis not present

## 2015-08-17 DIAGNOSIS — I480 Paroxysmal atrial fibrillation: Secondary | ICD-10-CM | POA: Diagnosis not present

## 2015-08-18 DIAGNOSIS — M89661 Osteopathy after poliomyelitis, right lower leg: Secondary | ICD-10-CM | POA: Diagnosis not present

## 2015-08-18 DIAGNOSIS — Z471 Aftercare following joint replacement surgery: Secondary | ICD-10-CM | POA: Diagnosis not present

## 2015-08-18 DIAGNOSIS — M1990 Unspecified osteoarthritis, unspecified site: Secondary | ICD-10-CM | POA: Diagnosis not present

## 2015-08-18 DIAGNOSIS — I739 Peripheral vascular disease, unspecified: Secondary | ICD-10-CM | POA: Diagnosis not present

## 2015-08-18 DIAGNOSIS — I480 Paroxysmal atrial fibrillation: Secondary | ICD-10-CM | POA: Diagnosis not present

## 2015-08-18 DIAGNOSIS — I25110 Atherosclerotic heart disease of native coronary artery with unstable angina pectoris: Secondary | ICD-10-CM | POA: Diagnosis not present

## 2015-08-19 DIAGNOSIS — I480 Paroxysmal atrial fibrillation: Secondary | ICD-10-CM | POA: Diagnosis not present

## 2015-08-19 DIAGNOSIS — M89661 Osteopathy after poliomyelitis, right lower leg: Secondary | ICD-10-CM | POA: Diagnosis not present

## 2015-08-19 DIAGNOSIS — Z471 Aftercare following joint replacement surgery: Secondary | ICD-10-CM | POA: Diagnosis not present

## 2015-08-19 DIAGNOSIS — M1990 Unspecified osteoarthritis, unspecified site: Secondary | ICD-10-CM | POA: Diagnosis not present

## 2015-08-19 DIAGNOSIS — I25110 Atherosclerotic heart disease of native coronary artery with unstable angina pectoris: Secondary | ICD-10-CM | POA: Diagnosis not present

## 2015-08-19 DIAGNOSIS — I739 Peripheral vascular disease, unspecified: Secondary | ICD-10-CM | POA: Diagnosis not present

## 2015-08-22 DIAGNOSIS — Z471 Aftercare following joint replacement surgery: Secondary | ICD-10-CM | POA: Diagnosis not present

## 2015-08-22 DIAGNOSIS — I480 Paroxysmal atrial fibrillation: Secondary | ICD-10-CM | POA: Diagnosis not present

## 2015-08-22 DIAGNOSIS — M1990 Unspecified osteoarthritis, unspecified site: Secondary | ICD-10-CM | POA: Diagnosis not present

## 2015-08-22 DIAGNOSIS — M89661 Osteopathy after poliomyelitis, right lower leg: Secondary | ICD-10-CM | POA: Diagnosis not present

## 2015-08-22 DIAGNOSIS — I25110 Atherosclerotic heart disease of native coronary artery with unstable angina pectoris: Secondary | ICD-10-CM | POA: Diagnosis not present

## 2015-08-22 DIAGNOSIS — I739 Peripheral vascular disease, unspecified: Secondary | ICD-10-CM | POA: Diagnosis not present

## 2015-08-23 DIAGNOSIS — I739 Peripheral vascular disease, unspecified: Secondary | ICD-10-CM | POA: Diagnosis not present

## 2015-08-23 DIAGNOSIS — M89661 Osteopathy after poliomyelitis, right lower leg: Secondary | ICD-10-CM | POA: Diagnosis not present

## 2015-08-23 DIAGNOSIS — I25110 Atherosclerotic heart disease of native coronary artery with unstable angina pectoris: Secondary | ICD-10-CM | POA: Diagnosis not present

## 2015-08-23 DIAGNOSIS — M1990 Unspecified osteoarthritis, unspecified site: Secondary | ICD-10-CM | POA: Diagnosis not present

## 2015-08-23 DIAGNOSIS — I480 Paroxysmal atrial fibrillation: Secondary | ICD-10-CM | POA: Diagnosis not present

## 2015-08-23 DIAGNOSIS — Z471 Aftercare following joint replacement surgery: Secondary | ICD-10-CM | POA: Diagnosis not present

## 2015-08-24 DIAGNOSIS — M1990 Unspecified osteoarthritis, unspecified site: Secondary | ICD-10-CM | POA: Diagnosis not present

## 2015-08-24 DIAGNOSIS — I739 Peripheral vascular disease, unspecified: Secondary | ICD-10-CM | POA: Diagnosis not present

## 2015-08-24 DIAGNOSIS — M89661 Osteopathy after poliomyelitis, right lower leg: Secondary | ICD-10-CM | POA: Diagnosis not present

## 2015-08-24 DIAGNOSIS — I480 Paroxysmal atrial fibrillation: Secondary | ICD-10-CM | POA: Diagnosis not present

## 2015-08-24 DIAGNOSIS — I25110 Atherosclerotic heart disease of native coronary artery with unstable angina pectoris: Secondary | ICD-10-CM | POA: Diagnosis not present

## 2015-08-24 DIAGNOSIS — Z471 Aftercare following joint replacement surgery: Secondary | ICD-10-CM | POA: Diagnosis not present

## 2015-08-26 DIAGNOSIS — Z471 Aftercare following joint replacement surgery: Secondary | ICD-10-CM | POA: Diagnosis not present

## 2015-08-26 DIAGNOSIS — I739 Peripheral vascular disease, unspecified: Secondary | ICD-10-CM | POA: Diagnosis not present

## 2015-08-26 DIAGNOSIS — I25110 Atherosclerotic heart disease of native coronary artery with unstable angina pectoris: Secondary | ICD-10-CM | POA: Diagnosis not present

## 2015-08-26 DIAGNOSIS — M89661 Osteopathy after poliomyelitis, right lower leg: Secondary | ICD-10-CM | POA: Diagnosis not present

## 2015-08-26 DIAGNOSIS — M1990 Unspecified osteoarthritis, unspecified site: Secondary | ICD-10-CM | POA: Diagnosis not present

## 2015-08-26 DIAGNOSIS — I480 Paroxysmal atrial fibrillation: Secondary | ICD-10-CM | POA: Diagnosis not present

## 2015-08-30 DIAGNOSIS — I480 Paroxysmal atrial fibrillation: Secondary | ICD-10-CM | POA: Diagnosis not present

## 2015-08-30 DIAGNOSIS — M89661 Osteopathy after poliomyelitis, right lower leg: Secondary | ICD-10-CM | POA: Diagnosis not present

## 2015-08-30 DIAGNOSIS — Z471 Aftercare following joint replacement surgery: Secondary | ICD-10-CM | POA: Diagnosis not present

## 2015-08-30 DIAGNOSIS — I739 Peripheral vascular disease, unspecified: Secondary | ICD-10-CM | POA: Diagnosis not present

## 2015-08-30 DIAGNOSIS — M1990 Unspecified osteoarthritis, unspecified site: Secondary | ICD-10-CM | POA: Diagnosis not present

## 2015-08-30 DIAGNOSIS — I25110 Atherosclerotic heart disease of native coronary artery with unstable angina pectoris: Secondary | ICD-10-CM | POA: Diagnosis not present

## 2015-08-31 ENCOUNTER — Inpatient Hospital Stay (HOSPITAL_COMMUNITY): Payer: Commercial Managed Care - HMO

## 2015-08-31 ENCOUNTER — Encounter (HOSPITAL_COMMUNITY): Payer: Self-pay | Admitting: Emergency Medicine

## 2015-08-31 ENCOUNTER — Emergency Department (HOSPITAL_COMMUNITY): Payer: Commercial Managed Care - HMO

## 2015-08-31 ENCOUNTER — Inpatient Hospital Stay (HOSPITAL_COMMUNITY)
Admission: EM | Admit: 2015-08-31 | Discharge: 2015-09-06 | DRG: 871 | Disposition: A | Discharge status: Home Health | Payer: Commercial Managed Care - HMO | Attending: Internal Medicine | Admitting: Internal Medicine

## 2015-08-31 DIAGNOSIS — I739 Peripheral vascular disease, unspecified: Secondary | ICD-10-CM | POA: Diagnosis present

## 2015-08-31 DIAGNOSIS — R791 Abnormal coagulation profile: Secondary | ICD-10-CM | POA: Diagnosis present

## 2015-08-31 DIAGNOSIS — R6521 Severe sepsis with septic shock: Secondary | ICD-10-CM | POA: Diagnosis not present

## 2015-08-31 DIAGNOSIS — Z87891 Personal history of nicotine dependence: Secondary | ICD-10-CM | POA: Diagnosis not present

## 2015-08-31 DIAGNOSIS — D649 Anemia, unspecified: Secondary | ICD-10-CM | POA: Diagnosis present

## 2015-08-31 DIAGNOSIS — I5023 Acute on chronic systolic (congestive) heart failure: Secondary | ICD-10-CM | POA: Insufficient documentation

## 2015-08-31 DIAGNOSIS — R8271 Bacteriuria: Secondary | ICD-10-CM | POA: Diagnosis present

## 2015-08-31 DIAGNOSIS — R0902 Hypoxemia: Secondary | ICD-10-CM | POA: Diagnosis not present

## 2015-08-31 DIAGNOSIS — I252 Old myocardial infarction: Secondary | ICD-10-CM

## 2015-08-31 DIAGNOSIS — M1990 Unspecified osteoarthritis, unspecified site: Secondary | ICD-10-CM | POA: Diagnosis present

## 2015-08-31 DIAGNOSIS — J189 Pneumonia, unspecified organism: Secondary | ICD-10-CM | POA: Diagnosis not present

## 2015-08-31 DIAGNOSIS — I5043 Acute on chronic combined systolic (congestive) and diastolic (congestive) heart failure: Secondary | ICD-10-CM | POA: Diagnosis not present

## 2015-08-31 DIAGNOSIS — Z5181 Encounter for therapeutic drug level monitoring: Secondary | ICD-10-CM | POA: Diagnosis not present

## 2015-08-31 DIAGNOSIS — I2510 Atherosclerotic heart disease of native coronary artery without angina pectoris: Secondary | ICD-10-CM | POA: Diagnosis present

## 2015-08-31 DIAGNOSIS — E785 Hyperlipidemia, unspecified: Secondary | ICD-10-CM | POA: Diagnosis not present

## 2015-08-31 DIAGNOSIS — I213 ST elevation (STEMI) myocardial infarction of unspecified site: Secondary | ICD-10-CM | POA: Diagnosis not present

## 2015-08-31 DIAGNOSIS — I5032 Chronic diastolic (congestive) heart failure: Secondary | ICD-10-CM

## 2015-08-31 DIAGNOSIS — Z4682 Encounter for fitting and adjustment of non-vascular catheter: Secondary | ICD-10-CM | POA: Diagnosis not present

## 2015-08-31 DIAGNOSIS — Z7901 Long term (current) use of anticoagulants: Secondary | ICD-10-CM

## 2015-08-31 DIAGNOSIS — R739 Hyperglycemia, unspecified: Secondary | ICD-10-CM | POA: Diagnosis present

## 2015-08-31 DIAGNOSIS — Z79899 Other long term (current) drug therapy: Secondary | ICD-10-CM

## 2015-08-31 DIAGNOSIS — Z96652 Presence of left artificial knee joint: Secondary | ICD-10-CM | POA: Diagnosis present

## 2015-08-31 DIAGNOSIS — A419 Sepsis, unspecified organism: Principal | ICD-10-CM | POA: Diagnosis present

## 2015-08-31 DIAGNOSIS — E876 Hypokalemia: Secondary | ICD-10-CM | POA: Diagnosis not present

## 2015-08-31 DIAGNOSIS — Y95 Nosocomial condition: Secondary | ICD-10-CM | POA: Diagnosis present

## 2015-08-31 DIAGNOSIS — I509 Heart failure, unspecified: Secondary | ICD-10-CM

## 2015-08-31 DIAGNOSIS — I4891 Unspecified atrial fibrillation: Secondary | ICD-10-CM | POA: Diagnosis present

## 2015-08-31 DIAGNOSIS — I110 Hypertensive heart disease with heart failure: Secondary | ICD-10-CM | POA: Diagnosis not present

## 2015-08-31 DIAGNOSIS — I5033 Acute on chronic diastolic (congestive) heart failure: Secondary | ICD-10-CM | POA: Diagnosis not present

## 2015-08-31 DIAGNOSIS — J9622 Acute and chronic respiratory failure with hypercapnia: Secondary | ICD-10-CM | POA: Diagnosis not present

## 2015-08-31 DIAGNOSIS — I513 Intracardiac thrombosis, not elsewhere classified: Secondary | ICD-10-CM | POA: Diagnosis present

## 2015-08-31 DIAGNOSIS — J9691 Respiratory failure, unspecified with hypoxia: Secondary | ICD-10-CM | POA: Diagnosis present

## 2015-08-31 DIAGNOSIS — Z452 Encounter for adjustment and management of vascular access device: Secondary | ICD-10-CM | POA: Diagnosis not present

## 2015-08-31 DIAGNOSIS — I5041 Acute combined systolic (congestive) and diastolic (congestive) heart failure: Secondary | ICD-10-CM | POA: Diagnosis not present

## 2015-08-31 DIAGNOSIS — G9341 Metabolic encephalopathy: Secondary | ICD-10-CM | POA: Diagnosis present

## 2015-08-31 DIAGNOSIS — J9601 Acute respiratory failure with hypoxia: Secondary | ICD-10-CM | POA: Diagnosis not present

## 2015-08-31 DIAGNOSIS — J9621 Acute and chronic respiratory failure with hypoxia: Secondary | ICD-10-CM | POA: Diagnosis not present

## 2015-08-31 DIAGNOSIS — Z951 Presence of aortocoronary bypass graft: Secondary | ICD-10-CM | POA: Diagnosis not present

## 2015-08-31 DIAGNOSIS — Z8673 Personal history of transient ischemic attack (TIA), and cerebral infarction without residual deficits: Secondary | ICD-10-CM | POA: Diagnosis not present

## 2015-08-31 DIAGNOSIS — D696 Thrombocytopenia, unspecified: Secondary | ICD-10-CM | POA: Diagnosis not present

## 2015-08-31 DIAGNOSIS — M25462 Effusion, left knee: Secondary | ICD-10-CM | POA: Diagnosis not present

## 2015-08-31 DIAGNOSIS — M25469 Effusion, unspecified knee: Secondary | ICD-10-CM | POA: Insufficient documentation

## 2015-08-31 DIAGNOSIS — J9602 Acute respiratory failure with hypercapnia: Secondary | ICD-10-CM | POA: Insufficient documentation

## 2015-08-31 DIAGNOSIS — R069 Unspecified abnormalities of breathing: Secondary | ICD-10-CM | POA: Diagnosis not present

## 2015-08-31 DIAGNOSIS — R000 Tachycardia, unspecified: Secondary | ICD-10-CM | POA: Diagnosis not present

## 2015-08-31 DIAGNOSIS — I5020 Unspecified systolic (congestive) heart failure: Secondary | ICD-10-CM | POA: Diagnosis not present

## 2015-08-31 DIAGNOSIS — R0602 Shortness of breath: Secondary | ICD-10-CM | POA: Diagnosis not present

## 2015-08-31 DIAGNOSIS — M25461 Effusion, right knee: Secondary | ICD-10-CM | POA: Diagnosis not present

## 2015-08-31 HISTORY — DX: Urinary tract infection, site not specified: N39.0

## 2015-08-31 HISTORY — DX: Unspecified malignant neoplasm of skin, unspecified: C44.90

## 2015-08-31 HISTORY — DX: Personal history of other medical treatment: Z92.89

## 2015-08-31 LAB — GLUCOSE, CAPILLARY
GLUCOSE-CAPILLARY: 127 mg/dL — AB (ref 65–99)
GLUCOSE-CAPILLARY: 133 mg/dL — AB (ref 65–99)
Glucose-Capillary: 113 mg/dL — ABNORMAL HIGH (ref 65–99)
Glucose-Capillary: 135 mg/dL — ABNORMAL HIGH (ref 65–99)
Glucose-Capillary: 140 mg/dL — ABNORMAL HIGH (ref 65–99)

## 2015-08-31 LAB — TROPONIN I
TROPONIN I: 0.03 ng/mL (ref <0.031)
TROPONIN I: 0.11 ng/mL — AB (ref <0.031)
TROPONIN I: 0.16 ng/mL — AB (ref <0.031)

## 2015-08-31 LAB — CORTISOL: CORTISOL PLASMA: 7.1 ug/dL

## 2015-08-31 LAB — CBC WITH DIFFERENTIAL/PLATELET
Basophils Absolute: 0.1 10*3/uL (ref 0.0–0.1)
Basophils Relative: 1 %
EOS ABS: 0.3 10*3/uL (ref 0.0–0.7)
EOS PCT: 3 %
HCT: 40.3 % (ref 39.0–52.0)
Hemoglobin: 12.7 g/dL — ABNORMAL LOW (ref 13.0–17.0)
LYMPHS ABS: 2.3 10*3/uL (ref 0.7–4.0)
LYMPHS PCT: 18 %
MCH: 29.7 pg (ref 26.0–34.0)
MCHC: 31.5 g/dL (ref 30.0–36.0)
MCV: 94.4 fL (ref 78.0–100.0)
MONO ABS: 0.8 10*3/uL (ref 0.1–1.0)
Monocytes Relative: 6 %
Neutro Abs: 9.7 10*3/uL — ABNORMAL HIGH (ref 1.7–7.7)
Neutrophils Relative %: 72 %
PLATELETS: 271 10*3/uL (ref 150–400)
RBC: 4.27 MIL/uL (ref 4.22–5.81)
RDW: 14.7 % (ref 11.5–15.5)
WBC: 13.3 10*3/uL — AB (ref 4.0–10.5)

## 2015-08-31 LAB — COMPREHENSIVE METABOLIC PANEL
ALT: 16 U/L — ABNORMAL LOW (ref 17–63)
ANION GAP: 7 (ref 5–15)
AST: 23 U/L (ref 15–41)
Albumin: 4.1 g/dL (ref 3.5–5.0)
Alkaline Phosphatase: 74 U/L (ref 38–126)
BUN: 32 mg/dL — ABNORMAL HIGH (ref 6–20)
CHLORIDE: 106 mmol/L (ref 101–111)
CO2: 25 mmol/L (ref 22–32)
CREATININE: 1.21 mg/dL (ref 0.61–1.24)
Calcium: 8.6 mg/dL — ABNORMAL LOW (ref 8.9–10.3)
GFR, EST NON AFRICAN AMERICAN: 56 mL/min — AB (ref >60)
Glucose, Bld: 217 mg/dL — ABNORMAL HIGH (ref 65–99)
POTASSIUM: 4.2 mmol/L (ref 3.5–5.1)
SODIUM: 138 mmol/L (ref 135–145)
Total Bilirubin: 0.5 mg/dL (ref 0.3–1.2)
Total Protein: 7 g/dL (ref 6.5–8.1)

## 2015-08-31 LAB — POCT I-STAT 3, ART BLOOD GAS (G3+)
Acid-base deficit: 2 mmol/L (ref 0.0–2.0)
BICARBONATE: 23.7 meq/L (ref 20.0–24.0)
O2 SAT: 95 %
PCO2 ART: 42.3 mmHg (ref 35.0–45.0)
PO2 ART: 79 mmHg — AB (ref 80.0–100.0)
Patient temperature: 99.1
TCO2: 25 mmol/L (ref 0–100)
pH, Arterial: 7.357 (ref 7.350–7.450)

## 2015-08-31 LAB — PROTIME-INR
INR: 3.75 — AB (ref 0.00–1.49)
PROTHROMBIN TIME: 36.2 s — AB (ref 11.6–15.2)

## 2015-08-31 LAB — URINALYSIS, ROUTINE W REFLEX MICROSCOPIC
BILIRUBIN URINE: NEGATIVE
GLUCOSE, UA: NEGATIVE mg/dL
Ketones, ur: NEGATIVE mg/dL
NITRITE: NEGATIVE
PH: 5.5 (ref 5.0–8.0)
Protein, ur: NEGATIVE mg/dL
SPECIFIC GRAVITY, URINE: 1.02 (ref 1.005–1.030)

## 2015-08-31 LAB — BLOOD GAS, ARTERIAL
ACID-BASE EXCESS: 3.3 mmol/L — AB (ref 0.0–2.0)
Bicarbonate: 20.6 mEq/L (ref 20.0–24.0)
DRAWN BY: 317771
FIO2: 1
MECHVT: 600 mL
O2 Content: 100 L/min
O2 SAT: 98 %
PEEP/CPAP: 5 cmH2O
RATE: 14 resp/min
TCO2: 16.7 mmol/L (ref 0–100)
pCO2 arterial: 60.3 mmHg (ref 35.0–45.0)
pH, Arterial: 7.215 — ABNORMAL LOW (ref 7.350–7.450)
pO2, Arterial: 137 mmHg — ABNORMAL HIGH (ref 80.0–100.0)

## 2015-08-31 LAB — I-STAT CHEM 8, ED
BUN: 31 mg/dL — ABNORMAL HIGH (ref 6–20)
CALCIUM ION: 1.18 mmol/L (ref 1.13–1.30)
CREATININE: 1.2 mg/dL (ref 0.61–1.24)
Chloride: 104 mmol/L (ref 101–111)
GLUCOSE: 215 mg/dL — AB (ref 65–99)
HCT: 40 % (ref 39.0–52.0)
HEMOGLOBIN: 13.6 g/dL (ref 13.0–17.0)
POTASSIUM: 4.4 mmol/L (ref 3.5–5.1)
Sodium: 142 mmol/L (ref 135–145)
TCO2: 25 mmol/L (ref 0–100)

## 2015-08-31 LAB — MAGNESIUM
Magnesium: 2 mg/dL (ref 1.7–2.4)
Magnesium: 2 mg/dL (ref 1.7–2.4)

## 2015-08-31 LAB — MRSA PCR SCREENING: MRSA BY PCR: NEGATIVE

## 2015-08-31 LAB — PHOSPHORUS
PHOSPHORUS: 3 mg/dL (ref 2.5–4.6)
Phosphorus: 2.6 mg/dL (ref 2.5–4.6)

## 2015-08-31 LAB — URINE MICROSCOPIC-ADD ON: SQUAMOUS EPITHELIAL / LPF: NONE SEEN

## 2015-08-31 LAB — I-STAT CG4 LACTIC ACID, ED: LACTIC ACID, VENOUS: 2.67 mmol/L — AB (ref 0.5–2.0)

## 2015-08-31 LAB — BRAIN NATRIURETIC PEPTIDE: B NATRIURETIC PEPTIDE 5: 349 pg/mL — AB (ref 0.0–100.0)

## 2015-08-31 LAB — LACTIC ACID, PLASMA: LACTIC ACID, VENOUS: 1.4 mmol/L (ref 0.5–2.0)

## 2015-08-31 LAB — I-STAT TROPONIN, ED: Troponin i, poc: 0.02 ng/mL (ref 0.00–0.08)

## 2015-08-31 LAB — ECHOCARDIOGRAM COMPLETE
HEIGHTINCHES: 71 in
Weight: 3781.33 oz

## 2015-08-31 LAB — PROCALCITONIN: Procalcitonin: 0.4 ng/mL

## 2015-08-31 MED ORDER — PROPOFOL 1000 MG/100ML IV EMUL
5.0000 ug/kg/min | INTRAVENOUS | Status: DC
  Administered 2015-08-31: 10 ug/kg/min via INTRAVENOUS
  Administered 2015-08-31: 5 ug/kg/min via INTRAVENOUS

## 2015-08-31 MED ORDER — PRO-STAT SUGAR FREE PO LIQD
30.0000 mL | Freq: Every day | ORAL | Status: DC
  Administered 2015-08-31 – 2015-09-01 (×5): 30 mL
  Filled 2015-08-31 (×7): qty 30

## 2015-08-31 MED ORDER — PIPERACILLIN-TAZOBACTAM 3.375 G IVPB
3.3750 g | Freq: Three times a day (TID) | INTRAVENOUS | Status: DC
  Administered 2015-08-31 – 2015-09-01 (×4): 3.375 g via INTRAVENOUS
  Filled 2015-08-31 (×5): qty 50

## 2015-08-31 MED ORDER — PERFLUTREN LIPID MICROSPHERE
1.0000 mL | INTRAVENOUS | Status: AC | PRN
  Filled 2015-08-31: qty 10

## 2015-08-31 MED ORDER — SODIUM CHLORIDE 0.9 % IV BOLUS (SEPSIS): 1000.0000 mL | Freq: Once | INTRAVENOUS | Status: AC

## 2015-08-31 MED ORDER — FENTANYL CITRATE (PF) 2500 MCG/50ML IJ SOLN
25.0000 ug/h | INTRAMUSCULAR | Status: DC
  Filled 2015-08-31 (×2): qty 50

## 2015-08-31 MED ORDER — MIDAZOLAM HCL 2 MG/2ML IJ SOLN
2.0000 mg | Freq: Once | INTRAMUSCULAR | Status: AC
  Administered 2015-08-31: 2 mg via INTRAVENOUS
  Filled 2015-08-31: qty 2

## 2015-08-31 MED ORDER — MIDAZOLAM HCL 50 MG/10ML IJ SOLN
INTRAMUSCULAR | Status: AC
  Filled 2015-08-31: qty 1

## 2015-08-31 MED ORDER — FENTANYL BOLUS VIA INFUSION
25.0000 ug | INTRAVENOUS | Status: DC | PRN
  Filled 2015-08-31: qty 25

## 2015-08-31 MED ORDER — ETOMIDATE 2 MG/ML IV SOLN
INTRAVENOUS | Status: AC | PRN
  Administered 2015-08-31: 20 mg via INTRAVENOUS

## 2015-08-31 MED ORDER — CHLORHEXIDINE GLUCONATE 0.12% ORAL RINSE (MEDLINE KIT)
15.0000 mL | Freq: Two times a day (BID) | OROMUCOSAL | Status: DC
  Administered 2015-08-31 – 2015-09-06 (×12): 15 mL via OROMUCOSAL

## 2015-08-31 MED ORDER — MIDAZOLAM HCL 2 MG/2ML IJ SOLN
2.0000 mg | Freq: Once | INTRAMUSCULAR | Status: AC
  Administered 2015-08-31: 2 mg via INTRAVENOUS

## 2015-08-31 MED ORDER — SODIUM CHLORIDE 0.9 % IV SOLN
2.0000 mg/h | INTRAVENOUS | Status: DC
  Administered 2015-08-31: 2 mg/h via INTRAVENOUS
  Filled 2015-08-31: qty 10

## 2015-08-31 MED ORDER — PROPOFOL 1000 MG/100ML IV EMUL
INTRAVENOUS | Status: AC
  Administered 2015-08-31: 10 ug/kg/min via INTRAVENOUS
  Filled 2015-08-31: qty 100

## 2015-08-31 MED ORDER — BISACODYL 10 MG RE SUPP: 10.0000 mg | Freq: Every day | RECTAL | Status: DC | PRN

## 2015-08-31 MED ORDER — SODIUM CHLORIDE 0.9 % IV SOLN
20.0000 ug/h | INTRAVENOUS | Status: DC
  Administered 2015-08-31: 20 ug/h via INTRAVENOUS
  Filled 2015-08-31: qty 50

## 2015-08-31 MED ORDER — ANTISEPTIC ORAL RINSE SOLUTION (CORINZ): 7.0000 mL | Freq: Four times a day (QID) | OROMUCOSAL | Status: DC

## 2015-08-31 MED ORDER — SODIUM CHLORIDE 0.9 % IV BOLUS (SEPSIS)
1000.0000 mL | Freq: Once | INTRAVENOUS | Status: AC
  Administered 2015-08-31: 1000 mL via INTRAVENOUS

## 2015-08-31 MED ORDER — FENTANYL CITRATE (PF) 100 MCG/2ML IJ SOLN
50.0000 ug | Freq: Once | INTRAMUSCULAR | Status: AC
  Administered 2015-08-31: 50 ug via INTRAVENOUS
  Filled 2015-08-31: qty 2

## 2015-08-31 MED ORDER — ATORVASTATIN CALCIUM 40 MG PO TABS
40.0000 mg | ORAL_TABLET | Freq: Every day | ORAL | Status: DC
  Administered 2015-08-31 – 2015-09-01 (×2): 40 mg via ORAL
  Filled 2015-08-31 (×2): qty 1

## 2015-08-31 MED ORDER — ASPIRIN 300 MG RE SUPP
300.0000 mg | Freq: Once | RECTAL | Status: AC
  Administered 2015-08-31: 300 mg via RECTAL
  Filled 2015-08-31: qty 1

## 2015-08-31 MED ORDER — ANTISEPTIC ORAL RINSE SOLUTION (CORINZ)
7.0000 mL | Freq: Four times a day (QID) | OROMUCOSAL | Status: DC
  Administered 2015-08-31 – 2015-09-01 (×5): 7 mL via OROMUCOSAL

## 2015-08-31 MED ORDER — PRO-STAT SUGAR FREE PO LIQD
30.0000 mL | Freq: Two times a day (BID) | ORAL | Status: DC
  Administered 2015-08-31: 30 mL
  Filled 2015-08-31: qty 30

## 2015-08-31 MED ORDER — INSULIN ASPART 100 UNIT/ML ~~LOC~~ SOLN
0.0000 [IU] | SUBCUTANEOUS | Status: DC
  Administered 2015-08-31 – 2015-09-01 (×6): 2 [IU] via SUBCUTANEOUS

## 2015-08-31 MED ORDER — NOREPINEPHRINE BITARTRATE 1 MG/ML IV SOLN
2.0000 ug/min | INTRAVENOUS | Status: DC
  Administered 2015-08-31: 10 ug/min via INTRAVENOUS
  Filled 2015-08-31: qty 4

## 2015-08-31 MED ORDER — PIPERACILLIN-TAZOBACTAM 3.375 G IVPB
3.3750 g | Freq: Once | INTRAVENOUS | Status: AC
  Administered 2015-08-31: 3.375 g via INTRAVENOUS
  Filled 2015-08-31: qty 50

## 2015-08-31 MED ORDER — FUROSEMIDE 10 MG/ML IJ SOLN
40.0000 mg | Freq: Once | INTRAMUSCULAR | Status: DC
  Filled 2015-08-31: qty 4

## 2015-08-31 MED ORDER — PANTOPRAZOLE SODIUM 40 MG PO PACK
40.0000 mg | PACK | ORAL | Status: DC
  Administered 2015-08-31 – 2015-09-01 (×2): 40 mg
  Filled 2015-08-31 (×2): qty 20

## 2015-08-31 MED ORDER — MIDAZOLAM HCL 2 MG/2ML IJ SOLN: 1.0000 mg | INTRAMUSCULAR | Status: DC | PRN

## 2015-08-31 MED ORDER — ROCURONIUM BROMIDE 50 MG/5ML IV SOLN
INTRAVENOUS | Status: AC | PRN
  Administered 2015-08-31: 113.4 mg via INTRAVENOUS

## 2015-08-31 MED ORDER — FENTANYL CITRATE (PF) 100 MCG/2ML IJ SOLN
100.0000 ug | Freq: Once | INTRAMUSCULAR | Status: AC
  Administered 2015-08-31: 100 ug via INTRAVENOUS

## 2015-08-31 MED ORDER — MIDAZOLAM HCL 2 MG/2ML IJ SOLN
INTRAMUSCULAR | Status: AC
  Filled 2015-08-31: qty 2

## 2015-08-31 MED ORDER — NOREPINEPHRINE BITARTRATE 1 MG/ML IV SOLN
2.0000 ug/min | INTRAVENOUS | Status: DC
  Administered 2015-08-31: 2 ug/min via INTRAVENOUS
  Filled 2015-08-31: qty 16

## 2015-08-31 MED ORDER — CHLORHEXIDINE GLUCONATE 0.12% ORAL RINSE (MEDLINE KIT): 15.0000 mL | Freq: Two times a day (BID) | OROMUCOSAL | Status: DC

## 2015-08-31 MED ORDER — SODIUM CHLORIDE 0.9 % IV BOLUS (SEPSIS): 500.0000 mL | Freq: Once | INTRAVENOUS | Status: AC

## 2015-08-31 MED ORDER — FENTANYL CITRATE (PF) 2500 MCG/50ML IJ SOLN
INTRAMUSCULAR | Status: AC
  Filled 2015-08-31: qty 50

## 2015-08-31 MED ORDER — FENTANYL CITRATE (PF) 100 MCG/2ML IJ SOLN
INTRAMUSCULAR | Status: AC
  Filled 2015-08-31: qty 2

## 2015-08-31 MED ORDER — VANCOMYCIN HCL IN DEXTROSE 750-5 MG/150ML-% IV SOLN
750.0000 mg | INTRAVENOUS | Status: DC
Start: 2015-08-31 — End: 2015-09-01
  Administered 2015-08-31 – 2015-09-01 (×2): 750 mg via INTRAVENOUS
  Filled 2015-08-31 (×2): qty 150

## 2015-08-31 MED ORDER — SODIUM CHLORIDE 0.9 % IV SOLN
INTRAVENOUS | Status: DC
  Administered 2015-08-31: 06:00:00 via INTRAVENOUS

## 2015-08-31 MED ORDER — FUROSEMIDE 10 MG/ML IJ SOLN
40.0000 mg | Freq: Once | INTRAMUSCULAR | Status: AC
  Administered 2015-08-31: 40 mg via INTRAVENOUS
  Filled 2015-08-31: qty 4

## 2015-08-31 MED ORDER — VANCOMYCIN HCL IN DEXTROSE 1-5 GM/200ML-% IV SOLN
1000.0000 mg | Freq: Once | INTRAVENOUS | Status: AC
  Administered 2015-08-31: 1000 mg via INTRAVENOUS
  Filled 2015-08-31 (×2): qty 200

## 2015-08-31 MED ORDER — DOCUSATE SODIUM 50 MG/5ML PO LIQD: 100.0000 mg | Freq: Two times a day (BID) | ORAL | Status: DC | PRN

## 2015-08-31 MED ORDER — VITAL HIGH PROTEIN PO LIQD
1000.0000 mL | ORAL | Status: DC
  Administered 2015-08-31: 1000 mL
  Filled 2015-08-31: qty 1000

## 2015-08-31 MED ORDER — SODIUM CHLORIDE 0.9 % IV SOLN: INTRAVENOUS | Status: DC

## 2015-08-31 MED ORDER — SODIUM CHLORIDE 0.9 % IV BOLUS (SEPSIS)
500.0000 mL | Freq: Once | INTRAVENOUS | Status: AC
  Administered 2015-08-31: 06:00:00 via INTRAVENOUS

## 2015-08-31 NOTE — Progress Notes (Signed)
Versed 17cc's wasted in sink. Witnessed by Deboraha Sprang, RN and Rito Ehrlich, RN

## 2015-08-31 NOTE — ED Notes (Signed)
CRITICAL VALUE ALERT  Critical value received: pH 7.215, CO2 60.3, PO2 137, Bicarb 20.6, Sat 98  Date of notification:  08/31/2015  Time of notification:  0042  Critical value read back:Yes.    Nurse who received alert:  Charlies Silvers RN  MD notified (1st page):  [place]  Time of first page:  [phone]

## 2015-08-31 NOTE — H&P (Signed)
PULMONARY / CRITICAL CARE MEDICINE   Name: Benjamin Cordova MRN: [id number] DOB: [birth year]    ADMISSION DATE:  08/31/2015  REFERRING MD:  Dr. Wyvonnia Dusky  CHIEF COMPLAINT:  Short of breath  HISTORY OF PRESENT ILLNESS:   Hx from chart.  81 yo male had Lt TKR in April 2017.  He had redo CABG in July 2016.  He was in short term rehab, and was discharged.  He developed progressive shortness of breath and cough.  He went to [facility].  He was found to have gurgling respirations, hypoxia.  CXR showed infiltrate, and ABG showed hypercapnia.  He was intubated.  He was started on IV fluids and Abx for HCAP.  He was transferred to [facility].  PAST MEDICAL HISTORY :  He  has a past medical history of Coronary artery disease; Essential hypertension; Myocardial infarction ([place]) (1980); Arthritis; Hematuria; Bladder infection, acute; Peripheral vascular disease ([place]); Polio osteopathy of lower leg ([place]) (Age 64); TIA (transient ischemic attack); Stroke [facility]); Shortness of breath dyspnea; Pneumonia; and Headache.  PAST SURGICAL HISTORY: He  has past surgical history that includes Tonsillectomy (age 24); Foot surgery (1954); Coronary artery bypass graft (1981); Cystoscopy with injection (10/10/2010); Cardiac catheterization (N/A, 10/05/2014); Coronary artery bypass graft (N/A, 10/08/2014); TEE without cardioversion (N/A, 10/08/2014); Radial artery harvest (Left, 10/08/2014); and Total knee arthroplasty (Left, 07/27/2015).  No Known Allergies  No current facility-administered medications on file prior to encounter.   Current Outpatient Prescriptions on File Prior to Encounter  Medication Sig  . atenolol (TENORMIN) 50 MG tablet Take 1 tablet (50 mg total) by mouth daily.  Marland Kitchen atorvastatin (LIPITOR) 40 MG tablet Take 40 mg by mouth daily.  . B Complex-C-Folic Acid (SUPER B-COMPLEX/VIT C/FA) TABS Take 1 tablet by mouth daily.    . diazepam (VALIUM) 2 MG tablet Take 1 tablet (2 mg total) by mouth every 8 (eight) hours as needed for  muscle spasms.  . folic acid (FOLVITE) Q000111Q MCG tablet Take 800 mcg by mouth daily.   Marland Kitchen lisinopril (PRINIVIL,ZESTRIL) 20 MG tablet Take 1 tablet by mouth daily.  . magnesium oxide (MAG-OX) 400 MG tablet Take 400 mg by mouth at bedtime.  . Multiple Vitamins-Minerals (CERTAVITE/ANTIOXIDANTS) TABS Take 1 tablet by mouth daily.   . ondansetron (ZOFRAN) 4 MG tablet Take 4 mg by mouth every 4 (four) hours as needed for nausea or vomiting.  Marland Kitchen oxyCODONE-acetaminophen (ROXICET) 5-325 MG tablet Take 1-2 tablets by mouth every 4 (four) hours as needed.  . senna (SENOKOT) 8.6 MG tablet Take 1 tablet by mouth 2 (two) times daily.   . vitamin C (ASCORBIC ACID) 500 MG tablet Take 500 mg by mouth daily.    Marland Kitchen warfarin (COUMADIN) 10 MG tablet Take 10 mg by mouth daily. OR AS DIRECTED  . zinc oxide (BALMEX) 11.3 % CREA cream Apply ointment to buttocks every shift    FAMILY HISTORY:  His has no family status information on file.   SOCIAL HISTORY: He  reports that he quit smoking about 36 years ago. His smoking use included Cigarettes. He has a 28 pack-year smoking history. His smokeless tobacco use includes Snuff. He reports that he does not drink alcohol or use illicit drugs.  REVIEW OF SYSTEMS:   Unable to obtain  SUBJECTIVE:   VITAL SIGNS: BP 56/41 mmHg  Pulse 82  Temp(Src) 99.1 F (37.3 C)  Resp 16  Ht 5\' 11"  (1.803 m)  Wt 236 lb 5.3 oz (107.2 kg)  BMI 32.98 kg/m2  SpO2 100%  HEMODYNAMICS:    VENTILATOR SETTINGS: Vent Mode:  [-] PRVC FiO2 (%):  [60 %-100 %] 60 % Set Rate:  [14 bmp-16 bmp] 16 bmp Vt Set:  [600 mL] 600 mL PEEP:  [5 cmH20] 5 cmH20 Plateau Pressure:  [24 cmH20-25 cmH20] 24 cmH20  INTAKE / OUTPUT:    PHYSICAL EXAMINATION: General:  Ill appearing Neuro:  RASS -3 HEENT:  Pupils pinpoint, ETT in place Cardiovascular:  Irregular, no murmur Lungs: crackles Rt > Lt  Abdomen:  Soft, non tender, +bs Musculoskeletal:  Mild swelling Lt knee >> no erythema or tenderness Skin:   No rashes  LABS:  BMET  Recent Labs Lab 08/31/15 0055 08/31/15 0058  NA 142 138  K 4.4 4.2  CL 104 106  CO2  --  25  BUN 31* 32*  CREATININE 1.20 1.21  GLUCOSE 215* 217*    Electrolytes  Recent Labs Lab 08/31/15 0058  CALCIUM 8.6*    CBC  Recent Labs Lab 08/31/15 0055 08/31/15 0058  WBC  --  13.3*  HGB 13.6 12.7*  HCT 40.0 40.3  PLT  --  271    Coag's  Recent Labs Lab 08/31/15 0058  INR 3.75*    Sepsis Markers  Recent Labs Lab 08/31/15 0055  LATICACIDVEN 2.67*    ABG  Recent Labs Lab 08/31/15 0031  PHART 7.215*  PCO2ART 60.3*  PO2ART 137*    Liver Enzymes  Recent Labs Lab 08/31/15 0058  AST 23  ALT 16*  ALKPHOS 74  BILITOT 0.5  ALBUMIN 4.1    Cardiac Enzymes  Recent Labs Lab 08/31/15 0058  TROPONINI 0.03    Glucose  Recent Labs Lab 08/31/15 0504  GLUCAP 113*    Imaging Dg Chest Portable 1 View  08/31/2015  CLINICAL DATA:  Endotracheal tube placement.  Shortness of breath. EXAM: PORTABLE CHEST 1 VIEW COMPARISON:  12/15/2014 FINDINGS: Endotracheal tube 5.9 cm from the carina. Patient is post median sternotomy. Heart at the upper limits normal in size. Right greater than left diffuse opacities, primarily perihilar. Suspect small pleural effusions and fluid in the right minor fissure. No evidence of pneumothorax. IMPRESSION: 1. Endotracheal tube 5.9 cm from the carina. 2. Diffuse right greater than left opacities, may be pulmonary edema, aspiration or pneumonia. Suspect small pleural effusions. Electronically Signed   By: Jeb Levering M.D.   On: 08/31/2015 00:38     STUDIES:   CULTURES: 5/31 urine >> 5/31 Blood >>  5/31 legionella Ag >> 5/31 pneumococcal Ag >>  ANTIBIOTICS: 5/31 Vancomycin >> 5/31 Zosyn >>   SIGNIFICANT EVENTS: 5/31 Transfer from [facility] ER to [facility]  LINES/TUBES: 5/31 ETT >> 5/31 CVL >>   DISCUSSION: 81 yo male with recent Lt TKR and rehab stay presents with cough, dyspnea and acute  hypoxic/hypercapnic respiratory failure from HCAP, and sepsis.  He has hx of CAD s/p CABG, A fib on coumadin, CVA, HTN, Polio.  ASSESSMENT / PLAN:  PULMONARY A: Acute hypoxic/hypercapnic respiratory failure 2nd to HCAP. P:   Full vent support F/u CXR, ABG  CARDIOVASCULAR A:  Septic shock 2nd to HCAP. Hx of CAD, A fib, HTN, HLD. P:  Continue lipitor Hold coumadin for now Pressors to keep MAP > 65 Continue IV fluids Monitor CVP F/u cortisol level  RENAL A:   No acute issues. P:   Monitor renal fx, urine outpt  GASTROINTESTINAL A:   Nutrition. P:   Tube feeds Protonix for SUP  HEMATOLOGIC A:   No acute issues. P:  F/u  CBC SCDs  INFECTIOUS A:   Septic shock from HCAP. P:   Day 1 vancomycin, zosyn  ENDOCRINE A:   Hyperglycemia. P:   SSI  NEUROLOGIC A:   Acute metabolic encephalopathy P:   RASS goal -1 to -2   CC time 48 minutes.  Chesley Mires, MD [facility] Pulmonary/Critical Care 08/31/2015, 5:54 AM Pager:  [phone] After 3pm call: [phone]

## 2015-08-31 NOTE — ED Notes (Signed)
SOB tonight. Gurgling respirations per EMS. Had knee surgery 5 weeks ago.

## 2015-08-31 NOTE — H&P (Addendum)
PULMONARY / CRITICAL CARE MEDICINE   Name: Benjamin Cordova MRN: [id number] DOB: July 29, 1934    ADMISSION DATE:  08/31/2015  REFERRING MD:  Dr. Wyvonnia Dusky  CHIEF COMPLAINT:  Short of breath  HISTORY OF PRESENT ILLNESS:   Hx from chart.  81 yo male had Lt TKR in April 2017.  He had redo CABG in July 2016.  He was in short term rehab, and was discharged.  He developed progressive shortness of breath and cough.  He went to [facility].  He was found to have gurgling respirations, hypoxia.  CXR showed infiltrate, and ABG showed hypercapnia.  He was intubated.  He was started on IV fluids and Abx for HCAP.  He was transferred to [facility].  SUBJECTIVE: calm on vent, off pressors, fent at 50  VITAL SIGNS: BP 103/58 mmHg  Pulse 82  Temp(Src) 98.8 F (37.1 C) (Oral)  Resp 16  Ht 5\' 11"  (1.803 m)  Wt 107.2 kg (236 lb 5.3 oz)  BMI 32.98 kg/m2  SpO2 100%  HEMODYNAMICS: CVP:  [10 mmHg] 10 mmHg  VENTILATOR SETTINGS: Vent Mode:  [-] PRVC FiO2 (%):  [40 %-100 %] 40 % Set Rate:  [14 bmp-16 bmp] 16 bmp Vt Set:  [600 mL] 600 mL PEEP:  [5 cmH20] 5 cmH20 Plateau Pressure:  [22 cmH20-25 cmH20] 22 cmH20  INTAKE / OUTPUT: I/O last 3 completed shifts: In: 267.7 [I.V.:117.7; IV Piggyback:150] Out: 2575 [Urine:2575]  PHYSICAL EXAMINATION: General:  Ill appearing Neuro:  RASS -1, fc HEENT:  Pupils pinpoint, ETT in place Cardiovascular:  s1 s2 Irregular, no murmur Lungs: crackles Rt ronchi improved, reduced  Abdomen:  Soft, non tender, +bs Musculoskeletal:  Mild swelling Lt knee >> no erythema or tenderness Skin:  No rashes  LABS:  BMET  Recent Labs Lab 08/31/15 0055 08/31/15 0058  NA 142 138  K 4.4 4.2  CL 104 106  CO2  --  25  BUN 31* 32*  CREATININE 1.20 1.21  GLUCOSE 215* 217*    Electrolytes  Recent Labs Lab 08/31/15 0058  CALCIUM 8.6*    CBC  Recent Labs Lab 08/31/15 0055 08/31/15 0058  WBC  --  13.3*  HGB 13.6 12.7*  HCT 40.0 40.3  PLT  --  271    Coag's  Recent  Labs Lab 08/31/15 0058  INR 3.75*    Sepsis Markers  Recent Labs Lab 08/31/15 0055  LATICACIDVEN 2.67*    ABG  Recent Labs Lab 08/31/15 0031 08/31/15 0646  PHART 7.215* 7.357  PCO2ART 60.3* 42.3  PO2ART 137* 79.0*    Liver Enzymes  Recent Labs Lab 08/31/15 0058  AST 23  ALT 16*  ALKPHOS 74  BILITOT 0.5  ALBUMIN 4.1    Cardiac Enzymes  Recent Labs Lab 08/31/15 0058  TROPONINI 0.03    Glucose  Recent Labs Lab 08/31/15 0504 08/31/15 0837  GLUCAP 113* 135*    Imaging Dg Chest Port 1 View  08/31/2015  CLINICAL DATA:  Hypoxia.  Central catheter placed EXAM: PORTABLE CHEST 1 VIEW COMPARISON:  Study obtained earlier in the day FINDINGS: Endotracheal tube tip is 5.8 cm above carina. Central catheter tip is in the superior vena cava near the cavoatrial junction. Nasogastric tube tip and side port are below the diaphragm. No pneumothorax. There is less airspace consolidation compared to earlier in the day. There is patchy airspace disease currently with underlying interstitial edema and bilateral pleural effusions. There is cardiomegaly with mild pulmonary venous hypertension. No new opacity. No adenopathy evident.  IMPRESSION: Tube and catheter positions as described without pneumothorax. Evidence a degree of congestive heart failure. Considerably less alveolar consolidation is noted compared to earlier in the day. No new opacity currently. Electronically Signed   By: Lowella Grip III M.D.   On: 08/31/2015 08:00   Dg Chest Portable 1 View  08/31/2015  CLINICAL DATA:  Endotracheal tube placement.  Shortness of breath. EXAM: PORTABLE CHEST 1 VIEW COMPARISON:  12/15/2014 FINDINGS: Endotracheal tube 5.9 cm from the carina. Patient is post median sternotomy. Heart at the upper limits normal in size. Right greater than left diffuse opacities, primarily perihilar. Suspect small pleural effusions and fluid in the right minor fissure. No evidence of pneumothorax.  IMPRESSION: 1. Endotracheal tube 5.9 cm from the carina. 2. Diffuse right greater than left opacities, may be pulmonary edema, aspiration or pneumonia. Suspect small pleural effusions. Electronically Signed   By: Jeb Levering M.D.   On: 08/31/2015 00:38   Dg Knee Left Port  08/31/2015  CLINICAL DATA:  Left knee swelling and tenderness. Status post left knee replacement. EXAM: PORTABLE LEFT KNEE - 1-2 VIEW COMPARISON:  07/27/2015 FINDINGS: No fracture. No bone lesion. Femoral tibial prosthetic components appear well seated and aligned without evidence of loosening. There is a large joint effusion. Subcutaneous edema is seen diffusely. IMPRESSION: 1. No fracture or bone lesion. 2. No evidence of prosthetic loosening. 3. Large joint effusion and nonspecific soft tissue edema. Electronically Signed   By: Lajean Manes M.D.   On: 08/31/2015 08:23   Dg Abd Portable 1v  08/31/2015  CLINICAL DATA:  Nasogastric tube placement EXAM: PORTABLE ABDOMEN - 1 VIEW COMPARISON:  CT abdomen and pelvis September 13, 2010 FINDINGS: Nasogastric tube tip and side port are in the stomach with the nasogastric tube tip near the pylorus. There is diffuse stool throughout colon. There is no bowel dilatation or air-fluid level suggesting obstruction. No free air. IMPRESSION: Nasogastric tube tip and side port in stomach. Overall bowel gas pattern unremarkable. Diffuse stool noted in colon. Electronically Signed   By: Lowella Grip III M.D.   On: 08/31/2015 08:23     STUDIES:   CULTURES: 5/31 urine >> 5/31 Blood >>  5/31 legionella Ag >> 5/31 pneumococcal Ag >>  ANTIBIOTICS: 5/31 Vancomycin >> 5/31 Zosyn >>   SIGNIFICANT EVENTS: 5/31 Transfer from [facility] ER to [facility] 5/31- off pressors  LINES/TUBES: 5/31 ETT >> 5/31 left IJ CVL >>   DISCUSSION: 81 yo male with recent Lt TKR and rehab stay presents with cough, dyspnea and acute hypoxic/hypercapnic respiratory failure from HCAP, and sepsis.  He has hx of CAD s/p CABG,  A fib on coumadin, CVA, HTN, Polio.  ASSESSMENT / PLAN:  PULMONARY A: Acute hypoxic/hypercapnic respiratory failure 2nd to HCAP?? Vs failure P:   pcxr c/w edema to me abg reviewed, keep same MV Was neg over 2 liters already, okay as BP improved Consider sbt, no extubation planned  CARDIOVASCULAR A:  Unclear if this is sepsis Hx of CAD, A fib, HTN, HLD. R/o chf P:  Continue lipitor Pressors to keep MAP > 60 Allow neg balance on own Continue IV fluids - to kvo Monitor CVP - 10 F/u cortisol level- pending Get echo as more concern failure noted, trop neg  RENAL A:   No acute issues. P:   Monitor renal fx, urine outpt Chem this am about to be sent will follow kvo  GASTROINTESTINAL A:   Nutrition. P:   Tube feeds start Follow BM Protonix for  SUP  HEMATOLOGIC A:   No acute issues. P:  F/u CBC SCDs  INFECTIOUS A:   Septic shock from HCAP unclear to me P:   Day 1 vancomycin, zosyn - maintain But if rapid resolution of resp failure with lasix and pct down, may dc in 48  Hrs follow culture   ENDOCRINE A:   Hyperglycemia. P:   SSI  NEUROLOGIC A:   Acute metabolic encephalopathy P:   RASS goal -1 Avoid benzo as able  Ccm time 45 min   Lavon Paganini. Titus Mould, MD, [place] Pgr: [place] [facility]

## 2015-08-31 NOTE — ED Notes (Signed)
Pt given 20mg  Etomidate, 100mg  Rocuronium at 0018. Tube placed at 0020 with positive color change.

## 2015-08-31 NOTE — Progress Notes (Signed)
Pharmacy Antibiotic Note  Benjamin Cordova is a 81 y.o. male admitted on 08/31/2015 with SOB/VDRF, possible PNA.  Pharmacy has been consulted for Vancomycin and Zosyn dosing.  Vancomycin 1 g IV given in ED at  [place]: Vancomycin 750 mg IV q24h Zosyn 3.375 g IV q8h   Height: 5\' 11"  (180.3 cm) Weight: 236 lb 5.3 oz (107.2 kg) IBW/kg (Calculated) : 75.3  Temp (24hrs), Avg:99.4 F (37.4 C), Min:95.4 F (35.2 C), Max:100.6 F (38.1 C)   Recent Labs Lab 08/31/15 0055 08/31/15 0058  WBC  --  13.3*  CREATININE 1.20 1.21  LATICACIDVEN 2.67*  --     Estimated Creatinine Clearance: 62.7 mL/min (by C-G formula based on Cr of 1.21).    No Known Allergies   Caryl Pina 08/31/2015 5:36 AM

## 2015-08-31 NOTE — Progress Notes (Signed)
Initial Nutrition Assessment  DOCUMENTATION CODES:   Obesity unspecified  INTERVENTION:    Initiate TF via OGT with Vital High Protein at goal rate of 40 ml/h (960 ml per day) and Prostat 30 ml 5 times daily to provide 1460 kcals, 159 gm protein, 803 ml free water daily.  NUTRITION DIAGNOSIS:   Inadequate oral intake related to inability to eat as evidenced by NPO status.  GOAL:   Provide needs based on ASPEN/SCCM guidelines  MONITOR:   Vent status, Labs, Weight trends, TF tolerance, I & O's  REASON FOR ASSESSMENT:   Consult Enteral/tube feeding initiation and management  ASSESSMENT:   81 yo male with recent Lt TKR and rehab stay presents with cough, dyspnea and acute hypoxic/hypercapnic respiratory failure from HCAP, and sepsis.  Nutrition-Focused physical exam completed. Findings are no fat depletion, no muscle depletion, and moderate edema.  Labs and medications reviewed. Received MD Consult for TF initiation and management. Patient is currently intubated on ventilator support Temp (24hrs), Avg:99.3 F (37.4 C), Min:95.4 F (35.2 C), Max:100.6 F (38.1 C)   Diet Order:  Diet NPO time specified  Skin:  Reviewed, no issues  Last BM:  unknown  Height:   Ht Readings from Last 1 Encounters:  08/31/15 5\' 11"  (1.803 m)    Weight:   Wt Readings from Last 1 Encounters:  08/31/15 236 lb 5.3 oz (107.2 kg)    Ideal Body Weight:  78.2 kg  BMI:  Body mass index is 32.98 kg/(m^2).  Estimated Nutritional Needs:   Kcal:  [id number]  Protein:  > 155 gm  Fluid:  2 L  EDUCATION NEEDS:   No education needs identified at this time   Molli Barrows, [place], [place], [place] Pager [phone] After Hours Pager [phone]

## 2015-08-31 NOTE — Procedures (Signed)
Central Venous Catheter Insertion Procedure Note Benjamin Cordova [id number] [birth year]  Procedure: Insertion of Central Venous Catheter Indications: Assessment of intravascular volume  Procedure Details Consent: Unable to obtain consent because of altered level of consciousness. Time Out: Verified patient identification, verified procedure, site/side was marked, verified correct patient position, special equipment/implants available, medications/allergies/relevent history reviewed, required imaging and test results available.  Performed  Maximum sterile technique was used including antiseptics, cap, gloves, gown, hand hygiene, mask and sheet. Skin prep: Chlorhexidine; local anesthetic administered A antimicrobial bonded/coated triple lumen catheter was placed in the left internal jugular vein using the Seldinger technique.  Placed with ultrasound guidance.  Evaluation Blood flow good Complications: No apparent complications Patient did tolerate procedure well. Chest X-ray ordered to verify placement.  CXR: pending.  Benjamin Mires, MD [facility] 08/31/2015, 6:29 AM Pager:  [phone] After 3pm call: [phone]

## 2015-08-31 NOTE — ED Provider Notes (Signed)
CSN: [id number]     Arrival date & time 08/31/15  0010 History  By signing my name below, I, Nicole Kindred, attest that this documentation has been prepared under the direction and in the presence of Ezequiel Essex, MD.   Electronically Signed: Nicole Kindred, ED Scribe. 08/31/2015. 12:22 AM   Chief Complaint  Patient presents with  . Shortness of Breath    The history is provided by the patient. The history is limited by the condition of the patient. No language interpreter was used.  HPI Comments: Benjamin Cordova is a 81 y.o. male who presents to the Emergency Department via EMS complaining of gradual onset, worsening, shortness of breath, ongoing since this morning, worsening in the last hour. Pt reports associated cough. No other associated symptoms noted. No worsening or alleviating factors noted. Pt denies chest pain, abdominal pain, or any other pertinent symptoms. Pt is currently on coumadin. Pt had total knee arthroplasty 5 weeks ago and coronary artery bypass 5 months ago.  Past Medical History  Diagnosis Date  . Coronary artery disease     Multivessel s/p CABG 1981 at [facility]  . Essential hypertension   . Myocardial infarction ([place]) 1980  . Arthritis   . Hematuria   . Bladder infection, acute   . Peripheral vascular disease ([place])   . 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 TIA (transient ischemic attack)     Chronic coumadin  . Stroke ([place])   . Shortness of breath dyspnea   . Pneumonia   . Headache    Past Surgical History  Procedure Laterality Date  . Tonsillectomy  age 31    [facility]  . Foot surgery  1954    [facility], muscle implantation  . Coronary artery bypass graft  1981    [facility]  . Cystoscopy with injection  10/10/2010    Procedure: CYSTOSCOPY WITH INJECTION;  Surgeon: Marissa Nestle;  Location: [facility];  Service: Urology;  Laterality: N/A;  with retrograde urethrogram  . Cardiac catheterization N/A 10/05/2014    Procedure:  Left Heart Cath and Coronary Angiography;  Surgeon: Peter M Martinique, MD;  Location: [place] [facility];  Service: Cardiovascular;  Laterality: N/A;  . Coronary artery bypass graft N/A 10/08/2014    Procedure: REDO CORONARY ARTERY BYPASS GRAFTING (CABG) Times Two Grafts using Left Internal Mammary and Left Radial Artery;  Surgeon: Melrose Nakayama, MD;  Location: [place];  Service: Open Heart Surgery;  Laterality: N/A;  . Tee without cardioversion N/A 10/08/2014    Procedure: TRANSESOPHAGEAL ECHOCARDIOGRAM (TEE);  Surgeon: Melrose Nakayama, MD;  Location: [place];  Service: Open Heart Surgery;  Laterality: N/A;  . Radial artery harvest Left 10/08/2014    Procedure: Left RADIAL ARTERY HARVEST;  Surgeon: Melrose Nakayama, MD;  Location: [place];  Service: Open Heart Surgery;  Laterality: Left;  . Total knee arthroplasty Left 07/27/2015    Procedure: TOTAL LEFT KNEE ARTHROPLASTY;  Surgeon: [name], MD;  Location: [place];  Service: Orthopedics;  Laterality: Left;   Family History  Problem Relation Age of Onset  . Anesthesia problems Neg Hx   . Hypotension Neg Hx   . Malignant hyperthermia Neg Hx   . Pseudochol deficiency Neg Hx    Social History  Substance Use Topics  . Smoking status: Former Smoker -- 1.00 packs/day for 28 years    Types: Cigarettes    Quit date: 12/02/1978  . Smokeless  tobacco: Current User    Types: Snuff  . Alcohol Use: No    Review of Systems  Unable to perform ROS: Acuity of condition  A complete 10 system review of systems was obtained and all systems are negative except as noted in the HPI and PMH.    Allergies  Review of patient's allergies indicates no known allergies.  Home Medications   Prior to Admission medications   Medication Sig Start Date End Date Taking? Authorizing Provider  atenolol (TENORMIN) 50 MG tablet Take 1 tablet (50 mg total) by mouth daily. 03/07/15   Satira Sark, MD  atorvastatin (LIPITOR) 40 MG tablet Take 40 mg by mouth  daily.    Historical Provider, MD  B Complex-C-Folic Acid (SUPER B-COMPLEX/VIT C/FA) TABS Take 1 tablet by mouth daily.      Historical Provider, MD  diazepam (VALIUM) 2 MG tablet Take 1 tablet (2 mg total) by mouth every 8 (eight) hours as needed for muscle spasms. 07/27/15   Aundra Dubin, PA-C  enoxaparin (LOVENOX) 40 MG/0.4ML injection Inject 40 mg into the skin every 12 (twelve) hours.    Historical Provider, MD  folic acid (FOLVITE) Q000111Q MCG tablet Take 800 mcg by mouth daily.     Historical Provider, MD  lisinopril (PRINIVIL,ZESTRIL) 20 MG tablet Take 1 tablet by mouth daily. 06/27/15   Historical Provider, MD  magnesium oxide (MAG-OX) 400 MG tablet Take 400 mg by mouth at bedtime.    Historical Provider, MD  Multiple Vitamins-Minerals (CERTAVITE/ANTIOXIDANTS) TABS Take 1 tablet by mouth daily.     Historical Provider, MD  ondansetron (ZOFRAN) 4 MG tablet Take 4 mg by mouth every 4 (four) hours as needed for nausea or vomiting.    Historical Provider, MD  oxyCODONE-acetaminophen (ROXICET) 5-325 MG tablet Take 1-2 tablets by mouth every 4 (four) hours as needed. 07/27/15   Aundra Dubin, PA-C  senna (SENOKOT) 8.6 MG tablet Take 1 tablet by mouth 2 (two) times daily.     Historical Provider, MD  vitamin C (ASCORBIC ACID) 500 MG tablet Take 500 mg by mouth daily.      Historical Provider, MD  warfarin (COUMADIN) 10 MG tablet Take 10 mg by mouth daily. OR AS DIRECTED    Historical Provider, MD  zinc oxide (BALMEX) 11.3 % CREA cream Apply ointment to buttocks every shift    Historical Provider, MD   BP 164/98 mmHg  Pulse 132  Temp(Src) 95.4 F (35.2 C)  Resp 14  Ht 6' (1.829 m)  Wt 250 lb (113.399 kg)  BMI 33.90 kg/m2  SpO2 100% Physical Exam  Constitutional: He is oriented to person, place, and time. He appears well-developed and well-nourished. He appears distressed.  HENT:  Head: Normocephalic and atraumatic.  Mouth/Throat: Oropharynx is clear and moist. No oropharyngeal exudate.   Eyes: Conjunctivae and EOM are normal. Pupils are equal, round, and reactive to light.  Neck: Normal range of motion. Neck supple.  No meningismus.  Cardiovascular: Normal heart sounds and intact distal pulses.  Tachycardia present.   No murmur heard. tachycardic  Pulmonary/Chest: Tachypnea noted. He is in respiratory distress. He has rales.  Diffuse crackles throughout. Respiratory distress in extremis. Speaking in 1-2, short phrases.  Abdominal: Soft. There is no tenderness. There is no rebound and no guarding.  Musculoskeletal: Normal range of motion. He exhibits edema. He exhibits no tenderness.  Diffuse swelling of left leg with surgical incisions to anterior left knee. PT and DP pulses intact.   Neurological:  He is alert and oriented to person, place, and time. No cranial nerve deficit. He exhibits normal muscle tone. Coordination normal.  Moves all extremities.   Skin: Skin is warm.  Psychiatric: He has a normal mood and affect. His behavior is normal.  Nursing note and vitals reviewed.   ED Course  .Intubation Date/Time: 08/31/2015 1:19 AM Performed by: Ezequiel Essex Authorized by: Ezequiel Essex Consent: The procedure was performed in an emergent situation. Verbal consent obtained. Risks and benefits: risks, benefits and alternatives were discussed Consent given by: patient Patient identity confirmed: provided demographic data and verbally with patient Time out: Immediately prior to procedure a "time out" was called to verify the correct patient, procedure, equipment, support staff and site/side marked as required. Indications: respiratory distress and  hypoxemia Intubation method: video-assisted Patient status: paralyzed (RSI) Preoxygenation: nonrebreather mask Sedatives: etomidate Paralytic: rocuronium Laryngoscope size: Mac 4 Tube size: 7.5 mm Tube type: cuffed Number of attempts: 1 Ventilation between attempts: BVM Cricoid pressure: no Cords visualized:  yes Post-procedure assessment: chest rise Breath sounds: equal Cuff inflated: yes ETT to lip: 24 cm Tube secured with: ETT holder Chest x-ray findings: endotracheal tube in appropriate position Patient tolerance: Patient tolerated the procedure well with no immediate complications   (including critical care time) DIAGNOSTIC STUDIES: Oxygen Saturation is 100% on intubation, normal by my interpretation.    COORDINATION OF CARE: 12:22 AM Discussed treatment plan with pt at bedside and pt agreed to plan.  Labs Review Labs Reviewed  CBC WITH DIFFERENTIAL/PLATELET - Abnormal; Notable for the following:    WBC 13.3 (*)    Hemoglobin 12.7 (*)    Neutro Abs 9.7 (*)    All other components within normal limits  COMPREHENSIVE METABOLIC PANEL - Abnormal; Notable for the following:    Glucose, Bld 217 (*)    BUN 32 (*)    Calcium 8.6 (*)    ALT 16 (*)    GFR calc non Af Amer 56 (*)    All other components within normal limits  PROTIME-INR - Abnormal; Notable for the following:    Prothrombin Time 36.2 (*)    INR 3.75 (*)    All other components within normal limits  BRAIN NATRIURETIC PEPTIDE - Abnormal; Notable for the following:    B Natriuretic Peptide 349.0 (*)    All other components within normal limits  URINALYSIS, ROUTINE W REFLEX MICROSCOPIC (NOT AT [facility]) - Abnormal; Notable for the following:    Hgb urine dipstick SMALL (*)    Leukocytes, UA MODERATE (*)    All other components within normal limits  BLOOD GAS, ARTERIAL - Abnormal; Notable for the following:    pH, Arterial 7.215 (*)    pCO2 arterial 60.3 (*)    pO2, Arterial 137 (*)    Acid-Base Excess 3.3 (*)    All other components within normal limits  URINE MICROSCOPIC-ADD ON - Abnormal; Notable for the following:    Bacteria, UA FEW (*)    All other components within normal limits  GLUCOSE, CAPILLARY - Abnormal; Notable for the following:    Glucose-Capillary 113 (*)    All other components within normal limits   GLUCOSE, CAPILLARY - Abnormal; Notable for the following:    Glucose-Capillary 135 (*)    All other components within normal limits  I-STAT CG4 LACTIC ACID, ED - Abnormal; Notable for the following:    Lactic Acid, Venous 2.67 (*)    All other components within normal limits  I-STAT CHEM 8, ED -  Abnormal; Notable for the following:    BUN 31 (*)    Glucose, Bld 215 (*)    All other components within normal limits  POCT I-STAT 3, ART BLOOD GAS (G3+) - Abnormal; Notable for the following:    pO2, Arterial 79.0 (*)    All other components within normal limits  CULTURE, BLOOD (ROUTINE X 2)  CULTURE, BLOOD (ROUTINE X 2)  MRSA PCR SCREENING  URINE CULTURE  CULTURE, RESPIRATORY (NON-EXPECTORATED)  TROPONIN I  MAGNESIUM  MAGNESIUM  PHOSPHORUS  PHOSPHORUS  PROCALCITONIN  CORTISOL  LEGIONELLA PNEUMOPHILA SEROGP 1 UR AG  STREP PNEUMONIAE URINARY ANTIGEN  LACTIC ACID, PLASMA  TROPONIN I  TROPONIN I  TROPONIN I  BLOOD GAS, ARTERIAL  PROCALCITONIN  I-STAT TROPOININ, ED  I-STAT CG4 LACTIC ACID, ED    Imaging Review Dg Chest Port 1 View  08/31/2015  CLINICAL DATA:  Hypoxia.  Central catheter placed EXAM: PORTABLE CHEST 1 VIEW COMPARISON:  Study obtained earlier in the day FINDINGS: Endotracheal tube tip is 5.8 cm above carina. Central catheter tip is in the superior vena cava near the cavoatrial junction. Nasogastric tube tip and side port are below the diaphragm. No pneumothorax. There is less airspace consolidation compared to earlier in the day. There is patchy airspace disease currently with underlying interstitial edema and bilateral pleural effusions. There is cardiomegaly with mild pulmonary venous hypertension. No new opacity. No adenopathy evident. IMPRESSION: Tube and catheter positions as described without pneumothorax. Evidence a degree of congestive heart failure. Considerably less alveolar consolidation is noted compared to earlier in the day. No new opacity currently.  Electronically Signed   By: Lowella Grip III M.D.   On: 08/31/2015 08:00   Dg Chest Portable 1 View  08/31/2015  CLINICAL DATA:  Endotracheal tube placement.  Shortness of breath. EXAM: PORTABLE CHEST 1 VIEW COMPARISON:  12/15/2014 FINDINGS: Endotracheal tube 5.9 cm from the carina. Patient is post median sternotomy. Heart at the upper limits normal in size. Right greater than left diffuse opacities, primarily perihilar. Suspect small pleural effusions and fluid in the right minor fissure. No evidence of pneumothorax. IMPRESSION: 1. Endotracheal tube 5.9 cm from the carina. 2. Diffuse right greater than left opacities, may be pulmonary edema, aspiration or pneumonia. Suspect small pleural effusions. Electronically Signed   By: Jeb Levering M.D.   On: 08/31/2015 00:38   Dg Knee Left Port  08/31/2015  CLINICAL DATA:  Left knee swelling and tenderness. Status post left knee replacement. EXAM: PORTABLE LEFT KNEE - 1-2 VIEW COMPARISON:  07/27/2015 FINDINGS: No fracture. No bone lesion. Femoral tibial prosthetic components appear well seated and aligned without evidence of loosening. There is a large joint effusion. Subcutaneous edema is seen diffusely. IMPRESSION: 1. No fracture or bone lesion. 2. No evidence of prosthetic loosening. 3. Large joint effusion and nonspecific soft tissue edema. Electronically Signed   By: Lajean Manes M.D.   On: 08/31/2015 08:23   Dg Abd Portable 1v  08/31/2015  CLINICAL DATA:  Nasogastric tube placement EXAM: PORTABLE ABDOMEN - 1 VIEW COMPARISON:  CT abdomen and pelvis September 13, 2010 FINDINGS: Nasogastric tube tip and side port are in the stomach with the nasogastric tube tip near the pylorus. There is diffuse stool throughout colon. There is no bowel dilatation or air-fluid level suggesting obstruction. No free air. IMPRESSION: Nasogastric tube tip and side port in stomach. Overall bowel gas pattern unremarkable. Diffuse stool noted in colon. Electronically Signed   By:  Lowella Grip III M.D.  On: 08/31/2015 08:23   I have personally reviewed and evaluated these images and lab results as part of my medical decision-making.   EKG Interpretation   Date/Time:  Wednesday Aug 31 2015 00:26:18 EDT Ventricular Rate:  126 PR Interval:  162 QRS Duration: 104 QT Interval:  305 QTC Calculation: 441 R Axis:   63 Text Interpretation:  Sinus tachycardia Ventricular premature complex  Borderline repolarization abnormality No significant change was found  Confirmed by Wyvonnia Dusky  MD, [place] ([id number]) on 08/31/2015 1:20:43 AM      MDM   Final diagnoses:  Acute respiratory failure with hypoxia ([place])  HCAP (healthcare-associated pneumonia)   Level 5 caveat for acuity of condition.  Acute onset of SOB tonight.  In extremis, hypoxic on C Pap with diffuse crackles throughout. Tachycardic. Denies chest pain. History of left total knee replacement about one month ago.  EKG does not show any ST elevation. Patient is diaphoretic and in respiratory distress. He is hypoxic on C Pap. He is intubated on arrival.  X-ray shows asymmetric edema right greater than left. Concern for possible pneumonia versus edema  Labs, cultures, antibiotics, attempt small dose lasix.  Lactate 2.6. Hypercarbia on ABG, rate increased. INR 3.75, doubt PE.  Transient hypotension after propofol.  IVF given, propofol stopped. SBP now 110-120.  Admission dw Dr. Rolla Etienne who accepts patient to [facility] ICU.  Family updated. Appears stable for transfer.  BP has stabilized.  CRITICAL CARE Performed by: Ezequiel Essex Total critical care time: 24minutes Critical care time was exclusive of separately billable procedures and treating other patients. Critical care was necessary to treat or prevent imminent or life-threatening deterioration. Critical care was time spent personally by me on the following activities: development of treatment plan with patient and/or surrogate as well as nursing,  discussions with consultants, evaluation of patient's response to treatment, examination of patient, obtaining history from patient or surrogate, ordering and performing treatments and interventions, ordering and review of laboratory studies, ordering and review of radiographic studies, pulse oximetry and re-evaluation of patient's condition.    I personally performed the services described in this documentation, which was scribed in my presence. The recorded information has been reviewed and is accurate.   Ezequiel Essex, MD 08/31/15 1018

## 2015-08-31 NOTE — Progress Notes (Signed)
  Echocardiogram 2D Echocardiogram has been performed.  Benjamin Cordova 08/31/2015, 3:39 PM

## 2015-09-01 ENCOUNTER — Encounter (HOSPITAL_COMMUNITY): Payer: Self-pay | Admitting: General Practice

## 2015-09-01 ENCOUNTER — Inpatient Hospital Stay (HOSPITAL_COMMUNITY): Payer: Commercial Managed Care - HMO

## 2015-09-01 DIAGNOSIS — Z452 Encounter for adjustment and management of vascular access device: Secondary | ICD-10-CM

## 2015-09-01 DIAGNOSIS — J9601 Acute respiratory failure with hypoxia: Secondary | ICD-10-CM

## 2015-09-01 DIAGNOSIS — I509 Heart failure, unspecified: Secondary | ICD-10-CM

## 2015-09-01 DIAGNOSIS — E785 Hyperlipidemia, unspecified: Secondary | ICD-10-CM

## 2015-09-01 DIAGNOSIS — M25461 Effusion, right knee: Secondary | ICD-10-CM

## 2015-09-01 DIAGNOSIS — I5041 Acute combined systolic (congestive) and diastolic (congestive) heart failure: Secondary | ICD-10-CM

## 2015-09-01 DIAGNOSIS — I5020 Unspecified systolic (congestive) heart failure: Secondary | ICD-10-CM

## 2015-09-01 LAB — CBC
HCT: 28.4 % — ABNORMAL LOW (ref 39.0–52.0)
HEMOGLOBIN: 8.7 g/dL — AB (ref 13.0–17.0)
MCH: 28.7 pg (ref 26.0–34.0)
MCHC: 30.6 g/dL (ref 30.0–36.0)
MCV: 93.7 fL (ref 78.0–100.0)
PLATELETS: 129 10*3/uL — AB (ref 150–400)
RBC: 3.03 MIL/uL — ABNORMAL LOW (ref 4.22–5.81)
RDW: 14.9 % (ref 11.5–15.5)
WBC: 5.8 10*3/uL (ref 4.0–10.5)

## 2015-09-01 LAB — COMPREHENSIVE METABOLIC PANEL
ALBUMIN: 2.7 g/dL — AB (ref 3.5–5.0)
ALK PHOS: 50 U/L (ref 38–126)
ALT: 13 U/L — AB (ref 17–63)
AST: 17 U/L (ref 15–41)
Anion gap: 5 (ref 5–15)
BUN: 25 mg/dL — AB (ref 6–20)
CALCIUM: 8.3 mg/dL — AB (ref 8.9–10.3)
CO2: 28 mmol/L (ref 22–32)
CREATININE: 0.71 mg/dL (ref 0.61–1.24)
Chloride: 108 mmol/L (ref 101–111)
GFR calc Af Amer: >60 mL/min (ref >60)
GFR calc non Af Amer: >60 mL/min (ref >60)
GLUCOSE: 120 mg/dL — AB (ref 65–99)
Potassium: 3.8 mmol/L (ref 3.5–5.1)
SODIUM: 141 mmol/L (ref 135–145)
Total Bilirubin: 0.6 mg/dL (ref 0.3–1.2)
Total Protein: 5.1 g/dL — ABNORMAL LOW (ref 6.5–8.1)

## 2015-09-01 LAB — GLUCOSE, CAPILLARY
GLUCOSE-CAPILLARY: 126 mg/dL — AB (ref 65–99)
GLUCOSE-CAPILLARY: 119 mg/dL — AB (ref 65–99)
GLUCOSE-CAPILLARY: 113 mg/dL — AB (ref 65–99)
Glucose-Capillary: 123 mg/dL — ABNORMAL HIGH (ref 65–99)
Glucose-Capillary: 118 mg/dL — ABNORMAL HIGH (ref 65–99)

## 2015-09-01 LAB — MAGNESIUM
Magnesium: 2 mg/dL (ref 1.7–2.4)
Magnesium: 2 mg/dL (ref 1.7–2.4)

## 2015-09-01 LAB — PROTIME-INR
INR: 3.41 — ABNORMAL HIGH (ref 0.00–1.49)
PROTHROMBIN TIME: 33.7 s — AB (ref 11.6–15.2)

## 2015-09-01 LAB — PROCALCITONIN: Procalcitonin: 0.29 ng/mL

## 2015-09-01 LAB — PHOSPHORUS
PHOSPHORUS: 2.3 mg/dL — AB (ref 2.5–4.6)
Phosphorus: 2.2 mg/dL — ABNORMAL LOW (ref 2.5–4.6)

## 2015-09-01 MED ORDER — OXYCODONE-ACETAMINOPHEN 5-325 MG PO TABS
1.0000 | ORAL_TABLET | Freq: Four times a day (QID) | ORAL | Status: DC | PRN
  Administered 2015-09-01 – 2015-09-06 (×16): 1 via ORAL
  Filled 2015-09-01 (×16): qty 1

## 2015-09-01 MED ORDER — HYDROCORTISONE NA SUCCINATE PF 100 MG IJ SOLR
50.0000 mg | Freq: Four times a day (QID) | INTRAMUSCULAR | Status: DC
  Administered 2015-09-01 – 2015-09-02 (×6): 50 mg via INTRAVENOUS
  Filled 2015-09-01 (×6): qty 2

## 2015-09-01 MED ORDER — INSULIN ASPART 100 UNIT/ML ~~LOC~~ SOLN
0.0000 [IU] | Freq: Three times a day (TID) | SUBCUTANEOUS | Status: DC
  Administered 2015-09-02 (×2): 2 [IU] via SUBCUTANEOUS

## 2015-09-01 MED ORDER — DOCUSATE SODIUM 50 MG/5ML PO LIQD: 100.0000 mg | Freq: Two times a day (BID) | ORAL | Status: DC | PRN

## 2015-09-01 MED ORDER — ATORVASTATIN CALCIUM 80 MG PO TABS
80.0000 mg | ORAL_TABLET | Freq: Every day | ORAL | Status: DC
  Administered 2015-09-02 – 2015-09-05 (×4): 80 mg via ORAL
  Filled 2015-09-01 (×4): qty 1

## 2015-09-01 MED FILL — Perflutren Lipid Microsphere IV Susp 1.1 MG/ML: INTRAVENOUS | Qty: 10 | Status: AC

## 2015-09-01 NOTE — Consult Note (Signed)
ANTICOAGULATION CONSULT NOTE - Initial Consult  Pharmacy Consult for Coumadin Indication: hx TIA/CVA, new apical thrombus  No Known Allergies  Patient Measurements: Height: 5\' 11"  (180.3 cm) Weight: 238 lb 1.6 oz (108 kg) IBW/kg (Calculated) : 75.3  Vital Signs: Temp: 99.4 F (37.4 C) (06/01 1517) Temp Source: Oral (06/01 1517) BP: 109/59 mmHg (06/01 1300) Pulse Rate: 97 (06/01 1300)  Labs:  Recent Labs  08/31/15 0055 08/31/15 0058 08/31/15 1109 08/31/15 1755 09/01/15 0340  HGB 13.6 12.7*  --   --  8.7*  HCT 40.0 40.3  --   --  28.4*  PLT  --  271  --   --  129*  LABPROT  --  36.2*  --   --  33.7*  INR  --  3.75*  --   --  3.41*  CREATININE 1.20 1.21  --   --  0.71  TROPONINI  --  0.03 0.16* 0.11*  --     Estimated Creatinine Clearance: 95.2 mL/min (by C-G formula based on Cr of 0.71).   Medical History: Past Medical History  Diagnosis Date  . Coronary artery disease     Multivessel s/p CABG [facility] at [facility]  . Essential hypertension   . Myocardial infarction ([place]) 1980  . Arthritis   . Hematuria   . Bladder infection, acute   . Peripheral vascular disease ([place])   . 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 TIA (transient ischemic attack)     Chronic coumadin  . Stroke ([place])   . Shortness of breath dyspnea   . Pneumonia   . Headache    Assessment: 80yom on coumadin pta for hx TIA/CVA, admitted with respiratory failure and subsequently intubated. INR 3.75 on admission and coumadin held. ECHO done 5/31 revealed possible new apical thrombus. INR down to 3.41 today and coumadin ordered to continue. LFTs wnl. Hgb/plts with significant drop overnight (12.7>8.7 and 271>129) - will watch closely. No drug interactions noted.  Appears home dose is 10mg  daily.   Goal of Therapy:  INR 2-3 Monitor platelets by anticoagulation protocol: Yes   Plan:  1) Hold coumadin tonight 2) Daily INR  Deboraha Sprang 09/01/2015,3:25 PM

## 2015-09-01 NOTE — Progress Notes (Signed)
[facility] Progress Note Patient Name: Benjamin Cordova DOB: 12-Dec-1934 MRN: [id number]   Date of Service  09/01/2015  HPI/Events of Note    eICU Interventions  roxicet ordered for knee pain     Intervention Category Intermediate Interventions: Pain - evaluation and management  Arrietty Dercole S. 09/01/2015, 10:06 PM

## 2015-09-01 NOTE — Clinical Documentation Improvement (Signed)
Internal Medicine  Was CHF ruled in or out ( noted on H + P) ? If yes,  please document diagnosis. Thank you    Acuity - Acute, Chronic, Acute on Chronic   Type - Systolic, Diastolic, Systolic and Diastolic  Other  Clinically Undetermined   Document any associated diagnoses/conditions   Supporting Information: BNP 349.Marland KitchenECHO done..   Treatment: IV Lasix given   Evaluation:    Telemetry    Please exercise your independent, professional judgment when responding. A specific answer is not anticipated or expected.   Thank You,  [place] [phone]

## 2015-09-01 NOTE — Care Management Note (Signed)
Case Management Note  Patient Details  Name: Benjamin Cordova MRN: [id number] Date of Birth: Nov 15, 1934  Subjective/Objective:   Pt admitted with resp failure                 Action/Plan: Pt recently discharged home from SNF from past admit.  CM ordered PT/OT consult   Expected Discharge Date:                  Expected Discharge Plan:     In-House Referral:     Discharge planning Services  CM Consult  Post Acute Care Choice:    Choice offered to:     DME Arranged:    DME Agency:     HH Arranged:    HH Agency:     Status of Service:  In process, will continue to follow  Medicare Important Message Given:    Date Medicare IM Given:    Medicare IM give by:    Date Additional Medicare IM Given:    Additional Medicare Important Message give by:     If discussed at [place] of Stay Meetings, dates discussed:    Additional Comments:  Maryclare Labrador, RN 09/01/2015, 3:42 PM

## 2015-09-01 NOTE — Procedures (Signed)
Extubation Procedure Note  Patient Details:   Name: ROSE BAIRE DOB: 09-Apr-1934 MRN: [id number]   Airway Documentation:     Evaluation  O2 sats: stable throughout Complications: No apparent complications Patient did tolerate procedure well. Bilateral Breath Sounds: Clear, Diminished   Yes  Pt tolerated wean, positive for cuff leak, extubated to 5L [place]. No stridor or dyspnea noted after extubation. RT will continue to monitor.  Mariam Dollar 09/01/2015, 12:03 PM

## 2015-09-01 NOTE — Progress Notes (Signed)
PULMONARY / CRITICAL CARE MEDICINE   Name: Benjamin Cordova MRN: [id number] DOB: Apr 26, 1934    ADMISSION DATE:  08/31/2015  REFERRING MD:  Dr. Wyvonnia Dusky  CHIEF COMPLAINT:  Short of breath  HISTORY OF PRESENT ILLNESS:   Hx from chart.  81 yo male had Lt TKR in April 2017.  He had redo CABG in July 2016.  He was in short term rehab, and was discharged.  He developed progressive shortness of breath and cough.  He went to [facility].  He was found to have gurgling respirations, hypoxia.  CXR showed infiltrate, and ABG showed hypercapnia.  He was intubated.  He was started on IV fluids and Abx for HCAP.  He was transferred to [facility].  SUBJECTIVE:   calm on vent; no distress on SBT. On low dose levophed.  VITAL SIGNS: BP 113/65 mmHg  Pulse 98  Temp(Src) 99 F (37.2 C) (Oral)  Resp 19  Ht 5\' 11"  (1.803 m)  Wt 238 lb 1.6 oz (108 kg)  BMI 33.22 kg/m2  SpO2 99%  HEMODYNAMICS: CVP:  [6 mmHg-12 mmHg] 12 mmHg  VENTILATOR SETTINGS: Vent Mode:  [-] PSV FiO2 (%):  [40 %] 40 % Set Rate:  [16 bmp] 16 bmp Vt Set:  [600 mL] 600 mL PEEP:  [5 cmH20] 5 cmH20 Pressure Support:  [5 cmH20] 5 cmH20 Plateau Pressure:  [20 cmH20-23 cmH20] 20 cmH20  INTAKE / OUTPUT:  Intake/Output Summary (Last 24 hours) at 09/01/15 1149 Last data filed at 09/01/15 1000  Gross per 24 hour  Intake 1805.89 ml  Output   1540 ml  Net 265.89 ml    PHYSICAL EXAMINATION: General:  Ill appearing, now awake, follows commands. No distress.  Neuro:  RASS -1, fc HEENT:  Pupils pinpoint, ETT in place Cardiovascular:  s1 s2 Irregular, no murmur Lungs:clear w/ equal chest rise. Excellent Vt on SBT Abdomen:  Soft, non tender, +bs Musculoskeletal:  Mild swelling Lt knee >> no erythema or tenderness Skin:  No rashes  LABS:  BMET  Recent Labs Lab 08/31/15 0055 08/31/15 0058 09/01/15 0340  NA 142 138 141  K 4.4 4.2 3.8  CL 104 106 108  CO2  --  25 28  BUN 31* 32* 25*  CREATININE 1.20 1.21 0.71  GLUCOSE 215* 217* 120*     Electrolytes  Recent Labs Lab 08/31/15 0058 08/31/15 1109 08/31/15 1822 09/01/15 0340  CALCIUM 8.6*  --   --  8.3*  MG  --  2.0 2.0 2.0  PHOS  --  3.0 2.6 2.2*    CBC  Recent Labs Lab 08/31/15 0055 08/31/15 0058 09/01/15 0340  WBC  --  13.3* 5.8  HGB 13.6 12.7* 8.7*  HCT 40.0 40.3 28.4*  PLT  --  271 129*    Coag's  Recent Labs Lab 08/31/15 0058 09/01/15 0340  INR 3.75* 3.41*    Sepsis Markers  Recent Labs Lab 08/31/15 0055 08/31/15 1109 08/31/15 1112 09/01/15 0340  LATICACIDVEN 2.67*  --  1.4  --   PROCALCITON  --  0.40  --  0.29    ABG  Recent Labs Lab 08/31/15 0031 08/31/15 0646  PHART 7.215* 7.357  PCO2ART 60.3* 42.3  PO2ART 137* 79.0*    Liver Enzymes  Recent Labs Lab 08/31/15 0058 09/01/15 0340  AST 23 17  ALT 16* 13*  ALKPHOS 74 50  BILITOT 0.5 0.6  ALBUMIN 4.1 2.7*    Cardiac Enzymes  Recent Labs Lab 08/31/15 0058 08/31/15 1109 08/31/15 1755  TROPONINI 0.03 0.16* 0.11*    Glucose  Recent Labs Lab 08/31/15 1143 08/31/15 1557 08/31/15 1950 08/31/15 2320 09/01/15 0335 09/01/15 0810  GLUCAP 127* 140* 133* 123* 118* 126*    Imaging Dg Chest Port 1 View  09/01/2015  CLINICAL DATA: Pneumonia. EXAM: PORTABLE CHEST 1 VIEW COMPARISON:  08/31/2015. FINDINGS: Endotracheal tube, NG tube, left IJ line stable position. Prior CABG. Stable cardiomegaly. Continued interval clearing of bilateral pulmonary infiltrates/edema. Small left pleural effusion. No pneumothorax. IMPRESSION: 1. Lines and tubes in stable position. 2. Prior CABG. Continued interval clearing of bilateral pulmonary infiltrates consistent with clearing pulmonary edema. Small left pleural effusion. Electronically Signed   By: Marcello Moores  Register   On: 09/01/2015 07:26   Marked improvement in airspace disease   STUDIES:   CULTURES: 5/31 urine >> 5/31 respiratory culture: few gpc pairs and gm variable rods  5/31 Blood >>  5/31 legionella Ag >> 5/31  pneumococcal Ag >>  ANTIBIOTICS: 5/31 Vancomycin >>6/1 5/31 Zosyn >> 6/1  SIGNIFICANT EVENTS: 5/31 Transfer from [facility] ER to [facility] 5/31- off pressors  LINES/TUBES: 5/31 ETT >> 5/31 left IJ CVL >>   DISCUSSION: 81 yo male with recent Lt TKR and rehab stay presents with cough, dyspnea and acute hypoxic/hypercapnic respiratory failure d/t decompensated HF. Has improved w/ diuresis & positive pressure. Will extubate. DC abx. Continue medical management. Cards to see for new heart failure   He has hx of CAD s/p CABG, A fib on coumadin, CVA, HTN, Polio.  ASSESSMENT / PLAN:  PULMONARY A: Acute hypoxic/hypercapnic respiratory failure in setting of decompensated Heart failure and Pulmonary Edema  --> passed SBT, Excellent Vt. CXR remarkably improved w/ diuresis and + pressure  P: Extubate Keep even to negative volume status  Wean FIO2 Aspiration precautions   CARDIOVASCULAR A:  Unclear if this is sepsis Hx of CAD, A fib, HTN, HLD. Acute systolic HF (EF [id number]) Decompensated diastolic HF (grade II) Possible apical thrombus P:  Continue lipitor Pressors to keep MAP > 60 Continue IV fluids - to kvo Cont coumadin per pharmacy  Stress dose steroids as still pressor dependent and cortisol 7.1 Cards to see for new heart failure   RENAL A:   No acute issues. P:   Monitor renal fx, urine outpt Chem this am about to be sent will follow kvo  GASTROINTESTINAL A:   Nutrition. P:   NPO pending extubation  Follow BM Protonix for SUP  HEMATOLOGIC A:   No acute issues. Chronic anticoagulation  P:  F/u CBC SCDs Trend INR   INFECTIOUS A:   Septic shock from HCAP unclear to me P:   Day 1 vancomycin, zosyn -d/c follow culture   ENDOCRINE A:   Hyperglycemia. Rel AI P:   SSI Stress dose steroids  NEUROLOGIC A:   Acute metabolic encephalopathy P:   RASS goal -1 Dc sedation pending extubation  Avoid benzo as able  Erick Colace ACNP-BC [place] Pager # [phone] OR # [phone] if no answer  STAFF NOTE: I, Merrie Roof, MD FACP have personally reviewed patient's available data, including medical history, events of note, physical examination and test results as part of my evaluation. I have discussed with resident/NP and other care providers such as pharmacist, RN and RRT. In addition, I personally evaluated patient and elicited key findings of: rapid resolution, PCT low trend, clinically resolved fast c/w failure, Echo noted, needs cards assessment with changes and regional wall changes, dc all abx and observe, weaning well, cpap  5 ps5, goal 30 min , assess rsbi, extubated, keep on tele , chem in am ,lasix to even to neg, mobilize, spuutm multiple org, contam likely The patient is critically ill with multiple organ systems failure and requires high complexity decision making for assessment and support, frequent evaluation and titration of therapies, application of advanced monitoring technologies and extensive interpretation of multiple databases.   Critical Care Time devoted to patient care services described in this note is30 Minutes. This time reflects time of care of this signee: Merrie Roof, MD FACP. This critical care time does not reflect procedure time, or teaching time or supervisory time of PA/NP/Med student/Med Resident etc but could involve care discussion time. Rest per NP/medical resident whose note is outlined above and that I agree with   Lavon Paganini. Titus Mould, MD, [place] Pgr: [place] [facility] 09/01/2015 12:54 PM  \]

## 2015-09-01 NOTE — Consult Note (Signed)
Cardiology Consult    Patient ID: Benjamin Cordova MRN: [id number], DOB/AGE: 10-01-1934   Admit date: 08/31/2015 Date of Consult: 09/01/2015  Primary Physician: Benjamin Neighbors, MD Primary Cardiologist: Dr. Domenic Cordova Requesting Provider: Dr. Gordy Cordova   Patient Profile    81 yo male with PMH of CAD (s/p redo CABG x2 with left mammary to LAD and left radial to OM 2 10/2014)/HTN/PVD/Stroke and s/p left knee replacement 07/2015 who presented to [facility] with dyspnea and LEE requiring intubation and transferred to [facility].   Past Medical History   Past Medical History  Diagnosis Date  . Coronary artery disease     Multivessel s/p CABG 1981 at [facility]  . Essential hypertension   . Myocardial infarction ([place]) 1980  . Arthritis   . Hematuria   . Bladder infection, acute   . Peripheral vascular disease ([place])   . Polio osteopathy of lower leg ([place]) Age 19    Affected right leg  . TIA (transient ischemic attack)     Chronic coumadin  . Stroke ([place])   . Shortness of breath dyspnea   . Pneumonia   . Headache     Past Surgical History  Procedure Laterality Date  . Tonsillectomy  age 61    [facility]  . Foot surgery  1954    [facility], muscle implantation  . Coronary artery bypass graft  1981    [facility]  . Cystoscopy with injection  10/10/2010    Procedure: CYSTOSCOPY WITH INJECTION;  Surgeon: Benjamin Cordova;  Location: [facility];  Service: Urology;  Laterality: N/A;  with retrograde urethrogram  . Cardiac catheterization N/A 10/05/2014    Procedure: Left Heart Cath and Coronary Angiography;  Surgeon: Benjamin M Martinique, MD;  Location: [place] CV LAB;  Service: Cardiovascular;  Laterality: N/A;  . Coronary artery bypass graft N/A 10/08/2014    Procedure: REDO CORONARY ARTERY BYPASS GRAFTING (CABG) Times Two Grafts using Left Internal Mammary and Left Radial Artery;  Surgeon: Benjamin Nakayama, MD;  Location: [place];  Service: Open Heart Surgery;  Laterality: N/A;  . Tee without cardioversion N/A 10/08/2014      Procedure: TRANSESOPHAGEAL ECHOCARDIOGRAM (TEE);  Surgeon: Benjamin Nakayama, MD;  Location: [place];  Service: Open Heart Surgery;  Laterality: N/A;  . Radial artery harvest Left 10/08/2014    Procedure: Left RADIAL ARTERY HARVEST;  Surgeon: Benjamin Nakayama, MD;  Location: [place];  Service: Open Heart Surgery;  Laterality: Left;  . Total knee arthroplasty Left 07/27/2015    Procedure: TOTAL LEFT KNEE ARTHROPLASTY;  Surgeon: Benjamin Lights, MD;  Location: [place];  Service: Orthopedics;  Laterality: Left;     Allergies  No Known Allergies  History of Present Illness    [name] is a 81 yo male who is normally seen by Dr. Domenic Cordova with PMH of CAD (s/p redo CABG x2 with left mammary to LAD and left radial to OM 2 10/2014)/HTN/PVD/Stroke, s/p left knee replacement 07/2015. He was last seen in the office 06/2015 where at that time he was preparing to undergo left knee replacement, only plans for medication changes including holding his Coumadin 4 days prior to procedure. He was 8 months s/p redo CABG at that time, and recovering well. 2D echo in 10/2014 showed an EF of 50-55% with akinesis of the mid-apicalinferior myocardium and G1DD.  States he had been recovering well following his left knee replacement, and using a walker to get around his home. As his baseline he cares for  himself without normally having any chest pain or dyspnea. Reports yesterday, he had been leaning forward placing ice on and off his knee, when started to develop dyspnea. States this was a sudden onset, and proceeded to tell his wife to call EMS because her felt like he was "going to die". He denies having any chest pain during this evening, palpitations or light-headedness. He was transported to [facility] and placed on C pap for his respiratory distress and reported gurgling respirations.   In the ED he required intubation and pressors, and chest x-ray concerning for R/L opacities for PNA and edema. Labs showed a BNP 349, WBC  13.3, Cr 1.2, Lactic Acid 1.4 and pH 7.2, CO2 60. Troponins were cycled with mild, flat elevation. He was started on IV antibiotics and PCCM called for transport and admission to [facility] ICU. He was noted to be net neg 2L without the initial use of lasix, but 82m lasix was added to regimen today. Overnight his respiratory status has improved and on assessment he was off the vent on [place] at 5L. Repeat 2D echo was obtained during this admission which shows slight decrease EF of 40-45%, akinesis of the entire apical myocardium, apicalanterior and inferior myocardium, G2DD and possible apical thrombus.   Cardiology has been consulted related to acute CHF.   Inpatient Medications    . antiseptic oral rinse  7 mL Mouth Rinse QID  . atorvastatin  40 mg Oral Daily  . chlorhexidine gluconate (SAGE KIT)  15 mL Mouth Rinse BID  . furosemide  40 mg Intravenous Once  . hydrocortisone sod succinate (SOLU-CORTEF) inj  50 mg Intravenous Q6H  . insulin aspart  0-15 Units Subcutaneous Q4H    Family History    Family History  Problem Relation Age of Onset  . Anesthesia problems Neg Hx   . Hypotension Neg Hx   . Malignant hyperthermia Neg Hx   . Pseudochol deficiency Neg Hx     Social History    Social History   Social History  . Marital Status: Married    Spouse Name: N/A  . Number of Children: N/A  . Years of Education: N/A   Occupational History  . Not on file.   Social History Main Topics  . Smoking status: Former Smoker -- 1.00 packs/day for 28 years    Types: Cigarettes    Quit date: 12/02/1978  . Smokeless tobacco: Current User    Types: Snuff  . Alcohol Use: No  . Drug Use: No  . Sexual Activity: Not on file   Other Topics Concern  . Not on file   Social History Narrative     Review of Systems    General:  No chills, fever, night sweats or weight changes.  Cardiovascular:  No chest pain, + dyspnea on exertion, + edema, orthopnea, palpitations, paroxysmal nocturnal  dyspnea. Dermatological: No rash, lesions/masses Respiratory: No cough, + dyspnea, + cough Urologic: No hematuria, dysuria Abdominal:   No nausea, vomiting, diarrhea, bright red blood per rectum, melena, or hematemesis Neurologic:  No visual changes, wkns, changes in mental status. All other systems reviewed and are otherwise negative except as noted above.  Physical Exam    Blood pressure 109/59, pulse 97, temperature 99 F (37.2 C), temperature source Oral, resp. rate 21, height _0  (1.803 m), weight 238 lb 1.6 oz (108 kg), SpO2 99 %.  General: Pleasant older male, NAD Psych: Normal affect. Neuro: Alert and oriented X 3. Moves all extremities spontaneously. HEENT: Normal  Neck: Supple without bruits or JVD. Lungs:  Resp regular and unlabored, CTA. Heart: RRR no s3, s4, or murmurs. Abdomen: Soft, non-tender, non-distended, BS + x 4.  Extremities: No clubbing, cyanosis or 2+ edema to bilateral U&LE L>R. Healed incision noted to left knee. DP/PT/Radials 2+ and equal bilaterally.  Labs    Troponin [facility] of Care Test)  Recent Labs  08/31/15 0059  TROPIPOC 0.02    Recent Labs  08/31/15 0058 08/31/15 1109 08/31/15 1755  TROPONINI 0.03 0.16* 0.11*   Lab Results  Component Value Date   WBC 5.8 09/05/2015   HGB 8.7* 2015/09/05   HCT 28.4* 05-Sep-2015   MCV 93.7 September 05, 2015   PLT 129* 09/05/2015    Recent Labs Lab 09-05-2015 0340  NA 141  K 3.8  CL 108  CO2 28  BUN 25*  CREATININE 0.71  CALCIUM 8.3*  PROT 5.1*  BILITOT 0.6  ALKPHOS 50  ALT 13*  AST 17  GLUCOSE 120*   Lab Results  Component Value Date   CHOL 118* 03/05/2015   HDL 39* 03/05/2015   LDLCALC 58 03/05/2015   TRIG 104 03/05/2015     Radiology Studies    Dg Chest Port 1 View  Sep 05, 2015  CLINICAL DATA: Pneumonia. EXAM: PORTABLE CHEST 1 VIEW COMPARISON:  08/31/2015. FINDINGS: Endotracheal tube, NG tube, left IJ line stable position. Prior CABG. Stable cardiomegaly. Continued interval clearing  of bilateral pulmonary infiltrates/edema. Small left pleural effusion. No pneumothorax. IMPRESSION: 1. Lines and tubes in stable position. 2. Prior CABG. Continued interval clearing of bilateral pulmonary infiltrates consistent with clearing pulmonary edema. Small left pleural effusion. Electronically Signed   By: Marcello Moores  Register   On: 09/05/2015 07:26   Dg Chest Port 1 View  08/31/2015  CLINICAL DATA:  Hypoxia.  Central catheter placed EXAM: PORTABLE CHEST 1 VIEW COMPARISON:  Study obtained earlier in the day FINDINGS: Endotracheal tube tip is 5.8 cm above carina. Central catheter tip is in the superior vena cava near the cavoatrial junction. Nasogastric tube tip and side port are below the diaphragm. No pneumothorax. There is less airspace consolidation compared to earlier in the day. There is patchy airspace disease currently with underlying interstitial edema and bilateral pleural effusions. There is cardiomegaly with mild pulmonary venous hypertension. No new opacity. No adenopathy evident. IMPRESSION: Tube and catheter positions as described without pneumothorax. Evidence a degree of congestive heart failure. Considerably less alveolar consolidation is noted compared to earlier in the day. No new opacity currently. Electronically Signed   By: Lowella Grip III M.D.   On: 08/31/2015 08:00   Dg Chest Portable 1 View  08/31/2015  CLINICAL DATA:  Endotracheal tube placement.  Shortness of breath. EXAM: PORTABLE CHEST 1 VIEW COMPARISON:  12/15/2014 FINDINGS: Endotracheal tube 5.9 cm from the carina. Patient is post median sternotomy. Heart at the upper limits normal in size. Right greater than left diffuse opacities, primarily perihilar. Suspect small pleural effusions and fluid in the right minor fissure. No evidence of pneumothorax. IMPRESSION: 1. Endotracheal tube 5.9 cm from the carina. 2. Diffuse right greater than left opacities, may be pulmonary edema, aspiration or pneumonia. Suspect small  pleural effusions. Electronically Signed   By: Jeb Levering M.D.   On: 08/31/2015 00:38   Dg Knee Left Port  08/31/2015  CLINICAL DATA:  Left knee swelling and tenderness. Status post left knee replacement. EXAM: PORTABLE LEFT KNEE - 1-2 VIEW COMPARISON:  07/27/2015 FINDINGS: No fracture. No bone lesion. Femoral tibial prosthetic components appear well  seated and aligned without evidence of loosening. There is a large joint effusion. Subcutaneous edema is seen diffusely. IMPRESSION: 1. No fracture or bone lesion. 2. No evidence of prosthetic loosening. 3. Large joint effusion and nonspecific soft tissue edema. Electronically Signed   By: Lajean Manes M.D.   On: 08/31/2015 08:23   Dg Abd Portable 1v  08/31/2015  CLINICAL DATA:  Nasogastric tube placement EXAM: PORTABLE ABDOMEN - 1 VIEW COMPARISON:  CT abdomen and pelvis September 13, 2010 FINDINGS: Nasogastric tube tip and side port are in the stomach with the nasogastric tube tip near the pylorus. There is diffuse stool throughout colon. There is no bowel dilatation or air-fluid level suggesting obstruction. No free air. IMPRESSION: Nasogastric tube tip and side port in stomach. Overall bowel gas pattern unremarkable. Diffuse stool noted in colon. Electronically Signed   By: Lowella Grip III M.D.   On: 08/31/2015 08:23    ECG & Cardiac Imaging    Echo: 08/31/2015  Study Conclusions  - Left ventricle: The cavity size was normal. Systolic function was  mildly to moderately reduced. The estimated ejection fraction was  in the range of 40% to 45%. There is akinesis of the entireapical  myocardium. There is akinesis of the apicalanterior and inferior  myocardium. Features are consistent with a pseudonormal left  ventricular filling pattern, with concomitant abnormal relaxation  and increased filling pressure (grade 2 diastolic dysfunction).  Acoustic contrast opacification revealed a possible,  apicalthrombus. - Mitral valve: There  was mild regurgitation. - Pericardium, extracardiac: There was a left pleural effusion.  EKG: 08/31/2015 ST with PVC, No acute ST/T wave abnormalities   Assessment & Plan    1. CHF: Reports sudden onset of dyspnea yesterday after leaning back and forth to ice his knee. States this became severe to the point he told his wife to call EMS for transport. He required c-pap with EMS and was eventually intubated while at [facility] requiring transport to [facility] under PCCM service. BNP on admission 349, and chest x-ray showed edema and possible PNA. Was on pressors initially, and not started lasix but net neg 2L on his own. Lasix 32m IV started back today with good UOP net -2300 since admission.  --Does have 2+ LEE edema on exam, but lungs generally clear.  --Would continue with current lasix dose, daily weights, I&Os. Current Cr 0.71, follow progress. --Reports not being on lasix at home, may need to add this to home regimen closer to discharge  2. CAD s/p redo CABG x2 with left mammary to LAD and left radial to OM 2 10/2014: Reports recovering well post surgery, without reports of chest pain or dyspnea at home with daily activity.  --Continue statin --Was on pressors but have been dc'd, once blood pressure is stable would add back BB and ACE  3. Stroke: Chronically on Coumadin, reports he has been for the 20 or so years. Will add back at this time. Current INR 3.14. Pharmacy to dose.  4. Apical Thrombus: This is newly noted on 2D echo yesterday, has been on coumadin at home previously for stroke. Will continue coumadin for this.   5. CAP: Was treated empirically for this on admission, feel at this time respiratory distress was caused by hypervolemia.    SBarnet Pall NP-C Pager [phone]/04/2015, 1:47 PM As above, patient seen and examined. Briefly he is a 81year old male with past medical history of coronary artery disease status post coronary artery bypassing graft, ischemic  cardiomyopathy, prior stroke  on chronic Coumadin for evaluation of acute combined systolic/diastolic congestive heart failure. Patient had redo coronary artery bypass and graft in July 2016. He had knee replacement in April. He was admitted on May 31 with acute dyspnea. The patient states he was placing ice packs on his knee and suddenly developed dyspnea. His dyspnea progressed and EMS was called. There was no chest pain. He ultimately required intubation. He was placed on antibiotics and diuresed and was extubated today. Cardiology is now asked to evaluate. The patient denies any dyspnea prior to his event leading to admission. He has occasional pedal edema at home. He does not have exertional chest pain. He denies any preceding cough, fever or hemoptysis. Electrocardiogram shows sinus tachycardia with PVCs, nonspecific T-wave changes. Troponin .16. Echocardiogram shows ejection fraction 40-45% with apical wall motion abnormality and apical thrombus.   1 acute combined systolic/diastolic congestive heart failure-I have reviewed his previous echocardiograms. His apical wall motion abnormality was noted previously. He does have reduced LV function and he has 1+ lower extremity edema. Would continue to diurese. Follow renal function. His pressors have been discontinued. We will add lisinopril and beta blockade tomorrow morning as blood pressure allows. His presentation is somewhat concerning for ischemia mediated event. However his electrocardiogram was nondiagnostic and his enzymes are not consistent with an acute coronary syndrome. His wall motion abnormality was documented previously. He also has 1+ edema on examination leading me to believe there is also a component of chronic congestive heart failure.Once he improves we will plan a nuclear study for risk stratification. 2 LV apical thrombus-continue Coumadin. 3 coronary artery disease-continue statin. No aspirin given need for Coumadin. 4 Hyperlipidemia -  given document coronary disease will increase Lipitor to 80 mg daily. Check lipids and liver in 4 weeks. 5 History of CVA-continue Coumadin.  [name]

## 2015-09-02 DIAGNOSIS — J9621 Acute and chronic respiratory failure with hypoxia: Secondary | ICD-10-CM

## 2015-09-02 DIAGNOSIS — J9622 Acute and chronic respiratory failure with hypercapnia: Secondary | ICD-10-CM

## 2015-09-02 LAB — CULTURE, RESPIRATORY W GRAM STAIN: Culture: NORMAL

## 2015-09-02 LAB — COMPREHENSIVE METABOLIC PANEL
ALT: 16 U/L — ABNORMAL LOW (ref 17–63)
ANION GAP: 4 — AB (ref 5–15)
AST: 22 U/L (ref 15–41)
Albumin: 2.9 g/dL — ABNORMAL LOW (ref 3.5–5.0)
Alkaline Phosphatase: 52 U/L (ref 38–126)
BUN: 20 mg/dL (ref 6–20)
CHLORIDE: 107 mmol/L (ref 101–111)
CO2: 28 mmol/L (ref 22–32)
Calcium: 8.8 mg/dL — ABNORMAL LOW (ref 8.9–10.3)
Creatinine, Ser: 0.64 mg/dL (ref 0.61–1.24)
Glucose, Bld: 137 mg/dL — ABNORMAL HIGH (ref 65–99)
POTASSIUM: 4 mmol/L (ref 3.5–5.1)
Sodium: 139 mmol/L (ref 135–145)
TOTAL PROTEIN: 5.6 g/dL — AB (ref 6.5–8.1)
Total Bilirubin: 0.8 mg/dL (ref 0.3–1.2)

## 2015-09-02 LAB — CBC
HCT: 29.1 % — ABNORMAL LOW (ref 39.0–52.0)
Hemoglobin: 9.1 g/dL — ABNORMAL LOW (ref 13.0–17.0)
MCH: 29 pg (ref 26.0–34.0)
MCHC: 31.3 g/dL (ref 30.0–36.0)
MCV: 92.7 fL (ref 78.0–100.0)
PLATELETS: 133 10*3/uL — AB (ref 150–400)
RBC: 3.14 MIL/uL — ABNORMAL LOW (ref 4.22–5.81)
RDW: 14.4 % (ref 11.5–15.5)
WBC: 6.2 10*3/uL (ref 4.0–10.5)

## 2015-09-02 LAB — TSH: TSH: 1.363 u[IU]/mL (ref 0.350–4.500)

## 2015-09-02 LAB — CULTURE, RESPIRATORY

## 2015-09-02 LAB — URINE CULTURE: Culture: >=100000 — AB

## 2015-09-02 LAB — PROTIME-INR
INR: 1.78 — AB (ref 0.00–1.49)
PROTHROMBIN TIME: 20.7 s — AB (ref 11.6–15.2)

## 2015-09-02 LAB — GLUCOSE, CAPILLARY
GLUCOSE-CAPILLARY: 121 mg/dL — AB (ref 65–99)
GLUCOSE-CAPILLARY: 130 mg/dL — AB (ref 65–99)
GLUCOSE-CAPILLARY: 118 mg/dL — AB (ref 65–99)
GLUCOSE-CAPILLARY: 126 mg/dL — AB (ref 65–99)
Glucose-Capillary: 124 mg/dL — ABNORMAL HIGH (ref 65–99)

## 2015-09-02 MED ORDER — FUROSEMIDE 10 MG/ML IJ SOLN
40.0000 mg | Freq: Once | INTRAMUSCULAR | Status: AC
  Administered 2015-09-02: 40 mg via INTRAVENOUS
  Filled 2015-09-02: qty 4

## 2015-09-02 MED ORDER — WARFARIN - PHARMACIST DOSING INPATIENT
Freq: Every day | Status: DC
  Administered 2015-09-03 – 2015-09-06 (×3)

## 2015-09-02 MED ORDER — LISINOPRIL 5 MG PO TABS
2.5000 mg | ORAL_TABLET | Freq: Every day | ORAL | Status: DC
  Administered 2015-09-02 – 2015-09-05 (×4): 2.5 mg via ORAL
  Filled 2015-09-02 (×4): qty 1

## 2015-09-02 MED ORDER — HYDROCORTISONE NA SUCCINATE PF 100 MG IJ SOLR
50.0000 mg | Freq: Two times a day (BID) | INTRAMUSCULAR | Status: DC
  Administered 2015-09-03: 50 mg via INTRAVENOUS
  Filled 2015-09-02: qty 2

## 2015-09-02 MED ORDER — WARFARIN SODIUM 7.5 MG PO TABS
7.5000 mg | ORAL_TABLET | Freq: Once | ORAL | Status: AC
  Administered 2015-09-02: 7.5 mg via ORAL
  Filled 2015-09-02: qty 1

## 2015-09-02 NOTE — Progress Notes (Signed)
PROGRESS NOTE  Benjamin Cordova  BOF:[id number] DOB: 09-02-1934  DOA: 08/31/2015 PCP: Wende Neighbors, MD   Brief Narrative:  81 yo male had Lt TKR in April 2017. He had redo CABG in July 2016. He was in short term rehab, and was discharged. He developed progressive shortness of breath and cough. He went to [facility]. He was found to have gurgling respirations, hypoxia. CXR showed infiltrate, and ABG showed hypercapnia. He was intubated. He was started on IV fluids and Abx for HCAP. He was transferred to [facility] ICU under CCM service. Extubated, off pressors. Stabilized and transferred to floor and [place] service on 6/2. Cardiology consulting.   Assessment & Plan:   Active Problems:   Respiratory failure with hypoxia (HCC)   HCAP (healthcare-associated pneumonia)   Acute respiratory failure with hypoxia (HCC)   Encounter for central line placement   Knee swelling   CHF (congestive heart failure) (HCC)   Acute hypoxic and hypercapnic respiratory failure - Secondary to decompensated CHF - Extubated 6/1. Antibiotics discontinued-not felt to be infectious etiology. Significant improvement with diuresis-continue.  Acute combined systolic and diastolic CHF - S/P VDRF d/t this - 2-D echo 5/31: LVEF 40-45 percent, akinesis of the entire apical myocardium, apical anterior and inferior myocardium, grade 2 diastolic dysfunction and apical thrombus - Diuresing with IV Lasix and -3.191 L since admission - Lisinopril 2.5 MG added. Cardiology follow-up appreciated. His presentation is somewhat concerning for ischemic mediated event. Plan for nuclear stress test 6/3 to exclude ischemia.  A. Fib/apical thrombus - Continue Coumadin per pharmacy.  Essential hypertension - Mildly uncontrolled at times. Low-dose lisinopril started today. Considering beta blockers in a.m. if blood pressure tolerates.  Hyperlipidemia - Lipitor increased to 80 MG daily. Follow-up LFTs and fasting lipids in 4 weeks.  Shock -  Unclear etiology. Some concern for septic shock initially from HCAP. Empirically treated with vancomycin, Zosyn, pressors and stress dose steroids.  - Rapid resolution, PCT low trend, clinically resolved fast consistent with heart failure, abnormal echo noted >Finally felt not infectious etiology and antibiotics were discontinued. Off of pressors. Taper down steroids to DC. - Blood cultures 2: Negative to date. Urine culture: Enterococcus but urine microscopy not impressive for UTI and in the absence of symptoms, we'll not initiate antibiotics for this.  Hyperglycemia - Good inpatient control  Acute metabolic encephalopathy - Resolved  History of CVA - Continue Coumadin.   CAD, s/p CABG - Continue statin. No aspirin given need for Coumadin.  Status post left TKR April 2017 - States that he has follow-up appointment with orthopedics Tuesday next week. PT evaluation. Ambulated with the help of walker at home.  Anemia and thrombocytopenia - Stable.   DVT prophylaxis: Coumadin per pharmacy. SCDs. Code Status: Full Family Communication: Discussed with patient. No family at bedside.  Disposition Plan: DC home when medically stable, possibly early next week.   Consultants:   CCM-signed off   Cardiology  Procedures:  5/31 ETT >> 6/1 5/31 left IJ CVL >> DC'd  Antimicrobials:  5/31 Vancomycin >>6/1 5/31 Zosyn >> 6/1   Subjective: Feels better. No chest pain or dyspnea reported. Leg swelling improving. No other complaints.  Objective:  Filed Vitals:   09/01/15 2242 09/02/15 0508 09/02/15 0940 09/02/15 1455  BP: 156/65 117/74 129/74 140/70  Pulse: 110 96 105 93  Temp: 98.3 F (36.8 C) 98.2 F (36.8 C)  97.4 F (36.3 C)  TempSrc: Oral Oral  Oral  Resp: _0 Height:  Weight:  109.2 kg (240 lb 11.9 oz)    SpO2: 97% 94%  96%    Intake/Output Summary (Last 24 hours) at 09/02/15 1711 Last data filed at 09/02/15 1656  Gross per 24 hour  Intake   1230 ml    Output   1800 ml  Net   -570 ml   Filed Weights   08/31/15 0530 09/01/15 0500 09/02/15 0508  Weight: 107.2 kg (236 lb 5.3 oz) 108 kg (238 lb 1.6 oz) 109.2 kg (240 lb 11.9 oz)    Examination:  General exam: Pleasant elderly male lying comfortably supine in bed.  Respiratory system: clear to auscultation anteriorly. Reduced breath sounds posteriorly with basal crackles. Respiratory effort normal. Cardiovascular system: S1 & S2 heard, RRR. No JVD, murmurs, rubs, gallops or clicks.  1+ pitting pedal edema. Gastrointestinal system: Abdomen is nondistended, soft and nontender. No organomegaly or masses felt. Normal bowel sounds heard. Central nervous system: Alert and oriented. No focal neurological deficits. Extremities: Symmetric 5 x 5 power. left knee TKR surgery site healing well without acute findings.  Skin: No rashes, lesions or ulcers Psychiatry: Judgement and insight appear normal. Mood & affect appropriate.     Data Reviewed: I have personally reviewed following labs and imaging studies  CBC:  Recent Labs Lab 08/31/15 0055 08/31/15 0058 09/01/15 0340 09/02/15 0621  WBC  --  13.3* 5.8 6.2  NEUTROABS  --  9.7*  --   --   HGB 13.6 12.7* 8.7* 9.1*  HCT 40.0 40.3 28.4* 29.1*  MCV  --  94.4 93.7 92.7  PLT  --  271 129* 185*   Basic Metabolic Panel:  Recent Labs Lab 08/31/15 0055 08/31/15 0058 08/31/15 1109 08/31/15 1822 09/01/15 0340 09/01/15 1645 09/02/15 0621  NA 142 138  --   --  141  --  139  K 4.4 4.2  --   --  3.8  --  4.0  CL 104 106  --   --  108  --  107  CO2  --  25  --   --  28  --  28  GLUCOSE 215* 217*  --   --  120*  --  137*  BUN 31* 32*  --   --  25*  --  20  CREATININE 1.20 1.21  --   --  0.71  --  0.64  CALCIUM  --  8.6*  --   --  8.3*  --  8.8*  MG  --   --  2.0 2.0 2.0 2.0  --   PHOS  --   --  3.0 2.6 2.2* 2.3*  --    GFR: Estimated Creatinine Clearance: 95.7 mL/min (by C-G formula based on Cr of 0.64). Liver Function Tests:  Recent  Labs Lab 08/31/15 0058 09/01/15 0340 09/02/15 0621  AST _0 ALT 16* 13* 16*  ALKPHOS 74 50 52  BILITOT 0.5 0.6 0.8  PROT 7.0 5.1* 5.6*  ALBUMIN 4.1 2.7* 2.9*   No results for input(s): LIPASE, AMYLASE in the last 168 hours. No results for input(s): AMMONIA in the last 168 hours. Coagulation Profile:  Recent Labs Lab 08/31/15 0058 09/01/15 0340 09/02/15 0621  INR 3.75* 3.41* 1.78*   Cardiac Enzymes:  Recent Labs Lab 08/31/15 0058 08/31/15 1109 08/31/15 1755  TROPONINI 0.03 0.16* 0.11*   BNP (last 3 results) No results for input(s): PROBNP in the last 8760 hours. HbA1C: No results for input(s): HGBA1C in the last 72  hours. CBG:  Recent Labs Lab 09/01/15 1137 09/01/15 1516 09/01/15 2321 09/02/15 0752 09/02/15 1240  GLUCAP 119* 113* 121* 130* 118*   Lipid Profile: No results for input(s): CHOL, HDL, LDLCALC, TRIG, CHOLHDL, LDLDIRECT in the last 72 hours. Thyroid Function Tests: No results for input(s): TSH, T4TOTAL, FREET4, T3FREE, THYROIDAB in the last 72 hours. Anemia Panel: No results for input(s): VITAMINB12, FOLATE, FERRITIN, TIBC, IRON, RETICCTPCT in the last 72 hours.  Sepsis Labs:  Recent Labs Lab 08/31/15 0055 08/31/15 1109 08/31/15 1112 09/01/15 0340  PROCALCITON  --  0.40  --  0.29  LATICACIDVEN 2.67*  --  1.4  --     Recent Results (from the past 240 hour(s))  Urine culture     Status: Abnormal   Collection Time: 08/31/15 12:38 AM  Result Value Ref Range Status   Specimen Description URINE, CLEAN CATCH  Final   Special Requests NONE  Final   Culture >=100,000 COLONIES/mL ENTEROCOCCUS SPECIES (A)  Final   Report Status 09/02/2015 FINAL  Final   Organism ID, Bacteria ENTEROCOCCUS SPECIES (A)  Final      Susceptibility   Enterococcus species - MIC*    AMPICILLIN <=2 SENSITIVE Sensitive     LEVOFLOXACIN 1 SENSITIVE Sensitive     NITROFURANTOIN <=16 SENSITIVE Sensitive     VANCOMYCIN 2 SENSITIVE Sensitive     * >=100,000  COLONIES/mL ENTEROCOCCUS SPECIES  Blood culture (routine x 2)     Status: None (Preliminary result)   Collection Time: 08/31/15 12:51 AM  Result Value Ref Range Status   Specimen Description BLOOD RIGHT HAND  Final   Special Requests BOTTLES DRAWN AEROBIC AND ANAEROBIC 6CC EACH  Final   Culture NO GROWTH 1 DAY  Final   Report Status PENDING  Incomplete  Blood culture (routine x 2)     Status: None (Preliminary result)   Collection Time: 08/31/15 12:58 AM  Result Value Ref Range Status   Specimen Description BLOOD LEFT HAND  Final   Special Requests BOTTLES DRAWN AEROBIC AND ANAEROBIC [place] EACH  Final   Culture NO GROWTH 1 DAY  Final   Report Status PENDING  Incomplete  MRSA PCR Screening     Status: None   Collection Time: 08/31/15  5:16 AM  Result Value Ref Range Status   MRSA by PCR NEGATIVE NEGATIVE Final    Comment:        The GeneXpert MRSA Assay (FDA approved for NASAL specimens only), is one component of a comprehensive MRSA colonization surveillance program. It is not intended to diagnose MRSA infection nor to guide or monitor treatment for MRSA infections.   Culture, respiratory (NON-Expectorated)     Status: None   Collection Time: 08/31/15 11:13 AM  Result Value Ref Range Status   Specimen Description ENDOTRACHEAL  Final   Special Requests NONE  Final   Gram Stain   Final    MODERATE WBC PRESENT,BOTH PMN AND MONONUCLEAR FEW GRAM POSITIVE COCCI IN PAIRS FEW GRAM VARIABLE ROD    Culture Consistent with normal respiratory flora.  Final   Report Status 09/02/2015 FINAL  Final         Radiology Studies: Dg Chest Port 1 View  09/01/2015  CLINICAL DATA: Pneumonia. EXAM: PORTABLE CHEST 1 VIEW COMPARISON:  08/31/2015. FINDINGS: Endotracheal tube, NG tube, left IJ line stable position. Prior CABG. Stable cardiomegaly. Continued interval clearing of bilateral pulmonary infiltrates/edema. Small left pleural effusion. No pneumothorax. IMPRESSION: 1. Lines and tubes in  stable position. 2. Prior  CABG. Continued interval clearing of bilateral pulmonary infiltrates consistent with clearing pulmonary edema. Small left pleural effusion. Electronically Signed   By: Marcello Moores  Register   On: 09/01/2015 07:26        Scheduled Meds: . atorvastatin  80 mg Oral q1800  . chlorhexidine gluconate (SAGE KIT)  15 mL Mouth Rinse BID  . hydrocortisone sod succinate (SOLU-CORTEF) inj  50 mg Intravenous Q6H  . insulin aspart  0-15 Units Subcutaneous TID WC  . lisinopril  2.5 mg Oral Daily  . warfarin  7.5 mg Oral ONCE-1800  . Warfarin - Pharmacist Dosing Inpatient   Does not apply q1800   Continuous Infusions: . sodium chloride Stopped (09/01/15 1800)     LOS: 2 days    Time spent: 45 minutes.    [facility], MD [facility] Pager [phone]  If 7PM-7AM, please contact night-coverage [email or web address] Password [email or web address] 09/02/2015, 5:11 PM

## 2015-09-02 NOTE — Progress Notes (Signed)
Initial Nutrition Assessment  DOCUMENTATION CODES:   Obesity unspecified  INTERVENTION:   -RD will follow for diet advancement and PO intake; supplement diet as appropriate  NUTRITION DIAGNOSIS:   Inadequate oral intake related to inability to eat as evidenced by NPO status.  Resolved  GOAL:   Patient will meet greater than or equal to 90% of their needs  Progressing  MONITOR:   PO intake, Diet advancement, Labs, Weight trends, Skin, I & O's  REASON FOR ASSESSMENT:   Consult Enteral/tube feeding initiation and management  ASSESSMENT:   81 yo male with recent Lt TKR and rehab stay presents with cough, dyspnea and acute hypoxic/hypercapnic respiratory failure from HCAP, and sepsis.  Pt extubated on 09/01/15. Pt transferred from ICU to regular floor on 09/01/15.   Pt has been advanced to a full liquid diet. Tolerating diet well. Noted 80-100% meal completion.   Therapy recommending home health PT.   Labs reviewed: CBGS: 121-130.   Diet Order:  Diet full liquid Room service appropriate?: Yes; Fluid consistency:: Thin Diet NPO time specified Except for: Sips with Meds  Skin:  Reviewed, no issues  Last BM:  09/01/15  Height:   Ht Readings from Last 1 Encounters:  08/31/15 5\' 11"  (1.803 m)    Weight:   Wt Readings from Last 1 Encounters:  09/02/15 240 lb 11.9 oz (109.2 kg)    Ideal Body Weight:  78.2 kg  BMI:  Body mass index is 33.59 kg/(m^2).  Estimated Nutritional Needs:   Kcal:  1800-2000  Protein:  85-100 grams  Fluid:  1.8-2.0 L  EDUCATION NEEDS:   No education needs identified at this time  Leslieanne Cobarrubias A. Jimmye Norman, RD, LDN, CDE Pager: [phone] After hours Pager: [phone]

## 2015-09-02 NOTE — Evaluation (Signed)
Physical Therapy Evaluation Patient Details Name: Benjamin Cordova MRN: [id number] DOB: 10-29-1934 Today's Date: 09/02/2015   History of Present Illness  Pt is 81 yo male with h/o  left TKA  April 2017,  MI, CVA, polio age 70, affected RLE, OA, CABG admitted with respiratory failure.   Clinical Impression  Pt admitted with above diagnosis. Pt currently with functional limitations due to the deficits listed below (see PT Problem List). Pt ambulated 200' with RW, no loss of balance, HR up to 127, 2/4 dyspnea. Performed L TKA exercises. Encouraged pt to ambulate in halls 2-3x/day.  Pt will benefit from skilled PT to increase their independence and safety with mobility to allow discharge to the venue listed below.       Follow Up Recommendations Home health PT    Equipment Recommendations  None recommended by PT    Recommendations for Other Services       Precautions / Restrictions Precautions Precautions: Knee Precaution Comments: pt denies h/o falls in past year Restrictions Weight Bearing Restrictions: No      Mobility  Bed Mobility Overal bed mobility: Independent                Transfers Overall transfer level: Independent                  Ambulation/Gait Ambulation/Gait assistance: Modified independent (Device/Increase time) Ambulation Distance (Feet): 200 Feet Assistive device: Rolling walker (2 wheeled) Gait Pattern/deviations: Step-through pattern Gait velocity: WFL   General Gait Details: steady with RW, HR up to 127 with ambulation, no pulse ox available on unit to check SaO2, 2/4 dyspnea  Stairs            Wheelchair Mobility    Modified Rankin (Stroke Patients Only)       Balance Overall balance assessment: Modified Independent                                           Pertinent Vitals/Pain Pain Assessment: 0-10 Pain Score: 2  Pain Location: L knee with walking Pain Descriptors / Indicators: Sore Pain  Intervention(s): Monitored during session;Limited activity within patient's tolerance;Ice applied;Premedicated before session    [place] expects to be discharged to:: Private residence Living Arrangements: Spouse/significant other Available Help at Discharge: Family;Available 24 hours/day Type of Home: House Home Access: Stairs to enter Entrance Stairs-Rails: Left Entrance Stairs-Number of Steps: 4 Home Layout: One level Home Equipment: Walker - 2 wheels;Walker - 4 wheels;Grab bars - tub/shower;Grab bars - toilet      Prior Function Level of Independence: Independent with assistive device(s)         Comments: walked with limp due to polio (has R ankle weakness), has used RW since TKA in April 2017, 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     Hand Dominance   Dominant Hand: Right    Extremity/Trunk Assessment   Upper Extremity Assessment: Overall WFL for tasks assessed           Lower Extremity Assessment: RLE deficits/detail;LLE deficits/detail RLE Deficits / Details: +1/5 ankle PF/DF 2* post-polio, sensation intact to light touch LLE Deficits / Details: knee flexion AROM 90*, SLR +3/5, ankle WNL  Cervical / Trunk Assessment: Normal  Communication   Communication: No difficulties  Cognition Arousal/Alertness: Awake/alert Behavior During Therapy: WFL for tasks assessed/performed Overall Cognitive Status: Within  Functional Limits for tasks assessed                      General Comments      Exercises Total Joint Exercises Quad Sets: AROM;Left;10 reps;Supine Short Arc Quad: AROM;Left;10 reps;Supine Heel Slides: AAROM;Left;10 reps;Supine Hip ABduction/ADduction: AROM;Left;10 reps;Supine Goniometric ROM: 0-90* L knee AAROM      Assessment/Plan    PT Assessment Patient needs continued PT services  PT Diagnosis Difficulty walking   PT Problem List Decreased range of motion;Decreased activity  tolerance;Pain  PT Treatment Interventions     PT Goals (Current goals can be found in the Care Plan section) Acute Rehab PT Goals Patient Stated Goal: return to working out at [facility] PT Goal Formulation: With patient/family Time For Goal Achievement: 09/16/15 Potential to Achieve Goals: Good    Frequency Min 3X/week   Barriers to discharge        Co-evaluation               End of Session Equipment Utilized During Treatment: Gait belt Activity Tolerance: Patient tolerated treatment well Patient left: in bed;with call bell/phone within reach;with family/visitor present Nurse Communication: Mobility status         Time: [id number] PT Time Calculation (min) (ACUTE ONLY): 25 min   Charges:   PT Evaluation $PT Eval Low Complexity: 1 Procedure PT Treatments $Gait Training: 8-22 mins   PT G Codes:        Philomena Doheny 09/02/2015, 2:00 PM [phone]

## 2015-09-02 NOTE — Evaluation (Signed)
Occupational Therapy Evaluation and Discharge Patient Details Name: Benjamin Cordova MRN: [id number] DOB: September 08, 1934 Today's Date: 09/02/2015    History of Present Illness Pt is 81 yo male with h/o  left TKA  April 2017,  MI, CVA, polio age 63, affected RLE, OA, CABG admitted with respiratory failure.    Clinical Impression   Pt was performing ADL and mobilizing at a modified independent level prior to admission. Pt performing at his baseline. Pt eager to go home, stating he just needs to exercise to get better and did not want to undergo stress test. RN notified.    Follow Up Recommendations  No OT follow up    Equipment Recommendations  None recommended by OT    Recommendations for Other Services       Precautions / Restrictions Precautions Precautions: Knee;Fall Precaution Comments: pt denies h/o falls in past year Restrictions Weight Bearing Restrictions: No      Mobility Bed Mobility Overal bed mobility: Independent                Transfers Overall transfer level: Independent Equipment used: Rolling walker (2 wheeled)             General transfer comment: no physical assist, use of RW    Balance Overall balance assessment: Modified Independent                                          ADL Overall ADL's : At baseline;Needs assistance/impaired Eating/Feeding: Independent;Sitting   Grooming: Wash/dry hands;Standing;Supervision/safety   Upper Body Bathing: Set up;Sitting   Lower Body Bathing: Set up;Sit to/from stand   Upper Body Dressing : Set up;Sitting   Lower Body Dressing: Set up;Sit to/from stand   Toilet Transfer: Supervision/safety;Ambulation   Toileting- Clothing Manipulation and Hygiene: Modified independent;Sit to/from stand               Vision     Perception     Praxis      Pertinent Vitals/Pain Pain Assessment: No/denies pain Pain Score: 2  Pain Location: L knee with walking Pain Descriptors /  Indicators: Sore Pain Intervention(s): Monitored during session;Limited activity within patient's tolerance;Ice applied;Premedicated before session     Hand Dominance Right   Extremity/Trunk Assessment Upper Extremity Assessment Upper Extremity Assessment: Overall WFL for tasks assessed   Lower Extremity Assessment Lower Extremity Assessment: Defer to PT evaluation RLE Deficits / Details: +1/5 ankle PF/DF 2* post-polio, sensation intact to light touch LLE Deficits / Details: knee flexion AROM 90*, SLR +3/5, ankle WNL   Cervical / Trunk Assessment Cervical / Trunk Assessment: Normal   Communication Communication Communication: No difficulties   Cognition Arousal/Alertness: Awake/alert Behavior During Therapy: Flat affect Overall Cognitive Status: No family/caregiver present to determine baseline cognitive functioning                     General Comments       Exercises       Shoulder Instructions      Home Living Family/patient expects to be discharged to:: Private residence Living Arrangements: Spouse/significant other Available Help at Discharge: Family;Available 24 hours/day Type of Home: House Home Access: Stairs to enter [employer] of Steps: 4 Entrance Stairs-Rails: Left Home Layout: One level     Bathroom Shower/Tub: Tub/shower unit;Curtain   Bathroom Toilet: Handicapped height     Home Equipment: Environmental consultant - 2 wheels;Walker -  4 wheels;Grab bars - tub/shower;Grab bars - toilet;Shower seat          Prior Functioning/Environment Level of Independence: Independent with assistive device(s)        Comments: walked with limp due to polio (has R ankle weakness), has used RW since TKA in April 2017, 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    OT Diagnosis: Generalized weakness   OT Problem List:     OT Treatment/Interventions:      OT Goals(Current goals can be found in the care plan section) Acute Rehab  OT Goals Patient Stated Goal: return to working out at [employer]  OT Frequency:     Barriers to D/C:            Co-evaluation              End of Session Equipment Utilized During Treatment: Gait belt  Activity Tolerance: Patient tolerated treatment well Patient left: in bed;with call bell/phone within reach;with nursing/sitter in room   Time: [id number] OT Time Calculation (min): 16 min Charges:  OT General Charges $OT Visit: 1 Procedure OT Evaluation $OT Eval Moderate Complexity: 1 Procedure G-Codes:    Malka So 09/02/2015, 4:08 PM  [phone]

## 2015-09-02 NOTE — Progress Notes (Signed)
    Subjective:  Denies CP or dyspnea   Objective:  Filed Vitals:   09/01/15 1700 09/01/15 1900 09/01/15 2242 09/02/15 0508  BP: 97/74 114/70 156/65 117/74  Pulse: 94 102 110 96  Temp:  98.7 F (37.1 C) 98.3 F (36.8 C) 98.2 F (36.8 C)  TempSrc:  Oral Oral Oral  Resp: 21 20 18 18   Height:      Weight:    240 lb 11.9 oz (109.2 kg)  SpO2: 100% 95% 97% 94%    Intake/Output from previous day:  Intake/Output Summary (Last 24 hours) at 09/02/15 0855 Last data filed at 09/02/15 [id number]  Gross per 24 hour  Intake 1153.35 ml  Output   1175 ml  Net -21.65 ml    Physical Exam: Physical exam: Well-developed well-nourished in no acute distress.  Skin is warm and dry.  HEENT is normal.  Neck is supple.  Chest with mild basilar crackles Cardiovascular exam is regular rate and rhythm.  Abdominal exam nontender or distended. No masses palpated. Extremities show trace edema; s/p left TKA. neuro grossly intact    Lab Results: Basic Metabolic Panel:  Recent Labs  09/01/15 0340 09/01/15 1645 09/02/15 0621  NA 141  --  139  K 3.8  --  4.0  CL 108  --  107  CO2 28  --  28  GLUCOSE 120*  --  137*  BUN 25*  --  20  CREATININE 0.71  --  0.64  CALCIUM 8.3*  --  8.8*  MG 2.0 2.0  --   PHOS 2.2* 2.3*  --    CBC:  Recent Labs  08/31/15 0058 09/01/15 0340 09/02/15 0621  WBC 13.3* 5.8 6.2  NEUTROABS 9.7*  --   --   HGB 12.7* 8.7* 9.1*  HCT 40.3 28.4* 29.1*  MCV 94.4 93.7 92.7  PLT 271 129* 133*   Cardiac Enzymes:  Recent Labs  08/31/15 0058 08/31/15 1109 08/31/15 1755  TROPONINI 0.03 0.16* 0.11*     Assessment/Plan:  1 acute combined systolic/diastolic congestive heart failure-I have reviewed his previous echocardiograms. His apical wall motion abnormality was noted previously. He does have reduced LV function and he has 1+ lower extremity edema. He remains mildly volume overloaded. Will give Lasix 40 mg IV today and reassess tomorrow. He will likely need oral  diuretic at home. Add lisinopril 2.5 mg daily. If blood pressure allows add carvedilol in the morning. His presentation is somewhat concerning for ischemia mediated event. However his electrocardiogram was nondiagnostic and his enzymes are not consistent with an acute coronary syndrome. His wall motion abnormality was documented previously. He also has 1+ edema on examination leading me to believe there is also a component of chronic congestive heart failure. Will plan nuclear study in AM to exclude ischemia. 2 LV apical thrombus-continue Coumadin. 3 coronary artery disease-continue statin. No aspirin given need for Coumadin. 4 Hyperlipidemia - given document coronary disease Lipitor increased to 80 mg daily. Check lipids and liver in 4 weeks. 5 History of CVA-continue Coumadin.  [name] 09/02/2015, 8:55 AM

## 2015-09-02 NOTE — Care Management Important Message (Signed)
Important Message  Patient Details  Name: Benjamin Cordova MRN: [id number] Date of Birth: [birth year]   Medicare Important Message Given:  Yes    Loann Quill 09/02/2015, 11:04 AM

## 2015-09-02 NOTE — Progress Notes (Signed)
Benjamin Cordova, [place] paged to make aware while patient was ambulating/bathing HR 120s-140s. HR at rest currently 106.

## 2015-09-02 NOTE — Consult Note (Signed)
   [facility] CM Inpatient Consult   09/02/2015  Benjamin Cordova [birth year] [id number]  Patient screened for potential [place] Management services. Patient is eligible for [facility] Care Management services under patient's [facility]  plan. Patient's discharge plan is not defined for disposition.  Inpatient RNCM was not currently on unit.  Chart review reveals that the patient had discharge home recently from the hospital and a skilled nursing facility.   Please place a [facility] Care Management consult for community needs for home discharge or for questions contact:   Natividad Brood, RN BSN [facility] Liaison  [phone] business mobile phone Toll free office [phone]

## 2015-09-02 NOTE — Consult Note (Signed)
ANTICOAGULATION CONSULT NOTE - Follow up Pharmacy Consult for Coumadin Indication: hx TIA/CVA, new apical thrombus  No Known Allergies  Patient Measurements: Height: 5\' 11"  (180.3 cm) Weight: 240 lb 11.9 oz (109.2 kg) IBW/kg (Calculated) : 75.3  Vital Signs: Temp: 97.4 F (36.3 C) (06/02 1455) Temp Source: Oral (06/02 1455) BP: 140/70 mmHg (06/02 1455) Pulse Rate: 93 (06/02 1455)  Labs:  Recent Labs  08/31/15 0058 08/31/15 1109 08/31/15 1755 09/01/15 0340 09/02/15 0621  HGB 12.7*  --   --  8.7* 9.1*  HCT 40.3  --   --  28.4* 29.1*  PLT 271  --   --  129* 133*  LABPROT 36.2*  --   --  33.7* 20.7*  INR 3.75*  --   --  3.41* 1.78*  CREATININE 1.21  --   --  0.71 0.64  TROPONINI 0.03 0.16* 0.11*  --   --     Estimated Creatinine Clearance: 95.7 mL/min (by C-G formula based on Cr of 0.64).   Medical History: Past Medical History  Diagnosis Date  . Coronary artery disease     Multivessel s/p CABG 1981 at [facility]  . Essential hypertension   . Myocardial infarction ([place]) 1980  . Hematuria   . Peripheral vascular disease ([place])   . 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 TIA (transient ischemic attack) 1989    Chronic coumadin  . Stroke ([place])   . Shortness of breath dyspnea   . Skin cancer     "burned them off my back"  . High cholesterol   . Anginal pain ([place]) <1980  . Pneumonia 1980  . History of blood transfusion 07/2015    w/knee replacement HgB <9  . Arthritis     "all over my body"  . UTI (lower urinary tract infection) "had 4 UTI in 2016"   Assessment: 80yom on coumadin pta for hx TIA/CVA, admitted 08/31/15 with respiratory failure and subsequently intubated. INR 3.75 on admission and coumadin held. ECHO done 5/31 revealed possible new apical thrombus. INR down to 3.41 yesterday and coumadin ordered to continue. LFTs wnl. Hgb/plts with significant drop yesterday (12.7>8.7 and 271>129) but improved to Hgb 9.1 and pltc 133K today.  -  will watch closely. No drug interactions noted.  Appears home dose of coumadin is 10mg  daily. Last dose taken prior to this admission reported as within the past week, thus possibly missed a dose PTA.   Goal of Therapy:  INR 2-3 Monitor platelets by anticoagulation protocol: Yes   Plan:  Coumadin 7.5 mg tonight Daily INR  Nicole Cella, RPh Clinical Pharmacist Pager: [phone] 09/02/2015,3:10 PM

## 2015-09-03 ENCOUNTER — Inpatient Hospital Stay (HOSPITAL_COMMUNITY): Payer: Commercial Managed Care - HMO

## 2015-09-03 DIAGNOSIS — I213 ST elevation (STEMI) myocardial infarction of unspecified site: Secondary | ICD-10-CM

## 2015-09-03 DIAGNOSIS — I5043 Acute on chronic combined systolic (congestive) and diastolic (congestive) heart failure: Secondary | ICD-10-CM | POA: Insufficient documentation

## 2015-09-03 DIAGNOSIS — E876 Hypokalemia: Secondary | ICD-10-CM | POA: Insufficient documentation

## 2015-09-03 DIAGNOSIS — I513 Intracardiac thrombosis, not elsewhere classified: Secondary | ICD-10-CM | POA: Diagnosis not present

## 2015-09-03 DIAGNOSIS — I5023 Acute on chronic systolic (congestive) heart failure: Secondary | ICD-10-CM

## 2015-09-03 DIAGNOSIS — I5033 Acute on chronic diastolic (congestive) heart failure: Secondary | ICD-10-CM

## 2015-09-03 LAB — PROTIME-INR
INR: 1.81 — ABNORMAL HIGH (ref 0.00–1.49)
Prothrombin Time: 20.9 seconds — ABNORMAL HIGH (ref 11.6–15.2)

## 2015-09-03 LAB — NM MYOCAR MULTI W/SPECT W/WALL MOTION / EF
CHL CUP MPHR: 142 {beats}/min
CSEPEDS: 0 s
CSEPEW: 1 METS
CSEPHR: 85 %
Exercise duration (min): 0 min
Peak HR: 122 {beats}/min
Rest HR: 104 {beats}/min

## 2015-09-03 LAB — BASIC METABOLIC PANEL
Anion gap: 7 (ref 5–15)
BUN: 21 mg/dL — AB (ref 6–20)
CHLORIDE: 103 mmol/L (ref 101–111)
CO2: 30 mmol/L (ref 22–32)
CREATININE: 0.77 mg/dL (ref 0.61–1.24)
Calcium: 9 mg/dL (ref 8.9–10.3)
GFR calc Af Amer: >60 mL/min (ref >60)
GFR calc non Af Amer: >60 mL/min (ref >60)
GLUCOSE: 107 mg/dL — AB (ref 65–99)
POTASSIUM: 3.1 mmol/L — AB (ref 3.5–5.1)
SODIUM: 140 mmol/L (ref 135–145)

## 2015-09-03 LAB — HEPARIN LEVEL (UNFRACTIONATED)

## 2015-09-03 LAB — GLUCOSE, CAPILLARY
GLUCOSE-CAPILLARY: 124 mg/dL — AB (ref 65–99)
GLUCOSE-CAPILLARY: 102 mg/dL — AB (ref 65–99)
Glucose-Capillary: 142 mg/dL — ABNORMAL HIGH (ref 65–99)
Glucose-Capillary: 144 mg/dL — ABNORMAL HIGH (ref 65–99)

## 2015-09-03 LAB — HEMOGLOBIN A1C
HEMOGLOBIN A1C: 5.4 % (ref 4.8–5.6)
Mean Plasma Glucose: 108 mg/dL

## 2015-09-03 MED ORDER — CARVEDILOL 3.125 MG PO TABS
3.1250 mg | ORAL_TABLET | Freq: Two times a day (BID) | ORAL | Status: DC
Start: 2015-09-03 — End: 2015-09-04
  Administered 2015-09-03 – 2015-09-04 (×2): 3.125 mg via ORAL
  Filled 2015-09-03 (×2): qty 1

## 2015-09-03 MED ORDER — REGADENOSON 0.4 MG/5ML IV SOLN
0.4000 mg | Freq: Once | INTRAVENOUS | Status: AC
  Administered 2015-09-03: 0.4 mg via INTRAVENOUS

## 2015-09-03 MED ORDER — FUROSEMIDE 20 MG PO TABS
20.0000 mg | ORAL_TABLET | Freq: Every day | ORAL | Status: DC
  Administered 2015-09-03 – 2015-09-06 (×4): 20 mg via ORAL
  Filled 2015-09-03 (×4): qty 1

## 2015-09-03 MED ORDER — HEPARIN BOLUS VIA INFUSION
2000.0000 [IU] | Freq: Once | INTRAVENOUS | Status: AC
  Administered 2015-09-03: 2000 [IU] via INTRAVENOUS
  Filled 2015-09-03: qty 2000

## 2015-09-03 MED ORDER — HEPARIN (PORCINE) IN NACL 100-0.45 UNIT/ML-% IJ SOLN
1600.0000 [IU]/h | INTRAMUSCULAR | Status: DC
  Administered 2015-09-03 (×2): 900 [IU]/h via INTRAVENOUS
  Administered 2015-09-04: 1500 [IU]/h via INTRAVENOUS
  Administered 2015-09-04: 1200 [IU]/h via INTRAVENOUS
  Administered 2015-09-05 – 2015-09-06 (×2): 1600 [IU]/h via INTRAVENOUS
  Filled 2015-09-03 (×4): qty 250

## 2015-09-03 MED ORDER — HYDROCORTISONE NA SUCCINATE PF 100 MG IJ SOLR
50.0000 mg | Freq: Every day | INTRAMUSCULAR | Status: DC
  Administered 2015-09-04 – 2015-09-05 (×2): 50 mg via INTRAVENOUS
  Filled 2015-09-03 (×2): qty 2

## 2015-09-03 MED ORDER — POTASSIUM CHLORIDE CRYS ER 20 MEQ PO TBCR
30.0000 meq | EXTENDED_RELEASE_TABLET | ORAL | Status: AC
  Administered 2015-09-03 (×2): 30 meq via ORAL
  Filled 2015-09-03 (×2): qty 1

## 2015-09-03 MED ORDER — WARFARIN SODIUM 7.5 MG PO TABS
7.5000 mg | ORAL_TABLET | Freq: Once | ORAL | Status: AC
  Administered 2015-09-03: 7.5 mg via ORAL
  Filled 2015-09-03: qty 1

## 2015-09-03 MED ORDER — REGADENOSON 0.4 MG/5ML IV SOLN
INTRAVENOUS | Status: AC
  Administered 2015-09-03: 0.4 mg via INTRAVENOUS
  Filled 2015-09-03: qty 5

## 2015-09-03 MED ORDER — TECHNETIUM TC 99M TETROFOSMIN IV KIT
30.0000 | PACK | Freq: Once | INTRAVENOUS | Status: AC | PRN
  Administered 2015-09-03: 30 via INTRAVENOUS

## 2015-09-03 MED ORDER — TECHNETIUM TC 99M TETROFOSMIN IV KIT
10.0000 | PACK | Freq: Once | INTRAVENOUS | Status: AC | PRN
  Administered 2015-09-03: 10 via INTRAVENOUS

## 2015-09-03 NOTE — Progress Notes (Addendum)
Hospital Problem List     Active Problems:   Respiratory failure with hypoxia (HCC)   HCAP (healthcare-associated pneumonia)   Acute respiratory failure with hypoxia ([place])   Encounter for central line placement   Knee swelling   CHF (congestive heart failure) [facility])    Patient Profile:   Primary Cardiologist: Dr. Domenic Polite  81 yo male with PMH of CAD (s/p redo CABG x2 with left mammary to LAD and left radial to OM 2 10/2014)/HTN/PVD/Stroke and s/p left knee replacement 07/2015 who presented to [facility] with dyspnea and LEE requiring intubation and transferred to [facility].   Subjective   Seen in Nuclear Medicine for 1-day NST. Denies any chest pain or shortness of breath overnight.  Inpatient Medications    . atorvastatin  80 mg Oral q1800  . chlorhexidine gluconate (SAGE KIT)  15 mL Mouth Rinse BID  . hydrocortisone sod succinate (SOLU-CORTEF) inj  50 mg Intravenous Q12H  . lisinopril  2.5 mg Oral Daily  . potassium chloride  30 mEq Oral Q4H  . Warfarin - Pharmacist Dosing Inpatient   Does not apply q1800    Vital Signs    Filed Vitals:   09/03/15 0900 09/03/15 0936 09/03/15 0937 09/03/15 0939  BP: 157/80 158/79 150/78 144/77  Pulse: 102 115 112 110  Temp:      TempSrc:      Resp:      Height:      Weight:      SpO2:        Intake/Output Summary (Last 24 hours) at 09/03/15 0944 Last data filed at 09/03/15 0458  Gross per 24 hour  Intake    720 ml  Output   2100 ml  Net  -1380 ml   Filed Weights   09/01/15 0500 09/02/15 0508 09/03/15 0659  Weight: 238 lb 1.6 oz (108 kg) 240 lb 11.9 oz (109.2 kg) 227 lb 6.4 oz (103.148 kg)    Physical Exam    General: Well developed, well nourished, male appearing in no acute distress. Head: Normocephalic, atraumatic.  Neck: Supple without bruits, JVD not elevated. Lungs:  Resp regular and unlabored, CTA without wheezing or rales. Heart: RRR, S1, S2, no S3, S4, or murmur; no rub. Abdomen: Soft, non-tender, non-distended with  normoactive bowel sounds. No hepatomegaly. No rebound/guarding. No obvious abdominal masses. Extremities: No clubbing, cyanosis, or edema. Distal pedal pulses are 2+ bilaterally. Neuro: Alert and oriented X 3. Moves all extremities spontaneously. Psych: Normal affect.  Labs    CBC  Recent Labs  09/01/15 0340 09/02/15 0621  WBC 5.8 6.2  HGB 8.7* 9.1*  HCT 28.4* 29.1*  MCV 93.7 92.7  PLT 129* 622*   Basic Metabolic Panel  Recent Labs  09/01/15 0340 09/01/15 1645 09/02/15 0621 09/03/15 0451  NA 141  --  139 140  K 3.8  --  4.0 3.1*  CL 108  --  107 103  CO2 28  --  28 30  GLUCOSE 120*  --  137* 107*  BUN 25*  --  20 21*  CREATININE 0.71  --  0.64 0.77  CALCIUM 8.3*  --  8.8* 9.0  MG 2.0 2.0  --   --   PHOS 2.2* 2.3*  --   --    Liver Function Tests  Recent Labs  09/01/15 0340 09/02/15 0621  AST 17 22  ALT 13* 16*  ALKPHOS 50 52  BILITOT 0.6 0.8  PROT 5.1* 5.6*  ALBUMIN 2.7* 2.9*  No results for input(s): LIPASE, AMYLASE in the last 72 hours. Cardiac Enzymes  Recent Labs  08/31/15 1109 08/31/15 1755  TROPONINI 0.16* 0.11*   BNP Invalid input(s): POCBNP D-Dimer No results for input(s): DDIMER in the last 72 hours. Hemoglobin A1C  Recent Labs  09/02/15 1822  HGBA1C 5.4   Fasting Lipid Panel No results for input(s): CHOL, HDL, LDLCALC, TRIG, CHOLHDL, LDLDIRECT in the last 72 hours. Thyroid Function Tests  Recent Labs  09/02/15 1822  TSH 1.363    Telemetry    Not reviewed, seen in Nuclear Medicine.  ECG    No new tracings.   Cardiac Studies and Radiology    Dg Chest Port 1 View  09/01/2015  CLINICAL DATA: Pneumonia. EXAM: PORTABLE CHEST 1 VIEW COMPARISON:  08/31/2015. FINDINGS: Endotracheal tube, NG tube, left IJ line stable position. Prior CABG. Stable cardiomegaly. Continued interval clearing of bilateral pulmonary infiltrates/edema. Small left pleural effusion. No pneumothorax. IMPRESSION: 1. Lines and tubes in stable position. 2.  Prior CABG. Continued interval clearing of bilateral pulmonary infiltrates consistent with clearing pulmonary edema. Small left pleural effusion. Electronically Signed   By: Marcello Moores  Register   On: 09/01/2015 07:26   Dg Chest Port 1 View  08/31/2015  CLINICAL DATA:  Hypoxia.  Central catheter placed EXAM: PORTABLE CHEST 1 VIEW COMPARISON:  Study obtained earlier in the day FINDINGS: Endotracheal tube tip is 5.8 cm above carina. Central catheter tip is in the superior vena cava near the cavoatrial junction. Nasogastric tube tip and side port are below the diaphragm. No pneumothorax. There is less airspace consolidation compared to earlier in the day. There is patchy airspace disease currently with underlying interstitial edema and bilateral pleural effusions. There is cardiomegaly with mild pulmonary venous hypertension. No new opacity. No adenopathy evident. IMPRESSION: Tube and catheter positions as described without pneumothorax. Evidence a degree of congestive heart failure. Considerably less alveolar consolidation is noted compared to earlier in the day. No new opacity currently. Electronically Signed   By: Lowella Grip III M.D.   On: 08/31/2015 08:00   Dg Chest Portable 1 View  08/31/2015  CLINICAL DATA:  Endotracheal tube placement.  Shortness of breath. EXAM: PORTABLE CHEST 1 VIEW COMPARISON:  12/15/2014 FINDINGS: Endotracheal tube 5.9 cm from the carina. Patient is post median sternotomy. Heart at the upper limits normal in size. Right greater than left diffuse opacities, primarily perihilar. Suspect small pleural effusions and fluid in the right minor fissure. No evidence of pneumothorax. IMPRESSION: 1. Endotracheal tube 5.9 cm from the carina. 2. Diffuse right greater than left opacities, may be pulmonary edema, aspiration or pneumonia. Suspect small pleural effusions. Electronically Signed   By: Jeb Levering M.D.   On: 08/31/2015 00:38   Dg Knee Left Port  08/31/2015  CLINICAL DATA:  Left  knee swelling and tenderness. Status post left knee replacement. EXAM: PORTABLE LEFT KNEE - 1-2 VIEW COMPARISON:  07/27/2015 FINDINGS: No fracture. No bone lesion. Femoral tibial prosthetic components appear well seated and aligned without evidence of loosening. There is a large joint effusion. Subcutaneous edema is seen diffusely. IMPRESSION: 1. No fracture or bone lesion. 2. No evidence of prosthetic loosening. 3. Large joint effusion and nonspecific soft tissue edema. Electronically Signed   By: Lajean Manes M.D.   On: 08/31/2015 08:23   Dg Abd Portable 1v  08/31/2015  CLINICAL DATA:  Nasogastric tube placement EXAM: PORTABLE ABDOMEN - 1 VIEW COMPARISON:  CT abdomen and pelvis September 13, 2010 FINDINGS: Nasogastric tube tip and  side port are in the stomach with the nasogastric tube tip near the pylorus. There is diffuse stool throughout colon. There is no bowel dilatation or air-fluid level suggesting obstruction. No free air. IMPRESSION: Nasogastric tube tip and side port in stomach. Overall bowel gas pattern unremarkable. Diffuse stool noted in colon. Electronically Signed   By: Lowella Grip III M.D.   On: 08/31/2015 08:23    Echocardiogram: 08/31/2015 Study Conclusions - Left ventricle: The cavity size was normal. Systolic function was  mildly to moderately reduced. The estimated ejection fraction was  in the range of 40% to 45%. There is akinesis of the entireapical  myocardium. There is akinesis of the apicalanterior and inferior  myocardium. Features are consistent with a pseudonormal left  ventricular filling pattern, with concomitant abnormal relaxation  and increased filling pressure (grade 2 diastolic dysfunction).  Acoustic contrast opacification revealed a possible,  apicalthrombus. - Mitral valve: There was mild regurgitation. - Pericardium, extracardiac: There was a left pleural effusion.  Assessment & Plan    1. Acute combined systolic/diastolic congestive heart  failure - Echo shows EF of 40-45%.  Appears euvolemic on exam.  - Has received IV diuresis while admitted. Will switch to oral Lasix 1m daily. May require further titration as an outpatient.  - started on Lisinopril 2.528mdaily. Will add low-dose Coreg, as BP remains elevated. - Seen in Nuclear Medicine for 1-day NST. Official results pending. If low-risk, can likely be discharged from a cardiology perspective with close outpatient follow-up once INR > 2-2.5  2. LV apical thrombus - continue Coumadin. INR subtherapeutic so need to start IV heparin given history of CVA until INR > 2.0-2.5  3. Coronary artery disease -continue statin. No aspirin given need for Coumadin. - NST results pending.  4. Hyperlipidemia  - given documented coronary disease, Lipitor increased to 80 mg daily. Check lipids and liver in 4 weeks.  5. History of CVA -continue Coumadin.  SiArna Medici PA-C 9:44 AM 09/03/2015 Pager: [phone] seen and independently examined with BrDarlen RoundPA. We discussed all aspects of the encounter. I agree with the assessment and plan as stated above.  Patient admitted with acute CHF and respiratory failure requiring intubation.  Noted to have acute pulmonary edema and PNA.  Started on IV diuretics with good UOP.  He has a historyoo  f remote CVA and has been on chronic coumadin.  His INR on admission was therapeutic but now subtherapeutic.  Noted on echo to have new apical LV thrombus.  Apparently was not on lasix at home.  Has diuresed well and is net neg 3.3L.  He appears euvolemic on exam today.  Will change to Lasix 2080mO daily.  His INR is not therapuetic and with known LV thrombus and history of CVA will start IV Heparin gtt until INR therapeutic on coumadin.  Got nuclear stress test done today and results are pending. Needs INR > 2 prior to discharge.  Signed: TraFransico HimD [facility] 09/03/2015

## 2015-09-03 NOTE — Consult Note (Signed)
ANTICOAGULATION CONSULT NOTE - Follow up Pharmacy Consult for heparin  Indication: hx TIA/CVA, new apical thrombus  No Known Allergies  Patient Measurements: Height: 5\' 11"  (180.3 cm) Weight: 227 lb 6.4 oz (103.148 kg) IBW/kg (Calculated) : 75.3  Heparin dosing weight: 96.7 kg  Vital Signs: Temp: 98 F (36.7 C) (06/03 2133) Temp Source: Oral (06/03 1259) BP: 119/53 mmHg (06/03 2133) Pulse Rate: 92 (06/03 2133)  Labs:  Recent Labs  09/01/15 0340 09/02/15 0621 09/03/15 0451 09/03/15 2104  HGB 8.7* 9.1*  --   --   HCT 28.4* 29.1*  --   --   PLT 129* 133*  --   --   LABPROT 33.7* 20.7* 20.9*  --   INR 3.41* 1.78* 1.81*  --   HEPARINUNFRC  --   --   --  <0.10*  CREATININE 0.71 0.64 0.77  --     Estimated Creatinine Clearance: 93 mL/min (by C-G formula based on Cr of 0.77).   Medical History: Past Medical History  Diagnosis Date  . Coronary artery disease     Multivessel s/p CABG 1981 at [facility]  . Essential hypertension   . Myocardial infarction ([place]) 1980  . Hematuria   . Peripheral vascular disease ([place])   . 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 TIA (transient ischemic attack) 1989    Chronic coumadin  . Stroke ([place])   . Shortness of breath dyspnea   . Skin cancer     "burned them off my back"  . High cholesterol   . Anginal pain ([place]) <1980  . Pneumonia 1980  . History of blood transfusion 07/2015    w/knee replacement HgB <9  . Arthritis     "all over my body"  . UTI (lower urinary tract infection) "had 4 UTI in 2016"   Assessment: 81yo male with h/o TIA/CVA on coumadin PTA , now with new LV apical thrombus on heparin/coumadin. Initial heparin level is undetectable on 900 units/hr (INR= 1.81)   PTA  coumadin dose: 10mg  daily. Last dose taken prior to this admission reported as within the past week, thus possibly missed a dose PTA.   Goal of Therapy:  Heparin level = 0.3-0.7 units/ml INR 2-3 Monitor platelets by  anticoagulation protocol: Yes   Plan: -heparin bolus 2000 units then increase to 1200 units/hr -Heparin level in 8 hours and daily wth CBC daily  Hildred Laser, Pharm D 09/03/2015 10:14 PM

## 2015-09-03 NOTE — Progress Notes (Signed)
PROGRESS NOTE  VANDEN FAWAZ  SLH:[id number] DOB: 07/22/1934  DOA: 08/31/2015 PCP: Wende Neighbors, MD   Brief Narrative:  81 yo male had Lt TKR in April 2017. He had redo CABG in July 2016. He was in short term rehab, and was discharged. He developed progressive shortness of breath and cough. He went to [facility]. He was found to have gurgling respirations, hypoxia. CXR showed infiltrate, and ABG showed hypercapnia. He was intubated. He was started on IV fluids and Abx for HCAP. He was transferred to [facility] ICU under CCM service. Extubated, off pressors. Stabilized and transferred to floor and [place] service on 6/2. Cardiology consulting.   Assessment & Plan:   Active Problems:   Respiratory failure with hypoxia (HCC)   HCAP (healthcare-associated pneumonia)   Acute respiratory failure with hypoxia (HCC)   Encounter for central line placement   Knee swelling   CHF (congestive heart failure) (HCC)   LV (left ventricular) mural thrombus (HCC)   Acute hypoxic and hypercapnic respiratory failure - Secondary to decompensated CHF - Extubated 6/1. Antibiotics discontinued-not felt to be infectious etiology. Significant improvement with diuresis-continue. - Resolved.  Acute combined systolic and diastolic CHF - S/P VDRF d/t this - 2-D echo 5/31: LVEF 40-45 percent, akinesis of the entire apical myocardium, apical anterior and inferior myocardium, grade 2 diastolic dysfunction and apical thrombus - Diuresed with IV Lasix and - 3.6 L since admission. Lasix changed to by mouth 20 MG daily. - Lisinopril 2.5 MG daily and low-dose carvedilol added. Cardiology follow-up appreciated. His presentation is somewhat concerning for ischemic mediated event hence nuclear stress test obtained 6/3 >-risk. Await cardiology recommendations.  A. Fib/apical thrombus - Continue Coumadin per pharmacy. INR subtherapeutic at 1.81. IV heparin started until INR >2.  Essential hypertension - Mildly uncontrolled at times.  Low-dose lisinopril and carvedilol started this admission.  Hyperlipidemia - Lipitor increased to 80 MG daily. Follow-up LFTs and fasting lipids in 4 weeks.  Shock - Unclear etiology. Some concern for septic shock initially from HCAP. Empirically treated with vancomycin, Zosyn, pressors and stress dose steroids.  - Rapid resolution, PCT low trend, clinically resolved fast consistent with heart failure, abnormal echo noted >Finally felt not infectious etiology and antibiotics were discontinued. Off of pressors. Taper down steroids to DC. - Blood cultures 2: Negative to date. Urine culture: Enterococcus but urine microscopy not impressive for UTI and in the absence of symptoms, we'll not initiate antibiotics for this.  Hyperglycemia - Good inpatient control  Acute metabolic encephalopathy - Resolved  History of CVA - Continue Coumadin. IV heparin started 6/3.   CAD, s/p CABG - Continue statin. No aspirin given need for Coumadin.  Status post left TKR April 2017 - States that he has follow-up appointment with orthopedics Tuesday next week. PT evaluation. Ambulated with the help of walker at home.  Anemia and thrombocytopenia - Stable.  Hypokalemia - Replace and follow.    DVT prophylaxis: Coumadin per pharmacy. SCDs. IV heparin until INR therapeutic >2. Code Status: Full Family Communication: Discussed with patient. No family at bedside.  Disposition Plan: DC home when medically stable, possibly early next week.   Consultants:   CCM-signed off   Cardiology  Procedures:  5/31 ETT >> 6/1 5/31 left IJ CVL >> DC'd  Antimicrobials:  5/31 Vancomycin >>6/1 5/31 Zosyn >> 6/1   Subjective: Seen this afternoon after stress test. No chest pain or dyspnea.  Objective:  Filed Vitals:   09/03/15 1572 09/03/15 6203 09/03/15 1113 09/03/15 1259  BP: 150/78 144/77 136/68 132/71  Pulse: 112 110 86 86  Temp:   98 F (36.7 C) 98.6 F (37 C)  TempSrc:    Oral  Resp:    17    Height:      Weight:      SpO2:   98% 98%    Intake/Output Summary (Last 24 hours) at 09/03/15 1636 Last data filed at 09/03/15 1407  Gross per 24 hour  Intake    480 ml  Output   1250 ml  Net   -770 ml   Filed Weights   09/01/15 0500 09/02/15 0508 09/03/15 0659  Weight: 108 kg (238 lb 1.6 oz) 109.2 kg (240 lb 11.9 oz) 103.148 kg (227 lb 6.4 oz)    Examination:  General exam: Pleasant elderly male lying comfortably supine in bed.  Respiratory system: clear to auscultation anteriorly. Reduced breath sounds posteriorly with occasional basal crackles. Respiratory effort normal. Cardiovascular system: S1 & S2 heard, RRR. No JVD, murmurs, rubs, gallops or clicks.  Trace leg edema. Gastrointestinal system: Abdomen is nondistended, soft and nontender. No organomegaly or masses felt. Normal bowel sounds heard. Central nervous system: Alert and oriented. No focal neurological deficits. Extremities: Symmetric 5 x 5 power. left knee TKR surgery site healing well without acute findings.  Skin: No rashes, lesions or ulcers Psychiatry: Judgement and insight appear normal. Mood & affect appropriate.     Data Reviewed: I have personally reviewed following labs and imaging studies  CBC:  Recent Labs Lab 08/31/15 0055 08/31/15 0058 09/01/15 0340 09/02/15 0621  WBC  --  13.3* 5.8 6.2  NEUTROABS  --  9.7*  --   --   HGB 13.6 12.7* 8.7* 9.1*  HCT 40.0 40.3 28.4* 29.1*  MCV  --  94.4 93.7 92.7  PLT  --  271 129* 157*   Basic Metabolic Panel:  Recent Labs Lab 08/31/15 0055 08/31/15 0058 08/31/15 1109 08/31/15 1822 09/01/15 0340 09/01/15 1645 09/02/15 0621 09/03/15 0451  NA 142 138  --   --  141  --  139 140  K 4.4 4.2  --   --  3.8  --  4.0 3.1*  CL 104 106  --   --  108  --  107 103  CO2  --  25  --   --  28  --  28 30  GLUCOSE 215* 217*  --   --  120*  --  137* 107*  BUN 31* 32*  --   --  25*  --  20 21*  CREATININE 1.20 1.21  --   --  0.71  --  0.64 0.77  CALCIUM  --   8.6*  --   --  8.3*  --  8.8* 9.0  MG  --   --  2.0 2.0 2.0 2.0  --   --   PHOS  --   --  3.0 2.6 2.2* 2.3*  --   --    GFR: Estimated Creatinine Clearance: 93 mL/min (by C-G formula based on Cr of 0.77). Liver Function Tests:  Recent Labs Lab 08/31/15 0058 09/01/15 0340 09/02/15 0621  AST _0 ALT 16* 13* 16*  ALKPHOS 74 50 52  BILITOT 0.5 0.6 0.8  PROT 7.0 5.1* 5.6*  ALBUMIN 4.1 2.7* 2.9*   No results for input(s): LIPASE, AMYLASE in the last 168 hours. No results for input(s): AMMONIA in the last 168 hours. Coagulation Profile:  Recent Labs Lab 08/31/15 0058  09/01/15 0340 09/02/15 0621 09/03/15 0451  INR 3.75* 3.41* 1.78* 1.81*   Cardiac Enzymes:  Recent Labs Lab 08/31/15 0058 08/31/15 1109 08/31/15 1755  TROPONINI 0.03 0.16* 0.11*   BNP (last 3 results) No results for input(s): PROBNP in the last 8760 hours. HbA1C:  Recent Labs  09/02/15 1822  HGBA1C 5.4   CBG:  Recent Labs Lab 09/02/15 1240 09/02/15 1720 09/02/15 2242 09/03/15 0752 09/03/15 1156  GLUCAP 118* 124* 126* 124* 142*   Lipid Profile: No results for input(s): CHOL, HDL, LDLCALC, TRIG, CHOLHDL, LDLDIRECT in the last 72 hours. Thyroid Function Tests:  Recent Labs  09/02/15 1822  TSH 1.363   Anemia Panel: No results for input(s): VITAMINB12, FOLATE, FERRITIN, TIBC, IRON, RETICCTPCT in the last 72 hours.  Sepsis Labs:  Recent Labs Lab 08/31/15 0055 08/31/15 1109 08/31/15 1112 09/01/15 0340  PROCALCITON  --  0.40  --  0.29  LATICACIDVEN 2.67*  --  1.4  --     Recent Results (from the past 240 hour(s))  Urine culture     Status: Abnormal   Collection Time: 08/31/15 12:38 AM  Result Value Ref Range Status   Specimen Description URINE, CLEAN CATCH  Final   Special Requests NONE  Final   Culture >=100,000 COLONIES/mL ENTEROCOCCUS SPECIES (A)  Final   Report Status 09/02/2015 FINAL  Final   Organism ID, Bacteria ENTEROCOCCUS SPECIES (A)  Final      Susceptibility     Enterococcus species - MIC*    AMPICILLIN <=2 SENSITIVE Sensitive     LEVOFLOXACIN 1 SENSITIVE Sensitive     NITROFURANTOIN <=16 SENSITIVE Sensitive     VANCOMYCIN 2 SENSITIVE Sensitive     * >=100,000 COLONIES/mL ENTEROCOCCUS SPECIES  Blood culture (routine x 2)     Status: None (Preliminary result)   Collection Time: 08/31/15 12:51 AM  Result Value Ref Range Status   Specimen Description BLOOD RIGHT HAND  Final   Special Requests BOTTLES DRAWN AEROBIC AND ANAEROBIC 6CC EACH  Final   Culture NO GROWTH 3 DAYS  Final   Report Status PENDING  Incomplete  Blood culture (routine x 2)     Status: None (Preliminary result)   Collection Time: 08/31/15 12:58 AM  Result Value Ref Range Status   Specimen Description BLOOD LEFT HAND  Final   Special Requests BOTTLES DRAWN AEROBIC AND ANAEROBIC [place] EACH  Final   Culture NO GROWTH 3 DAYS  Final   Report Status PENDING  Incomplete  MRSA PCR Screening     Status: None   Collection Time: 08/31/15  5:16 AM  Result Value Ref Range Status   MRSA by PCR NEGATIVE NEGATIVE Final    Comment:        The GeneXpert MRSA Assay (FDA approved for NASAL specimens only), is one component of a comprehensive MRSA colonization surveillance program. It is not intended to diagnose MRSA infection nor to guide or monitor treatment for MRSA infections.   Culture, respiratory (NON-Expectorated)     Status: None   Collection Time: 08/31/15 11:13 AM  Result Value Ref Range Status   Specimen Description ENDOTRACHEAL  Final   Special Requests NONE  Final   Gram Stain   Final    MODERATE WBC PRESENT,BOTH PMN AND MONONUCLEAR FEW GRAM POSITIVE COCCI IN PAIRS FEW GRAM VARIABLE ROD    Culture Consistent with normal respiratory flora.  Final   Report Status 09/02/2015 FINAL  Final         Radiology Studies: Nm  Myocar Multi W/spect W/wall Motion / Ef  09/03/2015  CLINICAL DATA:  Shortness of breath. EXAM: MYOCARDIAL IMAGING WITH SPECT (REST AND  PHARMACOLOGIC-STRESS) GATED LEFT VENTRICULAR WALL MOTION STUDY LEFT VENTRICULAR EJECTION FRACTION TECHNIQUE: Standard myocardial SPECT imaging was performed after resting intravenous injection of 10 mCi Tc-70mtetrofosmin. Subsequently, intravenous infusion of Lexiscan was performed under the supervision of the Cardiology staff. At peak effect of the drug, 30 mCi Tc-9110metrofosmin was injected intravenously and standard myocardial SPECT imaging was performed. Quantitative gated imaging was also performed to evaluate left ventricular wall motion, and estimate left ventricular ejection fraction. COMPARISON:  None. FINDINGS: Perfusion: There is a large fixed defect at the apex extending into the anterior and inferior walls compatible with old infarct/scar. Areas of decreased perfusion on the stress images in the adjacent anterior wall and lower septum concerning for peri-infarct ischemia. Wall Motion: Severe diffuse hypokinesia with akinesia in the apex, anterior and inferior walls. Dilated left ventricle. Left Ventricular Ejection Fraction: 26 % End diastolic volume 23580l End systolic volume 16998l IMPRESSION: 1. Large fixed defect at the apex extending into the anterior and inferior walls. Concern for peri-infarct ischemia in the anterior wall and lower septum. 2. Severe diffuse hypokinesia with akinesia in the areas of scar. 3. Left ventricular ejection fraction 26% 4. Non invasive risk stratification*: High risk *2012 Appropriate Use Criteria for Coronary Revascularization Focused Update: J Am Coll Cardiol. 203382;50(5):397-673http://content.onairportbarriers.comspx?articleid=[email or web address] Electronically Signed   By: KeRolm Baptise.D.   On: 09/03/2015 13:09        Scheduled Meds: . atorvastatin  80 mg Oral q1800  . carvedilol  3.125 mg Oral BID WC  . chlorhexidine gluconate (SAGE KIT)  15 mL Mouth Rinse BID  . furosemide  20 mg Oral Daily  . hydrocortisone sod succinate (SOLU-CORTEF) inj  50 mg  Intravenous Q12H  . lisinopril  2.5 mg Oral Daily  . warfarin  7.5 mg Oral ONCE-1800  . Warfarin - Pharmacist Dosing Inpatient   Does not apply q1800   Continuous Infusions: . sodium chloride Stopped (09/01/15 1800)  . heparin 900 Units/hr (09/03/15 1258)     LOS: 3 days    Time spent: 15 minutes.    [facility] Pager [phone] 7PM-7AM, please contact night-coverage [email or web address] Password [facility]/06/2015, 4:36 PM

## 2015-09-03 NOTE — Progress Notes (Signed)
  Nuclear stress test was interpreted as high-risk by [facility] due to concern for peri-infarct ischemia and an EF of 26%. His EF was noted to be 40-45% by echo this admission. Dr. Radford Pax personally reviewed his nuclear stress test images and did not see any evidence of peri-infarct ischemia.  Will not pursue further ischemic evaluation at this time. The patient was made aware of the results.  Likely stable for discharge from a cardiac perspective once INR is therapeutic.  Signed, Erma Heritage, PA-C 09/03/2015, 5:23 PM Pager: [phone]

## 2015-09-03 NOTE — Consult Note (Signed)
ANTICOAGULATION CONSULT NOTE - Follow up Pharmacy Consult for Coumadin  And add IV heparin 09/03/15 until INR = 2-2.5 Indication: hx TIA/CVA, new apical thrombus  No Known Allergies  Patient Measurements: Height: 5\' 11"  (180.3 cm) Weight: 227 lb 6.4 oz (103.148 kg) IBW/kg (Calculated) : 75.3  Heparin dosing weight: 96.7 kg  Vital Signs: Temp: 98 F (36.7 C) (06/03 1113) Temp Source: Oral (06/03 0457) BP: 136/68 mmHg (06/03 1113) Pulse Rate: 86 (06/03 1113)  Labs:  Recent Labs  08/31/15 1755 09/01/15 0340 09/02/15 0621 09/03/15 0451  HGB  --  8.7* 9.1*  --   HCT  --  28.4* 29.1*  --   PLT  --  129* 133*  --   LABPROT  --  33.7* 20.7* 20.9*  INR  --  3.41* 1.78* 1.81*  CREATININE  --  0.71 0.64 0.77  TROPONINI 0.11*  --   --   --     Estimated Creatinine Clearance: 93 mL/min (by C-G formula based on Cr of 0.77).   Medical History: Past Medical History  Diagnosis Date  . Coronary artery disease     Multivessel s/p CABG 1981 at [facility]  . Essential hypertension   . Myocardial infarction ([place]) 1980  . Hematuria   . Peripheral vascular disease ([place])   . 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 TIA (transient ischemic attack) 1989    Chronic coumadin  . Stroke ([place])   . Shortness of breath dyspnea   . Skin cancer     "burned them off my back"  . High cholesterol   . Anginal pain ([place]) <1980  . Pneumonia 1980  . History of blood transfusion 07/2015    w/knee replacement HgB <9  . Arthritis     "all over my body"  . UTI (lower urinary tract infection) "had 4 UTI in 2016"   Assessment: 81yo male with h/o TIA/CVA on coumadin PTA , now with new LV apical thrombus.  Admitted 08/31/15 with respiratory failure and subsequently intubated. On coumadin pta for hx TIA/CVA INR 3.75 on admit and coumadin was held on admit.  Coumadin resumed on 09/02/15.  - ECHO done 5/31 revealed possible new apical thrombus.  INR trend 3.75>3.41>1.78>1.81, now  SUBtherapeutic INR. Today 6/3, cardiologist is adding IV heparin bridge until INR 2-2.5, given  new LV apical thrombus and history of CVA.  SCDs documented on.  Hgb 12.7>8.7>9.1 and PLTC [phone]   PTA  coumadin dose: 10mg  daily. Last dose taken prior to this admission reported as within the past week, thus possibly missed a dose PTA.   Goal of Therapy:  Heparin level = 0.3-0.7 units/ml INR 2-3 Monitor platelets by anticoagulation protocol: Yes   Plan:  Start IV heparin 2000 units IV x1, start heparin drip at 900 units/hr Check 8 hr heparin level Coumadin 7.5 mg tonight Daily INR, heparin level and CBC DC iv heparin when INR =2-2.5 (goal INR 2-3).   Nicole Cella, RPh Clinical Pharmacist Pager: [phone] 09/03/2015,12:19 PM

## 2015-09-04 DIAGNOSIS — E876 Hypokalemia: Secondary | ICD-10-CM

## 2015-09-04 LAB — GLUCOSE, CAPILLARY
GLUCOSE-CAPILLARY: 141 mg/dL — AB (ref 65–99)
GLUCOSE-CAPILLARY: 123 mg/dL — AB (ref 65–99)
Glucose-Capillary: 101 mg/dL — ABNORMAL HIGH (ref 65–99)
Glucose-Capillary: 124 mg/dL — ABNORMAL HIGH (ref 65–99)

## 2015-09-04 LAB — CBC
HCT: 33 % — ABNORMAL LOW (ref 39.0–52.0)
Hemoglobin: 10.1 g/dL — ABNORMAL LOW (ref 13.0–17.0)
MCH: 28.3 pg (ref 26.0–34.0)
MCHC: 30.6 g/dL (ref 30.0–36.0)
MCV: 92.4 fL (ref 78.0–100.0)
PLATELETS: 148 10*3/uL — AB (ref 150–400)
RBC: 3.57 MIL/uL — ABNORMAL LOW (ref 4.22–5.81)
RDW: 14.7 % (ref 11.5–15.5)
WBC: 4.6 10*3/uL (ref 4.0–10.5)

## 2015-09-04 LAB — BASIC METABOLIC PANEL
Anion gap: 7 (ref 5–15)
BUN: 17 mg/dL (ref 6–20)
CALCIUM: 8.9 mg/dL (ref 8.9–10.3)
CO2: 30 mmol/L (ref 22–32)
CREATININE: 0.88 mg/dL (ref 0.61–1.24)
Chloride: 103 mmol/L (ref 101–111)
GFR calc non Af Amer: >60 mL/min (ref >60)
Glucose, Bld: 96 mg/dL (ref 65–99)
Potassium: 3.6 mmol/L (ref 3.5–5.1)
SODIUM: 140 mmol/L (ref 135–145)

## 2015-09-04 LAB — HEPARIN LEVEL (UNFRACTIONATED)
HEPARIN UNFRACTIONATED: 0.15 [IU]/mL — AB (ref 0.30–0.70)
HEPARIN UNFRACTIONATED: 0.34 [IU]/mL (ref 0.30–0.70)
HEPARIN UNFRACTIONATED: 0.37 [IU]/mL (ref 0.30–0.70)

## 2015-09-04 LAB — PROTIME-INR
INR: 1.6 — AB (ref 0.00–1.49)
PROTHROMBIN TIME: 19 s — AB (ref 11.6–15.2)

## 2015-09-04 MED ORDER — HEPARIN BOLUS VIA INFUSION
2000.0000 [IU] | Freq: Once | INTRAVENOUS | Status: AC
  Administered 2015-09-04: 2000 [IU] via INTRAVENOUS
  Filled 2015-09-04: qty 2000

## 2015-09-04 MED ORDER — CARVEDILOL 6.25 MG PO TABS
6.2500 mg | ORAL_TABLET | Freq: Two times a day (BID) | ORAL | Status: DC
  Administered 2015-09-04 – 2015-09-05 (×2): 6.25 mg via ORAL
  Filled 2015-09-04 (×2): qty 1

## 2015-09-04 MED ORDER — WARFARIN SODIUM 5 MG PO TABS
10.0000 mg | ORAL_TABLET | Freq: Once | ORAL | Status: AC
  Administered 2015-09-04: 10 mg via ORAL
  Filled 2015-09-04: qty 2

## 2015-09-04 MED ORDER — POLYETHYLENE GLYCOL 3350 17 G PO PACK
17.0000 g | PACK | Freq: Every day | ORAL | Status: DC
  Administered 2015-09-04 – 2015-09-06 (×3): 17 g via ORAL
  Filled 2015-09-04 (×3): qty 1

## 2015-09-04 NOTE — Consult Note (Addendum)
ANTICOAGULATION CONSULT NOTE - Follow up Pharmacy Consult for heparin/warfarin  Indication: hx TIA/CVA, new apical thrombus  No Known Allergies  Patient Measurements: Height: 5\' 11"  (180.3 cm) Weight: 238 lb 5.1 oz (108.1 kg) IBW/kg (Calculated) : 75.3  Heparin dosing weight: 96.7 kg  Vital Signs: Temp: 97.8 F (36.6 C) (06/04 0452) Temp Source: Oral (06/04 0452) BP: 130/62 mmHg (06/04 0452) Pulse Rate: 94 (06/04 0452)  Labs:  Recent Labs  09/02/15 0621 09/03/15 0451 09/03/15 2104 09/04/15 0649  HGB 9.1*  --   --  10.1*  HCT 29.1*  --   --  33.0*  PLT 133*  --   --  148*  LABPROT 20.7* 20.9*  --  19.0*  INR 1.78* 1.81*  --  1.60*  HEPARINUNFRC  --   --  <0.10* 0.15*  CREATININE 0.64 0.77  --   --     Estimated Creatinine Clearance: 95.2 mL/min (by C-G formula based on Cr of 0.77).   Medical History: Past Medical History  Diagnosis Date  . Coronary artery disease     Multivessel s/p CABG [facility] at [facility]  . Essential hypertension   . Myocardial infarction ([place]) 1980  . Hematuria   . Peripheral vascular disease ([place])   . 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 TIA (transient ischemic attack) 1989    Chronic coumadin  . Stroke ([place])   . Shortness of breath dyspnea   . Skin cancer     "burned them off my back"  . High cholesterol   . Anginal pain ([place]) <1980  . Pneumonia 1980  . History of blood transfusion 07/2015    w/knee replacement HgB <9  . Arthritis     "all over my body"  . UTI (lower urinary tract infection) "had 4 UTI in 2016"   Assessment: 82yo male with h/o TIA/CVA on coumadin PTA , now with new LV apical thrombus on heparin/coumadin. HL now 0.15 after 2000 unit bolus and increase in rate to 1200 units/hr. Received coumadin 7.5mg  x2 days. INR 1.78>1.81. INR today remains subtherapeutic at 1.6. CBC stable.(INR= 1.6)  PTA  coumadin dose: 10mg  daily. Last dose taken prior to this admission reported as within the past week,  thus possibly missed a dose PTA.   Goal of Therapy:  Heparin level = 0.3-0.7 units/ml INR 2-3 Monitor platelets by anticoagulation protocol: Yes   Plan: -Heparin bolus 2000 units then increase to 1500 units/hr -Heparin level in 8 hours and daily wth CBC daily -Warfarin 10mg  x1 -D/C heparin when INR >2 -Daily HL, INR, CBC -Monitor s/sx bleeding.   Stephens November, PharmD Clinical Pharmacy Resident 8:08 AM, 09/04/2015

## 2015-09-04 NOTE — Progress Notes (Signed)
CM spoke with nurse regarding benefits check for Lovenox. Benefits check in process for 100 mg Lovenox BID, CM to f/u with results on Monday. Insurance offices are closed on weekends. Whitman Hero RN,BSN,CM [phone]

## 2015-09-04 NOTE — Progress Notes (Signed)
ANTICOAGULATION CONSULT NOTE - Follow Up Consult  Pharmacy Consult for heparin Indication: h/o TIA/CVA and new apical thrombus  Labs:  Recent Labs  09/02/15 0621 09/03/15 0451  09/04/15 0649 09/04/15 1608 09/04/15 2140  HGB 9.1*  --   --  10.1*  --   --   HCT 29.1*  --   --  33.0*  --   --   PLT 133*  --   --  148*  --   --   LABPROT 20.7* 20.9*  --  19.0*  --   --   INR 1.78* 1.81*  --  1.60*  --   --   HEPARINUNFRC  --   --   < > 0.15* 0.34 0.37  CREATININE 0.64 0.77  --  0.88  --   --   < > = values in this interval not displayed.   Assessment/Plan:  81yo male remains therapeutic on heparin. Will continue gtt at current rate and confirm stable with am labs.   Wynona Neat, PharmD, BCPS  09/04/2015,11:32 PM

## 2015-09-04 NOTE — Progress Notes (Signed)
Benefits checked started for lovenox coverage per Clarise Cruz, SW; however, insurance company not open until Monday so will not know until then,

## 2015-09-04 NOTE — Progress Notes (Signed)
PROGRESS NOTE  Benjamin Cordova  JGO:[id number] DOB: 03-Jul-1934  DOA: 08/31/2015 PCP: Wende Neighbors, MD   Brief Narrative:  81 yo male had Lt TKR in April 2017. He had redo CABG in July 2016. He was in short term rehab, and was discharged. He developed progressive shortness of breath and cough. He went to [facility]. He was found to have gurgling respirations, hypoxia. CXR showed infiltrate, and ABG showed hypercapnia. He was intubated. He was started on IV fluids and Abx for HCAP. He was transferred to [facility] ICU under CCM service. Extubated, off pressors. Stabilized and transferred to floor and [place] service on 6/2. Cardiology consulting.   Assessment & Plan:   Active Problems:   Respiratory failure with hypoxia (HCC)   HCAP (healthcare-associated pneumonia)   Acute respiratory failure with hypoxia (HCC)   Encounter for central line placement   Knee swelling   CHF (congestive heart failure) (HCC)   LV (left ventricular) mural thrombus (HCC)   Systolic and diastolic CHF, acute on chronic (HCC)   Hypokalemia   Acute hypoxic and hypercapnic respiratory failure - Secondary to decompensated CHF - Extubated 6/1. Antibiotics discontinued-not felt to be infectious etiology. Significant improvement with diuresis-continue. - Resolved.  Acute combined systolic and diastolic CHF - S/P VDRF d/t this - 2-D echo 5/31: LVEF 40-45 percent, akinesis of the entire apical myocardium, apical anterior and inferior myocardium, grade 2 diastolic dysfunction and apical thrombus - Diuresed with IV Lasix and - 3.6 L since admission. Lasix changed to by mouth 20 MG daily. - Lisinopril 2.5 MG daily and low-dose carvedilol added. Cardiology follow-up appreciated. His presentation is somewhat concerning for ischemic mediated event hence nuclear stress test obtained 6/3 >-risk.  - Cardiology follow-up appreciated and discussed with Dr. Radford Pax. Stress test findings related to prior MI and no further ischemic workup  recommended.  A. Fib/apical thrombus - Continue Coumadin per pharmacy. INR subtherapeutic at 1.81. IV heparin started until INR >2. INR: 1.6. May be discharged if his insurance covers Lovenox bridging (cannot find out until 6/5).  Essential hypertension - Mildly uncontrolled at times. Low-dose lisinopril and carvedilol started this admission. Better controlled.  Hyperlipidemia - Lipitor increased to 80 MG daily. Follow-up LFTs and fasting lipids in 4 weeks.  Shock - Unclear etiology. Some concern for septic shock initially from HCAP. Empirically treated with vancomycin, Zosyn, pressors and stress dose steroids.  - Rapid resolution, PCT low trend, clinically resolved fast consistent with heart failure, abnormal echo noted >Finally felt not infectious etiology and antibiotics were discontinued. Off of pressors. Taper down steroids to DC. - Blood cultures 2: Negative to date. Urine culture: Enterococcus but urine microscopy not impressive for UTI and in the absence of symptoms, we'll not initiate antibiotics for this.  Hyperglycemia - Good inpatient control  Acute metabolic encephalopathy - Resolved  History of CVA - Continue Coumadin. IV heparin started 6/3.   CAD, s/p CABG - Continue statin. No aspirin given need for Coumadin.  Status post left TKR April 2017 - States that he has follow-up appointment with orthopedics Tuesday next week. PT evaluation. Ambulated with the help of walker at home.  Anemia and thrombocytopenia - Stable.  Hypokalemia - Replaced   DVT prophylaxis: Coumadin per pharmacy. SCDs. IV heparin until INR therapeutic >2. Code Status: Full Family Communication: Discussed with patient. No family at bedside.  Disposition Plan: DC home when medically stable, possibly 6/5 provided can go home on Lovenox.   Consultants:   CCM-signed off   Cardiology  Procedures:  5/31 ETT >> 6/1 5/31 left IJ CVL >> DC'd  Antimicrobials:  5/31 Vancomycin >>6/1 5/31  Zosyn >> 6/1   Subjective: Dyspnea improved. No other complaints reported.  Objective:  Filed Vitals:   09/03/15 1259 09/03/15 2133 09/04/15 0452 09/04/15 0827  BP: 132/71 119/53 130/62 132/64  Pulse: 86 92 94 98  Temp: 98.6 F (37 C) 98 F (36.7 C) 97.8 F (36.6 C)   TempSrc: Oral  Oral   Resp: _0 Height:      Weight:   108.1 kg (238 lb 5.1 oz)   SpO2: 98% 97% 96%     Intake/Output Summary (Last 24 hours) at 09/04/15 1313 Last data filed at 09/04/15 1009  Gross per 24 hour  Intake  656.5 ml  Output   2075 ml  Net -1418.5 ml   Filed Weights   09/02/15 0508 09/03/15 0659 09/04/15 0452  Weight: 109.2 kg (240 lb 11.9 oz) 103.148 kg (227 lb 6.4 oz) 108.1 kg (238 lb 5.1 oz)    Examination:  General exam: Pleasant elderly male lying comfortably supine in bed.  Respiratory system: clear to auscultation except occasional basal crackles. Respiratory effort normal. Telemetry: SR with BBB morphology. Cardiovascular system: S1 & S2 heard, RRR. No JVD, murmurs, rubs, gallops or clicks.  Trace leg edema. Gastrointestinal system: Abdomen is nondistended, soft and nontender. No organomegaly or masses felt. Normal bowel sounds heard. Central nervous system: Alert and oriented. No focal neurological deficits. Extremities: Symmetric 5 x 5 power. left knee TKR surgery site healing well without acute findings.  Skin: No rashes, lesions or ulcers Psychiatry: Judgement and insight appear normal. Mood & affect appropriate.     Data Reviewed: I have personally reviewed following labs and imaging studies  CBC:  Recent Labs Lab 08/31/15 0055 08/31/15 0058 09/01/15 0340 09/02/15 0621 09/04/15 0649  WBC  --  13.3* 5.8 6.2 4.6  NEUTROABS  --  9.7*  --   --   --   HGB 13.6 12.7* 8.7* 9.1* 10.1*  HCT 40.0 40.3 28.4* 29.1* 33.0*  MCV  --  94.4 93.7 92.7 92.4  PLT  --  271 129* 133* 295*   Basic Metabolic Panel:  Recent Labs Lab 08/31/15 0058 08/31/15 1109 08/31/15 1822  09/01/15 0340 09/01/15 1645 09/02/15 0621 09/03/15 0451 09/04/15 0649  NA 138  --   --  141  --  139 140 140  K 4.2  --   --  3.8  --  4.0 3.1* 3.6  CL 106  --   --  108  --  107 103 103  CO2 25  --   --  28  --  _1 GLUCOSE 217*  --   --  120*  --  137* 107* 96  BUN 32*  --   --  25*  --  20 21* 17  CREATININE 1.21  --   --  0.71  --  0.64 0.77 0.88  CALCIUM 8.6*  --   --  8.3*  --  8.8* 9.0 8.9  MG  --  2.0 2.0 2.0 2.0  --   --   --   PHOS  --  3.0 2.6 2.2* 2.3*  --   --   --    GFR: Estimated Creatinine Clearance: 86.5 mL/min (by C-G formula based on Cr of 0.88). Liver Function Tests:  Recent Labs Lab 08/31/15 0058 09/01/15 0340 09/02/15 0621  AST 23 17 22  ALT 16* 13* 16*  ALKPHOS 74 50 52  BILITOT 0.5 0.6 0.8  PROT 7.0 5.1* 5.6*  ALBUMIN 4.1 2.7* 2.9*   No results for input(s): LIPASE, AMYLASE in the last 168 hours. No results for input(s): AMMONIA in the last 168 hours. Coagulation Profile:  Recent Labs Lab 08/31/15 0058 09/01/15 0340 09/02/15 0621 09/03/15 0451 09/04/15 0649  INR 3.75* 3.41* 1.78* 1.81* 1.60*   Cardiac Enzymes:  Recent Labs Lab 08/31/15 0058 08/31/15 1109 08/31/15 1755  TROPONINI 0.03 0.16* 0.11*   BNP (last 3 results) No results for input(s): PROBNP in the last 8760 hours. HbA1C:  Recent Labs  09/02/15 1822  HGBA1C 5.4   CBG:  Recent Labs Lab 09/03/15 1156 09/03/15 1645 09/03/15 2228 09/04/15 0736 09/04/15 1206  GLUCAP 142* 144* 102* 101* 141*   Lipid Profile: No results for input(s): CHOL, HDL, LDLCALC, TRIG, CHOLHDL, LDLDIRECT in the last 72 hours. Thyroid Function Tests:  Recent Labs  09/02/15 1822  TSH 1.363   Anemia Panel: No results for input(s): VITAMINB12, FOLATE, FERRITIN, TIBC, IRON, RETICCTPCT in the last 72 hours.  Sepsis Labs:  Recent Labs Lab 08/31/15 0055 08/31/15 1109 08/31/15 1112 09/01/15 0340  PROCALCITON  --  0.40  --  0.29  LATICACIDVEN 2.67*  --  1.4  --     Recent  Results (from the past 240 hour(s))  Urine culture     Status: Abnormal   Collection Time: 08/31/15 12:38 AM  Result Value Ref Range Status   Specimen Description URINE, CLEAN CATCH  Final   Special Requests NONE  Final   Culture >=100,000 COLONIES/mL ENTEROCOCCUS SPECIES (A)  Final   Report Status 09/02/2015 FINAL  Final   Organism ID, Bacteria ENTEROCOCCUS SPECIES (A)  Final      Susceptibility   Enterococcus species - MIC*    AMPICILLIN <=2 SENSITIVE Sensitive     LEVOFLOXACIN 1 SENSITIVE Sensitive     NITROFURANTOIN <=16 SENSITIVE Sensitive     VANCOMYCIN 2 SENSITIVE Sensitive     * >=100,000 COLONIES/mL ENTEROCOCCUS SPECIES  Blood culture (routine x 2)     Status: None (Preliminary result)   Collection Time: 08/31/15 12:51 AM  Result Value Ref Range Status   Specimen Description BLOOD RIGHT HAND  Final   Special Requests BOTTLES DRAWN AEROBIC AND ANAEROBIC 6CC EACH  Final   Culture NO GROWTH 4 DAYS  Final   Report Status PENDING  Incomplete  Blood culture (routine x 2)     Status: None (Preliminary result)   Collection Time: 08/31/15 12:58 AM  Result Value Ref Range Status   Specimen Description BLOOD LEFT HAND  Final   Special Requests BOTTLES DRAWN AEROBIC AND ANAEROBIC [place] EACH  Final   Culture NO GROWTH 4 DAYS  Final   Report Status PENDING  Incomplete  MRSA PCR Screening     Status: None   Collection Time: 08/31/15  5:16 AM  Result Value Ref Range Status   MRSA by PCR NEGATIVE NEGATIVE Final    Comment:        The GeneXpert MRSA Assay (FDA approved for NASAL specimens only), is one component of a comprehensive MRSA colonization surveillance program. It is not intended to diagnose MRSA infection nor to guide or monitor treatment for MRSA infections.   Culture, respiratory (NON-Expectorated)     Status: None   Collection Time: 08/31/15 11:13 AM  Result Value Ref Range Status   Specimen Description ENDOTRACHEAL  Final   Special Requests NONE  Final   Gram  Stain   Final    MODERATE WBC PRESENT,BOTH PMN AND MONONUCLEAR FEW GRAM POSITIVE COCCI IN PAIRS FEW GRAM VARIABLE ROD    Culture Consistent with normal respiratory flora.  Final   Report Status 09/02/2015 FINAL  Final         Radiology Studies: Nm Myocar Multi W/spect W/wall Motion / Ef  09/03/2015  CLINICAL DATA:  Shortness of breath. EXAM: MYOCARDIAL IMAGING WITH SPECT (REST AND PHARMACOLOGIC-STRESS) GATED LEFT VENTRICULAR WALL MOTION STUDY LEFT VENTRICULAR EJECTION FRACTION TECHNIQUE: Standard myocardial SPECT imaging was performed after resting intravenous injection of 10 mCi Tc-40mtetrofosmin. Subsequently, intravenous infusion of Lexiscan was performed under the supervision of the Cardiology staff. At peak effect of the drug, 30 mCi Tc-962metrofosmin was injected intravenously and standard myocardial SPECT imaging was performed. Quantitative gated imaging was also performed to evaluate left ventricular wall motion, and estimate left ventricular ejection fraction. COMPARISON:  None. FINDINGS: Perfusion: There is a large fixed defect at the apex extending into the anterior and inferior walls compatible with old infarct/scar. Areas of decreased perfusion on the stress images in the adjacent anterior wall and lower septum concerning for peri-infarct ischemia. Wall Motion: Severe diffuse hypokinesia with akinesia in the apex, anterior and inferior walls. Dilated left ventricle. Left Ventricular Ejection Fraction: 26 % End diastolic volume 23106l End systolic volume 16269l IMPRESSION: 1. Large fixed defect at the apex extending into the anterior and inferior walls. Concern for peri-infarct ischemia in the anterior wall and lower septum. 2. Severe diffuse hypokinesia with akinesia in the areas of scar. 3. Left ventricular ejection fraction 26% 4. Non invasive risk stratification*: High risk *2012 Appropriate Use Criteria for Coronary Revascularization Focused Update: J Am Coll Cardiol.  204854;62(7):035-009http://content.onairportbarriers.comspx?articleid=1201161 Electronically Signed   By: KeRolm Baptise.D.   On: 09/03/2015 13:09        Scheduled Meds: . atorvastatin  80 mg Oral q1800  . carvedilol  6.25 mg Oral BID WC  . chlorhexidine gluconate (SAGE KIT)  15 mL Mouth Rinse BID  . furosemide  20 mg Oral Daily  . hydrocortisone sod succinate (SOLU-CORTEF) inj  50 mg Intravenous Daily  . lisinopril  2.5 mg Oral Daily  . warfarin  10 mg Oral ONCE-1800  . Warfarin - Pharmacist Dosing Inpatient   Does not apply q1800   Continuous Infusions: . sodium chloride Stopped (09/01/15 1800)  . heparin 1,500 Units/hr (09/04/15 0817)     LOS: 4 days    Time spent: 15 minutes.    HOSamuel Simmonds Memorial HospitalMD [facility] Pager [phone] 7PM-7AM, please contact night-coverage [email or web address] Password [email or web address] 09/04/2015, 1:13 PM
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
## 2015-09-04 NOTE — Progress Notes (Addendum)
SUBJECTIVE:  No complaints  OBJECTIVE:   Vitals:   Filed Vitals:   09/03/15 1113 09/03/15 1259 09/03/15 2133 09/04/15 0452  BP: 136/68 132/71 119/53 130/62  Pulse: 86 86 92 94  Temp: 98 F (36.7 C) 98.6 F (37 C) 98 F (36.7 C) 97.8 F (36.6 C)  TempSrc:  Oral  Oral  Resp:  17 18 16   Height:      Weight:    238 lb 5.1 oz (108.1 kg)  SpO2: 98% 98% 97% 96%   I&O's:   Intake/Output Summary (Last 24 hours) at 09/04/15 0815 Last data filed at 09/04/15 [id number]  Gross per 24 hour  Intake  536.5 ml  Output   1775 ml  Net -1238.5 ml   TELEMETRY: Reviewed telemetry pt in NSR:     PHYSICAL EXAM General: Well developed, well nourished, in no acute distress Head: Eyes PERRLA, No xanthomas.   Normal cephalic and atramatic  Lungs:   Clear bilaterally to auscultation and percussion. Heart:   HRRR S1 S2 Pulses are 2+ & equal. Abdomen: Bowel sounds are positive, abdomen soft and non-tender without masses Extremities:   No clubbing, cyanosis or edema.  DP +1 Neuro: Alert and oriented X 3. Psych:  Good affect, responds appropriately   LABS: Basic Metabolic Panel:  Recent Labs  09/01/15 1645 09/02/15 0621 09/03/15 0451  NA  --  139 140  K  --  4.0 3.1*  CL  --  107 103  CO2  --  28 30  GLUCOSE  --  137* 107*  BUN  --  20 21*  CREATININE  --  0.64 0.77  CALCIUM  --  8.8* 9.0  MG 2.0  --   --   PHOS 2.3*  --   --    Liver Function Tests:  Recent Labs  09/02/15 0621  AST 22  ALT 16*  ALKPHOS 52  BILITOT 0.8  PROT 5.6*  ALBUMIN 2.9*   No results for input(s): LIPASE, AMYLASE in the last 72 hours. CBC:  Recent Labs  09/02/15 0621 09/04/15 0649  WBC 6.2 4.6  HGB 9.1* 10.1*  HCT 29.1* 33.0*  MCV 92.7 92.4  PLT 133* 148*   Cardiac Enzymes: No results for input(s): CKTOTAL, CKMB, CKMBINDEX, TROPONINI in the last 72 hours. BNP: Invalid input(s): POCBNP D-Dimer: No results for input(s): DDIMER in the last 72 hours. Hemoglobin A1C:  Recent Labs  09/02/15 1822  HGBA1C 5.4   Fasting Lipid Panel: No results for input(s): CHOL, HDL, LDLCALC, TRIG, CHOLHDL, LDLDIRECT in the last 72 hours. Thyroid Function Tests:  Recent Labs  09/02/15 1822  TSH 1.363   Anemia Panel: No results for input(s): VITAMINB12, FOLATE, FERRITIN, TIBC, IRON, RETICCTPCT in the last 72 hours. Coag Panel:   Lab Results  Component Value Date   INR 1.60* 09/04/2015   INR 1.81* 09/03/2015   INR 1.78* 09/02/2015    RADIOLOGY: Nm Myocar Multi W/spect W/wall Motion / Ef  09/03/2015  CLINICAL DATA:  Shortness of breath. EXAM: MYOCARDIAL IMAGING WITH SPECT (REST AND PHARMACOLOGIC-STRESS) GATED LEFT VENTRICULAR WALL MOTION STUDY LEFT VENTRICULAR EJECTION FRACTION TECHNIQUE: Standard myocardial SPECT imaging was performed after resting intravenous injection of 10 mCi Tc-42m tetrofosmin. Subsequently, intravenous infusion of Lexiscan was performed under the supervision of the Cardiology staff. At peak effect of the drug, 30 mCi Tc-68m tetrofosmin was injected intravenously and standard myocardial SPECT imaging was performed. Quantitative gated imaging was also performed to evaluate left ventricular wall motion, and estimate  left ventricular ejection fraction. COMPARISON:  None. FINDINGS: Perfusion: There is a large fixed defect at the apex extending into the anterior and inferior walls compatible with old infarct/scar. Areas of decreased perfusion on the stress images in the adjacent anterior wall and lower septum concerning for peri-infarct ischemia. Wall Motion: Severe diffuse hypokinesia with akinesia in the apex, anterior and inferior walls. Dilated left ventricle. Left Ventricular Ejection Fraction: 26 % End diastolic volume 123456 ml End systolic volume 123XX123 ml IMPRESSION: 1. Large fixed defect at the apex extending into the anterior and inferior walls. Concern for peri-infarct ischemia in the anterior wall and lower septum. 2. Severe diffuse hypokinesia with akinesia in the  areas of scar. 3. Left ventricular ejection fraction 26% 4. Non invasive risk stratification*: High risk *2012 Appropriate Use Criteria for Coronary Revascularization Focused Update: J Am Coll Cardiol. N6492421. http://content.airportbarriers.com.aspx?articleid=1201161 Electronically Signed   By: Rolm Baptise M.D.   On: 09/03/2015 13:09   Dg Chest Port 1 View  09/01/2015  CLINICAL DATA: Pneumonia. EXAM: PORTABLE CHEST 1 VIEW COMPARISON:  08/31/2015. FINDINGS: Endotracheal tube, NG tube, left IJ line stable position. Prior CABG. Stable cardiomegaly. Continued interval clearing of bilateral pulmonary infiltrates/edema. Small left pleural effusion. No pneumothorax. IMPRESSION: 1. Lines and tubes in stable position. 2. Prior CABG. Continued interval clearing of bilateral pulmonary infiltrates consistent with clearing pulmonary edema. Small left pleural effusion. Electronically Signed   By: Marcello Moores  Register   On: 09/01/2015 07:26   Dg Chest Port 1 View  08/31/2015  CLINICAL DATA:  Hypoxia.  Central catheter placed EXAM: PORTABLE CHEST 1 VIEW COMPARISON:  Study obtained earlier in the day FINDINGS: Endotracheal tube tip is 5.8 cm above carina. Central catheter tip is in the superior vena cava near the cavoatrial junction. Nasogastric tube tip and side port are below the diaphragm. No pneumothorax. There is less airspace consolidation compared to earlier in the day. There is patchy airspace disease currently with underlying interstitial edema and bilateral pleural effusions. There is cardiomegaly with mild pulmonary venous hypertension. No new opacity. No adenopathy evident. IMPRESSION: Tube and catheter positions as described without pneumothorax. Evidence a degree of congestive heart failure. Considerably less alveolar consolidation is noted compared to earlier in the day. No new opacity currently. Electronically Signed   By: Lowella Grip III M.D.   On: 08/31/2015 08:00   Dg Chest Portable 1  View  08/31/2015  CLINICAL DATA:  Endotracheal tube placement.  Shortness of breath. EXAM: PORTABLE CHEST 1 VIEW COMPARISON:  12/15/2014 FINDINGS: Endotracheal tube 5.9 cm from the carina. Patient is post median sternotomy. Heart at the upper limits normal in size. Right greater than left diffuse opacities, primarily perihilar. Suspect small pleural effusions and fluid in the right minor fissure. No evidence of pneumothorax. IMPRESSION: 1. Endotracheal tube 5.9 cm from the carina. 2. Diffuse right greater than left opacities, may be pulmonary edema, aspiration or pneumonia. Suspect small pleural effusions. Electronically Signed   By: Jeb Levering M.D.   On: 08/31/2015 00:38   Dg Knee Left Port  08/31/2015  CLINICAL DATA:  Left knee swelling and tenderness. Status post left knee replacement. EXAM: PORTABLE LEFT KNEE - 1-2 VIEW COMPARISON:  07/27/2015 FINDINGS: No fracture. No bone lesion. Femoral tibial prosthetic components appear well seated and aligned without evidence of loosening. There is a large joint effusion. Subcutaneous edema is seen diffusely. IMPRESSION: 1. No fracture or bone lesion. 2. No evidence of prosthetic loosening. 3. Large joint effusion and nonspecific soft tissue edema. Electronically  Signed   By: Lajean Manes M.D.   On: 08/31/2015 08:23   Dg Abd Portable 1v  08/31/2015  CLINICAL DATA:  Nasogastric tube placement EXAM: PORTABLE ABDOMEN - 1 VIEW COMPARISON:  CT abdomen and pelvis September 13, 2010 FINDINGS: Nasogastric tube tip and side port are in the stomach with the nasogastric tube tip near the pylorus. There is diffuse stool throughout colon. There is no bowel dilatation or air-fluid level suggesting obstruction. No free air. IMPRESSION: Nasogastric tube tip and side port in stomach. Overall bowel gas pattern unremarkable. Diffuse stool noted in colon. Electronically Signed   By: Lowella Grip III M.D.   On: 08/31/2015 08:23    Assessment & Plan   1. Acute combined  systolic/diastolic congestive heart failure - Echo shows EF of 40-45%. Appears euvolemic on exam.  - Has received IV diuresis while admitted. Now on Lasix 20mg  daily. May require further titration as an outpatient.  - started on Lisinopril 2.5mg  daily and Coreg.  Increase Coreg today to 6.25mg  daily.  Titrate ACE and BB as BP allows. - Reviewed nuclear stress test images.  Patient has a large apical anterior, apical inferior and apical infarct which is essentially fixed with no significant ischemia (the polar plot shows peri infarct ischemia which was read out by radiology but review of images shows fixed defect).  No further ischemic assessment at this time.  Patient can be discharged from a cardiology perspective with close outpatient follow-up once INR > 2-2.5.  INR 1.6 today.   - check BMET with low potassium yesterday from diuresis.    2. LV apical thrombus - continue Coumadin. INR subtherapeutic and Heparin started given history of CVA.  Goal is INR > 2.0-2.5 before stopping Heparin.  He is anxious to go home so could consider Lovenox bridge.  3. Coronary artery disease -continue statin. No aspirin given need for Coumadin.  4. Hyperlipidemia  - given documented coronary disease, Lipitor increased to 80 mg daily. Check lipids and liver in 4 weeks.  5. History of CVA -continue Coumadin.         Fransico Him, MD  09/04/2015  8:15 AM

## 2015-09-05 DIAGNOSIS — J9621 Acute and chronic respiratory failure with hypoxia: Secondary | ICD-10-CM | POA: Insufficient documentation

## 2015-09-05 DIAGNOSIS — J9622 Acute and chronic respiratory failure with hypercapnia: Secondary | ICD-10-CM

## 2015-09-05 DIAGNOSIS — I5043 Acute on chronic combined systolic (congestive) and diastolic (congestive) heart failure: Secondary | ICD-10-CM

## 2015-09-05 DIAGNOSIS — R000 Tachycardia, unspecified: Secondary | ICD-10-CM

## 2015-09-05 DIAGNOSIS — I5023 Acute on chronic systolic (congestive) heart failure: Secondary | ICD-10-CM | POA: Insufficient documentation

## 2015-09-05 LAB — CBC
HCT: 31.9 % — ABNORMAL LOW (ref 39.0–52.0)
Hemoglobin: 10 g/dL — ABNORMAL LOW (ref 13.0–17.0)
MCH: 28.7 pg (ref 26.0–34.0)
MCHC: 31.3 g/dL (ref 30.0–36.0)
MCV: 91.4 fL (ref 78.0–100.0)
Platelets: 163 10*3/uL (ref 150–400)
RBC: 3.49 MIL/uL — ABNORMAL LOW (ref 4.22–5.81)
RDW: 14.7 % (ref 11.5–15.5)
WBC: 5.5 10*3/uL (ref 4.0–10.5)

## 2015-09-05 LAB — CULTURE, BLOOD (ROUTINE X 2)
CULTURE: NO GROWTH
CULTURE: NO GROWTH

## 2015-09-05 LAB — PROTIME-INR
INR: 1.49 (ref 0.00–1.49)
PROTHROMBIN TIME: 18.1 s — AB (ref 11.6–15.2)

## 2015-09-05 LAB — GLUCOSE, CAPILLARY
Glucose-Capillary: 102 mg/dL — ABNORMAL HIGH (ref 65–99)
Glucose-Capillary: 153 mg/dL — ABNORMAL HIGH (ref 65–99)
Glucose-Capillary: 134 mg/dL — ABNORMAL HIGH (ref 65–99)
Glucose-Capillary: 126 mg/dL — ABNORMAL HIGH (ref 65–99)

## 2015-09-05 LAB — HEPARIN LEVEL (UNFRACTIONATED): HEPARIN UNFRACTIONATED: 0.3 [IU]/mL (ref 0.30–0.70)

## 2015-09-05 MED ORDER — POTASSIUM CHLORIDE CRYS ER 20 MEQ PO TBCR
40.0000 meq | EXTENDED_RELEASE_TABLET | Freq: Once | ORAL | Status: AC
  Administered 2015-09-05: 40 meq via ORAL
  Filled 2015-09-05: qty 2

## 2015-09-05 MED ORDER — CARVEDILOL 12.5 MG PO TABS
12.5000 mg | ORAL_TABLET | Freq: Two times a day (BID) | ORAL | Status: DC
  Administered 2015-09-05 – 2015-09-06 (×2): 12.5 mg via ORAL
  Filled 2015-09-05 (×2): qty 1

## 2015-09-05 MED ORDER — WARFARIN SODIUM 5 MG PO TABS
10.0000 mg | ORAL_TABLET | Freq: Once | ORAL | Status: AC
  Administered 2015-09-05: 10 mg via ORAL
  Filled 2015-09-05: qty 2

## 2015-09-05 MED ORDER — LISINOPRIL 5 MG PO TABS
5.0000 mg | ORAL_TABLET | Freq: Every day | ORAL | Status: DC
Start: 2015-09-06 — End: 2015-09-06
  Administered 2015-09-06: 5 mg via ORAL
  Filled 2015-09-05: qty 1

## 2015-09-05 NOTE — Care Management Important Message (Signed)
Important Message  Patient Details  Name: Benjamin Cordova MRN: [id number] Date of Birth: [birth year]   Medicare Important Message Given:  Yes    Nathen May 09/05/2015, 12:24 PM

## 2015-09-05 NOTE — Progress Notes (Signed)
PROGRESS NOTE  Benjamin Cordova  KZL:[id number] DOB: October 21, 1934  DOA: 08/31/2015 PCP: Wende Neighbors, MD   Brief Narrative:  81 yo male had Lt TKR in April 2017. He had redo CABG in July 2016. He was in short term rehab, and was discharged. He developed progressive shortness of breath and cough. He went to [facility]. He was found to have gurgling respirations, hypoxia. CXR showed infiltrate, and ABG showed hypercapnia. He was intubated. He was started on IV fluids and Abx for HCAP. He was transferred to [facility] ICU under CCM service. Extubated, off pressors. Stabilized and transferred to floor and [place] service on 6/2. Cardiology consulting.   Assessment & Plan:   Active Problems:   Respiratory failure with hypoxia (HCC)   HCAP (healthcare-associated pneumonia)   Acute respiratory failure with hypoxia (HCC)   Encounter for central line placement   Knee swelling   CHF (congestive heart failure) (HCC)   LV (left ventricular) mural thrombus (HCC)   Systolic and diastolic CHF, acute on chronic (HCC)   Hypokalemia   Acute on chronic respiratory failure with hypoxia and hypercapnia (HCC)   Acute on chronic combined systolic and diastolic CHF (congestive heart failure) (HCC)   Acute hypoxic and hypercapnic respiratory failure - Secondary to decompensated CHF - Extubated 6/1. Antibiotics discontinued-not felt to be infectious etiology. Significant improvement with diuresis-continue. - Resolved.  Acute combined systolic and diastolic CHF - S/P VDRF d/t this - 2-D echo 5/31: LVEF 40-45 percent, akinesis of the entire apical myocardium, apical anterior and inferior myocardium, grade 2 diastolic dysfunction and apical thrombus - Diuresed with IV Lasix and - 3.6 L since admission. Lasix changed to by mouth 20 MG daily. - Lisinopril 2.5 MG daily and low-dose carvedilol were added.   - Cardiology follow-up appreciated and discussed with Dr. Radford Pax. Stress test findings related to prior MI and no further  ischemic workup recommended. - Cardiology follow-up appreciated. Titrating up medications. Lisinopril increased to 5 MG daily and carvedilol to 12.5 MG twice a day. Discussed with Dr. Claiborne Billings who recommends monitoring patient overnight prior to discharge in a.m.  A. Fib/apical thrombus - Continue Coumadin per pharmacy. INR subtherapeutic at 1.49. IV heparin started until INR >2. INR is actually steadily dropped over the last 3 days, Coumadin dosing per pharmacy. Case management as able to assist with Lovenox at discharge.  Essential hypertension - Mildly uncontrolled at times. Low-dose lisinopril and carvedilol started this admission and titrated up as indicated above. Better controlled.  Hyperlipidemia - Lipitor increased to 80 MG daily. Follow-up LFTs and fasting lipids in 4 weeks.  Shock - Unclear etiology. Some concern for septic shock initially from HCAP. Empirically treated with vancomycin, Zosyn, pressors and stress dose steroids.  - Rapid resolution, PCT low trend, clinically resolved fast consistent with heart failure, abnormal echo noted >Finally felt not infectious etiology and antibiotics were discontinued. Off of pressors. Taper down steroids to DC. - Blood cultures 2: Negative to date. Urine culture: Enterococcus but urine microscopy not impressive for UTI and in the absence of symptoms, we'll not initiate antibiotics for this.  Hyperglycemia - Good inpatient control  Acute metabolic encephalopathy - Resolved  History of CVA - Continue Coumadin. IV heparin started 6/3.   CAD, s/p CABG - Continue statin. No aspirin given need for Coumadin.  Status post left TKR April 2017 - States that he has follow-up appointment with orthopedics Tuesday next week. PT evaluation. Ambulated with the help of walker at home.  Anemia and thrombocytopenia -  Stable.  Hypokalemia - Replace and try to keep potassium greater than or equal to 4.  Asymptomatic bacteriuria  - Urine culture  shows enterococcus but in the absence of symptoms, no antibiotics initiated for this.    DVT prophylaxis: Coumadin per pharmacy. SCDs. IV heparin until INR therapeutic >2. Code Status: Full Family Communication: Discussed with patient. No family at bedside.  Disposition Plan: DC home when medically stable, possibly 6/6.   Consultants:   CCM-signed off   Cardiology  Procedures:  5/31 ETT >> 6/1 5/31 left IJ CVL >> DC'd  Antimicrobials:  5/31 Vancomycin >>6/1 5/31 Zosyn >> 6/1   Subjective: Dyspnea improved. No other complaints reported. Eager to go home. States that he ambulated with PT.  Objective:  Filed Vitals:   09/04/15 2159 09/05/15 0630 09/05/15 1046 09/05/15 1405  BP: 114/91 132/67 118/59 125/69  Pulse: 102 99 100 103  Temp:  98.2 F (36.8 C)  98.2 F (36.8 C)  TempSrc:  Oral  Oral  Resp: '18 16 20 18  ' Height:      Weight:  107.4 kg (236 lb 12.4 oz)    SpO2: 99% 95% 96% 95%    Intake/Output Summary (Last 24 hours) at 09/05/15 1627 Last data filed at 09/05/15 1427  Gross per 24 hour  Intake 1212.8 ml  Output   2000 ml  Net -787.2 ml   Filed Weights   09/03/15 0659 09/04/15 0452 09/05/15 0630  Weight: 103.148 kg (227 lb 6.4 oz) 108.1 kg (238 lb 5.1 oz) 107.4 kg (236 lb 12.4 oz)    Examination:  General exam: Pleasant elderly male lying comfortably supine in bed.  Respiratory system: clear to auscultation except occasional basal crackles. Respiratory effort normal. Telemetry: SR with BBB morphology.21 beatNSSVT on 6/4 at 1:30 PM Cardiovascular system: S1 & S2 heard, RRR. No JVD, murmurs, rubs, gallops or clicks.  Trace leg edema. Gastrointestinal system: Abdomen is nondistended, soft and nontender. No organomegaly or masses felt. Normal bowel sounds heard. Central nervous system: Alert and oriented. No focal neurological deficits. Extremities: Symmetric 5 x 5 power. left knee TKR surgery site healing well without acute findings.  Skin: No rashes,  lesions or ulcers Psychiatry: Judgement and insight appear normal. Mood & affect appropriate.     Data Reviewed: I have personally reviewed following labs and imaging studies  CBC:  Recent Labs Lab 08/31/15 0058 09/01/15 0340 09/02/15 0621 09/04/15 0649 09/05/15 0544  WBC 13.3* 5.8 6.2 4.6 5.5  NEUTROABS 9.7*  --   --   --   --   HGB 12.7* 8.7* 9.1* 10.1* 10.0*  HCT 40.3 28.4* 29.1* 33.0* 31.9*  MCV 94.4 93.7 92.7 92.4 91.4  PLT 271 129* 133* 148* 678   Basic Metabolic Panel:  Recent Labs Lab 08/31/15 0058 08/31/15 1109 08/31/15 1822 09/01/15 0340 09/01/15 1645 09/02/15 0621 09/03/15 0451 09/04/15 0649  NA 138  --   --  141  --  139 140 140  K 4.2  --   --  3.8  --  4.0 3.1* 3.6  CL 106  --   --  108  --  107 103 103  CO2 25  --   --  28  --  '28 30 30  ' GLUCOSE 217*  --   --  120*  --  137* 107* 96  BUN 32*  --   --  25*  --  20 21* 17  CREATININE 1.21  --   --  0.71  --  0.64 0.77 0.88  CALCIUM 8.6*  --   --  8.3*  --  8.8* 9.0 8.9  MG  --  2.0 2.0 2.0 2.0  --   --   --   PHOS  --  3.0 2.6 2.2* 2.3*  --   --   --    GFR: Estimated Creatinine Clearance: 86.2 mL/min (by C-G formula based on Cr of 0.88). Liver Function Tests:  Recent Labs Lab 08/31/15 0058 09/01/15 0340 09/02/15 0621  AST '23 17 22  ' ALT 16* 13* 16*  ALKPHOS 74 50 52  BILITOT 0.5 0.6 0.8  PROT 7.0 5.1* 5.6*  ALBUMIN 4.1 2.7* 2.9*   No results for input(s): LIPASE, AMYLASE in the last 168 hours. No results for input(s): AMMONIA in the last 168 hours. Coagulation Profile:  Recent Labs Lab 09/01/15 0340 09/02/15 0621 09/03/15 0451 09/04/15 0649 09/05/15 0544  INR 3.41* 1.78* 1.81* 1.60* 1.49   Cardiac Enzymes:  Recent Labs Lab 08/31/15 0058 08/31/15 1109 08/31/15 1755  TROPONINI 0.03 0.16* 0.11*   BNP (last 3 results) No results for input(s): PROBNP in the last 8760 hours. HbA1C:  Recent Labs  09/02/15 1822  HGBA1C 5.4   CBG:  Recent Labs Lab 09/04/15 1206  09/04/15 1631 09/04/15 2202 09/05/15 0811 09/05/15 1316  GLUCAP 141* 124* 123* 102* 153*   Lipid Profile: No results for input(s): CHOL, HDL, LDLCALC, TRIG, CHOLHDL, LDLDIRECT in the last 72 hours. Thyroid Function Tests:  Recent Labs  09/02/15 1822  TSH 1.363   Anemia Panel: No results for input(s): VITAMINB12, FOLATE, FERRITIN, TIBC, IRON, RETICCTPCT in the last 72 hours.  Sepsis Labs:  Recent Labs Lab 08/31/15 0055 08/31/15 1109 08/31/15 1112 09/01/15 0340  PROCALCITON  --  0.40  --  0.29  LATICACIDVEN 2.67*  --  1.4  --     Recent Results (from the past 240 hour(s))  Urine culture     Status: Abnormal   Collection Time: 08/31/15 12:38 AM  Result Value Ref Range Status   Specimen Description URINE, CLEAN CATCH  Final   Special Requests NONE  Final   Culture >=100,000 COLONIES/mL ENTEROCOCCUS SPECIES (A)  Final   Report Status 09/02/2015 FINAL  Final   Organism ID, Bacteria ENTEROCOCCUS SPECIES (A)  Final      Susceptibility   Enterococcus species - MIC*    AMPICILLIN <=2 SENSITIVE Sensitive     LEVOFLOXACIN 1 SENSITIVE Sensitive     NITROFURANTOIN <=16 SENSITIVE Sensitive     VANCOMYCIN 2 SENSITIVE Sensitive     * >=100,000 COLONIES/mL ENTEROCOCCUS SPECIES  Blood culture (routine x 2)     Status: None   Collection Time: 08/31/15 12:51 AM  Result Value Ref Range Status   Specimen Description BLOOD RIGHT HAND  Final   Special Requests BOTTLES DRAWN AEROBIC AND ANAEROBIC [facility] EACH  Final   Culture NO GROWTH 5 DAYS  Final   Report Status 09/05/2015 FINAL  Final  Blood culture (routine x 2)     Status: None   Collection Time: 08/31/15 12:58 AM  Result Value Ref Range Status   Specimen Description BLOOD LEFT HAND  Final   Special Requests BOTTLES DRAWN AEROBIC AND ANAEROBIC [facility] EACH  Final   Culture NO GROWTH 5 DAYS  Final   Report Status 09/05/2015 FINAL  Final  MRSA PCR Screening     Status: None   Collection Time: 08/31/15  5:16 AM  Result Value Ref Range  Status   MRSA by  PCR NEGATIVE NEGATIVE Final    Comment:        The GeneXpert MRSA Assay (FDA approved for NASAL specimens only), is one component of a comprehensive MRSA colonization surveillance program. It is not intended to diagnose MRSA infection nor to guide or monitor treatment for MRSA infections.   Culture, respiratory (NON-Expectorated)     Status: None   Collection Time: 08/31/15 11:13 AM  Result Value Ref Range Status   Specimen Description ENDOTRACHEAL  Final   Special Requests NONE  Final   Gram Stain   Final    MODERATE WBC PRESENT,BOTH PMN AND MONONUCLEAR FEW GRAM POSITIVE COCCI IN PAIRS FEW GRAM VARIABLE ROD    Culture Consistent with normal respiratory flora.  Final   Report Status 09/02/2015 FINAL  Final         Radiology Studies: No results found.      Scheduled Meds: . atorvastatin  80 mg Oral q1800  . carvedilol  6.25 mg Oral BID WC  . chlorhexidine gluconate (SAGE KIT)  15 mL Mouth Rinse BID  . furosemide  20 mg Oral Daily  . hydrocortisone sod succinate (SOLU-CORTEF) inj  50 mg Intravenous Daily  . lisinopril  2.5 mg Oral Daily  . polyethylene glycol  17 g Oral Daily  . potassium chloride  40 mEq Oral Once  . warfarin  10 mg Oral ONCE-1800  . Warfarin - Pharmacist Dosing Inpatient   Does not apply q1800   Continuous Infusions: . sodium chloride Stopped (09/01/15 1800)  . heparin 1,600 Units/hr (09/05/15 1526)     LOS: 5 days    Time spent: 15 minutes.    [facility], MD [facility] Pager [phone]  If 7PM-7AM, please contact night-coverage [email or web address] Password [facility] 09/05/2015, 4:27 PM

## 2015-09-05 NOTE — Progress Notes (Signed)
Physical Therapy Treatment Patient Details Name: Benjamin Cordova MRN: [id number] DOB: 10/19/1934 Today's Date: 09/05/2015    History of Present Illness Pt is 81 yo male with h/o  left TKA  April 2017,  MI, CVA, polio age 25, affected RLE, OA, CABG admitted with respiratory failure.     PT Comments    Patient progressing toward PT goals. Min A for stairs and supervision for ambulation. Current plan remains appropriate.   Follow Up Recommendations  Home health PT     Equipment Recommendations  None recommended by PT    Recommendations for Other Services       Precautions / Restrictions Precautions Precautions: Knee;Fall Restrictions Weight Bearing Restrictions: No    Mobility  Bed Mobility Overal bed mobility: Independent                Transfers Overall transfer level: Independent Equipment used: Rolling walker (2 wheeled)                Ambulation/Gait Ambulation/Gait assistance: Supervision Ambulation Distance (Feet): 175 Feet Assistive device: Rolling walker (2 wheeled) Gait Pattern/deviations: Step-through pattern;Decreased step length - left;Decreased stride length;Trunk flexed     General Gait Details: cues for posture and position of RW;pt with decreased L heel strike due to pain in L heel   Stairs Stairs: Yes Stairs assistance: Min assist Stair Management: Two rails;Forwards Number of Stairs: 3 General stair comments: educated on sequencing and technique; unsteady when descending requiring min  A to gain balance on second step  Wheelchair Mobility    Modified Rankin (Stroke Patients Only)       Balance Overall balance assessment: Needs assistance Sitting-balance support: No upper extremity supported;Feet supported Sitting balance-Leahy Scale: Good     Standing balance support: Bilateral upper extremity supported Standing balance-Leahy Scale: Poor                      Cognition Arousal/Alertness: Awake/alert Behavior  During Therapy: WFL for tasks assessed/performed Overall Cognitive Status: Within Functional Limits for tasks assessed                      Exercises Total Joint Exercises Straight Leg Raises: Strengthening;Left;20 reps;Supine Knee Flexion: Strengthening;Left;15 reps;Standing Marching in Standing: Strengthening;Both;15 reps;Standing    General Comments        Pertinent Vitals/Pain Pain Assessment: Faces Faces Pain Scale: Hurts little more Pain Location: L knee and L heel Pain Descriptors / Indicators: Sore;Guarding Pain Intervention(s): Limited activity within patient's tolerance;Monitored during session;Repositioned    Home Living                      Prior Function            PT Goals (current goals can now be found in the care plan section) Acute Rehab PT Goals Patient Stated Goal: go home Progress towards PT goals: Progressing toward goals    Frequency  Min 3X/week    PT Plan Current plan remains appropriate    Co-evaluation             End of Session Equipment Utilized During Treatment: Gait belt Activity Tolerance: Patient tolerated treatment well Patient left: in bed;with call bell/phone within reach;with family/visitor present     Time: [id number] PT Time Calculation (min) (ACUTE ONLY): 30 min  Charges:  $Gait Training: 8-22 mins $Therapeutic Exercise: 8-22 mins  G Codes:      [place] April, PTA Pager: [phone]   09/05/2015, 3:48 PM

## 2015-09-05 NOTE — Progress Notes (Signed)
09/05/15 1700  Patient drank 4oz of prune juice to help with constipation.

## 2015-09-05 NOTE — Care Management Note (Addendum)
Case Management Note  Patient Details  Name: Benjamin Cordova MRN: [id number] Date of Birth: April 29, 1934  Subjective/Objective:                 Spoke with patient at the bedside. He states he lives at home with his wife, has a walker and is active with [place] [facility]. Referral made to Loura Back for [facility] PT. Awaiting benefit check for Lovenox. Patient states that he has been on it before at home and knows how to give shots.    Action/Plan:  CM will continue to follow.  Expected Discharge Date:                  Expected Discharge Plan:  [place]  In-House Referral:     Discharge planning Services  CM Consult  Post Acute Care Choice:  Home Health Choice offered to:  Patient  DME Arranged:    DME Agency:     HH Arranged:  PT HH Agency:  [facility]  Status of Service:  Completed, signed off  Medicare Important Message Given:  Yes Date Medicare IM Given:    Medicare IM give by:    Date Additional Medicare IM Given:    Additional Medicare Important Message give by:     If discussed at [place] of Stay Meetings, dates discussed:    Additional Comments:  Carles Collet, RN 09/05/2015, 1:57 PM

## 2015-09-05 NOTE — Progress Notes (Signed)
S/W EBONY @ [employer] [facility] # [phone]   LOVENOX 100 MG BID (30 )   COVER- NO  PRIOR APPROVAL - YES # [phone]   ENOXAPARIN 100 MG BID (30 )   COVER- YES  CO-PAY-$45.94  TIER- 4 DRUG  PRIOR APPROVAL- NO  PHARMACY : [facility] AND [place] [facility]

## 2015-09-05 NOTE — Progress Notes (Addendum)
Subjective:  No chest pain  Objective:   Vital Signs : Filed Vitals:   09/04/15 1438 09/04/15 2159 09/05/15 0630 09/05/15 1046  BP: 138/74 114/91 132/67 118/59  Pulse: 104 102 99 100  Temp: 98.2 F (36.8 C)  98.2 F (36.8 C)   TempSrc: Oral  Oral   Resp: _0 Height:      Weight:   236 lb 12.4 oz (107.4 kg)   SpO2: 94% 99% 95% 96%    Intake/Output from previous day:  Intake/Output Summary (Last 24 hours) at 09/05/15 1159 Last data filed at 09/05/15 0857  Gross per 24 hour  Intake 1452.8 ml  Output   1655 ml  Net -202.2 ml    I/O since admission:  Wt Readings from Last 3 Encounters:  09/05/15 236 lb 12.4 oz (107.4 kg)  08/08/15 242 lb (109.77 kg)  08/05/15 242 lb (109.77 kg)    Medications: . atorvastatin  80 mg Oral q1800  . carvedilol  6.25 mg Oral BID WC  . chlorhexidine gluconate (SAGE KIT)  15 mL Mouth Rinse BID  . furosemide  20 mg Oral Daily  . hydrocortisone sod succinate (SOLU-CORTEF) inj  50 mg Intravenous Daily  . lisinopril  2.5 mg Oral Daily  . polyethylene glycol  17 g Oral Daily  . Warfarin - Pharmacist Dosing Inpatient   Does not apply q1800    . sodium chloride Stopped (09/01/15 1800)  . heparin 1,500 Units/hr (09/04/15 2159)    Physical Exam:   General appearance: alert, cooperative and no distress Neck: no adenopathy, no carotid bruit, no JVD, supple, symmetrical, trachea midline and thyroid not enlarged, symmetric, no tenderness/mass/nodules Lungs: decreased BS at bases Heart: Tachycardic and regular; 1/6 SEM Abdomen: soft, non-tender; bowel sounds normal; no masses,  no organomegaly Extremities: no edema, redness or tenderness in the calves or thighs Pulses: 2+ and symmetric Skin: Skin color, texture, turgor normal. No rashes or lesions Neurologic: Grossly normal   Rate: 108  Rhythm: sinus tachycardia  ECG (independently read by me):  Lab Results:   Recent Labs  09/03/15 0451 09/04/15 0649  NA 140 140  K  3.1* 3.6  CL 103 103  CO2 30 30  GLUCOSE 107* 96  BUN 21* 17  CREATININE 0.77 0.88  CALCIUM 9.0 8.9    Hepatic Function Latest Ref Rng 09/02/2015 09/01/2015 08/31/2015  Total Protein 6.5 - 8.1 g/dL 5.6(L) 5.1(L) 7.0  Albumin 3.5 - 5.0 g/dL 2.9(L) 2.7(L) 4.1  AST 15 - 41 U/L _1 ALT 17 - 63 U/L 16(L) 13(L) 16(L)  Alk Phosphatase 38 - 126 U/L 52 50 74  Total Bilirubin 0.3 - 1.2 mg/dL 0.8 0.6 0.5     Recent Labs  09/04/15 0649 09/05/15 0544  WBC 4.6 5.5  HGB 10.1* 10.0*  HCT 33.0* 31.9*  MCV 92.4 91.4  PLT 148* 163    No results for input(s): TROPONINI in the last 72 hours.  Invalid input(s): CK, MB  Lab Results  Component Value Date   TSH 1.363 09/02/2015    Recent Labs  09/02/15 1822  HGBA1C 5.4    No results for input(s): PROT, ALBUMIN, AST, ALT, ALKPHOS, BILITOT, BILIDIR, IBILI in the last 72 hours.  Recent Labs  09/05/15 0544  INR 1.49   BNP (last 3 results)  Recent Labs  08/31/15 0058  BNP 349.0*    ProBNP (last 3 results) No results for input(s): PROBNP in the last 8760 hours.  Lipid Panel     Component Value Date/Time   CHOL 118* 03/05/2015 0834   TRIG 104 03/05/2015 0834   HDL 39* 03/05/2015 0834   CHOLHDL 3.0 03/05/2015 0834   VLDL 21 03/05/2015 0834   LDLCALC 58 03/05/2015 0834      Imaging:  No results found.  ------------------------------------------------------------------- 08/31/15 ECHO Study Conclusions  - Left ventricle: The cavity size was normal. Systolic function was  mildly to moderately reduced. The estimated ejection fraction was  in the range of 40% to 45%. There is akinesis of the entireapical  myocardium. There is akinesis of the apicalanterior and inferior  myocardium. Features are consistent with a pseudonormal left  ventricular filling pattern, with concomitant abnormal relaxation  and increased filling pressure (grade 2 diastolic dysfunction).  Acoustic contrast opacification revealed a  possible,  apicalthrombus. - Mitral valve: There was mild regurgitation. - Pericardium, extracardiac: There was a left pleural effusion.  Assessment/Plan:   Active Problems:   Respiratory failure with hypoxia (HCC)   HCAP (healthcare-associated pneumonia)   Acute respiratory failure with hypoxia (HCC)   Encounter for central line placement   Knee swelling   CHF (congestive heart failure) (HCC)   LV (left ventricular) mural thrombus (HCC)   Systolic and diastolic CHF, acute on chronic (HCC)   Hypokalemia    1. Acute combined systolic/diastolic congestive heart failure - Echo shows EF of 40-45%.  No overt CHF on exam today - Has received IV diuresis while admitted. Now on Lasix 53m daily. May require further titration as an outpatient.  - started on Lisinopril 2.575mdaily and Coreg. Titrate ACE and BB as BP allows; will increase lisinopril to 5 mg and carvedilol to 12.5 mg. - Reviewed nuclear stress test images. Patient has a large apical anterior, apical inferior and apical infarct which is essentially fixed with no significant ischemia (the polar plot shows peri infarct ischemia which was read out by radiology but review of images shows fixed defect). No further ischemic assessment at this time. Patient can be discharged from a cardiology perspective with close outpatient follow-up once INR > 2-2.5. INR 1.49 TODAY. Continue heparin until > 2.0  - check BMET with low potassium yesterday from diuresis.   2. LV apical thrombus - continue Coumadin. INR subtherapeutic and Heparin started given history of CVA. Goal is INR > 2.0-2.5.  3. Coronary artery disease - no anginal symptoms - resting pulse is elevated; will increase coreg to 12.5 mg bid  4. Hyperlipidemia  -  Lipitor increased to 80 mg daily. Check lipids and liver in 4 weeks.  5. Anemia - H/H 10/31.9  6. Hypokalemia:  - K improved to 3.6 from 3.1; try to get to > or =4.0   ThTroy SineMD,  FARidge[facility]/08/2015, 11:59 AM

## 2015-09-05 NOTE — Progress Notes (Signed)
ANTICOAGULATION CONSULT NOTE - Follow Up Consult  Pharmacy Consult for Heparin and Coumadin Indication:  Hx stroke/TIA, new apical thrombus  No Known Allergies  Patient Measurements: Height: '5\' 11"'  (180.3 cm) Weight: 236 lb 12.4 oz (107.4 kg) IBW/kg (Calculated) : 75.3   Vital Signs: Temp: 98.2 F (36.8 C) (06/05 0630) Temp Source: Oral (06/05 0630) BP: 118/59 mmHg (06/05 1046) Pulse Rate: 100 (06/05 1046)  Labs:  Recent Labs  09/03/15 0451  09/04/15 0649 09/04/15 1608 09/04/15 2140 09/05/15 0544  HGB  --   --  10.1*  --   --  10.0*  HCT  --   --  33.0*  --   --  31.9*  PLT  --   --  148*  --   --  163  LABPROT 20.9*  --  19.0*  --   --  18.1*  INR 1.81*  --  1.60*  --   --  1.49  HEPARINUNFRC  --   < > 0.15* 0.34 0.37 0.30  CREATININE 0.77  --  0.88  --   --   --   < > = values in this interval not displayed.  Estimated Creatinine Clearance: 86.2 mL/min (by C-G formula based on Cr of 0.88).   Medications:  Scheduled:  . atorvastatin  80 mg Oral q1800  . carvedilol  6.25 mg Oral BID WC  . chlorhexidine gluconate (SAGE KIT)  15 mL Mouth Rinse BID  . furosemide  20 mg Oral Daily  . hydrocortisone sod succinate (SOLU-CORTEF) inj  50 mg Intravenous Daily  . lisinopril  2.5 mg Oral Daily  . polyethylene glycol  17 g Oral Daily  . Warfarin - Pharmacist Dosing Inpatient   Does not apply q1800    Assessment: 81yo male with new apical thrombus, bridging with heparin to Coumadin.  Pt was on Coumadin 76m daily pta, but had perhaps missed a dose.    Heparin level is just within goal this AM, INR down again with all doses charted, Hg stable, and pltc now wnl & an upward trend.  No bleeding noted.   Goal of Therapy:  INR 2-3 Heparin level 0.3-0.7 units/ml Monitor platelets by anticoagulation protocol: Yes    Plan:  Increase heparin to 1600 units/hr Repeat Coumadin 147mDaily INR, heparin level and CBC DC IV heparin when INR 2-2.5   KeGracy Bruins PharmD [facility]Rid[facility]

## 2015-09-06 DIAGNOSIS — Z5181 Encounter for therapeutic drug level monitoring: Secondary | ICD-10-CM | POA: Insufficient documentation

## 2015-09-06 DIAGNOSIS — I509 Heart failure, unspecified: Secondary | ICD-10-CM

## 2015-09-06 DIAGNOSIS — Z7901 Long term (current) use of anticoagulants: Secondary | ICD-10-CM

## 2015-09-06 LAB — PROTIME-INR
INR: 1.66 — AB (ref 0.00–1.49)
PROTHROMBIN TIME: 19.6 s — AB (ref 11.6–15.2)

## 2015-09-06 LAB — BASIC METABOLIC PANEL
ANION GAP: 7 (ref 5–15)
BUN: 17 mg/dL (ref 6–20)
CHLORIDE: 101 mmol/L (ref 101–111)
CO2: 30 mmol/L (ref 22–32)
Calcium: 9 mg/dL (ref 8.9–10.3)
Creatinine, Ser: 0.83 mg/dL (ref 0.61–1.24)
GFR calc non Af Amer: >60 mL/min (ref >60)
Glucose, Bld: 97 mg/dL (ref 65–99)
POTASSIUM: 3.8 mmol/L (ref 3.5–5.1)
SODIUM: 138 mmol/L (ref 135–145)

## 2015-09-06 LAB — GLUCOSE, CAPILLARY
GLUCOSE-CAPILLARY: 100 mg/dL — AB (ref 65–99)
Glucose-Capillary: 135 mg/dL — ABNORMAL HIGH (ref 65–99)

## 2015-09-06 LAB — CBC
HCT: 32 % — ABNORMAL LOW (ref 39.0–52.0)
Hemoglobin: 10 g/dL — ABNORMAL LOW (ref 13.0–17.0)
MCH: 29 pg (ref 26.0–34.0)
MCHC: 31.3 g/dL (ref 30.0–36.0)
MCV: 92.8 fL (ref 78.0–100.0)
Platelets: 164 10*3/uL (ref 150–400)
RBC: 3.45 MIL/uL — ABNORMAL LOW (ref 4.22–5.81)
RDW: 15.1 % (ref 11.5–15.5)
WBC: 6.3 10*3/uL (ref 4.0–10.5)

## 2015-09-06 LAB — HEPARIN LEVEL (UNFRACTIONATED): Heparin Unfractionated: 0.5 IU/mL (ref 0.30–0.70)

## 2015-09-06 MED ORDER — CARVEDILOL 6.25 MG PO TABS: 18.7500 mg | ORAL_TABLET | Freq: Two times a day (BID) | ORAL | Status: DC

## 2015-09-06 MED ORDER — POLYETHYLENE GLYCOL 3350 17 G PO PACK: 17.0000 g | PACK | Freq: Every day | ORAL | Status: DC

## 2015-09-06 MED ORDER — LISINOPRIL 10 MG PO TABS: 5.0000 mg | ORAL_TABLET | Freq: Every day | ORAL | Status: DC

## 2015-09-06 MED ORDER — ENOXAPARIN SODIUM 100 MG/ML ~~LOC~~ SOLN: 100.0000 mg | Freq: Two times a day (BID) | SUBCUTANEOUS | Status: DC

## 2015-09-06 MED ORDER — WARFARIN SODIUM 7.5 MG PO TABS
7.5000 mg | ORAL_TABLET | Freq: Once | ORAL | Status: AC
  Administered 2015-09-06: 7.5 mg via ORAL
  Filled 2015-09-06: qty 1

## 2015-09-06 MED ORDER — ATORVASTATIN CALCIUM 40 MG PO TABS: 80.0000 mg | ORAL_TABLET | Freq: Every day | ORAL | Status: DC

## 2015-09-06 MED ORDER — ENOXAPARIN SODIUM 100 MG/ML ~~LOC~~ SOLN
100.0000 mg | Freq: Once | SUBCUTANEOUS | Status: AC
  Administered 2015-09-06: 100 mg via SUBCUTANEOUS
  Filled 2015-09-06: qty 1

## 2015-09-06 MED ORDER — FUROSEMIDE 20 MG PO TABS: 20.0000 mg | ORAL_TABLET | Freq: Every day | ORAL | Status: DC

## 2015-09-06 MED ORDER — WARFARIN SODIUM 5 MG PO TABS: 7.5000 mg | ORAL_TABLET | Freq: Every day | ORAL | Status: DC

## 2015-09-06 MED ORDER — CARVEDILOL 12.5 MG PO TABS
18.7500 mg | ORAL_TABLET | Freq: Two times a day (BID) | ORAL | Status: DC
  Administered 2015-09-06: 18.75 mg via ORAL
  Filled 2015-09-06: qty 1

## 2015-09-06 NOTE — Discharge Summary (Signed)
Physician Discharge Summary  Benjamin Cordova  [id number]  DOB: Dec 28, 1934  DOA: 08/31/2015  PCP: Wende Neighbors, MD  Admit date: 08/31/2015 Discharge date: 09/06/2015  Time spent: Greater than 30 minutes  Recommendations for Outpatient Follow-up:  1. Dr. Wende Neighbors, PCP: Patient states that he has an appointment with his PCP on 09/07/15. Patient is to be seen with repeat labs on 09/07/2015 (CBC, PT & INR). Coumadin dose to be adjusted as needed for goal INR 2-2.5. DC Lovenox bridging when appropriate. 2. Dr. Rozann Lesches, Cardiology in 1 week. 3. Dr. Kathryne Hitch, Orthopedics: Patient missed an appointment that he had for 09/06/15 because he was hospitalized. Advised to call for new appointment. 4. Home health PT. 5. Recommend repeating LFTs and fasting lipid panel in 4 weeks.  Discharge Diagnoses:  Active Problems:   Respiratory failure with hypoxia (HCC)   HCAP (healthcare-associated pneumonia)   Acute respiratory failure with hypoxia (HCC)   Encounter for central line placement   Knee swelling   CHF (congestive heart failure) (HCC)   LV (left ventricular) mural thrombus (HCC)   Systolic and diastolic CHF, acute on chronic (HCC)   Hypokalemia   Acute on chronic respiratory failure with hypoxia and hypercapnia (HCC)   Acute on chronic combined systolic and diastolic CHF (congestive heart failure) (HCC)   Subtherapeutic anticoagulation   Discharge Condition: Improved & Stable  Diet recommendation: Heart healthy diet.  Filed Weights   09/04/15 0452 09/05/15 0630 09/06/15 0500  Weight: 108.1 kg (238 lb 5.1 oz) 107.4 kg (236 lb 12.4 oz) 101.334 kg (223 lb 6.4 oz)    History of present illness:  81 yo male had Lt TKR in April 2017. He had redo CABG in July 2016. He was in short term rehab, and was discharged. He developed progressive shortness of breath and cough. He went to [facility]. He was found to have gurgling respirations, hypoxia. CXR showed infiltrate, and ABG showed  hypercapnia. He was intubated. He was started on IV fluids and Abx for HCAP. He was transferred to [facility] ICU under CCM service. Extubated, off pressors. Stabilized and transferred to floor and [place] service on 6/2.   Hospital Course:   Acute hypoxic and hypercapnic respiratory failure - Secondary to decompensated CHF - Extubated 6/1. Antibiotics discontinued-not felt to be infectious etiology.  - Resolved.  Acute combined systolic and diastolic CHF - S/P VDRF d/t this - 2-D echo 5/31: LVEF 40-45 percent, akinesis of the entire apical myocardium, apical anterior and inferior myocardium, grade 2 diastolic dysfunction and apical thrombus - Diuresed initially with IV Lasix. Lasix changed to by mouth 20 MG daily. -7.569 L since admission. - Cardiology follow-up appreciated and discussed with Dr. Radford Pax. Stress test findings related to prior MI and no further ischemic workup recommended. - Cardiology follow-up appreciated. Titrating up medications. Lisinopril 5 MG daily and carvedilol started and titrated to 18.75 MG twice a day.  - Cardiology has cleared for discharge. Improved and stabilized.  LV Apical thrombus - Patient was on Coumadin prior to admission-? Recent-? A. Fib. - INR was supratherapeutic 3.75 on admission but subsequently became subtherapeutic. Noted new LV apical thrombus on 2-D echocardiogram. Started on IV heparin bridge and Coumadin. Switching to Lovenox on 09/06/15. - Pt needs minimum 5 days of Lovenox bridge and 24hr overlap with therapeutic INR before stopping Lovenox due to new thrombus. Today is day #3/5. - Target INR 2-2.5.  Essential hypertension - Atenolol discontinued. Lisinopril and carvedilol were started at low dose and titrated  up by cardiology to doses as below. - Controlled.  Hyperlipidemia - Lipitor increased to 80 MG daily. Follow-up LFTs and fasting lipids in 4 weeks.  Shock - Unclear etiology. Some concern for septic shock initially from HCAP. Empirically  treated with vancomycin, Zosyn, pressors and stress dose steroids.  - Rapid resolution, PCT low trend, clinically resolved fast consistent with heart failure, abnormal echo noted >Finally felt not infectious etiology and antibiotics were discontinued. Off of pressors. Taper down steroids to DC. - Blood cultures 2: Negative to date. Urine culture: Enterococcus but urine microscopy not impressive for UTI and in the absence of symptoms, we'll not initiate antibiotics for this.  Hyperglycemia - Good inpatient control  Acute metabolic encephalopathy - Resolved  History of CVA - Continue anticoagulation as above.  CAD, s/p CABG - Continue statin. No aspirin given need for Coumadin.  Status post left TKR April 2017 - Ambulated with the help of walker at home. Patient missed her orthopedic appointment that he had for today due to being hospitalized. Advised to reschedule it.  Anemia and thrombocytopenia - Hemoglobin Stable. Thrombocytopenia resolved. Follow CBC as outpatient.  Hypokalemia - Replaced  Asymptomatic bacteriuria  - Urine culture shows enterococcus but in the absence of symptoms, no antibiotics initiated for this.    Patient declined MD's offer to speak with his family to update care and answer any questions.   Consultants:   CCM-signed off   Cardiology  Procedures:  5/31 ETT >> 6/1 5/31 left IJ CVL >> DC'd   Discharge Exam:  Complaints: Denies complaints. Anxious to go home. No chest pain, dyspnea, palpitations, dizziness or lightheadedness.  Filed Vitals:   09/05/15 1754 09/05/15 2214 09/06/15 0500 09/06/15 0530  BP: 110/66 111/61  113/64  Pulse: 99 91  89  Temp:  98.2 F (36.8 C)  99.6 F (37.6 C)  TempSrc:  Oral  Oral  Resp:  17  18  Height:      Weight:   101.334 kg (223 lb 6.4 oz)   SpO2: 98% 98%  96%    General exam: Pleasant elderly male lying comfortably supine in bed.  Respiratory system: clear to auscultation except occasional basal  crackles. Respiratory effort normal.  Cardiovascular system: S1 & S2 heard, RRR. No JVD, murmurs, rubs, gallops or clicks. Trace leg edema.Telemetry: SR.  Gastrointestinal system: Abdomen is nondistended, soft and nontender. No organomegaly or masses felt. Normal bowel sounds heard. Central nervous system: Alert and oriented. No focal neurological deficits. Extremities: Symmetric 5 x 5 power. left knee TKR surgery site healing well without acute findings.  Skin: No rashes, lesions or ulcers Psychiatry: Judgement and insight appear normal. Mood & affect appropriate.   Discharge Instructions      Discharge Instructions    (HEART FAILURE PATIENTS) Call MD:  Anytime you have any of the following symptoms: 1) 3 pound weight gain in 24 hours or 5 pounds in 1 week 2) shortness of breath, with or without a dry hacking cough 3) swelling in the hands, feet or stomach 4) if you have to sleep on extra pillows at night in order to breathe.    Complete by:  As directed      Call MD for:  difficulty breathing, headache or visual disturbances    Complete by:  As directed      Call MD for:  extreme fatigue    Complete by:  As directed      Call MD for:  persistant dizziness or light-headedness  Complete by:  As directed      Call MD for:  persistant nausea and vomiting    Complete by:  As directed      Call MD for:  redness, tenderness, or signs of infection (pain, swelling, redness, odor or green/yellow discharge around incision site)    Complete by:  As directed      Call MD for:  severe uncontrolled pain    Complete by:  As directed      Call MD for:  temperature >100.4    Complete by:  As directed      Diet - low sodium heart healthy    Complete by:  As directed      Increase activity slowly    Complete by:  As directed             Medication List    STOP taking these medications        atenolol 50 MG tablet  Commonly known as:  TENORMIN     diazepam 2 MG tablet  Commonly known  as:  VALIUM     ondansetron 4 MG tablet  Commonly known as:  ZOFRAN      TAKE these medications        atorvastatin 40 MG tablet  Commonly known as:  LIPITOR  Take 2 tablets (80 mg total) by mouth daily.     carvedilol 6.25 MG tablet  Commonly known as:  COREG  Take 3 tablets (18.75 mg total) by mouth 2 (two) times daily with a meal.     CERTAVITE/ANTIOXIDANTS Tabs  Take 1 tablet by mouth daily.     diphenhydrAMINE 25 MG tablet  Commonly known as:  SOMINEX  Take 25 mg by mouth at bedtime as needed for sleep.     enoxaparin 100 MG/ML injection  Commonly known as:  LOVENOX  Inject 1 mL (100 mg total) into the skin every 12 (twelve) hours.  Start taking on:  [id number]     folic acid [id number] MCG tablet  Commonly known as:  FOLVITE  Take 400 mcg by mouth daily.     furosemide 20 MG tablet  Commonly known as:  LASIX  Take 1 tablet (20 mg total) by mouth daily.     lisinopril 10 MG tablet  Commonly known as:  PRINIVIL,ZESTRIL  Take 0.5 tablets (5 mg total) by mouth daily.     magnesium oxide 400 MG tablet  Commonly known as:  MAG-OX  Take 250 mg by mouth at bedtime.     oxyCODONE-acetaminophen 5-325 MG tablet  Commonly known as:  ROXICET  Take 1-2 tablets by mouth every 4 (four) hours as needed.     polyethylene glycol packet  Commonly known as:  MIRALAX / GLYCOLAX  Take 17 g by mouth daily.     senna 8.6 MG tablet  Commonly known as:  SENOKOT  Take 1 tablet by mouth 2 (two) times daily.     SUPER B-COMPLEX/VIT C/FA Tabs  Take 1 tablet by mouth daily.     URINOZINC PLUS PO  Take 1 tablet by mouth daily.     vitamin C 500 MG tablet  Commonly known as:  ASCORBIC ACID  Take 500 mg by mouth daily.     warfarin 5 MG tablet  Commonly known as:  COUMADIN  Take 1.5 tablets (7.5 mg total) by mouth daily at 6 PM. OR AS DIRECTED by MD.  Start taking on:  09/07/2015       Follow-up  Information    Follow up with [facility].   Contact information:   [place] [address] [phone]       Follow up with Wende Neighbors, MD On 09/07/2015.   Specialty:  Internal Medicine   Why:  To be seen with repeat labs (CBC, PT & INR). Coumadin dose to be adjusted and determine timing of discontinuing Lovenox.   Contact information:   [place] [address] [phone]       Follow up with Rozann Lesches, MD. Schedule an appointment as soon as possible for a visit in 1 week.   Specialty:  Cardiology   Contact information:   [place] [address] [phone]       Schedule an appointment as soon as possible for a visit with [name], MD.   Specialty:  Orthopedic Surgery   Contact information:   [address] [place] [address] [phone]       Get Medicines reviewed and adjusted: Please take all your medications with you for your next visit with your Primary MD  Please request your Primary MD to go over all hospital tests and procedure/radiological results at the follow up. Please ask your Primary MD to get all Hospital records sent to his/her office.  If you experience worsening of your admission symptoms, develop shortness of breath, life threatening emergency, suicidal or homicidal thoughts you must seek medical attention immediately by calling 911 or calling your MD immediately if symptoms less severe.  You must read complete instructions/literature along with all the possible adverse reactions/side effects for all the Medicines you take and that have been prescribed to you. Take any new Medicines after you have completely understood and accept all the possible adverse reactions/side effects.   Do not drive when taking pain medications.   Do not take more than prescribed Pain, Sleep and Anxiety Medications  Special Instructions: If you have smoked or chewed Tobacco in the last 2 yrs please stop smoking, stop any regular Alcohol and or any Recreational  drug use.  Wear Seat belts while driving.  Please note  You were cared for by a hospitalist during your hospital stay. Once you are discharged, your primary care physician will handle any further medical issues. Please note that NO REFILLS for any discharge medications will be authorized once you are discharged, as it is imperative that you return to your primary care physician (or establish a relationship with a primary care physician if you do not have one) for your aftercare needs so that they can reassess your need for medications and monitor your lab values.    The results of significant diagnostics from this hospitalization (including imaging, microbiology, ancillary and laboratory) are listed below for reference.    Significant Diagnostic Studies: Nm Myocar Multi W/spect W/wall Motion / Ef  09/03/2015  CLINICAL DATA:  Shortness of breath. EXAM: MYOCARDIAL IMAGING WITH SPECT (REST AND PHARMACOLOGIC-STRESS) GATED LEFT VENTRICULAR WALL MOTION STUDY LEFT VENTRICULAR EJECTION FRACTION TECHNIQUE: Standard myocardial SPECT imaging was performed after resting intravenous injection of 10 mCi Tc-28m tetrofosmin. Subsequently, intravenous infusion of Lexiscan was performed under the supervision of the Cardiology staff. At peak effect of the drug, 30 mCi Tc-90m tetrofosmin was injected intravenously and standard myocardial SPECT imaging was performed. Quantitative gated imaging was also performed to evaluate left ventricular wall motion, and estimate left ventricular ejection fraction. COMPARISON:  None. FINDINGS: Perfusion: There is a large  fixed defect at the apex extending into the anterior and inferior walls compatible with old infarct/scar. Areas of decreased perfusion on the stress images in the adjacent anterior wall and lower septum concerning for peri-infarct ischemia. Wall Motion: Severe diffuse hypokinesia with akinesia in the apex, anterior and inferior walls. Dilated left ventricle. Left  Ventricular Ejection Fraction: 26 % End diastolic volume 123456 ml End systolic volume 123XX123 ml IMPRESSION: 1. Large fixed defect at the apex extending into the anterior and inferior walls. Concern for peri-infarct ischemia in the anterior wall and lower septum. 2. Severe diffuse hypokinesia with akinesia in the areas of scar. 3. Left ventricular ejection fraction 26% 4. Non invasive risk stratification*: High risk *2012 Appropriate Use Criteria for Coronary Revascularization Focused Update: J Am Coll Cardiol. [id number]. http://content.airportbarriers.com.aspx?articleid=1201161 Electronically Signed   By: Rolm Baptise M.D.   On: 09/03/2015 13:09   Dg Chest Port 1 View  09/01/2015  CLINICAL DATA: Pneumonia. EXAM: PORTABLE CHEST 1 VIEW COMPARISON:  08/31/2015. FINDINGS: Endotracheal tube, NG tube, left IJ line stable position. Prior CABG. Stable cardiomegaly. Continued interval clearing of bilateral pulmonary infiltrates/edema. Small left pleural effusion. No pneumothorax. IMPRESSION: 1. Lines and tubes in stable position. 2. Prior CABG. Continued interval clearing of bilateral pulmonary infiltrates consistent with clearing pulmonary edema. Small left pleural effusion. Electronically Signed   By: Marcello Moores  Register   On: 09/01/2015 07:26   Dg Chest Port 1 View  08/31/2015  CLINICAL DATA:  Hypoxia.  Central catheter placed EXAM: PORTABLE CHEST 1 VIEW COMPARISON:  Study obtained earlier in the day FINDINGS: Endotracheal tube tip is 5.8 cm above carina. Central catheter tip is in the superior vena cava near the cavoatrial junction. Nasogastric tube tip and side port are below the diaphragm. No pneumothorax. There is less airspace consolidation compared to earlier in the day. There is patchy airspace disease currently with underlying interstitial edema and bilateral pleural effusions. There is cardiomegaly with mild pulmonary venous hypertension. No new opacity. No adenopathy evident. IMPRESSION: Tube and  catheter positions as described without pneumothorax. Evidence a degree of congestive heart failure. Considerably less alveolar consolidation is noted compared to earlier in the day. No new opacity currently. Electronically Signed   By: Lowella Grip III M.D.   On: 08/31/2015 08:00   Dg Chest Portable 1 View  08/31/2015  CLINICAL DATA:  Endotracheal tube placement.  Shortness of breath. EXAM: PORTABLE CHEST 1 VIEW COMPARISON:  12/15/2014 FINDINGS: Endotracheal tube 5.9 cm from the carina. Patient is post median sternotomy. Heart at the upper limits normal in size. Right greater than left diffuse opacities, primarily perihilar. Suspect small pleural effusions and fluid in the right minor fissure. No evidence of pneumothorax. IMPRESSION: 1. Endotracheal tube 5.9 cm from the carina. 2. Diffuse right greater than left opacities, may be pulmonary edema, aspiration or pneumonia. Suspect small pleural effusions. Electronically Signed   By: Jeb Levering M.D.   On: 08/31/2015 00:38   Dg Knee Left Port  08/31/2015  CLINICAL DATA:  Left knee swelling and tenderness. Status post left knee replacement. EXAM: PORTABLE LEFT KNEE - 1-2 VIEW COMPARISON:  07/27/2015 FINDINGS: No fracture. No bone lesion. Femoral tibial prosthetic components appear well seated and aligned without evidence of loosening. There is a large joint effusion. Subcutaneous edema is seen diffusely. IMPRESSION: 1. No fracture or bone lesion. 2. No evidence of prosthetic loosening. 3. Large joint effusion and nonspecific soft tissue edema. Electronically Signed   By: Lajean Manes M.D.   On: 08/31/2015 08:23  Dg Abd Portable 1v  08/31/2015  CLINICAL DATA:  Nasogastric tube placement EXAM: PORTABLE ABDOMEN - 1 VIEW COMPARISON:  CT abdomen and pelvis September 13, 2010 FINDINGS: Nasogastric tube tip and side port are in the stomach with the nasogastric tube tip near the pylorus. There is diffuse stool throughout colon. There is no bowel dilatation or  air-fluid level suggesting obstruction. No free air. IMPRESSION: Nasogastric tube tip and side port in stomach. Overall bowel gas pattern unremarkable. Diffuse stool noted in colon. Electronically Signed   By: Lowella Grip III M.D.   On: 08/31/2015 08:23    Microbiology: Recent Results (from the past 240 hour(s))  Urine culture     Status: Abnormal   Collection Time: 08/31/15 12:38 AM  Result Value Ref Range Status   Specimen Description URINE, CLEAN CATCH  Final   Special Requests NONE  Final   Culture >=100,000 COLONIES/mL ENTEROCOCCUS SPECIES (A)  Final   Report Status 09/02/2015 FINAL  Final   Organism ID, Bacteria ENTEROCOCCUS SPECIES (A)  Final      Susceptibility   Enterococcus species - MIC*    AMPICILLIN <=2 SENSITIVE Sensitive     LEVOFLOXACIN 1 SENSITIVE Sensitive     NITROFURANTOIN <=16 SENSITIVE Sensitive     VANCOMYCIN 2 SENSITIVE Sensitive     * >=100,000 COLONIES/mL ENTEROCOCCUS SPECIES  Blood culture (routine x 2)     Status: None   Collection Time: 08/31/15 12:51 AM  Result Value Ref Range Status   Specimen Description BLOOD RIGHT HAND  Final   Special Requests BOTTLES DRAWN AEROBIC AND ANAEROBIC 6CC EACH  Final   Culture NO GROWTH 5 DAYS  Final   Report Status 09/05/2015 FINAL  Final  Blood culture (routine x 2)     Status: None   Collection Time: 08/31/15 12:58 AM  Result Value Ref Range Status   Specimen Description BLOOD LEFT HAND  Final   Special Requests BOTTLES DRAWN AEROBIC AND ANAEROBIC [place] EACH  Final   Culture NO GROWTH 5 DAYS  Final   Report Status 09/05/2015 FINAL  Final  MRSA PCR Screening     Status: None   Collection Time: 08/31/15  5:16 AM  Result Value Ref Range Status   MRSA by PCR NEGATIVE NEGATIVE Final    Comment:        The GeneXpert MRSA Assay (FDA approved for NASAL specimens only), is one component of a comprehensive MRSA colonization surveillance program. It is not intended to diagnose MRSA infection nor to guide  or monitor treatment for MRSA infections.   Culture, respiratory (NON-Expectorated)     Status: None   Collection Time: 08/31/15 11:13 AM  Result Value Ref Range Status   Specimen Description ENDOTRACHEAL  Final   Special Requests NONE  Final   Gram Stain   Final    MODERATE WBC PRESENT,BOTH PMN AND MONONUCLEAR FEW GRAM POSITIVE COCCI IN PAIRS FEW GRAM VARIABLE ROD    Culture Consistent with normal respiratory flora.  Final   Report Status 09/02/2015 FINAL  Final     Labs: Basic Metabolic Panel:  Recent Labs Lab 08/31/15 1109 08/31/15 1822 09/01/15 0340 09/01/15 1645 09/02/15 [id number] 09/03/15 0451 09/04/15 0649 09/06/15 0625  NA  --   --  141  --  139 140 140 138  K  --   --  3.8  --  4.0 3.1* 3.6 3.8  CL  --   --  108  --  107 103 103 101  CO2  --   --  28  --  28 30 30 30   GLUCOSE  --   --  120*  --  137* 107* 96 97  BUN  --   --  25*  --  20 21* 17 17  CREATININE  --   --  0.71  --  0.64 0.77 0.88 0.83  CALCIUM  --   --  8.3*  --  8.8* 9.0 8.9 9.0  MG 2.0 2.0 2.0 2.0  --   --   --   --   PHOS 3.0 2.6 2.2* 2.3*  --   --   --   --    Liver Function Tests:  Recent Labs Lab 08/31/15 0058 09/01/15 0340 09/02/15 0621  AST 23 17 22   ALT 16* 13* 16*  ALKPHOS 74 50 52  BILITOT 0.5 0.6 0.8  PROT 7.0 5.1* 5.6*  ALBUMIN 4.1 2.7* 2.9*   No results for input(s): LIPASE, AMYLASE in the last 168 hours. No results for input(s): AMMONIA in the last 168 hours. CBC:  Recent Labs Lab 08/31/15 0058 09/01/15 0340 09/02/15 0621 09/04/15 0649 09/05/15 0544 09/06/15 0625  WBC 13.3* 5.8 6.2 4.6 5.5 6.3  NEUTROABS 9.7*  --   --   --   --   --   HGB 12.7* 8.7* 9.1* 10.1* 10.0* 10.0*  HCT 40.3 28.4* 29.1* 33.0* 31.9* 32.0*  MCV 94.4 93.7 92.7 92.4 91.4 92.8  PLT 271 129* 133* 148* 163 164   Cardiac Enzymes:  Recent Labs Lab 08/31/15 0058 08/31/15 1109 08/31/15 1755  TROPONINI 0.03 0.16* 0.11*   BNP: BNP (last 3 results)  Recent Labs  08/31/15 0058  BNP  349.0*    ProBNP (last 3 results) No results for input(s): PROBNP in the last 8760 hours.  CBG:  Recent Labs Lab 09/05/15 1316 09/05/15 1729 09/05/15 2152 09/06/15 0752 09/06/15 1149  GLUCAP 153* 134* 126* 100* 135*       Signed:  Vernell Leep, MD, FACP, FHM. [facility] Pager [phone]  If 7PM-7AM, please contact night-coverage [email or web address] Password [email or web address] 09/06/2015, 3:13 PM

## 2015-09-06 NOTE — Consult Note (Signed)
   [facility] CM Inpatient Consult   09/06/2015  Benjamin Cordova 06-17-1934 [id number] Update:  Met with the patient to explain his benefits with [facility] Care Management.  Patient endorses Dr. Wende Neighbors as his primary care provider and [facility]. S/P CABG with recent knee surgery and has had 2 admission in the past 6 months.    Patient states he feels that he will do fine with just [facility] for home health care.  He did accept a brochure with contact information regarding [place] [facility] services.  Explained that any services arranged by inpatient care management staff did not interfere with or replace [facility] management.  Patient verbalized understanding.  He plans to discharge home with home health.  He also states he will eventually get back to his [employer] program.  No further questions or needs stated.  For questions, please contact:  Natividad Brood, RN BSN [facility] Liaison  [phone] business mobile phone Toll free office [phone]

## 2015-09-06 NOTE — Care Management Note (Addendum)
Case Management Note  Patient Details  Name: Benjamin Cordova MRN: [id number] Date of Birth: 05-Oct-1934  Subjective/Objective:                    Action/Plan: Plan is to d/c to home to with home health services today.  Expected Discharge Date:   09/06/2015            Expected Discharge Plan:  [place]  In-House Referral:     Discharge planning Services  CM Consult  Post Acute Care Choice:   Choice offered to:  Patient  DME Arranged:    DME Agency:     HH Arranged:  PT HH Agency:  [facility]  Status of Service:  Completed, signed off  Medicare Important Message Given:   Date Medicare IM Given:    Medicare IM give by:    Date Additional Medicare IM Given:    Additional Medicare Important Message give by:     If discussed at [place] of Stay Meetings, dates discussed:    Additional Comments:  Sharin Mons, [place] [phone] 09/06/2015, 11:31 AM

## 2015-09-06 NOTE — Discharge Instructions (Signed)

## 2015-09-06 NOTE — Progress Notes (Signed)
Subjective:  No chest pain  Objective:   Vital Signs : Filed Vitals:   09/05/15 1754 09/05/15 2214 09/06/15 0500 09/06/15 0530  BP: 110/66 111/61  113/64  Pulse: 99 91  89  Temp:  98.2 F (36.8 C)  99.6 F (37.6 C)  TempSrc:  Oral  Oral  Resp:  17  18  Height:      Weight:   223 lb 6.4 oz (101.334 kg)   SpO2: 98% 98%  96%    Intake/Output from previous day:  Intake/Output Summary (Last 24 hours) at 09/06/15 1059 Last data filed at 09/06/15 0840  Gross per 24 hour  Intake    537 ml  Output   2275 ml  Net  -1738 ml    I/O since admission: -6869  Wt Readings from Last 3 Encounters:  09/06/15 223 lb 6.4 oz (101.334 kg)  08/08/15 242 lb (109.77 kg)  08/05/15 242 lb (109.77 kg)    Medications: . atorvastatin  80 mg Oral q1800  . carvedilol  12.5 mg Oral BID WC  . chlorhexidine gluconate (SAGE KIT)  15 mL Mouth Rinse BID  . furosemide  20 mg Oral Daily  . lisinopril  5 mg Oral Daily  . polyethylene glycol  17 g Oral Daily  . Warfarin - Pharmacist Dosing Inpatient   Does not apply q1800    . sodium chloride Stopped (09/01/15 1800)  . heparin 1,600 Units/hr (09/06/15 0705)    Physical Exam:   General appearance: alert, cooperative and no distress Neck: no adenopathy, no carotid bruit, no JVD, supple, symmetrical, trachea midline and thyroid not enlarged, symmetric, no tenderness/mass/nodules Lungs: decreased BS at bases Heart: Tachycardic and regular; 1/6 SEM Abdomen: soft, non-tender; bowel sounds normal; no masses,  no organomegaly Extremities: no edema, redness or tenderness in the calves or thighs Pulses: 2+ and symmetric Skin: Skin color, texture, turgor normal. No rashes or lesions Neurologic: Grossly normal   Rate: 108  Rhythm: sinus tachycardia  ECG (independently read by me):  Lab Results:   Recent Labs  09/04/15 0649 09/06/15 0625  NA 140 138  K 3.6 3.8  CL 103 101  CO2 30 30  GLUCOSE 96 97  BUN 17 17  CREATININE 0.88 0.83    CALCIUM 8.9 9.0    Hepatic Function Latest Ref Rng 09/02/2015 09/01/2015 08/31/2015  Total Protein 6.5 - 8.1 g/dL 5.6(L) 5.1(L) 7.0  Albumin 3.5 - 5.0 g/dL 2.9(L) 2.7(L) 4.1  AST 15 - 41 U/L '22 17 23  ' ALT 17 - 63 U/L 16(L) 13(L) 16(L)  Alk Phosphatase 38 - 126 U/L 52 50 74  Total Bilirubin 0.3 - 1.2 mg/dL 0.8 0.6 0.5     Recent Labs  09/04/15 0649 09/05/15 0544 09/06/15 0625  WBC 4.6 5.5 6.3  HGB 10.1* 10.0* 10.0*  HCT 33.0* 31.9* 32.0*  MCV 92.4 91.4 92.8  PLT 148* 163 164    No results for input(s): TROPONINI in the last 72 hours.  Invalid input(s): CK, MB  Lab Results  Component Value Date   TSH 1.363 09/02/2015   No results for input(s): HGBA1C in the last 72 hours.  No results for input(s): PROT, ALBUMIN, AST, ALT, ALKPHOS, BILITOT, BILIDIR, IBILI in the last 72 hours.  Recent Labs  09/06/15 0625  INR 1.66*   BNP (last 3 results)  Recent Labs  08/31/15 0058  BNP 349.0*    ProBNP (last 3 results) No results for input(s): PROBNP in the last 8760 hours.  Lipid Panel     Component Value Date/Time   CHOL 118* 03/05/2015 0834   TRIG 104 03/05/2015 0834   HDL 39* 03/05/2015 0834   CHOLHDL 3.0 03/05/2015 0834   VLDL 21 03/05/2015 0834   LDLCALC 58 03/05/2015 0834      Imaging:  No results found.  ------------------------------------------------------------------- 08/31/15 ECHO Study Conclusions  - Left ventricle: The cavity size was normal. Systolic function was  mildly to moderately reduced. The estimated ejection fraction was  in the range of 40% to 45%. There is akinesis of the entireapical  myocardium. There is akinesis of the apicalanterior and inferior  myocardium. Features are consistent with a pseudonormal left  ventricular filling pattern, with concomitant abnormal relaxation  and increased filling pressure (grade 2 diastolic dysfunction).  Acoustic contrast opacification revealed a possible,  apicalthrombus. - Mitral  valve: There was mild regurgitation. - Pericardium, extracardiac: There was a left pleural effusion.  Assessment/Plan:   Active Problems:   Respiratory failure with hypoxia (HCC)   HCAP (healthcare-associated pneumonia)   Acute respiratory failure with hypoxia (HCC)   Encounter for central line placement   Knee swelling   CHF (congestive heart failure) (HCC)   LV (left ventricular) mural thrombus (HCC)   Systolic and diastolic CHF, acute on chronic (HCC)   Hypokalemia   Acute on chronic respiratory failure with hypoxia and hypercapnia (HCC)   Acute on chronic combined systolic and diastolic CHF (congestive heart failure) ([place])    1. Acute combined systolic/diastolic congestive heart failure - Echo shows EF of 40-45%.  No overt CHF on exam today - Has received IV diuresis while admitted. Now on Lasix 30m daily. May require further titration as an outpatient.  - started on Lisinopril 2.559mdaily and Coreg. Titrate ACE and BB as BP allows; now on lisinopril  5 mg and carvedilol 12.5 mg; HR in the 90's; will increase carvedilol to 18.75 mg bid. - Reviewed nuclear stress test images. Patient has a large apical anterior, apical inferior and apical infarct which is essentially fixed with no significant ischemia (the polar plot shows peri infarct ischemia which was read out by radiology but review of images shows fixed defect). No further ischemic assessment at this time. Patient can be discharged from a cardiology perspective with close outpatient follow-up once INR > 2-2.5. INR 1.66 TODAY up from 1.49 yesterday . Continue heparin until > 2.0; or if to be dc'd on full dose lovenox for 2 daysand check INR at end of week. -      2. LV apical thrombus - continue Coumadin. INR subtherapeutic and Heparin started given history of CVA. Goal is INR > 2.0-2.5.  3. Coronary artery disease - no anginal symptoms - resting pulse is elevated; will increase coreg to 12.5 mg bid  4.  Hyperlipidemia  -  Lipitor increased to 80 mg daily. Check lipids and liver in 4 weeks.  5. Anemia - H/H 10/31.9  6. Hypokalemia:  - K improved to 3.8   ThTroy SineMD, FACentral [facility] End[facility] LLC/09/2015, 10:59 AM

## 2015-09-06 NOTE — Progress Notes (Signed)
Pt given discharge instructions, prescriptions, and care notes. Pt verbalized understanding AEB no further questions or concerns at this time. IV was discontinued, no redness, pain, or swelling noted at this time. Telemetry discontinued and Centralized Telemetry was notified. Pt left the floor via wheelchair with staff in stable condition. 

## 2015-09-09 DIAGNOSIS — I639 Cerebral infarction, unspecified: Secondary | ICD-10-CM | POA: Diagnosis not present

## 2015-09-09 DIAGNOSIS — M1712 Unilateral primary osteoarthritis, left knee: Secondary | ICD-10-CM | POA: Diagnosis not present

## 2015-09-10 ENCOUNTER — Emergency Department (HOSPITAL_COMMUNITY): Payer: Commercial Managed Care - HMO

## 2015-09-10 ENCOUNTER — Emergency Department (HOSPITAL_COMMUNITY)
Admission: EM | Admit: 2015-09-10 | Discharge: 2015-09-10 | Disposition: A | Discharge status: Home / Self Care | Payer: Commercial Managed Care - HMO | Attending: Emergency Medicine | Admitting: Emergency Medicine

## 2015-09-10 ENCOUNTER — Encounter (HOSPITAL_COMMUNITY): Payer: Self-pay

## 2015-09-10 DIAGNOSIS — Z79899 Other long term (current) drug therapy: Secondary | ICD-10-CM | POA: Insufficient documentation

## 2015-09-10 DIAGNOSIS — I252 Old myocardial infarction: Secondary | ICD-10-CM | POA: Insufficient documentation

## 2015-09-10 DIAGNOSIS — Y939 Activity, unspecified: Secondary | ICD-10-CM | POA: Diagnosis not present

## 2015-09-10 DIAGNOSIS — I10 Essential (primary) hypertension: Secondary | ICD-10-CM | POA: Diagnosis not present

## 2015-09-10 DIAGNOSIS — Y999 Unspecified external cause status: Secondary | ICD-10-CM | POA: Diagnosis not present

## 2015-09-10 DIAGNOSIS — W010XXA Fall on same level from slipping, tripping and stumbling without subsequent striking against object, initial encounter: Secondary | ICD-10-CM | POA: Diagnosis not present

## 2015-09-10 DIAGNOSIS — I739 Peripheral vascular disease, unspecified: Secondary | ICD-10-CM | POA: Insufficient documentation

## 2015-09-10 DIAGNOSIS — R2242 Localized swelling, mass and lump, left lower limb: Secondary | ICD-10-CM | POA: Diagnosis not present

## 2015-09-10 DIAGNOSIS — M1990 Unspecified osteoarthritis, unspecified site: Secondary | ICD-10-CM | POA: Diagnosis not present

## 2015-09-10 DIAGNOSIS — Z8673 Personal history of transient ischemic attack (TIA), and cerebral infarction without residual deficits: Secondary | ICD-10-CM | POA: Insufficient documentation

## 2015-09-10 DIAGNOSIS — Z7901 Long term (current) use of anticoagulants: Secondary | ICD-10-CM | POA: Insufficient documentation

## 2015-09-10 DIAGNOSIS — W19XXXA Unspecified fall, initial encounter: Secondary | ICD-10-CM

## 2015-09-10 DIAGNOSIS — M25562 Pain in left knee: Secondary | ICD-10-CM | POA: Insufficient documentation

## 2015-09-10 DIAGNOSIS — Z85828 Personal history of other malignant neoplasm of skin: Secondary | ICD-10-CM | POA: Insufficient documentation

## 2015-09-10 DIAGNOSIS — I2510 Atherosclerotic heart disease of native coronary artery without angina pectoris: Secondary | ICD-10-CM | POA: Insufficient documentation

## 2015-09-10 DIAGNOSIS — M25462 Effusion, left knee: Secondary | ICD-10-CM | POA: Diagnosis not present

## 2015-09-10 DIAGNOSIS — Y929 Unspecified place or not applicable: Secondary | ICD-10-CM | POA: Diagnosis not present

## 2015-09-10 DIAGNOSIS — Z87891 Personal history of nicotine dependence: Secondary | ICD-10-CM | POA: Diagnosis not present

## 2015-09-10 LAB — PROTIME-INR
INR: 3.17 — AB (ref 0.00–1.49)
Prothrombin Time: 31.9 seconds — ABNORMAL HIGH (ref 11.6–15.2)

## 2015-09-10 MED ORDER — OXYCODONE-ACETAMINOPHEN 5-325 MG PO TABS
2.0000 | ORAL_TABLET | Freq: Once | ORAL | Status: AC
  Administered 2015-09-10: 2 via ORAL
  Filled 2015-09-10: qty 2

## 2015-09-10 NOTE — Discharge Instructions (Signed)
Your INR today was 3.17   Please return to the emergency department for any new or worsening symptoms     Cryotherapy Cryotherapy means treatment with cold. Ice or gel packs can be used to reduce both pain and swelling. Ice is the most helpful within the first 24 to 48 hours after an injury or flare-up from overusing a muscle or joint. Sprains, strains, spasms, burning pain, shooting pain, and aches can all be eased with ice. Ice can also be used when recovering from surgery. Ice is effective, has very few side effects, and is safe for most people to use. PRECAUTIONS  Ice is not a safe treatment option for people with:  Raynaud phenomenon. This is a condition affecting small blood vessels in the extremities. Exposure to cold may cause your problems to return.  Cold hypersensitivity. There are many forms of cold hypersensitivity, including:  Cold urticaria. Red, itchy hives appear on the skin when the tissues begin to warm after being iced.  Cold erythema. This is a red, itchy rash caused by exposure to cold.  Cold hemoglobinuria. Red blood cells break down when the tissues begin to warm after being iced. The hemoglobin that carry oxygen are passed into the urine because they cannot combine with blood proteins fast enough.  Numbness or altered sensitivity in the area being iced. If you have any of the following conditions, do not use ice until you have discussed cryotherapy with your caregiver:  Heart conditions, such as arrhythmia, angina, or chronic heart disease.  High blood pressure.  Healing wounds or open skin in the area being iced.  Current infections.  Rheumatoid arthritis.  Poor circulation.  Diabetes. Ice slows the blood flow in the region it is applied. This is beneficial when trying to stop inflamed tissues from spreading irritating chemicals to surrounding tissues. However, if you expose your skin to cold temperatures for too long or without the proper protection,  you can damage your skin or nerves. Watch for signs of skin damage due to cold. HOME CARE INSTRUCTIONS Follow these tips to use ice and cold packs safely.  Place a dry or damp towel between the ice and skin. A damp towel will cool the skin more quickly, so you may need to shorten the time that the ice is used.  For a more rapid response, add gentle compression to the ice.  Ice for no more than 10 to 20 minutes at a time. The bonier the area you are icing, the less time it will take to get the benefits of ice.  Check your skin after 5 minutes to make sure there are no signs of a poor response to cold or skin damage.  Rest 20 minutes or more between uses.  Once your skin is numb, you can end your treatment. You can test numbness by very lightly touching your skin. The touch should be so light that you do not see the skin dimple from the pressure of your fingertip. When using ice, most people will feel these normal sensations in this order: cold, burning, aching, and numbness.  Do not use ice on someone who cannot communicate their responses to pain, such as small children or people with dementia. HOW TO MAKE AN ICE PACK Ice packs are the most common way to use ice therapy. Other methods include ice massage, ice baths, and cryosprays. Muscle creams that cause a cold, tingly feeling do not offer the same benefits that ice offers and should not be used as  a substitute unless recommended by your caregiver. To make an ice pack, do one of the following:  Place crushed ice or a bag of frozen vegetables in a sealable plastic bag. Squeeze out the excess air. Place this bag inside another plastic bag. Slide the bag into a pillowcase or place a damp towel between your skin and the bag.  Mix 3 parts water with 1 part rubbing alcohol. Freeze the mixture in a sealable plastic bag. When you remove the mixture from the freezer, it will be slushy. Squeeze out the excess air. Place this bag inside another plastic  bag. Slide the bag into a pillowcase or place a damp towel between your skin and the bag. SEEK MEDICAL CARE IF:  You develop white spots on your skin. This may give the skin a blotchy (mottled) appearance.  Your skin turns blue or pale.  Your skin becomes waxy or hard.  Your swelling gets worse. MAKE SURE YOU:   Understand these instructions.  Will watch your condition.  Will get help right away if you are not doing well or get worse.   This information is not intended to replace advice given to you by your health care provider. Make sure you discuss any questions you have with your health care provider.   Document Released: 11/13/2010 Document Revised: 04/09/2014 Document Reviewed: 11/13/2010 Elsevier Interactive Patient Education [employer].

## 2015-09-10 NOTE — ED Notes (Signed)
Pt states he fell earlier in the day, feels like he twisted the left knee.  Pt had knee replacement 6 weeks ago.

## 2015-09-10 NOTE — ED Provider Notes (Signed)
CSN: [id number]     Arrival date & time 09/10/15  0206 History   First MD Initiated Contact with Patient 09/10/15 0246     Chief Complaint  Patient presents with  . knee pain s/p replacement     HPI Patient presents to the emergency department 6 weeks post total left knee arthroplasty.  He states tonight he was walking and slipped and fell backwards from ground height.  He reports new pain in his left knee since the fall tonight.  He denies head injury and neck pain.  He reports they took her last dose Lovenox today and they're currently working on a therapeutic INR.  He reports his last INR several days ago was 1.6.  He is requesting his INR be rechecked today   Past Medical History  Diagnosis Date  . Coronary artery disease     Multivessel s/p CABG [facility] at [facility]  . Essential hypertension   . Myocardial infarction ([place]) 1980  . Hematuria   . Peripheral vascular disease ([place])   . 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 TIA (transient ischemic attack) 1989    Chronic coumadin  . Stroke ([place])   . Shortness of breath dyspnea   . Skin cancer     "burned them off my back"  . High cholesterol   . Anginal pain ([place]) <1980  . Pneumonia 1980  . History of blood transfusion 07/2015    w/knee replacement HgB <9  . Arthritis     "all over my body"  . UTI (lower urinary tract infection) "had 4 UTI in 2016"   Past Surgical History  Procedure Laterality Date  . Foot surgery Right 1954    [facility], muscle implantation  . Coronary artery bypass graft  1981    [facility]  . Cystoscopy with injection  10/10/2010    Procedure: CYSTOSCOPY WITH INJECTION;  Surgeon: Marissa Nestle;  Location: [facility];  Service: Urology;  Laterality: N/A;  with retrograde urethrogram  . Cardiac catheterization N/A 10/05/2014    Procedure: Left Heart Cath and Coronary Angiography;  Surgeon: Peter M Martinique, MD;  Location: [place] [facility];  Service: Cardiovascular;  Laterality: N/A;  .  Coronary artery bypass graft N/A 10/08/2014    Procedure: REDO CORONARY ARTERY BYPASS GRAFTING (CABG) Times Two Grafts using Left Internal Mammary and Left Radial Artery;  Surgeon: Melrose Nakayama, MD;  Location: [place];  Service: Open Heart Surgery;  Laterality: N/A;  . Tee without cardioversion N/A 10/08/2014    Procedure: TRANSESOPHAGEAL ECHOCARDIOGRAM (TEE);  Surgeon: Melrose Nakayama, MD;  Location: [place];  Service: Open Heart Surgery;  Laterality: N/A;  . Radial artery harvest Left 10/08/2014    Procedure: Left RADIAL ARTERY HARVEST;  Surgeon: Melrose Nakayama, MD;  Location: [place];  Service: Open Heart Surgery;  Laterality: Left;  . Total knee arthroplasty Left 07/27/2015    Procedure: TOTAL LEFT KNEE ARTHROPLASTY;  Surgeon: [name], MD;  Location: [place];  Service: Orthopedics;  Laterality: Left;  . Joint replacement    . Tonsillectomy  1949    [facility]   Family History  Problem Relation Age of Onset  . Anesthesia problems Neg Hx   . Hypotension Neg Hx   . Malignant hyperthermia Neg Hx   . Pseudochol deficiency Neg Hx    Social History  Substance Use Topics  . Smoking status: Former Smoker -- 1.00 packs/day for 28 years  Types: Cigarettes    Quit date: 12/02/1978  . Smokeless tobacco: Never Used  . Alcohol Use: No    Review of Systems  All other systems reviewed and are negative.     Allergies  Review of patient's allergies indicates no known allergies.  Home Medications   Prior to Admission medications   Medication Sig Start Date End Date Taking? Authorizing Provider  atorvastatin (LIPITOR) 40 MG tablet Take 2 tablets (80 mg total) by mouth daily. 09/06/15   Modena Jansky, MD  B Complex-C-Folic Acid (SUPER B-COMPLEX/VIT C/FA) TABS Take 1 tablet by mouth daily.      Historical Provider, MD  carvedilol (COREG) 6.25 MG tablet Take 3 tablets (18.75 mg total) by mouth 2 (two) times daily with a meal. 09/06/15   Modena Jansky, MD  diphenhydrAMINE (SOMINEX) 25  MG tablet Take 25 mg by mouth at bedtime as needed for sleep.    Historical Provider, MD  enoxaparin (LOVENOX) 100 MG/ML injection Inject 1 mL (100 mg total) into the skin every 12 (twelve) hours. 09/07/15   Modena Jansky, MD  folic acid (FOLVITE) Q000111Q MCG tablet Take 400 mcg by mouth daily.     Historical Provider, MD  furosemide (LASIX) 20 MG tablet Take 1 tablet (20 mg total) by mouth daily. 09/06/15   Modena Jansky, MD  lisinopril (PRINIVIL,ZESTRIL) 10 MG tablet Take 0.5 tablets (5 mg total) by mouth daily. 09/06/15   Modena Jansky, MD  magnesium oxide (MAG-OX) 400 MG tablet Take 250 mg by mouth at bedtime.     Historical Provider, MD  Misc Natural Products (URINOZINC PLUS PO) Take 1 tablet by mouth daily.    Historical Provider, MD  Multiple Vitamins-Minerals (CERTAVITE/ANTIOXIDANTS) TABS Take 1 tablet by mouth daily.     Historical Provider, MD  oxyCODONE-acetaminophen (ROXICET) 5-325 MG tablet Take 1-2 tablets by mouth every 4 (four) hours as needed. 07/27/15   Aundra Dubin, PA-C  polyethylene glycol (MIRALAX / GLYCOLAX) packet Take 17 g by mouth daily. 09/06/15   Modena Jansky, MD  senna (SENOKOT) 8.6 MG tablet Take 1 tablet by mouth 2 (two) times daily.     Historical Provider, MD  vitamin C (ASCORBIC ACID) 500 MG tablet Take 500 mg by mouth daily.      Historical Provider, MD  warfarin (COUMADIN) 5 MG tablet Take 1.5 tablets (7.5 mg total) by mouth daily at 6 PM. OR AS DIRECTED by MD. 09/07/15   Modena Jansky, MD   BP 135/72 mmHg  Pulse 98  Temp(Src) 98.1 F (36.7 C) (Oral)  Resp 20  Ht 5\' 10"  (1.778 m)  Wt 225 lb (102.059 kg)  BMI 32.28 kg/m2  SpO2 96% Physical Exam  Constitutional: He is oriented to person, place, and time. He appears well-developed and well-nourished.  HENT:  Head: Normocephalic and atraumatic.  Eyes: EOM are normal.  Neck: Normal range of motion. Neck supple.  C-spine nontender.  Cardiovascular: Normal rate, regular rhythm, normal heart sounds and  intact distal pulses.   Pulmonary/Chest: Effort normal and breath sounds normal. No respiratory distress.  Abdominal: Soft. He exhibits no distension. There is no tenderness.  Musculoskeletal:  I'll painful range of motion of left knee with obvious left knee joint effusion present.  Incision is well-healed without surrounding erythema or warmth  Neurological: He is alert and oriented to person, place, and time.  Skin: Skin is warm and dry.  Psychiatric: He has a normal mood and affect. Judgment normal.  Nursing note and vitals reviewed.   ED Course  Procedures (including critical care time) Labs Review Labs Reviewed  PROTIME-INR - Abnormal; Notable for the following:    Prothrombin Time 31.9 (*)    INR 3.17 (*)    All other components within normal limits    Imaging Review Dg Knee Complete 4 Views Left  09/10/2015  CLINICAL DATA:  82 year old male with left knee pain and swelling. EXAM: LEFT KNEE - COMPLETE 4+ VIEW COMPARISON:  Radiograph dated 08/31/2015 FINDINGS: There is fold left knee arthroplasty in anatomic alignment. There is no evidence of loosening. There is no acute fracture or dislocation. The bones are mildly osteopenic. There is a large joint effusion similar to prior study. Subcutaneous edema is again noted. IMPRESSION: No acute fracture or dislocation. Left knee arthroplasty in stable positioning without evidence of loosening. Stable large joint effusion. Electronically Signed   By: Anner Crete M.D.   On: 09/10/2015 03:21   I have personally reviewed and evaluated these images and lab results as part of my medical decision-making.   EKG Interpretation None      MDM   Final diagnoses:  Fall, initial encounter  Left knee pain    No acute traumatic osseous injury from the fall tonight.  Pain treated.  No head injury.  C-spine nontender.  Discharge home in good condition.  Patient will continue to follow with his team in regards to his INR.  The patient will  need follow-up with his orthopedic surgeon if his knee continues to bother him.  No indication for additional treatment or management tonight in the emergency department.  Patient understands to return to the ER for new or worsening symptoms    Jola Schmidt, MD 09/10/15 [phone]

## 2015-09-14 DIAGNOSIS — J189 Pneumonia, unspecified organism: Secondary | ICD-10-CM | POA: Diagnosis not present

## 2015-09-14 DIAGNOSIS — M25562 Pain in left knee: Secondary | ICD-10-CM | POA: Diagnosis not present

## 2015-09-19 ENCOUNTER — Ambulatory Visit: Payer: Commercial Managed Care - HMO | Admitting: Cardiology

## 2015-09-22 DIAGNOSIS — Z5181 Encounter for therapeutic drug level monitoring: Secondary | ICD-10-CM | POA: Diagnosis not present

## 2015-09-22 DIAGNOSIS — J189 Pneumonia, unspecified organism: Secondary | ICD-10-CM | POA: Diagnosis not present

## 2015-09-22 DIAGNOSIS — Z7901 Long term (current) use of anticoagulants: Secondary | ICD-10-CM | POA: Diagnosis not present

## 2015-09-22 DIAGNOSIS — I4891 Unspecified atrial fibrillation: Secondary | ICD-10-CM | POA: Diagnosis not present

## 2015-09-22 DIAGNOSIS — Z96652 Presence of left artificial knee joint: Secondary | ICD-10-CM | POA: Diagnosis not present

## 2015-09-22 DIAGNOSIS — I10 Essential (primary) hypertension: Secondary | ICD-10-CM | POA: Diagnosis not present

## 2015-09-22 DIAGNOSIS — W19XXXD Unspecified fall, subsequent encounter: Secondary | ICD-10-CM | POA: Diagnosis not present

## 2015-09-22 DIAGNOSIS — Z6834 Body mass index (BMI) 34.0-34.9, adult: Secondary | ICD-10-CM | POA: Diagnosis not present

## 2015-09-22 DIAGNOSIS — E669 Obesity, unspecified: Secondary | ICD-10-CM | POA: Diagnosis not present

## 2015-09-23 ENCOUNTER — Encounter: Payer: Self-pay | Admitting: Cardiology

## 2015-09-23 ENCOUNTER — Ambulatory Visit (INDEPENDENT_AMBULATORY_CARE_PROVIDER_SITE_OTHER): Payer: Commercial Managed Care - HMO | Admitting: Cardiology

## 2015-09-23 VITALS — BP 120/64 | HR 91 | Ht 68.0 in | Wt 226.0 lb

## 2015-09-23 DIAGNOSIS — I10 Essential (primary) hypertension: Secondary | ICD-10-CM | POA: Diagnosis not present

## 2015-09-23 DIAGNOSIS — I2510 Atherosclerotic heart disease of native coronary artery without angina pectoris: Secondary | ICD-10-CM | POA: Diagnosis not present

## 2015-09-23 DIAGNOSIS — E785 Hyperlipidemia, unspecified: Secondary | ICD-10-CM | POA: Diagnosis not present

## 2015-09-23 DIAGNOSIS — I429 Cardiomyopathy, unspecified: Secondary | ICD-10-CM

## 2015-09-23 NOTE — Patient Instructions (Signed)
Medication Instructions:  Your physician recommends that you continue on your current medications as directed. Please refer to the Current Medication list given to you today.   Labwork: none  Testing/Procedures: Your physician has requested that you have an echocardiogram. Echocardiography is a painless test that uses sound waves to create images of your heart. It provides your doctor with information about the size and shape of your heart and how well your heart's chambers and valves are working. This procedure takes approximately one hour. There are no restrictions for this procedure.    Follow-Up: Your physician recommends that you schedule a follow-up appointment in: 8-10 weeks to review echo    Any Other Special Instructions Will Be Listed Below (If Applicable).     If you need a refill on your cardiac medications before your next appointment, please call your pharmacy.

## 2015-09-23 NOTE — Progress Notes (Signed)
Cardiology Office Note  Date: 09/23/2015   ID: [place], DOB 20-Mar-1935, MRN [id number]  PCP: Benjamin Neighbors, MD  Primary Cardiologist: Benjamin Lesches, MD   Chief Complaint  Patient presents with  . Coronary Artery Disease    History of Present Illness: Benjamin [place] is a 81 y.o. male last seen in March. I reviewed interval records. He underwent left total knee replacement in April of this year, was in short-term rehabilitation after that and discharged. Subsequently he presented to [facility] with worsening shortness of breath and leg edema culminating in respiratory failure requiring intubation. He was transferred to [facility]. He was diagnosed with pneumonia and managed with antibiotics. Follow-up echocardiogram reported LVEF 40-45% and he also had a component of acute on chronic systolic heart failure. He was seen by our cardiology service, underwent a Myoview study as detailed below. Images were reviewed by our cardiology team and felt to be most consistent with large region of scar in the apical anterior, apical inferior, and apex with no substantial degree of ischemia. LVEF by that study was 26%.  He comes in today for follow-up. Has been back at home, pending initiation of physical therapy here in [place]. [place]. He is using a wheelchair today, can walk but still has a lot of stiffness in his left knee. He does not report any angina and has NYHA class II dyspnea at this time. I discussed with him the results of the cardiac testing.  Coumadin is followed by Dr. [place]. He is not.report any bleeding problems. There is question of apical LV mural thrombus by echocardiography, although acoustic shadowing was present.  He underwent redo CABG in July 2016 as outlined below.  I reviewed his current medications which are outlined below.  Past Medical History  Diagnosis Date  . Coronary artery disease     Multivessel s/p CABG 1981 at [facility]  . Essential hypertension   .  Myocardial infarction ([place]) 1980  . Hematuria   . Peripheral vascular disease ([place])   . Polio osteopathy of lower leg ([place]) Age 53    Affected right leg  . TIA (transient ischemic attack) 1989    Chronic coumadin  . History of stroke   . Skin cancer   . High cholesterol   . History of pneumonia   . History of blood transfusion 07/2015    Knee replacement HgB <9  . Arthritis   . UTI (lower urinary tract infection)     Past Surgical History  Procedure Laterality Date  . Foot surgery Right 1954    [facility], muscle implantation  . Coronary artery bypass graft  1981    [facility]  . Cystoscopy with injection  10/10/2010    Procedure: CYSTOSCOPY WITH INJECTION;  Surgeon: Marissa Nestle;  [facility]: [facility];  Service: Urology;  Laterality: N/A;  with retrograde urethrogram  . Cardiac catheterization N/A 10/05/2014    Procedure: Left Heart Cath and Coronary Angiography;  Surgeon: Peter M Martinique, MD;  Location: [place] [facility];  Service: Cardiovascular;  Laterality: N/A;  . Coronary artery bypass graft N/A 10/08/2014    Procedure: REDO CORONARY ARTERY BYPASS GRAFTING (CABG) Times Two Grafts using Left Internal Mammary and Left Radial Artery;  Surgeon: Melrose Nakayama, MD;  Location: [place];  Service: Open Heart Surgery;  Laterality: N/A;  . Tee without cardioversion N/A 10/08/2014    Procedure: TRANSESOPHAGEAL ECHOCARDIOGRAM (TEE);  Surgeon: Melrose Nakayama, MD;  Location: [place];  Service:  Open Heart Surgery;  Laterality: N/A;  . Radial artery harvest Left 10/08/2014    Procedure: Left RADIAL ARTERY HARVEST;  Surgeon: Melrose Nakayama, MD;  Location: [place];  Service: Open Heart Surgery;  Laterality: Left;  . Total knee arthroplasty Left 07/27/2015    Procedure: TOTAL LEFT KNEE ARTHROPLASTY;  Surgeon: [name], MD;  Location: [place];  Service: Orthopedics;  Laterality: Left;  . Joint replacement    . Tonsillectomy  1949    [facility]    Current Outpatient Prescriptions  Medication  Sig Dispense Refill  . atenolol (TENORMIN) 50 MG tablet     . B Complex-C-Folic Acid (SUPER B-COMPLEX/VIT C/FA) TABS Take 1 tablet by mouth daily.      . diphenhydrAMINE (SOMINEX) 25 MG tablet Take 25 mg by mouth at bedtime as needed for sleep.    . folic acid (FOLVITE) Q000111Q MCG tablet Take 400 mcg by mouth daily.     . hydrochlorothiazide (HYDRODIURIL) 25 MG tablet Take 25 mg by mouth daily.    Marland Kitchen lisinopril (PRINIVIL,ZESTRIL) 10 MG tablet Take 0.5 tablets (5 mg total) by mouth daily. 30 tablet 0  . magnesium oxide (MAG-OX) 400 MG tablet Take 250 mg by mouth at bedtime.     . Misc Natural Products (URINOZINC PLUS PO) Take 1 tablet by mouth daily.    . Multiple Vitamins-Minerals (CERTAVITE/ANTIOXIDANTS) TABS Take 1 tablet by mouth daily.     Marland Kitchen oxyCODONE-acetaminophen (ROXICET) 5-325 MG tablet Take 1-2 tablets by mouth every 4 (four) hours as needed. 60 tablet 0  . polyethylene glycol (MIRALAX / GLYCOLAX) packet Take 17 g by mouth daily. 14 each 0  . senna (SENOKOT) 8.6 MG tablet Take 1 tablet by mouth 2 (two) times daily.     . simvastatin (ZOCOR) 40 MG tablet Take 40 mg by mouth daily.    . vitamin C (ASCORBIC ACID) 500 MG tablet Take 500 mg by mouth daily.      Marland Kitchen warfarin (COUMADIN) 5 MG tablet Take 1.5 tablets (7.5 mg total) by mouth daily at 6 PM. OR AS DIRECTED by MD. 60 tablet 0   No current facility-administered medications for this visit.   Allergies:  Review of patient's allergies indicates no known allergies.   Social History: The patient  reports that he quit smoking about 36 years ago. His smoking use included Cigarettes. He has a 28 pack-year smoking history. He has never used smokeless tobacco. He reports that he does not drink alcohol or use illicit drugs.   Family History: The patient's family history is negative for Anesthesia problems, Hypotension, Malignant hyperthermia, and Pseudochol deficiency.   ROS:  Please see the history of present illness. Otherwise, complete review  of systems is positive for left knee stiffness.  All other systems are reviewed and negative.   Physical Exam: VS:  BP 120/64 mmHg  Pulse 91  Ht 5\' 8"  (1.727 m)  Wt 226 lb (102.513 kg)  BMI 34.37 kg/m2  SpO2 97%, BMI Body mass index is 34.37 kg/(m^2).  Wt Readings from Last 3 Encounters:  09/23/15 226 lb (102.513 kg)  09/10/15 225 lb (102.059 kg)  09/06/15 223 lb 6.4 oz (101.334 kg)    General: Overweight male, appears comfortable at rest. HEENT: Conjunctiva and lids normal, oropharynx clear. Neck: Supple, no elevated JVP or carotid bruits, no thyromegaly. Lungs: Clear to auscultation, nonlabored breathing at rest. Thorax: Well-healed sternal incision. Cardiac: Regular rate and rhythm with ectopy, no S3, soft systolic murmur, no pericardial rub.  Abdomen: Soft, nontender, bowel sounds present. Extremities: Trace leg edema, distal pulses 2+. Skin: Warm and dry. Musculoskeletal: No kyphosis. Neuropsychiatric: Alert and oriented x3, affect grossly appropriate.  ECG: I personally reviewed the tracing from 08/31/2015 which showed sinus tachycardia with PVC.  Recent Labwork: 08/31/2015: B Natriuretic Peptide 349.0* 09/01/2015: Magnesium 2.0 09/02/2015: ALT 16*; AST 22; TSH 1.363 09/06/2015: BUN 17; Creatinine, Ser 0.83; Hemoglobin 10.0*; Platelets 164; Potassium 3.8; Sodium 138     Component Value Date/Time   CHOL 118* 03/05/2015 0834   TRIG 104 03/05/2015 0834   HDL 39* 03/05/2015 0834   CHOLHDL 3.0 03/05/2015 0834   VLDL 21 03/05/2015 0834   LDLCALC 58 03/05/2015 0834    Other Studies Reviewed Today:  Echocardiogram 08/31/2015: Study Conclusions  - Left ventricle: The cavity size was normal. Systolic function was  mildly to moderately reduced. The estimated ejection fraction was  in the range of 40% to 45%. There is akinesis of the entireapical  myocardium. There is akinesis of the apicalanterior and inferior  myocardium. Features are consistent with a pseudonormal left   ventricular filling pattern, with concomitant abnormal relaxation  and increased filling pressure (grade 2 diastolic dysfunction).  Acoustic contrast opacification revealed a possible,  apicalthrombus. - Mitral valve: There was mild regurgitation. - Pericardium, extracardiac: There was a left pleural effusion.  Lexiscan Myoview 09/03/2015: FINDINGS: Perfusion: There is a large fixed defect at the apex extending into the anterior and inferior walls compatible with old infarct/scar. Areas of decreased perfusion on the stress images in the adjacent anterior wall and lower septum concerning for peri-infarct ischemia.  Wall Motion: Severe diffuse hypokinesia with akinesia in the apex, anterior and inferior walls. Dilated left ventricle.  Left Ventricular Ejection Fraction: 26 %  End diastolic volume 123456 ml  End systolic volume 123XX123 ml  IMPRESSION: 1. Large fixed defect at the apex extending into the anterior and inferior walls. Concern for peri-infarct ischemia in the anterior wall and lower septum.  2. Severe diffuse hypokinesia with akinesia in the areas of scar.  3. Left ventricular ejection fraction 26%  4. Non invasive risk stratification*: High risk  Assessment and Plan:  1. Multivessel CAD status post redo CABG in July 2016. He has evidence of ischemic cardiomyopathy with decrease in LVEF based on his most recent testing, 26% by Myoview, but 40-45% by echocardiography with question of apical LV mural thrombus versus shadowing. He will continue on Coumadin for now. No changes made present cardiac regimen. We will follow-up echocardiogram in the next 8 weeks with clinical reevaluation. In the absence of accelerating angina, will continue with medical therapy for ischemic heart disease.  2. Patient with prior history of stroke, was on Coumadin for this initially. Follows with Dr. [place].  3. Essential hypertension, blood pressure control is good today.  4.  Hyperlipidemia, on Zocor. LDL 58 in December.  Current medicines were reviewed with the patient today.   Orders Placed This Encounter  Procedures  . ECHO COMPLETE    Disposition: Follow-up with me in 8 weeks.   Signed, Benjamin Sark, MD, [facility] 09/23/2015 9:15 AM    [facility] at [facility] [address] [place], [place] [address] Phone: [phone]; Fax: [phone]

## 2015-09-26 DIAGNOSIS — Z6834 Body mass index (BMI) 34.0-34.9, adult: Secondary | ICD-10-CM | POA: Diagnosis not present

## 2015-09-26 DIAGNOSIS — I10 Essential (primary) hypertension: Secondary | ICD-10-CM | POA: Diagnosis not present

## 2015-09-26 DIAGNOSIS — I4891 Unspecified atrial fibrillation: Secondary | ICD-10-CM | POA: Diagnosis not present

## 2015-09-26 DIAGNOSIS — W19XXXD Unspecified fall, subsequent encounter: Secondary | ICD-10-CM | POA: Diagnosis not present

## 2015-09-26 DIAGNOSIS — E669 Obesity, unspecified: Secondary | ICD-10-CM | POA: Diagnosis not present

## 2015-09-26 DIAGNOSIS — J189 Pneumonia, unspecified organism: Secondary | ICD-10-CM | POA: Diagnosis not present

## 2015-09-26 DIAGNOSIS — Z5181 Encounter for therapeutic drug level monitoring: Secondary | ICD-10-CM | POA: Diagnosis not present

## 2015-09-26 DIAGNOSIS — Z7901 Long term (current) use of anticoagulants: Secondary | ICD-10-CM | POA: Diagnosis not present

## 2015-09-26 DIAGNOSIS — Z96652 Presence of left artificial knee joint: Secondary | ICD-10-CM | POA: Diagnosis not present

## 2015-09-28 DIAGNOSIS — E669 Obesity, unspecified: Secondary | ICD-10-CM | POA: Diagnosis not present

## 2015-09-28 DIAGNOSIS — Z96652 Presence of left artificial knee joint: Secondary | ICD-10-CM | POA: Diagnosis not present

## 2015-09-28 DIAGNOSIS — I4891 Unspecified atrial fibrillation: Secondary | ICD-10-CM | POA: Diagnosis not present

## 2015-09-28 DIAGNOSIS — I10 Essential (primary) hypertension: Secondary | ICD-10-CM | POA: Diagnosis not present

## 2015-09-28 DIAGNOSIS — Z5181 Encounter for therapeutic drug level monitoring: Secondary | ICD-10-CM | POA: Diagnosis not present

## 2015-09-28 DIAGNOSIS — J189 Pneumonia, unspecified organism: Secondary | ICD-10-CM | POA: Diagnosis not present

## 2015-09-28 DIAGNOSIS — W19XXXD Unspecified fall, subsequent encounter: Secondary | ICD-10-CM | POA: Diagnosis not present

## 2015-09-28 DIAGNOSIS — Z6834 Body mass index (BMI) 34.0-34.9, adult: Secondary | ICD-10-CM | POA: Diagnosis not present

## 2015-09-28 DIAGNOSIS — Z7901 Long term (current) use of anticoagulants: Secondary | ICD-10-CM | POA: Diagnosis not present

## 2015-09-29 DIAGNOSIS — E669 Obesity, unspecified: Secondary | ICD-10-CM | POA: Diagnosis not present

## 2015-09-29 DIAGNOSIS — Z6834 Body mass index (BMI) 34.0-34.9, adult: Secondary | ICD-10-CM | POA: Diagnosis not present

## 2015-09-29 DIAGNOSIS — I4891 Unspecified atrial fibrillation: Secondary | ICD-10-CM | POA: Diagnosis not present

## 2015-09-29 DIAGNOSIS — Z7901 Long term (current) use of anticoagulants: Secondary | ICD-10-CM | POA: Diagnosis not present

## 2015-09-29 DIAGNOSIS — Z5181 Encounter for therapeutic drug level monitoring: Secondary | ICD-10-CM | POA: Diagnosis not present

## 2015-09-29 DIAGNOSIS — J189 Pneumonia, unspecified organism: Secondary | ICD-10-CM | POA: Diagnosis not present

## 2015-09-29 DIAGNOSIS — W19XXXD Unspecified fall, subsequent encounter: Secondary | ICD-10-CM | POA: Diagnosis not present

## 2015-09-29 DIAGNOSIS — Z96652 Presence of left artificial knee joint: Secondary | ICD-10-CM | POA: Diagnosis not present

## 2015-09-29 DIAGNOSIS — I10 Essential (primary) hypertension: Secondary | ICD-10-CM | POA: Diagnosis not present

## 2015-09-30 DIAGNOSIS — I639 Cerebral infarction, unspecified: Secondary | ICD-10-CM | POA: Diagnosis not present

## 2015-10-04 DIAGNOSIS — Z7901 Long term (current) use of anticoagulants: Secondary | ICD-10-CM | POA: Diagnosis not present

## 2015-10-04 DIAGNOSIS — Z96652 Presence of left artificial knee joint: Secondary | ICD-10-CM | POA: Diagnosis not present

## 2015-10-04 DIAGNOSIS — Z6834 Body mass index (BMI) 34.0-34.9, adult: Secondary | ICD-10-CM | POA: Diagnosis not present

## 2015-10-04 DIAGNOSIS — Z5181 Encounter for therapeutic drug level monitoring: Secondary | ICD-10-CM | POA: Diagnosis not present

## 2015-10-04 DIAGNOSIS — I10 Essential (primary) hypertension: Secondary | ICD-10-CM | POA: Diagnosis not present

## 2015-10-04 DIAGNOSIS — I4891 Unspecified atrial fibrillation: Secondary | ICD-10-CM | POA: Diagnosis not present

## 2015-10-04 DIAGNOSIS — J189 Pneumonia, unspecified organism: Secondary | ICD-10-CM | POA: Diagnosis not present

## 2015-10-04 DIAGNOSIS — E669 Obesity, unspecified: Secondary | ICD-10-CM | POA: Diagnosis not present

## 2015-10-04 DIAGNOSIS — W19XXXD Unspecified fall, subsequent encounter: Secondary | ICD-10-CM | POA: Diagnosis not present

## 2015-10-05 DIAGNOSIS — Z6834 Body mass index (BMI) 34.0-34.9, adult: Secondary | ICD-10-CM | POA: Diagnosis not present

## 2015-10-05 DIAGNOSIS — Z7901 Long term (current) use of anticoagulants: Secondary | ICD-10-CM | POA: Diagnosis not present

## 2015-10-05 DIAGNOSIS — Z5181 Encounter for therapeutic drug level monitoring: Secondary | ICD-10-CM | POA: Diagnosis not present

## 2015-10-05 DIAGNOSIS — I10 Essential (primary) hypertension: Secondary | ICD-10-CM | POA: Diagnosis not present

## 2015-10-05 DIAGNOSIS — I4891 Unspecified atrial fibrillation: Secondary | ICD-10-CM | POA: Diagnosis not present

## 2015-10-05 DIAGNOSIS — W19XXXD Unspecified fall, subsequent encounter: Secondary | ICD-10-CM | POA: Diagnosis not present

## 2015-10-05 DIAGNOSIS — J189 Pneumonia, unspecified organism: Secondary | ICD-10-CM | POA: Diagnosis not present

## 2015-10-05 DIAGNOSIS — E669 Obesity, unspecified: Secondary | ICD-10-CM | POA: Diagnosis not present

## 2015-10-05 DIAGNOSIS — Z96652 Presence of left artificial knee joint: Secondary | ICD-10-CM | POA: Diagnosis not present

## 2015-10-07 DIAGNOSIS — Z6834 Body mass index (BMI) 34.0-34.9, adult: Secondary | ICD-10-CM | POA: Diagnosis not present

## 2015-10-07 DIAGNOSIS — Z96652 Presence of left artificial knee joint: Secondary | ICD-10-CM | POA: Diagnosis not present

## 2015-10-07 DIAGNOSIS — W19XXXD Unspecified fall, subsequent encounter: Secondary | ICD-10-CM | POA: Diagnosis not present

## 2015-10-07 DIAGNOSIS — J189 Pneumonia, unspecified organism: Secondary | ICD-10-CM | POA: Diagnosis not present

## 2015-10-07 DIAGNOSIS — E669 Obesity, unspecified: Secondary | ICD-10-CM | POA: Diagnosis not present

## 2015-10-07 DIAGNOSIS — Z7901 Long term (current) use of anticoagulants: Secondary | ICD-10-CM | POA: Diagnosis not present

## 2015-10-07 DIAGNOSIS — I4891 Unspecified atrial fibrillation: Secondary | ICD-10-CM | POA: Diagnosis not present

## 2015-10-07 DIAGNOSIS — Z5181 Encounter for therapeutic drug level monitoring: Secondary | ICD-10-CM | POA: Diagnosis not present

## 2015-10-07 DIAGNOSIS — I10 Essential (primary) hypertension: Secondary | ICD-10-CM | POA: Diagnosis not present

## 2015-10-18 ENCOUNTER — Ambulatory Visit (HOSPITAL_COMMUNITY): Payer: Commercial Managed Care - HMO | Attending: Orthopedic Surgery | Admitting: Physical Therapy

## 2015-10-18 DIAGNOSIS — M6281 Muscle weakness (generalized): Secondary | ICD-10-CM | POA: Diagnosis not present

## 2015-10-18 DIAGNOSIS — R600 Localized edema: Secondary | ICD-10-CM | POA: Insufficient documentation

## 2015-10-18 DIAGNOSIS — R262 Difficulty in walking, not elsewhere classified: Secondary | ICD-10-CM | POA: Diagnosis not present

## 2015-10-18 DIAGNOSIS — R2681 Unsteadiness on feet: Secondary | ICD-10-CM | POA: Diagnosis not present

## 2015-10-18 DIAGNOSIS — M25662 Stiffness of left knee, not elsewhere classified: Secondary | ICD-10-CM

## 2015-10-18 NOTE — Therapy (Signed)
[place], [place], [address] Phone: [phone]   Fax:  [phone]  Physical Therapy Evaluation  Patient Details  Name: [place] MRN: [id number] Date of Birth: April 29, 1934 Referring Provider: Kathryne Hitch   Encounter Date: 10/18/2015      PT End of Session - 10/18/15 1217    Visit Number 1   Number of Visits 18   Date for PT Re-Evaluation 11/08/15   Authorization Type [employer]    Authorization Time Period 10/18/15 to 11/29/15   Authorization - Visit Number 1   Authorization - Number of Visits 10   PT Start Time 1118   PT Stop Time 1158   PT Time Calculation (min) 40 min   Activity Tolerance Patient tolerated treatment well;Treatment limited secondary to medical complications (Comment)  poor cardiopulmonary endurance since surgery last year   Behavior During Therapy [name] for tasks assessed/performed      Past Medical History  Diagnosis Date  . Coronary artery disease     Multivessel s/p CABG [facility] at [facility]  . Essential hypertension   . Myocardial infarction ([place]) 1980  . Hematuria   . Peripheral vascular disease ([place])   . Polio osteopathy of lower leg ([place]) Age 6    Affected right leg  . TIA (transient ischemic attack) 1989    Chronic coumadin  . History of stroke   . Skin cancer   . High cholesterol   . History of pneumonia   . History of blood transfusion 07/2015    Knee replacement HgB <9  . Arthritis   . UTI (lower urinary tract infection)     Past Surgical History  Procedure Laterality Date  . Foot surgery Right 1954    [facility], muscle implantation  . Coronary artery bypass graft  1981    [facility]  . Cystoscopy with injection  10/10/2010    Procedure: CYSTOSCOPY WITH INJECTION;  Surgeon: Marissa Nestle;  [facility];  Service: Urology;  Laterality: N/A;  with retrograde urethrogram  . Cardiac catheterization N/A 10/05/2014    Procedure: Left Heart Cath and  Coronary Angiography;  Surgeon: Peter M Martinique, MD;  Location: [place] [facility];  Service: Cardiovascular;  Laterality: N/A;  . Coronary artery bypass graft N/A 10/08/2014    Procedure: REDO CORONARY ARTERY BYPASS GRAFTING (CABG) Times Two Grafts using Left Internal Mammary and Left Radial Artery;  Surgeon: Melrose Nakayama, MD;  Location: [place];  Service: Open Heart Surgery;  Laterality: N/A;  . Tee without cardioversion N/A 10/08/2014    Procedure: TRANSESOPHAGEAL ECHOCARDIOGRAM (TEE);  Surgeon: Melrose Nakayama, MD;  Location: [place];  Service: Open Heart Surgery;  Laterality: N/A;  . Radial artery harvest Left 10/08/2014    Procedure: Left RADIAL ARTERY HARVEST;  Surgeon: Melrose Nakayama, MD;  Location: [place];  Service: Open Heart Surgery;  Laterality: Left;  . Total knee arthroplasty Left 07/27/2015    Procedure: TOTAL LEFT KNEE ARTHROPLASTY;  Surgeon: [name], MD;  Location: [place];  Service: Orthopedics;  Laterality: Left;  . Joint replacement    . Tonsillectomy  1949    [facility]    There were no vitals filed for this visit.       Subjective Assessment - 10/18/15 1124    Subjective Patient had a L TKR done on April the 26th by Dr. Percell Miller; he went to skilled care at [facility] then went to [facility] after that,  which has since been discharged. Patient states that walking is the hardest thing for him to do right now, he needs a crutch/walker  to ambulate; he states that back in 2016 he was able to walk without any device but after going to the hospital last year he got weak and has needed the crutch. He states otherwise he can do what he wants to do, can get up steps OK. He fell on June 9th when he was getting home from visit to his doctor, and his walker scooted out from under him.    Pertinent History chronic warfarin use, on beta blockers, extensive cardiac history including CAD/PVD/multiple CABG procedures/HTN/etc, hx respiratory failure, OA, coagulopathy, MI, TIA, CVA, polio,  history of R foot surgery, HOH    How long can you sit comfortably? unlimited    How long can you stand comfortably? 15-20 minutes    How long can you walk comfortably? with crutch, 200-376ft    Patient Stated Goals get stronger, and get his walking better    Currently in Pain? No/denies            [facility] PT Assessment - 10/18/15 0001    Assessment   Medical Diagnosis L TKR    Referring Provider Kathryne Hitch    Onset Date/Surgical Date 07/29/15   Next MD Visit Dr. Percell Miller on 7/25    Balance Screen   Has the patient fallen in the past 6 months Yes   How many times? 1   Has the patient had a decrease in activity level because of a fear of falling?  Yes   Is the patient reluctant to leave their home because of a fear of falling?  Yes   Prior Function   Level of Independence Independent;Independent with basic ADLs;Independent with gait;Independent with transfers   Vocation Retired   Leisure bowling and playing golf    Observation/Other Assessments   Observations localized edema noted L LE    Focus on Therapeutic Outcomes (FOTO)  56% limited    AROM   Left Knee Extension 10   Left Knee Flexion 113   Strength   Right Hip Flexion 4/5   Right Hip Extension --  unable to lay on stomach    Right Hip ABduction 3/5   Left Hip Flexion 4/5   Left Hip Extension --  unable to lay on stomach    Left Hip ABduction 4/5   Right Knee Flexion 3/5   Right Knee Extension 4-/5   Left Knee Flexion 4/5   Left Knee Extension 4/5   Right Ankle Dorsiflexion 0/5   Left Ankle Dorsiflexion 5/5   Ambulation/Gait   Gait Comments foot drop/generalized weakness R LE due to hx of polio; reduced TKE L LE, reduced heel toe; reduced gait speed; limited by cardiopulm endurance    6 minute walk test results    Aerobic Endurance Distance Walked 72   Endurance additional comments 3MWT, crutch    High Level Balance   High Level Balance Comments TUG 26.26 crutch                             PT Education - 10/18/15 1215    Education provided Yes   Education Details prognosis, POC, HEP    Person(s) Educated Patient   Methods Explanation;Demonstration;Handout   Comprehension Verbalized understanding;Returned demonstration;Need further instruction          PT Short Term Goals - 10/18/15 1223  PT SHORT TERM GOAL #1   Title Patient will demonstrate ROM L knee 0-120 degrees in order to improve gait and overall mobility    Time 3   Period Weeks   Status New   PT SHORT TERM GOAL #2   Title Patient to be able to ambulate with LRAD with correct gait mechanics and minimal unsteadiness to demonstrate improvement of condition    Time 3   Period Weeks   Status New   PT SHORT TERM GOAL #3   Title Patient to be able to tolerate 5-10 minutes of activity without SOB/DOE in order to demonstrate improved strength and functional activity tolerance    Time 3   Period Weeks   Status New   PT SHORT TERM GOAL #4   Title Patient to be correctly and consistently performing appropriate HEP, to be updated PRN    Time 3   Period Weeks   Status New           PT Long Term Goals - 10/18/15 1224    PT LONG TERM GOAL #1   Title Patient to demonstrate strength in all tested muscles 4/5 R LE and 5/5 L LE in order to improve function and gait    Time 6   Period Weeks   Status New   PT LONG TERM GOAL #2   Title Patient to be able to ambulate at least house hold distances with no device and no DOE/SOB in order to demonstrate improved function and tolerance to activity    Time 6   Period Weeks   Status New   PT LONG TERM GOAL #3   Title Patient to report he has been able to return to gym for low intensity activities in short intervals with no DOE/SOB to demonstrate improvement in mobilty and function    Time 6   Period Weeks   Status New   PT LONG TERM GOAL #4   Title Patient to be able to reciprocally ascend/descent full flight of stairs with U railing, step over step pattern, good  eccentric control, and minimal unsteadiness in order to improve mobility within community    Time 6   Period Weeks   Status New               Plan - 10/18/15 1219    Clinical Impression Statement Patient arrives s/p L TKR which he reports was performed in April of this year; he went to a SNF for 8 days and then proceeded to [facility], which has since been discharged and he reports that he is keeping up with their HEP program. Patient had polio as a child and shows residual effects in R LE with muscle atrophy and steppage gait pattern/R foot drop; he states he has tried AFO for his R LE but did not like it and so does not wear it. Upon examination patient reveals L knee stiffness, gait impairment, functional muscle weakness, localized edema, and reduced functional activity tolerance at this time. He will benefit from skilled PT services in order to address functional limitations and assist in reaching optimal level of function.    Rehab Potential Good   Clinical Impairments Affecting Rehab Potential extensive cardiac history with severe cardiopulmonary limitations, history of polio affecting R LE    PT Frequency 3x / week   PT Duration 6 weeks   PT Treatment/Interventions ADLs/Self Care Home Management;Cryotherapy;Moist Heat;DME Instruction;Gait training;Stair training;Functional mobility training;Therapeutic activities;Therapeutic exercise;Balance training;Neuromuscular re-education;Patient/family education;Manual techniques;Scar mobilization;Passive range of motion;Energy conservation;Taping  PT Next Visit Plan review initial eval and goals; focus on ROM, balance, gait, edema, progress strength as ROM improves    PT Home Exercise Plan HHPT HEP for now    Consulted and Agree with Plan of Care Patient      Patient will benefit from skilled therapeutic intervention in order to improve the following deficits and impairments:  Abnormal gait, Decreased endurance, Hypomobility, Decreased scar  mobility, Decreased activity tolerance, Decreased strength, Pain, Decreased balance, Decreased mobility, Difficulty walking, Decreased range of motion, Improper body mechanics, Decreased coordination, Impaired flexibility, Postural dysfunction  Visit Diagnosis: Stiffness of left knee, not elsewhere classified - Plan: PT plan of care cert/re-cert  Localized edema - Plan: PT plan of care cert/re-cert  Muscle weakness (generalized) - Plan: PT plan of care cert/re-cert  Difficulty in walking, not elsewhere classified - Plan: PT plan of care cert/re-cert  Unsteadiness on feet - Plan: PT plan of care cert/re-cert      G-Codes - [id number] July 16, 1226    Functional Assessment Tool Used Based on skilled clinical assessment of strength, gait, balance, ROM, cardiopulmonary endurance    Functional Limitation Mobility: Walking and moving around   Mobility: Walking and Moving Around Current Status [id number]) At least 40 percent but less than 60 percent impaired, limited or restricted   Mobility: Walking and Moving Around Goal Status [id number]) At least 20 percent but less than 40 percent impaired, limited or restricted       Problem List Patient Active Problem List   Diagnosis Date Noted  . Subtherapeutic anticoagulation   . Acute on chronic respiratory failure with hypoxia and hypercapnia (HCC)   . Acute on chronic combined systolic and diastolic CHF (congestive heart failure) ([place])   . LV (left ventricular) mural thrombus ([place]) 09/03/2015  . Systolic and diastolic CHF, acute on chronic ([place])   . Hypokalemia   . CHF (congestive heart failure) ([place]) 09/01/2015  . Respiratory failure with hypoxia ([place]) 08/31/2015  . HCAP (healthcare-associated pneumonia) 08/31/2015  . Acute respiratory failure with hypoxia ([place])   . Encounter for central line placement   . Knee swelling   . Subtherapeutic international normalized ratio (INR) 08/08/2015  . Constipation 08/08/2015  . Hyperlipidemia 08/08/2015  . Blurred  vision 07/29/2015  . Anemia 07/29/2015  . S/P total knee replacement 07/27/2015  . S/P CABG x 2 10/08/2014  . Chest pain 10/01/2014  . Elevated INR 10/01/2014  . Leukocytosis 10/01/2014  . Thrombocytopenia ([place]) 10/01/2014  . Chest pain at rest 10/01/2014  . Unstable angina ([place]) 10/01/2014  . Coagulopathy ([place]) 10/01/2014  . Paroxysmal atrial fibrillation ([place]) 10/01/2014  . Coronary artery disease   . Hypertension   . Arthritis   . Peripheral vascular disease ([place])   . Essential hypertension     Deniece Ree PT, DPT [facility] [address] [place], [place], [address] Phone: [phone]   Fax:  [phone]  Name: [place] MRN: [id number] Date of Birth: [birth year]

## 2015-10-19 ENCOUNTER — Ambulatory Visit (HOSPITAL_COMMUNITY): Payer: Commercial Managed Care - HMO | Admitting: Physical Therapy

## 2015-10-19 DIAGNOSIS — R600 Localized edema: Secondary | ICD-10-CM

## 2015-10-19 DIAGNOSIS — R262 Difficulty in walking, not elsewhere classified: Secondary | ICD-10-CM | POA: Diagnosis not present

## 2015-10-19 DIAGNOSIS — M25662 Stiffness of left knee, not elsewhere classified: Secondary | ICD-10-CM | POA: Diagnosis not present

## 2015-10-19 DIAGNOSIS — M6281 Muscle weakness (generalized): Secondary | ICD-10-CM

## 2015-10-19 DIAGNOSIS — R2681 Unsteadiness on feet: Secondary | ICD-10-CM

## 2015-10-19 NOTE — Patient Instructions (Signed)
Functional Quadriceps: Sit to Stand    Sit on edge of chair, feet flat on floor. Stand upright, extending knees fully.Repeat _10_ times per set. Do _2_ sets per session. Do _2_ sessions per day.  Straight Leg Raise    Tighten stomach and slowly raise locked right leg _15___ inches from floor. Repeat _20__ times per set. Do _2__ sessions per day.   Bridge [employer] small of back into mat, maintain pelvic tilt, roll up one vertebrae at a time. Focus on engaging posterior hip muscles. Complete 20 reps  Hold for ____ breaths. Repeat ____ times.  Copyright  VHI. All rights reserved.

## 2015-10-19 NOTE — Therapy (Signed)
[place], [place], [address] Phone: [phone]   Fax:  [phone]  Physical Therapy Treatment  Patient Details  Name: Benjamin Cordova MRN: [id number] Date of Birth: 07/28/1934 Referring Provider: Kathryne Hitch   Encounter Date: 10/19/2015      PT End of Session - 10/19/15 1431    Visit Number 2   Number of Visits 18   Date for PT Re-Evaluation 11/08/15   Authorization Type [employer]    Authorization Time Period 10/18/15 to 11/29/15   Authorization - Visit Number 2   Authorization - Number of Visits 10   PT Start Time 1355   PT Stop Time 1435   PT Time Calculation (min) 40 min   Activity Tolerance Patient tolerated treatment well;Treatment limited secondary to medical complications (Comment)  poor cardiopulmonary endurance since surgery last year   Behavior During Therapy [facility] for tasks assessed/performed      Past Medical History  Diagnosis Date  . Coronary artery disease     Multivessel s/p CABG 1981 at [facility]  . Essential hypertension   . Myocardial infarction ([place]) 1980  . Hematuria   . Peripheral vascular disease ([place])   . Polio osteopathy of lower leg ([place]) Age 81    Affected right leg  . TIA (transient ischemic attack) 1989    Chronic coumadin  . History of stroke   . Skin cancer   . High cholesterol   . History of pneumonia   . History of blood transfusion 07/2015    Knee replacement HgB <9  . Arthritis   . UTI (lower urinary tract infection)     Past Surgical History  Procedure Laterality Date  . Foot surgery Right 1954    [facility], muscle implantation  . Coronary artery bypass graft  1981    [facility]  . Cystoscopy with injection  10/10/2010    Procedure: CYSTOSCOPY WITH INJECTION;  Surgeon: Marissa Nestle;  [facility]: [facility];  Service: Urology;  Laterality: N/A;  with retrograde urethrogram  . Cardiac catheterization N/A 10/05/2014    Procedure: Left Heart Cath and  Coronary Angiography;  Surgeon: Peter M Martinique, MD;  Location: [place] [facility];  Service: Cardiovascular;  Laterality: N/A;  . Coronary artery bypass graft N/A 10/08/2014    Procedure: REDO CORONARY ARTERY BYPASS GRAFTING (CABG) Times Two Grafts using Left Internal Mammary and Left Radial Artery;  Surgeon: Melrose Nakayama, MD;  Location: [place];  Service: Open Heart Surgery;  Laterality: N/A;  . Tee without cardioversion N/A 10/08/2014    Procedure: TRANSESOPHAGEAL ECHOCARDIOGRAM (TEE);  Surgeon: Melrose Nakayama, MD;  Location: [place];  Service: Open Heart Surgery;  Laterality: N/A;  . Radial artery harvest Left 10/08/2014    Procedure: Left RADIAL ARTERY HARVEST;  Surgeon: Melrose Nakayama, MD;  Location: [place];  Service: Open Heart Surgery;  Laterality: Left;  . Total knee arthroplasty Left 07/27/2015    Procedure: TOTAL LEFT KNEE ARTHROPLASTY;  Surgeon: [name], MD;  Location: [place];  Service: Orthopedics;  Laterality: Left;  . Joint replacement    . Tonsillectomy  1949    [facility]    There were no vitals filed for this visit.      Subjective Assessment - 10/19/15 1625    Subjective No pain currently just tired.  STates he's been doing his HEP given to him by HHPT.  States his swelling is getting much better as well.  [place] Adult PT Treatment/Exercise - 10/19/15 0001    Knee/Hip Exercises: Seated   Sit to Sand 10 reps;without UE support   Knee/Hip Exercises: Supine   Quad Sets Strengthening;10 reps;Left   Short Arc [employer] Both   Short Arc Quad Sets Limitations B with 5#   Heel Slides Left;10 reps   Bridges Both;10 reps   Straight Leg Raises Both;10 reps                PT Education - 10/19/15 1411    Education provided Yes   Education Details initial evaluation with goals reviewed, updated HEP to include STS, bridge, heelslides, and SLR   Person(s) Educated Patient   Methods Demonstration;Verbal  cues;Handout;Explanation;Tactile cues   Comprehension Verbalized understanding;Returned demonstration;Verbal cues required;Tactile cues required          PT Short Term Goals - 10/18/15 1223    PT SHORT TERM GOAL #1   Title Patient will demonstrate ROM L knee 0-120 degrees in order to improve gait and overall mobility    Time 3   Period Weeks   Status New   PT SHORT TERM GOAL #2   Title Patient to be able to ambulate with LRAD with correct gait mechanics and minimal unsteadiness to demonstrate improvement of condition    Time 3   Period Weeks   Status New   PT SHORT TERM GOAL #3   Title Patient to be able to tolerate 5-10 minutes of activity without SOB/DOE in order to demonstrate improved strength and functional activity tolerance    Time 3   Period Weeks   Status New   PT SHORT TERM GOAL #4   Title Patient to be correctly and consistently performing appropriate HEP, to be updated PRN    Time 3   Period Weeks   Status New           PT Long Term Goals - 10/18/15 1224    PT LONG TERM GOAL #1   Title Patient to demonstrate strength in all tested muscles 4/5 R LE and 5/5 L LE in order to improve function and gait    Time 6   Period Weeks   Status New   PT LONG TERM GOAL #2   Title Patient to be able to ambulate at least house hold distances with no device and no DOE/SOB in order to demonstrate improved function and tolerance to activity    Time 6   Period Weeks   Status New   PT LONG TERM GOAL #3   Title Patient to report he has been able to return to gym for low intensity activities in short intervals with no DOE/SOB to demonstrate improvement in mobilty and function    Time 6   Period Weeks   Status New   PT LONG TERM GOAL #4   Title Patient to be able to reciprocally ascend/descent full flight of stairs with U railing, step over step pattern, good eccentric control, and minimal unsteadiness in order to improve mobility within community    Time 6   Period Weeks    Status New               Plan - 10/19/15 1431    Clinical Impression Statement Pt with reported compliance with HEP.   Pt able to complete 100 feet ambulation before reported the need for a rest break.  Added new exercises to increase LE strength.  Myofascial and scar release techniques completed to Lt knee as well  as retro massage to decrease edema.  Pt reported overall relief at end of session.     Rehab Potential Good   Clinical Impairments Affecting Rehab Potential extensive cardiac history with severe cardiopulmonary limitations, history of polio affecting R LE.    PT Frequency 3x / week   PT Duration 6 weeks   PT Treatment/Interventions ADLs/Self Care Home Management;Cryotherapy;Moist Heat;DME Instruction;Gait training;Stair training;Functional mobility training;Therapeutic activities;Therapeutic exercise;Balance training;Neuromuscular re-education;Patient/family education;Manual techniques;Scar mobilization;Passive range of motion;Energy conservation;Taping   PT Next Visit Plan Continue focus on ROM, balance, gait, edema, progress strength as ROM improves. Begin standing exercises,i.e. modified squats and lunges.  Add nustep at EOS to work up activity tolerance.     PT Home Exercise Plan HHPT HEP for now    Consulted and Agree with Plan of Care Patient      Patient will benefit from skilled therapeutic intervention in order to improve the following deficits and impairments:  Abnormal gait, Decreased endurance, Hypomobility, Decreased scar mobility, Decreased activity tolerance, Decreased strength, Pain, Decreased balance, Decreased mobility, Difficulty walking, Decreased range of motion, Improper body mechanics, Decreased coordination, Impaired flexibility, Postural dysfunction  Visit Diagnosis: Stiffness of left knee, not elsewhere classified  Localized edema  Muscle weakness (generalized)  Difficulty in walking, not elsewhere classified  Unsteadiness on feet        G-Codes - November 01, 2015 1228    Functional Assessment Tool Used Based on skilled clinical assessment of strength, gait, balance, ROM, cardiopulmonary endurance    Functional Limitation Mobility: Walking and moving around   Mobility: Walking and Moving Around Current Status [phone]) At least 40 percent but less than 60 percent impaired, limited or restricted   Mobility: Walking and Moving Around Goal Status [phone]) At least 20 percent but less than 40 percent impaired, limited or restricted      Problem List Patient Active Problem List   Diagnosis Date Noted  . Subtherapeutic anticoagulation   . Acute on chronic respiratory failure with hypoxia and hypercapnia (HCC)   . Acute on chronic combined systolic and diastolic CHF (congestive heart failure) ([place])   . LV (left ventricular) mural thrombus ([place]) 09/03/2015  . Systolic and diastolic CHF, acute on chronic ([place])   . Hypokalemia   . CHF (congestive heart failure) ([place]) 09/01/2015  . Respiratory failure with hypoxia ([place]) 08/31/2015  . HCAP (healthcare-associated pneumonia) 08/31/2015  . Acute respiratory failure with hypoxia ([place])   . Encounter for central line placement   . Knee swelling   . Subtherapeutic international normalized ratio (INR) 08/08/2015  . Constipation 08/08/2015  . Hyperlipidemia 08/08/2015  . Blurred vision 07/29/2015  . Anemia 07/29/2015  . S/P total knee replacement 07/27/2015  . S/P CABG x 2 10/08/2014  . Chest pain 10/01/2014  . Elevated INR 10/01/2014  . Leukocytosis 10/01/2014  . Thrombocytopenia ([place]) 10/01/2014  . Chest pain at rest 10/01/2014  . Unstable angina ([place]) 10/01/2014  . Coagulopathy ([place]) 10/01/2014  . Paroxysmal atrial fibrillation ([place]) 10/01/2014  . Coronary artery disease   . Hypertension   . Arthritis   . Peripheral vascular disease ([place])   . Essential hypertension     Teena Irani, PTA/CLT [phone]  10/19/2015, 4:39 PM  [facility] [address] [place], [place], [address] Phone: [phone]   Fax:  [phone]  Name: Benjamin Cordova MRN: [id number] Date of Birth: May 27, 1934

## 2015-10-20 ENCOUNTER — Ambulatory Visit (HOSPITAL_COMMUNITY): Payer: Commercial Managed Care - HMO | Admitting: Physical Therapy

## 2015-10-20 DIAGNOSIS — R600 Localized edema: Secondary | ICD-10-CM

## 2015-10-20 DIAGNOSIS — M6281 Muscle weakness (generalized): Secondary | ICD-10-CM

## 2015-10-20 DIAGNOSIS — R262 Difficulty in walking, not elsewhere classified: Secondary | ICD-10-CM

## 2015-10-20 DIAGNOSIS — M25662 Stiffness of left knee, not elsewhere classified: Secondary | ICD-10-CM

## 2015-10-20 DIAGNOSIS — R2681 Unsteadiness on feet: Secondary | ICD-10-CM | POA: Diagnosis not present

## 2015-10-20 NOTE — Therapy (Signed)
[place], [place], [address] Phone: [phone]   Fax:  [phone]  Physical Therapy Treatment  Patient Details  Name: Benjamin Cordova MRN: [id number] Date of Birth: 04-02-1935 Referring Provider: Kathryne Hitch   Encounter Date: 10/20/2015      PT End of Session - 10/20/15 1227    Visit Number 3   Number of Visits 18   Date for PT Re-Evaluation 11/08/15   Authorization Type [employer] Medicare    Authorization Time Period 10/18/15 to 11/29/15   Authorization - Visit Number 3   Authorization - Number of Visits 10   PT Start Time 1115   PT Stop Time 1158   PT Time Calculation (min) 43 min   Activity Tolerance Patient tolerated treatment well   Behavior During Therapy [facility] for tasks assessed/performed      Past Medical History  Diagnosis Date  . Coronary artery disease     Multivessel s/p CABG [facility] at [facility]  . Essential hypertension   . Myocardial infarction ([place]) 1980  . Hematuria   . Peripheral vascular disease ([place])   . Polio osteopathy of lower leg ([place]) Age 64    Affected right leg  . TIA (transient ischemic attack) 1989    Chronic coumadin  . History of stroke   . Skin cancer   . High cholesterol   . History of pneumonia   . History of blood transfusion 07/2015    Knee replacement HgB <9  . Arthritis   . UTI (lower urinary tract infection)     Past Surgical History  Procedure Laterality Date  . Foot surgery Right 1954    [facility], muscle implantation  . Coronary artery bypass graft  1981    [facility]  . Cystoscopy with injection  10/10/2010    Procedure: CYSTOSCOPY WITH INJECTION;  Surgeon: Marissa Nestle;  [facility];  Service: Urology;  Laterality: N/A;  with retrograde urethrogram  . Cardiac catheterization N/A 10/05/2014    Procedure: Left Heart Cath and Coronary Angiography;  Surgeon: Peter M Martinique, MD;  Location: [place] [facility];  Service: Cardiovascular;  Laterality:  N/A;  . Coronary artery bypass graft N/A 10/08/2014    Procedure: REDO CORONARY ARTERY BYPASS GRAFTING (CABG) Times Two Grafts using Left Internal Mammary and Left Radial Artery;  Surgeon: Melrose Nakayama, MD;  Location: [place];  Service: Open Heart Surgery;  Laterality: N/A;  . Tee without cardioversion N/A 10/08/2014    Procedure: TRANSESOPHAGEAL ECHOCARDIOGRAM (TEE);  Surgeon: Melrose Nakayama, MD;  Location: [place];  Service: Open Heart Surgery;  Laterality: N/A;  . Radial artery harvest Left 10/08/2014    Procedure: Left RADIAL ARTERY HARVEST;  Surgeon: Melrose Nakayama, MD;  Location: [place];  Service: Open Heart Surgery;  Laterality: Left;  . Total knee arthroplasty Left 07/27/2015    Procedure: TOTAL LEFT KNEE ARTHROPLASTY;  Surgeon: [name], MD;  Location: [place];  Service: Orthopedics;  Laterality: Left;  . Joint replacement    . Tonsillectomy  1949    [facility]    There were no vitals filed for this visit.      Subjective Assessment - 10/20/15 1119    Subjective Pt reports he has been to PT the past couple of days and his leg is a little tired. He wasn't able to ice his knee yesterday because he has to go with his wife to dialysis.    Pertinent History  chronic warfarin use, on beta blockers, extensive cardiac history including CAD/PVD/multiple CABG procedures/HTN/etc, hx respiratory failure, OA, coagulopathy, MI, TIA, CVA, polio, history of R foot surgery, HOH    Patient Stated Goals get stronger, and get his walking better    Currently in Pain? No/denies            [facility] PT Assessment - 10/20/15 0001    AROM   Left Knee Flexion 117                     [facility] Adult PT Treatment/Exercise - 10/20/15 0001    Knee/Hip Exercises: Standing   Forward Step Up 2 sets;Both;10 reps;Hand Hold: 2;Step Height: 4";Step Height: 6"  x1 set at 4"; x1 set at 6"   Knee/Hip Exercises: Supine   Heel Slides AROM;Left;1 set;15 reps  10 sec hold   Other Supine Knee/Hip  Exercises ankle pumps x20 reps while wearing ice on knee   Manual Therapy   Manual Therapy Joint mobilization;Edema management   Manual therapy comments Separate from all other interventions   Edema Management ice elevation and retrograde massage of LLE   Joint Mobilization Grade III-IV S<>I Lt patellar jt mobs                PT Education - 10/20/15 1226    Education provided Yes   Education Details discussed parameters of ice/elevation to decrease swelling in knee; implications of manual treatment to improve patellar mobility and ROM; encouraged daily walking regimen; encouraged pt to bring in his AFO to next session   Person(s) Educated Patient   Methods Explanation   Comprehension Verbalized understanding;Returned demonstration          PT Short Term Goals - 10/18/15 1223    PT SHORT TERM GOAL #1   Title Patient will demonstrate ROM L knee 0-120 degrees in order to improve gait and overall mobility    Time 3   Period Weeks   Status New   PT SHORT TERM GOAL #2   Title Patient to be able to ambulate with LRAD with correct gait mechanics and minimal unsteadiness to demonstrate improvement of condition    Time 3   Period Weeks   Status New   PT SHORT TERM GOAL #3   Title Patient to be able to tolerate 5-10 minutes of activity without SOB/DOE in order to demonstrate improved strength and functional activity tolerance    Time 3   Period Weeks   Status New   PT SHORT TERM GOAL #4   Title Patient to be correctly and consistently performing appropriate HEP, to be updated PRN    Time 3   Period Weeks   Status New           PT Long Term Goals - 10/18/15 1224    PT LONG TERM GOAL #1   Title Patient to demonstrate strength in all tested muscles 4/5 R LE and 5/5 L LE in order to improve function and gait    Time 6   Period Weeks   Status New   PT LONG TERM GOAL #2   Title Patient to be able to ambulate at least house hold distances with no device and no DOE/SOB in  order to demonstrate improved function and tolerance to activity    Time 6   Period Weeks   Status New   PT LONG TERM GOAL #3   Title Patient to report he has been able to return to gym for  low intensity activities in short intervals with no DOE/SOB to demonstrate improvement in mobilty and function    Time 6   Period Weeks   Status New   PT LONG TERM GOAL #4   Title Patient to be able to reciprocally ascend/descent full flight of stairs with U railing, step over step pattern, good eccentric control, and minimal unsteadiness in order to improve mobility within community    Time 6   Period Weeks   Status New               Plan - 10/20/15 1232    Clinical Impression Statement Today was Mr. Tugwell's 3rd visit in a row since his evaluation. Although not in any significant pain, he does report increased soreness and swelling this session as he has not had time to ice it at home. Session focused on therex and manual techniques to improve Lt knee ROM. He currently demonstrates ~117 deg of knee flexion. Remainder of session addressed swelling with elevation, ice and retrograde massage. Pt reporting improvement by the end of the session.   Rehab Potential Good   Clinical Impairments Affecting Rehab Potential extensive cardiac history with severe cardiopulmonary limitations, history of polio affecting R LE.    PT Frequency 3x / week   PT Duration 6 weeks   PT Treatment/Interventions ADLs/Self Care Home Management;Cryotherapy;Moist Heat;DME Instruction;Gait training;Stair training;Functional mobility training;Therapeutic activities;Therapeutic exercise;Balance training;Neuromuscular re-education;Patient/family education;Manual techniques;Scar mobilization;Passive range of motion;Energy conservation;Taping   PT Next Visit Plan Continue focus on ROM/patellar mobility, balance, gait, edema, progress strength as ROM improves. Begin to progress closed chain strengthening.  Add nustep at EOS to work up  activity tolerance.     PT Home Exercise Plan no updates this visit; encouraged him to bring his AFO to next visit    Consulted and Agree with Plan of Care Patient      Patient will benefit from skilled therapeutic intervention in order to improve the following deficits and impairments:  Abnormal gait, Decreased endurance, Hypomobility, Decreased scar mobility, Decreased activity tolerance, Decreased strength, Pain, Decreased balance, Decreased mobility, Difficulty walking, Decreased range of motion, Improper body mechanics, Decreased coordination, Impaired flexibility, Postural dysfunction  Visit Diagnosis: Stiffness of left knee, not elsewhere classified  Localized edema  Muscle weakness (generalized)  Unsteadiness on feet  Difficulty in walking, not elsewhere classified     Problem List Patient Active Problem List   Diagnosis Date Noted  . Subtherapeutic anticoagulation   . Acute on chronic respiratory failure with hypoxia and hypercapnia (HCC)   . Acute on chronic combined systolic and diastolic CHF (congestive heart failure) ([place])   . LV (left ventricular) mural thrombus ([place]) 09/03/2015  . Systolic and diastolic CHF, acute on chronic ([place])   . Hypokalemia   . CHF (congestive heart failure) ([place]) 09/01/2015  . Respiratory failure with hypoxia ([place]) 08/31/2015  . HCAP (healthcare-associated pneumonia) 08/31/2015  . Acute respiratory failure with hypoxia ([place])   . Encounter for central line placement   . Knee swelling   . Subtherapeutic international normalized ratio (INR) 08/08/2015  . Constipation 08/08/2015  . Hyperlipidemia 08/08/2015  . Blurred vision 07/29/2015  . Anemia 07/29/2015  . S/P total knee replacement 07/27/2015  . S/P CABG x 2 10/08/2014  . Chest pain 10/01/2014  . Elevated INR 10/01/2014  . Leukocytosis 10/01/2014  . Thrombocytopenia ([place]) 10/01/2014  . Chest pain at rest 10/01/2014  . Unstable angina ([place]) 10/01/2014  . Coagulopathy ([place])  10/01/2014  . Paroxysmal atrial fibrillation ([place]) 10/01/2014  .  Coronary artery disease   . Hypertension   . Arthritis   . Peripheral vascular disease ([place])   . Essential hypertension    12:38 PM,10/20/2015 Elly Modena PT, DPT [facility] [address] [place], [place], [address] Phone: [phone]   Fax:  [phone]  Name: ORMOND SCHERER MRN: [id number] Date of Birth: [birth year]

## 2015-10-25 ENCOUNTER — Ambulatory Visit (HOSPITAL_COMMUNITY): Payer: Commercial Managed Care - HMO | Admitting: Physical Therapy

## 2015-10-25 DIAGNOSIS — R600 Localized edema: Secondary | ICD-10-CM

## 2015-10-25 DIAGNOSIS — R262 Difficulty in walking, not elsewhere classified: Secondary | ICD-10-CM

## 2015-10-25 DIAGNOSIS — R2681 Unsteadiness on feet: Secondary | ICD-10-CM | POA: Diagnosis not present

## 2015-10-25 DIAGNOSIS — M6281 Muscle weakness (generalized): Secondary | ICD-10-CM | POA: Diagnosis not present

## 2015-10-25 DIAGNOSIS — M25662 Stiffness of left knee, not elsewhere classified: Secondary | ICD-10-CM

## 2015-10-25 DIAGNOSIS — M1712 Unilateral primary osteoarthritis, left knee: Secondary | ICD-10-CM | POA: Diagnosis not present

## 2015-10-25 NOTE — Therapy (Signed)
[place], [place], [address] Phone: [phone]   Fax:  [phone]  Physical Therapy Treatment  Patient Details  Name: Benjamin Cordova MRN: [id number] Date of Birth: [birth year] Referring Provider: Kathryne Hitch   Encounter Date: 10/25/2015      PT End of Session - 10/25/15 1044    Visit Number 4   Number of Visits 18   Date for PT Re-Evaluation 11/08/15   Authorization Type [employer] Medicare    Authorization Time Period 10/18/15 to 11/29/15   Authorization - Visit Number 4   Authorization - Number of Visits 10   PT Start Time 0950   PT Stop Time 1030   PT Time Calculation (min) 40 min   Activity Tolerance Patient tolerated treatment well   Behavior During Therapy [facility] for tasks assessed/performed      Past Medical History:  Diagnosis Date  . Arthritis   . Coronary artery disease    Multivessel s/p CABG [facility] at [facility]  . Essential hypertension   . Hematuria   . High cholesterol   . History of blood transfusion 07/2015   Knee replacement HgB <9  . History of pneumonia   . History of stroke   . Myocardial infarction ([place]) 1980  . Peripheral vascular disease ([place])   . Polio osteopathy of lower leg ([place]) Age 21   Affected right leg  . Skin cancer   . TIA (transient ischemic attack) 1989   Chronic coumadin  . UTI (lower urinary tract infection)     Past Surgical History:  Procedure Laterality Date  . CARDIAC CATHETERIZATION N/A 10/05/2014   Procedure: Left Heart Cath and Coronary Angiography;  Surgeon: Peter M Martinique, MD;  Location: [place] [facility];  Service: Cardiovascular;  Laterality: N/A;  . CORONARY ARTERY BYPASS GRAFT  1981   [facility]  . CORONARY ARTERY BYPASS GRAFT N/A 10/08/2014   Procedure: REDO CORONARY ARTERY BYPASS GRAFTING (CABG) Times Two Grafts using Left Internal Mammary and Left Radial Artery;  Surgeon: Melrose Nakayama, MD;  Location: [place];  Service: Open Heart  Surgery;  Laterality: N/A;  . CYSTOSCOPY WITH INJECTION  10/10/2010   Procedure: CYSTOSCOPY WITH INJECTION;  Surgeon: Marissa Nestle;  [facility];  Service: Urology;  Laterality: N/A;  with retrograde urethrogram  . FOOT SURGERY Right 1954   [facility], muscle implantation  . JOINT REPLACEMENT    . RADIAL ARTERY HARVEST Left 10/08/2014   Procedure: Left RADIAL ARTERY HARVEST;  Surgeon: Melrose Nakayama, MD;  Location: [place];  Service: Open Heart Surgery;  Laterality: Left;  . TEE WITHOUT CARDIOVERSION N/A 10/08/2014   Procedure: TRANSESOPHAGEAL ECHOCARDIOGRAM (TEE);  Surgeon: Melrose Nakayama, MD;  Location: [place];  Service: Open Heart Surgery;  Laterality: N/A;  . TONSILLECTOMY  1949   [facility]  . TOTAL KNEE ARTHROPLASTY Left 07/27/2015   Procedure: TOTAL LEFT KNEE ARTHROPLASTY;  Surgeon: [name], MD;  Location: [place];  Service: Orthopedics;  Laterality: Left;    There were no vitals filed for this visit.      Subjective Assessment - 10/25/15 0954    Subjective Pt reports that he has had a busy morning taking his wife back and forth from the hospital. He was able to do his exercises over the weekend. He forgot his AFO in his car which is in the shop, but he will bring it next time.    Pertinent History chronic warfarin use,  on beta blockers, extensive cardiac history including CAD/PVD/multiple CABG procedures/HTN/etc, hx respiratory failure, OA, coagulopathy, MI, TIA, CVA, polio, history of R foot surgery, HOH    Patient Stated Goals get stronger, and get his walking better    Currently in Pain? No/denies            [facility] PT Assessment - 10/25/15 0001      AROM   Left Knee Flexion 118                     [facility] Adult PT Treatment/Exercise - 10/25/15 0001      Knee/Hip Exercises: Standing   Functional Squat 1 set;15 reps   Functional Squat Limitations heavy use of UE for momenturm     Knee/Hip Exercises: Seated   Long Arc Quad Left;Right;1 set;15 reps;2  sets   [employer] Weight 5 lbs.   Long [employer] Limitations blue TB with LLE, 5# with RLE     Knee/Hip Exercises: Supine   Heel Slides Left;1 set;15 reps  x10 sec hold   Bridges Both;2 sets;15 reps   Bridges Limitations straight leg bridge x20 reps decreased to x15 reps for second set       Manual Therapy   Manual Therapy Edema management   Manual therapy comments Separate from all other interventions   Edema Management retrograde massage of LLE                PT Education - 10/25/15 1043    Education provided Yes   Education Details encouraged pt to bring AFO next visit; discussed HEP adherence   Person(s) Educated Patient   Methods Explanation   Comprehension Verbalized understanding          PT Short Term Goals - 10/18/15 1223      PT SHORT TERM GOAL #1   Title Patient will demonstrate ROM L knee 0-120 degrees in order to improve gait and overall mobility    Time 3   Period Weeks   Status New     PT SHORT TERM GOAL #2   Title Patient to be able to ambulate with LRAD with correct gait mechanics and minimal unsteadiness to demonstrate improvement of condition    Time 3   Period Weeks   Status New     PT SHORT TERM GOAL #3   Title Patient to be able to tolerate 5-10 minutes of activity without SOB/DOE in order to demonstrate improved strength and functional activity tolerance    Time 3   Period Weeks   Status New     PT SHORT TERM GOAL #4   Title Patient to be correctly and consistently performing appropriate HEP, to be updated PRN    Time 3   Period Weeks   Status New           PT Long Term Goals - 10/18/15 1224      PT LONG TERM GOAL #1   Title Patient to demonstrate strength in all tested muscles 4/5 R LE and 5/5 L LE in order to improve function and gait    Time 6   Period Weeks   Status New     PT LONG TERM GOAL #2   Title Patient to be able to ambulate at least house hold distances with no device and no DOE/SOB in order to  demonstrate improved function and tolerance to activity    Time 6   Period Weeks   Status New  PT LONG TERM GOAL #3   Title Patient to report he has been able to return to gym for low intensity activities in short intervals with no DOE/SOB to demonstrate improvement in mobilty and function    Time 6   Period Weeks   Status New     PT LONG TERM GOAL #4   Title Patient to be able to reciprocally ascend/descent full flight of stairs with U railing, step over step pattern, good eccentric control, and minimal unsteadiness in order to improve mobility within community    Time 6   Period Weeks   Status New               Plan - 10/25/15 1044    Clinical Impression Statement Today's session focused on LE strengthening exercise. Pt was able to perform all increased reps and resistance without report of pain, however he did report muscle fatigue by the end of the session. Encouraged him to continue working on swelling management techniques discussed in previous sessions   Rehab Potential Good   Clinical Impairments Affecting Rehab Potential extensive cardiac history with severe cardiopulmonary limitations, history of polio affecting R LE.    PT Frequency 3x / week   PT Duration 6 weeks   PT Treatment/Interventions ADLs/Self Care Home Management;Cryotherapy;Moist Heat;DME Instruction;Gait training;Stair training;Functional mobility training;Therapeutic activities;Therapeutic exercise;Balance training;Neuromuscular re-education;Patient/family education;Manual techniques;Scar mobilization;Passive range of motion;Energy conservation;Taping   PT Next Visit Plan progression of BLE strength balance, gait, edema; Begin to progress closed chain strengthening.  Add nustep at EOS to work up activity tolerance.     PT Home Exercise Plan no updates this visit; encouraged him to bring his AFO to next visit    Consulted and Agree with Plan of Care Patient      Patient will benefit from skilled  therapeutic intervention in order to improve the following deficits and impairments:  Abnormal gait, Decreased endurance, Hypomobility, Decreased scar mobility, Decreased activity tolerance, Decreased strength, Pain, Decreased balance, Decreased mobility, Difficulty walking, Decreased range of motion, Improper body mechanics, Decreased coordination, Impaired flexibility, Postural dysfunction  Visit Diagnosis: Stiffness of left knee, not elsewhere classified  Localized edema  Muscle weakness (generalized)  Unsteadiness on feet  Difficulty in walking, not elsewhere classified     Problem List Patient Active Problem List   Diagnosis Date Noted  . Subtherapeutic anticoagulation   . Acute on chronic respiratory failure with hypoxia and hypercapnia (HCC)   . Acute on chronic combined systolic and diastolic CHF (congestive heart failure) ([place])   . LV (left ventricular) mural thrombus ([place]) 09/03/2015  . Systolic and diastolic CHF, acute on chronic ([place])   . Hypokalemia   . CHF (congestive heart failure) ([place]) 09/01/2015  . Respiratory failure with hypoxia ([place]) 08/31/2015  . HCAP (healthcare-associated pneumonia) 08/31/2015  . Acute respiratory failure with hypoxia ([place])   . Encounter for central line placement   . Knee swelling   . Subtherapeutic international normalized ratio (INR) 08/08/2015  . Constipation 08/08/2015  . Hyperlipidemia 08/08/2015  . Blurred vision 07/29/2015  . Anemia 07/29/2015  . S/P total knee replacement 07/27/2015  . S/P CABG x 2 10/08/2014  . Chest pain 10/01/2014  . Elevated INR 10/01/2014  . Leukocytosis 10/01/2014  . Thrombocytopenia ([place]) 10/01/2014  . Chest pain at rest 10/01/2014  . Unstable angina ([place]) 10/01/2014  . Coagulopathy ([place]) 10/01/2014  . Paroxysmal atrial fibrillation ([place]) 10/01/2014  . Coronary artery disease   . Hypertension   . Arthritis   . Peripheral  vascular disease ([place])   . Essential hypertension     10:51  AM,10/25/15 Elly Modena PT, DPT [facility] [place] [address] [place], [place], [address] Phone: [phone]   Fax:  [phone]  Name: Benjamin Cordova MRN: [id number] Date of Birth: 07/26/1934

## 2015-10-28 ENCOUNTER — Ambulatory Visit (HOSPITAL_COMMUNITY): Payer: Commercial Managed Care - HMO | Admitting: Physical Therapy

## 2015-10-28 DIAGNOSIS — R262 Difficulty in walking, not elsewhere classified: Secondary | ICD-10-CM

## 2015-10-28 DIAGNOSIS — M25662 Stiffness of left knee, not elsewhere classified: Secondary | ICD-10-CM

## 2015-10-28 DIAGNOSIS — M6281 Muscle weakness (generalized): Secondary | ICD-10-CM

## 2015-10-28 DIAGNOSIS — R600 Localized edema: Secondary | ICD-10-CM | POA: Diagnosis not present

## 2015-10-28 DIAGNOSIS — I639 Cerebral infarction, unspecified: Secondary | ICD-10-CM | POA: Diagnosis not present

## 2015-10-28 DIAGNOSIS — I482 Chronic atrial fibrillation: Secondary | ICD-10-CM | POA: Diagnosis not present

## 2015-10-28 DIAGNOSIS — R2681 Unsteadiness on feet: Secondary | ICD-10-CM

## 2015-10-28 NOTE — Therapy (Signed)
[place], [place], [address] Phone: [phone]   Fax:  [phone]  Physical Therapy Treatment  Patient Details  Name: Benjamin Cordova MRN: [id number] Date of Birth: 06-14-1934 Referring Provider: Kathryne Hitch   Encounter Date: 10/28/2015      PT End of Session - 10/28/15 1034    Visit Number 5   Number of Visits 18   Date for PT Re-Evaluation 11/08/15   Authorization Type [employer] Medicare    Authorization Time Period 10/18/15 to 11/29/15   Authorization - Visit Number 5   Authorization - Number of Visits 10   PT Start Time 0950   PT Stop Time 1030   PT Time Calculation (min) 40 min   Equipment Utilized During Treatment Gait belt   Activity Tolerance Patient tolerated treatment well   Behavior During Therapy [facility] for tasks assessed/performed      Past Medical History:  Diagnosis Date  . Arthritis   . Coronary artery disease    Multivessel s/p CABG 1981 at [facility]  . Essential hypertension   . Hematuria   . High cholesterol   . History of blood transfusion 07/2015   Knee replacement HgB <9  . History of pneumonia   . History of stroke   . Myocardial infarction ([place]) 1980  . Peripheral vascular disease ([place])   . Polio osteopathy of lower leg ([place]) Age 18   Affected right leg  . Skin cancer   . TIA (transient ischemic attack) 1989   Chronic coumadin  . UTI (lower urinary tract infection)     Past Surgical History:  Procedure Laterality Date  . CARDIAC CATHETERIZATION N/A 10/05/2014   Procedure: Left Heart Cath and Coronary Angiography;  Surgeon: Peter M Martinique, MD;  Location: [place] [facility];  Service: Cardiovascular;  Laterality: N/A;  . CORONARY ARTERY BYPASS GRAFT  1981   [facility]  . CORONARY ARTERY BYPASS GRAFT N/A 10/08/2014   Procedure: REDO CORONARY ARTERY BYPASS GRAFTING (CABG) Times Two Grafts using Left Internal Mammary and Left Radial Artery;  Surgeon: Melrose Nakayama,  MD;  Location: [place];  Service: Open Heart Surgery;  Laterality: N/A;  . CYSTOSCOPY WITH INJECTION  10/10/2010   Procedure: CYSTOSCOPY WITH INJECTION;  Surgeon: Marissa Nestle;  [facility];  Service: Urology;  Laterality: N/A;  with retrograde urethrogram  . FOOT SURGERY Right 1954   [facility], muscle implantation  . JOINT REPLACEMENT    . RADIAL ARTERY HARVEST Left 10/08/2014   Procedure: Left RADIAL ARTERY HARVEST;  Surgeon: Melrose Nakayama, MD;  Location: [place];  Service: Open Heart Surgery;  Laterality: Left;  . TEE WITHOUT CARDIOVERSION N/A 10/08/2014   Procedure: TRANSESOPHAGEAL ECHOCARDIOGRAM (TEE);  Surgeon: Melrose Nakayama, MD;  Location: [place];  Service: Open Heart Surgery;  Laterality: N/A;  . TONSILLECTOMY  1949   [facility]  . TOTAL KNEE ARTHROPLASTY Left 07/27/2015   Procedure: TOTAL LEFT KNEE ARTHROPLASTY;  Surgeon: [name], MD;  Location: [place];  Service: Orthopedics;  Laterality: Left;    There were no vitals filed for this visit.      Subjective Assessment - 10/28/15 0951    Subjective Pt reports that his knee is a little "touchy" this morning. He got a compression garment but needs help puting it back on.   Pertinent History chronic warfarin use, on beta blockers, extensive cardiac history including CAD/PVD/multiple CABG procedures/HTN/etc, hx respiratory failure, OA, coagulopathy, MI, TIA,  CVA, polio, history of R foot surgery, HOH    Patient Stated Goals get stronger, and get his walking better    Currently in Pain? No/denies                         [facility] Adult PT Treatment/Exercise - 10/28/15 0001      Knee/Hip Exercises: Standing   Forward Step Up 2 sets;15 reps;Hand Hold: 1;Step Height: 6";Left   Forward Step Up Limitations Rt 2x15, 4" step      Knee/Hip Exercises: Seated   Sit to Sand 1 set;15 reps  encouraged RUE support only; LLE forwrad to decrease use      Knee/Hip Exercises: Supine   Bridges Limitations Straight leg bridge  x20; 6" box with foam pad             Balance Exercises - 10/28/15 1030      Balance Exercises: Standing   Tandem Stance Eyes open;2 reps;20 secs;Intermittent upper extremity support  RLE: 10 sec max, LLE: 2-3 sec max           PT Education - 10/28/15 1029    Education Details compression garment donning; continued HEP adherence   Person(s) Educated Patient   Methods Explanation;Demonstration   Comprehension Verbalized understanding          PT Short Term Goals - 10/18/15 1223      PT SHORT TERM GOAL #1   Title Patient will demonstrate ROM L knee 0-120 degrees in order to improve gait and overall mobility    Time 3   Period Weeks   Status New     PT SHORT TERM GOAL #2   Title Patient to be able to ambulate with LRAD with correct gait mechanics and minimal unsteadiness to demonstrate improvement of condition    Time 3   Period Weeks   Status New     PT SHORT TERM GOAL #3   Title Patient to be able to tolerate 5-10 minutes of activity without SOB/DOE in order to demonstrate improved strength and functional activity tolerance    Time 3   Period Weeks   Status New     PT SHORT TERM GOAL #4   Title Patient to be correctly and consistently performing appropriate HEP, to be updated PRN    Time 3   Period Weeks   Status New           PT Long Term Goals - 10/18/15 1224      PT LONG TERM GOAL #1   Title Patient to demonstrate strength in all tested muscles 4/5 R LE and 5/5 L LE in order to improve function and gait    Time 6   Period Weeks   Status New     PT LONG TERM GOAL #2   Title Patient to be able to ambulate at least house hold distances with no device and no DOE/SOB in order to demonstrate improved function and tolerance to activity    Time 6   Period Weeks   Status New     PT LONG TERM GOAL #3   Title Patient to report he has been able to return to gym for low intensity activities in short intervals with no DOE/SOB to demonstrate improvement  in mobilty and function    Time 6   Period Weeks   Status New     PT LONG TERM GOAL #4   Title Patient to be able to reciprocally ascend/descent  full flight of stairs with U railing, step over step pattern, good eccentric control, and minimal unsteadiness in order to improve mobility within community    Time 6   Period Weeks   Status New               Plan - 10/28/15 1041    Clinical Impression Statement Continued focus on LE functional strength. Pt demonstrates ability to perform all increased reps/resistance with activity without report of pain. Ended session demonstrating techniques to don compression garment. Pt verbalizing understanding.   Rehab Potential Good   Clinical Impairments Affecting Rehab Potential extensive cardiac history with severe cardiopulmonary limitations, history of polio affecting R LE.    PT Frequency 3x / week   PT Duration 6 weeks   PT Treatment/Interventions ADLs/Self Care Home Management;Cryotherapy;Moist Heat;DME Instruction;Gait training;Stair training;Functional mobility training;Therapeutic activities;Therapeutic exercise;Balance training;Neuromuscular re-education;Patient/family education;Manual techniques;Scar mobilization;Passive range of motion;Energy conservation;Taping   PT Next Visit Plan progression of BLE strength balance, gait, edema; Begin to progress closed chain strengthening.  Add nustep at EOS to work up activity tolerance.     PT Home Exercise Plan no updates this visit; encouraged him to bring his AFO to next visit    Consulted and Agree with Plan of Care Patient      Patient will benefit from skilled therapeutic intervention in order to improve the following deficits and impairments:  Abnormal gait, Decreased endurance, Hypomobility, Decreased scar mobility, Decreased activity tolerance, Decreased strength, Pain, Decreased balance, Decreased mobility, Difficulty walking, Decreased range of motion, Improper body mechanics, Decreased  coordination, Impaired flexibility, Postural dysfunction  Visit Diagnosis: Stiffness of left knee, not elsewhere classified  Localized edema  Muscle weakness (generalized)  Unsteadiness on feet  Difficulty in walking, not elsewhere classified     Problem List Patient Active Problem List   Diagnosis Date Noted  . Subtherapeutic anticoagulation   . Acute on chronic respiratory failure with hypoxia and hypercapnia (HCC)   . Acute on chronic combined systolic and diastolic CHF (congestive heart failure) ([place])   . LV (left ventricular) mural thrombus ([place]) 09/03/2015  . Systolic and diastolic CHF, acute on chronic ([place])   . Hypokalemia   . CHF (congestive heart failure) ([place]) 09/01/2015  . Respiratory failure with hypoxia ([place]) 08/31/2015  . HCAP (healthcare-associated pneumonia) 08/31/2015  . Acute respiratory failure with hypoxia ([place])   . Encounter for central line placement   . Knee swelling   . Subtherapeutic international normalized ratio (INR) 08/08/2015  . Constipation 08/08/2015  . Hyperlipidemia 08/08/2015  . Blurred vision 07/29/2015  . Anemia 07/29/2015  . S/P total knee replacement 07/27/2015  . S/P CABG x 2 10/08/2014  . Chest pain 10/01/2014  . Elevated INR 10/01/2014  . Leukocytosis 10/01/2014  . Thrombocytopenia ([place]) 10/01/2014  . Chest pain at rest 10/01/2014  . Unstable angina ([place]) 10/01/2014  . Coagulopathy ([place]) 10/01/2014  . Paroxysmal atrial fibrillation ([place]) 10/01/2014  . Coronary artery disease   . Hypertension   . Arthritis   . Peripheral vascular disease ([place])   . Essential hypertension    10:50 AM,10/28/15 Elly Modena PT, DPT [facility] [place] [address] [place], [place], [address] Phone: [phone]   Fax:  [phone]  Name: Benjamin Cordova MRN: [id number] Date of Birth: 11-25-1934

## 2015-10-31 ENCOUNTER — Ambulatory Visit (HOSPITAL_COMMUNITY): Payer: Commercial Managed Care - HMO | Admitting: Physical Therapy

## 2015-10-31 DIAGNOSIS — R600 Localized edema: Secondary | ICD-10-CM

## 2015-10-31 DIAGNOSIS — M6281 Muscle weakness (generalized): Secondary | ICD-10-CM

## 2015-10-31 DIAGNOSIS — M25662 Stiffness of left knee, not elsewhere classified: Secondary | ICD-10-CM | POA: Diagnosis not present

## 2015-10-31 DIAGNOSIS — R262 Difficulty in walking, not elsewhere classified: Secondary | ICD-10-CM

## 2015-10-31 DIAGNOSIS — R2681 Unsteadiness on feet: Secondary | ICD-10-CM | POA: Diagnosis not present

## 2015-10-31 NOTE — Therapy (Signed)
[place], [place], [address] Phone: [phone]   Fax:  [phone]  Physical Therapy Treatment  Patient Details  Name: Benjamin Cordova MRN: [id number] Date of Birth: 10/13/1934 Referring Provider: Kathryne Hitch   Encounter Date: 10/31/2015      PT End of Session - 10/31/15 0938    Visit Number 6   Number of Visits 18   Date for PT Re-Evaluation 11/08/15   Authorization Type [employer]    Authorization Time Period 10/18/15 to 11/29/15   Authorization - Visit Number 6   Authorization - Number of Visits 10   PT Start Time 0902   PT Stop Time 0945   PT Time Calculation (min) 43 min   Equipment Utilized During Treatment Gait belt   Activity Tolerance Patient tolerated treatment well;Patient limited by fatigue   Behavior During Therapy [name] for tasks assessed/performed      Past Medical History:  Diagnosis Date  . Arthritis   . Coronary artery disease    Multivessel s/p CABG [facility] at [facility]  . Essential hypertension   . Hematuria   . High cholesterol   . History of blood transfusion 07/2015   Knee replacement HgB <9  . History of pneumonia   . History of stroke   . Myocardial infarction ([place]) 1980  . Peripheral vascular disease ([place])   . Polio osteopathy of lower leg ([place]) Age 81   Affected right leg  . Skin cancer   . TIA (transient ischemic attack) 1989   Chronic coumadin  . UTI (lower urinary tract infection)     Past Surgical History:  Procedure Laterality Date  . CARDIAC CATHETERIZATION N/A 10/05/2014   Procedure: Left Heart Cath and Coronary Angiography;  Surgeon: Peter M Martinique, MD;  Location: [place] [facility];  Service: Cardiovascular;  Laterality: N/A;  . CORONARY ARTERY BYPASS GRAFT  1981   [facility]  . CORONARY ARTERY BYPASS GRAFT N/A 10/08/2014   Procedure: REDO CORONARY ARTERY BYPASS GRAFTING (CABG) Times Two Grafts using Left Internal Mammary and Left Radial Artery;   Surgeon: Melrose Nakayama, MD;  Location: [facility];  Service: Open Heart Surgery;  Laterality: N/A;  . CYSTOSCOPY WITH INJECTION  10/10/2010   Procedure: CYSTOSCOPY WITH INJECTION;  Surgeon: Marissa Nestle;  Location: [facility];  Service: Urology;  Laterality: N/A;  with retrograde urethrogram  . FOOT SURGERY Right 1954   [facility], muscle implantation  . JOINT REPLACEMENT    . RADIAL ARTERY HARVEST Left 10/08/2014   Procedure: Left RADIAL ARTERY HARVEST;  Surgeon: Melrose Nakayama, MD;  Location: [place];  Service: Open Heart Surgery;  Laterality: Left;  . TEE WITHOUT CARDIOVERSION N/A 10/08/2014   Procedure: TRANSESOPHAGEAL ECHOCARDIOGRAM (TEE);  Surgeon: Melrose Nakayama, MD;  Location: [facility];  Service: Open Heart Surgery;  Laterality: N/A;  . TONSILLECTOMY  1949   [facility]  . TOTAL KNEE ARTHROPLASTY Left 07/27/2015   Procedure: TOTAL LEFT KNEE ARTHROPLASTY;  Surgeon: [name], MD;  Location: [facility];  Service: Orthopedics;  Laterality: Left;    There were no vitals filed for this visit.      Subjective Assessment - 10/31/15 0915    Subjective Pt states he has no pain, "just tired and worn out".  A little pain when he first gets up and not wearing his compression garment since Saturday as he is no longer swelling.   Currently in Pain? No/denies  [facility] Adult PT Treatment/Exercise - 10/31/15 0001      Knee/Hip Exercises: Aerobic   Nustep 8 minutes level 2 hills 1 LE only seat 9     Knee/Hip Exercises: Standing   Lateral Step Up Both;10 reps;Step Height: 4";Hand Hold: 2   Lateral Step Up Limitations both 4"   Forward Step Up Both;10 reps;Right;Step Height: 4";Hand Hold: 1;Left;Step Height: 6"   Forward Step Up Limitations Rt: 4", Lt: 6"   Functional Squat 1 set;15 reps   Stairs 4" reciprocally 1RT with  1 HR   Gait Training 155 feet with single axillary crutch, 50' without AD     Knee/Hip Exercises: Seated   Sit to Sand 1 set;15 reps                   PT Short Term Goals - 10/18/15 1223      PT SHORT TERM GOAL #1   Title Patient will demonstrate ROM L knee 0-120 degrees in order to improve gait and overall mobility    Time 3   Period Weeks   Status New     PT SHORT TERM GOAL #2   Title Patient to be able to ambulate with LRAD with correct gait mechanics and minimal unsteadiness to demonstrate improvement of condition    Time 3   Period Weeks   Status New     PT SHORT TERM GOAL #3   Title Patient to be able to tolerate 5-10 minutes of activity without SOB/DOE in order to demonstrate improved strength and functional activity tolerance    Time 3   Period Weeks   Status New     PT SHORT TERM GOAL #4   Title Patient to be correctly and consistently performing appropriate HEP, to be updated PRN    Time 3   Period Weeks   Status New           PT Long Term Goals - 10/18/15 1224      PT LONG TERM GOAL #1   Title Patient to demonstrate strength in all tested muscles 4/5 R LE and 5/5 L LE in order to improve function and gait    Time 6   Period Weeks   Status New     PT LONG TERM GOAL #2   Title Patient to be able to ambulate at least house hold distances with no device and no DOE/SOB in order to demonstrate improved function and tolerance to activity    Time 6   Period Weeks   Status New     PT LONG TERM GOAL #3   Title Patient to report he has been able to return to gym for low intensity activities in short intervals with no DOE/SOB to demonstrate improvement in mobilty and function    Time 6   Period Weeks   Status New     PT LONG TERM GOAL #4   Title Patient to be able to reciprocally ascend/descent full flight of stairs with U railing, step over step pattern, good eccentric control, and minimal unsteadiness in order to improve mobility within community    Time 6   Period Weeks   Status New               Plan - 10/31/15 0940    Clinical Impression Statement Continued to  progress functional activity tolerance and increasing strength.  Pt only required 2 seated rest breaks today.   Added lateral step ups to POC and worked on ambulation  without AD.  AFO is in his car thats at the shop and will bring when he gets it back.  Ended session with addition of nustep to work on activity tolerance and improving slow twitch muscular strength.   Able to negotiate 1 flight of steps 4" reciprocally with 1 HR.     Rehab Potential Good   Clinical Impairments Affecting Rehab Potential extensive cardiac history with severe cardiopulmonary limitations, history of polio affecting R LE.    PT Frequency 3x / week   PT Duration 6 weeks   PT Treatment/Interventions ADLs/Self Care Home Management;Cryotherapy;Moist Heat;DME Instruction;Gait training;Stair training;Functional mobility training;Therapeutic activities;Therapeutic exercise;Balance training;Neuromuscular re-education;Patient/family education;Manual techniques;Scar mobilization;Passive range of motion;Energy conservation;Taping   PT Next Visit Plan progression of closed chain strengthening. Gait training with AFO when patient brings to session . continue to improve activity tolerance.     PT Home Exercise Plan no updates this visit; encouraged him to bring his AFO to next visit    Consulted and Agree with Plan of Care Patient      Patient will benefit from skilled therapeutic intervention in order to improve the following deficits and impairments:  Abnormal gait, Decreased endurance, Hypomobility, Decreased scar mobility, Decreased activity tolerance, Decreased strength, Pain, Decreased balance, Decreased mobility, Difficulty walking, Decreased range of motion, Improper body mechanics, Decreased coordination, Impaired flexibility, Postural dysfunction, Decreased knowledge of precautions  Visit Diagnosis: No diagnosis found.     Problem List Patient Active Problem List   Diagnosis Date Noted  . Subtherapeutic anticoagulation    . Acute on chronic respiratory failure with hypoxia and hypercapnia (HCC)   . Acute on chronic combined systolic and diastolic CHF (congestive heart failure) ([place])   . LV (left ventricular) mural thrombus ([place]) 09/03/2015  . Systolic and diastolic CHF, acute on chronic ([place])   . Hypokalemia   . CHF (congestive heart failure) ([place]) 09/01/2015  . Respiratory failure with hypoxia ([place]) 08/31/2015  . HCAP (healthcare-associated pneumonia) 08/31/2015  . Acute respiratory failure with hypoxia ([place])   . Encounter for central line placement   . Knee swelling   . Subtherapeutic international normalized ratio (INR) 08/08/2015  . Constipation 08/08/2015  . Hyperlipidemia 08/08/2015  . Blurred vision 07/29/2015  . Anemia 07/29/2015  . S/P total knee replacement 07/27/2015  . S/P CABG x 2 10/08/2014  . Chest pain 10/01/2014  . Elevated INR 10/01/2014  . Leukocytosis 10/01/2014  . Thrombocytopenia ([place]) 10/01/2014  . Chest pain at rest 10/01/2014  . Unstable angina ([place]) 10/01/2014  . Coagulopathy ([place]) 10/01/2014  . Paroxysmal atrial fibrillation ([place]) 10/01/2014  . Coronary artery disease   . Hypertension   . Arthritis   . Peripheral vascular disease ([place])   . Essential hypertension     Teena Irani 10/31/2015, 9:50 AM  [facility] [address] [place], [place], [address] Phone: [phone]   Fax:  [phone]  Name: Benjamin Cordova MRN: [id number] Date of Birth: 1934/11/10

## 2015-10-31 NOTE — Therapy (Signed)
[place], [place], [address] Phone: [phone]   Fax:  [phone]  Physical Therapy Treatment  Patient Details  Name: Benjamin Cordova MRN: [id number] Date of Birth: June 25, 1934 Referring Provider: Kathryne Hitch   Encounter Date: 10/31/2015      PT End of Session - 10/31/15 0953    Visit Number 5   Number of Visits 18   Date for PT Re-Evaluation 11/08/15   Authorization Type [employer] Medicare    Authorization Time Period 10/18/15 to 11/29/15   Authorization - Visit Number 5   Authorization - Number of Visits 10   PT Start Time 0902   PT Stop Time 0950   PT Time Calculation (min) 48 min   Equipment Utilized During Treatment Gait belt   Activity Tolerance Patient tolerated treatment well   Behavior During Therapy [facility] for tasks assessed/performed      Past Medical History:  Diagnosis Date  . Arthritis   . Coronary artery disease    Multivessel s/p CABG 1981 at [facility]  . Essential hypertension   . Hematuria   . High cholesterol   . History of blood transfusion 07/2015   Knee replacement HgB <9  . History of pneumonia   . History of stroke   . Myocardial infarction ([place]) 1980  . Peripheral vascular disease ([place])   . Polio osteopathy of lower leg ([place]) Age 38   Affected right leg  . Skin cancer   . TIA (transient ischemic attack) 1989   Chronic coumadin  . UTI (lower urinary tract infection)     Past Surgical History:  Procedure Laterality Date  . CARDIAC CATHETERIZATION N/A 10/05/2014   Procedure: Left Heart Cath and Coronary Angiography;  Surgeon: Peter M Martinique, MD;  Location: [place] [facility];  Service: Cardiovascular;  Laterality: N/A;  . CORONARY ARTERY BYPASS GRAFT  1981   [facility]  . CORONARY ARTERY BYPASS GRAFT N/A 10/08/2014   Procedure: REDO CORONARY ARTERY BYPASS GRAFTING (CABG) Times Two Grafts using Left Internal Mammary and Left Radial Artery;  Surgeon: Melrose Nakayama,  MD;  Location: [facility];  Service: Open Heart Surgery;  Laterality: N/A;  . CYSTOSCOPY WITH INJECTION  10/10/2010   Procedure: CYSTOSCOPY WITH INJECTION;  Surgeon: Marissa Nestle;  [facility];  Service: Urology;  Laterality: N/A;  with retrograde urethrogram  . FOOT SURGERY Right 1954   [facility], muscle implantation  . JOINT REPLACEMENT    . RADIAL ARTERY HARVEST Left 10/08/2014   Procedure: Left RADIAL ARTERY HARVEST;  Surgeon: Melrose Nakayama, MD;  Location: [facility];  Service: Open Heart Surgery;  Laterality: Left;  . TEE WITHOUT CARDIOVERSION N/A 10/08/2014   Procedure: TRANSESOPHAGEAL ECHOCARDIOGRAM (TEE);  Surgeon: Melrose Nakayama, MD;  Location: [facility];  Service: Open Heart Surgery;  Laterality: N/A;  . TONSILLECTOMY  1949   [facility]  . TOTAL KNEE ARTHROPLASTY Left 07/27/2015   Procedure: TOTAL LEFT KNEE ARTHROPLASTY;  Surgeon: [name], MD;  Location: [facility];  Service: Orthopedics;  Laterality: Left;    There were no vitals filed for this visit.      Subjective Assessment - 10/31/15 0915    Subjective Pt states he has no pain, "just tired and worn out".  A little pain when he first gets up and not wearing his compression garment since Saturday as he is no longer swelling.   Currently in Pain? No/denies  [facility] Adult PT Treatment/Exercise - 10/31/15 0001      Knee/Hip Exercises: Aerobic   Nustep 8 minutes level 2 hills 1 LE only seat 9     Knee/Hip Exercises: Standing   Lateral Step Up Both;10 reps;Step Height: 4";Hand Hold: 2   Lateral Step Up Limitations both 4"   Forward Step Up Both;10 reps;Right;Step Height: 4";Hand Hold: 1;Left;Step Height: 6"   Forward Step Up Limitations Rt: 4", Lt: 6"   Functional Squat 1 set;15 reps   Stairs 4" reciprocally 1RT with  1 HR   Gait Training 155 feet with single axillary crutch, 50' without AD     Knee/Hip Exercises: Seated   Sit to Sand 1 set;15 reps                  PT  Short Term Goals - 10/18/15 1223      PT SHORT TERM GOAL #1   Title Patient will demonstrate ROM L knee 0-120 degrees in order to improve gait and overall mobility    Time 3   Period Weeks   Status New     PT SHORT TERM GOAL #2   Title Patient to be able to ambulate with LRAD with correct gait mechanics and minimal unsteadiness to demonstrate improvement of condition    Time 3   Period Weeks   Status New     PT SHORT TERM GOAL #3   Title Patient to be able to tolerate 5-10 minutes of activity without SOB/DOE in order to demonstrate improved strength and functional activity tolerance    Time 3   Period Weeks   Status New     PT SHORT TERM GOAL #4   Title Patient to be correctly and consistently performing appropriate HEP, to be updated PRN    Time 3   Period Weeks   Status New           PT Long Term Goals - 10/18/15 1224      PT LONG TERM GOAL #1   Title Patient to demonstrate strength in all tested muscles 4/5 R LE and 5/5 L LE in order to improve function and gait    Time 6   Period Weeks   Status New     PT LONG TERM GOAL #2   Title Patient to be able to ambulate at least house hold distances with no device and no DOE/SOB in order to demonstrate improved function and tolerance to activity    Time 6   Period Weeks   Status New     PT LONG TERM GOAL #3   Title Patient to report he has been able to return to gym for low intensity activities in short intervals with no DOE/SOB to demonstrate improvement in mobilty and function    Time 6   Period Weeks   Status New     PT LONG TERM GOAL #4   Title Patient to be able to reciprocally ascend/descent full flight of stairs with U railing, step over step pattern, good eccentric control, and minimal unsteadiness in order to improve mobility within community    Time 6   Period Weeks   Status New               Plan - 10/31/15 0940    Clinical Impression Statement Continued to progress functional activity  tolerance and increasing strength.  Pt only required 2 seated rest breaks today.   Added lateral step ups to POC and worked on ambulation  without AD.  AFO is in his car thats at the shop and will bring when he gets it back.  Ended session with addition of nustep to work on activity tolerance and improving slow twitch muscular strength.   Able to negotiate 1 flight of steps 4" reciprocally with 1 HR.     Rehab Potential Good   Clinical Impairments Affecting Rehab Potential extensive cardiac history with severe cardiopulmonary limitations, history of polio affecting R LE.    PT Frequency 3x / week   PT Duration 6 weeks   PT Treatment/Interventions ADLs/Self Care Home Management;Cryotherapy;Moist Heat;DME Instruction;Gait training;Stair training;Functional mobility training;Therapeutic activities;Therapeutic exercise;Balance training;Neuromuscular re-education;Patient/family education;Manual techniques;Scar mobilization;Passive range of motion;Energy conservation;Taping   PT Next Visit Plan progression of closed chain strengthening. Gait training with AFO when patient brings to session . continue to improve activity tolerance.     PT Home Exercise Plan no updates this visit; encouraged him to bring his AFO to next visit    Consulted and Agree with Plan of Care Patient      Patient will benefit from skilled therapeutic intervention in order to improve the following deficits and impairments:  Abnormal gait, Decreased endurance, Hypomobility, Decreased scar mobility, Decreased activity tolerance, Decreased strength, Pain, Decreased balance, Decreased mobility, Difficulty walking, Decreased range of motion, Improper body mechanics, Decreased coordination, Impaired flexibility, Postural dysfunction, Decreased knowledge of precautions  Visit Diagnosis: No diagnosis found.     Problem List Patient Active Problem List   Diagnosis Date Noted  . Subtherapeutic anticoagulation   . Acute on chronic  respiratory failure with hypoxia and hypercapnia (HCC)   . Acute on chronic combined systolic and diastolic CHF (congestive heart failure) ([place])   . LV (left ventricular) mural thrombus ([place]) 09/03/2015  . Systolic and diastolic CHF, acute on chronic ([place])   . Hypokalemia   . CHF (congestive heart failure) ([place]) 09/01/2015  . Respiratory failure with hypoxia ([place]) 08/31/2015  . HCAP (healthcare-associated pneumonia) 08/31/2015  . Acute respiratory failure with hypoxia ([place])   . Encounter for central line placement   . Knee swelling   . Subtherapeutic international normalized ratio (INR) 08/08/2015  . Constipation 08/08/2015  . Hyperlipidemia 08/08/2015  . Blurred vision 07/29/2015  . Anemia 07/29/2015  . S/P total knee replacement 07/27/2015  . S/P CABG x 2 10/08/2014  . Chest pain 10/01/2014  . Elevated INR 10/01/2014  . Leukocytosis 10/01/2014  . Thrombocytopenia ([place]) 10/01/2014  . Chest pain at rest 10/01/2014  . Unstable angina ([place]) 10/01/2014  . Coagulopathy ([place]) 10/01/2014  . Paroxysmal atrial fibrillation ([place]) 10/01/2014  . Coronary artery disease   . Hypertension   . Arthritis   . Peripheral vascular disease ([place])   . Essential hypertension     Teena Irani, PTA/CLT [phone] 10/31/2015, 9:54 AM  [facility] [address] [place], [place], [address] Phone: [phone]   Fax:  [phone]  Name: Benjamin Cordova MRN: [id number] Date of Birth: [birth year]

## 2015-11-02 ENCOUNTER — Ambulatory Visit (HOSPITAL_COMMUNITY): Payer: Commercial Managed Care - HMO | Attending: Orthopedic Surgery

## 2015-11-02 DIAGNOSIS — R262 Difficulty in walking, not elsewhere classified: Secondary | ICD-10-CM

## 2015-11-02 DIAGNOSIS — M25662 Stiffness of left knee, not elsewhere classified: Secondary | ICD-10-CM

## 2015-11-02 DIAGNOSIS — M6281 Muscle weakness (generalized): Secondary | ICD-10-CM | POA: Diagnosis not present

## 2015-11-02 DIAGNOSIS — R2681 Unsteadiness on feet: Secondary | ICD-10-CM

## 2015-11-02 DIAGNOSIS — R600 Localized edema: Secondary | ICD-10-CM

## 2015-11-02 NOTE — Therapy (Signed)
[place], [place], [address] Phone: [phone]   Fax:  [phone]  Physical Therapy Treatment  Patient Details  Name: Benjamin Cordova MRN: [id number] Date of Birth: [birth year] Referring Provider: Kathryne Hitch   Encounter Date: 11/02/2015      PT End of Session - 11/02/15 1308    Visit Number 6   Number of Visits 18   Date for PT Re-Evaluation 11/08/15   Authorization Type [employer] Medicare    Authorization Time Period 10/18/15 to 11/29/15   Authorization - Visit Number 6   Authorization - Number of Visits 10   PT Start Time 1300   PT Stop Time 1348   PT Time Calculation (min) 48 min   Equipment Utilized During Treatment Gait belt   Activity Tolerance Patient tolerated treatment well   Behavior During Therapy [facility] for tasks assessed/performed      Past Medical History:  Diagnosis Date  . Arthritis   . Coronary artery disease    Multivessel s/p CABG [facility]  . Essential hypertension   . Hematuria   . High cholesterol   . History of blood transfusion 07/2015   Knee replacement HgB <9  . History of pneumonia   . History of stroke   . Myocardial infarction ([place]) 1980  . Peripheral vascular disease ([place])   . Polio osteopathy of lower leg ([place]) Age 81   Affected right leg  . Skin cancer   . TIA (transient ischemic attack) 1989   Chronic coumadin  . UTI (lower urinary tract infection)     Past Surgical History:  Procedure Laterality Date  . CARDIAC CATHETERIZATION N/A 10/05/2014   Procedure: Left Heart Cath and Coronary Angiography;  Surgeon: Peter M Martinique, MD;  Location: [place] [facility];  Service: Cardiovascular;  Laterality: N/A;  . CORONARY ARTERY BYPASS GRAFT  1981   [facility]  . CORONARY ARTERY BYPASS GRAFT N/A 10/08/2014   Procedure: REDO CORONARY ARTERY BYPASS GRAFTING (CABG) Times Two Grafts using Left Internal Mammary and Left Radial Artery;  Surgeon: Melrose Nakayama,  MD;  Location: [place];  Service: Open Heart Surgery;  Laterality: N/A;  . CYSTOSCOPY WITH INJECTION  10/10/2010   Procedure: CYSTOSCOPY WITH INJECTION;  Surgeon: Marissa Nestle;  [facility];  Service: Urology;  Laterality: N/A;  with retrograde urethrogram  . FOOT SURGERY Right 1954   [facility], muscle implantation  . JOINT REPLACEMENT    . RADIAL ARTERY HARVEST Left 10/08/2014   Procedure: Left RADIAL ARTERY HARVEST;  Surgeon: Melrose Nakayama, MD;  Location: [place];  Service: Open Heart Surgery;  Laterality: Left;  . TEE WITHOUT CARDIOVERSION N/A 10/08/2014   Procedure: TRANSESOPHAGEAL ECHOCARDIOGRAM (TEE);  Surgeon: Melrose Nakayama, MD;  Location: [place];  Service: Open Heart Surgery;  Laterality: N/A;  . TONSILLECTOMY  1949   [facility]  . TOTAL KNEE ARTHROPLASTY Left 07/27/2015   Procedure: TOTAL LEFT KNEE ARTHROPLASTY;  Surgeon: [name], MD;  Location: [place];  Service: Orthopedics;  Laterality: Left;    There were no vitals filed for this visit.      Subjective Assessment - 11/02/15 1259    Subjective Pt stated he is pain free today, stated he forgot his AFO in car.  Reports compliance with HEP daily.  Stated he went walking without AD with no reports of falls in June 2017.   Pertinent History chronic warfarin use, on beta blockers, extensive cardiac  history including CAD/PVD/multiple CABG procedures/HTN/etc, hx respiratory failure, OA, coagulopathy, MI, TIA, CVA, polio, history of R foot surgery, HOH    Patient Stated Goals get stronger, and get his walking better    Currently in Pain? No/denies                         [facility] Adult PT Treatment/Exercise - 11/02/15 0001      Knee/Hip Exercises: Aerobic   Nustep 10 minutes level 3 hills 3 LE only seat 9     Knee/Hip Exercises: Standing   Lateral Step Up Both;10 reps;Step Height: 4";Hand Hold: 2   Lateral Step Up Limitations both 4"   Forward Step Up Both;15 reps;Hand Hold: 1;Step Height: 6";Step Height: 4"    Forward Step Up Limitations Rt: 4", Lt: 6"   Functional Squat 20 reps   Functional Squat Limitations heavy use of UE for momenturm; tactil   Stairs 4" reciprocally 1RT with  1 HR; ascend 6in step to pattern, descend 4 in reciprocal with 1 HR   Gait Training 2 sets x 50' no AD                  PT Short Term Goals - 10/18/15 1223      PT SHORT TERM GOAL #1   Title Patient will demonstrate ROM L knee 0-120 degrees in order to improve gait and overall mobility    Time 3   Period Weeks   Status New     PT SHORT TERM GOAL #2   Title Patient to be able to ambulate with LRAD with correct gait mechanics and minimal unsteadiness to demonstrate improvement of condition    Time 3   Period Weeks   Status New     PT SHORT TERM GOAL #3   Title Patient to be able to tolerate 5-10 minutes of activity without SOB/DOE in order to demonstrate improved strength and functional activity tolerance    Time 3   Period Weeks   Status New     PT SHORT TERM GOAL #4   Title Patient to be correctly and consistently performing appropriate HEP, to be updated PRN    Time 3   Period Weeks   Status New           PT Long Term Goals - 10/18/15 1224      PT LONG TERM GOAL #1   Title Patient to demonstrate strength in all tested muscles 4/5 R LE and 5/5 L LE in order to improve function and gait    Time 6   Period Weeks   Status New     PT LONG TERM GOAL #2   Title Patient to be able to ambulate at least house hold distances with no device and no DOE/SOB in order to demonstrate improved function and tolerance to activity    Time 6   Period Weeks   Status New     PT LONG TERM GOAL #3   Title Patient to report he has been able to return to gym for low intensity activities in short intervals with no DOE/SOB to demonstrate improvement in mobilty and function    Time 6   Period Weeks   Status New     PT LONG TERM GOAL #4   Title Patient to be able to reciprocally ascend/descent full  flight of stairs with U railing, step over step pattern, good eccentric control, and minimal unsteadiness in order to improve mobility  within community    Time 6   Period Weeks   Status New               Plan - 11/02/15 1336    Clinical Impression Statement Session focus on improving functional strengthening and activity tolerance.  Gait training complete without AD with cueing to improve heel to toe pattern Lt LE and increase stride length to normalize gait mechanics.  Pt left AFO for Rt LE in his car though stated he will remember next session.  2 seated rest breaks required through session due to fatigue.  Continues stair training for functional strengthening with ability to complete reciprocal pattern with 4 in step height, unable to ascend with Rt LE due to weakness with 6in step height.  Ended session with Nustep for activity tolerance and to improve slow twitch muscle strengthening.  No reports of pain through session.     Rehab Potential Good   Clinical Impairments Affecting Rehab Potential extensive cardiac history with severe cardiopulmonary limitations, history of polio affecting R LE.    PT Frequency 3x / week   PT Duration 6 weeks   PT Treatment/Interventions ADLs/Self Care Home Management;Cryotherapy;Moist Heat;DME Instruction;Gait training;Stair training;Functional mobility training;Therapeutic activities;Therapeutic exercise;Balance training;Neuromuscular re-education;Patient/family education;Manual techniques;Scar mobilization;Passive range of motion;Energy conservation;Taping   PT Next Visit Plan progression of closed chain strengthening. Gait training with AFO when patient brings to session . continue to improve activity tolerance.        Patient will benefit from skilled therapeutic intervention in order to improve the following deficits and impairments:  Abnormal gait, Decreased endurance, Hypomobility, Decreased scar mobility, Decreased activity tolerance, Decreased  strength, Pain, Decreased balance, Decreased mobility, Difficulty walking, Decreased range of motion, Improper body mechanics, Decreased coordination, Impaired flexibility, Postural dysfunction, Decreased knowledge of precautions  Visit Diagnosis: Stiffness of left knee, not elsewhere classified  Localized edema  Muscle weakness (generalized)  Unsteadiness on feet  Difficulty in walking, not elsewhere classified     Problem List Patient Active Problem List   Diagnosis Date Noted  . Subtherapeutic anticoagulation   . Acute on chronic respiratory failure with hypoxia and hypercapnia (HCC)   . Acute on chronic combined systolic and diastolic CHF (congestive heart failure) ([place])   . LV (left ventricular) mural thrombus ([place]) 09/03/2015  . Systolic and diastolic CHF, acute on chronic ([place])   . Hypokalemia   . CHF (congestive heart failure) ([place]) 09/01/2015  . Respiratory failure with hypoxia ([place]) 08/31/2015  . HCAP (healthcare-associated pneumonia) 08/31/2015  . Acute respiratory failure with hypoxia ([place])   . Encounter for central line placement   . Knee swelling   . Subtherapeutic international normalized ratio (INR) 08/08/2015  . Constipation 08/08/2015  . Hyperlipidemia 08/08/2015  . Blurred vision 07/29/2015  . Anemia 07/29/2015  . S/P total knee replacement 07/27/2015  . S/P CABG x 2 10/08/2014  . Chest pain 10/01/2014  . Elevated INR 10/01/2014  . Leukocytosis 10/01/2014  . Thrombocytopenia ([place]) 10/01/2014  . Chest pain at rest 10/01/2014  . Unstable angina ([place]) 10/01/2014  . Coagulopathy ([place]) 10/01/2014  . Paroxysmal atrial fibrillation ([place]) 10/01/2014  . Coronary artery disease   . Hypertension   . Arthritis   . Peripheral vascular disease ([place])   . Essential hypertension    Ihor Austin, LPTA; [place]  Aldona Lento 11/02/2015, 1:53 PM  [place] [address] [place], [place],  [address] Phone: [phone]   Fax:  [phone]  Name: Abdirizak  MATTOX BUSCAGLIA MRN: [id number] Date of Birth: 1935/03/24

## 2015-11-04 ENCOUNTER — Ambulatory Visit (HOSPITAL_COMMUNITY): Payer: Commercial Managed Care - HMO | Admitting: Physical Therapy

## 2015-11-04 DIAGNOSIS — M6281 Muscle weakness (generalized): Secondary | ICD-10-CM | POA: Diagnosis not present

## 2015-11-04 DIAGNOSIS — R2681 Unsteadiness on feet: Secondary | ICD-10-CM | POA: Diagnosis not present

## 2015-11-04 DIAGNOSIS — M25662 Stiffness of left knee, not elsewhere classified: Secondary | ICD-10-CM

## 2015-11-04 DIAGNOSIS — R600 Localized edema: Secondary | ICD-10-CM | POA: Diagnosis not present

## 2015-11-04 DIAGNOSIS — R262 Difficulty in walking, not elsewhere classified: Secondary | ICD-10-CM

## 2015-11-04 NOTE — Therapy (Signed)
[place], [place], [address] Phone: [phone]   Fax:  [phone]  Physical Therapy Treatment  Patient Details  Name: Benjamin Cordova MRN: [id number] Date of Birth: 1934-07-23 Referring Provider: Kathryne Hitch   Encounter Date: 11/04/2015      PT End of Session - 11/04/15 1517    Visit Number 7   Number of Visits 18   Date for PT Re-Evaluation 11/08/15   Authorization Type [employer] Medicare    Authorization Time Period 10/18/15 to 11/29/15   Authorization - Visit Number 7   Authorization - Number of Visits 10   PT Start Time [id number]   PT Stop Time 1434   PT Time Calculation (min) 46 min   Equipment Utilized During Treatment Gait belt   Activity Tolerance Patient tolerated treatment well   Behavior During Therapy [facility] for tasks assessed/performed      Past Medical History:  Diagnosis Date  . Arthritis   . Coronary artery disease    Multivessel s/p CABG [facility] at [facility]  . Essential hypertension   . Hematuria   . High cholesterol   . History of blood transfusion 07/2015   Knee replacement HgB <9  . History of pneumonia   . History of stroke   . Myocardial infarction ([place]) 1980  . Peripheral vascular disease ([place])   . Polio osteopathy of lower leg ([place]) Age 38   Affected right leg  . Skin cancer   . TIA (transient ischemic attack) 1989   Chronic coumadin  . UTI (lower urinary tract infection)     Past Surgical History:  Procedure Laterality Date  . CARDIAC CATHETERIZATION N/A 10/05/2014   Procedure: Left Heart Cath and Coronary Angiography;  Surgeon: Peter M Martinique, MD;  Location: [place] [facility];  Service: Cardiovascular;  Laterality: N/A;  . CORONARY ARTERY BYPASS GRAFT  1981   [facility]  . CORONARY ARTERY BYPASS GRAFT N/A 10/08/2014   Procedure: REDO CORONARY ARTERY BYPASS GRAFTING (CABG) Times Two Grafts using Left Internal Mammary and Left Radial Artery;  Surgeon: Melrose Nakayama,  MD;  Location: [place];  Service: Open Heart Surgery;  Laterality: N/A;  . CYSTOSCOPY WITH INJECTION  10/10/2010   Procedure: CYSTOSCOPY WITH INJECTION;  Surgeon: Marissa Nestle;  [facility];  Service: Urology;  Laterality: N/A;  with retrograde urethrogram  . FOOT SURGERY Right 1954   [facility], muscle implantation  . JOINT REPLACEMENT    . RADIAL ARTERY HARVEST Left 10/08/2014   Procedure: Left RADIAL ARTERY HARVEST;  Surgeon: Melrose Nakayama, MD;  Location: [place];  Service: Open Heart Surgery;  Laterality: Left;  . TEE WITHOUT CARDIOVERSION N/A 10/08/2014   Procedure: TRANSESOPHAGEAL ECHOCARDIOGRAM (TEE);  Surgeon: Melrose Nakayama, MD;  Location: [place];  Service: Open Heart Surgery;  Laterality: N/A;  . TONSILLECTOMY  1949   [facility]  . TOTAL KNEE ARTHROPLASTY Left 07/27/2015   Procedure: TOTAL LEFT KNEE ARTHROPLASTY;  Surgeon: [name], MD;  Location: [place];  Service: Orthopedics;  Laterality: Left;    There were no vitals filed for this visit.      Subjective Assessment - 11/04/15 1351    Subjective Pt states things are going well today. He lost his AFO and is not sure where he lost it. He has no pain currently, and he's been busy lately.    Pertinent History chronic warfarin use, on beta blockers, extensive cardiac history including CAD/PVD/multiple CABG procedures/HTN/etc,  hx respiratory failure, OA, coagulopathy, MI, TIA, CVA, polio, history of R foot surgery, HOH    Patient Stated Goals get stronger, and get his walking better    Currently in Pain? No/denies                         [facility] Adult PT Treatment/Exercise - 11/04/15 0001      Knee/Hip Exercises: Aerobic   Nustep 7 minutes L5, UE/LE, seat 9      Knee/Hip Exercises: Standing   Lateral Step Up Both;1 set;Hand Hold: 1;Step Height: 4";15 reps   Forward Step Up Both;2 sets;15 reps;Hand Hold: 1;Step Height: 4";Step Height: 6"   Forward Step Up Limitations Rt: 4", Lt; 6"     Knee/Hip Exercises:  Seated   Sit to Sand 1 set;20 reps;without UE support  2" box under LLE.              Balance Exercises - 11/04/15 1417      Balance Exercises: Standing   Standing Eyes Opened Narrow base of support (BOS);Foam/compliant surface  external perturbations x20 reps    Standing Eyes Closed Foam/compliant surface;2 reps;30 secs   Tandem Stance Eyes open;2 reps;30 secs;Intermittent upper extremity support           PT Education - 11/04/15 1516    Education provided Yes   Education Details obtaining new order from MD concerning AFO   Person(s) Educated Patient   Methods Explanation   Comprehension Verbalized understanding          PT Short Term Goals - 10/18/15 1223      PT SHORT TERM GOAL #1   Title Patient will demonstrate ROM L knee 0-120 degrees in order to improve gait and overall mobility    Time 3   Period Weeks   Status New     PT SHORT TERM GOAL #2   Title Patient to be able to ambulate with LRAD with correct gait mechanics and minimal unsteadiness to demonstrate improvement of condition    Time 3   Period Weeks   Status New     PT SHORT TERM GOAL #3   Title Patient to be able to tolerate 5-10 minutes of activity without SOB/DOE in order to demonstrate improved strength and functional activity tolerance    Time 3   Period Weeks   Status New     PT SHORT TERM GOAL #4   Title Patient to be correctly and consistently performing appropriate HEP, to be updated PRN    Time 3   Period Weeks   Status New           PT Long Term Goals - 10/18/15 1224      PT LONG TERM GOAL #1   Title Patient to demonstrate strength in all tested muscles 4/5 R LE and 5/5 L LE in order to improve function and gait    Time 6   Period Weeks   Status New     PT LONG TERM GOAL #2   Title Patient to be able to ambulate at least house hold distances with no device and no DOE/SOB in order to demonstrate improved function and tolerance to activity    Time 6   Period Weeks    Status New     PT LONG TERM GOAL #3   Title Patient to report he has been able to return to gym for low intensity activities in short intervals with no DOE/SOB to demonstrate  improvement in mobilty and function    Time 6   Period Weeks   Status New     PT LONG TERM GOAL #4   Title Patient to be able to reciprocally ascend/descent full flight of stairs with U railing, step over step pattern, good eccentric control, and minimal unsteadiness in order to improve mobility within community    Time 6   Period Weeks   Status New               Plan - 11/04/15 1517    Clinical Impression Statement Today's session focused on progressions of LE strength and balance. Pt able to tolerate all increased reps and height with step ups, requiring several rest breaks during the session. Ended session with Nustep and increased resistance to improve LE strength and endurance. Will continue with current POC.    Rehab Potential Good   Clinical Impairments Affecting Rehab Potential extensive cardiac history with severe cardiopulmonary limitations, history of polio affecting R LE.    PT Frequency 3x / week   PT Duration 6 weeks   PT Treatment/Interventions ADLs/Self Care Home Management;Cryotherapy;Moist Heat;DME Instruction;Gait training;Stair training;Functional mobility training;Therapeutic activities;Therapeutic exercise;Balance training;Neuromuscular re-education;Patient/family education;Manual techniques;Scar mobilization;Passive range of motion;Energy conservation;Taping   PT Next Visit Plan progression of closed chain strengthening. Balance (dynamic); continue to improve activity tolerance.     PT Home Exercise Plan no updates    Recommended Other Services Order for new AFO   Consulted and Agree with Plan of Care Patient      Patient will benefit from skilled therapeutic intervention in order to improve the following deficits and impairments:  Abnormal gait, Decreased endurance, Hypomobility,  Decreased scar mobility, Decreased activity tolerance, Decreased strength, Pain, Decreased balance, Decreased mobility, Difficulty walking, Decreased range of motion, Improper body mechanics, Decreased coordination, Impaired flexibility, Postural dysfunction, Decreased knowledge of precautions  Visit Diagnosis: Stiffness of left knee, not elsewhere classified  Localized edema  Difficulty in walking, not elsewhere classified  Muscle weakness (generalized)  Unsteadiness on feet     Problem List Patient Active Problem List   Diagnosis Date Noted  . Subtherapeutic anticoagulation   . Acute on chronic respiratory failure with hypoxia and hypercapnia (HCC)   . Acute on chronic combined systolic and diastolic CHF (congestive heart failure) ([place])   . LV (left ventricular) mural thrombus ([place]) 09/03/2015  . Systolic and diastolic CHF, acute on chronic ([place])   . Hypokalemia   . CHF (congestive heart failure) ([place]) 09/01/2015  . Respiratory failure with hypoxia ([place]) 08/31/2015  . HCAP (healthcare-associated pneumonia) 08/31/2015  . Acute respiratory failure with hypoxia ([place])   . Encounter for central line placement   . Knee swelling   . Subtherapeutic international normalized ratio (INR) 08/08/2015  . Constipation 08/08/2015  . Hyperlipidemia 08/08/2015  . Blurred vision 07/29/2015  . Anemia 07/29/2015  . S/P total knee replacement 07/27/2015  . S/P CABG x 2 10/08/2014  . Chest pain 10/01/2014  . Elevated INR 10/01/2014  . Leukocytosis 10/01/2014  . Thrombocytopenia ([facility]) 10/01/2014  . Chest pain at rest 10/01/2014  . Unstable angina ([place] [address]) 10/01/2014  . Coagulopathy ([place]) 10/01/2014  . Paroxysmal atrial fibrillation ([place]) 10/01/2014  . Coronary artery disease   . Hypertension   . Arthritis   . Peripheral vascular disease ([place])   . Essential hypertension     3:26 PM,11/04/15 Elly Modena PT, DPT [facility] [place],  [place], [address] Phone: [phone]   Fax:  [phone]  Name: KINNITH OMDAHL MRN: [id number] Date of Birth: 1934/06/27

## 2015-11-07 ENCOUNTER — Ambulatory Visit (HOSPITAL_COMMUNITY): Payer: Commercial Managed Care - HMO | Admitting: Physical Therapy

## 2015-11-07 DIAGNOSIS — R2681 Unsteadiness on feet: Secondary | ICD-10-CM

## 2015-11-07 DIAGNOSIS — M6281 Muscle weakness (generalized): Secondary | ICD-10-CM | POA: Diagnosis not present

## 2015-11-07 DIAGNOSIS — M25662 Stiffness of left knee, not elsewhere classified: Secondary | ICD-10-CM

## 2015-11-07 DIAGNOSIS — R600 Localized edema: Secondary | ICD-10-CM

## 2015-11-07 DIAGNOSIS — R262 Difficulty in walking, not elsewhere classified: Secondary | ICD-10-CM | POA: Diagnosis not present

## 2015-11-07 NOTE — Therapy (Signed)
[place], [place], [address] Phone: [phone]   Fax:  [phone]  Physical Therapy Treatment  Patient Details  Name: Benjamin Cordova MRN: [id number] Date of Birth: 05-05-1934 Referring Provider: Kathryne Hitch   Encounter Date: 11/07/2015      PT End of Session - 11/07/15 1343    Visit Number 8   Number of Visits 18   Date for PT Re-Evaluation 11/08/15   Authorization Type [employer] Medicare    Authorization Time Period 10/18/15 to 11/29/15   Authorization - Visit Number 8   Authorization - Number of Visits 10   PT Start Time 1306   PT Stop Time 1352   PT Time Calculation (min) 46 min   Equipment Utilized During Treatment Gait belt   Activity Tolerance Patient tolerated treatment well   Behavior During Therapy [facility] for tasks assessed/performed      Past Medical History:  Diagnosis Date  . Arthritis   . Coronary artery disease    Multivessel s/p CABG 1981 at [facility]  . Essential hypertension   . Hematuria   . High cholesterol   . History of blood transfusion 07/2015   Knee replacement HgB <9  . History of pneumonia   . History of stroke   . Myocardial infarction ([place]) 1980  . Peripheral vascular disease ([place])   . 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 Skin cancer   . TIA (transient ischemic attack) 1989   Chronic coumadin  . UTI (lower urinary tract infection)     Past Surgical History:  Procedure Laterality Date  . CARDIAC CATHETERIZATION N/A 10/05/2014   Procedure: Left Heart Cath and Coronary Angiography;  Surgeon: Peter M Martinique, MD;  Location: [place] [facility];  Service: Cardiovascular;  Laterality: N/A;  . CORONARY ARTERY BYPASS GRAFT  1981   [facility]  . CORONARY ARTERY BYPASS GRAFT N/A 10/08/2014   Procedure: REDO CORONARY ARTERY BYPASS GRAFTING (CABG) Times Two Grafts using Left Internal Mammary and Left Radial Artery;  Surgeon: Melrose Nakayama,  MD;  Location: [place];  Service: Open Heart Surgery;  Laterality: N/A;  . CYSTOSCOPY WITH INJECTION  10/10/2010   Procedure: CYSTOSCOPY WITH INJECTION;  Surgeon: Marissa Nestle;  [facility];  Service: Urology;  Laterality: N/A;  with retrograde urethrogram  . FOOT SURGERY Right 1954   [facility], muscle implantation  . JOINT REPLACEMENT    . RADIAL ARTERY HARVEST Left 10/08/2014   Procedure: Left RADIAL ARTERY HARVEST;  Surgeon: Melrose Nakayama, MD;  Location: [place];  Service: Open Heart Surgery;  Laterality: Left;  . TEE WITHOUT CARDIOVERSION N/A 10/08/2014   Procedure: TRANSESOPHAGEAL ECHOCARDIOGRAM (TEE);  Surgeon: Melrose Nakayama, MD;  Location: [place];  Service: Open Heart Surgery;  Laterality: N/A;  . TONSILLECTOMY  1949   [facility]  . TOTAL KNEE ARTHROPLASTY Left 07/27/2015   Procedure: TOTAL LEFT KNEE ARTHROPLASTY;  Surgeon: [name], MD;  Location: [place];  Service: Orthopedics;  Laterality: Left;    There were no vitals filed for this visit.      Subjective Assessment - 11/07/15 1311    Subjective Pt states he still has not located his AFO but is still looking for it.  Reports no pain currently.     Currently in Pain? No/denies  [facility] Adult PT Treatment/Exercise - 11/07/15 0001      Knee/Hip Exercises: Standing   Lateral Step Up Both;1 set;Hand Hold: 1;Step Height: 4";15 reps   Lateral Step Up Limitations 4"   Forward Step Up Both;15 reps;Hand Hold: 1;Step Height: 4";Step Height: 6"   Forward Step Up Limitations 6"   Step Down Right;10 reps;Hand Hold: 1;Step Height: 4"   Step Down Limitations 4"   Stairs 4" reciprocally 1RT with  1 HR; ascend 6in step to pattern, descend 4 in reciprocal with 1 HR   SLS with Vectors Rt LE with 1 HHA taking into flexion only 5X5" holds   Gait Training 175 no AD   Other Standing Knee Exercises side stepping 1Rt                  PT Short Term Goals - 10/18/15 1223      PT SHORT  TERM GOAL #1   Title Patient will demonstrate ROM L knee 0-120 degrees in order to improve gait and overall mobility    Time 3   Period Weeks   Status New     PT SHORT TERM GOAL #2   Title Patient to be able to ambulate with LRAD with correct gait mechanics and minimal unsteadiness to demonstrate improvement of condition    Time 3   Period Weeks   Status New     PT SHORT TERM GOAL #3   Title Patient to be able to tolerate 5-10 minutes of activity without SOB/DOE in order to demonstrate improved strength and functional activity tolerance    Time 3   Period Weeks   Status New     PT SHORT TERM GOAL #4   Title Patient to be correctly and consistently performing appropriate HEP, to be updated PRN    Time 3   Period Weeks   Status New           PT Long Term Goals - 10/18/15 1224      PT LONG TERM GOAL #1   Title Patient to demonstrate strength in all tested muscles 4/5 R LE and 5/5 L LE in order to improve function and gait    Time 6   Period Weeks   Status New     PT LONG TERM GOAL #2   Title Patient to be able to ambulate at least house hold distances with no device and no DOE/SOB in order to demonstrate improved function and tolerance to activity    Time 6   Period Weeks   Status New     PT LONG TERM GOAL #3   Title Patient to report he has been able to return to gym for low intensity activities in short intervals with no DOE/SOB to demonstrate improvement in mobilty and function    Time 6   Period Weeks   Status New     PT LONG TERM GOAL #4   Title Patient to be able to reciprocally ascend/descent full flight of stairs with U railing, step over step pattern, good eccentric control, and minimal unsteadiness in order to improve mobility within community    Time 6   Period Weeks   Status New               Plan - 11/07/15 1346    Clinical Impression Statement Focuses session on improving Rt LE stability, functional strength and balance.  Improved ability to  negotiate steps reciprocally, however relys on B UE assistance.  Increased distance to  175 feet this session ambulating without AD.  Noted fatigue last 25 feet of ambulation.  Added SLS on Rt LE with Lt LE flexion holds to challenge stabilization of Rt LE.  Extreme difficulty to make larger steps with side stepping activity due to Rt LE instability.   Completed session with nustep (no charge) at [facility] to increase slow twitch muscle strength and activity tolerance.     Rehab Potential Good   Clinical Impairments Affecting Rehab Potential extensive cardiac history with severe cardiopulmonary limitations, history of polio affecting R LE.    PT Frequency 3x / week   PT Duration 6 weeks   PT Treatment/Interventions ADLs/Self Care Home Management;Cryotherapy;Moist Heat;DME Instruction;Gait training;Stair training;Functional mobility training;Therapeutic activities;Therapeutic exercise;Balance training;Neuromuscular re-education;Patient/family education;Manual techniques;Scar mobilization;Passive range of motion;Energy conservation;Taping   PT Next Visit Plan progression of closed chain strengthening. Gait training with AFO when patient brings to session . continue to improve activity tolerance.        Patient will benefit from skilled therapeutic intervention in order to improve the following deficits and impairments:  Abnormal gait, Decreased endurance, Hypomobility, Decreased scar mobility, Decreased activity tolerance, Decreased strength, Pain, Decreased balance, Decreased mobility, Difficulty walking, Decreased range of motion, Improper body mechanics, Decreased coordination, Impaired flexibility, Postural dysfunction, Decreased knowledge of precautions  Visit Diagnosis: Stiffness of left knee, not elsewhere classified  Localized edema  Difficulty in walking, not elsewhere classified  Muscle weakness (generalized)  Unsteadiness on feet     Problem List Patient Active Problem List   Diagnosis  Date Noted  . Subtherapeutic anticoagulation   . Acute on chronic respiratory failure with hypoxia and hypercapnia (HCC)   . Acute on chronic combined systolic and diastolic CHF (congestive heart failure) ([place])   . LV (left ventricular) mural thrombus ([place]) 09/03/2015  . Systolic and diastolic CHF, acute on chronic ([place])   . Hypokalemia   . CHF (congestive heart failure) ([place]) 09/01/2015  . Respiratory failure with hypoxia ([place]) 08/31/2015  . HCAP (healthcare-associated pneumonia) 08/31/2015  . Acute respiratory failure with hypoxia ([place])   . Encounter for central line placement   . Knee swelling   . Subtherapeutic international normalized ratio (INR) 08/08/2015  . Constipation 08/08/2015  . Hyperlipidemia 08/08/2015  . Blurred vision 07/29/2015  . Anemia 07/29/2015  . S/P total knee replacement 07/27/2015  . S/P CABG x 2 10/08/2014  . Chest pain 10/01/2014  . Elevated INR 10/01/2014  . Leukocytosis 10/01/2014  . Thrombocytopenia ([place]) 10/01/2014  . Chest pain at rest 10/01/2014  . Unstable angina ([place]) 10/01/2014  . Coagulopathy ([place]) 10/01/2014  . Paroxysmal atrial fibrillation ([place]) 10/01/2014  . Coronary artery disease   . Hypertension   . Arthritis   . Peripheral vascular disease ([place])   . Essential hypertension     Teena Irani, PTA/CLT [id number]  11/07/2015, 1:50 PM  [facility] [address] [place], [place], [address] Phone: [phone]   Fax:  [phone]  Name: Benjamin Cordova MRN: [id number] Date of Birth: [birth year]

## 2015-11-09 ENCOUNTER — Ambulatory Visit (HOSPITAL_COMMUNITY): Payer: Commercial Managed Care - HMO

## 2015-11-09 VITALS — HR 79

## 2015-11-09 DIAGNOSIS — R600 Localized edema: Secondary | ICD-10-CM | POA: Diagnosis not present

## 2015-11-09 DIAGNOSIS — R2681 Unsteadiness on feet: Secondary | ICD-10-CM | POA: Diagnosis not present

## 2015-11-09 DIAGNOSIS — M6281 Muscle weakness (generalized): Secondary | ICD-10-CM

## 2015-11-09 DIAGNOSIS — R262 Difficulty in walking, not elsewhere classified: Secondary | ICD-10-CM | POA: Diagnosis not present

## 2015-11-09 DIAGNOSIS — M25662 Stiffness of left knee, not elsewhere classified: Secondary | ICD-10-CM | POA: Diagnosis not present

## 2015-11-09 NOTE — Therapy (Signed)
[place], [place], [address] Phone: [phone]   Fax:  [phone]  Physical Therapy Treatment  Patient Details  Name: Benjamin Cordova MRN: [id number] Date of Birth: 1935-01-29 Referring Provider: Kathryne Hitch  Encounter Date: 11/09/2015      PT End of Session - 11/09/15 1458    Visit Number 9   Number of Visits 18   Date for PT Re-Evaluation 11/29/15   Authorization Type [employer] Medicare    Authorization Time Period 10/18/15 to 11/29/15   Authorization - Visit Number 9   Authorization - Number of Visits 19   PT Start Time [id number]   PT Stop Time 1518   PT Time Calculation (min) 46 min   Equipment Utilized During Treatment Gait belt   Activity Tolerance Patient tolerated treatment well   Behavior During Therapy [facility] for tasks assessed/performed      Past Medical History:  Diagnosis Date  . Arthritis   . Coronary artery disease    Multivessel s/p CABG [facility] at [facility]  . Essential hypertension   . Hematuria   . High cholesterol   . History of blood transfusion 07/2015   Knee replacement HgB <9  . History of pneumonia   . History of stroke   . Myocardial infarction ([place]) 1980  . Peripheral vascular disease ([place])   . Polio osteopathy of lower leg ([place]) Age 56   Affected right leg  . Skin cancer   . TIA (transient ischemic attack) 1989   Chronic coumadin  . UTI (lower urinary tract infection)     Past Surgical History:  Procedure Laterality Date  . CARDIAC CATHETERIZATION N/A 10/05/2014   Procedure: Left Heart Cath and Coronary Angiography;  Surgeon: Peter M Martinique, MD;  Location: [place] [facility];  Service: Cardiovascular;  Laterality: N/A;  . CORONARY ARTERY BYPASS GRAFT  1981   [facility]  . CORONARY ARTERY BYPASS GRAFT N/A 10/08/2014   Procedure: REDO CORONARY ARTERY BYPASS GRAFTING (CABG) Times Two Grafts using Left Internal Mammary and Left Radial Artery;  Surgeon: Melrose Nakayama,  MD;  Location: [place];  Service: Open Heart Surgery;  Laterality: N/A;  . CYSTOSCOPY WITH INJECTION  10/10/2010   Procedure: CYSTOSCOPY WITH INJECTION;  Surgeon: Marissa Nestle;  [facility];  Service: Urology;  Laterality: N/A;  with retrograde urethrogram  . FOOT SURGERY Right 1954   [facility], muscle implantation  . JOINT REPLACEMENT    . RADIAL ARTERY HARVEST Left 10/08/2014   Procedure: Left RADIAL ARTERY HARVEST;  Surgeon: Melrose Nakayama, MD;  Location: [place];  Service: Open Heart Surgery;  Laterality: Left;  . TEE WITHOUT CARDIOVERSION N/A 10/08/2014   Procedure: TRANSESOPHAGEAL ECHOCARDIOGRAM (TEE);  Surgeon: Melrose Nakayama, MD;  Location: [place];  Service: Open Heart Surgery;  Laterality: N/A;  . TONSILLECTOMY  1949   [facility]  . TOTAL KNEE ARTHROPLASTY Left 07/27/2015   Procedure: TOTAL LEFT KNEE ARTHROPLASTY;  Surgeon: [name], MD;  Location: [facility];  Service: Orthopedics;  Laterality: Left;    Vitals:   11/09/15 1621  Pulse: 79        Subjective Assessment - 11/09/15 1434    Subjective Pt stated he is feeling good today, no compliants of pain.  Main difficulty wiht Rt foot drop gait and weakness.   Pertinent History chronic warfarin use, on beta blockers, extensive cardiac history including CAD/PVD/multiple CABG procedures/HTN/etc, hx respiratory failure, OA, coagulopathy, MI, TIA, CVA,  polio, history of R foot surgery, HOH    How long can you stand comfortably? 15-20 minutes    How long can you walk comfortably? with crutch, 200-324ft    Patient Stated Goals get stronger, and get his walking better    Currently in Pain? No/denies            [facility] PT Assessment - 11/09/15 0001      Assessment   Medical Diagnosis L TKR    Referring Provider Kathryne Hitch   Onset Date/Surgical Date 07/29/15   Next MD Visit Dr. Percell Miller in October, 2017     Observation/Other Assessments   Focus on Therapeutic Outcomes (FOTO)  54% limited  was 56% limited     AROM    Left Knee Extension 1  was 10   Left Knee Flexion 112  was 118     Strength   Right Hip Flexion 5/5  was 4/5   Right Hip Extension --  unable to lay on stomach   Right Hip ABduction 3/5  was 3/5   Left Hip Flexion 5/5  was 4/5   Left Hip Extension --  unable to lay on stomach   Left Hip ABduction 3+/5  was 4/5   Right Knee Flexion --  was 3/5   Right Knee Extension 4/5  was 4-/5   Left Knee Flexion --  was 4/5   Left Knee Extension 5/5  was 4/5   Right Ankle Dorsiflexion 0/5  was 0/5   Left Ankle Dorsiflexion 4/5  was 5/5     Ambulation/Gait   Gait Comments foot drop/generalized weakness R LE due to hx of polio; reduced TKE L LE, reduced heel toe; reduced gait speed; limited by cardiopulm endurance      6 minute walk test results    Aerobic Endurance Distance Walked 226  was 72   Endurance additional comments 3MWT, crutch      High Level Balance   High Level Balance Comments TUG  1 crutch   was TUG 26.26 crutch        **PT assessment portion of today's visit was completed by Tacy Learn, PT, DPT**              [facility] Adult PT Treatment/Exercise - 11/09/15 0001      Knee/Hip Exercises: Standing   Lateral Step Up Both;1 set;Hand Hold: 1;Step Height: 4";15 reps   Lateral Step Up Limitations 4"   Forward Step Up Right;Step Height: 4";Left;Step Height: 6";Both;15 reps   Step Down Right;10 reps;Hand Hold: 1;Step Height: 4"   Step Down Limitations 4"   Stairs reciprocal pattern 4' and 7" heights                PT Education - 11/09/15 1457    Education provided Yes   Education Details Given signed referral for Rt AFO, educated on benefits and places for purchase   Person(s) Educated Patient   Methods Explanation;Handout  AFO referral form given   Comprehension Verbalized understanding;Returned demonstration          PT Short Term Goals - 11/09/15 1442      PT SHORT TERM GOAL #1   Title Patient will demonstrate ROM L knee 0-120  degrees in order to improve gait and overall mobility    Baseline 11/09/2015:  AROM 1-112 degrees   Status On-going     PT SHORT TERM GOAL #2   Title Patient to be able to ambulate with LRAD with correct gait mechanics and  minimal unsteadiness to demonstrate improvement of condition    Baseline 11/09/2015:  Ambulates with 1 crutch to assist wtih Rt foot drop     PT SHORT TERM GOAL #3   Title Patient to be able to tolerate 5-10 minutes of activity without SOB/DOE in order to demonstrate improved strength and functional activity tolerance    Baseline 11/09/2015:      PT SHORT TERM GOAL #4   Title Patient to be correctly and consistently performing appropriate HEP, to be updated PRN    Baseline 11/09/2015:  Reports compliance with HEP at least 4 days per week   Status Achieved           PT Long Term Goals - 11/09/15 1444      PT LONG TERM GOAL #1   Title Patient to demonstrate strength in all tested muscles 4/5 R LE and 5/5 L LE in order to improve function and gait      PT LONG TERM GOAL #2   Title Patient to be able to ambulate at least house hold distances with no device and no DOE/SOB in order to demonstrate improved function and tolerance to activity      PT LONG TERM GOAL #3   Title Patient to report he has been able to return to gym for low intensity activities in short intervals with no DOE/SOB to demonstrate improvement in mobilty and function      PT LONG TERM GOAL #4   Title Patient to be able to reciprocally ascend/descent full flight of stairs with U railing, step over step pattern, good eccentric control, and minimal unsteadiness in order to improve mobility within community                Plan - 11/09/15 1618    Clinical Impression Statement Pt assessed after L TKA and demonstrates some improvement in LE strength, however generally the same.  He demonstrated improvement with the 3MWT from 23ft up to 237ft with the crutch, and he improved with his TUG time.   FOTO improved from a 43% to a 45%.  However, it is noted that he still demonstrates poor safety awareness with stand<>sit where he does not get himself fully lined up with the chair prior to sitting.  R knee AROM has varied for flexion from 112* obtained today up to 118* obtained on 7/25.  He has demonstrated excellent improvement with extension, and was only lacking 1* from full extension.  P would benefit from continued PT to progress his functional mobility, as well as continue education on safety during mobility, AROM of L knee, and functional strength.      Rehab Potential Good   Clinical Impairments Affecting Rehab Potential extensive cardiac history with severe cardiopulmonary limitations, history of polio affecting R LE.    PT Frequency 3x / week   PT Duration 6 weeks   PT Treatment/Interventions ADLs/Self Care Home Management;Cryotherapy;Moist Heat;DME Instruction;Gait training;Stair training;Functional mobility training;Therapeutic activities;Therapeutic exercise;Balance training;Neuromuscular re-education;Patient/family education;Manual techniques;Scar mobilization;Passive range of motion;Energy conservation;Taping   PT Next Visit Plan Progression of gait with cane and with no AD, safety with transfers sit<>stand, and progression of closed chained activities.    Consulted and Agree with Plan of Care Patient      Patient will benefit from skilled therapeutic intervention in order to improve the following deficits and impairments:     Visit Diagnosis: Stiffness of left knee, not elsewhere classified  Localized edema  Difficulty in walking, not elsewhere classified  Muscle weakness (generalized)  Unsteadiness on feet       G-Codes - Nov 18, 2015 1621    Functional Assessment Tool Used Based on skilled clinical assessment of strength, gait, balance, ROM, cardiopulmonary endurance, and FOTO   Functional Limitation Mobility: Walking and moving around   Mobility: Walking and Moving  Around Current Status [phone]) At least 40 percent but less than 60 percent impaired, limited or restricted   Mobility: Walking and Moving Around Goal Status [phone]) At least 20 percent but less than 40 percent impaired, limited or restricted      Problem List Patient Active Problem List   Diagnosis Date Noted  . Subtherapeutic anticoagulation   . Acute on chronic respiratory failure with hypoxia and hypercapnia (HCC)   . Acute on chronic combined systolic and diastolic CHF (congestive heart failure) ([place])   . LV (left ventricular) mural thrombus ([place]) 09/03/2015  . Systolic and diastolic CHF, acute on chronic ([place])   . Hypokalemia   . CHF (congestive heart failure) ([place]) 09/01/2015  . Respiratory failure with hypoxia ([place]) 08/31/2015  . HCAP (healthcare-associated pneumonia) 08/31/2015  . Acute respiratory failure with hypoxia ([place])   . Encounter for central line placement   . Knee swelling   . Subtherapeutic international normalized ratio (INR) 08/08/2015  . Constipation 08/08/2015  . Hyperlipidemia 08/08/2015  . Blurred vision 07/29/2015  . Anemia 07/29/2015  . S/P total knee replacement 07/27/2015  . S/P CABG x 2 10/08/2014  . Chest pain 10/01/2014  . Elevated INR 10/01/2014  . Leukocytosis 10/01/2014  . Thrombocytopenia ([place]) 10/01/2014  . Chest pain at rest 10/01/2014  . Unstable angina ([place]) 10/01/2014  . Coagulopathy ([place]) 10/01/2014  . Paroxysmal atrial fibrillation ([place]) 10/01/2014  . Coronary artery disease   . Hypertension   . Arthritis   . Peripheral vascular disease ([place])   . Essential hypertension    Ihor Austin, [place]; [place]  Aldona Lento Nov 18, 2015, 6:10 PM   Beth Dininger, PT, DPT 11/10/2015  [facility] [address] [place], [place], [address] Phone: [phone]   Fax:  [phone]  Name: Benjamin Cordova MRN: [id number] Date of Birth: [birth year]

## 2015-11-11 ENCOUNTER — Ambulatory Visit (HOSPITAL_COMMUNITY): Payer: Commercial Managed Care - HMO

## 2015-11-11 DIAGNOSIS — R2681 Unsteadiness on feet: Secondary | ICD-10-CM

## 2015-11-11 DIAGNOSIS — R262 Difficulty in walking, not elsewhere classified: Secondary | ICD-10-CM

## 2015-11-11 DIAGNOSIS — R600 Localized edema: Secondary | ICD-10-CM

## 2015-11-11 DIAGNOSIS — M25662 Stiffness of left knee, not elsewhere classified: Secondary | ICD-10-CM | POA: Diagnosis not present

## 2015-11-11 DIAGNOSIS — M6281 Muscle weakness (generalized): Secondary | ICD-10-CM

## 2015-11-11 NOTE — Patient Instructions (Signed)
Abduction: Side Leg Lift (Eccentric) - Side-Lying    Lie on side. Lift top leg slightly higher than shoulder level. Keep top leg straight with body, toes pointing forward. Slowly lower for 3-5 seconds. 15 reps per set, 1-2 sets per day.  http://ecce.exer.us/62   Copyright  VHI. All rights reserved.   Functional Quadriceps: Sit to Stand    Sit on edge of chair, feet flat on floor. Stand upright, extending knees fully. Repeat 10-20 times per set. Do 1-2 sets per session.  http://orth.exer.us/734   Copyright  VHI. All rights reserved.

## 2015-11-11 NOTE — Therapy (Signed)
[facility] [address] [place], [place], [address] Phone: [phone]   Fax:  [phone]  Physical Therapy Treatment  Patient Details  Name: Benjamin Cordova MRN: [id number] Date of Birth: 25-May-1934 Referring Provider: Kathryne Hitch  Encounter Date: 11/11/2015      PT End of Session - 11/11/15 1336    Visit Number 10   Number of Visits 18   Date for PT Re-Evaluation 11/29/15   Authorization Type [employer] Gold Plus Medicare (Gcode done 9th session)   Authorization Time Period 10/18/15 to 11/29/15   Authorization - Visit Number 10   Authorization - Number of Visits 19   PT Start Time 1301   PT Stop Time 1355   PT Time Calculation (min) 54 min   Equipment Utilized During Treatment Gait belt   Activity Tolerance Patient tolerated treatment well   Behavior During Therapy [facility] for tasks assessed/performed      Past Medical History:  Diagnosis Date  . Arthritis   . Coronary artery disease    Multivessel s/p CABG [facility] at [facility]  . Essential hypertension   . Hematuria   . High cholesterol   . History of blood transfusion 07/2015   Knee replacement HgB <9  . History of pneumonia   . History of stroke   . Myocardial infarction ([place]) 1980  . Peripheral vascular disease ([place])   . Polio osteopathy of lower leg ([place]) Age 81   Affected right leg  . Skin cancer   . TIA (transient ischemic attack) 1989   Chronic coumadin  . UTI (lower urinary tract infection)     Past Surgical History:  Procedure Laterality Date  . CARDIAC CATHETERIZATION N/A 10/05/2014   Procedure: Left Heart Cath and Coronary Angiography;  Surgeon: Peter M Martinique, MD;  Location: [facility];  Service: Cardiovascular;  Laterality: N/A;  . CORONARY ARTERY BYPASS GRAFT  1981   [facility]  . CORONARY ARTERY BYPASS GRAFT N/A 10/08/2014   Procedure: REDO CORONARY ARTERY BYPASS GRAFTING (CABG) Times Two Grafts using Left Internal Mammary and Left Radial Artery;   Surgeon: Melrose Nakayama, MD;  Location: [facility];  Service: Open Heart Surgery;  Laterality: N/A;  . CYSTOSCOPY WITH INJECTION  10/10/2010   Procedure: CYSTOSCOPY WITH INJECTION;  Surgeon: Marissa Nestle;  Location: [facility];  Service: Urology;  Laterality: N/A;  with retrograde urethrogram  . FOOT SURGERY Right 1954   [facility], muscle implantation  . JOINT REPLACEMENT    . RADIAL ARTERY HARVEST Left 10/08/2014   Procedure: Left RADIAL ARTERY HARVEST;  Surgeon: Melrose Nakayama, MD;  Location: [facility];  Service: Open Heart Surgery;  Laterality: Left;  . TEE WITHOUT CARDIOVERSION N/A 10/08/2014   Procedure: TRANSESOPHAGEAL ECHOCARDIOGRAM (TEE);  Surgeon: Melrose Nakayama, MD;  Location: [facility];  Service: Open Heart Surgery;  Laterality: N/A;  . TONSILLECTOMY  1949   [facility]  . TOTAL KNEE ARTHROPLASTY Left 07/27/2015   Procedure: TOTAL LEFT KNEE ARTHROPLASTY;  Surgeon: [name], MD;  Location: [facility];  Service: Orthopedics;  Laterality: Left;    There were no vitals filed for this visit.      Subjective Assessment - 11/11/15 1302    Subjective Pt stated he is feeling good today.  Reports he tried to get AFO at a store in [place] but they are closed.  Has apt with [facility] in [place] on 11/22/15 for AFO   Pertinent History chronic warfarin use, on beta blockers, extensive  cardiac history including CAD/PVD/multiple CABG procedures/HTN/etc, hx respiratory failure, OA, coagulopathy, MI, TIA, CVA, polio, history of R foot surgery, HOH    Patient Stated Goals get stronger, and get his walking better    Currently in Pain? No/denies               [facility] Adult PT Treatment/Exercise - 11/11/15 0001      Knee/Hip Exercises: Aerobic   Nustep 8 minutes L5, UE/LE, seat 9      Knee/Hip Exercises: Standing   Lateral Step Up Left;Step Height: 6";15 reps;Hand Hold: 1   Lateral Step Up Limitations Rt 4", Lt 6"   Forward Step Up Right;Step Height: 4";Left;Step Height: 6";Both;15 reps   Forward  Step Up Limitations Rt 4", Lt 6"   Step Down Both;15 reps;Step Height: 4";Hand Hold: 1   Functional Squat 20 reps   Functional Squat Limitations heavy use of UE for momenturm; tactil   Stairs reciprocal pattern 4' and 7" heights   SLS with Vectors Rt LE with 1 HHA taking into flexion only 5X5" holds   Other Standing Knee Exercises side stepping 2Rt   Other Standing Knee Exercises tandem gait 2RT                  PT Short Term Goals - 11/09/15 1442      PT SHORT TERM GOAL #1   Title Patient will demonstrate ROM L knee 0-120 degrees in order to improve gait and overall mobility    Baseline 11/09/2015:  AROM 1-112 degrees   Status On-going     PT SHORT TERM GOAL #2   Title Patient to be able to ambulate with LRAD with correct gait mechanics and minimal unsteadiness to demonstrate improvement of condition    Baseline 11/09/2015:  Ambulates with 1 crutch to assist wtih Rt foot drop     PT SHORT TERM GOAL #3   Title Patient to be able to tolerate 5-10 minutes of activity without SOB/DOE in order to demonstrate improved strength and functional activity tolerance    Baseline 11/09/2015:      PT SHORT TERM GOAL #4   Title Patient to be correctly and consistently performing appropriate HEP, to be updated PRN    Baseline 11/09/2015:  Reports compliance with HEP at least 4 days per week   Status Achieved           PT Long Term Goals - 11/09/15 1444      PT LONG TERM GOAL #1   Title Patient to demonstrate strength in all tested muscles 4/5 R LE and 5/5 L LE in order to improve function and gait      PT LONG TERM GOAL #2   Title Patient to be able to ambulate at least house hold distances with no device and no DOE/SOB in order to demonstrate improved function and tolerance to activity      PT LONG TERM GOAL #3   Title Patient to report he has been able to return to gym for low intensity activities in short intervals with no DOE/SOB to demonstrate improvement in mobilty and  function      PT LONG TERM GOAL #4   Title Patient to be able to reciprocally ascend/descent full flight of stairs with U railing, step over step pattern, good eccentric control, and minimal unsteadiness in order to improve mobility within community                Plan - 11/11/15 1504  Clinical Impression Statement Pt reports he has schedule with [employer] on 11/22/2015 for AFO Rt foot to assist with gait.  Session focus on improving functional strengtheing, education on importance of safety with sit to stand transfers and added balance training.  Pt continues to demonstrate weakness noted especially with need for HHA with stair training this session.  Therapist facilitation required through session to improve form and technique with therex and cueing for posture through session.  Pt continues to demonstrate poor safety awareness with stand<>sit, educated on importance of fully lining up to chair prior sitting.  Reviewed HEP, pt given additional exercises to address hip and functional weakness.  No reports of pain through session.   Rehab Potential Good   Clinical Impairments Affecting Rehab Potential extensive cardiac history with severe cardiopulmonary limitations, history of polio affecting R LE.    PT Frequency 3x / week   PT Duration 6 weeks   PT Treatment/Interventions ADLs/Self Care Home Management;Cryotherapy;Moist Heat;DME Instruction;Gait training;Stair training;Functional mobility training;Therapeutic activities;Therapeutic exercise;Balance training;Neuromuscular re-education;Patient/family education;Manual techniques;Scar mobilization;Passive range of motion;Energy conservation;Taping   PT Next Visit Plan Progression of gait with cane and with no AD, safety with transfers sit<>stand, and progression of closed chained activities.    PT Home Exercise Plan additional exercises including sit to stand and sidelying abduction.      Patient will benefit from skilled therapeutic  intervention in order to improve the following deficits and impairments:  Abnormal gait, Decreased endurance, Hypomobility, Decreased scar mobility, Decreased activity tolerance, Decreased strength, Pain, Decreased balance, Decreased mobility, Difficulty walking, Decreased range of motion, Improper body mechanics, Decreased coordination, Impaired flexibility, Postural dysfunction, Decreased knowledge of precautions  Visit Diagnosis: Stiffness of left knee, not elsewhere classified  Localized edema  Difficulty in walking, not elsewhere classified  Muscle weakness (generalized)  Unsteadiness on feet     Problem List Patient Active Problem List   Diagnosis Date Noted  . Subtherapeutic anticoagulation   . Acute on chronic respiratory failure with hypoxia and hypercapnia (HCC)   . Acute on chronic combined systolic and diastolic CHF (congestive heart failure) ([place])   . LV (left ventricular) mural thrombus ([place]) 09/03/2015  . Systolic and diastolic CHF, acute on chronic ([place])   . Hypokalemia   . CHF (congestive heart failure) ([place]) 09/01/2015  . Respiratory failure with hypoxia ([place]) 08/31/2015  . HCAP (healthcare-associated pneumonia) 08/31/2015  . Acute respiratory failure with hypoxia ([place])   . Encounter for central line placement   . Knee swelling   . Subtherapeutic international normalized ratio (INR) 08/08/2015  . Constipation 08/08/2015  . Hyperlipidemia 08/08/2015  . Blurred vision 07/29/2015  . Anemia 07/29/2015  . S/P total knee replacement 07/27/2015  . S/P CABG x 2 10/08/2014  . Chest pain 10/01/2014  . Elevated INR 10/01/2014  . Leukocytosis 10/01/2014  . Thrombocytopenia ([place]) 10/01/2014  . Chest pain at rest 10/01/2014  . Unstable angina ([place]) 10/01/2014  . Coagulopathy ([place]) 10/01/2014  . Paroxysmal atrial fibrillation ([place]) 10/01/2014  . Coronary artery disease   . Hypertension   . Arthritis   . Peripheral vascular disease ([place])   . Essential hypertension     Ihor Austin, LPTA; [place]  Aldona Lento 11/11/2015, 4:02 PM  [facility] [address] [place], [place], [address] Phone: [phone]   Fax:  [phone]  Name: GRADEN SANDMEYER MRN: [id number] Date of Birth: [birth year]

## 2015-11-14 ENCOUNTER — Ambulatory Visit (HOSPITAL_COMMUNITY): Payer: Commercial Managed Care - HMO | Admitting: Physical Therapy

## 2015-11-14 DIAGNOSIS — R2681 Unsteadiness on feet: Secondary | ICD-10-CM

## 2015-11-14 DIAGNOSIS — M25662 Stiffness of left knee, not elsewhere classified: Secondary | ICD-10-CM | POA: Diagnosis not present

## 2015-11-14 DIAGNOSIS — M6281 Muscle weakness (generalized): Secondary | ICD-10-CM

## 2015-11-14 DIAGNOSIS — R600 Localized edema: Secondary | ICD-10-CM

## 2015-11-14 DIAGNOSIS — R262 Difficulty in walking, not elsewhere classified: Secondary | ICD-10-CM

## 2015-11-14 NOTE — Therapy (Signed)
[facility] [address] [place], [place], [address] Phone: [phone]   Fax:  [phone]  Physical Therapy Treatment (Discharge)  Patient Details  Name: Benjamin Cordova MRN: [id number] Date of Birth: [birth year] Referring Provider: Kathryne Hitch  Encounter Date: 11/14/2015      PT End of Session - 11/14/15 1354    Visit Number 11   Number of Visits 11   Authorization Type [employer] Gold Plus Medicare (Gcode done 9th session)   Authorization Time Period 10/18/15 to 11/29/15   Authorization - Visit Number 11   Authorization - Number of Visits 19   PT Start Time 6546   PT Stop Time 1350   PT Time Calculation (min) 45 min   Activity Tolerance Patient tolerated treatment well   Behavior During Therapy [facility] for tasks assessed/performed      Past Medical History:  Diagnosis Date  . Arthritis   . Coronary artery disease    Multivessel s/p CABG 1981 at [facility]  . Essential hypertension   . Hematuria   . High cholesterol   . History of blood transfusion 07/2015   Knee replacement HgB <9  . History of pneumonia   . History of stroke   . Myocardial infarction ([place]) 1980  . Peripheral vascular disease ([place])   . Polio osteopathy of lower leg ([place]) Age 50   Affected right leg  . Skin cancer   . TIA (transient ischemic attack) 1989   Chronic coumadin  . UTI (lower urinary tract infection)     Past Surgical History:  Procedure Laterality Date  . CARDIAC CATHETERIZATION N/A 10/05/2014   Procedure: Left Heart Cath and Coronary Angiography;  Surgeon: Peter M Martinique, MD;  Location: [facility];  Service: Cardiovascular;  Laterality: N/A;  . CORONARY ARTERY BYPASS GRAFT  1981   [facility]  . CORONARY ARTERY BYPASS GRAFT N/A 10/08/2014   Procedure: REDO CORONARY ARTERY BYPASS GRAFTING (CABG) Times Two Grafts using Left Internal Mammary and Left Radial Artery;  Surgeon: Melrose Nakayama, MD;  Location: [facility];  Service: Open Heart  Surgery;  Laterality: N/A;  . CYSTOSCOPY WITH INJECTION  10/10/2010   Procedure: CYSTOSCOPY WITH INJECTION;  Surgeon: Marissa Nestle;  Location: [facility];  Service: Urology;  Laterality: N/A;  with retrograde urethrogram  . FOOT SURGERY Right 1954   [facility], muscle implantation  . JOINT REPLACEMENT    . RADIAL ARTERY HARVEST Left 10/08/2014   Procedure: Left RADIAL ARTERY HARVEST;  Surgeon: Melrose Nakayama, MD;  Location: [facility];  Service: Open Heart Surgery;  Laterality: Left;  . TEE WITHOUT CARDIOVERSION N/A 10/08/2014   Procedure: TRANSESOPHAGEAL ECHOCARDIOGRAM (TEE);  Surgeon: Melrose Nakayama, MD;  Location: [facility];  Service: Open Heart Surgery;  Laterality: N/A;  . TONSILLECTOMY  1949   [facility]  . TOTAL KNEE ARTHROPLASTY Left 07/27/2015   Procedure: TOTAL LEFT KNEE ARTHROPLASTY;  Surgeon: [name], MD;  Location: [facility];  Service: Orthopedics;  Laterality: Left;    There were no vitals filed for this visit.      Subjective Assessment - 11/14/15 1310    Subjective Patient arrives reporting he is feeling well today, right now his only real remaining problems are more due to his R LE than his L LE. He has an appointment scheduled to get another AFO. No falls or close calls recently. He rates himself as being 75/100, with the last 25% being due to problems with his R LE.  Patient reports no major changes or problems otherwise, he is able to do what he needs and wants in terms of his L LE with no problems.    Pertinent History chronic warfarin use, on beta blockers, extensive cardiac history including CAD/PVD/multiple CABG procedures/HTN/etc, hx respiratory failure, OA, coagulopathy, MI, TIA, CVA, polio, history of R foot surgery, HOH    Patient Stated Goals get stronger, and get his walking better    Currently in Pain? No/denies                         [facility] Adult PT Treatment/Exercise - 11/14/15 0001      Knee/Hip Exercises: Aerobic   Nustep 8 minutes L2, UE/LE seat  10  performed while PT designing/finalizing advanced/final HEP      Knee/Hip Exercises: Seated   Sit to Sand 10 reps;Other (comment);with UE support  staggered stance      Knee/Hip Exercises: Supine   Bridges Both;1 set;15 reps   Bridges Limitations staggered bridge    Other Supine Knee/Hip Exercises supine hip ABD with red TB 1x15 each      Knee/Hip Exercises: Prone   Hip Extension Strengthening;Both;1 set;5 reps;Other (comment)  knee bent              Balance Exercises - 11/14/15 1353      Balance Exercises: Standing   Standing Eyes Opened Narrow base of support (BOS);1 rep;30 secs   Tandem Stance Eyes open;2 reps;10 secs           PT Education - 11/14/15 1353    Education provided Yes   Education Details DC today due to patient being able to do all he needs/wants in terms of L LE, his main problem is R LE due to post-polio syndrome; advanced HEP; local walking club    Person(s) Educated Patient   Methods Explanation;Demonstration;Handout   Comprehension Verbalized understanding;Returned demonstration          PT Short Term Goals - 11/09/15 1442      PT SHORT TERM GOAL #1   Title Patient will demonstrate ROM L knee 0-120 degrees in order to improve gait and overall mobility    Baseline 11/09/2015:  AROM 1-112 degrees   Status On-going     PT SHORT TERM GOAL #2   Title Patient to be able to ambulate with LRAD with correct gait mechanics and minimal unsteadiness to demonstrate improvement of condition    Baseline 11/09/2015:  Ambulates with 1 crutch to assist wtih Rt foot drop     PT SHORT TERM GOAL #3   Title Patient to be able to tolerate 5-10 minutes of activity without SOB/DOE in order to demonstrate improved strength and functional activity tolerance    Baseline 11/09/2015:      PT SHORT TERM GOAL #4   Title Patient to be correctly and consistently performing appropriate HEP, to be updated PRN    Baseline 11/09/2015:  Reports compliance with HEP at  least 4 days per week   Status Achieved           PT Long Term Goals - 11/09/15 1444      PT LONG TERM GOAL #1   Title Patient to demonstrate strength in all tested muscles 4/5 R LE and 5/5 L LE in order to improve function and gait      PT LONG TERM GOAL #2   Title Patient to be able to ambulate at least house hold distances with no  device and no DOE/SOB in order to demonstrate improved function and tolerance to activity      PT LONG TERM GOAL #3   Title Patient to report he has been able to return to gym for low intensity activities in short intervals with no DOE/SOB to demonstrate improvement in mobilty and function      PT LONG TERM GOAL #4   Title Patient to be able to reciprocally ascend/descent full flight of stairs with U railing, step over step pattern, good eccentric control, and minimal unsteadiness in order to improve mobility within community                Plan - 2015-12-01 1355    Clinical Impression Statement Patient arrives stating he is very pleased with his L knee, he really has no problems with it and he is able to do what he wants- his main problem is his R LE due to post-polio syndrome, and he reports this is really his last remaining deficit. Educated patient that due to resolution of L LE problems/condition, DC today as this was primary target of MD order. Assigned appropriate advanced HEP, correcting form, and finalized while patient was exercising on Nustep this session. Continued to encourage patient to keep up with advanced HEP and encouraged to get to [facility] for activity on Nustep, bicycle, walking in pool, etc. Patient educated about walking club and regular walking times. Patient agreeable to DC today due to high level of function in terms of surgical LE.    Rehab Potential Good   Clinical Impairments Affecting Rehab Potential extensive cardiac history with severe cardiopulmonary limitations, history of polio affecting R LE.    PT Next Visit Plan DC today  due to high level of function    Consulted and Agree with Plan of Care Patient      Patient will benefit from skilled therapeutic intervention in order to improve the following deficits and impairments:  Abnormal gait, Decreased endurance, Hypomobility, Decreased scar mobility, Decreased activity tolerance, Decreased strength, Pain, Decreased balance, Decreased mobility, Difficulty walking, Decreased range of motion, Improper body mechanics, Decreased coordination, Impaired flexibility, Postural dysfunction, Decreased knowledge of precautions  Visit Diagnosis: Stiffness of left knee, not elsewhere classified  Localized edema  Difficulty in walking, not elsewhere classified  Muscle weakness (generalized)  Unsteadiness on feet       G-Codes - December 01, 2015 1358    Functional Assessment Tool Used Based on skilled clinical assessment of strength, gait, balance, ROM, cardiopulmonary endurance, and FOTO   Functional Limitation Mobility: Walking and moving around   Mobility: Walking and Moving Around Goal Status [phone]) At least 20 percent but less than 40 percent impaired, limited or restricted   Mobility: Walking and Moving Around Discharge Status [phone]) At least 40 percent but less than 60 percent impaired, limited or restricted      Problem List Patient Active Problem List   Diagnosis Date Noted  . Subtherapeutic anticoagulation   . Acute on chronic respiratory failure with hypoxia and hypercapnia (HCC)   . Acute on chronic combined systolic and diastolic CHF (congestive heart failure) ([place])   . LV (left ventricular) mural thrombus ([place]) 09/03/2015  . Systolic and diastolic CHF, acute on chronic ([place])   . Hypokalemia   . CHF (congestive heart failure) ([place]) 09/01/2015  . Respiratory failure with hypoxia ([place]) 08/31/2015  . HCAP (healthcare-associated pneumonia) 08/31/2015  . Acute respiratory failure with hypoxia ([place])   . Encounter for central line placement   . Knee swelling    .  Subtherapeutic international normalized ratio (INR) 08/08/2015  . Constipation 08/08/2015  . Hyperlipidemia 08/08/2015  . Blurred vision 07/29/2015  . Anemia 07/29/2015  . S/P total knee replacement 07/27/2015  . S/P CABG x 2 10/08/2014  . Chest pain 10/01/2014  . Elevated INR 10/01/2014  . Leukocytosis 10/01/2014  . Thrombocytopenia ([place]) 10/01/2014  . Chest pain at rest 10/01/2014  . Unstable angina ([place]) 10/01/2014  . Coagulopathy ([place]) 10/01/2014  . Paroxysmal atrial fibrillation ([place]) 10/01/2014  . Coronary artery disease   . Hypertension   . Arthritis   . Peripheral vascular disease ([place])   . Essential hypertension     PHYSICAL THERAPY DISCHARGE SUMMARY  Visits from Start of Care: 11  Current functional level related to goals / functional outcomes: Patient reports his remaining functional limitations are all due to his R LE, secondary to post-polio syndrome; he states he has no concerns or problems regarding his L LE as he is able to do what he needs and wants to do in terms of this. Patient agreeable to DC today due to high level of function.    Remaining deficits: Weakness throughout R LE due to post-polio syndrome, gait impairment, unsteadiness    Education / Equipment: Advanced HEP, encouraged activity at [facility] and pool walking, local walking club/regular meeting times; attempted safety education for functional mobility but patient refused, saying "I'm almost 81 years old, I've got it figured out and I'm not gonna fall". DC today.  Plan: Patient agrees to discharge.  Patient goals were partially met. Patient is being discharged due to being pleased with the current functional level.  ?????        Deniece Ree PT, DPT [facility] [address] [place], [place], [address] Phone: [phone]   Fax:  [phone]  Name: Benjamin Cordova MRN: [id number] Date of Birth: 29-Sep-1934

## 2015-11-14 NOTE — Patient Instructions (Signed)
   BRIDGING  While lying on your back, tighten your lower abdominals, squeeze your buttocks and then raise your buttocks off the floor/bed as creating a "Bridge" with your body. Put one foot closer to your body, and one foot further away. Raise your hips up.  Repeat 15 times, then switch your feet and repeat another 15 times, 1-2 times per day.    ELASTIC BAND - SUPINE HIP ABDUCTION  While lying on your back, slowly bring your leg out to the side. Keep  your knee straight the entire time, it should feel like you are trying to turn your toes inward rather than out.  Repeat 15 times each side, 1-2 times a day.  Knee Extension: Sit to Stand (Eccentric)    Stand close to chair with one foot under you and the other out front. Slowly lower self to seated position. _5__ reps each side per set, __1-2_ sets per day, _5__ days per week. Progress to stopping midway before lowering to chair. Progress to barely touching chair.  Copyright  VHI. All rights reserved.     PRONE HIP EXTENSION - BENT  While lying face down with your knee bent, slowly raise up your knee off the ground.  Repeat 5 times each side, 1-2 times per day.   NARROW BASE OF SUPPORT        Standing near a counter with a narrow base of support, with your feet as close together as you can get, try to maintain this stance for as long as you can.  Repeat 3 times, 1-2 times per day.     TANDEM STANCE WITH SUPPORT  Stand in front of a chair, table or counter top for support. Then place the heel of one foot so that it is touching the toes of the other foot. Maintain your balance in this position.   Hold for as long as you can, then switch your feet. Repeat 3 times each side, twice a day.

## 2015-11-16 ENCOUNTER — Encounter (HOSPITAL_COMMUNITY): Payer: Commercial Managed Care - HMO | Admitting: Physical Therapy

## 2015-11-18 ENCOUNTER — Ambulatory Visit (HOSPITAL_COMMUNITY): Payer: Commercial Managed Care - HMO

## 2015-11-18 ENCOUNTER — Encounter (HOSPITAL_COMMUNITY): Payer: Commercial Managed Care - HMO

## 2015-11-21 ENCOUNTER — Encounter (HOSPITAL_COMMUNITY): Payer: Commercial Managed Care - HMO | Admitting: Physical Therapy

## 2015-11-23 ENCOUNTER — Encounter (HOSPITAL_COMMUNITY): Payer: Commercial Managed Care - HMO | Admitting: Physical Therapy

## 2015-11-23 ENCOUNTER — Ambulatory Visit: Payer: Commercial Managed Care - HMO | Admitting: Cardiology

## 2015-11-25 ENCOUNTER — Encounter (HOSPITAL_COMMUNITY): Payer: Commercial Managed Care - HMO | Admitting: Physical Therapy

## 2015-11-25 DIAGNOSIS — I482 Chronic atrial fibrillation: Secondary | ICD-10-CM | POA: Diagnosis not present

## 2015-11-25 DIAGNOSIS — Z7901 Long term (current) use of anticoagulants: Secondary | ICD-10-CM | POA: Diagnosis not present

## 2015-11-28 ENCOUNTER — Ambulatory Visit (HOSPITAL_COMMUNITY): Payer: Commercial Managed Care - HMO | Admitting: Physical Therapy

## 2015-11-29 ENCOUNTER — Ambulatory Visit (HOSPITAL_COMMUNITY)
Admission: RE | Admit: 2015-11-29 | Discharge: 2015-11-29 | Disposition: A | Discharge status: Home / Self Care | Payer: Commercial Managed Care - HMO | Source: Ambulatory Visit | Attending: Cardiology | Admitting: Cardiology

## 2015-11-29 DIAGNOSIS — I429 Cardiomyopathy, unspecified: Secondary | ICD-10-CM | POA: Insufficient documentation

## 2015-11-29 DIAGNOSIS — I509 Heart failure, unspecified: Secondary | ICD-10-CM | POA: Insufficient documentation

## 2015-11-29 DIAGNOSIS — I2510 Atherosclerotic heart disease of native coronary artery without angina pectoris: Secondary | ICD-10-CM | POA: Insufficient documentation

## 2015-11-29 DIAGNOSIS — I110 Hypertensive heart disease with heart failure: Secondary | ICD-10-CM | POA: Insufficient documentation

## 2015-11-29 DIAGNOSIS — I340 Nonrheumatic mitral (valve) insufficiency: Secondary | ICD-10-CM | POA: Insufficient documentation

## 2015-11-29 DIAGNOSIS — Z951 Presence of aortocoronary bypass graft: Secondary | ICD-10-CM | POA: Diagnosis not present

## 2015-11-29 DIAGNOSIS — I480 Paroxysmal atrial fibrillation: Secondary | ICD-10-CM | POA: Insufficient documentation

## 2015-11-29 DIAGNOSIS — E785 Hyperlipidemia, unspecified: Secondary | ICD-10-CM | POA: Insufficient documentation

## 2015-11-29 NOTE — Progress Notes (Signed)
*  PRELIMINARY RESULTS* Echocardiogram 2D Echocardiogram has been performed.  Benjamin Cordova 11/29/2015, 10:23 AM

## 2015-12-02 ENCOUNTER — Encounter: Payer: Self-pay | Admitting: Cardiology

## 2015-12-02 ENCOUNTER — Ambulatory Visit (INDEPENDENT_AMBULATORY_CARE_PROVIDER_SITE_OTHER): Payer: Commercial Managed Care - HMO | Admitting: Cardiology

## 2015-12-02 VITALS — BP 124/68 | HR 66 | Ht 68.0 in | Wt 233.0 lb

## 2015-12-02 DIAGNOSIS — I255 Ischemic cardiomyopathy: Secondary | ICD-10-CM | POA: Diagnosis not present

## 2015-12-02 DIAGNOSIS — I2510 Atherosclerotic heart disease of native coronary artery without angina pectoris: Secondary | ICD-10-CM | POA: Diagnosis not present

## 2015-12-02 MED ORDER — CARVEDILOL 12.5 MG PO TABS: 12.5000 mg | ORAL_TABLET | Freq: Two times a day (BID) | ORAL | 6 refills | Status: DC

## 2015-12-02 MED ORDER — CARVEDILOL 12.5 MG PO TABS: 12.5000 mg | ORAL_TABLET | Freq: Two times a day (BID) | ORAL | 3 refills | Status: DC

## 2015-12-02 NOTE — Patient Instructions (Addendum)
Medication Instructions:   Stop Atenolol.  Begin Coreg 12.5mg  twice a day.  Continue all other medications.    Labwork: none  Testing/Procedures: none  Follow-Up: 3 months  Any Other Special Instructions Will Be Listed Below (If Applicable).  If you need a refill on your cardiac medications before your next appointment, please call your pharmacy.

## 2015-12-02 NOTE — Progress Notes (Signed)
Cardiology Office Note  Date: 12/02/2015   ID: Benjamin Cordova, DOB May 31, 1934, MRN [id number]  PCP: Benjamin Neighbors, MD  Primary Cardiologist: Benjamin Lesches, MD   Chief Complaint  Patient presents with  . Coronary Artery Disease  . Cardiomyopathy    History of Present Illness: Benjamin Cordova is a 81 y.o. male last seen in June. He presents for a follow-up visit. States that his mobility has been improving, he still uses a cane or cry 20 gets out in unfamiliar territory. He is trying to build up the strength in his legs. He does not report any angina symptoms at this time.  Recent follow-up echocardiogram is outlined below, LVEF 35-40% range. I reviewed his medications. We plan to change from atenolol to Coreg. He continues on lisinopril and HCTZ.  He continues on Coumadin, followed by Dr. Nevada Cordova. No recent falls or reported bleeding problems.  Past Medical History:  Diagnosis Date  . Arthritis   . Coronary artery disease    Multivessel s/p CABG [facility] at [facility]  . Essential hypertension   . Hematuria   . High cholesterol   . History of blood transfusion 07/2015   Knee replacement HgB <9  . History of pneumonia   . History of stroke   . Myocardial infarction ([place]) 1980  . Peripheral vascular disease ([place])   . 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 Skin cancer   . TIA (transient ischemic attack) 1989   Chronic coumadin  . UTI (lower urinary tract infection)     Past Surgical History:  Procedure Laterality Date  . CARDIAC CATHETERIZATION N/A 10/05/2014   Procedure: Left Heart Cath and Coronary Angiography;  Surgeon: Benjamin M Martinique, MD;  Location: [place] [facility];  Service: Cardiovascular;  Laterality: N/A;  . CORONARY ARTERY BYPASS GRAFT  1981   [facility]  . CORONARY ARTERY BYPASS GRAFT N/A 10/08/2014   Procedure: REDO CORONARY ARTERY BYPASS GRAFTING (CABG) Times Two Grafts using Left Internal Mammary and Left Radial Artery;  Surgeon: Benjamin Nakayama, MD;  Location: [place];  Service: Open Heart Surgery;  Laterality: N/A;  . CYSTOSCOPY WITH INJECTION  10/10/2010   Procedure: CYSTOSCOPY WITH INJECTION;  Surgeon: Benjamin Cordova;  [facility];  Service: Urology;  Laterality: N/A;  with retrograde urethrogram  . FOOT SURGERY Right 1954   [facility], muscle implantation  . JOINT REPLACEMENT    . RADIAL ARTERY HARVEST Left 10/08/2014   Procedure: Left RADIAL ARTERY HARVEST;  Surgeon: Benjamin Nakayama, MD;  Location: [place];  Service: Open Heart Surgery;  Laterality: Left;  . TEE WITHOUT CARDIOVERSION N/A 10/08/2014   Procedure: TRANSESOPHAGEAL ECHOCARDIOGRAM (TEE);  Surgeon: Benjamin Nakayama, MD;  Location: [place];  Service: Open Heart Surgery;  Laterality: N/A;  . TONSILLECTOMY  1949   [facility]  . TOTAL KNEE ARTHROPLASTY Left 07/27/2015   Procedure: TOTAL LEFT KNEE ARTHROPLASTY;  Surgeon: Benjamin Lights, MD;  Location: [place];  Service: Orthopedics;  Laterality: Left;    Current Outpatient Prescriptions  Medication Sig Dispense Refill  . atenolol (TENORMIN) 50 MG tablet     . B Complex-C-Folic Acid (SUPER B-COMPLEX/VIT C/FA) TABS Take 1 tablet by mouth daily.      . diphenhydrAMINE (SOMINEX) 25 MG tablet Take 25 mg by mouth at bedtime as needed for sleep.    . folic acid (FOLVITE) [id number] MCG tablet Take 400 mcg by mouth daily.     Marland Kitchen  hydrochlorothiazide (HYDRODIURIL) 25 MG tablet Take 25 mg by mouth daily.    Marland Kitchen lisinopril (PRINIVIL,ZESTRIL) 10 MG tablet Take 0.5 tablets (5 mg total) by mouth daily. 30 tablet 0  . magnesium oxide (MAG-OX) 400 MG tablet Take 250 mg by mouth at bedtime.     . Misc Natural Products (URINOZINC PLUS PO) Take 1 tablet by mouth daily.    . Multiple Vitamins-Minerals (CERTAVITE/ANTIOXIDANTS) TABS Take 1 tablet by mouth daily.     Marland Kitchen oxyCODONE-acetaminophen (ROXICET) 5-325 MG tablet Take 1-2 tablets by mouth every 4 (four) hours as needed. 60 tablet 0  . polyethylene glycol (MIRALAX / GLYCOLAX) packet Take 17 g by  mouth daily. 14 each 0  . senna (SENOKOT) 8.6 MG tablet Take 1 tablet by mouth 2 (two) times daily.     . simvastatin (ZOCOR) 40 MG tablet Take 40 mg by mouth daily.    . vitamin C (ASCORBIC ACID) 500 MG tablet Take 500 mg by mouth daily.      Marland Kitchen warfarin (COUMADIN) 5 MG tablet Take 1.5 tablets (7.5 mg total) by mouth daily at 6 PM. OR AS DIRECTED by MD. 60 tablet 0   No current facility-administered medications for this visit.    Allergies:  Review of patient's allergies indicates no known allergies.   Social History: The patient  reports that he quit smoking about 37 years ago. His smoking use included Cigarettes. He has a 28.00 pack-year smoking history. He has never used smokeless tobacco. He reports that he does not drink alcohol or use drugs.   ROS:  Please see the history of present illness. Otherwise, complete review of systems is positive for knee stiffness.  All other systems are reviewed and negative.   Physical Exam: VS:  BP 124/68   Pulse 66   Ht 5\' 8"  (1.727 m)   Wt 233 lb (105.7 kg)   SpO2 93%   BMI 35.43 kg/m , BMI Body mass index is 35.43 kg/m.  Wt Readings from Last 3 Encounters:  12/02/15 233 lb (105.7 kg)  09/23/15 226 lb (102.5 kg)  09/10/15 225 lb (102.1 kg)    General: Overweight male, appears comfortable at rest. HEENT: Conjunctiva and lids normal, oropharynx clear. Neck: Supple, no elevated JVP or carotid bruits, no thyromegaly. Lungs: Clear to auscultation, nonlabored breathing at rest. Thorax: Well-healed sternal incision. Cardiac: Regular rate and rhythm with ectopy, no S3, soft systolic murmur, no pericardial rub. Abdomen: Soft, nontender, bowel sounds present. Extremities: Trace leg edema, distal pulses 2+.  ECG: I personally reviewed the tracing from 08/31/2015 which showed sinus tachycardia with PVC.  Recent Labwork: 08/31/2015: B Natriuretic Peptide 349.0 09/01/2015: Magnesium 2.0 09/02/2015: ALT 16; AST 22; TSH 1.363 09/06/2015: BUN 17; Creatinine,  Ser 0.83; Hemoglobin 10.0; Platelets 164; Potassium 3.8; Sodium 138     Component Value Date/Time   CHOL 118 (L) 03/05/2015 0834   TRIG 104 03/05/2015 0834   HDL 39 (L) 03/05/2015 0834   CHOLHDL 3.0 03/05/2015 0834   VLDL 21 03/05/2015 0834   LDLCALC 58 03/05/2015 0834    Other Studies Reviewed Today:  Carlton Adam Myoview 09/03/2015: FINDINGS: Perfusion: There is a large fixed defect at the apex extending into the anterior and inferior walls compatible with old infarct/scar. Areas of decreased perfusion on the stress images in the adjacent anterior wall and lower septum concerning for peri-infarct ischemia.  Wall Motion: Severe diffuse hypokinesia with akinesia in the apex, anterior and inferior walls. Dilated left ventricle.  Left Ventricular Ejection Fraction:  26 %  End diastolic volume 123456 ml  End systolic volume 123XX123 ml  IMPRESSION: 1. Large fixed defect at the apex extending into the anterior and inferior walls. Concern for peri-infarct ischemia in the anterior wall and lower septum.  2. Severe diffuse hypokinesia with akinesia in the areas of scar.  3. Left ventricular ejection fraction 26%  4. Non invasive risk stratification*: High risk  Echocardiogram 11/29/2015: Study Conclusions  - Left ventricle: Systolic function was moderately reduced. The   estimated ejection fraction was in the range of 35% to 40%. Left   ventricular diastolic function parameters were normal. - Mitral valve: There was mild regurgitation. - Left atrium: The atrium was moderately dilated. - Atrial septum: No defect or patent foramen ovale was identified.  Assessment and Plan:  1. Multivessel disease status post remote CABG and redo operation in July 2016. He has an associated ischemic cardiomyopathy with LVEF most recently in the 35-40% range. He has been symptomatically stable. Will plan to switch from atenolol to Coreg.  2. Essential hypertension on blood pressure is well  controlled today.  Current medicines were reviewed with the patient today.  Disposition: Follow-up with me in 3 months.   Signed, Satira Sark, MD, [facility] 12/02/2015 3:36 PM    [facility] [place], [place], [place] [address] Phone: [phone]; Fax: [phone]

## 2015-12-09 DIAGNOSIS — E782 Mixed hyperlipidemia: Secondary | ICD-10-CM | POA: Diagnosis not present

## 2015-12-09 DIAGNOSIS — Z125 Encounter for screening for malignant neoplasm of prostate: Secondary | ICD-10-CM | POA: Diagnosis not present

## 2015-12-09 DIAGNOSIS — R7301 Impaired fasting glucose: Secondary | ICD-10-CM | POA: Diagnosis not present

## 2015-12-13 DIAGNOSIS — I519 Heart disease, unspecified: Secondary | ICD-10-CM | POA: Diagnosis not present

## 2015-12-13 DIAGNOSIS — I10 Essential (primary) hypertension: Secondary | ICD-10-CM | POA: Diagnosis not present

## 2015-12-13 DIAGNOSIS — I639 Cerebral infarction, unspecified: Secondary | ICD-10-CM | POA: Diagnosis not present

## 2015-12-13 DIAGNOSIS — E782 Mixed hyperlipidemia: Secondary | ICD-10-CM | POA: Diagnosis not present

## 2015-12-23 DIAGNOSIS — I482 Chronic atrial fibrillation: Secondary | ICD-10-CM | POA: Diagnosis not present

## 2015-12-23 DIAGNOSIS — Z23 Encounter for immunization: Secondary | ICD-10-CM | POA: Diagnosis not present

## 2015-12-23 DIAGNOSIS — Z7901 Long term (current) use of anticoagulants: Secondary | ICD-10-CM | POA: Diagnosis not present

## 2016-01-05 DIAGNOSIS — M21371 Foot drop, right foot: Secondary | ICD-10-CM | POA: Diagnosis not present

## 2016-01-17 DIAGNOSIS — M1712 Unilateral primary osteoarthritis, left knee: Secondary | ICD-10-CM | POA: Diagnosis not present

## 2016-01-20 DIAGNOSIS — I482 Chronic atrial fibrillation: Secondary | ICD-10-CM | POA: Diagnosis not present

## 2016-01-20 DIAGNOSIS — N390 Urinary tract infection, site not specified: Secondary | ICD-10-CM | POA: Diagnosis not present

## 2016-01-20 DIAGNOSIS — Z7901 Long term (current) use of anticoagulants: Secondary | ICD-10-CM | POA: Diagnosis not present

## 2016-02-09 DIAGNOSIS — R300 Dysuria: Secondary | ICD-10-CM | POA: Diagnosis not present

## 2016-02-17 DIAGNOSIS — I639 Cerebral infarction, unspecified: Secondary | ICD-10-CM | POA: Diagnosis not present

## 2016-02-17 DIAGNOSIS — Z7901 Long term (current) use of anticoagulants: Secondary | ICD-10-CM | POA: Diagnosis not present

## 2016-02-17 DIAGNOSIS — I482 Chronic atrial fibrillation: Secondary | ICD-10-CM | POA: Diagnosis not present

## 2016-03-12 NOTE — Progress Notes (Signed)
Cardiology Office Note  Date: 03/13/2016   ID: DAVIT BRETL, DOB 28-Aug-1934, MRN [id number]  PCP: Wende Neighbors, MD  Primary Cardiologist: Rozann Lesches, MD   Chief Complaint  Patient presents with  . Coronary Artery Disease    History of Present Illness: Benjamin Cordova is a 81 y.o. male last seen in September.He presents for a follow-up visit. From a cardiac perspective he does not report any angina symptoms or nitroglycerin use. States that he is getting over a cold recently, has had chest congestion and generalized malaise, no fevers or chills.  He continues on Coumadin, followed by Dr. Nevada Crane. Does not report any bleeding problems.  Cardiac testing from earlier this year is detailed below. His LVEF is in the range of 35-40% and we have been continuing medical therapy and observation. He does not endorse worsening angina with Myoview study indicating mixed scar with peri-infarct ischemia.  Past Medical History:  Diagnosis Date  . Arthritis   . Coronary artery disease    Multivessel s/p CABG [facility] at [facility]  . Essential hypertension   . Hematuria   . High cholesterol   . History of blood transfusion 07/2015   Knee replacement HgB <9  . History of pneumonia   . History of stroke   . Myocardial infarction 1980  . Peripheral vascular disease ([place])   . Polio osteopathy of lower leg ([place]) Age 67   Affected right leg  . Skin cancer   . TIA (transient ischemic attack) 1989   Chronic coumadin  . UTI (lower urinary tract infection)     Past Surgical History:  Procedure Laterality Date  . CARDIAC CATHETERIZATION N/A 10/05/2014   Procedure: Left Heart Cath and Coronary Angiography;  Surgeon: Peter M Martinique, MD;  Location: [place] [facility];  Service: Cardiovascular;  Laterality: N/A;  . CORONARY ARTERY BYPASS GRAFT  1981   [facility]  . CORONARY ARTERY BYPASS GRAFT N/A 10/08/2014   Procedure: REDO CORONARY ARTERY BYPASS GRAFTING (CABG) Times Two Grafts using Left Internal  Mammary and Left Radial Artery;  Surgeon: Melrose Nakayama, MD;  Location: [place];  Service: Open Heart Surgery;  Laterality: N/A;  . CYSTOSCOPY WITH INJECTION  10/10/2010   Procedure: CYSTOSCOPY WITH INJECTION;  Surgeon: Marissa Nestle;  [facility];  Service: Urology;  Laterality: N/A;  with retrograde urethrogram  . FOOT SURGERY Right 1954   [facility], muscle implantation  . JOINT REPLACEMENT    . RADIAL ARTERY HARVEST Left 10/08/2014   Procedure: Left RADIAL ARTERY HARVEST;  Surgeon: Melrose Nakayama, MD;  Location: [place];  Service: Open Heart Surgery;  Laterality: Left;  . TEE WITHOUT CARDIOVERSION N/A 10/08/2014   Procedure: TRANSESOPHAGEAL ECHOCARDIOGRAM (TEE);  Surgeon: Melrose Nakayama, MD;  Location: [place];  Service: Open Heart Surgery;  Laterality: N/A;  . TONSILLECTOMY  1949   [facility]  . TOTAL KNEE ARTHROPLASTY Left 07/27/2015   Procedure: TOTAL LEFT KNEE ARTHROPLASTY;  Surgeon: [name], MD;  Location: [place];  Service: Orthopedics;  Laterality: Left;    Current Outpatient Prescriptions  Medication Sig Dispense Refill  . B Complex-C-Folic Acid (SUPER B-COMPLEX/VIT C/FA) TABS Take 1 tablet by mouth daily.      . carvedilol (COREG) 12.5 MG tablet Take 1 tablet (12.5 mg total) by mouth 2 (two) times daily. 180 tablet 3  . diphenhydrAMINE (SOMINEX) 25 MG tablet Take 25 mg by mouth at bedtime as needed for sleep.    . folic acid (  FOLVITE) 800 MCG tablet Take 400 mcg by mouth daily.     . hydrochlorothiazide (HYDRODIURIL) 25 MG tablet Take 25 mg by mouth daily.    Marland Kitchen lisinopril (PRINIVIL,ZESTRIL) 10 MG tablet Take 0.5 tablets (5 mg total) by mouth daily. 30 tablet 0  . magnesium oxide (MAG-OX) 400 MG tablet Take 250 mg by mouth at bedtime.     . Misc Natural Products (URINOZINC PLUS PO) Take 1 tablet by mouth daily.    . Multiple Vitamins-Minerals (CERTAVITE/ANTIOXIDANTS) TABS Take 1 tablet by mouth daily.     Marland Kitchen oxyCODONE-acetaminophen (ROXICET) 5-325 MG tablet Take 1-2  tablets by mouth every 4 (four) hours as needed. 60 tablet 0  . polyethylene glycol (MIRALAX / GLYCOLAX) packet Take 17 g by mouth daily. 14 each 0  . senna (SENOKOT) 8.6 MG tablet Take 1 tablet by mouth 2 (two) times daily.     . simvastatin (ZOCOR) 40 MG tablet Take 40 mg by mouth daily.    . vitamin C (ASCORBIC ACID) 500 MG tablet Take 500 mg by mouth daily.      Marland Kitchen warfarin (COUMADIN) 5 MG tablet Take 1.5 tablets (7.5 mg total) by mouth daily at 6 PM. OR AS DIRECTED by MD. 60 tablet 0   No current facility-administered medications for this visit.    Allergies:  Patient has no known allergies.   Social History: The patient  reports that he quit smoking about 37 years ago. His smoking use included Cigarettes. He has a 28.00 pack-year smoking history. He has never used smokeless tobacco. He reports that he does not drink alcohol or use drugs.   ROS:  Please see the history of present illness. Otherwise, complete review of systems is positive for chest congestion.  All other systems are reviewed and negative.   Physical Exam: VS:  BP (!) 142/84   Pulse 90   Ht 5\' 8"  (1.727 m)   Wt 242 lb (109.8 kg)   SpO2 98%   BMI 36.80 kg/m , BMI Body mass index is 36.8 kg/m.  Wt Readings from Last 3 Encounters:  03/13/16 242 lb (109.8 kg)  12/02/15 233 lb (105.7 kg)  09/23/15 226 lb (102.5 kg)    General: Overweight male, appears comfortable at rest. HEENT: Conjunctiva and lids normal, oropharynx clear. Neck: Supple, no elevated JVP or carotid bruits, no thyromegaly. Lungs: Scattered rhonchi without wheezing, nonlabored breathing at rest. Thorax: Well-healed sternal incision. Cardiac: Regular rate and rhythm with ectopy, no S3, soft systolic murmur, no pericardial rub. Abdomen: Soft, nontender, bowel sounds present. Extremities: Trace leg edema, distal pulses 2+.  ECG: I personally reviewed the tracing from 08/31/2015 which showed sinus tachycardia with PVC.  Recent Labwork: 08/31/2015: B  Natriuretic Peptide 349.0 09/01/2015: Magnesium 2.0 09/02/2015: ALT 16; AST 22; TSH 1.363 09/06/2015: BUN 17; Creatinine, Ser 0.83; Hemoglobin 10.0; Platelets 164; Potassium 3.8; Sodium 138     Component Value Date/Time   CHOL 118 (L) 03/05/2015 0834   TRIG 104 03/05/2015 0834   HDL 39 (L) 03/05/2015 0834   CHOLHDL 3.0 03/05/2015 0834   VLDL 21 03/05/2015 0834   LDLCALC 58 03/05/2015 0834    Other Studies Reviewed Today:  Carlton Adam Myoview 09/03/2015: FINDINGS: Perfusion: There is a large fixed defect at the apex extending into the anterior and inferior walls compatible with old infarct/scar. Areas of decreased perfusion on the stress images in the adjacent anterior wall and lower septum concerning for peri-infarct ischemia.  Wall Motion: Severe diffuse hypokinesia with akinesia in  the apex, anterior and inferior walls. Dilated left ventricle.  Left Ventricular Ejection Fraction: 26 %  End diastolic volume 123456 ml  End systolic volume 123XX123 ml  IMPRESSION: 1. Large fixed defect at the apex extending into the anterior and inferior walls. Concern for peri-infarct ischemia in the anterior wall and lower septum.  2. Severe diffuse hypokinesia with akinesia in the areas of scar.  3. Left ventricular ejection fraction 26%  4. Non invasive risk stratification*: High risk  Echocardiogram 11/29/2015: Study Conclusions  - Left ventricle: Systolic function was moderately reduced. The estimated ejection fraction was in the range of 35% to 40%. Left ventricular diastolic function parameters were normal. - Mitral valve: There was mild regurgitation. - Left atrium: The atrium was moderately dilated. - Atrial septum: No defect or patent foramen ovale was identified.  Assessment and Plan:  1. Multivessel CAD status post CABG with redo operation in July 2016. He does not report active angina symptoms on present medical regimen. Cardiolite from this past summer showed mixed scar  with peri-infarct ischemia which we are managing medically at this point.  2. Ischemic cardiomyopathy with LVEF 35-40%. No changes made to current regimen. He continues on Coreg, HCTZ, and lisinopril.  3. Prior history of TIA and stroke. He is on Coumadin with follow-up per Dr. Nevada Crane.  Current medicines were reviewed with the patient today.   Disposition: Follow-up in 6 months, sooner if needed.  Signed, Satira Sark, MD, [facility] 03/13/2016 4:20 PM    [facility] [place], [place], [place] [address] Phone: [phone]; Fax: [phone]

## 2016-03-13 ENCOUNTER — Ambulatory Visit (INDEPENDENT_AMBULATORY_CARE_PROVIDER_SITE_OTHER): Payer: Commercial Managed Care - HMO | Admitting: Cardiology

## 2016-03-13 ENCOUNTER — Encounter: Payer: Self-pay | Admitting: Cardiology

## 2016-03-13 VITALS — BP 142/84 | HR 90 | Ht 68.0 in | Wt 242.0 lb

## 2016-03-13 DIAGNOSIS — Z8673 Personal history of transient ischemic attack (TIA), and cerebral infarction without residual deficits: Secondary | ICD-10-CM | POA: Diagnosis not present

## 2016-03-13 DIAGNOSIS — I2510 Atherosclerotic heart disease of native coronary artery without angina pectoris: Secondary | ICD-10-CM | POA: Diagnosis not present

## 2016-03-13 DIAGNOSIS — I255 Ischemic cardiomyopathy: Secondary | ICD-10-CM | POA: Diagnosis not present

## 2016-03-13 NOTE — Patient Instructions (Signed)

## 2016-03-30 DIAGNOSIS — I482 Chronic atrial fibrillation: Secondary | ICD-10-CM | POA: Diagnosis not present

## 2016-03-30 DIAGNOSIS — Z6837 Body mass index (BMI) 37.0-37.9, adult: Secondary | ICD-10-CM | POA: Diagnosis not present

## 2016-03-30 DIAGNOSIS — Z7901 Long term (current) use of anticoagulants: Secondary | ICD-10-CM | POA: Diagnosis not present

## 2016-04-11 DIAGNOSIS — I482 Chronic atrial fibrillation: Secondary | ICD-10-CM | POA: Diagnosis not present

## 2016-04-11 DIAGNOSIS — Z7901 Long term (current) use of anticoagulants: Secondary | ICD-10-CM | POA: Diagnosis not present

## 2016-04-11 DIAGNOSIS — I639 Cerebral infarction, unspecified: Secondary | ICD-10-CM | POA: Diagnosis not present

## 2016-04-11 DIAGNOSIS — N390 Urinary tract infection, site not specified: Secondary | ICD-10-CM | POA: Diagnosis not present

## 2016-04-11 DIAGNOSIS — M545 Low back pain: Secondary | ICD-10-CM | POA: Diagnosis not present

## 2016-04-23 DIAGNOSIS — M545 Low back pain: Secondary | ICD-10-CM | POA: Diagnosis not present

## 2016-04-23 DIAGNOSIS — N390 Urinary tract infection, site not specified: Secondary | ICD-10-CM | POA: Diagnosis not present

## 2016-05-09 DIAGNOSIS — I639 Cerebral infarction, unspecified: Secondary | ICD-10-CM | POA: Diagnosis not present

## 2016-05-09 DIAGNOSIS — Z7901 Long term (current) use of anticoagulants: Secondary | ICD-10-CM | POA: Diagnosis not present

## 2016-05-09 DIAGNOSIS — I482 Chronic atrial fibrillation: Secondary | ICD-10-CM | POA: Diagnosis not present

## 2016-05-17 DIAGNOSIS — H40033 Anatomical narrow angle, bilateral: Secondary | ICD-10-CM | POA: Diagnosis not present

## 2016-05-17 DIAGNOSIS — H2513 Age-related nuclear cataract, bilateral: Secondary | ICD-10-CM | POA: Diagnosis not present

## 2016-05-30 DIAGNOSIS — I482 Chronic atrial fibrillation: Secondary | ICD-10-CM | POA: Diagnosis not present

## 2016-05-30 DIAGNOSIS — Z7901 Long term (current) use of anticoagulants: Secondary | ICD-10-CM | POA: Diagnosis not present

## 2016-06-13 DIAGNOSIS — I10 Essential (primary) hypertension: Secondary | ICD-10-CM | POA: Diagnosis not present

## 2016-06-13 DIAGNOSIS — R7301 Impaired fasting glucose: Secondary | ICD-10-CM | POA: Diagnosis not present

## 2016-06-14 DIAGNOSIS — E782 Mixed hyperlipidemia: Secondary | ICD-10-CM | POA: Diagnosis not present

## 2016-06-14 DIAGNOSIS — I10 Essential (primary) hypertension: Secondary | ICD-10-CM | POA: Diagnosis not present

## 2016-06-14 DIAGNOSIS — R7301 Impaired fasting glucose: Secondary | ICD-10-CM | POA: Diagnosis not present

## 2016-06-14 DIAGNOSIS — D649 Anemia, unspecified: Secondary | ICD-10-CM | POA: Diagnosis not present

## 2016-06-14 DIAGNOSIS — Z0000 Encounter for general adult medical examination without abnormal findings: Secondary | ICD-10-CM | POA: Diagnosis not present

## 2016-06-14 DIAGNOSIS — Z6837 Body mass index (BMI) 37.0-37.9, adult: Secondary | ICD-10-CM | POA: Diagnosis not present

## 2016-06-14 DIAGNOSIS — Z8673 Personal history of transient ischemic attack (TIA), and cerebral infarction without residual deficits: Secondary | ICD-10-CM | POA: Diagnosis not present

## 2016-06-14 DIAGNOSIS — G14 Postpolio syndrome: Secondary | ICD-10-CM | POA: Diagnosis not present

## 2016-06-14 DIAGNOSIS — I519 Heart disease, unspecified: Secondary | ICD-10-CM | POA: Diagnosis not present

## 2016-06-26 DIAGNOSIS — I482 Chronic atrial fibrillation: Secondary | ICD-10-CM | POA: Diagnosis not present

## 2016-07-07 IMAGING — DX DG KNEE COMPLETE 4+V*R*
4 series · 4 of 4 positions shown · non-contrast
Comparison: None.

CLINICAL DATA: Injured by lawnmower, knee pain

EXAM:
RIGHT KNEE - COMPLETE 4+ VIEW

[knee ap]
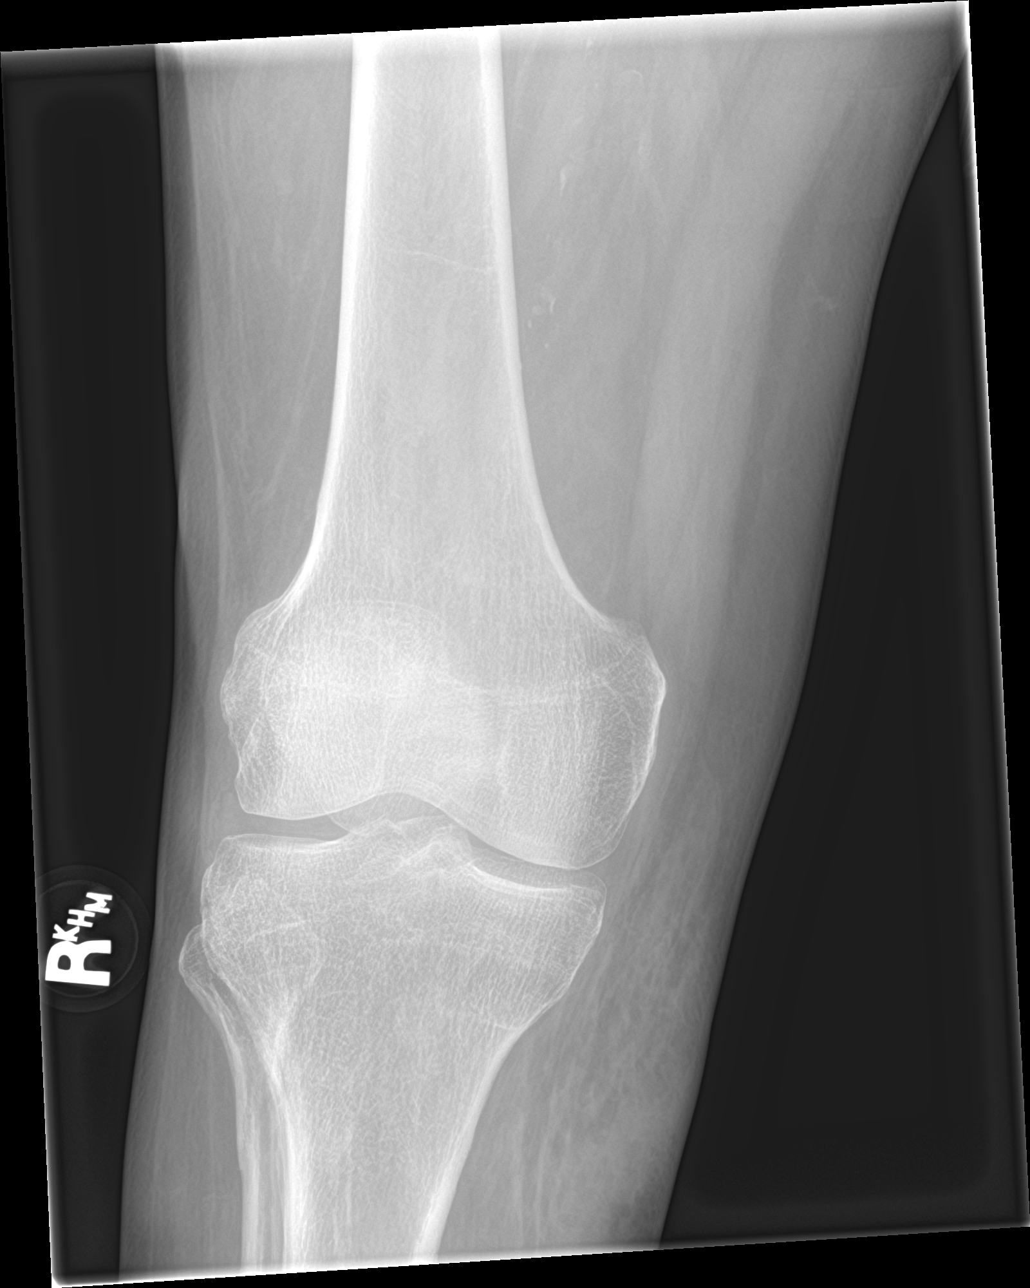

[knee obl (1 of 2)]
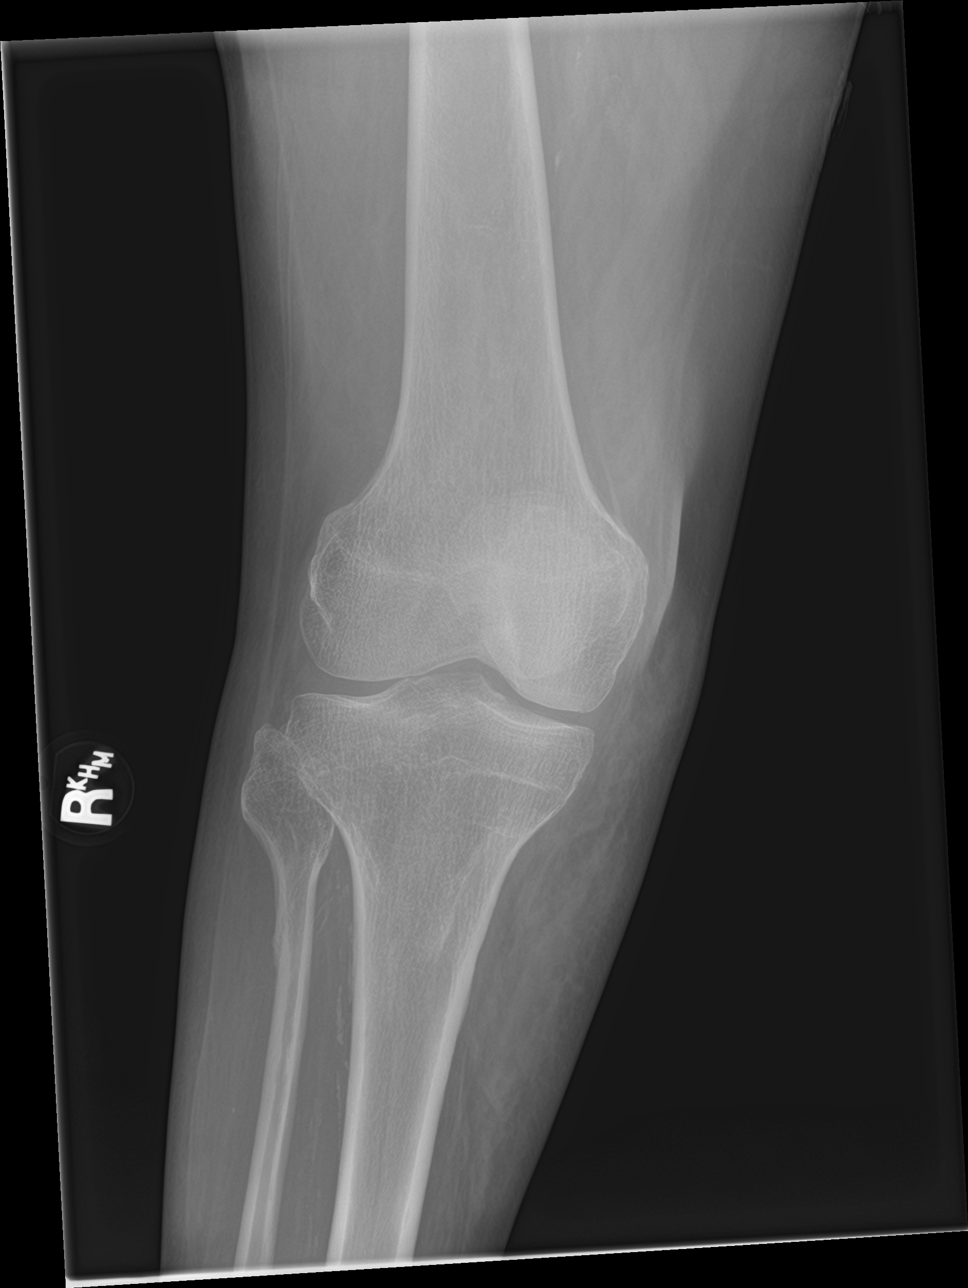

[knee obl (2 of 2)]
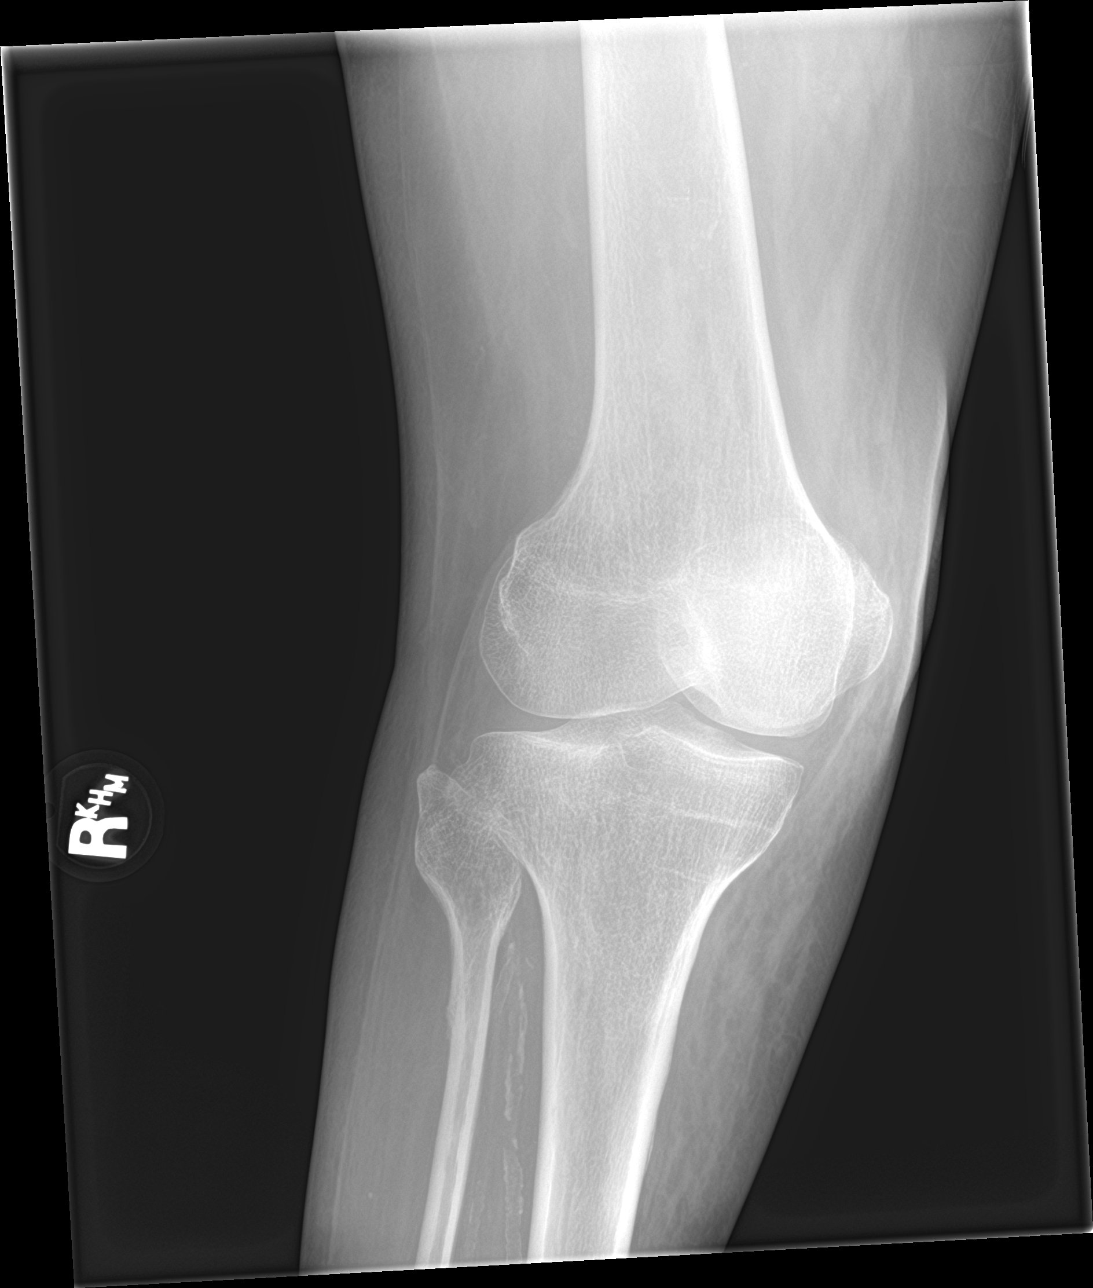

[knee lat]
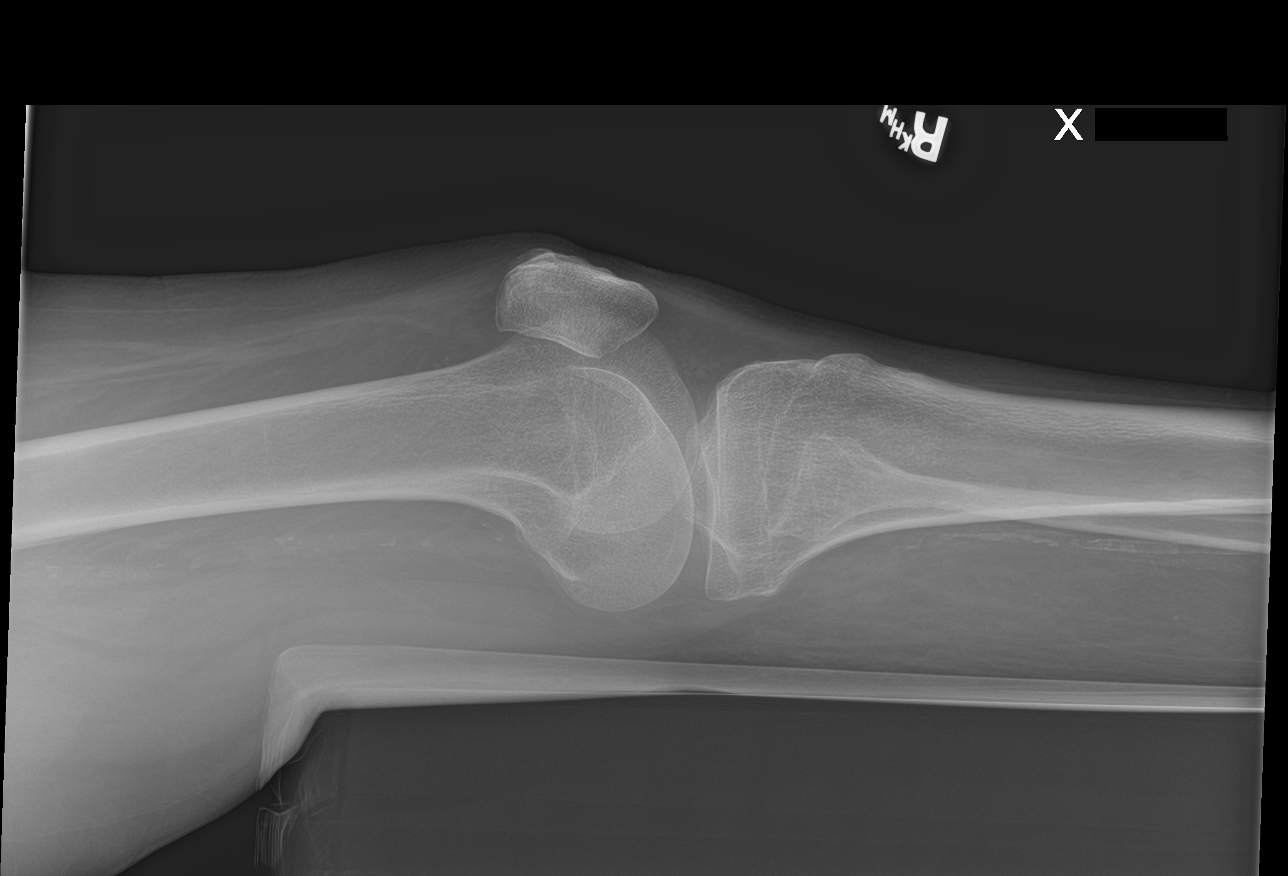

[4 of 4 positions shown; findings below may reference images not displayed]

FINDINGS: There is no evidence of fracture, dislocation, or joint effusion.
There is no evidence of arthropathy or other focal bone abnormality.
Medial soft tissue swelling is evident. Vascular calcifications are
identified. Deformity likely indicating healed remote fracture of
the proximal fibula is identified.
IMPRESSION: Medial soft tissue swelling.  No acute osseous abnormality.

## 2016-07-24 DIAGNOSIS — Z7901 Long term (current) use of anticoagulants: Secondary | ICD-10-CM | POA: Diagnosis not present

## 2016-07-24 DIAGNOSIS — I639 Cerebral infarction, unspecified: Secondary | ICD-10-CM | POA: Diagnosis not present

## 2016-08-21 DIAGNOSIS — I639 Cerebral infarction, unspecified: Secondary | ICD-10-CM | POA: Diagnosis not present

## 2016-08-21 DIAGNOSIS — Y92481 Parking lot as the place of occurrence of the external cause: Secondary | ICD-10-CM | POA: Diagnosis not present

## 2016-08-21 DIAGNOSIS — W010XXA Fall on same level from slipping, tripping and stumbling without subsequent striking against object, initial encounter: Secondary | ICD-10-CM | POA: Diagnosis not present

## 2016-08-21 DIAGNOSIS — Z7901 Long term (current) use of anticoagulants: Secondary | ICD-10-CM | POA: Diagnosis not present

## 2016-09-12 NOTE — Progress Notes (Signed)
Cardiology Office Note  Date: 09/13/2016   ID: HARLES EVETTS, DOB 1934/04/07, MRN [id number]  PCP: Celene Squibb, MD  Primary Cardiologist: Rozann Lesches, MD   Chief Complaint  Patient presents with  . Coronary Artery Disease    History of Present Illness: NGAI PARCELL is a 82 y.o. male last seen in December 2017. He presents for a routine follow-up visit. Reports no angina symptoms or worsening shortness of breath. He has been bowling regularly in a league in [place].  He continues on Coumadin, followed by Dr. Nevada Crane. Reports no bleeding episodes.  I reviewed his cardiac medications which include Coreg, lisinopril, HCTZ, and Zocor.  I personally reviewed his ECG today which shows sinus rhythm with PVC and fusion beat, old anterior infarct pattern, nonspecific ST-T changes.  Past Medical History:  Diagnosis Date  . Arthritis   . Coronary artery disease    Multivessel s/p CABG 1981 at [facility]  . Essential hypertension   . Hematuria   . High cholesterol   . History of blood transfusion 07/2015   Knee replacement HgB <9  . History of pneumonia   . History of stroke   . Myocardial infarction ([place]) 1980  . Peripheral vascular disease ([place])   . 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 Skin cancer   . TIA (transient ischemic attack) 1989   Chronic coumadin  . UTI (lower urinary tract infection)     Past Surgical History:  Procedure Laterality Date  . CARDIAC CATHETERIZATION N/A 10/05/2014   Procedure: Left Heart Cath and Coronary Angiography;  Surgeon: Peter M Martinique, MD;  Location: [facility];  Service: Cardiovascular;  Laterality: N/A;  . CORONARY ARTERY BYPASS GRAFT  1981   [facility]  . CORONARY ARTERY BYPASS GRAFT N/A 10/08/2014   Procedure: REDO CORONARY ARTERY BYPASS GRAFTING (CABG) Times Two Grafts using Left Internal Mammary and Left Radial Artery;  Surgeon: Melrose Nakayama, MD;  Location: [facility];  Service: Open Heart  Surgery;  Laterality: N/A;  . CYSTOSCOPY WITH INJECTION  10/10/2010   Procedure: CYSTOSCOPY WITH INJECTION;  Surgeon: Marissa Nestle;  [facility]: [facility];  Service: Urology;  Laterality: N/A;  with retrograde urethrogram  . FOOT SURGERY Right 1954   [facility], muscle implantation  . JOINT REPLACEMENT    . RADIAL ARTERY HARVEST Left 10/08/2014   Procedure: Left RADIAL ARTERY HARVEST;  Surgeon: Melrose Nakayama, MD;  Location: [facility];  Service: Open Heart Surgery;  Laterality: Left;  . TEE WITHOUT CARDIOVERSION N/A 10/08/2014   Procedure: TRANSESOPHAGEAL ECHOCARDIOGRAM (TEE);  Surgeon: Melrose Nakayama, MD;  Location: [facility];  Service: Open Heart Surgery;  Laterality: N/A;  . TONSILLECTOMY  1949   [facility]  . TOTAL KNEE ARTHROPLASTY Left 07/27/2015   Procedure: TOTAL LEFT KNEE ARTHROPLASTY;  Surgeon: [name], MD;  Location: [facility];  Service: Orthopedics;  Laterality: Left;    Current Outpatient Prescriptions  Medication Sig Dispense Refill  . B Complex-C-Folic Acid (SUPER B-COMPLEX/VIT C/FA) TABS Take 1 tablet by mouth daily.      . carvedilol (COREG) 12.5 MG tablet Take 1 tablet (12.5 mg total) by mouth 2 (two) times daily. 180 tablet 3  . diphenhydramine-acetaminophen (TYLENOL PM) 25-500 MG TABS tablet Take 2 tablets by mouth at bedtime as needed.    . folic acid (FOLVITE) 267 MCG tablet Take 400 mcg by mouth daily.     . hydrochlorothiazide (HYDRODIURIL) 25  MG tablet Take 25 mg by mouth daily.    Marland Kitchen lisinopril (PRINIVIL,ZESTRIL) 10 MG tablet Take 0.5 tablets (5 mg total) by mouth daily. 30 tablet 0  . Multiple Vitamins-Minerals (CERTAVITE/ANTIOXIDANTS) TABS Take 1 tablet by mouth daily.     . polyethylene glycol (MIRALAX / GLYCOLAX) packet Take 17 g by mouth daily. 14 each 0  . Saw Palmetto 500 MG CAPS Take 1 capsule by mouth 2 (two) times daily.    . simvastatin (ZOCOR) 40 MG tablet Take 40 mg by mouth daily.    . vitamin C (ASCORBIC ACID) 500 MG tablet Take 500 mg by mouth daily.      Marland Kitchen  warfarin (COUMADIN) 5 MG tablet Take 1.5 tablets (7.5 mg total) by mouth daily at 6 PM. OR AS DIRECTED by MD. 60 tablet 0   No current facility-administered medications for this visit.    Allergies:  Patient has no known allergies.   Social History: The patient  reports that he quit smoking about 38 years ago. His smoking use included Cigarettes. He has a 28.00 pack-year smoking history. He has never used smokeless tobacco. He reports that he does not drink alcohol or use drugs.   ROS:  Please see the history of present illness. Otherwise, complete review of systems is positive for chronic right leg weakness.  All other systems are reviewed and negative.   Physical Exam: VS:  BP 132/70   Pulse 87   Ht 5\' 8"  (1.727 m)   Wt 239 lb 12.8 oz (108.8 kg)   SpO2 95%   BMI 36.46 kg/m , BMI Body mass index is 36.46 kg/m.  Wt Readings from Last 3 Encounters:  09/13/16 239 lb 12.8 oz (108.8 kg)  03/13/16 242 lb (109.8 kg)  12/02/15 233 lb (105.7 kg)    General: Overweight male, appears comfortable at rest. HEENT: Conjunctiva and lids normal, oropharynx clear. Neck: Supple, no elevated JVP or carotid bruits, no thyromegaly. Lungs: Scattered rhonchi without wheezing, nonlabored breathing at rest. Thorax: Well-healed sternal incision. Cardiac: Regular rate and rhythm with ectopy, no S3, soft systolic murmur, no pericardial rub. Abdomen: Soft, nontender, bowel sounds present. Extremities: Trace leg edema, distal pulses 2+.Brace on right leg. Skin: Warm and dry. Musculoskeletal: No kyphosis. Neuropsychiatric: Alert and oriented 3, affect appropriate.  ECG: I personally reviewed the tracing from 08/31/2015 which showed sinus tachycardia with PVC.  Recent Labwork:  09/01/2015: Magnesium 2.0 09/02/2015: ALT 16; AST 22; TSH 1.363 09/06/2015: BUN 17; Creatinine, Ser 0.83; Hemoglobin 10.0; Platelets 164; Potassium 3.8; Sodium 138   Other Studies Reviewed Today:  Lexiscan Myoview  09/03/2015: FINDINGS: Perfusion: There is a large fixed defect at the apex extending into the anterior and inferior walls compatible with old infarct/scar. Areas of decreased perfusion on the stress images in the adjacent anterior wall and lower septum concerning for peri-infarct ischemia.  Wall Motion: Severe diffuse hypokinesia with akinesia in the apex, anterior and inferior walls. Dilated left ventricle.  Left Ventricular Ejection Fraction: 26 %  End diastolic volume 427 ml  End systolic volume 062 ml  IMPRESSION: 1. Large fixed defect at the apex extending into the anterior and inferior walls. Concern for peri-infarct ischemia in the anterior wall and lower septum.  2. Severe diffuse hypokinesia with akinesia in the areas of scar.  3. Left ventricular ejection fraction 26%  4. Non invasive risk stratification*: High risk  Echocardiogram 11/29/2015: Study Conclusions  - Left ventricle: Systolic function was moderately reduced. The estimated ejection fraction was in  the range of 35% to 40%. Left ventricular diastolic function parameters were normal. - Mitral valve: There was mild regurgitation. - Left atrium: The atrium was moderately dilated. - Atrial septum: No defect or patent foramen ovale was identified.  Assessment and Plan:  1. Symptomatically stable multivessel CAD status post CABG with redo operation in July 2016. Plan to continue current medical regimen in the absence of progressive angina. Follow-up Myoview from June 2017 showed scar within the anterior and inferior walls with associated peri-infarct ischemia. LVEF was 35-40% by echocardiogram.  2. Ischemic cardiomyopathy with LVEF 35-40%. Continue medical therapy including Coreg and lisinopril. Diuretic in the form of HCTZ for now. His weight is down 3 pounds. He is not inclined to consider ICD at this point.  3. History of TIA and stroke. He remains on Coumadin with follow-up per Dr. Nevada Crane.  4.  Hyperlipidemia, continues on Zocor. Follow-up lab work per Dr. Nevada Crane.  Current medicines were reviewed with the patient today.   Orders Placed This Encounter  Procedures  . EKG 12-Lead    Disposition: Follow-up in 6 months.   Signed, Satira Sark, MD, [facility] 09/13/2016 1:15 PM    [facility] at [place], [place], [place] [address] Phone: [phone]; Fax: [phone]

## 2016-09-13 ENCOUNTER — Encounter: Payer: Self-pay | Admitting: Cardiology

## 2016-09-13 ENCOUNTER — Ambulatory Visit (INDEPENDENT_AMBULATORY_CARE_PROVIDER_SITE_OTHER): Payer: Medicare HMO | Admitting: Cardiology

## 2016-09-13 VITALS — BP 132/70 | HR 87 | Ht 68.0 in | Wt 239.8 lb

## 2016-09-13 DIAGNOSIS — I255 Ischemic cardiomyopathy: Secondary | ICD-10-CM | POA: Diagnosis not present

## 2016-09-13 DIAGNOSIS — E782 Mixed hyperlipidemia: Secondary | ICD-10-CM

## 2016-09-13 DIAGNOSIS — Z8673 Personal history of transient ischemic attack (TIA), and cerebral infarction without residual deficits: Secondary | ICD-10-CM

## 2016-09-13 DIAGNOSIS — I2510 Atherosclerotic heart disease of native coronary artery without angina pectoris: Secondary | ICD-10-CM | POA: Diagnosis not present

## 2016-09-13 NOTE — Patient Instructions (Signed)
Medication Instructions:  Your physician recommends that you continue on your current medications as directed. Please refer to the Current Medication list given to you today.  Labwork: NONE  Testing/Procedures: NONE  Follow-Up: Your physician wants you to follow-up in: 6 MONTHS WITH DR. MCDOWELL IN [place]. You will receive a reminder letter in the mail two months in advance. If you don't receive a letter, please call our office to schedule the follow-up appointment.  Any Other Special Instructions Will Be Listed Below (If Applicable).  If you need a refill on your cardiac medications before your next appointment, please call your pharmacy. 

## 2016-09-18 DIAGNOSIS — Z8673 Personal history of transient ischemic attack (TIA), and cerebral infarction without residual deficits: Secondary | ICD-10-CM | POA: Diagnosis not present

## 2016-09-18 DIAGNOSIS — Z7901 Long term (current) use of anticoagulants: Secondary | ICD-10-CM | POA: Diagnosis not present

## 2016-09-24 DIAGNOSIS — N390 Urinary tract infection, site not specified: Secondary | ICD-10-CM | POA: Diagnosis not present

## 2016-10-05 IMAGING — CR DG CHEST 1V PORT
1 series · 1 of 1 positions shown · non-contrast
Comparison: None.

CLINICAL DATA: Mid sternal region chest pain for 1 day

EXAM:
PORTABLE CHEST - 1 VIEW

[ap portable]
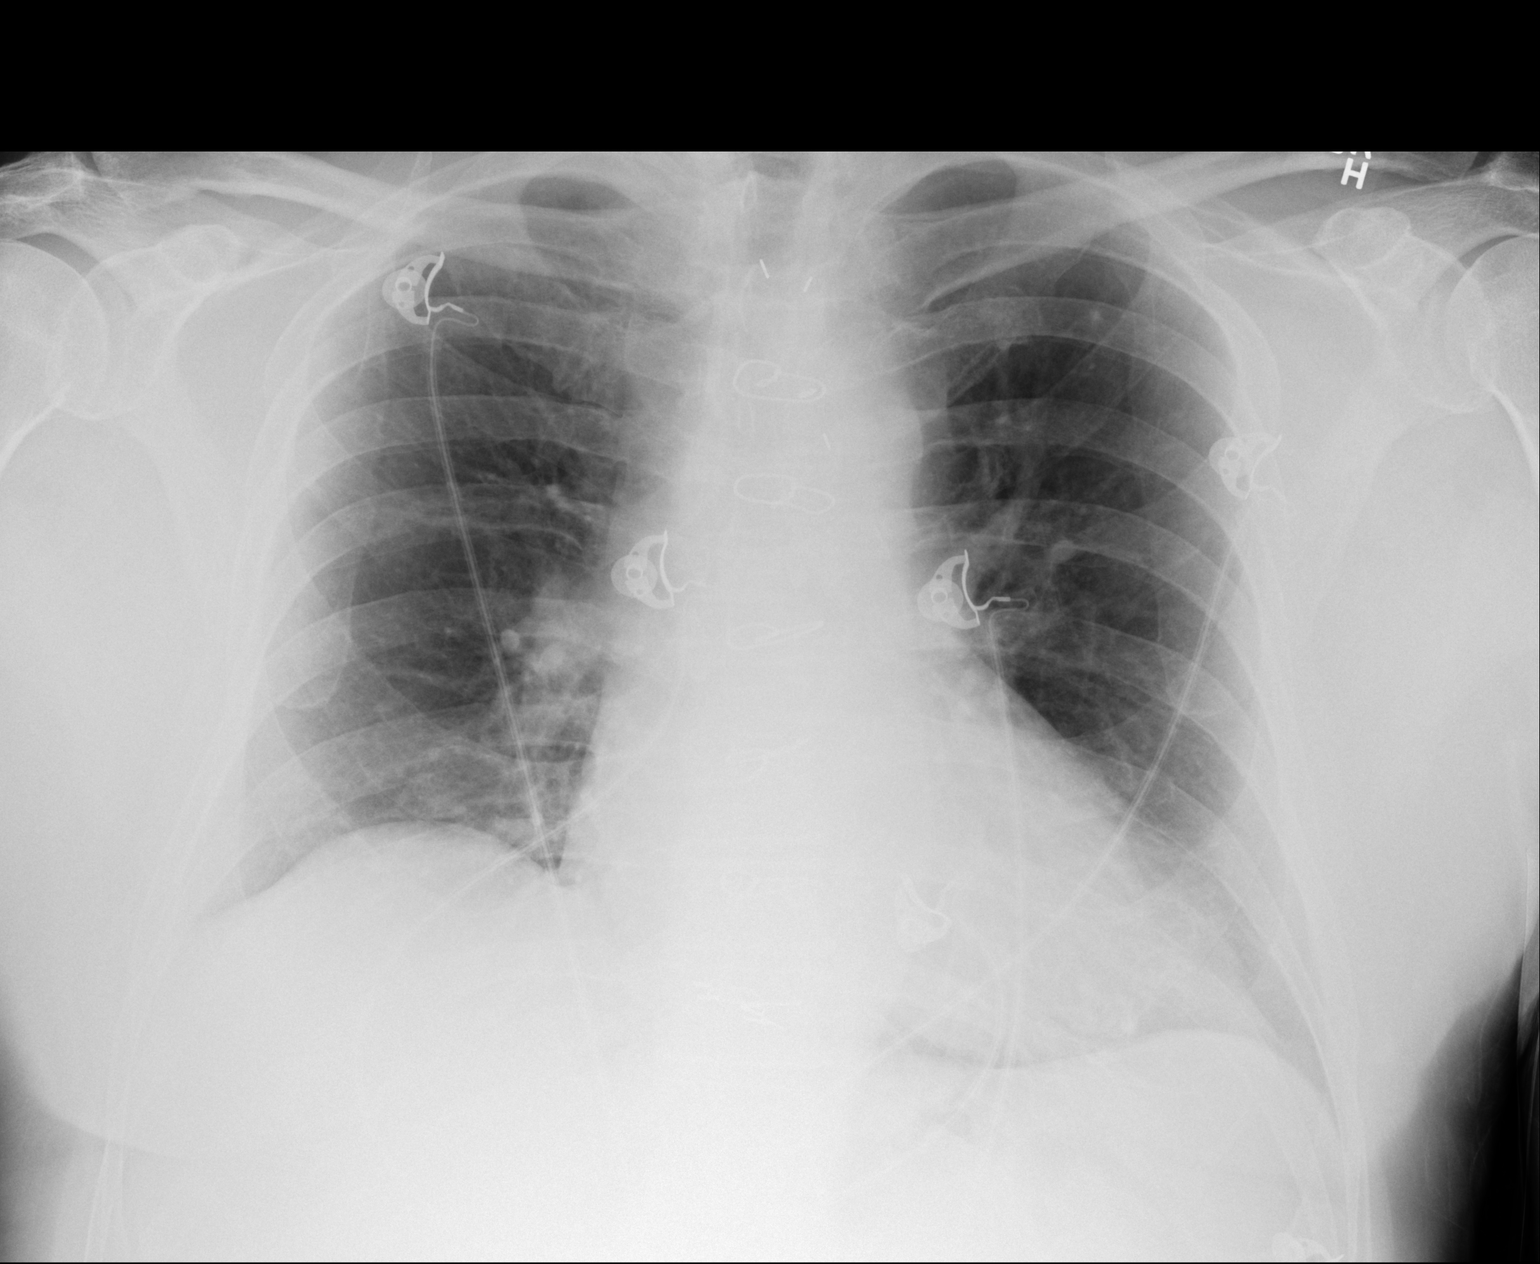

[1 of 1 positions shown; findings below may reference images not displayed]

FINDINGS: There is no edema or consolidation. Heart size and pulmonary
vascularity are normal. Patient is status post median sternotomy. No
adenopathy. No bone lesions.
IMPRESSION: No edema or consolidation.

## 2016-10-06 ENCOUNTER — Other Ambulatory Visit: Payer: Self-pay | Admitting: Cardiology

## 2016-10-16 DIAGNOSIS — N390 Urinary tract infection, site not specified: Secondary | ICD-10-CM | POA: Diagnosis not present

## 2016-10-16 DIAGNOSIS — Z7901 Long term (current) use of anticoagulants: Secondary | ICD-10-CM | POA: Diagnosis not present

## 2016-10-31 DIAGNOSIS — N390 Urinary tract infection, site not specified: Secondary | ICD-10-CM | POA: Diagnosis not present

## 2016-10-31 DIAGNOSIS — Z7901 Long term (current) use of anticoagulants: Secondary | ICD-10-CM | POA: Diagnosis not present

## 2016-10-31 DIAGNOSIS — Z6836 Body mass index (BMI) 36.0-36.9, adult: Secondary | ICD-10-CM | POA: Diagnosis not present

## 2016-11-07 DIAGNOSIS — Z7901 Long term (current) use of anticoagulants: Secondary | ICD-10-CM | POA: Diagnosis not present

## 2016-11-07 DIAGNOSIS — Z6836 Body mass index (BMI) 36.0-36.9, adult: Secondary | ICD-10-CM | POA: Diagnosis not present

## 2016-11-07 DIAGNOSIS — N390 Urinary tract infection, site not specified: Secondary | ICD-10-CM | POA: Diagnosis not present

## 2016-11-07 DIAGNOSIS — I482 Chronic atrial fibrillation: Secondary | ICD-10-CM | POA: Diagnosis not present

## 2016-11-21 DIAGNOSIS — I482 Chronic atrial fibrillation: Secondary | ICD-10-CM | POA: Diagnosis not present

## 2016-11-21 DIAGNOSIS — N390 Urinary tract infection, site not specified: Secondary | ICD-10-CM | POA: Diagnosis not present

## 2016-11-21 DIAGNOSIS — Z7901 Long term (current) use of anticoagulants: Secondary | ICD-10-CM | POA: Diagnosis not present

## 2016-11-28 DIAGNOSIS — Z6837 Body mass index (BMI) 37.0-37.9, adult: Secondary | ICD-10-CM | POA: Diagnosis not present

## 2016-11-28 DIAGNOSIS — N390 Urinary tract infection, site not specified: Secondary | ICD-10-CM | POA: Diagnosis not present

## 2016-11-28 DIAGNOSIS — I482 Chronic atrial fibrillation: Secondary | ICD-10-CM | POA: Diagnosis not present

## 2016-11-28 DIAGNOSIS — Z7901 Long term (current) use of anticoagulants: Secondary | ICD-10-CM | POA: Diagnosis not present

## 2016-11-28 DIAGNOSIS — R319 Hematuria, unspecified: Secondary | ICD-10-CM | POA: Diagnosis not present

## 2016-11-28 DIAGNOSIS — Z8673 Personal history of transient ischemic attack (TIA), and cerebral infarction without residual deficits: Secondary | ICD-10-CM | POA: Diagnosis not present

## 2016-12-14 DIAGNOSIS — D649 Anemia, unspecified: Secondary | ICD-10-CM | POA: Diagnosis not present

## 2016-12-14 DIAGNOSIS — I10 Essential (primary) hypertension: Secondary | ICD-10-CM | POA: Diagnosis not present

## 2016-12-14 DIAGNOSIS — R7301 Impaired fasting glucose: Secondary | ICD-10-CM | POA: Diagnosis not present

## 2016-12-17 DIAGNOSIS — Z23 Encounter for immunization: Secondary | ICD-10-CM | POA: Diagnosis not present

## 2016-12-17 DIAGNOSIS — D649 Anemia, unspecified: Secondary | ICD-10-CM | POA: Diagnosis not present

## 2016-12-17 DIAGNOSIS — G14 Postpolio syndrome: Secondary | ICD-10-CM | POA: Diagnosis not present

## 2016-12-17 DIAGNOSIS — Z6837 Body mass index (BMI) 37.0-37.9, adult: Secondary | ICD-10-CM | POA: Diagnosis not present

## 2016-12-17 DIAGNOSIS — E782 Mixed hyperlipidemia: Secondary | ICD-10-CM | POA: Diagnosis not present

## 2016-12-17 DIAGNOSIS — I10 Essential (primary) hypertension: Secondary | ICD-10-CM | POA: Diagnosis not present

## 2016-12-17 DIAGNOSIS — D696 Thrombocytopenia, unspecified: Secondary | ICD-10-CM | POA: Diagnosis not present

## 2016-12-17 DIAGNOSIS — I519 Heart disease, unspecified: Secondary | ICD-10-CM | POA: Diagnosis not present

## 2016-12-17 DIAGNOSIS — R7301 Impaired fasting glucose: Secondary | ICD-10-CM | POA: Diagnosis not present

## 2016-12-28 DIAGNOSIS — Z7901 Long term (current) use of anticoagulants: Secondary | ICD-10-CM | POA: Diagnosis not present

## 2016-12-28 DIAGNOSIS — Z8673 Personal history of transient ischemic attack (TIA), and cerebral infarction without residual deficits: Secondary | ICD-10-CM | POA: Diagnosis not present

## 2017-01-11 DIAGNOSIS — R3914 Feeling of incomplete bladder emptying: Secondary | ICD-10-CM | POA: Diagnosis not present

## 2017-01-11 DIAGNOSIS — N401 Enlarged prostate with lower urinary tract symptoms: Secondary | ICD-10-CM | POA: Diagnosis not present

## 2017-02-05 ENCOUNTER — Encounter: Payer: Self-pay | Admitting: Cardiology

## 2017-02-26 DIAGNOSIS — Z8673 Personal history of transient ischemic attack (TIA), and cerebral infarction without residual deficits: Secondary | ICD-10-CM | POA: Diagnosis not present

## 2017-03-14 ENCOUNTER — Ambulatory Visit: Payer: Medicare HMO | Admitting: Cardiology

## 2017-04-06 DIAGNOSIS — Z7901 Long term (current) use of anticoagulants: Secondary | ICD-10-CM | POA: Diagnosis not present

## 2017-04-06 DIAGNOSIS — Z8673 Personal history of transient ischemic attack (TIA), and cerebral infarction without residual deficits: Secondary | ICD-10-CM | POA: Diagnosis not present

## 2017-04-06 DIAGNOSIS — I482 Chronic atrial fibrillation: Secondary | ICD-10-CM | POA: Diagnosis not present

## 2017-04-15 NOTE — Progress Notes (Signed)
Cardiology Office Note  Date: 04/16/2017   ID: Benjamin Cordova, DOB 01-Feb-1935, MRN [id number]  PCP: Celene Squibb, MD  Primary Cardiologist: Rozann Lesches, MD   Chief Complaint  Patient presents with  . Coronary Artery Disease    History of Present Illness: Benjamin Cordova is an 83 y.o. male last seen in June 2018. He presents today for a routine follow-up visit. Since last assessment he does not report any recurrent angina symptoms. He spends a lot of time taking care of his wife, getting her to hemodialysis and other physical therapy. He states that he continues to bowl when he can. No other regular exercise plan.  We went over his cardiac medications which are stable and outlined below.  He remains on Coumadin with follow-up per Dr. Nevada Crane.  Past Medical History:  Diagnosis Date  . Arthritis   . Coronary artery disease    Multivessel s/p CABG 1981 at [facility]  . Essential hypertension   . Hematuria   . High cholesterol   . History of blood transfusion 07/2015   Knee replacement HgB <9  . History of pneumonia   . History of stroke   . Myocardial infarction ([place]) 1980  . Peripheral vascular disease ([place])   . Polio osteopathy of lower leg ([place]) Age 50   Affected right leg  . Skin cancer   . TIA (transient ischemic attack) 1989   Chronic coumadin  . UTI (lower urinary tract infection)     Past Surgical History:  Procedure Laterality Date  . CARDIAC CATHETERIZATION N/A 10/05/2014   Procedure: Left Heart Cath and Coronary Angiography;  Surgeon: Peter M Martinique, MD;  Location: [facility];  Service: Cardiovascular;  Laterality: N/A;  . CORONARY ARTERY BYPASS GRAFT  1981   [facility]  . CORONARY ARTERY BYPASS GRAFT N/A 10/08/2014   Procedure: REDO CORONARY ARTERY BYPASS GRAFTING (CABG) Times Two Grafts using Left Internal Mammary and Left Radial Artery;  Surgeon: Melrose Nakayama, MD;  Location: [facility];  Service: Open Heart Surgery;  Laterality: N/A;  . CYSTOSCOPY  WITH INJECTION  10/10/2010   Procedure: CYSTOSCOPY WITH INJECTION;  Surgeon: Marissa Nestle;  [facility];  Service: Urology;  Laterality: N/A;  with retrograde urethrogram  . FOOT SURGERY Right 1954   [facility], muscle implantation  . JOINT REPLACEMENT    . RADIAL ARTERY HARVEST Left 10/08/2014   Procedure: Left RADIAL ARTERY HARVEST;  Surgeon: Melrose Nakayama, MD;  Location: [facility];  Service: Open Heart Surgery;  Laterality: Left;  . TEE WITHOUT CARDIOVERSION N/A 10/08/2014   Procedure: TRANSESOPHAGEAL ECHOCARDIOGRAM (TEE);  Surgeon: Melrose Nakayama, MD;  Location: [facility];  Service: Open Heart Surgery;  Laterality: N/A;  . TONSILLECTOMY  1949   [facility]  . TOTAL KNEE ARTHROPLASTY Left 07/27/2015   Procedure: TOTAL LEFT KNEE ARTHROPLASTY;  Surgeon: [name], MD;  Location: [facility];  Service: Orthopedics;  Laterality: Left;    Current Outpatient Medications  Medication Sig Dispense Refill  . B Complex-C-Folic Acid (SUPER B-COMPLEX/VIT C/FA) TABS Take 1 tablet by mouth daily.      . carvedilol (COREG) 12.5 MG tablet TAKE 1 TABLET TWICE DAILY  (STOP  ATENOLOL) 180 tablet 3  . diphenhydramine-acetaminophen (TYLENOL PM) 25-500 MG TABS tablet Take 2 tablets by mouth at bedtime as needed.    . folic acid (FOLVITE) 100 MCG tablet Take 400 mcg by mouth daily.     . hydrochlorothiazide (HYDRODIURIL) 25 MG tablet  Take 25 mg by mouth daily.    Marland Kitchen lisinopril (PRINIVIL,ZESTRIL) 10 MG tablet Take 10 mg by mouth daily.    . Multiple Vitamins-Minerals (CERTAVITE/ANTIOXIDANTS) TABS Take 1 tablet by mouth daily.     . polyethylene glycol (MIRALAX / GLYCOLAX) packet Take 17 g by mouth daily. 14 each 0  . Saw Palmetto 500 MG CAPS Take 1 capsule by mouth 2 (two) times daily.    . simvastatin (ZOCOR) 40 MG tablet Take 40 mg by mouth daily.    . vitamin C (ASCORBIC ACID) 500 MG tablet Take 500 mg by mouth daily.      Marland Kitchen warfarin (COUMADIN) 5 MG tablet Take 1.5 tablets (7.5 mg total) by mouth daily at 6 PM.  OR AS DIRECTED by MD. (Patient taking differently: Take 7.5 mg by mouth daily at 6 PM. MANAGED MY PMD) 60 tablet 0   No current facility-administered medications for this visit.    Allergies:  Patient has no known allergies.   Social History: The patient  reports that he quit smoking about 38 years ago. His smoking use included cigarettes. He has a 28.00 pack-year smoking history. he has never used smokeless tobacco. He reports that he does not drink alcohol or use drugs.   ROS:  Please see the history of present illness. Otherwise, complete review of systems is positive for hearing loss, chronic leg weakness.  No palpitations or syncope. All other systems are reviewed and negative.   Physical Exam: VS:  BP (!) 92/54   Pulse 81   Ht 5\' 8"  (1.727 m)   Wt 245 lb 3.2 oz (111.2 kg)   SpO2 95%   BMI 37.28 kg/m , BMI Body mass index is 37.28 kg/m.  Wt Readings from Last 3 Encounters:  04/16/17 245 lb 3.2 oz (111.2 kg)  09/13/16 239 lb 12.8 oz (108.8 kg)  03/13/16 242 lb (109.8 kg)    General: Elderly male, appears comfortable at rest. Using a cane. HEENT: Conjunctiva and lids normal, oropharynx clear. Neck: Supple, no elevated JVP or carotid bruits, no thyromegaly. Lungs: Scattered rhonchi, nonlabored breathing at rest. Cardiac: Regular rate and rhythm, no S3, soft systolic murmur. Abdomen: Soft, nontender, bowel sounds present, no guarding or rebound. Extremities: Trace ankle edema, distal pulses 2+. Skin: Warm and dry. Musculoskeletal: No kyphosis. Neuropsychiatric: Alert and oriented x3, affect grossly appropriate.  ECG: I personally reviewed the tracing from 09/13/2016 which showed sinus rhythm with PVC and fusion beat, old anterior infarct pattern, diffuse nonspecific ST-T changes.  Recent Labwork:    Component Value Date/Time   CHOL 118 (L) 03/05/2015 0834   TRIG 104 03/05/2015 0834   HDL 39 (L) 03/05/2015 0834   CHOLHDL 3.0 03/05/2015 0834   VLDL 21 03/05/2015 0834    LDLCALC 58 03/05/2015 0834    Other Studies Reviewed Today:  Echocardiogram 11/29/2015: Study Conclusions  - Left ventricle: Systolic function was moderately reduced. The   estimated ejection fraction was in the range of 35% to 40%. Left   ventricular diastolic function parameters were normal. - Mitral valve: There was mild regurgitation. - Left atrium: The atrium was moderately dilated. - Atrial septum: No defect or patent foramen ovale was identified.  Lexiscan Myoview 09/03/2015: IMPRESSION: 1. Large fixed defect at the apex extending into the anterior and inferior walls. Concern for peri-infarct ischemia in the anterior wall and lower septum.  2. Severe diffuse hypokinesia with akinesia in the areas of scar.  3. Left ventricular ejection fraction 26%  4. Non  invasive risk stratification*: High risk  Assessment and Plan:  1. Multivessel CAD status post CABG and redo operation as of 2016. Plan is to continue medical therapy in the absence of progressive angina symptoms. Myoview study from 2017 is outlined above - we have held off cardiac catheterization after discussion.  2. Ischemic cardiomyopathy with LVEF 35-40%. He is tolerating medical therapy and not inclined to pursue ICD.  3. History of TIA and stroke. He is on Coumadin with follow-up per Dr. Nevada Crane.  4. Mixed hyperlipidemia, remains on statin therapy.  Current medicines were reviewed with the patient today.  Disposition: Follow-up in 6 months.  Signed, Satira Sark, MD, [facility] 04/16/2017 11:51 AM    [facility] at [place], [place], [place] [address] Phone: [phone]; Fax: [phone]

## 2017-04-16 ENCOUNTER — Encounter: Payer: Self-pay | Admitting: Cardiology

## 2017-04-16 ENCOUNTER — Ambulatory Visit: Payer: Medicare HMO | Admitting: Cardiology

## 2017-04-16 VITALS — BP 92/54 | HR 81 | Ht 68.0 in | Wt 245.2 lb

## 2017-04-16 DIAGNOSIS — I255 Ischemic cardiomyopathy: Secondary | ICD-10-CM | POA: Diagnosis not present

## 2017-04-16 DIAGNOSIS — E782 Mixed hyperlipidemia: Secondary | ICD-10-CM

## 2017-04-16 DIAGNOSIS — N401 Enlarged prostate with lower urinary tract symptoms: Secondary | ICD-10-CM | POA: Diagnosis not present

## 2017-04-16 DIAGNOSIS — R3914 Feeling of incomplete bladder emptying: Secondary | ICD-10-CM | POA: Diagnosis not present

## 2017-04-16 DIAGNOSIS — I2510 Atherosclerotic heart disease of native coronary artery without angina pectoris: Secondary | ICD-10-CM

## 2017-04-16 DIAGNOSIS — Z8673 Personal history of transient ischemic attack (TIA), and cerebral infarction without residual deficits: Secondary | ICD-10-CM | POA: Diagnosis not present

## 2017-04-16 NOTE — Patient Instructions (Signed)

## 2017-05-04 DIAGNOSIS — Z7901 Long term (current) use of anticoagulants: Secondary | ICD-10-CM | POA: Diagnosis not present

## 2017-05-04 DIAGNOSIS — I482 Chronic atrial fibrillation: Secondary | ICD-10-CM | POA: Diagnosis not present

## 2017-05-04 DIAGNOSIS — Z8673 Personal history of transient ischemic attack (TIA), and cerebral infarction without residual deficits: Secondary | ICD-10-CM | POA: Diagnosis not present

## 2017-06-01 DIAGNOSIS — D649 Anemia, unspecified: Secondary | ICD-10-CM | POA: Diagnosis not present

## 2017-06-01 DIAGNOSIS — Z7901 Long term (current) use of anticoagulants: Secondary | ICD-10-CM | POA: Diagnosis not present

## 2017-06-01 DIAGNOSIS — I482 Chronic atrial fibrillation: Secondary | ICD-10-CM | POA: Diagnosis not present

## 2017-06-01 DIAGNOSIS — G14 Postpolio syndrome: Secondary | ICD-10-CM | POA: Diagnosis not present

## 2017-06-01 DIAGNOSIS — Z8673 Personal history of transient ischemic attack (TIA), and cerebral infarction without residual deficits: Secondary | ICD-10-CM | POA: Diagnosis not present

## 2017-07-07 DIAGNOSIS — Z8673 Personal history of transient ischemic attack (TIA), and cerebral infarction without residual deficits: Secondary | ICD-10-CM | POA: Diagnosis not present

## 2017-07-07 DIAGNOSIS — Z7901 Long term (current) use of anticoagulants: Secondary | ICD-10-CM | POA: Diagnosis not present

## 2017-07-07 DIAGNOSIS — I482 Chronic atrial fibrillation: Secondary | ICD-10-CM | POA: Diagnosis not present

## 2017-08-01 DIAGNOSIS — D649 Anemia, unspecified: Secondary | ICD-10-CM | POA: Diagnosis not present

## 2017-08-01 DIAGNOSIS — G14 Postpolio syndrome: Secondary | ICD-10-CM | POA: Diagnosis not present

## 2017-08-01 DIAGNOSIS — Z8673 Personal history of transient ischemic attack (TIA), and cerebral infarction without residual deficits: Secondary | ICD-10-CM | POA: Diagnosis not present

## 2017-08-01 DIAGNOSIS — Z7901 Long term (current) use of anticoagulants: Secondary | ICD-10-CM | POA: Diagnosis not present

## 2017-08-01 DIAGNOSIS — I482 Chronic atrial fibrillation: Secondary | ICD-10-CM | POA: Diagnosis not present

## 2017-08-16 DIAGNOSIS — E782 Mixed hyperlipidemia: Secondary | ICD-10-CM | POA: Diagnosis not present

## 2017-08-16 DIAGNOSIS — I10 Essential (primary) hypertension: Secondary | ICD-10-CM | POA: Diagnosis not present

## 2017-08-20 DIAGNOSIS — I255 Ischemic cardiomyopathy: Secondary | ICD-10-CM | POA: Diagnosis not present

## 2017-08-20 DIAGNOSIS — D649 Anemia, unspecified: Secondary | ICD-10-CM | POA: Diagnosis not present

## 2017-08-20 DIAGNOSIS — R7301 Impaired fasting glucose: Secondary | ICD-10-CM | POA: Diagnosis not present

## 2017-08-20 DIAGNOSIS — G14 Postpolio syndrome: Secondary | ICD-10-CM | POA: Diagnosis not present

## 2017-08-20 DIAGNOSIS — E782 Mixed hyperlipidemia: Secondary | ICD-10-CM | POA: Diagnosis not present

## 2017-08-20 DIAGNOSIS — Z0000 Encounter for general adult medical examination without abnormal findings: Secondary | ICD-10-CM | POA: Diagnosis not present

## 2017-08-20 DIAGNOSIS — I10 Essential (primary) hypertension: Secondary | ICD-10-CM | POA: Diagnosis not present

## 2017-08-20 DIAGNOSIS — D696 Thrombocytopenia, unspecified: Secondary | ICD-10-CM | POA: Diagnosis not present

## 2017-08-20 DIAGNOSIS — M1990 Unspecified osteoarthritis, unspecified site: Secondary | ICD-10-CM | POA: Diagnosis not present

## 2017-08-29 DIAGNOSIS — Z8673 Personal history of transient ischemic attack (TIA), and cerebral infarction without residual deficits: Secondary | ICD-10-CM | POA: Diagnosis not present

## 2017-08-29 DIAGNOSIS — G14 Postpolio syndrome: Secondary | ICD-10-CM | POA: Diagnosis not present

## 2017-08-29 DIAGNOSIS — Z7901 Long term (current) use of anticoagulants: Secondary | ICD-10-CM | POA: Diagnosis not present

## 2017-08-29 DIAGNOSIS — I482 Chronic atrial fibrillation: Secondary | ICD-10-CM | POA: Diagnosis not present

## 2017-08-29 DIAGNOSIS — D649 Anemia, unspecified: Secondary | ICD-10-CM | POA: Diagnosis not present

## 2017-11-05 DIAGNOSIS — Z7901 Long term (current) use of anticoagulants: Secondary | ICD-10-CM | POA: Diagnosis not present

## 2017-11-05 DIAGNOSIS — Z8673 Personal history of transient ischemic attack (TIA), and cerebral infarction without residual deficits: Secondary | ICD-10-CM | POA: Diagnosis not present

## 2017-11-05 DIAGNOSIS — I482 Chronic atrial fibrillation: Secondary | ICD-10-CM | POA: Diagnosis not present

## 2017-11-05 DIAGNOSIS — Z6837 Body mass index (BMI) 37.0-37.9, adult: Secondary | ICD-10-CM | POA: Diagnosis not present

## 2017-11-07 DIAGNOSIS — R7301 Impaired fasting glucose: Secondary | ICD-10-CM | POA: Diagnosis not present

## 2017-11-07 DIAGNOSIS — D649 Anemia, unspecified: Secondary | ICD-10-CM | POA: Diagnosis not present

## 2017-11-07 DIAGNOSIS — I10 Essential (primary) hypertension: Secondary | ICD-10-CM | POA: Diagnosis not present

## 2017-11-07 DIAGNOSIS — I482 Chronic atrial fibrillation: Secondary | ICD-10-CM | POA: Diagnosis not present

## 2017-11-07 DIAGNOSIS — E782 Mixed hyperlipidemia: Secondary | ICD-10-CM | POA: Diagnosis not present

## 2017-11-10 ENCOUNTER — Encounter: Payer: Self-pay | Admitting: Cardiology

## 2017-11-10 NOTE — Progress Notes (Signed)
Cardiology Office Note  Date: 11/11/2017   ID: Benjamin Cordova, DOB 10-Nov-1934, MRN [id number]  PCP: Celene Squibb, MD  Primary Cardiologist: Rozann Lesches, MD   Chief Complaint  Patient presents with  . Coronary Artery Disease    History of Present Illness: Benjamin Cordova is an 83 y.o. male last seen in January.  He is here for a routine follow-up visit.  He reports no angina symptoms, has chronic fatigue and stable dyspnea on exertion.  He continues on Coumadin with follow-up per Dr. Nevada Crane.  He reports no bleeding episodes.  We went over his medications, he has increased HCTZ to 50 mg daily since last assessment, states that this improved leg swelling and shortness of breath.  His blood pressure has been running lower however.  I personally reviewed his ECG today which shows a sinus rhythm with old anterior infarct pattern and nonspecific ST-T changes.  Last cardiac structural and ischemic testing was in 2017 as outlined below.  Past Medical History:  Diagnosis Date  . Arthritis   . Coronary artery disease    Multivessel s/p CABG 1981 at [facility]  . Essential hypertension   . Hematuria   . History of blood transfusion 07/2015   Knee replacement HgB <9  . History of pneumonia   . History of stroke   . Mixed hyperlipidemia   . Myocardial infarction ([place]) 1980  . Peripheral vascular disease ([place])   . 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 Skin cancer   . TIA (transient ischemic attack) 1989   Chronic coumadin  . UTI (lower urinary tract infection)     Past Surgical History:  Procedure Laterality Date  . CARDIAC CATHETERIZATION N/A 10/05/2014   Procedure: Left Heart Cath and Coronary Angiography;  Surgeon: Peter M Martinique, MD;  Location: [place] [facility];  Service: Cardiovascular;  Laterality: N/A;  . CORONARY ARTERY BYPASS GRAFT  1981   [facility]  . CORONARY ARTERY BYPASS GRAFT N/A 10/08/2014   Procedure: REDO CORONARY ARTERY BYPASS  GRAFTING (CABG) Times Two Grafts using Left Internal Mammary and Left Radial Artery;  Surgeon: Melrose Nakayama, MD;  Location: [place];  Service: Open Heart Surgery;  Laterality: N/A;  . CYSTOSCOPY WITH INJECTION  10/10/2010   Procedure: CYSTOSCOPY WITH INJECTION;  Surgeon: Marissa Nestle;  [facility]: [facility];  Service: Urology;  Laterality: N/A;  with retrograde urethrogram  . FOOT SURGERY Right 1954   [facility], muscle implantation  . JOINT REPLACEMENT    . RADIAL ARTERY HARVEST Left 10/08/2014   Procedure: Left RADIAL ARTERY HARVEST;  Surgeon: Melrose Nakayama, MD;  Location: [place];  Service: Open Heart Surgery;  Laterality: Left;  . TEE WITHOUT CARDIOVERSION N/A 10/08/2014   Procedure: TRANSESOPHAGEAL ECHOCARDIOGRAM (TEE);  Surgeon: Melrose Nakayama, MD;  Location: [place];  Service: Open Heart Surgery;  Laterality: N/A;  . TONSILLECTOMY  1949   [facility]  . TOTAL KNEE ARTHROPLASTY Left 07/27/2015   Procedure: TOTAL LEFT KNEE ARTHROPLASTY;  Surgeon: [name], MD;  Location: [place];  Service: Orthopedics;  Laterality: Left;    Current Outpatient Medications  Medication Sig Dispense Refill  . B Complex-C-Folic Acid (SUPER B-COMPLEX/VIT C/FA) TABS Take 1 tablet by mouth daily.      . carvedilol (COREG) 12.5 MG tablet TAKE 1 TABLET TWICE DAILY  (STOP  ATENOLOL) 180 tablet 3  . diphenhydramine-acetaminophen (TYLENOL PM) 25-500 MG TABS tablet Take 2 tablets  by mouth at bedtime as needed.    . folic acid (FOLVITE) 956 MCG tablet Take 400 mcg by mouth daily.     . hydrochlorothiazide (HYDRODIURIL) 25 MG tablet Take 50 mg by mouth daily.     Marland Kitchen lisinopril (PRINIVIL,ZESTRIL) 5 MG tablet Take 1 tablet (5 mg total) by mouth daily. 90 tablet 3  . Multiple Vitamins-Minerals (CERTAVITE/ANTIOXIDANTS) TABS Take 1 tablet by mouth daily.     . polyethylene glycol (MIRALAX / GLYCOLAX) packet Take 17 g by mouth daily. 14 each 0  . Saw Palmetto 500 MG CAPS Take 1 capsule by mouth 2 (two) times daily.    .  simvastatin (ZOCOR) 40 MG tablet Take 40 mg by mouth daily.    . vitamin C (ASCORBIC ACID) 500 MG tablet Take 500 mg by mouth daily.      Marland Kitchen warfarin (COUMADIN) 5 MG tablet Take 1.5 tablets (7.5 mg total) by mouth daily at 6 PM. OR AS DIRECTED by MD. (Patient taking differently: Take 7.5 mg by mouth daily at 6 PM. MANAGED MY PMD) 60 tablet 0   No current facility-administered medications for this visit.    Allergies:  Patient has no known allergies.   Social History: The patient  reports that he quit smoking about 39 years ago. His smoking use included cigarettes. He has a 28.00 pack-year smoking history. He has never used smokeless tobacco. He reports that he does not drink alcohol or use drugs.   ROS:  Please see the history of present illness. Otherwise, complete review of systems is positive for none.  All other systems are reviewed and negative.   Physical Exam: VS:  BP 122/62 (BP Location: Right Arm, Patient Position: Sitting)   Pulse (!) 101   Ht 5\' 8"  (1.727 m)   Wt 243 lb (110.2 kg)   SpO2 93%   BMI 36.95 kg/m , BMI Body mass index is 36.95 kg/m.  Wt Readings from Last 3 Encounters:  11/11/17 243 lb (110.2 kg)  04/16/17 245 lb 3.2 oz (111.2 kg)  09/13/16 239 lb 12.8 oz (108.8 kg)    General: Elderly male, appears comfortable at rest.  Using a cane. HEENT: Conjunctiva and lids normal, oropharynx clear. Neck: Supple, no elevated JVP or carotid bruits, no thyromegaly. Lungs: Scattered rhonchi without wheezing, nonlabored breathing at rest. Cardiac: Regular rate and rhythm, no S3, soft systolic murmur. Abdomen: Soft, nontender, bowel sounds present. Extremities: Mild lower leg edema, distal pulses 2+. Skin: Warm and dry. Musculoskeletal: No kyphosis. Neuropsychiatric: Alert and oriented x3, affect grossly appropriate.  ECG: I personally reviewed the tracing from 09/13/2016 which showed sinus rhythm with PVC and fusion beat, old anterior infarct pattern, diffuse nonspecific  ST-T changes.  Recent Labwork:    Component Value Date/Time   CHOL 118 (L) 03/05/2015 0834   TRIG 104 03/05/2015 0834   HDL 39 (L) 03/05/2015 0834   CHOLHDL 3.0 03/05/2015 0834   VLDL 21 03/05/2015 0834   LDLCALC 58 03/05/2015 0834    Other Studies Reviewed Today:  Echocardiogram 11/29/2015: Study Conclusions  - Left ventricle: Systolic function was moderately reduced. The estimated ejection fraction was in the range of 35% to 40%. Left ventricular diastolic function parameters were normal. - Mitral valve: There was mild regurgitation. - Left atrium: The atrium was moderately dilated. - Atrial septum: No defect or patent foramen ovale was identified.  Lexiscan Myoview 09/03/2015: IMPRESSION: 1. Large fixed defect at the apex extending into the anterior and inferior walls. Concern for peri-infarct  ischemia in the anterior wall and lower septum.  2. Severe diffuse hypokinesia with akinesia in the areas of scar.  3. Left ventricular ejection fraction 26%  4. Non invasive risk stratification*: High risk   Assessment and Plan:  1.  Multivessel CAD status post CABG with redo operation in 2016.  Ischemic and structural cardiac testing from 2017 is outlined above.  We will continue with medical therapy after discussion, he prefers to hold off on invasive testing.  2.  Ischemic cardiomyopathy with LVEF 35 to 40%.  He has elected not to pursue ICD.  3.  Mixed hyperlipidemia, remains on Zocor with follow-up per Dr. Nevada Crane.  He anticipates physical with lab work in November.  4.  Low normal to low blood pressures, reduce lisinopril to 5 mg daily.  Current medicines were reviewed with the patient today.   Orders Placed This Encounter  Procedures  . EKG 12-Lead    Disposition: Follow-up in 6 months.  Signed, Satira Sark, MD, [facility] 11/11/2017 2:36 PM     [facility] [address], [place], [place] [address] Phone: [phone]; Fax: [phone]

## 2017-11-11 ENCOUNTER — Ambulatory Visit: Payer: Medicare HMO | Admitting: Cardiology

## 2017-11-11 ENCOUNTER — Encounter: Payer: Self-pay | Admitting: Cardiology

## 2017-11-11 VITALS — BP 122/62 | HR 101 | Ht 68.0 in | Wt 243.0 lb

## 2017-11-11 DIAGNOSIS — E782 Mixed hyperlipidemia: Secondary | ICD-10-CM | POA: Diagnosis not present

## 2017-11-11 DIAGNOSIS — I255 Ischemic cardiomyopathy: Secondary | ICD-10-CM | POA: Diagnosis not present

## 2017-11-11 DIAGNOSIS — I25119 Atherosclerotic heart disease of native coronary artery with unspecified angina pectoris: Secondary | ICD-10-CM | POA: Diagnosis not present

## 2017-11-11 MED ORDER — LISINOPRIL 5 MG PO TABS: 5.0000 mg | ORAL_TABLET | Freq: Every day | ORAL | 3 refills | Status: DC

## 2017-11-11 NOTE — Patient Instructions (Signed)
Your physician wants you to follow-up in:  6 months with Dr.McDowell You will receive a reminder letter in the mail two months in advance. If you don't receive a letter, please call our office to schedule the follow-up appointment.      DECREASE lisinopril to 5 mg daily   All other meds stay the same     No lab work or tests today.      Thank you for choosing [facility] !

## 2017-12-04 DIAGNOSIS — I482 Chronic atrial fibrillation: Secondary | ICD-10-CM | POA: Diagnosis not present

## 2017-12-04 DIAGNOSIS — Z8673 Personal history of transient ischemic attack (TIA), and cerebral infarction without residual deficits: Secondary | ICD-10-CM | POA: Diagnosis not present

## 2017-12-04 DIAGNOSIS — Z7901 Long term (current) use of anticoagulants: Secondary | ICD-10-CM | POA: Diagnosis not present

## 2017-12-19 DIAGNOSIS — E782 Mixed hyperlipidemia: Secondary | ICD-10-CM | POA: Diagnosis not present

## 2017-12-19 DIAGNOSIS — M1990 Unspecified osteoarthritis, unspecified site: Secondary | ICD-10-CM | POA: Diagnosis not present

## 2017-12-19 DIAGNOSIS — I10 Essential (primary) hypertension: Secondary | ICD-10-CM | POA: Diagnosis not present

## 2017-12-19 DIAGNOSIS — R7301 Impaired fasting glucose: Secondary | ICD-10-CM | POA: Diagnosis not present

## 2017-12-19 DIAGNOSIS — I482 Chronic atrial fibrillation: Secondary | ICD-10-CM | POA: Diagnosis not present

## 2017-12-31 DIAGNOSIS — Z7901 Long term (current) use of anticoagulants: Secondary | ICD-10-CM | POA: Diagnosis not present

## 2018-01-07 DIAGNOSIS — E782 Mixed hyperlipidemia: Secondary | ICD-10-CM | POA: Diagnosis not present

## 2018-01-07 DIAGNOSIS — N400 Enlarged prostate without lower urinary tract symptoms: Secondary | ICD-10-CM | POA: Diagnosis not present

## 2018-01-07 DIAGNOSIS — Z0000 Encounter for general adult medical examination without abnormal findings: Secondary | ICD-10-CM | POA: Diagnosis not present

## 2018-01-07 DIAGNOSIS — I10 Essential (primary) hypertension: Secondary | ICD-10-CM | POA: Diagnosis not present

## 2018-01-07 DIAGNOSIS — I255 Ischemic cardiomyopathy: Secondary | ICD-10-CM | POA: Diagnosis not present

## 2018-01-07 DIAGNOSIS — R7301 Impaired fasting glucose: Secondary | ICD-10-CM | POA: Diagnosis not present

## 2018-01-07 DIAGNOSIS — Z6837 Body mass index (BMI) 37.0-37.9, adult: Secondary | ICD-10-CM | POA: Diagnosis not present

## 2018-01-07 DIAGNOSIS — I482 Chronic atrial fibrillation, unspecified: Secondary | ICD-10-CM | POA: Diagnosis not present

## 2018-01-07 DIAGNOSIS — G14 Postpolio syndrome: Secondary | ICD-10-CM | POA: Diagnosis not present

## 2018-01-28 DIAGNOSIS — Z23 Encounter for immunization: Secondary | ICD-10-CM | POA: Diagnosis not present

## 2018-01-28 DIAGNOSIS — Z7901 Long term (current) use of anticoagulants: Secondary | ICD-10-CM | POA: Diagnosis not present

## 2018-02-20 DIAGNOSIS — I255 Ischemic cardiomyopathy: Secondary | ICD-10-CM | POA: Diagnosis not present

## 2018-02-20 DIAGNOSIS — D649 Anemia, unspecified: Secondary | ICD-10-CM | POA: Diagnosis not present

## 2018-02-20 DIAGNOSIS — E782 Mixed hyperlipidemia: Secondary | ICD-10-CM | POA: Diagnosis not present

## 2018-02-20 DIAGNOSIS — M1990 Unspecified osteoarthritis, unspecified site: Secondary | ICD-10-CM | POA: Diagnosis not present

## 2018-02-20 DIAGNOSIS — Z8673 Personal history of transient ischemic attack (TIA), and cerebral infarction without residual deficits: Secondary | ICD-10-CM | POA: Diagnosis not present

## 2018-02-20 DIAGNOSIS — I10 Essential (primary) hypertension: Secondary | ICD-10-CM | POA: Diagnosis not present

## 2018-02-20 DIAGNOSIS — D696 Thrombocytopenia, unspecified: Secondary | ICD-10-CM | POA: Diagnosis not present

## 2018-02-20 DIAGNOSIS — Z0000 Encounter for general adult medical examination without abnormal findings: Secondary | ICD-10-CM | POA: Diagnosis not present

## 2018-02-20 DIAGNOSIS — Z7901 Long term (current) use of anticoagulants: Secondary | ICD-10-CM | POA: Diagnosis not present

## 2018-02-25 DIAGNOSIS — D631 Anemia in chronic kidney disease: Secondary | ICD-10-CM | POA: Diagnosis not present

## 2018-02-25 DIAGNOSIS — I255 Ischemic cardiomyopathy: Secondary | ICD-10-CM | POA: Diagnosis not present

## 2018-02-25 DIAGNOSIS — E782 Mixed hyperlipidemia: Secondary | ICD-10-CM | POA: Diagnosis not present

## 2018-02-25 DIAGNOSIS — M1990 Unspecified osteoarthritis, unspecified site: Secondary | ICD-10-CM | POA: Diagnosis not present

## 2018-02-25 DIAGNOSIS — I10 Essential (primary) hypertension: Secondary | ICD-10-CM | POA: Diagnosis not present

## 2018-02-25 DIAGNOSIS — R7301 Impaired fasting glucose: Secondary | ICD-10-CM | POA: Diagnosis not present

## 2018-02-25 DIAGNOSIS — Z96652 Presence of left artificial knee joint: Secondary | ICD-10-CM | POA: Diagnosis not present

## 2018-02-25 DIAGNOSIS — G14 Postpolio syndrome: Secondary | ICD-10-CM | POA: Diagnosis not present

## 2018-02-25 DIAGNOSIS — N400 Enlarged prostate without lower urinary tract symptoms: Secondary | ICD-10-CM | POA: Diagnosis not present

## 2018-03-11 ENCOUNTER — Other Ambulatory Visit (HOSPITAL_COMMUNITY): Payer: Self-pay | Admitting: Internal Medicine

## 2018-03-11 DIAGNOSIS — I10 Essential (primary) hypertension: Secondary | ICD-10-CM | POA: Diagnosis not present

## 2018-03-11 DIAGNOSIS — I255 Ischemic cardiomyopathy: Secondary | ICD-10-CM | POA: Diagnosis not present

## 2018-03-11 DIAGNOSIS — R0602 Shortness of breath: Secondary | ICD-10-CM

## 2018-03-11 DIAGNOSIS — S0101XA Laceration without foreign body of scalp, initial encounter: Secondary | ICD-10-CM | POA: Diagnosis not present

## 2018-03-11 DIAGNOSIS — D638 Anemia in other chronic diseases classified elsewhere: Secondary | ICD-10-CM | POA: Diagnosis not present

## 2018-03-11 DIAGNOSIS — R7301 Impaired fasting glucose: Secondary | ICD-10-CM | POA: Diagnosis not present

## 2018-03-18 ENCOUNTER — Ambulatory Visit (HOSPITAL_COMMUNITY)
Admission: RE | Admit: 2018-03-18 | Discharge: 2018-03-18 | Disposition: A | Discharge status: Home / Self Care | Payer: Medicare HMO | Source: Ambulatory Visit | Attending: Internal Medicine | Admitting: Internal Medicine

## 2018-03-18 DIAGNOSIS — R0602 Shortness of breath: Secondary | ICD-10-CM | POA: Insufficient documentation

## 2018-03-18 NOTE — Progress Notes (Signed)
*  PRELIMINARY RESULTS* Echocardiogram 2D Echocardiogram has been performed.  Benjamin Cordova 03/18/2018, 11:20 AM

## 2018-04-04 DIAGNOSIS — I5030 Unspecified diastolic (congestive) heart failure: Secondary | ICD-10-CM | POA: Diagnosis not present

## 2018-04-04 DIAGNOSIS — I255 Ischemic cardiomyopathy: Secondary | ICD-10-CM | POA: Diagnosis not present

## 2018-04-04 DIAGNOSIS — D638 Anemia in other chronic diseases classified elsewhere: Secondary | ICD-10-CM | POA: Diagnosis not present

## 2018-04-04 DIAGNOSIS — I10 Essential (primary) hypertension: Secondary | ICD-10-CM | POA: Diagnosis not present

## 2018-04-18 DIAGNOSIS — R3914 Feeling of incomplete bladder emptying: Secondary | ICD-10-CM | POA: Diagnosis not present

## 2018-04-18 DIAGNOSIS — N401 Enlarged prostate with lower urinary tract symptoms: Secondary | ICD-10-CM | POA: Diagnosis not present

## 2018-04-18 DIAGNOSIS — S90851A Superficial foreign body, right foot, initial encounter: Secondary | ICD-10-CM | POA: Diagnosis not present

## 2018-04-18 DIAGNOSIS — Z125 Encounter for screening for malignant neoplasm of prostate: Secondary | ICD-10-CM | POA: Diagnosis not present

## 2018-04-25 DIAGNOSIS — I255 Ischemic cardiomyopathy: Secondary | ICD-10-CM | POA: Diagnosis not present

## 2018-04-25 DIAGNOSIS — I10 Essential (primary) hypertension: Secondary | ICD-10-CM | POA: Diagnosis not present

## 2018-05-12 ENCOUNTER — Ambulatory Visit: Payer: Medicare HMO | Admitting: Cardiology

## 2018-05-23 DIAGNOSIS — I255 Ischemic cardiomyopathy: Secondary | ICD-10-CM | POA: Diagnosis not present

## 2018-06-05 NOTE — Progress Notes (Signed)
Cardiology Office Note  Date: 06/06/2018   ID: Benjamin Cordova, DOB [birth year], MRN [id number]  PCP: Celene Squibb, MD  Primary Cardiologist: Rozann Lesches, MD   Chief Complaint  Patient presents with  . Coronary Artery Disease    History of Present Illness: Benjamin Cordova is an 84 y.o. male last seen in August 2019.  He presents for a routine follow-up visit.  He does not report any angina symptoms, but states that he was seen in in December of last year by PCP with increasing shortness of breath.  Follow-up echocardiogram at that time revealed LVEF 40 to 50% range which was actually improved compared to prior assessment in 2017.  He was placed on low-dose Lasix with potassium supplements and is feeling better.  He is on Coumadin with follow-up by Dr. Nevada Crane.  He does not report any active bleeding problems.  Current cardiac regimen includes Coreg, Lasix, potassium supplements, lisinopril, and Zocor.  Past Medical History:  Diagnosis Date  . Arthritis   . Coronary artery disease    Multivessel s/p CABG 1981 at [facility]  . Essential hypertension   . Hematuria   . History of blood transfusion 07/2015   Knee replacement HgB <9  . History of pneumonia   . History of stroke   . Mixed hyperlipidemia   . Myocardial infarction ([place]) 1980  . Peripheral vascular disease ([place])   . Polio osteopathy of lower leg ([place]) Age 75   Affected right leg  . Skin cancer   . TIA (transient ischemic attack) 1989   Chronic coumadin  . UTI (lower urinary tract infection)     Past Surgical History:  Procedure Laterality Date  . CARDIAC CATHETERIZATION N/A 10/05/2014   Procedure: Left Heart Cath and Coronary Angiography;  Surgeon: Peter M Martinique, MD;  Location: [place] [facility];  Service: Cardiovascular;  Laterality: N/A;  . CORONARY ARTERY BYPASS GRAFT  1981   [facility]  . CORONARY ARTERY BYPASS GRAFT N/A 10/08/2014   Procedure: REDO CORONARY ARTERY BYPASS GRAFTING (CABG) Times Two Grafts using  Left Internal Mammary and Left Radial Artery;  Surgeon: Melrose Nakayama, MD;  Location: [place];  Service: Open Heart Surgery;  Laterality: N/A;  . CYSTOSCOPY WITH INJECTION  10/10/2010   Procedure: CYSTOSCOPY WITH INJECTION;  Surgeon: Marissa Nestle;  [facility];  Service: Urology;  Laterality: N/A;  with retrograde urethrogram  . FOOT SURGERY Right 1954   [facility], muscle implantation  . JOINT REPLACEMENT    . RADIAL ARTERY HARVEST Left 10/08/2014   Procedure: Left RADIAL ARTERY HARVEST;  Surgeon: Melrose Nakayama, MD;  Location: [place];  Service: Open Heart Surgery;  Laterality: Left;  . TEE WITHOUT CARDIOVERSION N/A 10/08/2014   Procedure: TRANSESOPHAGEAL ECHOCARDIOGRAM (TEE);  Surgeon: Melrose Nakayama, MD;  Location: [place];  Service: Open Heart Surgery;  Laterality: N/A;  . TONSILLECTOMY  1949   [facility]  . TOTAL KNEE ARTHROPLASTY Left 07/27/2015   Procedure: TOTAL LEFT KNEE ARTHROPLASTY;  Surgeon: [name], MD;  Location: [place];  Service: Orthopedics;  Laterality: Left;    Current Outpatient Medications  Medication Sig Dispense Refill  . B Complex-C-Folic Acid (SUPER B-COMPLEX/VIT C/FA) TABS Take 1 tablet by mouth daily.      . carvedilol (COREG) 12.5 MG tablet TAKE 1 TABLET TWICE DAILY  (STOP  ATENOLOL) 180 tablet 3  . diphenhydramine-acetaminophen (TYLENOL PM) 25-500 MG TABS tablet Take 2 tablets by mouth at bedtime as needed.    Marland Kitchen  folic acid (FOLVITE) 151 MCG tablet Take 400 mcg by mouth daily.     . furosemide (LASIX) 20 MG tablet TAKE 1 TABLET BY MOUTH ONCE DAILY FOR HEART    . hydrochlorothiazide (HYDRODIURIL) 25 MG tablet Take 50 mg by mouth daily.     Marland Kitchen lisinopril (PRINIVIL,ZESTRIL) 5 MG tablet Take 1 tablet (5 mg total) by mouth daily. 90 tablet 3  . Multiple Vitamins-Minerals (CERTAVITE/ANTIOXIDANTS) TABS Take 1 tablet by mouth daily.     . polyethylene glycol (MIRALAX / GLYCOLAX) packet Take 17 g by mouth daily. 14 each 0  . potassium chloride (K-DUR) 10 MEQ  tablet TAKE BY MOUTH DAILY WITH FLUID PILL    . Saw Palmetto 500 MG CAPS Take 1 capsule by mouth 2 (two) times daily.    . simvastatin (ZOCOR) 80 MG tablet Take 80 mg by mouth daily.    . vitamin C (ASCORBIC ACID) 500 MG tablet Take 500 mg by mouth daily.      Marland Kitchen warfarin (COUMADIN) 5 MG tablet Take 1.5 tablets (7.5 mg total) by mouth daily at 6 PM. OR AS DIRECTED by MD. (Patient taking differently: Take 7.5 mg by mouth daily at 6 PM. MANAGED MY PMD) 60 tablet 0   No current facility-administered medications for this visit.    Allergies:  Patient has no known allergies.   Social History: The patient  reports that he quit smoking about 39 years ago. His smoking use included cigarettes. He has a 28.00 pack-year smoking history. He has never used smokeless tobacco. He reports that he does not drink alcohol or use drugs.   ROS:  Please see the history of present illness. Otherwise, complete review of systems is positive for hearing loss, chronic right leg weakness.  All other systems are reviewed and negative.   Physical Exam: VS:  BP (!) 102/50   Pulse 88   Ht 5\' 8"  (1.727 m)   Wt 247 lb (112 kg)   SpO2 95%   BMI 37.56 kg/m , BMI Body mass index is 37.56 kg/m.  Wt Readings from Last 3 Encounters:  06/06/18 247 lb (112 kg)  11/11/17 243 lb (110.2 kg)  04/16/17 245 lb 3.2 oz (111.2 kg)    General: Elderly male, appears comfortable at rest.  Cane. HEENT: Conjunctiva and lids normal, oropharynx clear. Neck: Supple, no elevated JVP or carotid bruits, no thyromegaly. Lungs: Clear to auscultation, nonlabored breathing at rest. Cardiac: Regular rate and rhythm, no S3, soft systolic murmur. Abdomen: Soft, nontender, bowel sounds present. Extremities: Stable, mild lower leg edema edema, distal pulses 1-2+.  Brace on right lower leg. Skin: Warm and dry. Musculoskeletal: No kyphosis. Neuropsychiatric: Alert and oriented x3, affect grossly appropriate.  ECG: I personally reviewed the tracing  from 11/11/2017 which showed sinus rhythm with old anterior infarct pattern and nonspecific ST-T changes.  Recent Labwork:    Component Value Date/Time   CHOL 118 (L) 03/05/2015 0834   TRIG 104 03/05/2015 0834   HDL 39 (L) 03/05/2015 0834   CHOLHDL 3.0 03/05/2015 0834   VLDL 21 03/05/2015 0834   LDLCALC 58 03/05/2015 0834    Other Studies Reviewed Today:  Echocardiogram 03/18/2018: Study Conclusions  - Left ventricle: The cavity size was normal. Wall thickness was   increased in a pattern of mild LVH. Systolic function was normal.   The estimated ejection fraction was in the range of 40% to 50%. - Aortic valve: Mildly calcified annulus. Trileaflet; mildly   thickened leaflets. Valve area (VTI):  2.24 cm^2. Valve area   (Vmax): 1.91 cm^2. Valve area (Vmean): 1.68 cm^2. - Mitral valve: Mildly calcified annulus. Mildly thickened leaflets   . - Left atrium: The atrium was moderately dilated. - Technically difficult study, particularly the apical views.   Recommend echocontrast to better evaluate LVEF. Grossly LVEF   apepars 40 to 50%.  Lexiscan Myoview 09/03/2015: IMPRESSION: 1. Large fixed defect at the apex extending into the anterior and inferior walls. Concern for peri-infarct ischemia in the anterior wall and lower septum.  2. Severe diffuse hypokinesia with akinesia in the areas of scar.  3. Left ventricular ejection fraction 26%  4. Non invasive risk stratification*: High risk   Assessment and Plan:  1.  Multivessel CAD status post CABG with redo operation in 2016.  He does not report any active angina symptoms at this time and we will plan to continue aspirin and statin therapy.  2.  Ischemic cardiomyopathy, most recent LVEF improved to the range of 40 to 50% as of December 2019.  Continue Coreg, lisinopril, and Lasix with potassium supplements.  3.  Mixed hyperlipidemia, continues on Zocor.  Dose was recently increased by PCP.  4.  History of hypertension.   Blood pressure low normal range.  No changes made to current regimen.  Current medicines were reviewed with the patient today.  Disposition: Follow-up in 6 months.  Signed, Satira Sark, MD, [facility] 06/06/2018 1:34 PM    [facility] at [facility] [address], [place], [place] [address] Phone: [phone]; Fax: [phone]

## 2018-06-06 ENCOUNTER — Ambulatory Visit: Payer: Medicare HMO | Admitting: Cardiology

## 2018-06-06 ENCOUNTER — Encounter: Payer: Self-pay | Admitting: Cardiology

## 2018-06-06 VITALS — BP 102/50 | HR 88 | Ht 68.0 in | Wt 247.0 lb

## 2018-06-06 DIAGNOSIS — I255 Ischemic cardiomyopathy: Secondary | ICD-10-CM | POA: Diagnosis not present

## 2018-06-06 DIAGNOSIS — I25119 Atherosclerotic heart disease of native coronary artery with unspecified angina pectoris: Secondary | ICD-10-CM | POA: Diagnosis not present

## 2018-06-06 DIAGNOSIS — I10 Essential (primary) hypertension: Secondary | ICD-10-CM | POA: Diagnosis not present

## 2018-06-06 DIAGNOSIS — E782 Mixed hyperlipidemia: Secondary | ICD-10-CM | POA: Diagnosis not present

## 2018-06-06 NOTE — Patient Instructions (Signed)
Medication Instructions: Your physician recommends that you continue on your current medications as directed. Please refer to the Current Medication list given to you today.   Labwork: None today  Procedures/Testing: None today  Follow-Up: 6 months with Dr.McDowell  Any Additional Special Instructions Will Be Listed Below (If Applicable).     If you need a refill on your cardiac medications before your next appointment, please call your pharmacy.   

## 2018-06-20 DIAGNOSIS — I255 Ischemic cardiomyopathy: Secondary | ICD-10-CM | POA: Diagnosis not present

## 2018-06-20 DIAGNOSIS — Z7901 Long term (current) use of anticoagulants: Secondary | ICD-10-CM | POA: Diagnosis not present

## 2018-06-27 DIAGNOSIS — I5030 Unspecified diastolic (congestive) heart failure: Secondary | ICD-10-CM | POA: Diagnosis not present

## 2018-06-27 DIAGNOSIS — D638 Anemia in other chronic diseases classified elsewhere: Secondary | ICD-10-CM | POA: Diagnosis not present

## 2018-06-27 DIAGNOSIS — D631 Anemia in chronic kidney disease: Secondary | ICD-10-CM | POA: Diagnosis not present

## 2018-06-27 DIAGNOSIS — D649 Anemia, unspecified: Secondary | ICD-10-CM | POA: Diagnosis not present

## 2018-06-27 DIAGNOSIS — N400 Enlarged prostate without lower urinary tract symptoms: Secondary | ICD-10-CM | POA: Diagnosis not present

## 2018-06-27 DIAGNOSIS — Z0000 Encounter for general adult medical examination without abnormal findings: Secondary | ICD-10-CM | POA: Diagnosis not present

## 2018-06-27 DIAGNOSIS — N183 Chronic kidney disease, stage 3 (moderate): Secondary | ICD-10-CM | POA: Diagnosis not present

## 2018-06-27 DIAGNOSIS — Z96652 Presence of left artificial knee joint: Secondary | ICD-10-CM | POA: Diagnosis not present

## 2018-06-27 DIAGNOSIS — R0602 Shortness of breath: Secondary | ICD-10-CM | POA: Diagnosis not present

## 2018-06-27 DIAGNOSIS — Z8673 Personal history of transient ischemic attack (TIA), and cerebral infarction without residual deficits: Secondary | ICD-10-CM | POA: Diagnosis not present

## 2018-07-01 DIAGNOSIS — R7301 Impaired fasting glucose: Secondary | ICD-10-CM | POA: Diagnosis not present

## 2018-07-01 DIAGNOSIS — M1990 Unspecified osteoarthritis, unspecified site: Secondary | ICD-10-CM | POA: Diagnosis not present

## 2018-07-01 DIAGNOSIS — G14 Postpolio syndrome: Secondary | ICD-10-CM | POA: Diagnosis not present

## 2018-07-01 DIAGNOSIS — E785 Hyperlipidemia, unspecified: Secondary | ICD-10-CM | POA: Diagnosis not present

## 2018-07-01 DIAGNOSIS — I255 Ischemic cardiomyopathy: Secondary | ICD-10-CM | POA: Diagnosis not present

## 2018-07-01 DIAGNOSIS — D509 Iron deficiency anemia, unspecified: Secondary | ICD-10-CM | POA: Diagnosis not present

## 2018-07-01 DIAGNOSIS — N182 Chronic kidney disease, stage 2 (mild): Secondary | ICD-10-CM | POA: Diagnosis not present

## 2018-07-01 DIAGNOSIS — D696 Thrombocytopenia, unspecified: Secondary | ICD-10-CM | POA: Diagnosis not present

## 2018-07-01 DIAGNOSIS — I10 Essential (primary) hypertension: Secondary | ICD-10-CM | POA: Diagnosis not present

## 2018-07-30 DIAGNOSIS — D225 Melanocytic nevi of trunk: Secondary | ICD-10-CM | POA: Diagnosis not present

## 2018-07-30 DIAGNOSIS — B078 Other viral warts: Secondary | ICD-10-CM | POA: Diagnosis not present

## 2018-08-01 DIAGNOSIS — Z7901 Long term (current) use of anticoagulants: Secondary | ICD-10-CM | POA: Diagnosis not present

## 2018-08-01 DIAGNOSIS — I255 Ischemic cardiomyopathy: Secondary | ICD-10-CM | POA: Diagnosis not present

## 2018-08-29 DIAGNOSIS — I255 Ischemic cardiomyopathy: Secondary | ICD-10-CM | POA: Diagnosis not present

## 2018-08-29 DIAGNOSIS — Z7901 Long term (current) use of anticoagulants: Secondary | ICD-10-CM | POA: Diagnosis not present

## 2018-09-26 DIAGNOSIS — I255 Ischemic cardiomyopathy: Secondary | ICD-10-CM | POA: Diagnosis not present

## 2018-09-26 DIAGNOSIS — Z7901 Long term (current) use of anticoagulants: Secondary | ICD-10-CM | POA: Diagnosis not present

## 2018-09-26 DIAGNOSIS — R5383 Other fatigue: Secondary | ICD-10-CM | POA: Diagnosis not present

## 2018-10-07 ENCOUNTER — Ambulatory Visit: Payer: Medicare HMO | Admitting: Cardiology

## 2018-10-07 ENCOUNTER — Encounter: Payer: Self-pay | Admitting: Cardiology

## 2018-10-07 ENCOUNTER — Other Ambulatory Visit: Payer: Self-pay

## 2018-10-07 VITALS — BP 131/79 | HR 90 | Temp 98.0°F | Ht 68.0 in | Wt 239.0 lb

## 2018-10-07 DIAGNOSIS — I10 Essential (primary) hypertension: Secondary | ICD-10-CM | POA: Diagnosis not present

## 2018-10-07 DIAGNOSIS — I5022 Chronic systolic (congestive) heart failure: Secondary | ICD-10-CM | POA: Diagnosis not present

## 2018-10-07 DIAGNOSIS — I255 Ischemic cardiomyopathy: Secondary | ICD-10-CM | POA: Diagnosis not present

## 2018-10-07 DIAGNOSIS — I25119 Atherosclerotic heart disease of native coronary artery with unspecified angina pectoris: Secondary | ICD-10-CM | POA: Diagnosis not present

## 2018-10-07 DIAGNOSIS — E782 Mixed hyperlipidemia: Secondary | ICD-10-CM | POA: Diagnosis not present

## 2018-10-07 MED ORDER — ENTRESTO 24-26 MG PO TABS: 1.0000 | ORAL_TABLET | Freq: Two times a day (BID) | ORAL | 6 refills | Status: DC

## 2018-10-07 NOTE — Progress Notes (Signed)
Cardiology Office Note  Date: 10/07/2018   ID: Benjamin Cordova, DOB 06/08/1934, MRN [id number]  PCP:  Celene Squibb, MD  Cardiologist:  Rozann Lesches, MD Electrophysiologist:  None   Chief Complaint  Patient presents with   Coronary Artery Disease    History of Present Illness: Benjamin Cordova is an 84 y.o. male last seen in March.  He is referred back to the office by Dr. Nevada Crane.  He reports dyspnea on exertion and lack of stamina, fairly chronic overall, no obvious angina symptoms on medical therapy.  He has asked about switching from lisinopril to [facility].  His last echocardiogram did show improvement in LVEF however, reportedly in the range of 40% to potentially as high as 50%.  He also seems to have a pulmonary component to some shortness of breath with bronchial breath sounds and some chest congestion each morning when he gets up.  He has not had any progressive weight gain, in fact he is down about 8 pounds.  He is on Coumadin with follow-up by Dr. Nevada Crane.  Reports no bleeding problems.  I reviewed his lab work from March.  Past Medical History:  Diagnosis Date   Arthritis    Coronary artery disease    Multivessel s/p CABG 1981 at [facility] hypertension    Hematuria    History of blood transfusion 07/2015   Knee replacement HgB <9   History of pneumonia    History of stroke    Mixed hyperlipidemia    Myocardial infarction [facility]) 1980   Peripheral vascular disease ([place])    Polio osteopathy of lower leg ([place]) Age 81   Affected right leg   Skin cancer    TIA (transient ischemic attack) 1989   Chronic coumadin   UTI (lower urinary tract infection)     Past Surgical History:  Procedure Laterality Date   CARDIAC CATHETERIZATION N/A 10/05/2014   Procedure: Left Heart Cath and Coronary Angiography;  Surgeon: Peter M Martinique, MD;  Location: [facility];  Service: Cardiovascular;  Laterality: N/A;   CORONARY ARTERY BYPASS GRAFT  1981   [facility]  [place]   CORONARY ARTERY BYPASS GRAFT N/A 10/08/2014   Procedure: REDO CORONARY ARTERY BYPASS GRAFTING (CABG) Times Two Grafts using Left Internal Mammary and Left Radial Artery;  Surgeon: Melrose Nakayama, MD;  Location: [place];  Service: Open Heart Surgery;  Laterality: N/A;   CYSTOSCOPY WITH INJECTION  10/10/2010   Procedure: CYSTOSCOPY WITH INJECTION;  Surgeon: Marissa Nestle;  [facility];  Service: Urology;  Laterality: N/A;  with retrograde urethrogram   FOOT SURGERY Right 1954   [facility], muscle implantation   JOINT REPLACEMENT     RADIAL ARTERY HARVEST Left 10/08/2014   Procedure: Left RADIAL ARTERY HARVEST;  Surgeon: Melrose Nakayama, MD;  Location: [place];  Service: Open Heart Surgery;  Laterality: Left;   TEE WITHOUT CARDIOVERSION N/A 10/08/2014   Procedure: TRANSESOPHAGEAL ECHOCARDIOGRAM (TEE);  Surgeon: Melrose Nakayama, MD;  Location: [place];  Service: Open Heart Surgery;  Laterality: N/A;   TONSILLECTOMY  1949   [facility]   TOTAL KNEE ARTHROPLASTY Left 07/27/2015   Procedure: TOTAL LEFT KNEE ARTHROPLASTY;  Surgeon: [name], MD;  Location: [place];  Service: Orthopedics;  Laterality: Left;    Current Outpatient Medications  Medication Sig Dispense Refill   B Complex-C-Folic Acid (SUPER B-COMPLEX/VIT C/FA) TABS Take 1 tablet by mouth daily.       carvedilol (COREG) 12.5  MG tablet TAKE 1 TABLET TWICE DAILY  (STOP  ATENOLOL) 180 tablet 3   diphenhydramine-acetaminophen (TYLENOL PM) 25-500 MG TABS tablet Take 2 tablets by mouth at bedtime as needed.     folic acid (FOLVITE) 756 MCG tablet Take 400 mcg by mouth daily.      furosemide (LASIX) 20 MG tablet TAKE 1 TABLET BY MOUTH ONCE DAILY FOR HEART     hydrochlorothiazide (HYDRODIURIL) 25 MG tablet Take 50 mg by mouth daily.      Multiple Vitamins-Minerals (CERTAVITE/ANTIOXIDANTS) TABS Take 1 tablet by mouth daily.      polyethylene glycol (MIRALAX / GLYCOLAX) packet Take 17 g by mouth daily. 14 each 0    potassium chloride (K-DUR) 10 MEQ tablet TAKE BY MOUTH DAILY WITH FLUID PILL     Saw Palmetto 500 MG CAPS Take 1 capsule by mouth 2 (two) times daily.     simvastatin (ZOCOR) 40 MG tablet Take 40 mg by mouth daily.     vitamin C (ASCORBIC ACID) 500 MG tablet Take 500 mg by mouth daily.       warfarin (COUMADIN) 5 MG tablet Take 1.5 tablets (7.5 mg total) by mouth daily at 6 PM. OR AS DIRECTED by MD. (Patient taking differently: Take 7.5 mg by mouth daily at 6 PM. MANAGED MY PMD) 60 tablet 0   sacubitril-valsartan (ENTRESTO) 24-26 MG Take 1 tablet by mouth 2 (two) times daily. 60 tablet 6   No current facility-administered medications for this visit.    Allergies:  Patient has no known allergies.   Social History: The patient  reports that he quit smoking about 40 years ago. His smoking use included cigarettes. He has a 28.00 pack-year smoking history. He has never used smokeless tobacco. He reports that he does not drink alcohol or use drugs.   ROS:  Please see the history of present illness. Otherwise, complete review of systems is positive for hearing loss.  All other systems are reviewed and negative.   Physical Exam: VS:  BP 131/79    Pulse 90    Temp 98 F (36.7 C) (Temporal)    Ht 5\' 8"  (1.727 m)    Wt 239 lb (108.4 kg)    SpO2 98%    BMI 36.34 kg/m , BMI Body mass index is 36.34 kg/m.  Wt Readings from Last 3 Encounters:  10/07/18 239 lb (108.4 kg)  06/06/18 247 lb (112 kg)  11/11/17 243 lb (110.2 kg)    General: Overweight elderly male, using a walker. HEENT: Conjunctiva and lids normal. Neck: Supple, no elevated JVP or carotid bruits, no thyromegaly. Lungs: Bronchial breath sounds without wheezing, nonlabored breathing at rest. Cardiac: Regular rate and rhythm, no S3, soft systolic murmur. Abdomen: Soft, nontender, bowel sounds present. Extremities: Chronic appearing lower leg edema, distal pulses 1-2+. Skin: Warm and dry. Musculoskeletal: No  kyphosis. Neuropsychiatric: Alert and oriented x3, affect grossly appropriate.  ECG:  An ECG dated 11/11/2017 was personally reviewed today and demonstrated:  Sinus rhythm with old anterior infarct pattern and nonspecific ST-T changes.  Recent Labwork:  November 2019: Hemoglobin 10.4, platelets 164, BUN 40, creatinine 1.29, potassium 4.9, AST 14, ALT 14, cholesterol 118, triglycerides 124, HDL 40, LDL 53, hemoglobin A1c 5.5% March 2020: Hemoglobin 12.2, platelets 170, BUN 30, creatinine 1.1, potassium 4.4, AST 21, ALT 17, cholesterol 148, triglycerides 177, HDL 41, LDL 72  Other Studies Reviewed Today:  Echocardiogram 03/18/2018: Study Conclusions  - Left ventricle: The cavity size was normal. Wall thickness was  increased in a pattern of mild LVH. Systolic function was normal. The estimated ejection fraction was in the range of 40% to 50%. - Aortic valve: Mildly calcified annulus. Trileaflet; mildly thickened leaflets. Valve area (VTI): 2.24 cm^2. Valve area (Vmax): 1.91 cm^2. Valve area (Vmean): 1.68 cm^2. - Mitral valve: Mildly calcified annulus. Mildly thickened leaflets . - Left atrium: The atrium was moderately dilated. - Technically difficult study, particularly the apical views. Recommend echocontrast to better evaluate LVEF. Grossly LVEF apepars 40 to 50%.  Lexiscan Myoview 09/03/2015: IMPRESSION: 1. Large fixed defect at the apex extending into the anterior and inferior walls. Concern for peri-infarct ischemia in the anterior wall and lower septum.  2. Severe diffuse hypokinesia with akinesia in the areas of scar.  3. Left ventricular ejection fraction 26%  Assessment and Plan:  1.  Chronic systolic heart failure, LVEF approximately 40% at the lowest by echocardiogram in December 2019.  He has chronic shortness of breath, weight is down and he does not show any obvious further volume overload compared to prior assessment.  He states that he has been  compliant with his medications.  Also suspect multifactorial component to his shortness of breath.  We will initiate a trial of Entresto starting at lowest dose after appropriate washout of lisinopril.  Follow-up BMET in 7 to 10 days and clinical visit thereafter.  2.  Multivessel CAD status post CABG with redo operation in 2016.  We also discussed the possibility that some of his symptoms could be ischemic.  He does not report any obvious angina.  Continue aspirin and statin for now.  3.  Mixed hyperlipidemia on Zocor.  Last LDL was 72.  4.  Essential hypertension, blood pressure control has been adequate.  Medication Adjustments/Labs and Tests Ordered: Current medicines are reviewed at length with the patient today.  Concerns regarding medicines are outlined above.   Tests Ordered: Orders Placed This Encounter  Procedures   Basic Metabolic Panel (BMET)    Medication Changes: Meds ordered this encounter  Medications   DISCONTD: sacubitril-valsartan (ENTRESTO) 24-26 MG    Sig: Take 1 tablet by mouth 2 (two) times daily.    Dispense:  60 tablet    Refill:  6    Please Honor Card patient is presenting for Carmie Kanner: [id number]; Juanna Cao: [phone]; FKCLE: [id number]; [id number]   sacubitril-valsartan (ENTRESTO) 24-26 MG    Sig: Take 1 tablet by mouth 2 (two) times daily.    Dispense:  60 tablet    Refill:  6    Please Honor Card patient is presenting for Carmie Kanner: [id number]; Juanna Cao: [phone]; PQZRA: [phone]; ISSUER: [id number]    Disposition:  Follow up 6 weeks in the [place] office.  Signed, Satira Sark, MD, [facility] 10/07/2018 10:11 AM    [facility] [place]. [address] [place], [place] [address] Phone: [phone]; Fax: [phone]

## 2018-10-07 NOTE — Patient Instructions (Signed)
Medication Instructions:  STOP Lisinopril, WAIT 36 hours BEFORE you start Entresto 24/26 mg twice a day  Labwork:  BMET in 7-10 days  Procedures/Testing: None  Follow-Up: 6 weeks with Dr.McDowell  Any Additional Special Instructions Will Be Listed Below (If Applicable).     If you need a refill on your cardiac medications before your next appointment, please call your pharmacy.      Thank you for choosing [facility] !

## 2018-10-15 DIAGNOSIS — D631 Anemia in chronic kidney disease: Secondary | ICD-10-CM | POA: Diagnosis not present

## 2018-10-15 DIAGNOSIS — I129 Hypertensive chronic kidney disease with stage 1 through stage 4 chronic kidney disease, or unspecified chronic kidney disease: Secondary | ICD-10-CM | POA: Diagnosis not present

## 2018-10-15 DIAGNOSIS — Z8673 Personal history of transient ischemic attack (TIA), and cerebral infarction without residual deficits: Secondary | ICD-10-CM | POA: Diagnosis not present

## 2018-10-15 DIAGNOSIS — I5030 Unspecified diastolic (congestive) heart failure: Secondary | ICD-10-CM | POA: Diagnosis not present

## 2018-10-15 DIAGNOSIS — R7301 Impaired fasting glucose: Secondary | ICD-10-CM | POA: Diagnosis not present

## 2018-10-15 DIAGNOSIS — N183 Chronic kidney disease, stage 3 (moderate): Secondary | ICD-10-CM | POA: Diagnosis not present

## 2018-10-15 DIAGNOSIS — R0602 Shortness of breath: Secondary | ICD-10-CM | POA: Diagnosis not present

## 2018-10-15 DIAGNOSIS — Z96652 Presence of left artificial knee joint: Secondary | ICD-10-CM | POA: Diagnosis not present

## 2018-10-15 DIAGNOSIS — E785 Hyperlipidemia, unspecified: Secondary | ICD-10-CM | POA: Diagnosis not present

## 2018-10-15 DIAGNOSIS — Z0000 Encounter for general adult medical examination without abnormal findings: Secondary | ICD-10-CM | POA: Diagnosis not present

## 2018-10-24 DIAGNOSIS — Z7901 Long term (current) use of anticoagulants: Secondary | ICD-10-CM | POA: Diagnosis not present

## 2018-10-24 DIAGNOSIS — I255 Ischemic cardiomyopathy: Secondary | ICD-10-CM | POA: Diagnosis not present

## 2018-11-14 DIAGNOSIS — D6869 Other thrombophilia: Secondary | ICD-10-CM | POA: Diagnosis not present

## 2018-11-14 DIAGNOSIS — Z7901 Long term (current) use of anticoagulants: Secondary | ICD-10-CM | POA: Diagnosis not present

## 2018-11-14 DIAGNOSIS — I255 Ischemic cardiomyopathy: Secondary | ICD-10-CM | POA: Diagnosis not present

## 2018-11-17 ENCOUNTER — Other Ambulatory Visit: Payer: Self-pay

## 2018-11-17 DIAGNOSIS — Z20822 Contact with and (suspected) exposure to covid-19: Secondary | ICD-10-CM

## 2018-11-18 ENCOUNTER — Other Ambulatory Visit: Payer: Self-pay

## 2018-11-18 ENCOUNTER — Telehealth (INDEPENDENT_AMBULATORY_CARE_PROVIDER_SITE_OTHER): Payer: Medicare HMO | Admitting: Cardiology

## 2018-11-18 ENCOUNTER — Encounter: Payer: Self-pay | Admitting: Cardiology

## 2018-11-18 ENCOUNTER — Telehealth: Payer: Self-pay | Admitting: Licensed Clinical Social Worker

## 2018-11-18 VITALS — Ht 68.0 in | Wt 235.0 lb

## 2018-11-18 DIAGNOSIS — I110 Hypertensive heart disease with heart failure: Secondary | ICD-10-CM | POA: Diagnosis not present

## 2018-11-18 DIAGNOSIS — I25119 Atherosclerotic heart disease of native coronary artery with unspecified angina pectoris: Secondary | ICD-10-CM | POA: Diagnosis not present

## 2018-11-18 DIAGNOSIS — E782 Mixed hyperlipidemia: Secondary | ICD-10-CM

## 2018-11-18 DIAGNOSIS — I255 Ischemic cardiomyopathy: Secondary | ICD-10-CM | POA: Diagnosis not present

## 2018-11-18 DIAGNOSIS — I5022 Chronic systolic (congestive) heart failure: Secondary | ICD-10-CM

## 2018-11-18 DIAGNOSIS — I10 Essential (primary) hypertension: Secondary | ICD-10-CM

## 2018-11-18 NOTE — Progress Notes (Signed)
Virtual Visit via Telephone Note   This visit type was conducted due to national recommendations for restrictions regarding the COVID-19 Pandemic (e.g. social distancing) in an effort to limit this patient's exposure and mitigate transmission in our community.  Due to his co-morbid illnesses, this patient is at least at moderate risk for complications without adequate follow up.  This format is felt to be most appropriate for this patient at this time.  The patient did not have access to video technology/had technical difficulties with video requiring transitioning to audio format only (telephone).  All issues noted in this document were discussed and addressed.  No physical exam could be performed with this format.  Please refer to the patient's chart for his  consent to telehealth for [facility].   Date:  11/18/2018   ID:  Benjamin Cordova, DOB [birth year], MRN [id number]  Patient Location: Home Provider Location: Office  PCP:  Celene Squibb, MD  Cardiologist:  Rozann Lesches, MD Electrophysiologist:  None   Evaluation Performed:  Follow-Up Visit  Chief Complaint:   Cardiac follow-up  History of Present Illness:    Benjamin Cordova is an 84 y.o. male last seen in early July.  He did not have video access and we communicated by phone today.  He tells me that he feels somewhat better following medication adjustments. At the last visit he was switched from lisinopril to [facility].  He has been somewhat less short of breath, no chest pain.  He is functional with ADLs, otherwise no regular exercise plan.  He is on Coumadin with follow-up by Dr. Nevada Crane.  He does not report any bleeding episodes.  Today we discussed continuing his current cardiac regimen with plan for a follow-up echocardiogram around the time of his next visit.  He has been on a stable diuretic dose.  His weight is actually down a few pounds.  The patient does not have symptoms concerning for COVID-19 infection (fever, chills,  cough, or new shortness of breath).  He just had a SARS coronavirus 2 test due to positives in his extended family.   Past Medical History:  Diagnosis Date  . Arthritis   . Coronary artery disease    Multivessel s/p CABG [facility] at [facility]  . Essential hypertension   . Hematuria   . History of blood transfusion 07/2015   Knee replacement HgB <9  . History of pneumonia   . History of stroke   . Mixed hyperlipidemia   . Myocardial infarction ([place]) 1980  . Peripheral vascular disease ([place])   . Polio osteopathy of lower leg ([place]) Age 62   Affected right leg  . Skin cancer   . TIA (transient ischemic attack) 1989   Chronic coumadin  . UTI (lower urinary tract infection)    Past Surgical History:  Procedure Laterality Date  . CARDIAC CATHETERIZATION N/A 10/05/2014   Procedure: Left Heart Cath and Coronary Angiography;  Surgeon: Peter M Martinique, MD;  Location: [place] [facility];  Service: Cardiovascular;  Laterality: N/A;  . CORONARY ARTERY BYPASS GRAFT  1981   [facility]  . CORONARY ARTERY BYPASS GRAFT N/A 10/08/2014   Procedure: REDO CORONARY ARTERY BYPASS GRAFTING (CABG) Times Two Grafts using Left Internal Mammary and Left Radial Artery;  Surgeon: Melrose Nakayama, MD;  Location: [place];  Service: Open Heart Surgery;  Laterality: N/A;  . CYSTOSCOPY WITH INJECTION  10/10/2010   Procedure: CYSTOSCOPY WITH INJECTION;  Surgeon: Marissa Nestle;  [facility];  Service: Urology;  Laterality: N/A;  with retrograde urethrogram  . FOOT SURGERY Right 1954   [facility], muscle implantation  . JOINT REPLACEMENT    . RADIAL ARTERY HARVEST Left 10/08/2014   Procedure: Left RADIAL ARTERY HARVEST;  Surgeon: Melrose Nakayama, MD;  Location: [place];  Service: Open Heart Surgery;  Laterality: Left;  . TEE WITHOUT CARDIOVERSION N/A 10/08/2014   Procedure: TRANSESOPHAGEAL ECHOCARDIOGRAM (TEE);  Surgeon: Melrose Nakayama, MD;  Location: [place];  Service: Open Heart Surgery;  Laterality: N/A;  .  TONSILLECTOMY  1949   [facility]  . TOTAL KNEE ARTHROPLASTY Left 07/27/2015   Procedure: TOTAL LEFT KNEE ARTHROPLASTY;  Surgeon: [name], MD;  Location: [facility];  Service: Orthopedics;  Laterality: Left;     Current Meds  Medication Sig  . B Complex-C-Folic Acid (SUPER B-COMPLEX/VIT C/FA) TABS Take 1 tablet by mouth daily.    . carvedilol (COREG) 12.5 MG tablet TAKE 1 TABLET TWICE DAILY  (STOP  ATENOLOL)  . diphenhydramine-acetaminophen (TYLENOL PM) 25-500 MG TABS tablet Take 2 tablets by mouth at bedtime as needed.  . folic acid (FOLVITE) 267 MCG tablet Take 400 mcg by mouth daily.   . furosemide (LASIX) 20 MG tablet TAKE 1 TABLET BY MOUTH ONCE DAILY FOR HEART  . hydrochlorothiazide (HYDRODIURIL) 25 MG tablet Take 50 mg by mouth daily.   . Multiple Vitamins-Minerals (CERTAVITE/ANTIOXIDANTS) TABS Take 1 tablet by mouth daily.   . Omega-3 Fatty Acids (FISH OIL) 500 MG CAPS Take 500 mg by mouth at bedtime.  . potassium chloride (K-DUR) 10 MEQ tablet TAKE BY MOUTH DAILY WITH FLUID PILL  . sacubitril-valsartan (ENTRESTO) 24-26 MG Take 1 tablet by mouth 2 (two) times daily.  . Saw Palmetto 500 MG CAPS Take 1 capsule by mouth 2 (two) times daily.  . simvastatin (ZOCOR) 40 MG tablet Take 40 mg by mouth daily.  . vitamin C (ASCORBIC ACID) 500 MG tablet Take 500 mg by mouth 2 (two) times daily.   Marland Kitchen warfarin (COUMADIN) 5 MG tablet Take 1.5 tablets (7.5 mg total) by mouth daily at 6 PM. OR AS DIRECTED by MD. (Patient taking differently: Take 7.5 mg by mouth daily at 6 PM. MANAGED MY PMD)  . [DISCONTINUED] polyethylene glycol (MIRALAX / GLYCOLAX) packet Take 17 g by mouth daily.     Allergies:   Patient has no known allergies.   Social History   Tobacco Use  . Smoking status: Former Smoker    Packs/day: 1.00    Years: 28.00    Pack years: 28.00    Types: Cigarettes    Quit date: 09/22/1978    Years since quitting: 40.1  . Smokeless tobacco: Never Used  Substance Use Topics  . Alcohol use: No     Alcohol/week: 0.0 standard drinks  . Drug use: No     Family Hx: The patient's family history includes Diabetes in his mother; Heart attack in his brother, father, and mother. There is no history of Anesthesia problems, Hypotension, Malignant hyperthermia, or Pseudochol deficiency.  ROS:   Please see the history of present illness.    Hearing loss. All other systems reviewed and are negative.   Prior CV studies:   The following studies were reviewed today:  Echocardiogram 03/18/2018: Study Conclusions  - Left ventricle: The cavity size was normal. Wall thickness was   increased in a pattern of mild LVH. Systolic function was normal.   The estimated ejection fraction was in the range of 40% to 50%. -  Aortic valve: Mildly calcified annulus. Trileaflet; mildly   thickened leaflets. Valve area (VTI): 2.24 cm^2. Valve area   (Vmax): 1.91 cm^2. Valve area (Vmean): 1.68 cm^2. - Mitral valve: Mildly calcified annulus. Mildly thickened leaflets. - Left atrium: The atrium was moderately dilated. - Technically difficult study, particularly the apical views.   Recommend echocontrast to better evaluate LVEF. Grossly LVEF   apepars 40 to 50%.  Labs/Other Tests and Data Reviewed:    EKG:  An ECG dated 11/11/2017 was personally reviewed today and demonstrated:  Sinus rhythm with IVCD and repolarization abnormalities.  Recent Labs:  March 2020: Hemoglobin 12.2, platelets 170, BUN 30, creatinine 1.1, potassium 4.4, AST 21, ALT 17, cholesterol 148, triglycerides 177, HDL 41, LDL 72  Wt Readings from Last 3 Encounters:  11/18/18 235 lb (106.6 kg)  10/07/18 239 lb (108.4 kg)  06/06/18 247 lb (112 kg)     Objective:    Vital Signs:  Ht 5\' 8"  (1.727 m)   Wt 235 lb (106.6 kg)   BMI 35.73 kg/m   He did not have a way to check vital signs today. Patient spoke in full sentences, not short of breath. No audible wheezing or coughing. Speech pattern normal.  ASSESSMENT & PLAN:    1.   Chronic systolic heart failure with LVEF approximately 40% by assessment in December 2019.  He is tolerating Entresto at this point, and we will otherwise continue Coreg and Lasix with potassium supplements.  Follow-up echocardiogram for next office visit.  2.  Multivessel CAD status post CABG with redo operation in 2016.  He is not reporting any obvious angina at this time, and for now we will continue with medical therapy and observation.  He is on aspirin and statin.  3.  Mixed hyperlipidemia on Zocor.  Last LDL was 72.  4.  Essential hypertension, no changes to current regimen.   Time:   Today, I have spent 9 minutes with the patient with telehealth technology discussing the above problems.     Medication Adjustments/Labs and Tests Ordered: Current medicines are reviewed at length with the patient today.  Concerns regarding medicines are outlined above.   Tests Ordered: Orders Placed This Encounter  Procedures  . ECHOCARDIOGRAM COMPLETE    Medication Changes: No orders of the defined types were placed in this encounter.   Follow Up:  In Person 4 months in the [place] office.  Signed, Rozann Lesches, MD  11/18/2018 1:36 PM    [facility]

## 2018-11-18 NOTE — Telephone Encounter (Signed)
CSW referred to assist patient with obtaining a BP cuff. CSW contacted patient to inform cuff will be delivered to home. Patient grateful for support and assistance. CSW available as needed. Jackie Glendon Fiser, LCSW, CCSW-MCS [phone]  

## 2018-11-18 NOTE — Patient Instructions (Signed)
Medication Instructions: Your physician recommends that you continue on your current medications as directed. Please refer to the Current Medication list given to you today.   Labwork: none  Procedures/Testing: Your physician has requested that you have an echocardiogram JUST BEFORE NEXT VISIT IN 4 MONTHS. Echocardiography is a painless test that uses sound waves to create images of your heart. It provides your doctor with information about the size and shape of your heart and how well your heart's chambers and valves are working. This procedure takes approximately one hour. There are no restrictions for this procedure.    Follow-Up: 4 MONTHS with Dr.McDowell  Any Additional Special Instructions Will Be Listed Below (If Applicable).     If you need a refill on your cardiac medications before your next appointment, please call your pharmacy. .    Thank you for choosing [facility] !

## 2018-11-19 LAB — NOVEL CORONAVIRUS, NAA: SARS-CoV-2, NAA: NOT DETECTED

## 2018-11-20 ENCOUNTER — Telehealth: Payer: Self-pay | Admitting: Internal Medicine

## 2018-11-20 NOTE — Telephone Encounter (Signed)
Patient called for the results of his Covid-19 test.  He was told that Covid was Not Detected. °

## 2018-12-12 DIAGNOSIS — I255 Ischemic cardiomyopathy: Secondary | ICD-10-CM | POA: Diagnosis not present

## 2018-12-12 DIAGNOSIS — D6869 Other thrombophilia: Secondary | ICD-10-CM | POA: Diagnosis not present

## 2018-12-12 DIAGNOSIS — Z7901 Long term (current) use of anticoagulants: Secondary | ICD-10-CM | POA: Diagnosis not present

## 2018-12-20 DIAGNOSIS — D649 Anemia, unspecified: Secondary | ICD-10-CM | POA: Diagnosis not present

## 2018-12-20 DIAGNOSIS — G14 Postpolio syndrome: Secondary | ICD-10-CM | POA: Diagnosis not present

## 2018-12-20 DIAGNOSIS — Z8673 Personal history of transient ischemic attack (TIA), and cerebral infarction without residual deficits: Secondary | ICD-10-CM | POA: Diagnosis not present

## 2018-12-20 DIAGNOSIS — Z7901 Long term (current) use of anticoagulants: Secondary | ICD-10-CM | POA: Diagnosis not present

## 2018-12-20 DIAGNOSIS — R7301 Impaired fasting glucose: Secondary | ICD-10-CM | POA: Diagnosis not present

## 2019-01-02 DIAGNOSIS — Z125 Encounter for screening for malignant neoplasm of prostate: Secondary | ICD-10-CM | POA: Diagnosis not present

## 2019-01-02 DIAGNOSIS — D649 Anemia, unspecified: Secondary | ICD-10-CM | POA: Diagnosis not present

## 2019-01-02 DIAGNOSIS — R7301 Impaired fasting glucose: Secondary | ICD-10-CM | POA: Diagnosis not present

## 2019-01-02 DIAGNOSIS — I10 Essential (primary) hypertension: Secondary | ICD-10-CM | POA: Diagnosis not present

## 2019-01-02 DIAGNOSIS — N183 Chronic kidney disease, stage 3 unspecified: Secondary | ICD-10-CM | POA: Diagnosis not present

## 2019-01-02 DIAGNOSIS — D638 Anemia in other chronic diseases classified elsewhere: Secondary | ICD-10-CM | POA: Diagnosis not present

## 2019-01-02 DIAGNOSIS — D631 Anemia in chronic kidney disease: Secondary | ICD-10-CM | POA: Diagnosis not present

## 2019-01-02 DIAGNOSIS — D509 Iron deficiency anemia, unspecified: Secondary | ICD-10-CM | POA: Diagnosis not present

## 2019-01-02 DIAGNOSIS — E782 Mixed hyperlipidemia: Secondary | ICD-10-CM | POA: Diagnosis not present

## 2019-01-02 DIAGNOSIS — E785 Hyperlipidemia, unspecified: Secondary | ICD-10-CM | POA: Diagnosis not present

## 2019-01-09 DIAGNOSIS — I129 Hypertensive chronic kidney disease with stage 1 through stage 4 chronic kidney disease, or unspecified chronic kidney disease: Secondary | ICD-10-CM | POA: Diagnosis not present

## 2019-01-09 DIAGNOSIS — R0602 Shortness of breath: Secondary | ICD-10-CM | POA: Diagnosis not present

## 2019-01-09 DIAGNOSIS — Z0000 Encounter for general adult medical examination without abnormal findings: Secondary | ICD-10-CM | POA: Diagnosis not present

## 2019-01-09 DIAGNOSIS — I5030 Unspecified diastolic (congestive) heart failure: Secondary | ICD-10-CM | POA: Diagnosis not present

## 2019-01-09 DIAGNOSIS — G14 Postpolio syndrome: Secondary | ICD-10-CM | POA: Diagnosis not present

## 2019-01-09 DIAGNOSIS — D631 Anemia in chronic kidney disease: Secondary | ICD-10-CM | POA: Diagnosis not present

## 2019-01-09 DIAGNOSIS — N400 Enlarged prostate without lower urinary tract symptoms: Secondary | ICD-10-CM | POA: Diagnosis not present

## 2019-01-09 DIAGNOSIS — R5383 Other fatigue: Secondary | ICD-10-CM | POA: Diagnosis not present

## 2019-01-09 DIAGNOSIS — D6869 Other thrombophilia: Secondary | ICD-10-CM | POA: Diagnosis not present

## 2019-01-09 DIAGNOSIS — I255 Ischemic cardiomyopathy: Secondary | ICD-10-CM | POA: Diagnosis not present

## 2019-01-09 DIAGNOSIS — N182 Chronic kidney disease, stage 2 (mild): Secondary | ICD-10-CM | POA: Diagnosis not present

## 2019-01-09 DIAGNOSIS — Z7901 Long term (current) use of anticoagulants: Secondary | ICD-10-CM | POA: Diagnosis not present

## 2019-01-09 DIAGNOSIS — R7301 Impaired fasting glucose: Secondary | ICD-10-CM | POA: Diagnosis not present

## 2019-01-09 DIAGNOSIS — E785 Hyperlipidemia, unspecified: Secondary | ICD-10-CM | POA: Diagnosis not present

## 2019-01-09 DIAGNOSIS — Z96652 Presence of left artificial knee joint: Secondary | ICD-10-CM | POA: Diagnosis not present

## 2019-01-09 DIAGNOSIS — Z8673 Personal history of transient ischemic attack (TIA), and cerebral infarction without residual deficits: Secondary | ICD-10-CM | POA: Diagnosis not present

## 2019-01-09 DIAGNOSIS — N183 Chronic kidney disease, stage 3 unspecified: Secondary | ICD-10-CM | POA: Diagnosis not present

## 2019-01-09 DIAGNOSIS — I5022 Chronic systolic (congestive) heart failure: Secondary | ICD-10-CM | POA: Diagnosis not present

## 2019-01-09 DIAGNOSIS — D509 Iron deficiency anemia, unspecified: Secondary | ICD-10-CM | POA: Diagnosis not present

## 2019-02-03 ENCOUNTER — Emergency Department (HOSPITAL_COMMUNITY): Payer: Medicare HMO

## 2019-02-03 ENCOUNTER — Other Ambulatory Visit: Payer: Self-pay

## 2019-02-03 ENCOUNTER — Encounter (HOSPITAL_COMMUNITY): Payer: Self-pay | Admitting: Emergency Medicine

## 2019-02-03 ENCOUNTER — Emergency Department (HOSPITAL_COMMUNITY)
Admission: EM | Admit: 2019-02-03 | Discharge: 2019-02-03 | Disposition: A | Discharge status: Home / Self Care | Payer: Medicare HMO | Attending: Emergency Medicine | Admitting: Emergency Medicine

## 2019-02-03 DIAGNOSIS — Z87891 Personal history of nicotine dependence: Secondary | ICD-10-CM | POA: Insufficient documentation

## 2019-02-03 DIAGNOSIS — I259 Chronic ischemic heart disease, unspecified: Secondary | ICD-10-CM | POA: Diagnosis not present

## 2019-02-03 DIAGNOSIS — Y999 Unspecified external cause status: Secondary | ICD-10-CM | POA: Diagnosis not present

## 2019-02-03 DIAGNOSIS — I5042 Chronic combined systolic (congestive) and diastolic (congestive) heart failure: Secondary | ICD-10-CM | POA: Diagnosis not present

## 2019-02-03 DIAGNOSIS — R609 Edema, unspecified: Secondary | ICD-10-CM | POA: Diagnosis not present

## 2019-02-03 DIAGNOSIS — I110 Hypertensive heart disease with heart failure: Secondary | ICD-10-CM | POA: Diagnosis not present

## 2019-02-03 DIAGNOSIS — S8992XA Unspecified injury of left lower leg, initial encounter: Secondary | ICD-10-CM | POA: Diagnosis not present

## 2019-02-03 DIAGNOSIS — Z23 Encounter for immunization: Secondary | ICD-10-CM | POA: Insufficient documentation

## 2019-02-03 DIAGNOSIS — W19XXXA Unspecified fall, initial encounter: Secondary | ICD-10-CM

## 2019-02-03 DIAGNOSIS — S0181XA Laceration without foreign body of other part of head, initial encounter: Secondary | ICD-10-CM | POA: Diagnosis not present

## 2019-02-03 DIAGNOSIS — Y9289 Other specified places as the place of occurrence of the external cause: Secondary | ICD-10-CM | POA: Diagnosis not present

## 2019-02-03 DIAGNOSIS — R58 Hemorrhage, not elsewhere classified: Secondary | ICD-10-CM | POA: Diagnosis not present

## 2019-02-03 DIAGNOSIS — Y9301 Activity, walking, marching and hiking: Secondary | ICD-10-CM | POA: Diagnosis not present

## 2019-02-03 DIAGNOSIS — Z7901 Long term (current) use of anticoagulants: Secondary | ICD-10-CM | POA: Diagnosis not present

## 2019-02-03 DIAGNOSIS — Z79899 Other long term (current) drug therapy: Secondary | ICD-10-CM | POA: Insufficient documentation

## 2019-02-03 DIAGNOSIS — W01198A Fall on same level from slipping, tripping and stumbling with subsequent striking against other object, initial encounter: Secondary | ICD-10-CM | POA: Insufficient documentation

## 2019-02-03 DIAGNOSIS — M25562 Pain in left knee: Secondary | ICD-10-CM | POA: Diagnosis not present

## 2019-02-03 DIAGNOSIS — S0990XA Unspecified injury of head, initial encounter: Secondary | ICD-10-CM | POA: Diagnosis not present

## 2019-02-03 DIAGNOSIS — S199XXA Unspecified injury of neck, initial encounter: Secondary | ICD-10-CM | POA: Diagnosis not present

## 2019-02-03 DIAGNOSIS — R52 Pain, unspecified: Secondary | ICD-10-CM | POA: Diagnosis not present

## 2019-02-03 DIAGNOSIS — R0902 Hypoxemia: Secondary | ICD-10-CM | POA: Diagnosis not present

## 2019-02-03 LAB — BASIC METABOLIC PANEL
Anion gap: 8 (ref 5–15)
BUN: 32 mg/dL — ABNORMAL HIGH (ref 8–23)
CO2: 31 mmol/L (ref 22–32)
Calcium: 8.9 mg/dL (ref 8.9–10.3)
Chloride: 99 mmol/L (ref 98–111)
Creatinine, Ser: 0.97 mg/dL (ref 0.61–1.24)
GFR calc Af Amer: >60 mL/min (ref >60)
GFR calc non Af Amer: >60 mL/min (ref >60)
Glucose, Bld: 129 mg/dL — ABNORMAL HIGH (ref 70–99)
Potassium: 3.7 mmol/L (ref 3.5–5.1)
Sodium: 138 mmol/L (ref 135–145)

## 2019-02-03 LAB — CBC WITH DIFFERENTIAL/PLATELET
Abs Immature Granulocytes: 0.05 10*3/uL (ref 0.00–0.07)
Basophils Absolute: 0 10*3/uL (ref 0.0–0.1)
Basophils Relative: 1 %
Eosinophils Absolute: 0.1 10*3/uL (ref 0.0–0.5)
Eosinophils Relative: 1 %
HCT: 37.8 % — ABNORMAL LOW (ref 39.0–52.0)
Hemoglobin: 11.8 g/dL — ABNORMAL LOW (ref 13.0–17.0)
Immature Granulocytes: 1 %
Lymphocytes Relative: 7 %
Lymphs Abs: 0.4 10*3/uL — ABNORMAL LOW (ref 0.7–4.0)
MCH: 29.1 pg (ref 26.0–34.0)
MCHC: 31.2 g/dL (ref 30.0–36.0)
MCV: 93.3 fL (ref 80.0–100.0)
Monocytes Absolute: 0.4 10*3/uL (ref 0.1–1.0)
Monocytes Relative: 7 %
Neutro Abs: 5.4 10*3/uL (ref 1.7–7.7)
Neutrophils Relative %: 83 %
Platelets: 145 10*3/uL — ABNORMAL LOW (ref 150–400)
RBC: 4.05 MIL/uL — ABNORMAL LOW (ref 4.22–5.81)
RDW: 13.6 % (ref 11.5–15.5)
WBC: 6.4 10*3/uL (ref 4.0–10.5)
nRBC: 0 % (ref 0.0–0.2)

## 2019-02-03 LAB — PROTIME-INR
INR: 1.9 — ABNORMAL HIGH (ref 0.8–1.2)
Prothrombin Time: 21.3 seconds — ABNORMAL HIGH (ref 11.4–15.2)

## 2019-02-03 MED ORDER — TETANUS-DIPHTH-ACELL PERTUSSIS 5-2.5-18.5 LF-MCG/0.5 IM SUSP
0.5000 mL | Freq: Once | INTRAMUSCULAR | Status: AC
  Administered 2019-02-03: 13:00:00 0.5 mL via INTRAMUSCULAR
  Filled 2019-02-03: qty 0.5

## 2019-02-03 NOTE — ED Triage Notes (Signed)
Per EMS, pt fell in parking lot trying to get out of vehicle. Pt c/o LT knee pain (hx of knee replacement in 2017). Pt hit head and has laceration to forehead. Pt is on blood thinners. Denies LOC, n/v, or dizziness.

## 2019-02-03 NOTE — ED Provider Notes (Signed)
[facility] EMERGENCY DEPARTMENT Provider Note   CSN: [id number] Arrival date & time: 02/03/19  1120     History   Chief Complaint Chief Complaint  Patient presents with  . Fall    HPI Benjamin Cordova is a 84 y.o. male.     The history is provided by the patient and medical records. No language interpreter was used.  Fall This is a new problem. The current episode started less than 1 hour ago. Pertinent negatives include no chest pain, no abdominal pain, no headaches and no shortness of breath. Nothing aggravates the symptoms. Nothing relieves the symptoms. He has tried nothing for the symptoms.  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  Patient uses a walker and states that he was walking downhill and states that the brakes did not work well on his walker.  He lost his balance, fell onto his left knee and hit the left side of his head.  He has a small cut to the left side of his head.  He is unsure of his last tetanus vaccination.  He denies losing consciousness.  He was able to get himself back up and ambulate.  He denies any new numbness, tingling or weakness.   Past Medical History:  Diagnosis Date  . Arthritis   . Coronary artery disease    Multivessel s/p CABG 1981 at [facility]  . Essential hypertension   . Hematuria   . History of blood transfusion 07/2015   Knee replacement HgB <9  . History of pneumonia   . History of stroke   . Mixed hyperlipidemia   . Myocardial infarction ([place]) 1980  . Peripheral vascular disease ([place])   . Polio osteopathy of lower leg ([place]) Age 84   Affected right leg  . Skin cancer   . TIA (transient ischemic attack) 1989   Chronic coumadin  . UTI (lower urinary tract infection)     Patient Active Problem List   Diagnosis Date Noted  .  Subtherapeutic anticoagulation   . Acute on chronic respiratory failure with hypoxia and hypercapnia (HCC)   . Acute on chronic combined systolic and diastolic CHF (congestive heart failure) ([place])   . LV (left ventricular) mural thrombus 09/03/2015  . Systolic and diastolic CHF, acute on chronic ([place])   . Hypokalemia   . CHF (congestive heart failure) ([place]) 09/01/2015  . Respiratory failure with hypoxia ([place]) 08/31/2015  . HCAP (healthcare-associated pneumonia) 08/31/2015  . Acute respiratory failure with hypoxia ([place])   . Encounter for central line placement   . Knee swelling   . Subtherapeutic international normalized ratio (INR) 08/08/2015  . Constipation 08/08/2015  . Hyperlipidemia 08/08/2015  . Blurred vision 07/29/2015  . Anemia 07/29/2015  . S/P total knee replacement 07/27/2015  . S/P CABG x 2 10/08/2014  . Chest pain 10/01/2014  . Elevated INR 10/01/2014  . Leukocytosis 10/01/2014  . Thrombocytopenia ([place]) 10/01/2014  . Chest pain at rest 10/01/2014  . Unstable angina ([place]) 10/01/2014  . Coagulopathy ([place]) 10/01/2014  . Paroxysmal atrial fibrillation ([place]) 10/01/2014  . Coronary artery disease   . Hypertension   . Arthritis   . Peripheral vascular disease ([place])   . Essential hypertension     Past Surgical History:  Procedure Laterality Date  . CARDIAC CATHETERIZATION N/A 10/05/2014   Procedure: Left Heart Cath and Coronary Angiography;  Surgeon: Peter M Martinique, MD;  Location: [place] CV LAB;  Service: Cardiovascular;  Laterality: N/A;  . CORONARY ARTERY BYPASS GRAFT  1981   [facility]  . CORONARY ARTERY BYPASS GRAFT N/A 10/08/2014   Procedure: REDO CORONARY ARTERY BYPASS GRAFTING (CABG) Times Two Grafts using Left Internal Mammary and Left Radial Artery;  Surgeon: Melrose Nakayama, MD;  Location: [place];  Service: Open Heart Surgery;  Laterality: N/A;  . CYSTOSCOPY WITH INJECTION  10/10/2010   Procedure: CYSTOSCOPY WITH INJECTION;  Surgeon: Marissa Nestle;  [facility]: [facility];  Service: Urology;  Laterality: N/A;  with retrograde urethrogram  . FOOT SURGERY Right 1954   [facility], muscle implantation  . JOINT REPLACEMENT    . RADIAL ARTERY HARVEST Left 10/08/2014   Procedure: Left RADIAL ARTERY HARVEST;  Surgeon: Melrose Nakayama, MD;  Location: [place];  Service: Open Heart Surgery;  Laterality: Left;  . TEE WITHOUT CARDIOVERSION N/A 10/08/2014   Procedure: TRANSESOPHAGEAL ECHOCARDIOGRAM (TEE);  Surgeon: Melrose Nakayama, MD;  Location: [place];  Service: Open Heart Surgery;  Laterality: N/A;  . TONSILLECTOMY  1949   [facility]  . TOTAL KNEE ARTHROPLASTY Left 07/27/2015   Procedure: TOTAL LEFT KNEE ARTHROPLASTY;  Surgeon: [name], MD;  Location: [place];  Service: Orthopedics;  Laterality: Left;        Home Medications    Prior to Admission medications   Medication Sig Start Date End Date Taking? Authorizing Provider  B Complex-C-Folic Acid (SUPER B-COMPLEX/VIT C/FA) TABS Take 1 tablet by mouth daily.      [provider]  carvedilol (COREG) 12.5 MG tablet TAKE 1 TABLET TWICE DAILY  (STOP  ATENOLOL) 10/08/16   Satira Sark, MD  diphenhydramine-acetaminophen (TYLENOL PM) 25-500 MG TABS tablet Take 2 tablets by mouth at bedtime as needed.    [provider]  folic acid (FOLVITE) Q000111Q MCG tablet Take 400 mcg by mouth daily.     [provider]  furosemide (LASIX) 20 MG tablet TAKE 1 TABLET BY MOUTH ONCE DAILY FOR HEART 03/18/18   [provider]  hydrochlorothiazide (HYDRODIURIL) 25 MG tablet Take 50 mg by mouth daily.     [provider]  Multiple Vitamins-Minerals (CERTAVITE/ANTIOXIDANTS) TABS Take 1 tablet by mouth daily.     [provider]  Omega-3 Fatty Acids (FISH OIL) 500 MG CAPS Take 500 mg by mouth at bedtime.    [provider]  potassium chloride (K-DUR) 10 MEQ tablet TAKE BY MOUTH DAILY WITH FLUID PILL 03/18/18   [provider]  sacubitril-valsartan  (ENTRESTO) 24-26 MG Take 1 tablet by mouth 2 (two) times daily. 10/07/18   Satira Sark, MD  Saw Palmetto 500 MG CAPS Take 1 capsule by mouth 2 (two) times daily.    [provider]  simvastatin (ZOCOR) 40 MG tablet Take 40 mg by mouth daily.    [provider]  tamsulosin (FLOMAX) 0.4 MG CAPS capsule Take 0.4 mg by mouth daily. 12/02/18   [provider]  vitamin C (ASCORBIC ACID) 500 MG tablet Take 500 mg by mouth 2 (two) times daily.     [provider]  warfarin (COUMADIN) 5 MG tablet Take 1.5 tablets (7.5 mg total) by mouth daily at 6 PM. OR AS DIRECTED by MD. Patient taking differently: Take 7.5 mg by mouth daily at 6 PM. MANAGED MY PMD 09/07/15   Modena Jansky, MD    Family History Family History  Problem Relation Age  of Onset  . Heart attack Mother   . Diabetes Mother   . Heart attack Father   . Heart attack Brother   . Anesthesia problems Neg Hx   . Hypotension Neg Hx   . Malignant hyperthermia Neg Hx   . Pseudochol deficiency Neg Hx     Social History Social History   Tobacco Use  . Smoking status: Former Smoker    Packs/day: 1.00    Years: 28.00    Pack years: 28.00    Types: Cigarettes    Quit date: 09/22/1978    Years since quitting: 40.3  . Smokeless tobacco: Never Used  Substance Use Topics  . Alcohol use: No    Alcohol/week: 0.0 standard drinks  . Drug use: No     Allergies   Patient has no known allergies.   Review of Systems Review of Systems  Respiratory: Negative for shortness of breath.   Cardiovascular: Negative for chest pain.  Gastrointestinal: Negative for abdominal pain.  Neurological: Negative for headaches.  Ten systems reviewed and are negative for acute change, except as noted in the HPI.     Physical Exam Updated Vital Signs BP 137/66 (BP Location: Left Arm)   Pulse 81   Temp 98.5 F (36.9 C) (Oral)   Resp 16   Ht 5\' 8"  (1.727 m)   Wt 108.9 kg   SpO2 96%   BMI 36.49 kg/m    Physical Exam Vitals signs and nursing note reviewed.  Constitutional:      General: He is not in acute distress.    Appearance: He is well-developed. He is not diaphoretic.  HENT:     Head: Normocephalic.     Comments: Small laceration to the left forehead Eyes:     General: No scleral icterus.    Conjunctiva/sclera: Conjunctivae normal.  Neck:     Musculoskeletal: Normal range of motion and neck supple.  Cardiovascular:     Rate and Rhythm: Normal rate and regular rhythm.     Heart sounds: Normal heart sounds.  Pulmonary:     Effort: Pulmonary effort is normal. No respiratory distress.     Breath sounds: Normal breath sounds.  Abdominal:     Palpations: Abdomen is soft.     Tenderness: There is no abdominal tenderness.  Musculoskeletal:     Comments: R knee s/p TKA. F P/AROM with no joint line tenderness or bruising  Skin:    General: Skin is warm and dry.  Neurological:     Mental Status: He is alert.  Psychiatric:        Behavior: Behavior normal.      ED Treatments / Results  Labs (all labs ordered are listed, but only abnormal results are displayed) Labs Reviewed - No data to display  EKG None  Radiology No results found.  Procedures Procedures (including critical care time)  Medications Ordered in ED Medications - No data to display   Initial Impression / Assessment and Plan / ED Course  I have reviewed the triage vital signs and the nursing notes.  Pertinent labs & imaging results that were available during my care of the patient were reviewed by me and considered in my medical decision making (see chart for details).  Clinical Course as of Feb 03 1416  Tue Feb 03, 2019  1349 Inr therapeutic  INR(!): 1.9 [AH]    Clinical Course User Index [AH] Margarita Mail, PA-C       [id number], head injury VS: BP 100/77  Pulse 91   Temp 98.5 F (36.9 C) (Oral)   Resp 18   Ht 5\' 8"  (1.727 m)   Wt 108.9 kg   SpO2 98%   BMI 36.49 kg/m  [id number]  is gathered by patient  and emr. [id number], seizure, mechanical fall Labs: I reviewed the labs which show elevated glucose at 129 and slightly elevated BUN. Mild chronic normocytic anemia. Imaging: I personally reviewed the images (CT head, cspine, and knee) which show(s) No acute intracranial or c-spine injury by my interpretation. L knee shows s/p arthroplasty with small effusion. Hardware in place and well aligned by my interpretation. EKG:N/A MDM: Patient with mechanical fall, imaging negative, Tdap updated. Wound is small and requires no repair. Appears appropriate for discharge at this time. Patient disposition:discharge Patient condition: good. The patient appears reasonably screened and/or stabilized for discharge and I doubt any other medical condition or other [facility] requiring further screening, evaluation, or treatment in the ED at this time prior to discharge. I have discussed lab and/or imaging findings with the patient and answered all questions/concerns to the best of my ability. I have discussed return precautions and OP follow up.      Final Clinical Impressions(s) / ED Diagnoses   Final diagnoses:  Fall, initial encounter  Cut of forehead    ED Discharge Orders    None       Margarita Mail, PA-C 02/03/19 1421    Long, Wonda Old[facility], MD 02/03/19 1918

## 2019-02-03 NOTE — Discharge Instructions (Signed)
Return to the er for any new or worsening symptoms. Follow closely with your primary care doctor.

## 2019-02-05 DIAGNOSIS — H2513 Age-related nuclear cataract, bilateral: Secondary | ICD-10-CM | POA: Diagnosis not present

## 2019-02-05 DIAGNOSIS — H40033 Anatomical narrow angle, bilateral: Secondary | ICD-10-CM | POA: Diagnosis not present

## 2019-02-06 DIAGNOSIS — Z0100 Encounter for examination of eyes and vision without abnormal findings: Secondary | ICD-10-CM | POA: Diagnosis not present

## 2019-02-06 DIAGNOSIS — Z7901 Long term (current) use of anticoagulants: Secondary | ICD-10-CM | POA: Diagnosis not present

## 2019-02-06 DIAGNOSIS — I255 Ischemic cardiomyopathy: Secondary | ICD-10-CM | POA: Diagnosis not present

## 2019-02-06 DIAGNOSIS — D6869 Other thrombophilia: Secondary | ICD-10-CM | POA: Diagnosis not present

## 2019-02-19 DIAGNOSIS — Z8673 Personal history of transient ischemic attack (TIA), and cerebral infarction without residual deficits: Secondary | ICD-10-CM | POA: Diagnosis not present

## 2019-02-19 DIAGNOSIS — R7301 Impaired fasting glucose: Secondary | ICD-10-CM | POA: Diagnosis not present

## 2019-02-19 DIAGNOSIS — D649 Anemia, unspecified: Secondary | ICD-10-CM | POA: Diagnosis not present

## 2019-02-19 DIAGNOSIS — Z7901 Long term (current) use of anticoagulants: Secondary | ICD-10-CM | POA: Diagnosis not present

## 2019-02-19 DIAGNOSIS — G14 Postpolio syndrome: Secondary | ICD-10-CM | POA: Diagnosis not present

## 2019-02-23 DIAGNOSIS — D225 Melanocytic nevi of trunk: Secondary | ICD-10-CM | POA: Diagnosis not present

## 2019-02-23 DIAGNOSIS — B078 Other viral warts: Secondary | ICD-10-CM | POA: Diagnosis not present

## 2019-02-23 DIAGNOSIS — L820 Inflamed seborrheic keratosis: Secondary | ICD-10-CM | POA: Diagnosis not present

## 2019-02-23 DIAGNOSIS — D485 Neoplasm of uncertain behavior of skin: Secondary | ICD-10-CM | POA: Diagnosis not present

## 2019-03-06 DIAGNOSIS — D6869 Other thrombophilia: Secondary | ICD-10-CM | POA: Diagnosis not present

## 2019-03-06 DIAGNOSIS — I255 Ischemic cardiomyopathy: Secondary | ICD-10-CM | POA: Diagnosis not present

## 2019-03-06 DIAGNOSIS — Z7901 Long term (current) use of anticoagulants: Secondary | ICD-10-CM | POA: Diagnosis not present

## 2019-03-12 DIAGNOSIS — D649 Anemia, unspecified: Secondary | ICD-10-CM | POA: Diagnosis not present

## 2019-03-12 DIAGNOSIS — Z8673 Personal history of transient ischemic attack (TIA), and cerebral infarction without residual deficits: Secondary | ICD-10-CM | POA: Diagnosis not present

## 2019-03-12 DIAGNOSIS — E7849 Other hyperlipidemia: Secondary | ICD-10-CM | POA: Diagnosis not present

## 2019-03-12 DIAGNOSIS — I130 Hypertensive heart and chronic kidney disease with heart failure and stage 1 through stage 4 chronic kidney disease, or unspecified chronic kidney disease: Secondary | ICD-10-CM | POA: Diagnosis not present

## 2019-03-12 DIAGNOSIS — G14 Postpolio syndrome: Secondary | ICD-10-CM | POA: Diagnosis not present

## 2019-03-12 DIAGNOSIS — Z7901 Long term (current) use of anticoagulants: Secondary | ICD-10-CM | POA: Diagnosis not present

## 2019-03-12 DIAGNOSIS — N183 Chronic kidney disease, stage 3 unspecified: Secondary | ICD-10-CM | POA: Diagnosis not present

## 2019-03-12 DIAGNOSIS — I5032 Chronic diastolic (congestive) heart failure: Secondary | ICD-10-CM | POA: Diagnosis not present

## 2019-03-12 DIAGNOSIS — R7301 Impaired fasting glucose: Secondary | ICD-10-CM | POA: Diagnosis not present

## 2019-03-13 ENCOUNTER — Ambulatory Visit (HOSPITAL_COMMUNITY)
Admission: RE | Admit: 2019-03-13 | Discharge: 2019-03-13 | Disposition: A | Discharge status: Home / Self Care | Payer: Medicare HMO | Source: Ambulatory Visit | Attending: Cardiology | Admitting: Cardiology

## 2019-03-13 ENCOUNTER — Other Ambulatory Visit: Payer: Self-pay

## 2019-03-13 DIAGNOSIS — I255 Ischemic cardiomyopathy: Secondary | ICD-10-CM | POA: Insufficient documentation

## 2019-03-13 MED ORDER — PERFLUTREN LIPID MICROSPHERE
1.0000 mL | INTRAVENOUS | Status: AC | PRN
  Administered 2019-03-13: 2 mL via INTRAVENOUS
  Filled 2019-03-13: qty 10

## 2019-03-13 NOTE — Progress Notes (Signed)
*  PRELIMINARY RESULTS* Echocardiogram 2D Echocardiogram has been performed with Definity.  Benjamin Cordova 03/13/2019, 2:54 PM

## 2019-03-20 ENCOUNTER — Ambulatory Visit: Payer: Medicare HMO | Admitting: Cardiology

## 2019-03-20 ENCOUNTER — Encounter: Payer: Self-pay | Admitting: Cardiology

## 2019-03-20 ENCOUNTER — Other Ambulatory Visit: Payer: Self-pay

## 2019-03-20 VITALS — BP 118/67 | HR 88 | Temp 95.5°F | Ht 68.0 in | Wt 242.0 lb

## 2019-03-20 DIAGNOSIS — I255 Ischemic cardiomyopathy: Secondary | ICD-10-CM | POA: Diagnosis not present

## 2019-03-20 DIAGNOSIS — I10 Essential (primary) hypertension: Secondary | ICD-10-CM | POA: Diagnosis not present

## 2019-03-20 DIAGNOSIS — I25119 Atherosclerotic heart disease of native coronary artery with unspecified angina pectoris: Secondary | ICD-10-CM

## 2019-03-20 DIAGNOSIS — E782 Mixed hyperlipidemia: Secondary | ICD-10-CM | POA: Diagnosis not present

## 2019-03-20 MED ORDER — SPIRONOLACTONE 25 MG PO TABS: 25.0000 mg | ORAL_TABLET | Freq: Every day | ORAL | 3 refills | Status: DC

## 2019-03-20 NOTE — Progress Notes (Signed)
Cardiology Office Note  Date: 03/20/2019   ID: Benjamin Cordova, DOB October 05, 1934, MRN [id number]  PCP:  Celene Squibb, MD  Cardiologist:  Rozann Lesches, MD Electrophysiologist:  None   Chief Complaint  Patient presents with  . Cardiac follow-up    History of Present Illness: Benjamin Cordova is an 84 y.o. male last assessed via telehealth encounter in August.  He presents for a follow-up visit.  He does not report any angina symptoms on current therapy, is fairly sedentary, uses a rolling walker at this point.  He has been staying around his home during the pandemic and has felt depressed following the death of his spouse.  He remains on Coumadin with follow-up by Dr. Nevada Crane.  He does not report any spontaneous bleeding problems or changes in his stool.  Follow-up echocardiogram done recently revealed LVEF 30 to 35% range, down from 40 to 50% range by previous study in December 2019.  I discussed these results with him.  I suspect he has manifested graft disease over time, although not reporting progressive angina.  He did tolerate the changes we made in his medications.  Current cardiac regimen includes Coreg, Entresto, Lasix, potassium supplements, and Zocor.  I personally reviewed his ECG today which shows sinus rhythm with IVCD, old anterior infarct pattern.  Past Medical History:  Diagnosis Date  . Arthritis   . Coronary artery disease    Multivessel s/p CABG [facility] at [facility]  . Essential hypertension   . Hematuria   . History of blood transfusion 07/2015   Knee replacement HgB <9  . History of pneumonia   . History of stroke   . Mixed hyperlipidemia   . Myocardial infarction ([place]) 1980  . Peripheral vascular disease ([place])   . Polio osteopathy of lower leg ([place]) Age 33   Affected right leg  . Skin cancer   . TIA (transient ischemic attack) 1989   Chronic coumadin  . UTI (lower urinary tract infection)     Past Surgical History:  Procedure Laterality Date  . CARDIAC  CATHETERIZATION N/A 10/05/2014   Procedure: Left Heart Cath and Coronary Angiography;  Surgeon: Peter M Martinique, MD;  Location: [place] CV LAB;  Service: Cardiovascular;  Laterality: N/A;  . CORONARY ARTERY BYPASS GRAFT  1981   [facility]  . CORONARY ARTERY BYPASS GRAFT N/A 10/08/2014   Procedure: REDO CORONARY ARTERY BYPASS GRAFTING (CABG) Times Two Grafts using Left Internal Mammary and Left Radial Artery;  Surgeon: Melrose Nakayama, MD;  Location: [place];  Service: Open Heart Surgery;  Laterality: N/A;  . CYSTOSCOPY WITH INJECTION  10/10/2010   Procedure: CYSTOSCOPY WITH INJECTION;  Surgeon: Marissa Nestle;  Location: [facility];  Service: Urology;  Laterality: N/A;  with retrograde urethrogram  . FOOT SURGERY Right 1954   [facility], muscle implantation  . JOINT REPLACEMENT    . RADIAL ARTERY HARVEST Left 10/08/2014   Procedure: Left RADIAL ARTERY HARVEST;  Surgeon: Melrose Nakayama, MD;  Location: [place];  Service: Open Heart Surgery;  Laterality: Left;  . TEE WITHOUT CARDIOVERSION N/A 10/08/2014   Procedure: TRANSESOPHAGEAL ECHOCARDIOGRAM (TEE);  Surgeon: Melrose Nakayama, MD;  Location: [place];  Service: Open Heart Surgery;  Laterality: N/A;  . TONSILLECTOMY  1949   [facility]  . TOTAL KNEE ARTHROPLASTY Left 07/27/2015   Procedure: TOTAL LEFT KNEE ARTHROPLASTY;  Surgeon: [name], MD;  Location: [place];  Service: Orthopedics;  Laterality: Left;    Current Outpatient Medications  Medication Sig Dispense Refill  . B Complex-C-Folic Acid (SUPER B-COMPLEX/VIT C/FA) TABS Take 1 tablet by mouth daily.      . carvedilol (COREG) 12.5 MG tablet TAKE 1 TABLET TWICE DAILY  (STOP  ATENOLOL) 180 tablet 3  . diphenhydramine-acetaminophen (TYLENOL PM) 25-500 MG TABS tablet Take 2 tablets by mouth at bedtime as needed.    . folic acid (FOLVITE) Q000111Q MCG tablet Take 400 mcg by mouth daily.     . furosemide (LASIX) 20 MG tablet TAKE 1 TABLET BY MOUTH ONCE DAILY FOR HEART    . Multiple Vitamins-Minerals  (CERTAVITE/ANTIOXIDANTS) TABS Take 1 tablet by mouth daily.     . Omega-3 Fatty Acids (FISH OIL) 500 MG CAPS Take 500 mg by mouth at bedtime.    . potassium chloride (K-DUR) 10 MEQ tablet TAKE BY MOUTH DAILY WITH FLUID PILL    . sacubitril-valsartan (ENTRESTO) 24-26 MG Take 1 tablet by mouth 2 (two) times daily. 60 tablet 6  . Saw Palmetto 500 MG CAPS Take 1 capsule by mouth 2 (two) times daily.    . simvastatin (ZOCOR) 40 MG tablet Take 40 mg by mouth daily.    . tamsulosin (FLOMAX) 0.4 MG CAPS capsule Take 0.4 mg by mouth daily.    . vitamin C (ASCORBIC ACID) 500 MG tablet Take 500 mg by mouth 2 (two) times daily.     Marland Kitchen warfarin (COUMADIN) 5 MG tablet Take 1.5 tablets (7.5 mg total) by mouth daily at 6 PM. OR AS DIRECTED by MD. (Patient taking differently: Take 7.5 mg by mouth daily at 6 PM. MANAGED MY PMD) 60 tablet 0  . spironolactone (ALDACTONE) 25 MG tablet Take 1 tablet (25 mg total) by mouth daily. 90 tablet 3   No current facility-administered medications for this visit.   Allergies:  Patient has no known allergies.   Social History: The patient  reports that he quit smoking about 40 years ago. His smoking use included cigarettes. He has a 28.00 pack-year smoking history. He has never used smokeless tobacco. He reports that he does not drink alcohol or use drugs.   ROS:  Please see the history of present illness. Otherwise, complete review of systems is positive for chronic hearing loss.  All other systems are reviewed and negative.   Physical Exam: VS:  BP 118/67   Pulse 88   Temp (!) 95.5 F (35.3 C)   Ht 5\' 8"  (1.727 m)   Wt 242 lb (109.8 kg)   SpO2 97%   BMI 36.80 kg/m , BMI Body mass index is 36.8 kg/m.  Wt Readings from Last 3 Encounters:  03/20/19 242 lb (109.8 kg)  02/03/19 240 lb (108.9 kg)  11/18/18 235 lb (106.6 kg)    General: Elderly male, appears comfortable at rest. HEENT: Conjunctiva and lids normal, wearing a mask. Neck: Supple, no elevated JVP or  carotid bruits, no thyromegaly. Lungs: Clear to auscultation, nonlabored breathing at rest. Cardiac: Regular rate and rhythm, no S3, soft systolic murmur. Abdomen: Soft, nontender, bowel sounds present, no guarding or rebound. Extremities: Trace ankle edema, distal pulses 2+. Skin: Warm and dry. Musculoskeletal: No kyphosis. Neuropsychiatric: Alert and oriented x3, affect grossly appropriate.  ECG:  An ECG dated 11/11/2017 was personally reviewed today and demonstrated:  Sinus rhythm with IVCD and repolarization abnormalities.  Recent Labwork: 02/03/2019: BUN 32; Creatinine, Ser 0.97; Hemoglobin 11.8; Platelets 145; Potassium 3.7; Sodium 138  November 2019: Cholesterol 118, triglycerides 124, HDL 40, LDL 53  Other Studies Reviewed Today:  Echocardiogram 03/13/2019:  1. Left ventricular ejection fraction, by visual estimation, is 30 to 35%. The left ventricle has low normal function. There is mildly increased left ventricular hypertrophy.  2. Definity contrast agent was given IV to delineate the left ventricular endocardial borders.  3. Left ventricular diastolic parameters are indeterminate.  4. Mildly dilated left ventricular internal cavity size.  5. The left ventricle demonstrates global hypokinesis.  6. The LV apex is akinetic.  7. Global right ventricle has normal systolic function.The right ventricular size is normal. No increase in right ventricular wall thickness.  8. Left atrial size was moderately dilated.  9. Right atrial size was not well visualized. 10. The mitral valve was not well visualized. Mild mitral valve regurgitation. No evidence of mitral stenosis. 11. The tricuspid valve is not well visualized. Tricuspid valve regurgitation is not demonstrated. 12. The aortic valve is tricuspid. Aortic valve regurgitation is not visualized. No evidence of aortic valve sclerosis or stenosis. 13. The pulmonic valve was not well visualized. Pulmonic valve regurgitation is not  visualized. 14. Mildly elevated pulmonary artery systolic pressure. 15. The interatrial septum was not well visualized.  Assessment and Plan:  1.  Ischemic cardiomyopathy, LVEF down to the range of 30 to 35% despite medication adjustments.  He is currently on Coreg, Entresto, and Lasix with potassium supplements.  Stopping HCTZ with plan to start Aldactone 25 mg daily.  He does not report active angina although I do suspect graft disease over time.  For now he seems most comfortable with medical therapy adjustments, I did talk with him about the possibility of a cardiac catheterization to assess for revascularization options, although at this point he seems most comfortable with a nonaggressive approach.  2.  Multivessel CAD status post CABG and redo operation as of 2016.  Continue above medications as well as statin therapy.  3.  Mixed hyperlipidemia on Zocor.  Continues to follow with Dr. Nevada Crane.  Last LDL was 53.  4.  Previous history of TIA/stroke.  He is on chronic Coumadin per Dr. Nevada Crane.  5.  Essential hypertension, blood pressure well controlled today.  Medication Adjustments/Labs and Tests Ordered: Current medicines are reviewed at length with the patient today.  Concerns regarding medicines are outlined above.   Tests Ordered: Orders Placed This Encounter  Procedures  . Basic Metabolic Panel (BMET)  . EKG 12-Lead    Medication Changes: Meds ordered this encounter  Medications  . spironolactone (ALDACTONE) 25 MG tablet    Sig: Take 1 tablet (25 mg total) by mouth daily.    Dispense:  90 tablet    Refill:  3    Disposition:  Follow up 3 months in the [place] office.  Signed, Satira Sark, MD, [facility] 03/20/2019 1:29 PM    [facility] [address] [place], [place] [address] Phone: [phone]; Fax: [phone]

## 2019-03-20 NOTE — Patient Instructions (Signed)
Medication Instructions:  STOP HCTZ   START Aldactone 25 mg daily  *If you need a refill on your cardiac medications before your next appointment, please call your pharmacy*  Lab Work: BMET in 10 days  If you have labs (blood work) drawn today and your tests are completely normal, you will receive your results only by: Marland Kitchen MyChart Message (if you have MyChart) OR . A paper copy in the mail If you have any lab test that is abnormal or we need to change your treatment, we will call you to review the results.  Testing/Procedures: None today  Follow-Up: At [facility], you and your health needs are our priority.  As part of our continuing mission to provide you with exceptional heart care, we have created designated Provider Care Teams.  These Care Teams include your primary Cardiologist (physician) and Advanced Practice Providers (APPs -  Physician Assistants and Nurse Practitioners) who all work together to provide you with the care you need, when you need it.  Your next appointment:   3 month(s)  The format for your next appointment:   In Person  Provider:   Bernerd Pho, PA-C  Other Instructions None      Thank you for choosing [facility] !

## 2019-03-30 DIAGNOSIS — Z0000 Encounter for general adult medical examination without abnormal findings: Secondary | ICD-10-CM | POA: Diagnosis not present

## 2019-03-30 DIAGNOSIS — R7303 Prediabetes: Secondary | ICD-10-CM | POA: Diagnosis not present

## 2019-03-30 DIAGNOSIS — I5022 Chronic systolic (congestive) heart failure: Secondary | ICD-10-CM | POA: Diagnosis not present

## 2019-03-30 DIAGNOSIS — D631 Anemia in chronic kidney disease: Secondary | ICD-10-CM | POA: Diagnosis not present

## 2019-03-30 DIAGNOSIS — Z96652 Presence of left artificial knee joint: Secondary | ICD-10-CM | POA: Diagnosis not present

## 2019-03-30 DIAGNOSIS — I129 Hypertensive chronic kidney disease with stage 1 through stage 4 chronic kidney disease, or unspecified chronic kidney disease: Secondary | ICD-10-CM | POA: Diagnosis not present

## 2019-03-30 DIAGNOSIS — E785 Hyperlipidemia, unspecified: Secondary | ICD-10-CM | POA: Diagnosis not present

## 2019-03-30 DIAGNOSIS — I5032 Chronic diastolic (congestive) heart failure: Secondary | ICD-10-CM | POA: Diagnosis not present

## 2019-03-30 DIAGNOSIS — I5030 Unspecified diastolic (congestive) heart failure: Secondary | ICD-10-CM | POA: Diagnosis not present

## 2019-04-09 DIAGNOSIS — D649 Anemia, unspecified: Secondary | ICD-10-CM | POA: Diagnosis not present

## 2019-04-09 DIAGNOSIS — G14 Postpolio syndrome: Secondary | ICD-10-CM | POA: Diagnosis not present

## 2019-04-09 DIAGNOSIS — R7301 Impaired fasting glucose: Secondary | ICD-10-CM | POA: Diagnosis not present

## 2019-04-09 DIAGNOSIS — Z8673 Personal history of transient ischemic attack (TIA), and cerebral infarction without residual deficits: Secondary | ICD-10-CM | POA: Diagnosis not present

## 2019-04-09 DIAGNOSIS — Z7901 Long term (current) use of anticoagulants: Secondary | ICD-10-CM | POA: Diagnosis not present

## 2019-04-17 DIAGNOSIS — N401 Enlarged prostate with lower urinary tract symptoms: Secondary | ICD-10-CM | POA: Diagnosis not present

## 2019-04-17 DIAGNOSIS — R3914 Feeling of incomplete bladder emptying: Secondary | ICD-10-CM | POA: Diagnosis not present

## 2019-04-28 DIAGNOSIS — R7301 Impaired fasting glucose: Secondary | ICD-10-CM | POA: Diagnosis not present

## 2019-04-28 DIAGNOSIS — G14 Postpolio syndrome: Secondary | ICD-10-CM | POA: Diagnosis not present

## 2019-04-28 DIAGNOSIS — Z8673 Personal history of transient ischemic attack (TIA), and cerebral infarction without residual deficits: Secondary | ICD-10-CM | POA: Diagnosis not present

## 2019-04-28 DIAGNOSIS — E7849 Other hyperlipidemia: Secondary | ICD-10-CM | POA: Diagnosis not present

## 2019-04-28 DIAGNOSIS — I5030 Unspecified diastolic (congestive) heart failure: Secondary | ICD-10-CM | POA: Diagnosis not present

## 2019-04-28 DIAGNOSIS — D649 Anemia, unspecified: Secondary | ICD-10-CM | POA: Diagnosis not present

## 2019-04-28 DIAGNOSIS — Z7901 Long term (current) use of anticoagulants: Secondary | ICD-10-CM | POA: Diagnosis not present

## 2019-04-28 DIAGNOSIS — N183 Chronic kidney disease, stage 3 unspecified: Secondary | ICD-10-CM | POA: Diagnosis not present

## 2019-04-28 DIAGNOSIS — I130 Hypertensive heart and chronic kidney disease with heart failure and stage 1 through stage 4 chronic kidney disease, or unspecified chronic kidney disease: Secondary | ICD-10-CM | POA: Diagnosis not present

## 2019-05-19 DIAGNOSIS — I5032 Chronic diastolic (congestive) heart failure: Secondary | ICD-10-CM | POA: Diagnosis not present

## 2019-05-19 DIAGNOSIS — Z8673 Personal history of transient ischemic attack (TIA), and cerebral infarction without residual deficits: Secondary | ICD-10-CM | POA: Diagnosis not present

## 2019-05-19 DIAGNOSIS — R5383 Other fatigue: Secondary | ICD-10-CM | POA: Diagnosis not present

## 2019-05-19 DIAGNOSIS — D649 Anemia, unspecified: Secondary | ICD-10-CM | POA: Diagnosis not present

## 2019-05-19 DIAGNOSIS — E782 Mixed hyperlipidemia: Secondary | ICD-10-CM | POA: Diagnosis not present

## 2019-05-19 DIAGNOSIS — N400 Enlarged prostate without lower urinary tract symptoms: Secondary | ICD-10-CM | POA: Diagnosis not present

## 2019-05-19 DIAGNOSIS — I10 Essential (primary) hypertension: Secondary | ICD-10-CM | POA: Diagnosis not present

## 2019-05-19 DIAGNOSIS — D631 Anemia in chronic kidney disease: Secondary | ICD-10-CM | POA: Diagnosis not present

## 2019-05-19 DIAGNOSIS — I5030 Unspecified diastolic (congestive) heart failure: Secondary | ICD-10-CM | POA: Diagnosis not present

## 2019-05-19 DIAGNOSIS — R7303 Prediabetes: Secondary | ICD-10-CM | POA: Diagnosis not present

## 2019-05-19 DIAGNOSIS — D509 Iron deficiency anemia, unspecified: Secondary | ICD-10-CM | POA: Diagnosis not present

## 2019-05-19 DIAGNOSIS — Z7901 Long term (current) use of anticoagulants: Secondary | ICD-10-CM | POA: Diagnosis not present

## 2019-05-22 DIAGNOSIS — G14 Postpolio syndrome: Secondary | ICD-10-CM | POA: Diagnosis not present

## 2019-05-22 DIAGNOSIS — E782 Mixed hyperlipidemia: Secondary | ICD-10-CM | POA: Diagnosis not present

## 2019-05-22 DIAGNOSIS — Z8673 Personal history of transient ischemic attack (TIA), and cerebral infarction without residual deficits: Secondary | ICD-10-CM | POA: Diagnosis not present

## 2019-05-22 DIAGNOSIS — R7301 Impaired fasting glucose: Secondary | ICD-10-CM | POA: Diagnosis not present

## 2019-05-22 DIAGNOSIS — R7303 Prediabetes: Secondary | ICD-10-CM | POA: Diagnosis not present

## 2019-05-22 DIAGNOSIS — R945 Abnormal results of liver function studies: Secondary | ICD-10-CM | POA: Diagnosis not present

## 2019-05-22 DIAGNOSIS — I129 Hypertensive chronic kidney disease with stage 1 through stage 4 chronic kidney disease, or unspecified chronic kidney disease: Secondary | ICD-10-CM | POA: Diagnosis not present

## 2019-05-22 DIAGNOSIS — Z0001 Encounter for general adult medical examination with abnormal findings: Secondary | ICD-10-CM | POA: Diagnosis not present

## 2019-05-22 DIAGNOSIS — Z7901 Long term (current) use of anticoagulants: Secondary | ICD-10-CM | POA: Diagnosis not present

## 2019-05-28 DIAGNOSIS — R7301 Impaired fasting glucose: Secondary | ICD-10-CM | POA: Diagnosis not present

## 2019-05-28 DIAGNOSIS — Z8673 Personal history of transient ischemic attack (TIA), and cerebral infarction without residual deficits: Secondary | ICD-10-CM | POA: Diagnosis not present

## 2019-05-28 DIAGNOSIS — G14 Postpolio syndrome: Secondary | ICD-10-CM | POA: Diagnosis not present

## 2019-05-28 DIAGNOSIS — Z7901 Long term (current) use of anticoagulants: Secondary | ICD-10-CM | POA: Diagnosis not present

## 2019-05-28 DIAGNOSIS — D649 Anemia, unspecified: Secondary | ICD-10-CM | POA: Diagnosis not present

## 2019-05-28 DIAGNOSIS — I482 Chronic atrial fibrillation, unspecified: Secondary | ICD-10-CM | POA: Diagnosis not present

## 2019-06-18 ENCOUNTER — Encounter: Payer: Self-pay | Admitting: Family Medicine

## 2019-06-18 ENCOUNTER — Ambulatory Visit (INDEPENDENT_AMBULATORY_CARE_PROVIDER_SITE_OTHER): Payer: Medicare HMO | Admitting: Family Medicine

## 2019-06-18 VITALS — BP 132/84 | HR 88 | Temp 97.5°F | Ht 68.0 in | Wt 248.0 lb

## 2019-06-18 DIAGNOSIS — Z79899 Other long term (current) drug therapy: Secondary | ICD-10-CM | POA: Diagnosis not present

## 2019-06-18 DIAGNOSIS — E782 Mixed hyperlipidemia: Secondary | ICD-10-CM | POA: Diagnosis not present

## 2019-06-18 DIAGNOSIS — I255 Ischemic cardiomyopathy: Secondary | ICD-10-CM | POA: Diagnosis not present

## 2019-06-18 DIAGNOSIS — I10 Essential (primary) hypertension: Secondary | ICD-10-CM | POA: Diagnosis not present

## 2019-06-18 DIAGNOSIS — I25119 Atherosclerotic heart disease of native coronary artery with unspecified angina pectoris: Secondary | ICD-10-CM | POA: Diagnosis not present

## 2019-06-18 MED ORDER — ENTRESTO 49-51 MG PO TABS: 1.0000 | ORAL_TABLET | Freq: Two times a day (BID) | ORAL | 3 refills | Status: DC

## 2019-06-18 NOTE — Patient Instructions (Signed)
Medication Instructions:  Your physician has recommended you make the following change in your medication:  Increase Entresto to 49-51 mg two Times Daily.   *If you need a refill on your cardiac medications before your next appointment, please call your pharmacy*   Lab Work: Your physician recommends that you return for lab work in: 2 Weeks (07/02/19)   If you have labs (blood work) drawn today and your tests are completely normal, you will receive your results only by: Marland Kitchen MyChart Message (if you have MyChart) OR . A paper copy in the mail If you have any lab test that is abnormal or we need to change your treatment, we will call you to review the results.   Testing/Procedures: NONE    Follow-Up: At [facility], you and your health needs are our priority.  As part of our continuing mission to provide you with exceptional heart care, we have created designated Provider Care Teams.  These Care Teams include your primary Cardiologist (physician) and Advanced Practice Providers (APPs -  Physician Assistants and Nurse Practitioners) who all work together to provide you with the care you need, when you need it.  We recommend signing up for the patient portal called "MyChart".  Sign up information is provided on this After Visit Summary.  MyChart is used to connect with patients for Virtual Visits (Telemedicine).  Patients are able to view lab/test results, encounter notes, upcoming appointments, etc.  Non-urgent messages can be sent to your provider as well.   To learn more about what you can do with MyChart, go to [email or web address].    Your next appointment:   3 month(s)  The format for your next appointment:   In Person  Provider:   Rozann Lesches, MD   Other Instructions Thank you for choosing [facility]!

## 2019-06-18 NOTE — Progress Notes (Signed)
Cardiology Office Note  Date: 06/18/2019   ID: Benjamin Cordova, DOB 09/30/1934, MRN [id number]  PCP:  Celene Squibb, MD  Cardiologist:  Rozann Lesches, MD Electrophysiologist:  None   Chief Complaint: CAD, ischemic cardiomyopathy, HTN, HLD.  History of Present Illness: Benjamin Cordova is a 85 y.o. male with a history of chronic systolic heart failure secondary to ischemic cardiomyopathy.  Multivessel CAD status post CABG and redo end 2016, history of HLD, HTN.  Last visit was via telemedicine on November 18, 2018.  He had been tolerating his Entresto at that point and was continue with Coreg, Lasix, and potassium supplementation.  He was not reporting any obvious angina at the time.  He was on Zocor and his last LDL was 72.  He denies any progressive anginal or exertional symptoms but does get mild dyspnea on moderate exertion.  He is not very active on daily basis because of history of polio affecting his right leg and previous left knee replacement.  He uses a Rollator to assist with ambulation.  He denies any orthostatic symptoms/syncope or near syncope.  No sensation of palpitations or arrhythmias, stroke or TIA-like symptoms, dyspeptic symptoms, or bleeding issues.  No claudication-like symptoms, DVT or PE-like symptoms, or lower extremity edema.  Past Medical History:  Diagnosis Date  . Arthritis   . Coronary artery disease    Multivessel s/p CABG 1981 at [facility]  . Essential hypertension   . Hematuria   . History of blood transfusion 07/2015   Knee replacement HgB <9  . History of pneumonia   . History of stroke   . Mixed hyperlipidemia   . Myocardial infarction ([place]) 1980  . Peripheral vascular disease ([place])   . Polio osteopathy of lower leg ([place]) Age 64   Affected right leg  . Skin cancer   . TIA (transient ischemic attack) 1989   Chronic coumadin  . UTI (lower urinary tract infection)     Past Surgical History:  Procedure Laterality Date  . CARDIAC CATHETERIZATION N/A  10/05/2014   Procedure: Left Heart Cath and Coronary Angiography;  Surgeon: Peter M Martinique, MD;  Location: [facility];  Service: Cardiovascular;  Laterality: N/A;  . CORONARY ARTERY BYPASS GRAFT  1981   [facility]  . CORONARY ARTERY BYPASS GRAFT N/A 10/08/2014   Procedure: REDO CORONARY ARTERY BYPASS GRAFTING (CABG) Times Two Grafts using Left Internal Mammary and Left Radial Artery;  Surgeon: Melrose Nakayama, MD;  Location: [facility];  Service: Open Heart Surgery;  Laterality: N/A;  . CYSTOSCOPY WITH INJECTION  10/10/2010   Procedure: CYSTOSCOPY WITH INJECTION;  Surgeon: Marissa Nestle;  Location: [facility];  Service: Urology;  Laterality: N/A;  with retrograde urethrogram  . FOOT SURGERY Right 1954   [facility], muscle implantation  . JOINT REPLACEMENT    . RADIAL ARTERY HARVEST Left 10/08/2014   Procedure: Left RADIAL ARTERY HARVEST;  Surgeon: Melrose Nakayama, MD;  Location: [facility];  Service: Open Heart Surgery;  Laterality: Left;  . TEE WITHOUT CARDIOVERSION N/A 10/08/2014   Procedure: TRANSESOPHAGEAL ECHOCARDIOGRAM (TEE);  Surgeon: Melrose Nakayama, MD;  Location: [facility];  Service: Open Heart Surgery;  Laterality: N/A;  . TONSILLECTOMY  1949   [facility]  . TOTAL KNEE ARTHROPLASTY Left 07/27/2015   Procedure: TOTAL LEFT KNEE ARTHROPLASTY;  Surgeon: [name], MD;  Location: [facility];  Service: Orthopedics;  Laterality: Left;    Current Outpatient Medications  Medication Sig Dispense Refill  .  B Complex-C-Folic Acid (SUPER B-COMPLEX/VIT C/FA) TABS Take 1 tablet by mouth daily.      . carvedilol (COREG) 12.5 MG tablet TAKE 1 TABLET TWICE DAILY  (STOP  ATENOLOL) 180 tablet 3  . diphenhydramine-acetaminophen (TYLENOL PM) 25-500 MG TABS tablet Take 2 tablets by mouth at bedtime as needed.    . folic acid (FOLVITE) [id number] MCG tablet Take 400 mcg by mouth daily.     . furosemide (LASIX) 20 MG tablet TAKE 1 TABLET BY MOUTH ONCE DAILY FOR HEART    . Multiple Vitamins-Minerals  (CERTAVITE/ANTIOXIDANTS) TABS Take 1 tablet by mouth daily.     . Omega-3 Fatty Acids (FISH OIL) 500 MG CAPS Take 500 mg by mouth at bedtime.    . potassium chloride (K-DUR) 10 MEQ tablet TAKE BY MOUTH DAILY WITH FLUID PILL    . Saw Palmetto 500 MG CAPS Take 1 capsule by mouth 2 (two) times daily.    . simvastatin (ZOCOR) 40 MG tablet Take 40 mg by mouth daily.    Marland Kitchen spironolactone (ALDACTONE) 25 MG tablet Take 1 tablet (25 mg total) by mouth daily. 90 tablet 3  . tamsulosin (FLOMAX) 0.4 MG CAPS capsule Take 0.4 mg by mouth daily.    . vitamin C (ASCORBIC ACID) 500 MG tablet Take 500 mg by mouth 2 (two) times daily.     Marland Kitchen warfarin (COUMADIN) 5 MG tablet Take 1.5 tablets (7.5 mg total) by mouth daily at 6 PM. OR AS DIRECTED by MD. (Patient taking differently: Take 7.5 mg by mouth daily at 6 PM. MANAGED MY PMD) 60 tablet 0  . sacubitril-valsartan (ENTRESTO) 49-51 MG Take 1 tablet by mouth 2 (two) times daily. 180 tablet 3   No current facility-administered medications for this visit.   Allergies:  Patient has no known allergies.   Social History: The patient  reports that he quit smoking about 40 years ago. His smoking use included cigarettes. He has a 28.00 pack-year smoking history. He has never used smokeless tobacco. He reports that he does not drink alcohol or use drugs.   Family History: The patient's family history includes Diabetes in his mother; Heart attack in his brother, father, and mother.   ROS:  Please see the history of present illness. Otherwise, complete review of systems is positive for none.  All other systems are reviewed and negative.   Physical Exam: VS:  BP 132/84   Pulse 88 Comment: irregular  Temp (!) 97.5 F (36.4 C)   Ht 5\' 8"  (1.727 m)   Wt 248 lb (112.5 kg)   BMI 37.71 kg/m , BMI Body mass index is 37.71 kg/m.  Wt Readings from Last 3 Encounters:  06/18/19 248 lb (112.5 kg)  03/20/19 242 lb (109.8 kg)  02/03/19 240 lb (108.9 kg)    General: Patient  appears comfortable at rest. Neck: Supple, no elevated JVP or carotid bruits, no thyromegaly. Lungs: Clear to auscultation, nonlabored breathing at rest. Cardiac: Regular rate and rhythm, no S3 or significant systolic murmur, no pericardial rub. Extremities: No pitting edema, distal pulses 2+. Skin: Warm and dry. Neuropsychiatric: Alert and oriented x3, affect grossly appropriate.  ECG:  None today  Recent Labwork: 02/03/2019: BUN 32; Creatinine, Ser 0.97; Hemoglobin 11.8; Platelets 145; Potassium 3.7; Sodium 138     Component Value Date/Time   CHOL 118 (L) 03/05/2015 0834   TRIG 104 03/05/2015 0834   HDL 39 (L) 03/05/2015 0834   CHOLHDL 3.0 03/05/2015 0834   VLDL 21 03/05/2015 [id number]  [place] 58 03/05/2015 0834    Other Studies Reviewed Today: Echocardiogram 03/13/2019  1. Left ventricular ejection fraction, by visual estimation, is 30 to 35%. The left ventricle has low normal function. There is mildly increased left ventricular hypertrophy. 2. Definity contrast agent was given IV to delineate the left ventricular endocardial borders. 3. Left ventricular diastolic parameters are indeterminate. 4. Mildly dilated left ventricular internal cavity size. 5. The left ventricle demonstrates global hypokinesis. 6. The LV apex is akinetic. 7. Global right ventricle has normal systolic function.The right ventricular size is normal. No increase in right ventricular wall thickness. 8. Left atrial size was moderately dilated. 9. Right atrial size was not well visualized. 10. The mitral valve was not well visualized. Mild mitral valve regurgitation. No evidence of mitral stenosis. 11. The tricuspid valve is not well visualized. Tricuspid valve regurgitation is not demonstrated. 12. The aortic valve is tricuspid. Aortic valve regurgitation is not visualized. No evidence of aortic valve sclerosis or stenosis. 13. The pulmonic valve was not well visualized. Pulmonic valve regurgitation is not  visualized. 14. Mildly elevated pulmonary artery systolic pressure. 15. The interatrial septum was not well visualized.  Assessment and Plan:  1. Ischemic cardiomyopathy   2. Coronary artery disease involving native coronary artery of native heart with angina pectoris ([place])   3. Mixed hyperlipidemia   4. Essential hypertension   5. Medication management    1. Ischemic cardiomyopathy Most recent echo March 13, 2019 showed an EF of 30 to 35%, mildly increased LVH, global hypokinesis, apex is akinetic, mild MR, mildly elevated PASP.Recent lab work 03/30/2019 showed a BUN of 30, creatinine 1.17, GFR of 58, sodium 137, potassium 4.6 after switching from HCTZ to Aldactone.  Increase Entresto to 49/51 mg p.o. twice daily.  Patient denies any progressive anginal or exertional symptoms.  He has gained approximately 4 pounds since 03/20/2019.  No evidence of lower extremity edema.  Lungs are CTA all fields.  Get a basic metabolic panel in 2 weeks.  Advised patient we may need to do a follow-up echocardiogram between 4 to 6 months in the future to assess EF.  2. Coronary artery disease involving native coronary artery of native heart with angina pectoris ([place]) History of CABG and redo in 2016.  He denies any progressive anginal or exertional symptoms.  He is not very active on a daily basis due to polio affecting right leg and previous left knee replacement.  He uses a Rollator to ambulate.  Continue carvedilol 12.5 mg p.o. twice daily,  3. Mixed hyperlipidemia Continue simvastatin 40 mg daily.  Managed by PCP  4. Essential hypertension Patient is normotensive today.  Blood pressure is 123/71.  Continue spironolactone 25 mg daily, Lasix 20 mg daily, potassium chloride 10 mEq daily.    Medication Adjustments/Labs and Tests Ordered: Current medicines are reviewed at length with the patient today.  Concerns regarding medicines are outlined above.   Disposition: Follow-up with Dr Domenic Polite or APP 3  months  Signed, Levell July, NP 06/18/2019 3:22 PM    [facility]

## 2019-06-22 ENCOUNTER — Telehealth: Payer: Self-pay | Admitting: Cardiology

## 2019-06-22 NOTE — Telephone Encounter (Signed)
Patient called wanting to know if he can double up on his Entresto.

## 2019-06-22 NOTE — Telephone Encounter (Signed)
Recent increase of Entresto to 49-51 mg bid and was told he could take 2 tablets of the 24/26 bid until he received new dose in the mail from [facility] and wanted to verify that this was ok - aware that he could take 2 tablets twice daily of the 24/26 mg until new dose comes in the mail - pt appreciative

## 2019-06-26 DIAGNOSIS — Z7901 Long term (current) use of anticoagulants: Secondary | ICD-10-CM | POA: Diagnosis not present

## 2019-06-26 DIAGNOSIS — Z8673 Personal history of transient ischemic attack (TIA), and cerebral infarction without residual deficits: Secondary | ICD-10-CM | POA: Diagnosis not present

## 2019-06-26 DIAGNOSIS — I482 Chronic atrial fibrillation, unspecified: Secondary | ICD-10-CM | POA: Diagnosis not present

## 2019-06-30 ENCOUNTER — Encounter (HOSPITAL_COMMUNITY): Payer: Self-pay

## 2019-06-30 ENCOUNTER — Inpatient Hospital Stay (HOSPITAL_COMMUNITY)
Admission: EM | Admit: 2019-06-30 | Discharge: 2019-07-04 | DRG: 291 | Disposition: A | Discharge status: Home Health | Payer: Medicare HMO | Attending: Internal Medicine | Admitting: Internal Medicine

## 2019-06-30 ENCOUNTER — Emergency Department (HOSPITAL_COMMUNITY): Payer: Medicare HMO

## 2019-06-30 ENCOUNTER — Other Ambulatory Visit: Payer: Self-pay

## 2019-06-30 DIAGNOSIS — I255 Ischemic cardiomyopathy: Secondary | ICD-10-CM | POA: Diagnosis present

## 2019-06-30 DIAGNOSIS — I739 Peripheral vascular disease, unspecified: Secondary | ICD-10-CM | POA: Diagnosis present

## 2019-06-30 DIAGNOSIS — I480 Paroxysmal atrial fibrillation: Secondary | ICD-10-CM | POA: Diagnosis not present

## 2019-06-30 DIAGNOSIS — I10 Essential (primary) hypertension: Secondary | ICD-10-CM | POA: Diagnosis not present

## 2019-06-30 DIAGNOSIS — Z85828 Personal history of other malignant neoplasm of skin: Secondary | ICD-10-CM

## 2019-06-30 DIAGNOSIS — I509 Heart failure, unspecified: Secondary | ICD-10-CM | POA: Diagnosis not present

## 2019-06-30 DIAGNOSIS — Z7901 Long term (current) use of anticoagulants: Secondary | ICD-10-CM | POA: Diagnosis not present

## 2019-06-30 DIAGNOSIS — I513 Intracardiac thrombosis, not elsewhere classified: Secondary | ICD-10-CM | POA: Diagnosis not present

## 2019-06-30 DIAGNOSIS — R000 Tachycardia, unspecified: Secondary | ICD-10-CM | POA: Diagnosis not present

## 2019-06-30 DIAGNOSIS — Z8612 Personal history of poliomyelitis: Secondary | ICD-10-CM

## 2019-06-30 DIAGNOSIS — Z79899 Other long term (current) drug therapy: Secondary | ICD-10-CM

## 2019-06-30 DIAGNOSIS — Z8249 Family history of ischemic heart disease and other diseases of the circulatory system: Secondary | ICD-10-CM | POA: Diagnosis not present

## 2019-06-30 DIAGNOSIS — Z8673 Personal history of transient ischemic attack (TIA), and cerebral infarction without residual deficits: Secondary | ICD-10-CM | POA: Diagnosis not present

## 2019-06-30 DIAGNOSIS — D649 Anemia, unspecified: Secondary | ICD-10-CM | POA: Diagnosis present

## 2019-06-30 DIAGNOSIS — E875 Hyperkalemia: Secondary | ICD-10-CM | POA: Diagnosis present

## 2019-06-30 DIAGNOSIS — R791 Abnormal coagulation profile: Secondary | ICD-10-CM | POA: Diagnosis present

## 2019-06-30 DIAGNOSIS — I252 Old myocardial infarction: Secondary | ICD-10-CM | POA: Diagnosis not present

## 2019-06-30 DIAGNOSIS — J9602 Acute respiratory failure with hypercapnia: Secondary | ICD-10-CM | POA: Diagnosis present

## 2019-06-30 DIAGNOSIS — J90 Pleural effusion, not elsewhere classified: Secondary | ICD-10-CM | POA: Diagnosis not present

## 2019-06-30 DIAGNOSIS — R0602 Shortness of breath: Secondary | ICD-10-CM | POA: Diagnosis not present

## 2019-06-30 DIAGNOSIS — R531 Weakness: Secondary | ICD-10-CM

## 2019-06-30 DIAGNOSIS — Z23 Encounter for immunization: Secondary | ICD-10-CM

## 2019-06-30 DIAGNOSIS — N400 Enlarged prostate without lower urinary tract symptoms: Secondary | ICD-10-CM | POA: Diagnosis present

## 2019-06-30 DIAGNOSIS — I130 Hypertensive heart and chronic kidney disease with heart failure and stage 1 through stage 4 chronic kidney disease, or unspecified chronic kidney disease: Principal | ICD-10-CM | POA: Diagnosis present

## 2019-06-30 DIAGNOSIS — E782 Mixed hyperlipidemia: Secondary | ICD-10-CM | POA: Diagnosis present

## 2019-06-30 DIAGNOSIS — I2510 Atherosclerotic heart disease of native coronary artery without angina pectoris: Secondary | ICD-10-CM | POA: Diagnosis present

## 2019-06-30 DIAGNOSIS — N179 Acute kidney failure, unspecified: Secondary | ICD-10-CM | POA: Diagnosis not present

## 2019-06-30 DIAGNOSIS — Z87891 Personal history of nicotine dependence: Secondary | ICD-10-CM | POA: Diagnosis not present

## 2019-06-30 DIAGNOSIS — I4891 Unspecified atrial fibrillation: Secondary | ICD-10-CM | POA: Diagnosis not present

## 2019-06-30 DIAGNOSIS — R0600 Dyspnea, unspecified: Secondary | ICD-10-CM

## 2019-06-30 DIAGNOSIS — Z951 Presence of aortocoronary bypass graft: Secondary | ICD-10-CM | POA: Diagnosis not present

## 2019-06-30 DIAGNOSIS — Z20822 Contact with and (suspected) exposure to covid-19: Secondary | ICD-10-CM | POA: Diagnosis not present

## 2019-06-30 DIAGNOSIS — I493 Ventricular premature depolarization: Secondary | ICD-10-CM | POA: Diagnosis present

## 2019-06-30 DIAGNOSIS — J9601 Acute respiratory failure with hypoxia: Secondary | ICD-10-CM | POA: Diagnosis not present

## 2019-06-30 DIAGNOSIS — I248 Other forms of acute ischemic heart disease: Secondary | ICD-10-CM | POA: Diagnosis not present

## 2019-06-30 DIAGNOSIS — R5381 Other malaise: Secondary | ICD-10-CM | POA: Diagnosis not present

## 2019-06-30 DIAGNOSIS — Z96652 Presence of left artificial knee joint: Secondary | ICD-10-CM | POA: Diagnosis present

## 2019-06-30 DIAGNOSIS — I5043 Acute on chronic combined systolic (congestive) and diastolic (congestive) heart failure: Secondary | ICD-10-CM | POA: Diagnosis present

## 2019-06-30 DIAGNOSIS — I25708 Atherosclerosis of coronary artery bypass graft(s), unspecified, with other forms of angina pectoris: Secondary | ICD-10-CM | POA: Diagnosis not present

## 2019-06-30 DIAGNOSIS — J9811 Atelectasis: Secondary | ICD-10-CM | POA: Diagnosis not present

## 2019-06-30 DIAGNOSIS — I5023 Acute on chronic systolic (congestive) heart failure: Secondary | ICD-10-CM | POA: Diagnosis not present

## 2019-06-30 DIAGNOSIS — R0689 Other abnormalities of breathing: Secondary | ICD-10-CM | POA: Diagnosis not present

## 2019-06-30 DIAGNOSIS — N1831 Chronic kidney disease, stage 3a: Secondary | ICD-10-CM | POA: Diagnosis not present

## 2019-06-30 DIAGNOSIS — E785 Hyperlipidemia, unspecified: Secondary | ICD-10-CM | POA: Diagnosis not present

## 2019-06-30 HISTORY — DX: Ischemic cardiomyopathy: I25.5

## 2019-06-30 LAB — CBC WITH DIFFERENTIAL/PLATELET
Abs Immature Granulocytes: 0.05 10*3/uL (ref 0.00–0.07)
Basophils Absolute: 0 10*3/uL (ref 0.0–0.1)
Basophils Relative: 1 %
Eosinophils Absolute: 0.1 10*3/uL (ref 0.0–0.5)
Eosinophils Relative: 2 %
HCT: 37.8 % — ABNORMAL LOW (ref 39.0–52.0)
Hemoglobin: 12.1 g/dL — ABNORMAL LOW (ref 13.0–17.0)
Immature Granulocytes: 1 %
Lymphocytes Relative: 9 %
Lymphs Abs: 0.6 10*3/uL — ABNORMAL LOW (ref 0.7–4.0)
MCH: 30.5 pg (ref 26.0–34.0)
MCHC: 32 g/dL (ref 30.0–36.0)
MCV: 95.2 fL (ref 80.0–100.0)
Monocytes Absolute: 0.5 10*3/uL (ref 0.1–1.0)
Monocytes Relative: 7 %
Neutro Abs: 5.4 10*3/uL (ref 1.7–7.7)
Neutrophils Relative %: 80 %
Platelets: 135 10*3/uL — ABNORMAL LOW (ref 150–400)
RBC: 3.97 MIL/uL — ABNORMAL LOW (ref 4.22–5.81)
RDW: 13.6 % (ref 11.5–15.5)
WBC: 6.6 10*3/uL (ref 4.0–10.5)
nRBC: 0 % (ref 0.0–0.2)

## 2019-06-30 LAB — BASIC METABOLIC PANEL
Anion gap: 8 (ref 5–15)
BUN: 42 mg/dL — ABNORMAL HIGH (ref 8–23)
CO2: 25 mmol/L (ref 22–32)
Calcium: 9 mg/dL (ref 8.9–10.3)
Chloride: 104 mmol/L (ref 98–111)
Creatinine, Ser: 1.4 mg/dL — ABNORMAL HIGH (ref 0.61–1.24)
GFR calc Af Amer: 54 mL/min — ABNORMAL LOW (ref >60)
GFR calc non Af Amer: 46 mL/min — ABNORMAL LOW (ref >60)
Glucose, Bld: 119 mg/dL — ABNORMAL HIGH (ref 70–99)
Potassium: 5 mmol/L (ref 3.5–5.1)
Sodium: 137 mmol/L (ref 135–145)

## 2019-06-30 LAB — PROTIME-INR
INR: 1.2 (ref 0.8–1.2)
Prothrombin Time: 15.2 seconds (ref 11.4–15.2)

## 2019-06-30 LAB — MAGNESIUM: Magnesium: 2.4 mg/dL (ref 1.7–2.4)

## 2019-06-30 LAB — BRAIN NATRIURETIC PEPTIDE: B Natriuretic Peptide: 630 pg/mL — ABNORMAL HIGH (ref 0.0–100.0)

## 2019-06-30 LAB — TROPONIN I (HIGH SENSITIVITY)
Troponin I (High Sensitivity): 14 ng/L (ref <18)
Troponin I (High Sensitivity): 14 ng/L (ref <18)

## 2019-06-30 MED ORDER — SODIUM CHLORIDE 0.9 % IV BOLUS
500.0000 mL | Freq: Once | INTRAVENOUS | Status: AC
  Administered 2019-06-30: 500 mL via INTRAVENOUS

## 2019-06-30 MED ORDER — IOHEXOL 300 MG/ML  SOLN
75.0000 mL | Freq: Once | INTRAMUSCULAR | Status: AC | PRN
  Administered 2019-06-30: 21:00:00 75 mL via INTRAVENOUS

## 2019-06-30 NOTE — ED Notes (Signed)
Pt. Stated he was having difficulty breathing before getting up to ambulate. Pt. Stated he did not feel comfortable getting up.

## 2019-06-30 NOTE — H&P (Signed)
History and Physical    Patient Demographics:    Benjamin Cordova [id number] DOB: December 12, 1934 DOA: 06/30/2019  PCP: Celene Squibb, MD  Patient coming from: Home  I have personally briefly reviewed patient's old medical records in [place]  Chief Complaint: Weakness, shortness of breath   Assessment & Plan:     Assessment/Plan Principal Problem:   Shortness of breath Active Problems:   Coronary artery disease   Hypertension   Peripheral vascular disease (HCC)   Paroxysmal atrial fibrillation (HCC)   S/P CABG x 2   Subtherapeutic international normalized ratio (INR)   CHF (congestive heart failure) ([place])   Weakness     Principal Problem:    Other Active Problems: Ischemic cardiomyopathy Patient has history of congestive heart failure with reduced ejection fraction.  Most recent echocardiogram in December 2020 showed an EF of 30 to 35%.  Follows with the cardiology as outpatient. -We will decrease Entresto dose 24/26  Acute renal failure Creatinine is increased to 1.4 on presentation.  Most recent creatinine was 0.9 in November 2020. Patient is on Entresto with a recent dose increase. -Monitor input output, renal function closely  Coronary artery disease s/p CABG History of multivessel CABG with subsequent redo in 2016.  Paroxysmal atrial fibrillation with history of apical thrombus Patient is on Coumadin.  INR is currently subtherapeutic at 1.2. -Continue Coumadin and monitor INR  Hypertension -Continue carvedilol with parameters  Hyperlipidemia -Continue simvastatin, omega-3 fatty acids  History of CVA -No residual deficits from CVA. Does report a history of polio with mild weakness in the right LE   Chronic normocytic anemia Hemoglobin 12.1.  Appears to be stable.  No evidence of bleeding. -Monitor CBC  BPH -Continue tamsulosin, finasteride  DVT prophylaxis: Warfarin Code Status:  Full code Family Communication: N/A  Disposition Plan:  Place in observation for evaluation of dyspnea, weakness Consults called: N/A Admission status: Observation stay    HPI:     HPI: Benjamin Cordova is a 85 y.o. male with medical history significant of ischemic cardiomyopathy, coronary artery disease, hypertension, hyperlipidemia, peripheral vascular disease, history of TIA who presented to the ER with increased weakness. ED Course:  Vital Signs reviewed on presentation, significant for Temperature 97.7, heart rate 108, blood pressure 116/59, saturation 93% on room air. Labs reviewed, significant for sodium 137, potassium 5.0, BUN 42, creatinine 1.4, BNP 630, troponin negative x2, WBC count 6.6, hemoglobin 12.1, hematocrit 37, platelets 135, INR 1.2. Imaging personally Reviewed, CT chest with IV contrast shows small loculated left pleural effusion with pleural thickening, likely chronic.  Rounded atelectasis in the left upper and lower lobes, faint groundglass opacities in the right lung suggesting mild edema. EKG personally reviewed, shows sinus tachycardia, PVCs as well as PACs.    Review of systems:    Review of Systems: As per HPI otherwise 10 point review of systems negative.  All other review of systems is negative except the ones noted above in the HPI.    Past Medical and Surgical History:  Reviewed by me  Past Medical History:  Diagnosis Date  . Arthritis   . Coronary artery disease    Multivessel s/p CABG [facility]  . Essential hypertension   . Hematuria   . History of blood transfusion 07/2015   Knee replacement HgB <9  . History of pneumonia   . History of stroke   . Mixed hyperlipidemia   . Myocardial infarction ([place]) 1980  . Peripheral vascular disease ([place])   .  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 Skin cancer   . TIA (transient ischemic attack) 1989   Chronic coumadin  . UTI (lower urinary tract infection)     Past Surgical History:  Procedure Laterality Date  . CARDIAC  CATHETERIZATION N/A 10/05/2014   Procedure: Left Heart Cath and Coronary Angiography;  Surgeon: Peter M Martinique, MD;  Location: [place] [facility];  Service: Cardiovascular;  Laterality: N/A;  . CORONARY ARTERY BYPASS GRAFT  1981   [facility]  . CORONARY ARTERY BYPASS GRAFT N/A 10/08/2014   Procedure: REDO CORONARY ARTERY BYPASS GRAFTING (CABG) Times Two Grafts using Left Internal Mammary and Left Radial Artery;  Surgeon: Melrose Nakayama, MD;  Location: [place];  Service: Open Heart Surgery;  Laterality: N/A;  . CYSTOSCOPY WITH INJECTION  10/10/2010   Procedure: CYSTOSCOPY WITH INJECTION;  Surgeon: Marissa Nestle;  Location: [facility];  Service: Urology;  Laterality: N/A;  with retrograde urethrogram  . FOOT SURGERY Right 1954   [facility], muscle implantation  . JOINT REPLACEMENT    . RADIAL ARTERY HARVEST Left 10/08/2014   Procedure: Left RADIAL ARTERY HARVEST;  Surgeon: Melrose Nakayama, MD;  Location: [place];  Service: Open Heart Surgery;  Laterality: Left;  . TEE WITHOUT CARDIOVERSION N/A 10/08/2014   Procedure: TRANSESOPHAGEAL ECHOCARDIOGRAM (TEE);  Surgeon: Melrose Nakayama, MD;  Location: [place];  Service: Open Heart Surgery;  Laterality: N/A;  . TONSILLECTOMY  1949   [facility]  . TOTAL KNEE ARTHROPLASTY Left 07/27/2015   Procedure: TOTAL LEFT KNEE ARTHROPLASTY;  Surgeon: [name], MD;  Location: [place];  Service: Orthopedics;  Laterality: Left;     Social History:  Reviewed by me   reports that he quit smoking about 40 years ago. His smoking use included cigarettes. He has a 28.00 pack-year smoking history. He has never used smokeless tobacco. He reports that he does not drink alcohol or use drugs.  Allergies:    No Known Allergies  Family History :   Family History  Problem Relation Age of Onset  . Heart attack Mother   . Diabetes Mother   . Heart attack Father   . Heart attack Brother   . Anesthesia problems Neg Hx   . Hypotension Neg Hx   . Malignant hyperthermia Neg  Hx   . Pseudochol deficiency Neg Hx    Family history reviewed, noted as above, not pertinent to current presentation.   Home Medications:    Prior to Admission medications   Medication Sig Start Date End Date Taking? Authorizing Provider  B Complex-C-Folic Acid (SUPER B-COMPLEX/VIT C/FA) TABS Take 1 tablet by mouth daily.      [provider]  carvedilol (COREG) 12.5 MG tablet TAKE 1 TABLET TWICE DAILY  (STOP  ATENOLOL) Patient taking differently: Take 12.5 mg by mouth 2 (two) times daily with a meal.  10/08/16   Satira Sark, MD  diphenhydramine-acetaminophen (TYLENOL PM) 25-500 MG TABS tablet Take 2 tablets by mouth at bedtime as needed.    [provider]  finasteride (PROSCAR) 5 MG tablet Take 5 mg by mouth daily. 04/20/19   [provider]  folic acid (FOLVITE) [id number] MCG tablet Take 400 mcg by mouth daily.     [provider]  furosemide (LASIX) 20 MG tablet Take 20 mg by mouth daily.  03/18/18   [provider]  Multiple Vitamins-Minerals (CERTAVITE/ANTIOXIDANTS) TABS Take 1 tablet by mouth daily.  [provider]  Omega-3 Fatty Acids (FISH OIL) 500 MG CAPS Take 500 mg by mouth at bedtime.    [provider]  potassium chloride (K-DUR) 10 MEQ tablet Take 10 mEq by mouth daily. Take with Lasix (Furosemide) 03/18/18   [provider]  sacubitril-valsartan (ENTRESTO) 49-51 MG Take 1 tablet by mouth 2 (two) times daily. 06/18/19   Verta Ellen., NP  Saw Palmetto 500 MG CAPS Take 1 capsule by mouth 2 (two) times daily.    [provider]  simvastatin (ZOCOR) 40 MG tablet Take 40 mg by mouth daily.    [provider]  spironolactone (ALDACTONE) 25 MG tablet Take 1 tablet (25 mg total) by mouth daily. 03/20/19 06/30/19  Satira Sark, MD  tamsulosin (FLOMAX) 0.4 MG CAPS capsule Take 0.4 mg by mouth daily. 12/02/18   [provider]  vitamin C (ASCORBIC ACID) 500 MG tablet Take 500 mg  by mouth 2 (two) times daily.     [provider]  warfarin (COUMADIN) 5 MG tablet Take 1.5 tablets (7.5 mg total) by mouth daily at 6 PM. OR AS DIRECTED by MD. Patient taking differently: Take 7.5 mg by mouth daily at 6 PM. MANAGED MY PMD 09/07/15   Modena Jansky, MD    Physical Exam:    Physical Exam: Vitals:   06/30/19 1812 06/30/19 1815 06/30/19 1900  BP:  105/71 (!) 110/53  Pulse:  (!) 115 (!) 42  Resp:  (!) 27 20  Temp:  97.7 F (36.5 C)   TempSrc:  Oral   SpO2:  95% 95%  Weight: 113.4 kg    Height: 5\' 8"  (1.727 m)      Constitutional: NAD, calm, comfortable Vitals:   06/30/19 1812 06/30/19 1815 06/30/19 1900  BP:  105/71 (!) 110/53  Pulse:  (!) 115 (!) 42  Resp:  (!) 27 20  Temp:  97.7 F (36.5 C)   TempSrc:  Oral   SpO2:  95% 95%  Weight: 113.4 kg    Height: 5\' 8"  (1.727 m)     Eyes: PERRL, lids and conjunctivae normal ENMT: Mucous membranes are moist. Posterior pharynx clear of any exudate or lesions.Normal dentition.  Neck: normal, supple, no masses, no thyromegaly Respiratory: clear to auscultation bilaterally, no wheezing, no crackles. Normal respiratory effort. No accessory muscle use.  Cardiovascular: Regular rate and rhythm, no murmurs / rubs / gallops. No extremity edema. 2+ pedal pulses. No carotid bruits.  Abdomen: no tenderness, no masses palpated. No hepatosplenomegaly. Bowel sounds positive.  Musculoskeletal: no clubbing / cyanosis. No joint deformity upper and lower extremities. Good ROM, no contractures. Normal muscle tone.  Skin: no rashes, lesions, ulcers. No induration Neurologic: CN 2-12 grossly intact. Sensation intact, DTR normal. Strength 5/5 in all 4.  Psychiatric: Normal judgment and insight. Alert and oriented x 3. Normal mood.    Decubitus Ulcers: Not present on admission Catheters and tubes: None  Data Review:    Labs on Admission: I have personally reviewed following labs and imaging studies  CBC: Recent Labs  Lab  06/30/19 1952  WBC 6.6  NEUTROABS 5.4  HGB 12.1*  HCT 37.8*  MCV 95.2  PLT [id number]*   Basic Metabolic Panel: Recent Labs  Lab 06/30/19 1952  NA 137  K 5.0  CL 104  CO2 25  GLUCOSE 119*  BUN 42*  CREATININE 1.40*  CALCIUM 9.0  MG 2.4   GFR: Estimated Creatinine Clearance: 49.7 mL/min (A) (by C-G formula based  on SCr of 1.4 mg/dL (H)). Liver Function Tests: No results for input(s): AST, ALT, ALKPHOS, BILITOT, PROT, ALBUMIN in the last 168 hours. No results for input(s): LIPASE, AMYLASE in the last 168 hours. No results for input(s): AMMONIA in the last 168 hours. Coagulation Profile: Recent Labs  Lab 06/30/19 1952  INR 1.2   Cardiac Enzymes: No results for input(s): CKTOTAL, CKMB, CKMBINDEX, TROPONINI in the last 168 hours. BNP (last 3 results) No results for input(s): PROBNP in the last 8760 hours. HbA1C: No results for input(s): HGBA1C in the last 72 hours. CBG: No results for input(s): GLUCAP in the last 168 hours. Lipid Profile: No results for input(s): CHOL, HDL, LDLCALC, TRIG, CHOLHDL, LDLDIRECT in the last 72 hours. Thyroid Function Tests: No results for input(s): TSH, T4TOTAL, FREET4, T3FREE, THYROIDAB in the last 72 hours. Anemia Panel: No results for input(s): VITAMINB12, FOLATE, FERRITIN, TIBC, IRON, RETICCTPCT in the last 72 hours. Urine analysis:    Component Value Date/Time   COLORURINE YELLOW 08/31/2015 0038   APPEARANCEUR CLEAR 08/31/2015 0038   LABSPEC 1.020 08/31/2015 0038   PHURINE 5.5 08/31/2015 0038   GLUCOSEU NEGATIVE 08/31/2015 0038   HGBUR SMALL (A) 08/31/2015 0038   BILIRUBINUR NEGATIVE 08/31/2015 0038   KETONESUR NEGATIVE 08/31/2015 0038   PROTEINUR NEGATIVE 08/31/2015 0038   UROBILINOGEN 1.0 10/07/2014 1638   NITRITE NEGATIVE 08/31/2015 0038   LEUKOCYTESUR MODERATE (A) 08/31/2015 0038     Imaging Results:      Radiological Exams on Admission: CT Chest W Contrast  Result Date: 06/30/2019 CLINICAL DATA:  Abnormal chest  radiograph, loculated pleural effusion EXAM: CT CHEST WITH CONTRAST TECHNIQUE: Multidetector CT imaging of the chest was performed during intravenous contrast administration. CONTRAST:  60mL OMNIPAQUE IOHEXOL 300 MG/ML  SOLN COMPARISON:  Chest radiograph dated 06/30/2019 FINDINGS: Cardiovascular: Heart is normal in size.  No pericardial effusion. No evidence of thoracic aortic aneurysm. Atherosclerotic calcifications of the aortic arch. Three vessel coronary atherosclerosis. Postsurgical changes related to prior CABG. Mediastinum/Nodes: Small mediastinal lymph nodes, including a 13 mm right azygoesophageal recess node and a 12 mm inter lobar right lower lobe node (series 2/image 70), likely reactive. Visualized thyroid is unremarkable. Lungs/Pleura: Subpleural scarring/rounded atelectasis in the left upper lobe (series 4/image 59). Rounded atelectasis in the left lower lobe (series 4/image 87). Small left pleural effusion with pleural thickening (series 2/image 118), favoring a chronic loculated pleural effusion (given the associated findings) over an empyema. Faint ground-glass opacities in the right upper and lower lobes (for example, series 4/image 47) suggest very mild edema or less likely infection. Mild right basilar opacity, likely atelectasis. Mild centrilobular and paraseptal emphysematous changes, right upper lobe predominant. No pneumothorax. Upper Abdomen: Visualized upper abdomen is grossly unremarkable. Musculoskeletal: Degenerative changes of the visualized thoracolumbar spine. Median sternotomy. IMPRESSION: Rounded atelectasis in the left upper and lower lobes, likely accounting for the radiographic abnormality. Small loculated left pleural effusion with pleural thickening, likely chronic. Empyema is considered unlikely given the associated findings. Faint ground-glass opacities in the right lung suggests mild edema or less likely infection. Aortic Atherosclerosis (ICD10-I70.0) and Emphysema  (ICD10-J43.9). Electronically Signed   By: Julian Hy M.D.   On: 06/30/2019 21:49   DG Chest Port 1 View  Result Date: 06/30/2019 CLINICAL DATA:  Dyspnea EXAM: PORTABLE CHEST 1 VIEW COMPARISON:  09/01/2015 FINDINGS: Post sternotomy changes. Probable trace right-sided pleural effusion. Small left loculated pleural effusion or pleural thickening. Irregular peripheral opacity in the left mid lung contiguous with the pleural surface. Consolidation  at the left base. Stable cardiomediastinal silhouette. No pneumothorax. IMPRESSION: 1. Left loculated pleural effusion or pleural thickening with irregular opacity in the left mid lung extending to the pleural surface, suggest chest CT for further evaluation. There is also airspace disease at the left base 2. Probable trace right pleural effusion Electronically Signed   By: Donavan Foil M.D.   On: 06/30/2019 20:20      Lynetta Mare MD [facility]  If 7PM-7AM, please contact night-coverage   06/30/2019, 11:51 PM

## 2019-06-30 NOTE — ED Provider Notes (Signed)
[facility] EMERGENCY DEPARTMENT Provider Note   CSN: [id number] Arrival date & time: 06/30/19  1810     History Chief Complaint  Patient presents with  . Weakness    CHIPPER GAILLARD is a 85 y.o. male.  HPI    85 year old male with generalized weakness.  Onset shortly before arrival.  Patient states that he was walking in his felt like he was going to collapse.  He felt weak all over.  No focal deficits.  Is having a hard time walking even with assistive aids.  Symptoms persisting currently.  Denies any acute pain.  Denies any dyspnea to me but his youngest son is at bedside and reports that his father seems short of breath as they have been talking.  He reports that he is otherwise been in his usual state of health over the past several days.  No fevers.  No vomiting or diarrhea.  No cough.  No sick contacts that he is aware of.  No specific urinary complaints.  He reports compliance with all his medications.  Delene Loll was recently increased.  Past Medical History:  Diagnosis Date  . Arthritis   . Coronary artery disease    Multivessel s/p CABG 1981 at [facility]  . Essential hypertension   . Hematuria   . History of blood transfusion 07/2015   Knee replacement HgB <9  . History of pneumonia   . History of stroke   . Mixed hyperlipidemia   . Myocardial infarction ([place]) 1980  . Peripheral vascular disease ([place])   . 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 Skin cancer   . TIA (transient ischemic attack) 1989   Chronic coumadin  . UTI (lower urinary tract infection)     Patient Active Problem List   Diagnosis Date Noted  . Subtherapeutic anticoagulation   . Acute on chronic respiratory failure with hypoxia and hypercapnia (HCC)   . Acute on chronic combined systolic and diastolic CHF (congestive heart failure) ([place])   . LV (left ventricular) mural thrombus 09/03/2015  . Systolic and diastolic CHF, acute on chronic ([place])   . Hypokalemia   . CHF  (congestive heart failure) ([place]) 09/01/2015  . Respiratory failure with hypoxia ([place]) 08/31/2015  . HCAP (healthcare-associated pneumonia) 08/31/2015  . Acute respiratory failure with hypoxia ([place])   . Encounter for central line placement   . Knee swelling   . Subtherapeutic international normalized ratio (INR) 08/08/2015  . Constipation 08/08/2015  . Hyperlipidemia 08/08/2015  . Blurred vision 07/29/2015  . Anemia 07/29/2015  . S/P total knee replacement 07/27/2015  . S/P CABG x 2 10/08/2014  . Chest pain 10/01/2014  . Elevated INR 10/01/2014  . Leukocytosis 10/01/2014  . Thrombocytopenia ([place]) 10/01/2014  . Chest pain at rest 10/01/2014  . Unstable angina ([place]) 10/01/2014  . Coagulopathy ([place]) 10/01/2014  . Paroxysmal atrial fibrillation ([place]) 10/01/2014  . Coronary artery disease   . Hypertension   . Arthritis   . Peripheral vascular disease ([place])   . Essential hypertension     Past Surgical History:  Procedure Laterality Date  . CARDIAC CATHETERIZATION N/A 10/05/2014   Procedure: Left Heart Cath and Coronary Angiography;  Surgeon: Peter M Martinique, MD;  Location: [place] [facility];  Service: Cardiovascular;  Laterality: N/A;  . CORONARY ARTERY BYPASS GRAFT  1981   [facility]  . CORONARY ARTERY BYPASS GRAFT N/A 10/08/2014   Procedure: REDO CORONARY ARTERY BYPASS GRAFTING (CABG) Times Two Grafts  using Left Internal Mammary and Left Radial Artery;  Surgeon: Melrose Nakayama, MD;  Location: [place];  Service: Open Heart Surgery;  Laterality: N/A;  . CYSTOSCOPY WITH INJECTION  10/10/2010   Procedure: CYSTOSCOPY WITH INJECTION;  Surgeon: Marissa Nestle;  Location: [facility];  Service: Urology;  Laterality: N/A;  with retrograde urethrogram  . FOOT SURGERY Right 1954   [facility], muscle implantation  . JOINT REPLACEMENT    . RADIAL ARTERY HARVEST Left 10/08/2014   Procedure: Left RADIAL ARTERY HARVEST;  Surgeon: Melrose Nakayama, MD;  Location: [place];  Service: Open Heart Surgery;   Laterality: Left;  . TEE WITHOUT CARDIOVERSION N/A 10/08/2014   Procedure: TRANSESOPHAGEAL ECHOCARDIOGRAM (TEE);  Surgeon: Melrose Nakayama, MD;  Location: [place];  Service: Open Heart Surgery;  Laterality: N/A;  . TONSILLECTOMY  1949   [facility]  . TOTAL KNEE ARTHROPLASTY Left 07/27/2015   Procedure: TOTAL LEFT KNEE ARTHROPLASTY;  Surgeon: [name], MD;  Location: [place];  Service: Orthopedics;  Laterality: Left;       Family History  Problem Relation Age of Onset  . Heart attack Mother   . Diabetes Mother   . Heart attack Father   . Heart attack Brother   . Anesthesia problems Neg Hx   . Hypotension Neg Hx   . Malignant hyperthermia Neg Hx   . Pseudochol deficiency Neg Hx     Social History   Tobacco Use  . Smoking status: Former Smoker    Packs/day: 1.00    Years: 28.00    Pack years: 28.00    Types: Cigarettes    Quit date: 09/22/1978    Years since quitting: 40.7  . Smokeless tobacco: Never Used  Substance Use Topics  . Alcohol use: No    Alcohol/week: 0.0 standard drinks  . Drug use: No    Home Medications Prior to Admission medications   Medication Sig Start Date End Date Taking? Authorizing Provider  B Complex-C-Folic Acid (SUPER B-COMPLEX/VIT C/FA) TABS Take 1 tablet by mouth daily.      [provider]  carvedilol (COREG) 12.5 MG tablet TAKE 1 TABLET TWICE DAILY  (STOP  ATENOLOL) 10/08/16   Satira Sark, MD  diphenhydramine-acetaminophen (TYLENOL PM) 25-500 MG TABS tablet Take 2 tablets by mouth at bedtime as needed.    [provider]  folic acid (FOLVITE) Q000111Q MCG tablet Take 400 mcg by mouth daily.     [provider]  furosemide (LASIX) 20 MG tablet TAKE 1 TABLET BY MOUTH ONCE DAILY FOR HEART 03/18/18   [provider]  Multiple Vitamins-Minerals (CERTAVITE/ANTIOXIDANTS) TABS Take 1 tablet by mouth daily.     [provider]  Omega-3 Fatty Acids (FISH OIL) 500 MG CAPS Take 500 mg by mouth at bedtime.     [provider]  potassium chloride (K-DUR) 10 MEQ tablet TAKE BY MOUTH DAILY WITH FLUID PILL 03/18/18   [provider]  sacubitril-valsartan (ENTRESTO) 49-51 MG Take 1 tablet by mouth 2 (two) times daily. 06/18/19   Verta Ellen., NP  Saw Palmetto 500 MG CAPS Take 1 capsule by mouth 2 (two) times daily.    [provider]  simvastatin (ZOCOR) 40 MG tablet Take 40 mg by mouth daily.    [provider]  spironolactone (ALDACTONE) 25 MG tablet Take 1 tablet (25 mg total) by mouth daily. 03/20/19 06/18/19  Satira Sark, MD  tamsulosin (FLOMAX) 0.4 MG CAPS capsule Take 0.4 mg by  mouth daily. 12/02/18   [provider]  vitamin C (ASCORBIC ACID) 500 MG tablet Take 500 mg by mouth 2 (two) times daily.     [provider]  warfarin (COUMADIN) 5 MG tablet Take 1.5 tablets (7.5 mg total) by mouth daily at 6 PM. OR AS DIRECTED by MD. Patient taking differently: Take 7.5 mg by mouth daily at 6 PM. MANAGED MY PMD 09/07/15   Modena Jansky, MD    Allergies    Patient has no known allergies.  Review of Systems   Review of Systems All systems reviewed and negative, other than as noted in HPI.  Physical Exam Updated Vital Signs BP (!) 110/53   Pulse (!) 42   Temp 97.7 F (36.5 C) (Oral)   Resp 20   Ht 5\' 8"  (1.727 m)   Wt 113.4 kg   SpO2 95%   BMI 38.01 kg/m   Physical Exam Vitals and nursing note reviewed.  Constitutional:      General: He is not in acute distress.    Appearance: He is well-developed.  HENT:     Head: Normocephalic and atraumatic.  Eyes:     General:        Right eye: No discharge.        Left eye: No discharge.     Conjunctiva/sclera: Conjunctivae normal.  Cardiovascular:     Rate and Rhythm: Regular rhythm. Tachycardia present.     Heart sounds: Normal heart sounds. No murmur. No friction rub. No gallop.      Comments: Mild tachycardia.  Mostly regular with occasional ectopy. Pulmonary:     Effort:  Pulmonary effort is normal. No respiratory distress.     Breath sounds: Normal breath sounds.  Abdominal:     General: There is no distension.     Palpations: Abdomen is soft.     Tenderness: There is no abdominal tenderness.  Musculoskeletal:        General: No tenderness.     Cervical back: Neck supple.  Skin:    General: Skin is warm and dry.  Neurological:     Mental Status: He is alert.  Psychiatric:        Behavior: Behavior normal.        Thought Content: Thought content normal.     ED Results / Procedures / Treatments   Labs (all labs ordered are listed, but only abnormal results are displayed) Labs Reviewed  CBC WITH DIFFERENTIAL/PLATELET - Abnormal; Notable for the following components:      Result Value   RBC 3.97 (*)    Hemoglobin 12.1 (*)    HCT 37.8 (*)    Platelets 135 (*)    Lymphs Abs 0.6 (*)    All other components within normal limits  BASIC METABOLIC PANEL - Abnormal; Notable for the following components:   Glucose, Bld 119 (*)    BUN 42 (*)    Creatinine, Ser 1.40 (*)    GFR calc non Af Amer 46 (*)    GFR calc Af Amer 54 (*)    All other components within normal limits  URINALYSIS, ROUTINE W REFLEX MICROSCOPIC - Abnormal; Notable for the following components:   APPearance CLOUDY (*)    Hgb urine dipstick SMALL (*)    Leukocytes,Ua LARGE (*)    WBC, UA >50 (*)    All other components within normal limits  BRAIN NATRIURETIC PEPTIDE - Abnormal; Notable for the following components:   B Natriuretic Peptide 630.0 (*)  All other components within normal limits  BASIC METABOLIC PANEL - Abnormal; Notable for the following components:   Potassium 5.2 (*)    Glucose, Bld 138 (*)    BUN 38 (*)    All other components within normal limits  CBC - Abnormal; Notable for the following components:   RBC 3.97 (*)    Hemoglobin 11.8 (*)    HCT 38.3 (*)    All other components within normal limits  PROTIME-INR - Abnormal; Notable for the following  components:   Prothrombin Time 16.1 (*)    INR 1.3 (*)    All other components within normal limits  BLOOD GAS, ARTERIAL - Abnormal; Notable for the following components:   pH, Arterial 7.174 (*)    pCO2 arterial 79.2 (*)    pO2, Arterial 146 (*)    All other components within normal limits  GLUCOSE, CAPILLARY - Abnormal; Notable for the following components:   Glucose-Capillary 276 (*)    All other components within normal limits  BLOOD GAS, ARTERIAL - Abnormal; Notable for the following components:   pH, Arterial 7.321 (*)    pCO2 arterial 57.1 (*)    Acid-Base Excess 3.1 (*)    All other components within normal limits  PROTIME-INR - Abnormal; Notable for the following components:   Prothrombin Time 15.6 (*)    INR 1.3 (*)    All other components within normal limits  BASIC METABOLIC PANEL - Abnormal; Notable for the following components:   BUN 44 (*)    Creatinine, Ser 1.25 (*)    GFR calc non Af Amer 53 (*)    All other components within normal limits  TROPONIN I (HIGH SENSITIVITY) - Abnormal; Notable for the following components:   Troponin I (High Sensitivity) 21 (*)    All other components within normal limits  TROPONIN I (HIGH SENSITIVITY) - Abnormal; Notable for the following components:   Troponin I (High Sensitivity) 32 (*)    All other components within normal limits  SARS CORONAVIRUS 2 (TAT 6-24 HRS)  RESPIRATORY PANEL BY RT PCR (FLU A&B, COVID)  MRSA PCR SCREENING  MAGNESIUM  PROTIME-INR  MAGNESIUM  PROTIME-INR  TROPONIN I (HIGH SENSITIVITY)  TROPONIN I (HIGH SENSITIVITY)    EKG EKG Interpretation  Date/Time:  Tuesday June 30 2019 18:21:34 EDT Ventricular Rate:  96 PR Interval:    QRS Duration: 107 QT Interval:  352 QTC Calculation: 445 R Axis:   70 Text Interpretation: Sinus tachycardia Multiple premature complexes, vent & supraven Anterior infarct, old Confirmed by Virgel Manifold [phone]) on 06/30/2019 6:30:27 PM   Radiology DG Chest Port 1  View  Result Date: 06/30/2019 CLINICAL DATA:  Dyspnea EXAM: PORTABLE CHEST 1 VIEW COMPARISON:  09/01/2015 FINDINGS: Post sternotomy changes. Probable trace right-sided pleural effusion. Small left loculated pleural effusion or pleural thickening. Irregular peripheral opacity in the left mid lung contiguous with the pleural surface. Consolidation at the left base. Stable cardiomediastinal silhouette. No pneumothorax. IMPRESSION: 1. Left loculated pleural effusion or pleural thickening with irregular opacity in the left mid lung extending to the pleural surface, suggest chest CT for further evaluation. There is also airspace disease at the left base 2. Probable trace right pleural effusion Electronically Signed   By: Donavan Foil M.D.   On: 06/30/2019 20:20    Procedures Procedures (including critical care time)  Medications Ordered in ED Medications - No data to display  ED Course  I have reviewed the triage vital signs and the nursing notes.  Pertinent labs & imaging results that were available during my care of the patient were reviewed by me and considered in my medical decision making (see chart for details).    MDM Rules/Calculators/A&P                       Final Clinical Impression(s) / ED Diagnoses Final diagnoses:  SOB (shortness of breath)  AKI (acute kidney injury) [facility])    Rx / DC Orders ED Discharge Orders    None       Virgel Manifold, MD 07/02/19 1711

## 2019-06-30 NOTE — ED Triage Notes (Signed)
Pt present to ED via [facility] for generalized weakness "over whole body" about an hour ago. CBG 150 by EMS.

## 2019-07-01 ENCOUNTER — Inpatient Hospital Stay (HOSPITAL_COMMUNITY): Payer: Medicare HMO

## 2019-07-01 ENCOUNTER — Encounter (HOSPITAL_COMMUNITY): Payer: Self-pay | Admitting: Internal Medicine

## 2019-07-01 DIAGNOSIS — I513 Intracardiac thrombosis, not elsewhere classified: Secondary | ICD-10-CM | POA: Diagnosis not present

## 2019-07-01 DIAGNOSIS — Z96652 Presence of left artificial knee joint: Secondary | ICD-10-CM | POA: Diagnosis present

## 2019-07-01 DIAGNOSIS — R531 Weakness: Secondary | ICD-10-CM | POA: Diagnosis present

## 2019-07-01 DIAGNOSIS — Z951 Presence of aortocoronary bypass graft: Secondary | ICD-10-CM | POA: Diagnosis not present

## 2019-07-01 DIAGNOSIS — I130 Hypertensive heart and chronic kidney disease with heart failure and stage 1 through stage 4 chronic kidney disease, or unspecified chronic kidney disease: Secondary | ICD-10-CM | POA: Diagnosis not present

## 2019-07-01 DIAGNOSIS — I509 Heart failure, unspecified: Secondary | ICD-10-CM | POA: Diagnosis not present

## 2019-07-01 DIAGNOSIS — J9601 Acute respiratory failure with hypoxia: Secondary | ICD-10-CM | POA: Diagnosis present

## 2019-07-01 DIAGNOSIS — N179 Acute kidney failure, unspecified: Secondary | ICD-10-CM

## 2019-07-01 DIAGNOSIS — N400 Enlarged prostate without lower urinary tract symptoms: Secondary | ICD-10-CM | POA: Diagnosis present

## 2019-07-01 DIAGNOSIS — I25708 Atherosclerosis of coronary artery bypass graft(s), unspecified, with other forms of angina pectoris: Secondary | ICD-10-CM | POA: Diagnosis not present

## 2019-07-01 DIAGNOSIS — I480 Paroxysmal atrial fibrillation: Secondary | ICD-10-CM | POA: Diagnosis not present

## 2019-07-01 DIAGNOSIS — I248 Other forms of acute ischemic heart disease: Secondary | ICD-10-CM | POA: Diagnosis not present

## 2019-07-01 DIAGNOSIS — I255 Ischemic cardiomyopathy: Secondary | ICD-10-CM | POA: Diagnosis not present

## 2019-07-01 DIAGNOSIS — E785 Hyperlipidemia, unspecified: Secondary | ICD-10-CM

## 2019-07-01 DIAGNOSIS — Z20822 Contact with and (suspected) exposure to covid-19: Secondary | ICD-10-CM | POA: Diagnosis not present

## 2019-07-01 DIAGNOSIS — I5043 Acute on chronic combined systolic (congestive) and diastolic (congestive) heart failure: Secondary | ICD-10-CM

## 2019-07-01 DIAGNOSIS — E782 Mixed hyperlipidemia: Secondary | ICD-10-CM | POA: Diagnosis present

## 2019-07-01 DIAGNOSIS — I10 Essential (primary) hypertension: Secondary | ICD-10-CM | POA: Diagnosis not present

## 2019-07-01 DIAGNOSIS — I252 Old myocardial infarction: Secondary | ICD-10-CM | POA: Diagnosis not present

## 2019-07-01 DIAGNOSIS — I5023 Acute on chronic systolic (congestive) heart failure: Secondary | ICD-10-CM | POA: Diagnosis not present

## 2019-07-01 DIAGNOSIS — I739 Peripheral vascular disease, unspecified: Secondary | ICD-10-CM | POA: Diagnosis present

## 2019-07-01 DIAGNOSIS — Z7901 Long term (current) use of anticoagulants: Secondary | ICD-10-CM

## 2019-07-01 DIAGNOSIS — I2510 Atherosclerotic heart disease of native coronary artery without angina pectoris: Secondary | ICD-10-CM | POA: Diagnosis not present

## 2019-07-01 DIAGNOSIS — Z85828 Personal history of other malignant neoplasm of skin: Secondary | ICD-10-CM | POA: Diagnosis not present

## 2019-07-01 DIAGNOSIS — Z8673 Personal history of transient ischemic attack (TIA), and cerebral infarction without residual deficits: Secondary | ICD-10-CM

## 2019-07-01 DIAGNOSIS — Z8249 Family history of ischemic heart disease and other diseases of the circulatory system: Secondary | ICD-10-CM | POA: Diagnosis not present

## 2019-07-01 DIAGNOSIS — Z79899 Other long term (current) drug therapy: Secondary | ICD-10-CM | POA: Diagnosis not present

## 2019-07-01 DIAGNOSIS — J9602 Acute respiratory failure with hypercapnia: Secondary | ICD-10-CM | POA: Diagnosis not present

## 2019-07-01 DIAGNOSIS — Z87891 Personal history of nicotine dependence: Secondary | ICD-10-CM | POA: Diagnosis not present

## 2019-07-01 DIAGNOSIS — R0602 Shortness of breath: Secondary | ICD-10-CM

## 2019-07-01 DIAGNOSIS — N1831 Chronic kidney disease, stage 3a: Secondary | ICD-10-CM | POA: Diagnosis not present

## 2019-07-01 DIAGNOSIS — D649 Anemia, unspecified: Secondary | ICD-10-CM | POA: Diagnosis present

## 2019-07-01 LAB — BASIC METABOLIC PANEL
Anion gap: 8 (ref 5–15)
BUN: 38 mg/dL — ABNORMAL HIGH (ref 8–23)
CO2: 27 mmol/L (ref 22–32)
Calcium: 8.9 mg/dL (ref 8.9–10.3)
Chloride: 103 mmol/L (ref 98–111)
Creatinine, Ser: 1.1 mg/dL (ref 0.61–1.24)
GFR calc Af Amer: >60 mL/min (ref >60)
GFR calc non Af Amer: >60 mL/min (ref >60)
Glucose, Bld: 138 mg/dL — ABNORMAL HIGH (ref 70–99)
Potassium: 5.2 mmol/L — ABNORMAL HIGH (ref 3.5–5.1)
Sodium: 138 mmol/L (ref 135–145)

## 2019-07-01 LAB — CBC
HCT: 38.3 % — ABNORMAL LOW (ref 39.0–52.0)
Hemoglobin: 11.8 g/dL — ABNORMAL LOW (ref 13.0–17.0)
MCH: 29.7 pg (ref 26.0–34.0)
MCHC: 30.8 g/dL (ref 30.0–36.0)
MCV: 96.5 fL (ref 80.0–100.0)
Platelets: 154 10*3/uL (ref 150–400)
RBC: 3.97 MIL/uL — ABNORMAL LOW (ref 4.22–5.81)
RDW: 13.7 % (ref 11.5–15.5)
WBC: 9.6 10*3/uL (ref 4.0–10.5)
nRBC: 0 % (ref 0.0–0.2)

## 2019-07-01 LAB — SARS CORONAVIRUS 2 (TAT 6-24 HRS): SARS Coronavirus 2: NEGATIVE

## 2019-07-01 LAB — URINALYSIS, ROUTINE W REFLEX MICROSCOPIC
Bacteria, UA: NONE SEEN
Bilirubin Urine: NEGATIVE
Glucose, UA: NEGATIVE mg/dL
Ketones, ur: NEGATIVE mg/dL
Nitrite: NEGATIVE
Protein, ur: NEGATIVE mg/dL
Specific Gravity, Urine: 1.027 (ref 1.005–1.030)
WBC, UA: >50 WBC/hpf — ABNORMAL HIGH (ref 0–5)
pH: 5 (ref 5.0–8.0)

## 2019-07-01 LAB — ECHOCARDIOGRAM LIMITED
Height: 68 in
Weight: 3925.95 oz

## 2019-07-01 LAB — TROPONIN I (HIGH SENSITIVITY): Troponin I (High Sensitivity): 21 ng/L — ABNORMAL HIGH (ref <18)

## 2019-07-01 LAB — BLOOD GAS, ARTERIAL
Acid-Base Excess: 0.3 mmol/L (ref 0.0–2.0)
Acid-Base Excess: 3.1 mmol/L — ABNORMAL HIGH (ref 0.0–2.0)
Bicarbonate: 22.3 mmol/L (ref 20.0–28.0)
Bicarbonate: 26.1 mmol/L (ref 20.0–28.0)
FIO2: 100
FIO2: 40
O2 Saturation: 97.9 %
O2 Saturation: 97.7 %
Patient temperature: 37
Patient temperature: 37
pCO2 arterial: 79.2 mmHg (ref 32.0–48.0)
pCO2 arterial: 57.1 mmHg — ABNORMAL HIGH (ref 32.0–48.0)
pH, Arterial: 7.174 — CL (ref 7.350–7.450)
pH, Arterial: 7.321 — ABNORMAL LOW (ref 7.350–7.450)
pO2, Arterial: 146 mmHg — ABNORMAL HIGH (ref 83.0–108.0)
pO2, Arterial: 108 mmHg (ref 83.0–108.0)

## 2019-07-01 LAB — MRSA PCR SCREENING: MRSA by PCR: NEGATIVE

## 2019-07-01 LAB — PROTIME-INR
INR: 1.3 — ABNORMAL HIGH (ref 0.8–1.2)
Prothrombin Time: 16.1 seconds — ABNORMAL HIGH (ref 11.4–15.2)

## 2019-07-01 LAB — GLUCOSE, CAPILLARY: Glucose-Capillary: 276 mg/dL — ABNORMAL HIGH (ref 70–99)

## 2019-07-01 LAB — RESPIRATORY PANEL BY RT PCR (FLU A&B, COVID)
Influenza A by PCR: NEGATIVE
Influenza B by PCR: NEGATIVE
SARS Coronavirus 2 by RT PCR: NEGATIVE

## 2019-07-01 MED ORDER — CARVEDILOL 12.5 MG PO TABS
12.5000 mg | ORAL_TABLET | Freq: Two times a day (BID) | ORAL | Status: DC
  Filled 2019-07-01 (×3): qty 1

## 2019-07-01 MED ORDER — FUROSEMIDE 10 MG/ML IJ SOLN
20.0000 mg | Freq: Two times a day (BID) | INTRAMUSCULAR | Status: DC
  Administered 2019-07-01: 20 mg via INTRAVENOUS
  Filled 2019-07-01: qty 2

## 2019-07-01 MED ORDER — SODIUM CHLORIDE 0.9% FLUSH: 3.0000 mL | INTRAVENOUS | Status: DC | PRN

## 2019-07-01 MED ORDER — BISACODYL 10 MG RE SUPP: 10.0000 mg | Freq: Every day | RECTAL | Status: DC | PRN

## 2019-07-01 MED ORDER — FUROSEMIDE 10 MG/ML IJ SOLN: 30.0000 mg | Freq: Two times a day (BID) | INTRAMUSCULAR | Status: DC

## 2019-07-01 MED ORDER — POTASSIUM CHLORIDE ER 10 MEQ PO TBCR
10.0000 meq | EXTENDED_RELEASE_TABLET | Freq: Every day | ORAL | Status: DC
  Filled 2019-07-01 (×3): qty 1

## 2019-07-01 MED ORDER — ONDANSETRON HCL 4 MG/2ML IJ SOLN: 4.0000 mg | Freq: Four times a day (QID) | INTRAMUSCULAR | Status: DC | PRN

## 2019-07-01 MED ORDER — SODIUM CHLORIDE 0.9 % IV SOLN: 250.0000 mL | INTRAVENOUS | Status: DC | PRN

## 2019-07-01 MED ORDER — SACUBITRIL-VALSARTAN 24-26 MG PO TABS: 1.0000 | ORAL_TABLET | Freq: Two times a day (BID) | ORAL | Status: DC

## 2019-07-01 MED ORDER — POLYETHYLENE GLYCOL 3350 17 G PO PACK: 17.0000 g | PACK | Freq: Every day | ORAL | Status: DC | PRN

## 2019-07-01 MED ORDER — FUROSEMIDE 10 MG/ML IJ SOLN
40.0000 mg | Freq: Two times a day (BID) | INTRAMUSCULAR | Status: DC
  Administered 2019-07-01 – 2019-07-02 (×3): 40 mg via INTRAVENOUS
  Filled 2019-07-01 (×2): qty 4

## 2019-07-01 MED ORDER — SPIRONOLACTONE 12.5 MG HALF TABLET
12.5000 mg | ORAL_TABLET | Freq: Every day | ORAL | Status: DC
  Filled 2019-07-01 (×3): qty 1

## 2019-07-01 MED ORDER — SACUBITRIL-VALSARTAN 24-26 MG PO TABS
1.0000 | ORAL_TABLET | Freq: Two times a day (BID) | ORAL | Status: DC
  Administered 2019-07-02 – 2019-07-04 (×4): 1 via ORAL
  Filled 2019-07-01 (×6): qty 1

## 2019-07-01 MED ORDER — SUPER B-COMPLEX/VIT C/FA PO TABS: 1.0000 | ORAL_TABLET | Freq: Every day | ORAL | Status: DC

## 2019-07-01 MED ORDER — OMEGA-3-ACID ETHYL ESTERS 1 G PO CAPS
1.0000 g | ORAL_CAPSULE | Freq: Every day | ORAL | Status: DC
  Administered 2019-07-02 – 2019-07-04 (×3): 1 g via ORAL
  Filled 2019-07-01 (×5): qty 1

## 2019-07-01 MED ORDER — LORAZEPAM 2 MG/ML IJ SOLN
0.5000 mg | Freq: Three times a day (TID) | INTRAMUSCULAR | Status: DC | PRN
  Administered 2019-07-01: 0.5 mg via INTRAVENOUS

## 2019-07-01 MED ORDER — TAMSULOSIN HCL 0.4 MG PO CAPS
0.4000 mg | ORAL_CAPSULE | Freq: Every day | ORAL | Status: DC
  Administered 2019-07-02 – 2019-07-04 (×3): 0.4 mg via ORAL
  Filled 2019-07-01 (×4): qty 1

## 2019-07-01 MED ORDER — ACETAMINOPHEN 500 MG PO TABS: 1000.0000 mg | ORAL_TABLET | Freq: Every evening | ORAL | Status: DC | PRN

## 2019-07-01 MED ORDER — CHLORHEXIDINE GLUCONATE CLOTH 2 % EX PADS
6.0000 | MEDICATED_PAD | Freq: Every day | CUTANEOUS | Status: DC
  Administered 2019-07-01 – 2019-07-03 (×3): 6 via TOPICAL

## 2019-07-01 MED ORDER — FUROSEMIDE 20 MG PO TABS: 20.0000 mg | ORAL_TABLET | Freq: Every day | ORAL | Status: DC

## 2019-07-01 MED ORDER — PERFLUTREN LIPID MICROSPHERE
1.0000 mL | INTRAVENOUS | Status: AC | PRN
  Administered 2019-07-01: 3 mL via INTRAVENOUS
  Filled 2019-07-01: qty 10

## 2019-07-01 MED ORDER — SODIUM CHLORIDE 0.9% FLUSH
3.0000 mL | Freq: Two times a day (BID) | INTRAVENOUS | Status: DC
  Administered 2019-07-01 – 2019-07-04 (×8): 3 mL via INTRAVENOUS

## 2019-07-01 MED ORDER — SIMVASTATIN 20 MG PO TABS
40.0000 mg | ORAL_TABLET | Freq: Every day | ORAL | Status: DC
  Administered 2019-07-02 – 2019-07-03 (×2): 40 mg via ORAL
  Filled 2019-07-01 (×2): qty 2

## 2019-07-01 MED ORDER — ACETAMINOPHEN 325 MG PO TABS: 650.0000 mg | ORAL_TABLET | Freq: Four times a day (QID) | ORAL | Status: DC | PRN

## 2019-07-01 MED ORDER — METOPROLOL TARTRATE 5 MG/5ML IV SOLN
2.5000 mg | Freq: Four times a day (QID) | INTRAVENOUS | Status: DC | PRN
  Administered 2019-07-01: 03:00:00 2.5 mg via INTRAVENOUS
  Filled 2019-07-01: qty 5

## 2019-07-01 MED ORDER — SAW PALMETTO 500 MG PO CAPS: 1.0000 | ORAL_CAPSULE | Freq: Two times a day (BID) | ORAL | Status: DC

## 2019-07-01 MED ORDER — IPRATROPIUM-ALBUTEROL 0.5-2.5 (3) MG/3ML IN SOLN
3.0000 mL | Freq: Four times a day (QID) | RESPIRATORY_TRACT | Status: DC | PRN
  Administered 2019-07-02 – 2019-07-03 (×2): 3 mL via RESPIRATORY_TRACT
  Filled 2019-07-01 (×2): qty 3

## 2019-07-01 MED ORDER — WARFARIN - PHYSICIAN DOSING INPATIENT: Freq: Every day | Status: DC

## 2019-07-01 MED ORDER — ACETAMINOPHEN 650 MG RE SUPP
650.0000 mg | Freq: Four times a day (QID) | RECTAL | Status: DC | PRN
  Administered 2019-07-01: 650 mg via RECTAL
  Filled 2019-07-01: qty 1

## 2019-07-01 MED ORDER — BUDESONIDE 0.5 MG/2ML IN SUSP
0.5000 mg | Freq: Two times a day (BID) | RESPIRATORY_TRACT | Status: DC
  Administered 2019-07-01 – 2019-07-04 (×6): 0.5 mg via RESPIRATORY_TRACT
  Filled 2019-07-01 (×6): qty 2

## 2019-07-01 MED ORDER — FINASTERIDE 5 MG PO TABS
5.0000 mg | ORAL_TABLET | Freq: Every day | ORAL | Status: DC
  Administered 2019-07-02 – 2019-07-04 (×3): 5 mg via ORAL
  Filled 2019-07-01 (×5): qty 1

## 2019-07-01 MED ORDER — FUROSEMIDE 10 MG/ML IJ SOLN
INTRAMUSCULAR | Status: AC
  Filled 2019-07-01: qty 4

## 2019-07-01 MED ORDER — TRAMADOL HCL 50 MG PO TABS: 50.0000 mg | ORAL_TABLET | Freq: Three times a day (TID) | ORAL | Status: DC | PRN

## 2019-07-01 MED ORDER — ADULT MULTIVITAMIN W/MINERALS CH
1.0000 | ORAL_TABLET | Freq: Every day | ORAL | Status: DC
  Administered 2019-07-02 – 2019-07-04 (×3): 1 via ORAL
  Filled 2019-07-01 (×4): qty 1

## 2019-07-01 MED ORDER — LORAZEPAM 2 MG/ML IJ SOLN
INTRAMUSCULAR | Status: AC
  Filled 2019-07-01: qty 1

## 2019-07-01 MED ORDER — ORAL CARE MOUTH RINSE
15.0000 mL | Freq: Two times a day (BID) | OROMUCOSAL | Status: DC
  Administered 2019-07-01 – 2019-07-04 (×6): 15 mL via OROMUCOSAL

## 2019-07-01 MED ORDER — FOLIC ACID 1 MG PO TABS
500.0000 ug | ORAL_TABLET | Freq: Every day | ORAL | Status: DC
  Administered 2019-07-02 – 2019-07-04 (×3): 0.5 mg via ORAL
  Filled 2019-07-01 (×4): qty 1

## 2019-07-01 MED ORDER — DIPHENHYDRAMINE-APAP (SLEEP) 25-500 MG PO TABS: 2.0000 | ORAL_TABLET | Freq: Every evening | ORAL | Status: DC | PRN

## 2019-07-01 MED ORDER — DIPHENHYDRAMINE HCL 25 MG PO CAPS: 50.0000 mg | ORAL_CAPSULE | Freq: Every evening | ORAL | Status: DC | PRN

## 2019-07-01 MED ORDER — PNEUMOCOCCAL VAC POLYVALENT 25 MCG/0.5ML IJ INJ
0.5000 mL | INJECTION | INTRAMUSCULAR | Status: DC
  Filled 2019-07-01: qty 0.5

## 2019-07-01 MED ORDER — WARFARIN SODIUM 7.5 MG PO TABS: 7.5000 mg | ORAL_TABLET | Freq: Every day | ORAL | Status: DC

## 2019-07-01 MED ORDER — ONDANSETRON HCL 4 MG PO TABS: 4.0000 mg | ORAL_TABLET | Freq: Four times a day (QID) | ORAL | Status: DC | PRN

## 2019-07-01 MED ORDER — ASCORBIC ACID 500 MG PO TABS
500.0000 mg | ORAL_TABLET | Freq: Two times a day (BID) | ORAL | Status: DC
  Administered 2019-07-01 – 2019-07-04 (×6): 500 mg via ORAL
  Filled 2019-07-01 (×7): qty 1

## 2019-07-01 NOTE — Progress Notes (Signed)
Upon admission to unit 300, patient transferred from stretcher to bed. O2 sat 79% on room air. Wheezing noted. Patient placed on 2L [place]. Telemetry placed on patient. Patient heart rate sustaining from 115-120's, afib. MEWS Score 3, yellow. MD notified. New orders placed and followed.

## 2019-07-01 NOTE — Progress Notes (Signed)
Orthostatic vitals lying and sitting obtained. Patient unable to tolerate standing position at this time.

## 2019-07-01 NOTE — H&P (Signed)
History and Physical    Patient Demographics:    Benjamin Cordova [id number] DOB: 1935-02-20 DOA: 06/30/2019  PCP: Celene Squibb, MD  Patient coming from: Home  I have personally briefly reviewed patient's old medical records in [place]  Chief Complaint: Weakness, shortness of breath   Assessment & Plan:     Assessment/Plan Principal Problem:   Shortness of breath Active Problems:   Coronary artery disease   Hypertension   Peripheral vascular disease (HCC)   Paroxysmal atrial fibrillation (HCC)   S/P CABG x 2   Subtherapeutic international normalized ratio (INR)   CHF (congestive heart failure) ([place])   Weakness     Principal Problem: Acute on chronic physical deconditioning with chronic debility Patient reports increased weakness, dyspnea on exertion with minimal ambulation.  Has a history of polio in the right lower extremity.  Uses a walker at baseline.  Appears to be having worsening physical deconditioning with underlying history of ischemic cardiomyopathy following an increase in Entresto dosing.  Is noted to have slightly worsened renal function. CT of the chest shows small loculated left pleural effusion with pleural thickening, likely chronic with some atelectasis in the left upper and lower lobes.  No evidence of hypoxemia on presentation.  Does have some tachycardia with multiple PVCs.  Patient likely has decreased physical reserve with worsening weakness likely precipitated by worsening heart failure and tachyarrhythmia. -We will get PT/OT evaluation  Other Active Problems: Ischemic cardiomyopathy with mild acute on chronic congestive heart failure Patient has history of congestive heart failure with reduced ejection fraction.  Most recent echocardiogram in December 2020 showed an EF of 30 to 35%.  Follows with the cardiology as outpatient.  Does seem to have pretty persistent dyspnea on exertion which is acutely worsening. CT chest shows possible mild  edema with small loculated pleural effusion.  Has elevated BNP but negative troponins.  Does have some tachyarrhythmia with multiple PVCs. -We will decrease Entresto dose to 24/26 as patient is having worsening renal function -reduce aldactone to 12.5 mg for now as potassium is 5.0 -We will place on IV Lasix -monitor input / output, renal function and daily weights  Acute renal failure Creatinine is increased to 1.4 on presentation.  Most recent creatinine was 0.9 in November 2020. Patient is on Entresto with a recent dose increase. -Monitor input output, renal function closely  Coronary artery disease s/p CABG History of multivessel CABG with subsequent redo in 2016.  Paroxysmal atrial fibrillation with history of apical thrombus Patient is on Coumadin.  INR is currently subtherapeutic at 1.2. EKG shows sinus tachycardia with multiple PVCs. -Continue Coumadin and monitor INR -Continue carvedilol  Hypertension -Continue carvedilol with parameters  Hyperlipidemia -Continue simvastatin, omega-3 fatty acids  History of CVA -No residual deficits from CVA. Does report a history of polio with mild weakness in the right LE   Chronic normocytic anemia Hemoglobin 12.1.  Appears to be stable.  No evidence of bleeding. -Monitor CBC  BPH -Continue tamsulosin, finasteride  DVT prophylaxis: Warfarin Code Status:  Full code Family Communication: N/A  Disposition Plan: Place in observation for evaluation of dyspnea, weakness Consults called: N/A Admission status: Observation stay    HPI:     HPI: Benjamin Cordova is a 85 y.o. male with medical history significant of ischemic cardiomyopathy, coronary artery disease, hypertension, hyperlipidemia, peripheral vascular disease, history of TIA who presented to the ER with increased weakness.  Patient states he has been having increasing weakness at home  and today was significantly worse and had difficulty with ambulation and shortness of  breath with minimal activity.  Has a history of ischemic cardiomyopathy with EF of 30 to 35% and is on Entresto at home.  Did see the cardiologist as outpatient on 06/18/2019 and was his Benjamin Cordova was increased at that time.  He has a history of polio in the right lower extremity and in general has been very sedentary at home over the last several weeks.  Does use a walker for ambulation. Has not had any chest pain.  No increased lower extremity edema.  No fever, chills, cough, palpitations.  No nausea, vomiting, abdominal pain, diarrhea, dysuria.  No seizures, syncope, dizziness, lightheadedness. ED Course:  Vital Signs reviewed on presentation, significant for Temperature 97.7, heart rate 108, blood pressure 116/59, saturation 93% on room air. Labs reviewed, significant for sodium 137, potassium 5.0, BUN 42, creatinine 1.4, BNP 630, troponin negative x2, WBC count 6.6, hemoglobin 12.1, hematocrit 37, platelets 135, INR 1.2. Imaging personally Reviewed, CT chest with IV contrast shows small loculated left pleural effusion with pleural thickening, likely chronic.  Rounded atelectasis in the left upper and lower lobes, faint groundglass opacities in the right lung suggesting mild edema. EKG personally reviewed, shows sinus tachycardia, PVCs as well as PACs.    Review of systems:    Review of Systems: As per HPI otherwise 10 point review of systems negative.  All other review of systems is negative except the ones noted above in the HPI.    Past Medical and Surgical History:  Reviewed by me  Past Medical History:  Diagnosis Date  . Arthritis   . Coronary artery disease    Multivessel s/p CABG 1981 at [facility]  . Essential hypertension   . Hematuria   . History of blood transfusion 07/2015   Knee replacement HgB <9  . History of pneumonia   . History of stroke   . Mixed hyperlipidemia   . Myocardial infarction ([place]) 1980  . Peripheral vascular disease ([place])   . Polio osteopathy of lower leg  ([place]) Age 51   Affected right leg  . Skin cancer   . TIA (transient ischemic attack) 1989   Chronic coumadin  . UTI (lower urinary tract infection)     Past Surgical History:  Procedure Laterality Date  . CARDIAC CATHETERIZATION N/A 10/05/2014   Procedure: Left Heart Cath and Coronary Angiography;  Surgeon: Peter M Martinique, MD;  Location: [facility];  Service: Cardiovascular;  Laterality: N/A;  . CORONARY ARTERY BYPASS GRAFT  1981   [facility]  . CORONARY ARTERY BYPASS GRAFT N/A 10/08/2014   Procedure: REDO CORONARY ARTERY BYPASS GRAFTING (CABG) Times Two Grafts using Left Internal Mammary and Left Radial Artery;  Surgeon: Melrose Nakayama, MD;  Location: [facility];  Service: Open Heart Surgery;  Laterality: N/A;  . CYSTOSCOPY WITH INJECTION  10/10/2010   Procedure: CYSTOSCOPY WITH INJECTION;  Surgeon: Marissa Nestle;  Location: [facility];  Service: Urology;  Laterality: N/A;  with retrograde urethrogram  . FOOT SURGERY Right 1954   [facility], muscle implantation  . JOINT REPLACEMENT    . RADIAL ARTERY HARVEST Left 10/08/2014   Procedure: Left RADIAL ARTERY HARVEST;  Surgeon: Melrose Nakayama, MD;  Location: [facility];  Service: Open Heart Surgery;  Laterality: Left;  . TEE WITHOUT CARDIOVERSION N/A 10/08/2014   Procedure: TRANSESOPHAGEAL ECHOCARDIOGRAM (TEE);  Surgeon: Melrose Nakayama, MD;  Location: [facility];  Service: Open Heart Surgery;  Laterality:  N/A;  . TONSILLECTOMY  1949   [facility]  . TOTAL KNEE ARTHROPLASTY Left 07/27/2015   Procedure: TOTAL LEFT KNEE ARTHROPLASTY;  Surgeon: [name], MD;  Location: [facility];  Service: Orthopedics;  Laterality: Left;     Social History:  Reviewed by me   reports that he quit smoking about 40 years ago. His smoking use included cigarettes. He has a 28.00 pack-year smoking history. He has never used smokeless tobacco. He reports that he does not drink alcohol or use drugs.  Allergies:    No Known Allergies  Family History :   Family  History  Problem Relation Age of Onset  . Heart attack Mother   . Diabetes Mother   . Heart attack Father   . Heart attack Brother   . Anesthesia problems Neg Hx   . Hypotension Neg Hx   . Malignant hyperthermia Neg Hx   . Pseudochol deficiency Neg Hx    Family history reviewed, noted as above, not pertinent to current presentation.   Home Medications:    Prior to Admission medications   Medication Sig Start Date End Date Taking? Authorizing Provider  B Complex-C-Folic Acid (SUPER B-COMPLEX/VIT C/FA) TABS Take 1 tablet by mouth daily.      [provider]  carvedilol (COREG) 12.5 MG tablet TAKE 1 TABLET TWICE DAILY  (STOP  ATENOLOL) Patient taking differently: Take 12.5 mg by mouth 2 (two) times daily with a meal.  10/08/16   Satira Sark, MD  diphenhydramine-acetaminophen (TYLENOL PM) 25-500 MG TABS tablet Take 2 tablets by mouth at bedtime as needed.    [provider]  finasteride (PROSCAR) 5 MG tablet Take 5 mg by mouth daily. 04/20/19   [provider]  folic acid (FOLVITE) [id number] MCG tablet Take 400 mcg by mouth daily.     [provider]  furosemide (LASIX) 20 MG tablet Take 20 mg by mouth daily.  03/18/18   [provider]  Multiple Vitamins-Minerals (CERTAVITE/ANTIOXIDANTS) TABS Take 1 tablet by mouth daily.     [provider]  Omega-3 Fatty Acids (FISH OIL) 500 MG CAPS Take 500 mg by mouth at bedtime.    [provider]  potassium chloride (K-DUR) 10 MEQ tablet Take 10 mEq by mouth daily. Take with Lasix (Furosemide) 03/18/18   [provider]  sacubitril-valsartan (ENTRESTO) 49-51 MG Take 1 tablet by mouth 2 (two) times daily. 06/18/19   Verta Ellen., NP  Saw Palmetto 500 MG CAPS Take 1 capsule by mouth 2 (two) times daily.    [provider]  simvastatin (ZOCOR) 40 MG tablet Take 40 mg by mouth daily.    [provider]  spironolactone (ALDACTONE) 25 MG tablet Take 1 tablet (25  mg total) by mouth daily. 03/20/19 06/30/19  Satira Sark, MD  tamsulosin (FLOMAX) 0.4 MG CAPS capsule Take 0.4 mg by mouth daily. 12/02/18   [provider]  vitamin C (ASCORBIC ACID) 500 MG tablet Take 500 mg by mouth 2 (two) times daily.     [provider]  warfarin (COUMADIN) 5 MG tablet Take 1.5 tablets (7.5 mg total) by mouth daily at 6 PM. OR AS DIRECTED by MD. Patient taking differently: Take 7.5 mg by mouth daily at 6 PM. MANAGED MY PMD 09/07/15   Modena Jansky, MD    Physical Exam:    Physical Exam: Vitals:   06/30/19 1812 06/30/19 1815 06/30/19 1900  BP:  105/71 (!) 110/53  Pulse:  Marland Kitchen)  115 (!) 42  Resp:  (!) 27 20  Temp:  97.7 F (36.5 C)   TempSrc:  Oral   SpO2:  95% 95%  Weight: 113.4 kg    Height: 5\' 8"  (1.727 m)      Constitutional: NAD, calm, comfortable Vitals:   06/30/19 1812 06/30/19 1815 06/30/19 1900  BP:  105/71 (!) 110/53  Pulse:  (!) 115 (!) 42  Resp:  (!) 27 20  Temp:  97.7 F (36.5 C)   TempSrc:  Oral   SpO2:  95% 95%  Weight: 113.4 kg    Height: 5\' 8"  (1.727 m)     Eyes: PERRL, lids and conjunctivae normal ENMT: Mucous membranes are moist. Posterior pharynx clear of any exudate or lesions.Normal dentition.  Neck: normal, supple, no masses, no thyromegaly Respiratory: Bilateral basal crepitations, decreased air entry on left base, no accessory muscle use.  Cardiovascular: Irregular rhythm, tachycardia, no murmurs / rubs / gallops.  Trace bilateral pedal edema. 2+ pedal pulses. No carotid bruits.  Abdomen: no tenderness, no masses palpated. No hepatosplenomegaly. Bowel sounds positive.  Musculoskeletal: no clubbing / cyanosis. No joint deformity upper and lower extremities. Good ROM, no contractures. Normal muscle tone.  Skin: no rashes, lesions, ulcers. No induration Neurologic: CN 2-12 grossly intact. Sensation intact, DTR normal.  Power is 4/5 right lower extremity.  5/5 in both upper extremity and left lower  extremity. Psychiatric: Normal judgment and insight. Alert and oriented x 3. Normal mood.    Decubitus Ulcers: Not present on admission Catheters and tubes: None  Data Review:    Labs on Admission: I have personally reviewed following labs and imaging studies  CBC: Recent Labs  Lab 06/30/19 1952  WBC 6.6  NEUTROABS 5.4  HGB 12.1*  HCT 37.8*  MCV 95.2  PLT [id number]*   Basic Metabolic Panel: Recent Labs  Lab 06/30/19 1952  NA 137  K 5.0  CL 104  CO2 25  GLUCOSE 119*  BUN 42*  CREATININE 1.40*  CALCIUM 9.0  MG 2.4   GFR: Estimated Creatinine Clearance: 49.7 mL/min (A) (by C-G formula based on SCr of 1.4 mg/dL (H)). Liver Function Tests: No results for input(s): AST, ALT, ALKPHOS, BILITOT, PROT, ALBUMIN in the last 168 hours. No results for input(s): LIPASE, AMYLASE in the last 168 hours. No results for input(s): AMMONIA in the last 168 hours. Coagulation Profile: Recent Labs  Lab 06/30/19 1952  INR 1.2   Cardiac Enzymes: No results for input(s): CKTOTAL, CKMB, CKMBINDEX, TROPONINI in the last 168 hours. BNP (last 3 results) No results for input(s): PROBNP in the last 8760 hours. HbA1C: No results for input(s): HGBA1C in the last 72 hours. CBG: No results for input(s): GLUCAP in the last 168 hours. Lipid Profile: No results for input(s): CHOL, HDL, LDLCALC, TRIG, CHOLHDL, LDLDIRECT in the last 72 hours. Thyroid Function Tests: No results for input(s): TSH, T4TOTAL, FREET4, T3FREE, THYROIDAB in the last 72 hours. Anemia Panel: No results for input(s): VITAMINB12, FOLATE, FERRITIN, TIBC, IRON, RETICCTPCT in the last 72 hours. Urine analysis:    Component Value Date/Time   COLORURINE YELLOW 08/31/2015 0038   APPEARANCEUR CLEAR 08/31/2015 0038   LABSPEC 1.020 08/31/2015 0038   PHURINE 5.5 08/31/2015 0038   GLUCOSEU NEGATIVE 08/31/2015 0038   HGBUR SMALL (A) 08/31/2015 0038   BILIRUBINUR NEGATIVE 08/31/2015 0038   KETONESUR NEGATIVE 08/31/2015 0038    PROTEINUR NEGATIVE 08/31/2015 0038   UROBILINOGEN 1.0 10/07/2014 1638   NITRITE NEGATIVE 08/31/2015 0038   LEUKOCYTESUR  MODERATE (A) 08/31/2015 0038     Imaging Results:      Radiological Exams on Admission: CT Chest W Contrast  Result Date: 06/30/2019 CLINICAL DATA:  Abnormal chest radiograph, loculated pleural effusion EXAM: CT CHEST WITH CONTRAST TECHNIQUE: Multidetector CT imaging of the chest was performed during intravenous contrast administration. CONTRAST:  47mL OMNIPAQUE IOHEXOL 300 MG/ML  SOLN COMPARISON:  Chest radiograph dated 06/30/2019 FINDINGS: Cardiovascular: Heart is normal in size.  No pericardial effusion. No evidence of thoracic aortic aneurysm. Atherosclerotic calcifications of the aortic arch. Three vessel coronary atherosclerosis. Postsurgical changes related to prior CABG. Mediastinum/Nodes: Small mediastinal lymph nodes, including a 13 mm right azygoesophageal recess node and a 12 mm inter lobar right lower lobe node (series 2/image 70), likely reactive. Visualized thyroid is unremarkable. Lungs/Pleura: Subpleural scarring/rounded atelectasis in the left upper lobe (series 4/image 59). Rounded atelectasis in the left lower lobe (series 4/image 87). Small left pleural effusion with pleural thickening (series 2/image 118), favoring a chronic loculated pleural effusion (given the associated findings) over an empyema. Faint ground-glass opacities in the right upper and lower lobes (for example, series 4/image 47) suggest very mild edema or less likely infection. Mild right basilar opacity, likely atelectasis. Mild centrilobular and paraseptal emphysematous changes, right upper lobe predominant. No pneumothorax. Upper Abdomen: Visualized upper abdomen is grossly unremarkable. Musculoskeletal: Degenerative changes of the visualized thoracolumbar spine. Median sternotomy. IMPRESSION: Rounded atelectasis in the left upper and lower lobes, likely accounting for the radiographic  abnormality. Small loculated left pleural effusion with pleural thickening, likely chronic. Empyema is considered unlikely given the associated findings. Faint ground-glass opacities in the right lung suggests mild edema or less likely infection. Aortic Atherosclerosis (ICD10-I70.0) and Emphysema (ICD10-J43.9). Electronically Signed   By: Julian Hy M.D.   On: 06/30/2019 21:49   DG Chest Port 1 View  Result Date: 06/30/2019 CLINICAL DATA:  Dyspnea EXAM: PORTABLE CHEST 1 VIEW COMPARISON:  09/01/2015 FINDINGS: Post sternotomy changes. Probable trace right-sided pleural effusion. Small left loculated pleural effusion or pleural thickening. Irregular peripheral opacity in the left mid lung contiguous with the pleural surface. Consolidation at the left base. Stable cardiomediastinal silhouette. No pneumothorax. IMPRESSION: 1. Left loculated pleural effusion or pleural thickening with irregular opacity in the left mid lung extending to the pleural surface, suggest chest CT for further evaluation. There is also airspace disease at the left base 2. Probable trace right pleural effusion Electronically Signed   By: Donavan Foil M.D.   On: 06/30/2019 20:20      Lynetta Mare MD [facility]  If 7PM-7AM, please contact night-coverage   06/30/2019, 11:51 PM

## 2019-07-01 NOTE — Progress Notes (Signed)
PT Cancellation Note  Patient Details Name: Benjamin Cordova MRN: [id number] DOB: [birth year]   Cancelled Treatment:    Reason Eval/Treat Not Completed: Medical issues which prohibited therapy.  Patient transferred to a higher level of care and will need new PT consult resume therapy when patient is medically stable.  Thank you.   10:00 AM, 07/01/19 Lonell Grandchild, MPT Physical Therapist with [facility] [phone] office [phone] mobile phone

## 2019-07-01 NOTE — Progress Notes (Signed)
*  PRELIMINARY RESULTS* Echocardiogram 2D Echocardiogram LIMITED WITH DEFINITY has been performed.  Leavy Cella 07/01/2019, 2:15 PM

## 2019-07-01 NOTE — Plan of Care (Signed)

## 2019-07-01 NOTE — Consult Note (Addendum)
Cardiology Consult    Patient ID: Benjamin Cordova; SN:[id number]; 12/14/1934   Admit date: 06/30/2019 Date of Consult: 07/01/2019  Primary Care Provider: Celene Squibb, MD Primary Cardiologist: Benjamin Lesches, MD   Patient Profile    Benjamin Cordova is a 85 y.o. male with past medical history of CAD (s/p CABG in 1981, cath in 10/2014 showing occluded grafts --> redo CABG in LIMA-LAD and Left Radial-OM2), chronic combined systolic and diastolic CHF (EF [id number] in 2017, 40-50% in 03/2018, at 30-35% in 03/2019), history of LV apical thrombus, HTN, HLD and history of CVA (on Coumadin per PCP) who is being seen today for the evaluation of CHF at the request of Dr. Dyann Cordova.   History of Present Illness    Benjamin Cordova was evaluated by Dr. Domenic Cordova in 03/2019 after recent echocardiogram had shown a decline in EF to 30-35%. He denied any progressive angina but was fairly sedentary and using a rolling walker. He was continued on Coreg 12.5mg  BID, Lasix 20mg  daily, Entresto 24-26mg  BID, Simvastatin 40mg  daily and HCTZ was discontinued and transitioned to Spironolactone 25mg  daily. Options were reviewed in regards to re-look cath but at that time he did not wish to pursue aggressive measures. He did follow-up with Benjamin Dung, NP in 06/2019 and denied any progressive symptoms at that time. Entresto was further titrated to 49-51mg  BID with plans for follow-up labs in 2 weeks. Weight was 248 lbs at that time.   He presented to [facility] ED on 06/30/2019 for evaluation of worsening weakness and dyspnea.   Initial labs show WBC 6.6, Hgb 12.1, platelets 135, Na+ 137, K+ 5.0 and creatinine 1.40 (creatinine at 1.17 in 03/2019). Mg 2.4. Initial and delta HS Troponin negative at 14. BNP 630. COVID pending. CXR showed left loculated pleural effusion or thickening and trace right pleural effusion. Chest CT showed rounded atelectasis in the left upper and lower lobes along with small loculated left pleural effusion with  pleural thickening, likely chronic. EKG showed NSR, HR 96 with PAC's and PVC's and nonspecific IVCD.    He was admitted for an acute CHF exacerbation and Entresto was reduced to 24-26mg  BID given his elevated renal function. Spironolactone was also reduced to 12.5mg  daily given K+ at 5.0. H&P mentions history of PAF but this has not been documented in recent Cardiology notes. He received IV Lasix 20mg  while in the ED and was started on IV Lasix 30mg  BID by the admitting team.   Overnight, he developed worsening dyspnea and oxygen saturations were in the 70's on RA and improved with 2L [name]. Telemetry showed sinus tachycardia with HR in 110's and frequent PAC's and PVC's. He developed worsening respiratory distress this AM. ABG showed pH 7.174, pCO2 79.2 and pO2 146. Repeat HS Troponin pending.   Patient currently on NRB and unable to answer in full sentences but reports he was short of breath prior to admission. Denies any chest pain or palpitations. Does have issues with lower extremity edema.    Past Medical History:  Diagnosis Date  . Arthritis   . Coronary artery disease    Multivessel s/p CABG 1981 at [facility]  . Essential hypertension   . Hematuria   . History of blood transfusion 07/2015   Knee replacement HgB <9  . History of pneumonia   . History of stroke   . Ischemic cardiomyopathy   . Mixed hyperlipidemia   . Myocardial infarction ([place]) 1980  . Peripheral vascular disease ([place])   .  Polio osteopathy of lower leg ([place]) Age 36   Affected right leg  . Skin cancer   . TIA (transient ischemic attack) 1989   Chronic coumadin  . UTI (lower urinary tract infection)     Past Surgical History:  Procedure Laterality Date  . CARDIAC CATHETERIZATION N/A 10/05/2014   Procedure: Left Heart Cath and Coronary Angiography;  Surgeon: Peter M Martinique, MD;  Location: [place] [facility];  Service: Cardiovascular;  Laterality: N/A;  . CORONARY ARTERY BYPASS GRAFT  1981   [facility]  . CORONARY  ARTERY BYPASS GRAFT N/A 10/08/2014   Procedure: REDO CORONARY ARTERY BYPASS GRAFTING (CABG) Times Two Grafts using Left Internal Mammary and Left Radial Artery;  Surgeon: Melrose Nakayama, MD;  Location: [place];  Service: Open Heart Surgery;  Laterality: N/A;  . CYSTOSCOPY WITH INJECTION  10/10/2010   Procedure: CYSTOSCOPY WITH INJECTION;  Surgeon: Marissa Nestle;  Location: [facility];  Service: Urology;  Laterality: N/A;  with retrograde urethrogram  . FOOT SURGERY Right 1954   [facility], muscle implantation  . JOINT REPLACEMENT    . RADIAL ARTERY HARVEST Left 10/08/2014   Procedure: Left RADIAL ARTERY HARVEST;  Surgeon: Melrose Nakayama, MD;  Location: [place];  Service: Open Heart Surgery;  Laterality: Left;  . TEE WITHOUT CARDIOVERSION N/A 10/08/2014   Procedure: TRANSESOPHAGEAL ECHOCARDIOGRAM (TEE);  Surgeon: Melrose Nakayama, MD;  Location: [place];  Service: Open Heart Surgery;  Laterality: N/A;  . TONSILLECTOMY  1949   [facility]  . TOTAL KNEE ARTHROPLASTY Left 07/27/2015   Procedure: TOTAL LEFT KNEE ARTHROPLASTY;  Surgeon: [name], MD;  Location: [place];  Service: Orthopedics;  Laterality: Left;     Home Medications:  Prior to Admission medications   Medication Sig Start Date End Date Taking? Authorizing Provider  B Complex-C-Folic Acid (SUPER B-COMPLEX/VIT C/FA) TABS Take 1 tablet by mouth daily.      [provider]  carvedilol (COREG) 12.5 MG tablet TAKE 1 TABLET TWICE DAILY  (STOP  ATENOLOL) Patient taking differently: Take 12.5 mg by mouth 2 (two) times daily with a meal.  10/08/16   Satira Sark, MD  diphenhydramine-acetaminophen (TYLENOL PM) 25-500 MG TABS tablet Take 2 tablets by mouth at bedtime as needed.    [provider]  finasteride (PROSCAR) 5 MG tablet Take 5 mg by mouth daily. 04/20/19   [provider]  folic acid (FOLVITE) [id number] MCG tablet Take 400 mcg by mouth daily.     [provider]  furosemide (LASIX) 20 MG tablet Take 20 mg  by mouth daily.  03/18/18   [provider]  Multiple Vitamins-Minerals (CERTAVITE/ANTIOXIDANTS) TABS Take 1 tablet by mouth daily.     [provider]  Omega-3 Fatty Acids (FISH OIL) 500 MG CAPS Take 500 mg by mouth at bedtime.    [provider]  potassium chloride (K-DUR) 10 MEQ tablet Take 10 mEq by mouth daily. Take with Lasix (Furosemide) 03/18/18   [provider]  sacubitril-valsartan (ENTRESTO) 49-51 MG Take 1 tablet by mouth 2 (two) times daily. 06/18/19   Verta Ellen., NP  Saw [place]metto 500 MG CAPS Take 1 capsule by mouth 2 (two) times daily.    [provider]  simvastatin (ZOCOR) 40 MG tablet Take 40 mg by mouth daily.    [provider]  spironolactone (ALDACTONE) 25 MG tablet Take 1 tablet (25 mg total) by mouth daily. 03/20/19 06/30/19  Satira Sark, MD  tamsulosin (  FLOMAX) 0.4 MG CAPS capsule Take 0.4 mg by mouth daily. 12/02/18   [provider]  vitamin C (ASCORBIC ACID) 500 MG tablet Take 500 mg by mouth 2 (two) times daily.     [provider]  warfarin (COUMADIN) 5 MG tablet Take 1.5 tablets (7.5 mg total) by mouth daily at 6 PM. OR AS DIRECTED by MD. Patient taking differently: Take 7.5 mg by mouth daily at 6 PM. MANAGED MY PMD 09/07/15   Modena Jansky, MD    Inpatient Medications: Scheduled Meds: . vitamin C  500 mg Oral BID  . carvedilol  12.5 mg Oral BID WC  . Chlorhexidine Gluconate Cloth  6 each Topical Daily  . finasteride  5 mg Oral Daily  . folic acid  [id number] mcg Oral Daily  . furosemide      . furosemide  40 mg Intravenous BID  . mouth rinse  15 mL Mouth Rinse BID  . multivitamin with minerals  1 tablet Oral Daily  . omega-3 acid ethyl esters  1 g Oral Daily  . [START ON 07/02/2019] pneumococcal 23 valent vaccine  0.5 mL Intramuscular Tomorrow-1000  . sacubitril-valsartan  1 tablet Oral BID  . simvastatin  40 mg Oral q1800  . sodium chloride flush  3 mL Intravenous Q12H  .  tamsulosin  0.4 mg Oral Daily  . warfarin  7.5 mg Oral q1800  . Warfarin - Physician Dosing Inpatient   Does not apply q1600   Continuous Infusions: . sodium chloride     PRN Meds: sodium chloride, acetaminophen **OR** acetaminophen, diphenhydrAMINE **AND** acetaminophen, bisacodyl, metoprolol tartrate, ondansetron **OR** ondansetron (ZOFRAN) IV, polyethylene glycol, sodium chloride flush, traMADol  Allergies:   No Known Allergies  Social History:   Social History   Socioeconomic History  . Marital status: Widowed    Spouse name: Not on file  . Number of children: Not on file  . Years of education: Not on file  . Highest education level: Not on file  Occupational History  . Not on file  Tobacco Use  . Smoking status: Former Smoker    Packs/day: 1.00    Years: 28.00    Pack years: 28.00    Types: Cigarettes    Quit date: 09/22/1978    Years since quitting: 40.8  . Smokeless tobacco: Never Used  Substance and Sexual Activity  . Alcohol use: No    Alcohol/week: 0.0 standard drinks  . Drug use: No  . Sexual activity: Not on file  Other Topics Concern  . Not on file  Social History Narrative  . Not on file   Social Determinants of Health   Financial Resource Strain:   . Difficulty of Paying Living Expenses:   Food Insecurity:   . Worried About Charity fundraiser in the Last Year:   . Arboriculturist in the Last Year:   Transportation Needs:   . Film/video editor (Medical):   Marland Kitchen Lack of Transportation (Non-Medical):   Physical Activity:   . Days of Exercise per Week:   . Minutes of Exercise per Session:   Stress:   . Feeling of Stress :   Social Connections:   . Frequency of Communication with Friends and Family:   . Frequency of Social Gatherings with Friends and Family:   . Attends Religious Services:   . Active Member of Clubs or Organizations:   . Attends Archivist Meetings:   Marland Kitchen Marital Status:   Intimate Partner Violence:   .  Fear of  Current or Ex-Partner:   . Emotionally Abused:   Marland Kitchen Physically Abused:   . Sexually Abused:      Family History:    Family History  Problem Relation Age of Onset  . Heart attack Mother   . Diabetes Mother   . Heart attack Father   . Heart attack Brother   . Anesthesia problems Neg Hx   . Hypotension Neg Hx   . Malignant hyperthermia Neg Hx   . Pseudochol deficiency Neg Hx       Review of Systems    General:  No chills, fever, night sweats or weight changes. Positive for generalized weakness.  Cardiovascular:  No chest pain, edema, orthopnea, palpitations, paroxysmal nocturnal dyspnea. Positive for dyspnea on exertion.  Dermatological: No rash, lesions/masses Respiratory: No cough, dyspnea Urologic: No hematuria, dysuria Abdominal:   No nausea, vomiting, diarrhea, bright red blood per rectum, melena, or hematemesis Neurologic:  No visual changes, changes in mental status. All other systems reviewed and are otherwise negative except as noted above.  Physical Exam/Data    Vitals:   07/01/19 0420 07/01/19 0653 07/01/19 0923 07/01/19 1011  BP: 106/82 (!) 142/64 (!) 147/87   Pulse: 95 93 (!) 111 (!) 107  Resp: (!) 24 (!) 24 (!) 45 (!) 27  Temp: 98 F (36.7 C) 97.8 F (36.6 C)    TempSrc: Oral Oral    SpO2: 97% 98%    Weight:      Height:        Intake/Output Summary (Last 24 hours) at 07/01/2019 1049 Last data filed at 07/01/2019 0600 Gross per 24 hour  Intake 123 ml  Output 300 ml  Net -177 ml   Filed Weights   06/30/19 1812 07/01/19 0100  Weight: 113.4 kg 111.3 kg   Body mass index is 37.31 kg/m.   General: Pleasant male, on NRB. Appears uncomfortable.  Psych: Normal affect. Neuro: Alert and oriented X 3. Moves all extremities spontaneously. HEENT: Normal  Neck: Supple without bruits. JVD at 10 cm. Lungs:  Resp regular and unlabored, rales along bases bilaterally. Heart: RRR with frequent ectopic beats. no s3, s4, or murmurs. Abdomen: Soft, non-tender,  non-distended, BS + x 4.  Extremities: No clubbing or cyanosis. 1+ pitting edema bilaterally. DP/PT/Radials 2+ and equal bilaterally.   EKG:  The EKG was personally reviewed and demonstrates: NSR, HR 96 with PAC's and PVC's and nonspecific IVCD.      Labs/Studies     Relevant CV Studies:  Cardiac Catheterization: 10/2014  Prox LAD lesion, 80% stenosed.  Mid LAD lesion, 100% stenosed.  1st Diag-1 lesion, 90% stenosed.  1st Diag-2 lesion, 100% stenosed.  1st Mrg lesion, 100% stenosed.  2nd Mrg lesion, 90% stenosed.  Prox RCA to Mid RCA lesion, 90% stenosed.  Mid RCA to Dist RCA lesion, 100% stenosed.  Acute Mrg lesion, 90% stenosed.  SVG .  Origin lesion, before 1st Diag, 100% stenosed.  Origin lesion, 100% stenosed.  Origin lesion, 100% stenosed.  There is moderate to severe left ventricular systolic dysfunction.   1. Severe 3 vessel obstructive/occlusive CAD. 2. All grafts are occluded including SVG to diagonal/LAD, SVG to OM1, and SVG to RCA. 3. Moderate to severe LV dysfunction. 4. The LIMA is widely patent as was not used previously for bypass.  Recommendation: consider for redo CABG   Echocardiogram: 03/2019 IMPRESSIONS    1. Left ventricular ejection fraction, by visual estimation, is 30 to  35%. The left ventricle has low normal  function. There is mildly increased  left ventricular hypertrophy.  2. Definity contrast agent was given IV to delineate the left ventricular  endocardial borders.  3. Left ventricular diastolic parameters are indeterminate.  4. Mildly dilated left ventricular internal cavity size.  5. The left ventricle demonstrates global hypokinesis.  6. The LV apex is akinetic.  7. Global right ventricle has normal systolic function.The right  ventricular size is normal. No increase in right ventricular wall  thickness.  8. Left atrial size was moderately dilated.  9. Right atrial size was not well visualized.  10. The  mitral valve was not well visualized. Mild mitral valve  regurgitation. No evidence of mitral stenosis.  11. The tricuspid valve is not well visualized. Tricuspid valve  regurgitation is not demonstrated.  12. The aortic valve is tricuspid. Aortic valve regurgitation is not  visualized. No evidence of aortic valve sclerosis or stenosis.  13. The pulmonic valve was not well visualized. Pulmonic valve  regurgitation is not visualized.  14. Mildly elevated pulmonary artery systolic pressure.  15. The interatrial septum was not well visualized.   Laboratory Data:  Chemistry Recent Labs  Lab 06/30/19 1952 07/01/19 0534  NA 137 138  K 5.0 5.2*  CL 104 103  CO2 25 27  GLUCOSE 119* 138*  BUN 42* 38*  CREATININE 1.40* 1.10  CALCIUM 9.0 8.9  GFRNONAA 46* >60  GFRAA 54* >60  ANIONGAP 8 8    No results for input(s): PROT, ALBUMIN, AST, ALT, ALKPHOS, BILITOT in the last 168 hours. Hematology Recent Labs  Lab 06/30/19 1952 07/01/19 0534  WBC 6.6 9.6  RBC 3.97* 3.97*  HGB 12.1* 11.8*  HCT 37.8* 38.3*  MCV 95.2 96.5  MCH 30.5 29.7  MCHC 32.0 30.8  RDW 13.6 13.7  PLT 135* 154   Cardiac EnzymesNo results for input(s): TROPONINI in the last 168 hours. No results for input(s): TROPIPOC in the last 168 hours.  BNP Recent Labs  Lab 06/30/19 2000  BNP 630.0*    DDimer No results for input(s): DDIMER in the last 168 hours.  Radiology/Studies:  CT Chest W Contrast  Result Date: 06/30/2019 CLINICAL DATA:  Abnormal chest radiograph, loculated pleural effusion EXAM: CT CHEST WITH CONTRAST TECHNIQUE: Multidetector CT imaging of the chest was performed during intravenous contrast administration. CONTRAST:  61mL OMNIPAQUE IOHEXOL 300 MG/ML  SOLN COMPARISON:  Chest radiograph dated 06/30/2019 FINDINGS: Cardiovascular: Heart is normal in size.  No pericardial effusion. No evidence of thoracic aortic aneurysm. Atherosclerotic calcifications of the aortic arch. Three vessel coronary  atherosclerosis. Postsurgical changes related to prior CABG. Mediastinum/Nodes: Small mediastinal lymph nodes, including a 13 mm right azygoesophageal recess node and a 12 mm inter lobar right lower lobe node (series 2/image 70), likely reactive. Visualized thyroid is unremarkable. Lungs/Pleura: Subpleural scarring/rounded atelectasis in the left upper lobe (series 4/image 59). Rounded atelectasis in the left lower lobe (series 4/image 87). Small left pleural effusion with pleural thickening (series 2/image 118), favoring a chronic loculated pleural effusion (given the associated findings) over an empyema. Faint ground-glass opacities in the right upper and lower lobes (for example, series 4/image 47) suggest very mild edema or less likely infection. Mild right basilar opacity, likely atelectasis. Mild centrilobular and paraseptal emphysematous changes, right upper lobe predominant. No pneumothorax. Upper Abdomen: Visualized upper abdomen is grossly unremarkable. Musculoskeletal: Degenerative changes of the visualized thoracolumbar spine. Median sternotomy. IMPRESSION: Rounded atelectasis in the left upper and lower lobes, likely accounting for the radiographic abnormality. Small loculated left pleural  effusion with pleural thickening, likely chronic. Empyema is considered unlikely given the associated findings. Faint ground-glass opacities in the right lung suggests mild edema or less likely infection. Aortic Atherosclerosis (ICD10-I70.0) and Emphysema (ICD10-J43.9). Electronically Signed   By: Julian Hy M.D.   On: 06/30/2019 21:49   DG Chest Port 1 View  Result Date: 06/30/2019 CLINICAL DATA:  Dyspnea EXAM: PORTABLE CHEST 1 VIEW COMPARISON:  09/01/2015 FINDINGS: Post sternotomy changes. Probable trace right-sided pleural effusion. Small left loculated pleural effusion or pleural thickening. Irregular peripheral opacity in the left mid lung contiguous with the pleural surface. Consolidation at the left  base. Stable cardiomediastinal silhouette. No pneumothorax. IMPRESSION: 1. Left loculated pleural effusion or pleural thickening with irregular opacity in the left mid lung extending to the pleural surface, suggest chest CT for further evaluation. There is also airspace disease at the left base 2. Probable trace right pleural effusion Electronically Signed   By: Donavan Foil M.D.   On: 06/30/2019 20:20     Assessment & Plan    1. Acute on Chronic Combined Systolic and Diastolic CHF - EF previously 35-40% in 2017, 40-50% in 03/2018, at 30-35% by most recent imaging in 03/2019. - presented with worsening dyspnea and weakness, found to have an acute CHF exacerbation. BNP 630. Chest CT with pleural effusions. Repeat limited echo pending.  - developed worsening respiratory distress this AM and ABG showed pH 7.174, pCO2 79.2 and pO2 146. Now being transitioned from NRB to BiPAP. Will give IV Lasix 40mg  now. May need to re-dose in the next few hours pending response as he only received 20mg  upon admission.  - continue Coreg and Entresto at current dosing. Spironolactone held given hyperkalemia.   2. CAD - he is s/p CABG in 1981, cath in 10/2014 showing occluded grafts --> redo CABG in LIMA-LAD and Left Radial-OM2. Repeat ischemic evaluation previously reviewed at office visits and he was not interested in an aggressive approach at that time.  - HS Troponin values negative on admission and repeat values pending following worsening respiratory status this morning. Will plan to obtain a limited echo to reassess EF and wall motion.  - continue BB and statin therapy. Not on ASA given the need for anticoagulation.   ADDENDUM: Repeat 12-Lead EKG shows TWI along the inferior leads which is significantly more pronounced as compared to prior tracings. Reviewed with Dr. Bronson Ing and will continue with diuresis for now. Once respiratory status improves, would need to discuss repeat ischemic evaluation. May need to  stop Coumadin and bridge with Heparin (INR 1.3 this AM). Repeat limited echo also pending.   3. History of LV Thrombus - most recent echocardiogram showed the LV apex is akinetic but no clear evidence of thrombus. On Coumadin per PCP due to history of CVA.   4. HTN - BP has been well-controlled since admission. Continue Coreg 12.5mg  BID and Entresto 24-26mg  BID. Spironolactone currently held given hyperkalemia.   5. HLD - he has been continued on PTA Simvastatin 40mg  daily.   6. AKI - baseline creatinine 1.0 - 1.1. Elevated to 1.40 on admission, improved to 1.10 today. Follow with IV diuresis.    For questions or updates, please contact [place] Please consult [email or web address] for contact info under Cardiology/STEMI.  Signed, Erma Heritage, PA-C 07/01/2019, 10:49 AM Pager: [phone]  The patient was seen and examined, and I agree with the history, physical exam, assessment and plan as documented above, with modifications made above and as noted below. I have  also personally reviewed all relevant documentation, old records, labs, and both radiographic and cardiovascular studies. I have also independently interpreted old and new ECG's.  Briefly, this is an 85 year old male with a history of coronary disease with initial CABG in 52 and redo CABG in 73, chronic combined heart failure, and history of CVA.  He was recently evaluated in our office and was noted to have progressive symptoms for which reason Delene Loll was increased to 49-51 mg twice daily.  Most recent echocardiogram in December 2020 showed LVEF 30 to 35%.  He then presented to the ED on 06/30/2019 for worsening shortness of breath and generalized weakness.  Creatinine was noted to be elevated at 1.4 and BNP was six hundred and thirty.  Chest x-ray and chest CT reviewed above with abnormalities as described.  Due to worsening renal function, internal medicine reduce the dose of Entresto and  spironolactone as potassium is also five.  He then received 20 mg of IV Lasix in the ED and started on 30 mg IV twice daily.  He was noted be markedly dyspneic this morning with hypoxemia with O2 saturations in the seventy range.  He was also tachycardic and noted to have atrial bigeminy.  Respiratory status continued decline and he was transferred to the ICU and placed on BiPAP.  We then ordered an additional 40 mg of IV Lasix.  I noted 400 cc in the Foley bag.  Troponins are nonspecifically elevated.  I personally reviewed all ECGs which are described in detail above.  A repeat limited echocardiogram has been ordered and is pending.  He would likely require an additional 40 mg of IV Lasix earlier this afternoon and later this evening.  Continue carvedilol and Entresto.  Spironolactone on hold given potassium of five.  He is on warfarin for anticoagulation as per the patient's PCP for history of CVA.    Kate Sable, MD, [facility]  07/01/2019 10:57 AM

## 2019-07-01 NOTE — Progress Notes (Addendum)
PROGRESS NOTE    Benjamin Cordova  [id number] DOB: 06-22-1934 DOA: 06/30/2019 PCP: Celene Squibb, MD     Brief Narrative:  As per H&P written by Dr. Scherrie November on 06/30/19 HPI: Benjamin Cordova is a 85 y.o. male with medical history significant of ischemic cardiomyopathy, coronary artery disease, hypertension, hyperlipidemia, peripheral vascular disease, history of TIA who presented to the ER with increased weakness.  Patient states he has been having increasing weakness at home and today was significantly worse and had difficulty with ambulation and shortness of breath with minimal activity.  Has a history of ischemic cardiomyopathy with EF of 30 to 35% and is on Entresto at home.  Did see the cardiologist as outpatient on 06/18/2019 and was his Delene Loll was increased at that time.  He has a history of polio in the right lower extremity and in general has been very sedentary at home over the last several weeks.  Does use a walker for ambulation. Has not had any chest pain.  No increased lower extremity edema.  No fever, chills, cough, palpitations.  No nausea, vomiting, abdominal pain, diarrhea, dysuria.  No seizures, syncope, dizziness, lightheadedness. ED Course:  Vital Signs reviewed on presentation, significant for Temperature 97.7, heart rate 108, blood pressure 116/59, saturation 93% on room air. Labs reviewed, significant for sodium 137, potassium 5.0, BUN 42, creatinine 1.4, BNP 630, troponin negative x2, WBC count 6.6, hemoglobin 12.1, hematocrit 37, platelets 135, INR 1.2. Imaging personally Reviewed, CT chest with IV contrast shows small loculated left pleural effusion with pleural thickening, likely chronic.  Rounded atelectasis in the left upper and lower lobes, faint groundglass opacities in the right lung suggesting mild edema. EKG personally reviewed, shows sinus tachycardia, PVCs as well as PACs.   Assessment & Plan: 1-acute on chronic systolic heart failure -Patient with recent  increase in his Entresto. -Ejection fraction in December 2020 of 30 to 35%. -Cardiology service has been consulted -Continue IV diuresis, lasix 30mg  BID -Close monitoring of electrolytes and renal function -Continue daily weights, low-sodium diet and strict I's and O's. -Entresto dose was decreased secondary to worsening of his renal function -Spironolactone has been placed on hold.  2-acute respiratory failure with hypoxia and hypercapnia -Appears to be in the setting of CHF exacerbation as mentioned above -Follow 2D echo -Follow response to diuresis -Pulmicort has been started along with as needed duo nebs -Patient having difficulty speaking in full sentences. -Repeat chest x-ray -ABG: demonstrating PH of 7.174 and CO2 of 79.2, bicarb 22.3 -will start patient on BIPAP  3-history of paroxysmal atrial fibrillation -Rate control and currently sinus -Continue monitoring on telemetry -Continue beta-blocker -Continue Coumadin per pharmacy.  4-hypertension -Blood pressure stable and well-controlled -Continue to follow vital signs. -Continue heart healthy diet.  5-acute physical deconditioning and weakness -Will follow physical therapy evaluation and recommendations once medically stable.  6-history of coronary artery disease -Patient status post CABG x2 -Complaining of diaphoresis and chest tightness currently. -EKG no demonstrating acute ischemic changes -On admission troponin x3 were negative. -Continue monitoring on telemetry -Repeat troponin -Continue beta-blocker and statins -Cardiology service consulted.  7-BPH -Continue Flomax and finasteride -Denies urinary retention symptoms.  8-acute kidney injury -Appears to be associated with recent increase in his Entresto medication -Dose has been reduced -Spironolactone also held -Continue IV diuresis -Maintain proper oral hydration -Follow renal function trend.   DVT prophylaxis: Chronically on Coumadin. Code  Status: Full code Family Communication: No family at bedside. Disposition Plan: Given his worsening respiratory  status, chest tightness and diaphoresis event will transfer to stepdown unit, wean off oxygen supplementation, continue IV diuresis, check ABG, troponin and limited echo.  Cardiology service has been consulted and will follow further recommendations.  As needed DuoNeb and Pulmicort has been started.  Given his decompensation patient status has been changed to inpatient and he will remain in the hospital until respiratory status stabilized.  Consultants:   Cardiology service  Procedures:   2D echo: Pending  See below for x-ray reports  Antimicrobials:  Anti-infectives (From admission, onward)   None       Subjective: Currently afebrile; patient reports episode of diaphoresis and chest tightness.  Tachypneic, unable to speak in full sentences and with increased labored breathing.  Objective: Vitals:   07/01/19 0110 07/01/19 0245 07/01/19 0420 07/01/19 0653  BP: 131/77 (!) 106/51 106/82 (!) 142/64  Pulse: 87 72 95 93  Resp: (!) 24 (!) 22 (!) 24 (!) 24  Temp: 97.7 F (36.5 C) 98.3 F (36.8 C) 98 F (36.7 C) 97.8 F (36.6 C)  TempSrc: Oral Oral Oral Oral  SpO2: 98% 99% 97% 98%  Weight:      Height:        Intake/Output Summary (Last 24 hours) at 07/01/2019 [id number] Last data filed at 07/01/2019 0600 Gross per 24 hour  Intake 123 ml  Output 300 ml  Net -177 ml   Filed Weights   06/30/19 1812 07/01/19 0100  Weight: 113.4 kg 111.3 kg    Examination: General exam: Alert, awake, and cooperative with examination; patient with visible tachypnea, increased work of breathing, unable to speak in full sentences and with mild use of accessory muscles.  Reports chest tightness.  No nausea, no vomiting, no productive cough. Respiratory system: Decreased breath sounds at the bases, fine crackles appreciated bilaterally; no wheezing; positive tachypnea.  On nonrebreather mask at  this moment.  Cardiovascular system: Rate controlled, no rubs, no gallops. Gastrointestinal system: Abdomen is soft, and nontender. No organomegaly or masses felt. Normal bowel sounds heard. Central nervous system: Alert and oriented. No focal neurological deficits. Extremities: No cyanosis or clubbing; 1+ edema appreciated bilaterally.  Right lower extremity smaller than his left lower extremity (chronic and secondary to prior polio). Skin: No rashes, no petechiae, no open wounds. Psychiatry: Judgement and insight appear normal. Mood & affect appropriate.     Data Reviewed: I have personally reviewed following labs and imaging studies  CBC: Recent Labs  Lab 06/30/19 1952 07/01/19 0534  WBC 6.6 9.6  NEUTROABS 5.4  --   HGB 12.1* 11.8*  HCT 37.8* 38.3*  MCV 95.2 96.5  PLT 135* [id number]   Basic Metabolic Panel: Recent Labs  Lab 06/30/19 1952 07/01/19 0534  NA 137 138  K 5.0 5.2*  CL 104 103  CO2 25 27  GLUCOSE 119* 138*  BUN 42* 38*  CREATININE 1.40* 1.10  CALCIUM 9.0 8.9  MG 2.4  --    GFR: Estimated Creatinine Clearance: 62.7 mL/min (by C-G formula based on SCr of 1.1 mg/dL).  Coagulation Profile: Recent Labs  Lab 06/30/19 1952 07/01/19 0534  INR 1.2 1.3*   CBG: Recent Labs  Lab 07/01/19 0857  GLUCAP 276*   Urine analysis:    Component Value Date/Time   COLORURINE YELLOW 07/01/2019 0445   APPEARANCEUR CLOUDY (A) 07/01/2019 0445   LABSPEC 1.027 07/01/2019 0445   PHURINE 5.0 07/01/2019 0445   GLUCOSEU NEGATIVE 07/01/2019 0445   HGBUR SMALL (A) 07/01/2019 [place] NEGATIVE 07/01/2019 0445  KETONESUR NEGATIVE 07/01/2019 0445   PROTEINUR NEGATIVE 07/01/2019 0445   UROBILINOGEN 1.0 10/07/2014 1638   NITRITE NEGATIVE 07/01/2019 0445   LEUKOCYTESUR LARGE (A) 07/01/2019 0445   Radiology Studies: CT Chest W Contrast  Result Date: 06/30/2019 CLINICAL DATA:  Abnormal chest radiograph, loculated pleural effusion EXAM: CT CHEST WITH CONTRAST  TECHNIQUE: Multidetector CT imaging of the chest was performed during intravenous contrast administration. CONTRAST:  64mL OMNIPAQUE IOHEXOL 300 MG/ML  SOLN COMPARISON:  Chest radiograph dated 06/30/2019 FINDINGS: Cardiovascular: Heart is normal in size.  No pericardial effusion. No evidence of thoracic aortic aneurysm. Atherosclerotic calcifications of the aortic arch. Three vessel coronary atherosclerosis. Postsurgical changes related to prior CABG. Mediastinum/Nodes: Small mediastinal lymph nodes, including a 13 mm right azygoesophageal recess node and a 12 mm inter lobar right lower lobe node (series 2/image 70), likely reactive. Visualized thyroid is unremarkable. Lungs/Pleura: Subpleural scarring/rounded atelectasis in the left upper lobe (series 4/image 59). Rounded atelectasis in the left lower lobe (series 4/image 87). Small left pleural effusion with pleural thickening (series 2/image 118), favoring a chronic loculated pleural effusion (given the associated findings) over an empyema. Faint ground-glass opacities in the right upper and lower lobes (for example, series 4/image 47) suggest very mild edema or less likely infection. Mild right basilar opacity, likely atelectasis. Mild centrilobular and paraseptal emphysematous changes, right upper lobe predominant. No pneumothorax. Upper Abdomen: Visualized upper abdomen is grossly unremarkable. Musculoskeletal: Degenerative changes of the visualized thoracolumbar spine. Median sternotomy. IMPRESSION: Rounded atelectasis in the left upper and lower lobes, likely accounting for the radiographic abnormality. Small loculated left pleural effusion with pleural thickening, likely chronic. Empyema is considered unlikely given the associated findings. Faint ground-glass opacities in the right lung suggests mild edema or less likely infection. Aortic Atherosclerosis (ICD10-I70.0) and Emphysema (ICD10-J43.9). Electronically Signed   By: Julian Hy M.D.   On:  06/30/2019 21:49   DG Chest Port 1 View  Result Date: 06/30/2019 CLINICAL DATA:  Dyspnea EXAM: PORTABLE CHEST 1 VIEW COMPARISON:  09/01/2015 FINDINGS: Post sternotomy changes. Probable trace right-sided pleural effusion. Small left loculated pleural effusion or pleural thickening. Irregular peripheral opacity in the left mid lung contiguous with the pleural surface. Consolidation at the left base. Stable cardiomediastinal silhouette. No pneumothorax. IMPRESSION: 1. Left loculated pleural effusion or pleural thickening with irregular opacity in the left mid lung extending to the pleural surface, suggest chest CT for further evaluation. There is also airspace disease at the left base 2. Probable trace right pleural effusion Electronically Signed   By: Donavan Foil M.D.   On: 06/30/2019 20:20    Scheduled Meds: . vitamin C  500 mg Oral BID  . carvedilol  12.5 mg Oral BID WC  . finasteride  5 mg Oral Daily  . folic acid  [id number] mcg Oral Daily  . furosemide  20 mg Intravenous Q12H  . mouth rinse  15 mL Mouth Rinse BID  . multivitamin with minerals  1 tablet Oral Daily  . omega-3 acid ethyl esters  1 g Oral Daily  . [START ON 07/02/2019] pneumococcal 23 valent vaccine  0.5 mL Intramuscular Tomorrow-1000  . sacubitril-valsartan  1 tablet Oral BID  . simvastatin  40 mg Oral q1800  . sodium chloride flush  3 mL Intravenous Q12H  . spironolactone  12.5 mg Oral Daily  . tamsulosin  0.4 mg Oral Daily  . warfarin  7.5 mg Oral q1800  . Warfarin - Physician Dosing Inpatient   Does not apply q1600   Continuous  Infusions: . sodium chloride       LOS: 0 days    Time spent: 35 minutes; more than 50% of the time dedicated to face-to-face examination, discussion with nursing staff and updating family; patient presenting with acute on chronic CHF exacerbation, chest tightness, diaphoresis and dyspnea on exertion.  First troponin on admission not elevated; but this morning complaining of worsening breathing,  another episode of diaphoresis and chest tightness.  Patient with bibasilar crackles on examination and 1+ edema on his lower extremities.  Potassium in high normal range at 5.2 and having difficulties speaking in full sentences.  Positive tachypnea, mild use of accessory muscles appreciated on exam.  Patient will be transferred to stepdown, will follow limited echo, repeat troponin, as needed duo nebs and initiation of Pulmicort.  Continue IV diuresis and check ABG.     Barton Dubois, MD [facility] Pager [phone]   07/01/2019, 9:04 AM

## 2019-07-01 NOTE — Progress Notes (Addendum)
ANTICOAGULATION CONSULT NOTE - Initial Consult  Pharmacy Consult for warfarin Indication: atrial fibrillation/ hx of LV thrombus  No Known Allergies  Patient Measurements: Height: 5\' 8"  (172.7 cm) Weight: 245 lb 6 oz (111.3 kg) IBW/kg (Calculated) : 68.4   Vital Signs: Temp: 97.8 F (36.6 C) (03/31 0653) Temp Source: Oral (03/31 0653) BP: 135/67 (03/31 1100) Pulse Rate: 48 (03/31 1100)  Labs: Recent Labs    06/30/19 1952 06/30/19 2213 07/01/19 0534 07/01/19 0938  HGB 12.1*  --  11.8*  --   HCT 37.8*  --  38.3*  --   PLT 135*  --  154  --   LABPROT 15.2  --  16.1*  --   INR 1.2  --  1.3*  --   CREATININE 1.40*  --  1.10  --   TROPONINIHS 14 14  --  21*    Estimated Creatinine Clearance: 62.7 mL/min (by C-G formula based on SCr of 1.1 mg/dL).   Medical History: Past Medical History:  Diagnosis Date  . Arthritis   . Coronary artery disease    Multivessel s/p CABG 1981 at [facility]  . Essential hypertension   . Hematuria   . History of blood transfusion 07/2015   Knee replacement HgB <9  . History of pneumonia   . History of stroke   . Ischemic cardiomyopathy   . Mixed hyperlipidemia   . Myocardial infarction ([place]) 1980  . Peripheral vascular disease ([place])   . 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 Skin cancer   . TIA (transient ischemic attack) 1989   Chronic coumadin  . UTI (lower urinary tract infection)     Medications:  Medications Prior to Admission  Medication Sig Dispense Refill Last Dose  . B Complex-C-Folic Acid (SUPER B-COMPLEX/VIT C/FA) TABS Take 1 tablet by mouth daily.       . carvedilol (COREG) 12.5 MG tablet TAKE 1 TABLET TWICE DAILY  (STOP  ATENOLOL) (Patient taking differently: Take 12.5 mg by mouth 2 (two) times daily with a meal. ) 180 tablet 3 unknown  . diphenhydramine-acetaminophen (TYLENOL PM) 25-500 MG TABS tablet Take 2 tablets by mouth at bedtime as needed.     . finasteride (PROSCAR) 5 MG tablet Take 5  mg by mouth daily.   unknown  . folic acid (FOLVITE) [id number] MCG tablet Take 400 mcg by mouth daily.      . furosemide (LASIX) 20 MG tablet Take 20 mg by mouth daily.    unknown  . Multiple Vitamins-Minerals (CERTAVITE/ANTIOXIDANTS) TABS Take 1 tablet by mouth daily.      . Omega-3 Fatty Acids (FISH OIL) 500 MG CAPS Take 500 mg by mouth at bedtime.     . potassium chloride (K-DUR) 10 MEQ tablet Take 10 mEq by mouth daily. Take with Lasix (Furosemide)   unknown  . sacubitril-valsartan (ENTRESTO) 49-51 MG Take 1 tablet by mouth 2 (two) times daily. 180 tablet 3 unknown  . Saw Palmetto 500 MG CAPS Take 1 capsule by mouth 2 (two) times daily.     . simvastatin (ZOCOR) 40 MG tablet Take 40 mg by mouth daily.     Marland Kitchen spironolactone (ALDACTONE) 25 MG tablet Take 1 tablet (25 mg total) by mouth daily. 90 tablet 3 unknown  . tamsulosin (FLOMAX) 0.4 MG CAPS capsule Take 0.4 mg by mouth daily.   unknown  . vitamin C (ASCORBIC ACID) 500 MG tablet Take 500 mg by mouth 2 (two) times  daily.      . warfarin (COUMADIN) 5 MG tablet Take 1.5 tablets (7.5 mg total) by mouth daily at 6 PM. OR AS DIRECTED by MD. (Patient taking differently: Take 7.5 mg by mouth daily at 6 PM. MANAGED MY PMD) 60 tablet 0     Assessment: Pharmacy consulted to dose warfarin in patient with atrial fibrillation.  Current INR 1.3.  Home dose listed as 7.5 mg daily per PTA meds.    Goal of Therapy:  INR 2-3 Monitor platelets by anticoagulation protocol: Yes   Plan:  Warfarin 7.5 mg daily. Monitor labs and s/s of bleeding.  Margot Ables, PharmD Clinical Pharmacist 07/01/2019 12:02 PM

## 2019-07-01 NOTE — Progress Notes (Signed)
OT Cancellation Note  Patient Details Name: Benjamin Cordova MRN: [id number] DOB: June 12, 1934   Cancelled Treatment:    Reason Eval/Treat Not Completed: Medical issues which prohibited therapy. Pt with significant change in condition, has been transferred to ICU. Per [facility] policy OT will sign off as pt has had a change in medical status and a new order will be required when pt is medically appropriate to participate.   Guadelupe Sabin, OTR/L  [phone] 07/01/2019, 9:43 AM

## 2019-07-01 NOTE — Progress Notes (Signed)
PHARMACIST - PHYSICIAN ORDER COMMUNICATION  CONCERNING: P&T Medication Policy on Herbal Medications  DESCRIPTION:  This patient's order for:  Saw palmetto  has been noted.  This product(s) is classified as an "herbal" or natural product. Due to a lack of definitive safety studies or FDA approval, nonstandard manufacturing practices, plus the potential risk of unknown drug-drug interactions while on inpatient medications, the Pharmacy and Therapeutics Committee does not permit the use of "herbal" or natural products of this type within [facility].   ACTION TAKEN: The pharmacy department is unable to verify this order at this time and your patient has been informed of this safety policy. Please reevaluate patient's clinical condition at discharge and address if the herbal or natural product(s) should be resumed at that time.   Thanks Dorrene German 07/01/2019 12:40 AM

## 2019-07-02 ENCOUNTER — Telehealth: Payer: Self-pay | Admitting: Cardiology

## 2019-07-02 LAB — TROPONIN I (HIGH SENSITIVITY): Troponin I (High Sensitivity): 32 ng/L — ABNORMAL HIGH (ref <18)

## 2019-07-02 LAB — BASIC METABOLIC PANEL
Anion gap: 13 (ref 5–15)
BUN: 44 mg/dL — ABNORMAL HIGH (ref 8–23)
CO2: 26 mmol/L (ref 22–32)
Calcium: 9.1 mg/dL (ref 8.9–10.3)
Chloride: 103 mmol/L (ref 98–111)
Creatinine, Ser: 1.25 mg/dL — ABNORMAL HIGH (ref 0.61–1.24)
GFR calc Af Amer: >60 mL/min (ref >60)
GFR calc non Af Amer: 53 mL/min — ABNORMAL LOW (ref >60)
Glucose, Bld: 86 mg/dL (ref 70–99)
Potassium: 4.5 mmol/L (ref 3.5–5.1)
Sodium: 142 mmol/L (ref 135–145)

## 2019-07-02 LAB — PROTIME-INR
INR: 1.3 — ABNORMAL HIGH (ref 0.8–1.2)
Prothrombin Time: 15.6 seconds — ABNORMAL HIGH (ref 11.4–15.2)

## 2019-07-02 LAB — MAGNESIUM: Magnesium: 2.3 mg/dL (ref 1.7–2.4)

## 2019-07-02 MED ORDER — WARFARIN SODIUM 5 MG PO TABS
10.0000 mg | ORAL_TABLET | Freq: Once | ORAL | Status: AC
  Administered 2019-07-02: 10 mg via ORAL
  Filled 2019-07-02: qty 2

## 2019-07-02 MED ORDER — WARFARIN - PHARMACIST DOSING INPATIENT: Freq: Every day | Status: DC

## 2019-07-02 MED ORDER — ALUM & MAG HYDROXIDE-SIMETH 200-200-20 MG/5ML PO SUSP
30.0000 mL | ORAL | Status: DC | PRN
  Administered 2019-07-02: 30 mL via ORAL
  Filled 2019-07-02: qty 30

## 2019-07-02 MED ORDER — FUROSEMIDE 10 MG/ML IJ SOLN
40.0000 mg | Freq: Every day | INTRAMUSCULAR | Status: DC
  Administered 2019-07-03: 40 mg via INTRAVENOUS
  Filled 2019-07-02: qty 4

## 2019-07-02 NOTE — Telephone Encounter (Signed)
Patient is currently admitted in [facility] - was suppose to have blood work for [employer] this morning.  He is asking if they have drawn what he wanted done?

## 2019-07-02 NOTE — Telephone Encounter (Signed)
Pt notified that labs done are the same as ordered by Dr. Domenic Polite.

## 2019-07-02 NOTE — Progress Notes (Signed)
Pt off the floor, transported to 300. Report given to [name] T.

## 2019-07-02 NOTE — Progress Notes (Signed)
Patient is currently on 4LNC with sats of 100%. Patient is in no distress and all vitals are stable. BIPAP is not needed at this time.

## 2019-07-02 NOTE — Progress Notes (Signed)
Progress Note  Patient Name: Benjamin Cordova Date of Encounter: 07/02/2019  Primary Cardiologist: Rozann Lesches, MD   Subjective   Sitting up in chair eating lunch. Denies chest pain, palpitations, and shortness of breath. He said "My legs always look this way".  Inpatient Medications    Scheduled Meds: . vitamin C  500 mg Oral BID  . budesonide (PULMICORT) nebulizer solution  0.5 mg Nebulization BID  . carvedilol  12.5 mg Oral BID WC  . Chlorhexidine Gluconate Cloth  6 each Topical Daily  . finasteride  5 mg Oral Daily  . folic acid  [id number] mcg Oral Daily  . furosemide  40 mg Intravenous BID  . mouth rinse  15 mL Mouth Rinse BID  . multivitamin with minerals  1 tablet Oral Daily  . omega-3 acid ethyl esters  1 g Oral Daily  . pneumococcal 23 valent vaccine  0.5 mL Intramuscular Tomorrow-1000  . sacubitril-valsartan  1 tablet Oral BID  . simvastatin  40 mg Oral q1800  . sodium chloride flush  3 mL Intravenous Q12H  . tamsulosin  0.4 mg Oral Daily  . warfarin  7.5 mg Oral q1800   Continuous Infusions: . sodium chloride     PRN Meds: sodium chloride, acetaminophen **OR** acetaminophen, diphenhydrAMINE **AND** acetaminophen, bisacodyl, ipratropium-albuterol, LORazepam, metoprolol tartrate, ondansetron **OR** ondansetron (ZOFRAN) IV, polyethylene glycol, sodium chloride flush, traMADol   Vital Signs    Vitals:   07/02/19 0600 07/02/19 0720 07/02/19 0759 07/02/19 0809  BP: (!) 119/108     Pulse: (!) 45 (!) 47    Resp: 19 (!) 24    Temp:  98.2 F (36.8 C)    TempSrc:  Oral    SpO2: 100% 97% 97% 99%  Weight:      Height:        Intake/Output Summary (Last 24 hours) at 07/02/2019 1157 Last data filed at 07/01/2019 2300 Gross per 24 hour  Intake --  Output 600 ml  Net -600 ml   Filed Weights   06/30/19 1812 07/01/19 0100 07/02/19 0500  Weight: 113.4 kg 111.3 kg 109.7 kg    Telemetry    A - Personally Reviewed  ECG    Sinus rhythm with frequent PAC's and  PVC's - Personally Reviewed  Physical Exam   GEN: No acute distress.   Neck: No JVD Cardiac: RRR with frequent premature contractions, no murmurs, rubs, or gallops.  Respiratory: Clear to auscultation bilaterally. GI: Soft, nontender, non-distended  MS: Trace b/l LE edema; No deformity. Neuro:  Nonfocal  Psych: Normal affect   Labs    Chemistry Recent Labs  Lab 06/30/19 1952 07/01/19 0534 07/02/19 0439  NA 137 138 142  K 5.0 5.2* 4.5  CL 104 103 103  CO2 25 27 26   GLUCOSE 119* 138* 86  BUN 42* 38* 44*  CREATININE 1.40* 1.10 1.25*  CALCIUM 9.0 8.9 9.1  GFRNONAA 46* >60 53*  GFRAA 54* >60 >60  ANIONGAP 8 8 13      Hematology Recent Labs  Lab 06/30/19 1952 07/01/19 0534  WBC 6.6 9.6  RBC 3.97* 3.97*  HGB 12.1* 11.8*  HCT 37.8* 38.3*  MCV 95.2 96.5  MCH 30.5 29.7  MCHC 32.0 30.8  RDW 13.6 13.7  PLT 135* 154    Cardiac EnzymesNo results for input(s): TROPONINI in the last 168 hours. No results for input(s): TROPIPOC in the last 168 hours.   BNP Recent Labs  Lab 06/30/19 2000  BNP 630.0*  DDimer No results for input(s): DDIMER in the last 168 hours.   Radiology    CT Chest W Contrast  Result Date: 06/30/2019 CLINICAL DATA:  Abnormal chest radiograph, loculated pleural effusion EXAM: CT CHEST WITH CONTRAST TECHNIQUE: Multidetector CT imaging of the chest was performed during intravenous contrast administration. CONTRAST:  94mL OMNIPAQUE IOHEXOL 300 MG/ML  SOLN COMPARISON:  Chest radiograph dated 06/30/2019 FINDINGS: Cardiovascular: Heart is normal in size.  No pericardial effusion. No evidence of thoracic aortic aneurysm. Atherosclerotic calcifications of the aortic arch. Three vessel coronary atherosclerosis. Postsurgical changes related to prior CABG. Mediastinum/Nodes: Small mediastinal lymph nodes, including a 13 mm right azygoesophageal recess node and a 12 mm inter lobar right lower lobe node (series 2/image 70), likely reactive. Visualized thyroid is  unremarkable. Lungs/Pleura: Subpleural scarring/rounded atelectasis in the left upper lobe (series 4/image 59). Rounded atelectasis in the left lower lobe (series 4/image 87). Small left pleural effusion with pleural thickening (series 2/image 118), favoring a chronic loculated pleural effusion (given the associated findings) over an empyema. Faint ground-glass opacities in the right upper and lower lobes (for example, series 4/image 47) suggest very mild edema or less likely infection. Mild right basilar opacity, likely atelectasis. Mild centrilobular and paraseptal emphysematous changes, right upper lobe predominant. No pneumothorax. Upper Abdomen: Visualized upper abdomen is grossly unremarkable. Musculoskeletal: Degenerative changes of the visualized thoracolumbar spine. Median sternotomy. IMPRESSION: Rounded atelectasis in the left upper and lower lobes, likely accounting for the radiographic abnormality. Small loculated left pleural effusion with pleural thickening, likely chronic. Empyema is considered unlikely given the associated findings. Faint ground-glass opacities in the right lung suggests mild edema or less likely infection. Aortic Atherosclerosis (ICD10-I70.0) and Emphysema (ICD10-J43.9). Electronically Signed   By: Julian Hy M.D.   On: 06/30/2019 21:49   DG CHEST PORT 1 VIEW  Result Date: 07/01/2019 CLINICAL DATA:  Shortness of breath EXAM: PORTABLE CHEST 1 VIEW COMPARISON:  June 30, 2019 chest radiograph and chest CT FINDINGS: There is new airspace opacity in the right upper and lower lung regions. Consolidation in the periphery of the left mid lung and left base regions remains with small left pleural effusion. There is mild cardiomegaly with pulmonary venous hypertension. No adenopathy. Patient status post median sternotomy. No pneumothorax. No bone lesions. IMPRESSION: New airspace consolidation on the right which may represent progression of pulmonary edema or pneumonia. Both  entities may be present concurrently. Irregular opacity persists on the left without change. There is a small left pleural effusion. Electronically Signed   By: Lowella Grip III M.D.   On: 07/01/2019 10:10   DG Chest Port 1 View  Result Date: 06/30/2019 CLINICAL DATA:  Dyspnea EXAM: PORTABLE CHEST 1 VIEW COMPARISON:  09/01/2015 FINDINGS: Post sternotomy changes. Probable trace right-sided pleural effusion. Small left loculated pleural effusion or pleural thickening. Irregular peripheral opacity in the left mid lung contiguous with the pleural surface. Consolidation at the left base. Stable cardiomediastinal silhouette. No pneumothorax. IMPRESSION: 1. Left loculated pleural effusion or pleural thickening with irregular opacity in the left mid lung extending to the pleural surface, suggest chest CT for further evaluation. There is also airspace disease at the left base 2. Probable trace right pleural effusion Electronically Signed   By: Donavan Foil M.D.   On: 06/30/2019 20:20   ECHOCARDIOGRAM LIMITED  Result Date: 07/01/2019    ECHOCARDIOGRAM LIMITED REPORT   Patient Name:   Benjamin Cordova Date of Exam: 07/01/2019 Medical Rec #:  [id number]  Height:       68.0 in Accession #:    [id number]      Weight:       245.4 lb Date of Birth:  07-23-1934       BSA:          2.229 m Patient Age:    45 years        BP:           102/74 mmHg Patient Gender: M               HR:           48 bpm. Exam Location:  [facility] Procedure: Intracardiac Opacification Agent and Limited Echo Indications:    Dyspnea 786.09 / R06.00  History:        Patient has prior history of Echocardiogram examinations, most                 recent 03/13/2019. CHF, CAD, Prior CABG, Arrythmias:Atrial                 Fibrillation, Signs/Symptoms:Chest Pain; Risk                 Factors:Hypertension, Former Smoker and Dyslipidemia. LV (left                 ventricular) mural thrombus.  Sonographer:    Leavy Cella RDCS (AE) Referring Phys:  [place]  1. Laminated mural thrombus seen in LV apex. Left ventricular ejection fraction, by estimation, is 25 to 30%. The left ventricle has severely decreased function. The left ventricle demonstrates global hypokinesis. The left ventricular internal cavity size was mildly dilated. Left ventricular diastolic parameters are consistent with Grade I diastolic dysfunction (impaired relaxation).  2. The mitral valve is grossly normal. Trivial mitral valve regurgitation.  3. The aortic valve is tricuspid. Aortic valve regurgitation is not visualized. No aortic stenosis is present.  4. There is normal pulmonary artery systolic pressure. FINDINGS  Left Ventricle: Laminated mural thrombus seen in LV apex. Left ventricular ejection fraction, by estimation, is 25 to 30%. The left ventricle has severely decreased function. The left ventricle demonstrates global hypokinesis. Definity contrast agent was given IV to delineate the left ventricular endocardial borders. The left ventricular internal cavity size was mildly dilated. There is no left ventricular hypertrophy. Normal left ventricular filling pressure.  LV Wall Scoring: The apex is akinetic. Right Ventricle: There is normal pulmonary artery systolic pressure. The tricuspid regurgitant velocity is 1.52 m/s, and with an assumed right atrial pressure of 10 mmHg, the estimated right ventricular systolic pressure is Q000111Q mmHg. Pericardium: There is no evidence of pericardial effusion. Mitral Valve: The mitral valve is grossly normal. Mild mitral annular calcification. Trivial mitral valve regurgitation. Tricuspid Valve: The tricuspid valve is grossly normal. Tricuspid valve regurgitation is trivial. Aortic Valve: The aortic valve is tricuspid. Aortic valve regurgitation is not visualized. No aortic stenosis is present. Moderate aortic valve annular calcification. Pulmonic Valve: The pulmonic valve was grossly normal. Pulmonic valve regurgitation is not  visualized. Aorta: The aortic root is normal in size and structure.  LEFT VENTRICLE PLAX 2D LVIDd:         5.80 cm  Diastology LVIDs:         5.06 cm  LV e' lateral:   5.03 cm/s LV PW:         0.95 cm  LV E/e' lateral: 7.7 LV IVS:        1.00 cm  LV e' medial:    5.48 cm/s LVOT diam:     2.30 cm  LV E/e' medial:  7.0 LV SV:         62 LV SV Index:   28 LVOT Area:     4.15 cm  LEFT ATRIUM         Index LA diam:    4.40 cm 1.97 cm/m  AORTIC VALVE LVOT Vmax:   66.80 cm/s LVOT Vmean:  46.800 cm/s LVOT VTI:    0.149 m  AORTA Ao Root diam: 3.40 cm MITRAL VALVE               TRICUSPID VALVE MV Area (PHT): 3.61 cm    TR Peak grad:   9.2 mmHg MV Decel Time: 210 msec    TR Vmax:        152.00 cm/s MV E velocity: 38.60 cm/s MV A velocity: 70.30 cm/s  SHUNTS MV E/A ratio:  0.55        Systemic VTI:  0.15 m                            Systemic Diam: 2.30 cm Kate Sable MD Electronically signed by Kate Sable MD Signature Date/Time: 07/01/2019/3:48:41 PM    Final     Cardiac Studies   Echocardiogram 07/01/19:  1. Laminated mural thrombus seen in LV apex. Left ventricular ejection  fraction, by estimation, is 25 to 30%. The left ventricle has severely  decreased function. The left ventricle demonstrates global hypokinesis.  The left ventricular internal cavity size was mildly dilated. Left ventricular diastolic parameters are consistent with Grade I diastolic dysfunction (impaired relaxation).  2. The mitral valve is grossly normal. Trivial mitral valve regurgitation.  3. The aortic valve is tricuspid. Aortic valve regurgitation is not  visualized. No aortic stenosis is present.  4. There is normal pulmonary artery systolic pressure.   Relevant CV Studies:  Cardiac Catheterization: 10/2014  Prox LAD lesion, 80% stenosed.  Mid LAD lesion, 100% stenosed.  1st Diag-1 lesion, 90% stenosed.  1st Diag-2 lesion, 100% stenosed.  1st Mrg lesion, 100% stenosed.  2nd Mrg lesion, 90%  stenosed.  Prox RCA to Mid RCA lesion, 90% stenosed.  Mid RCA to Dist RCA lesion, 100% stenosed.  Acute Mrg lesion, 90% stenosed.  SVG .  Origin lesion, before 1st Diag, 100% stenosed.  Origin lesion, 100% stenosed.  Origin lesion, 100% stenosed.  There is moderate to severe left ventricular systolic dysfunction.  1. Severe 3 vessel obstructive/occlusive CAD. 2. All grafts are occluded including SVG to diagonal/LAD, SVG to OM1, and SVG to RCA. 3. Moderate to severe LV dysfunction. 4. The LIMA is widely patent as was not used previously for bypass.  Recommendation: consider for redo CABG  Patient Profile     85 y.o. male with past medical history of CAD (s/p CABG in 1981, cath in 10/2014 showing occluded grafts --> redo CABG in LIMA-LAD and Left Radial-OM2), chronic combined systolic and diastolic CHF (EF [id number] in 2017, 40-50% in 03/2018, at 30-35% in 03/2019), history of LV apical thrombus, HTN, HLD and history of CVA (on Coumadin per PCP) who is being seen today for the evaluation of CHF at the request of Dr. Dyann Kief.   Assessment & Plan    1. Acute on chronic combined heart failure: LVEF 25-30% (previously 30-35% in December 2020). Was on BiPAP yesterday and today on nasal cannula with marked improvement in clinical status.  Currently on IV Lasix 40 mg BID with 600 cc recorded output and an additional 800 cc in bag. Given worsening renal function, will reduce IV Lasix to 40 mg daily. He is hypotensive but asymptomatic. Will continue Coreg and Entresto. Spironolactone currently on hold as K was 5.2 yesterday, 4.5 today.  2. CAD: S/p CABG in 1981, cath in 10/2014 showing occluded grafts --> redo CABG in LIMA-LAD and Left Radial-OM2. Repeat ischemic evaluation previously reviewed at office visits and he was not interested in an aggressive approach at that time.  LVEF 25-30% (previously 30-35% in December 2020). Denies anginal symptoms. Troponin elevation consistent with demand  ischemia rather than ACS.  Consider outpatient NST (if patient is willing) given drop in LVEF. Continue Coreg and statin. Not on ASA as he is on warfarin.  3. LV thrombus: Seen again by echo on 07/01/19. On warfarin.  4. HTN: BP actually low during my evaluation (hypertensive earlier) but he is asymptomatic. Would continue Coreg and Entresto if BP permits.  5. Hyperlipidemia: Continue simvastatin.  6. Acute kidney injury: Baseline creatinine 1-1.1, up to 1.4 on admission, 1.1 yesterday, up to 1.25 today. I will reduce IV Lasix to 40 mg daily.     For questions or updates, please contact [facility] Please consult [email or web address] for contact info under Cardiology/STEMI.      Signed, Kate Sable, MD  07/02/2019, 11:57 AM

## 2019-07-02 NOTE — Progress Notes (Signed)
ANTICOAGULATION CONSULT NOTE -   Pharmacy Consult for warfarin Indication: atrial fibrillation/ hx of LV thrombus  No Known Allergies  Patient Measurements: Height: 5\' 8"  (172.7 cm) Weight: 109.7 kg (241 lb 13.5 oz) IBW/kg (Calculated) : 68.4   Vital Signs: Temp: 98.2 F (36.8 C) (04/01 0720) Temp Source: Oral (04/01 0720) BP: 119/108 (04/01 0600) Pulse Rate: 47 (04/01 0720)  Labs: Recent Labs    06/30/19 1952 06/30/19 1952 06/30/19 2213 07/01/19 0534 07/01/19 0938 07/02/19 0439  HGB 12.1*  --   --  11.8*  --   --   HCT 37.8*  --   --  38.3*  --   --   PLT 135*  --   --  154  --   --   LABPROT 15.2  --   --  16.1*  --  15.6*  INR 1.2  --   --  1.3*  --  1.3*  CREATININE 1.40*  --   --  1.10  --  1.25*  TROPONINIHS 14   < > 14  --  21* 32*   < > = values in this interval not displayed.    Estimated Creatinine Clearance: 54.7 mL/min (A) (by C-G formula based on SCr of 1.25 mg/dL (H)).   Medical History: Past Medical History:  Diagnosis Date  . Arthritis   . Coronary artery disease    Multivessel s/p [facility]G [facility]  . Essential hypertension   . Hematuria   . History of blood transfusion 07/2015   Knee replacement HgB <9  . History of pneumonia   . History of stroke   . Ischemic cardiomyopathy   . Mixed hyperlipidemia   . Myocardial infarction ([place]) 1980  . Peripheral vascular disease ([place])   . 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 Skin cancer   . TIA (transient ischemic attack) 1989   Chronic coumadin  . UTI (lower urinary tract infection)     Medications:  Medications Prior to Admission  Medication Sig Dispense Refill Last Dose  . carvedilol (COREG) 12.5 MG tablet TAKE 1 TABLET TWICE DAILY  (STOP  ATENOLOL) (Patient taking differently: Take 12.5 mg by mouth 2 (two) times daily with a meal. ) 180 tablet 3 06/30/2019  . diphenhydramine-acetaminophen (TYLENOL PM) 25-500 MG TABS tablet Take 2 tablets by mouth at bedtime.     06/30/2019  . ferrous Q000111Q C-folic acid (TRINSICON / FOLTRIN) capsule Take 1 capsule by mouth at bedtime.   06/30/2019  . finasteride (PROSCAR) 5 MG tablet Take 5 mg by mouth daily.   [id number]  . folic acid (FOLVITE) Q000111Q MCG tablet Take 400 mcg by mouth daily.    06/30/2019  . furosemide (LASIX) 20 MG tablet Take 20 mg by mouth daily.    06/30/2019  . Krill Oil (OMEGA-3) 500 MG CAPS Take 1 capsule by mouth daily.   06/30/2019  . Multiple Vitamins-Minerals (CERTAVITE/ANTIOXIDANTS) TABS Take 1 tablet by mouth daily.    06/30/2019  . potassium chloride (K-DUR) 10 MEQ tablet Take 10 mEq by mouth daily. Take with Lasix (Furosemide)   06/30/2019  . sacubitril-valsartan (ENTRESTO) 49-51 MG Take 1 tablet by mouth 2 (two) times daily. 180 tablet 3 06/30/2019  . Saw Palmetto 500 MG CAPS Take 1 capsule by mouth 2 (two) times daily.   06/30/2019  . simvastatin (ZOCOR) 40 MG tablet Take 40 mg by mouth daily.   06/30/2019  . spironolactone (ALDACTONE) 25 MG tablet  Take 1 tablet (25 mg total) by mouth daily. 90 tablet 3 06/30/2019  . tamsulosin (FLOMAX) 0.4 MG CAPS capsule Take 0.4 mg by mouth daily.   06/30/2019  . vitamin C (ASCORBIC ACID) 500 MG tablet Take 1,000 mg by mouth 2 (two) times daily.    06/30/2019  . warfarin (COUMADIN) 5 MG tablet Take 1.5 tablets (7.5 mg total) by mouth daily at 6 PM. OR AS DIRECTED by MD. (Patient taking differently: Take 5-10 mg by mouth See admin instructions. Take 10 mg on Sun, Monday, Thurs.   Take 5 mg on Tues, Wed, Sat.) 60 tablet 0 06/30/2019  . B Complex-C-Folic Acid (SUPER B-COMPLEX/VIT C/FA) TABS Take 1 tablet by mouth daily.     06/30/2019 at Unknown time    Assessment: Pharmacy consulted to dose warfarin in patient with atrial fibrillation.  Current INR 1.3.  Home dose listed as 7.5 mg daily.  Last dose 3/30  Dose not given 3/31: patient was on BIPAP and made NPO- taking PO meds today  Goal of Therapy:  INR 2-3 Monitor platelets by anticoagulation protocol:  Yes   Plan:  Warfarin 10 mg x 1 dose. Monitor labs and s/s of bleeding.  Margot Ables, PharmD Clinical Pharmacist 07/02/2019 11:54 AM

## 2019-07-02 NOTE — Telephone Encounter (Signed)
Called patient. No answer. Left message to call back.  

## 2019-07-02 NOTE — Progress Notes (Signed)
PROGRESS NOTE    Benjamin Cordova  [id number] DOB: 1935/01/10 DOA: 06/30/2019 PCP: Celene Squibb, MD     Brief Narrative:  As per H&P written by Dr. Scherrie November on 06/30/19 HPI: Benjamin Cordova is a 85 y.o. male with medical history significant of ischemic cardiomyopathy, coronary artery disease, hypertension, hyperlipidemia, peripheral vascular disease, history of TIA who presented to the ER with increased weakness.  Patient states he has been having increasing weakness at home and today was significantly worse and had difficulty with ambulation and shortness of breath with minimal activity.  Has a history of ischemic cardiomyopathy with EF of 30 to 35% and is on Entresto at home.  Did see the cardiologist as outpatient on 06/18/2019 and was his Delene Loll was increased at that time.  He has a history of polio in the right lower extremity and in general has been very sedentary at home over the last several weeks.  Does use a walker for ambulation. Has not had any chest pain.  No increased lower extremity edema.  No fever, chills, cough, palpitations.  No nausea, vomiting, abdominal pain, diarrhea, dysuria.  No seizures, syncope, dizziness, lightheadedness. ED Course:  Vital Signs reviewed on presentation, significant for Temperature 97.7, heart rate 108, blood pressure 116/59, saturation 93% on room air. Labs reviewed, significant for sodium 137, potassium 5.0, BUN 42, creatinine 1.4, BNP 630, troponin negative x2, WBC count 6.6, hemoglobin 12.1, hematocrit 37, platelets 135, INR 1.2. Imaging personally Reviewed, CT chest with IV contrast shows small loculated left pleural effusion with pleural thickening, likely chronic.  Rounded atelectasis in the left upper and lower lobes, faint groundglass opacities in the right lung suggesting mild edema. EKG personally reviewed, shows sinus tachycardia, PVCs as well as PACs.   Assessment & Plan: 1-acute on chronic systolic and diastolic heart failure -Patient  with recent increase in his Entresto. -Ejection fraction in December 2020 of 30 to 35%.  Repeat echo demonstrating grade 1 diastolic dysfunction, ejection fraction 25 to 30%. -Cardiology service has been consulted and will follow further recommendations. -Continue IV diuresis, Lasix dose changed to 40 mg daily. -Close monitoring of electrolytes and renal function to be pursuit. -Continue daily weights, low-sodium diet and strict I's and O's. -Entresto dose was decreased secondary to worsening of his renal function and acute exacerbation of CHF. -Spironolactone remains on hold  2-acute respiratory failure with hypoxia and hypercapnia -Appears to be in the setting of CHF exacerbation as mentioned above -Follow response to diuresis -Continue Pulmicort along with Brovana and as needed duo nebs -Patient having difficulty speaking in full sentences. -Repeat chest x-ray, demonstrated vascular congestion -ABG: demonstrating PH of 7.174 and CO2 of 79.2, bicarb 22.3; drastic improvement after use of BiPAP and currently stable.  3-history of paroxysmal atrial fibrillation -Rate controlled and currently sinus -Continue monitoring on telemetry -Continue beta-blocker -Continue Coumadin per pharmacy.  4-hypertension -Blood pressure stable and well-controlled -Continue to follow vital signs. -Continue heart healthy diet.  5-acute physical deconditioning and weakness -Will follow physical therapy evaluation and recommendations once medically stable.  6-history of coronary artery disease -Patient status post CABG x2 -Complaining of diaphoresis and chest tightness currently. -EKG no demonstrating acute ischemic changes -On admission troponin x3 were negative. -Continue monitoring on telemetry -Repeat troponin elevated suggesting demand ischemia rather than ACS. -Continue beta-blocker and statins -no aspirin as patient chronically on coumadin  -Cardiology service on board and will follow further  recommendations (currently leaning towards outpatient nuclear stress test and continue medical management).Marland Kitchen  7-BPH -Continue Flomax and finasteride -Denies urinary retention symptoms.  8-acute kidney injury on chronic kidney disease a stage II -Appears to be associated with recent increase in his Entresto medication -Dose has been reduced -Spironolactone continue to be on hold  -After aggressive IV diuresis creatinine up to 1.25 -Lasix dosage adjusted per cardiology recommendation -Continue to follow renal function trend closely -At baseline creatinine 1-1.1 with a GFR above 50 -Patient advised to maintain adequate hydration.   DVT prophylaxis: Chronically on Coumadin. Code Status: Full code Family Communication: No family at bedside. Disposition Plan: Significant improvement in his breathing.  Continue adjusted dose of diuresis as recommended by cardiology service.  No further need of BiPAP anticipated coronary.  Transfer patient to telemetry bed.  Continue to follow daily weights, low-sodium diet and strict I's and O's.  Consultants:   Cardiology service  Procedures:   2D echo:  1. Laminated mural thrombus seen in LV apex. Left ventricular ejection  fraction, by estimation, is 25 to 30%. The left ventricle has severely  decreased function. The left ventricle demonstrates global hypokinesis.  The left ventricular internal cavity size was mildly dilated. Left ventricular diastolic parameters are consistent with Grade I diastolic dysfunction (impaired relaxation).  2. The mitral valve is grossly normal. Trivial mitral valve regurgitation.  3. The aortic valve is tricuspid. Aortic valve regurgitation is not  visualized. No aortic stenosis is present.  4. There is normal pulmonary artery systolic pressure.   See below for x-ray reports  Antimicrobials:  Anti-infectives (From admission, onward)   None       Subjective: No fever, no chest pain, no nausea, no vomiting.   Patient reports significant improvement in his breathing and is almost able to speak in full sentences. Mild orthopnea and SOB on exertion reported.  Objective: Vitals:   07/02/19 1500 07/02/19 1641 07/02/19 1700 07/02/19 1749  BP: (!) 86/55  (!) 84/54 (!) 101/36  Pulse: (!) 43   86  Resp: 18 (!) 21 18 (!) 26  Temp:  98 F (36.7 C)    TempSrc:  Oral    SpO2: 100%   98%  Weight:      Height:        Intake/Output Summary (Last 24 hours) at 07/02/2019 1826 Last data filed at 07/02/2019 1300 Gross per 24 hour  Intake 480 ml  Output 600 ml  Net -120 ml   Filed Weights   06/30/19 1812 07/01/19 0100 07/02/19 0500  Weight: 113.4 kg 111.3 kg 109.7 kg    Examination: General exam: Alert, awake, oriented x 3; reports feeling better, breathing easier and denying chest pain.  Still with some intermittent desaturation on exertion but demonstrating significant improvement.  No nausea, no vomiting. Respiratory system: Decreased breath sounds at the bases, no wheezing, no frank crackles. Cardiovascular system:RRR. No rubs, no gallops, no JVD. Gastrointestinal system: Abdomen is nondistended, soft and nontender. No organomegaly or masses felt. Normal bowel sounds heard. Central nervous system: Alert and oriented. No focal neurological deficits. Extremities: No cyanosis or clubbing; trace edema bilaterally. Skin: No rashes, no petechiae. Psychiatry: Judgement and insight appear normal. Mood & affect appropriate.    Data Reviewed: I have personally reviewed following labs and imaging studies  CBC: Recent Labs  Lab 06/30/19 1952 07/01/19 0534  WBC 6.6 9.6  NEUTROABS 5.4  --   HGB 12.1* 11.8*  HCT 37.8* 38.3*  MCV 95.2 96.5  PLT 135* [id number]   Basic Metabolic Panel: Recent Labs  Lab 06/30/19 1952 07/01/19  0534 07/02/19 0439  NA 137 138 142  K 5.0 5.2* 4.5  CL 104 103 103  CO2 25 27 26   GLUCOSE 119* 138* 86  BUN 42* 38* 44*  CREATININE 1.40* 1.10 1.25*  CALCIUM 9.0 8.9 9.1  MG 2.4   --  2.3   GFR: Estimated Creatinine Clearance: 54.7 mL/min (A) (by C-G formula based on SCr of 1.25 mg/dL (H)).  Coagulation Profile: Recent Labs  Lab 06/30/19 1952 07/01/19 0534 07/02/19 0439  INR 1.2 1.3* 1.3*   CBG: Recent Labs  Lab 07/01/19 0857  GLUCAP 276*   Urine analysis:    Component Value Date/Time   COLORURINE YELLOW 07/01/2019 0445   APPEARANCEUR CLOUDY (A) 07/01/2019 0445   LABSPEC 1.027 07/01/2019 0445   PHURINE 5.0 07/01/2019 0445   GLUCOSEU NEGATIVE 07/01/2019 0445   HGBUR SMALL (A) 07/01/2019 0445   BILIRUBINUR NEGATIVE 07/01/2019 0445   KETONESUR NEGATIVE 07/01/2019 0445   PROTEINUR NEGATIVE 07/01/2019 0445   UROBILINOGEN 1.0 10/07/2014 1638   NITRITE NEGATIVE 07/01/2019 0445   LEUKOCYTESUR LARGE (A) 07/01/2019 0445   Radiology Studies: CT Chest W Contrast  Result Date: 06/30/2019 CLINICAL DATA:  Abnormal chest radiograph, loculated pleural effusion EXAM: CT CHEST WITH CONTRAST TECHNIQUE: Multidetector CT imaging of the chest was performed during intravenous contrast administration. CONTRAST:  30mL OMNIPAQUE IOHEXOL 300 MG/ML  SOLN COMPARISON:  Chest radiograph dated 06/30/2019 FINDINGS: Cardiovascular: Heart is normal in size.  No pericardial effusion. No evidence of thoracic aortic aneurysm. Atherosclerotic calcifications of the aortic arch. Three vessel coronary atherosclerosis. Postsurgical changes related to prior CABG. Mediastinum/Nodes: Small mediastinal lymph nodes, including a 13 mm right azygoesophageal recess node and a 12 mm inter lobar right lower lobe node (series 2/image 70), likely reactive. Visualized thyroid is unremarkable. Lungs/Pleura: Subpleural scarring/rounded atelectasis in the left upper lobe (series 4/image 59). Rounded atelectasis in the left lower lobe (series 4/image 87). Small left pleural effusion with pleural thickening (series 2/image 118), favoring a chronic loculated pleural effusion (given the associated findings) over an  empyema. Faint ground-glass opacities in the right upper and lower lobes (for example, series 4/image 47) suggest very mild edema or less likely infection. Mild right basilar opacity, likely atelectasis. Mild centrilobular and paraseptal emphysematous changes, right upper lobe predominant. No pneumothorax. Upper Abdomen: Visualized upper abdomen is grossly unremarkable. Musculoskeletal: Degenerative changes of the visualized thoracolumbar spine. Median sternotomy. IMPRESSION: Rounded atelectasis in the left upper and lower lobes, likely accounting for the radiographic abnormality. Small loculated left pleural effusion with pleural thickening, likely chronic. Empyema is considered unlikely given the associated findings. Faint ground-glass opacities in the right lung suggests mild edema or less likely infection. Aortic Atherosclerosis (ICD10-I70.0) and Emphysema (ICD10-J43.9). Electronically Signed   By: Julian Hy M.D.   On: 06/30/2019 21:49   DG CHEST PORT 1 VIEW  Result Date: 07/01/2019 CLINICAL DATA:  Shortness of breath EXAM: PORTABLE CHEST 1 VIEW COMPARISON:  June 30, 2019 chest radiograph and chest CT FINDINGS: There is new airspace opacity in the right upper and lower lung regions. Consolidation in the periphery of the left mid lung and left base regions remains with small left pleural effusion. There is mild cardiomegaly with pulmonary venous hypertension. No adenopathy. Patient status post median sternotomy. No pneumothorax. No bone lesions. IMPRESSION: New airspace consolidation on the right which may represent progression of pulmonary edema or pneumonia. Both entities may be present concurrently. Irregular opacity persists on the left without change. There is a small left pleural effusion. Electronically  Signed   By: Lowella Grip III M.D.   On: 07/01/2019 10:10   DG Chest Port 1 View  Result Date: 06/30/2019 CLINICAL DATA:  Dyspnea EXAM: PORTABLE CHEST 1 VIEW COMPARISON:  09/01/2015  FINDINGS: Post sternotomy changes. Probable trace right-sided pleural effusion. Small left loculated pleural effusion or pleural thickening. Irregular peripheral opacity in the left mid lung contiguous with the pleural surface. Consolidation at the left base. Stable cardiomediastinal silhouette. No pneumothorax. IMPRESSION: 1. Left loculated pleural effusion or pleural thickening with irregular opacity in the left mid lung extending to the pleural surface, suggest chest CT for further evaluation. There is also airspace disease at the left base 2. Probable trace right pleural effusion Electronically Signed   By: Donavan Foil M.D.   On: 06/30/2019 20:20   ECHOCARDIOGRAM LIMITED  Result Date: 07/01/2019    ECHOCARDIOGRAM LIMITED REPORT   Patient Name:   Benjamin Cordova Date of Exam: 07/01/2019 Medical Rec #:  [id number]       Height:       68.0 in Accession #:    [id number]      Weight:       245.4 lb Date of Birth:  [birth year]       BSA:          2.229 m Patient Age:    49 years        BP:           102/74 mmHg Patient Gender: M               HR:           48 bpm. Exam Location:  [facility] Procedure: Intracardiac Opacification Agent and Limited Echo Indications:    Dyspnea 786.09 / R06.00  History:        Patient has prior history of Echocardiogram examinations, most                 recent 03/13/2019. CHF, CAD, Prior CABG, Arrythmias:Atrial                 Fibrillation, Signs/Symptoms:Chest Pain; Risk                 Factors:Hypertension, Former Smoker and Dyslipidemia. LV (left                 ventricular) mural thrombus.  Sonographer:    Leavy Cella RDCS (AE) Referring Phys: [place]  1. Laminated mural thrombus seen in LV apex. Left ventricular ejection fraction, by estimation, is 25 to 30%. The left ventricle has severely decreased function. The left ventricle demonstrates global hypokinesis. The left ventricular internal cavity size was mildly dilated. Left ventricular diastolic  parameters are consistent with Grade I diastolic dysfunction (impaired relaxation).  2. The mitral valve is grossly normal. Trivial mitral valve regurgitation.  3. The aortic valve is tricuspid. Aortic valve regurgitation is not visualized. No aortic stenosis is present.  4. There is normal pulmonary artery systolic pressure. FINDINGS  Left Ventricle: Laminated mural thrombus seen in LV apex. Left ventricular ejection fraction, by estimation, is 25 to 30%. The left ventricle has severely decreased function. The left ventricle demonstrates global hypokinesis. Definity contrast agent was given IV to delineate the left ventricular endocardial borders. The left ventricular internal cavity size was mildly dilated. There is no left ventricular hypertrophy. Normal left ventricular filling pressure.  LV Wall Scoring: The apex is akinetic. Right Ventricle: There is normal pulmonary artery systolic pressure. The tricuspid regurgitant  velocity is 1.52 m/s, and with an assumed right atrial pressure of 10 mmHg, the estimated right ventricular systolic pressure is [id number] mmHg. Pericardium: There is no evidence of pericardial effusion. Mitral Valve: The mitral valve is grossly normal. Mild mitral annular calcification. Trivial mitral valve regurgitation. Tricuspid Valve: The tricuspid valve is grossly normal. Tricuspid valve regurgitation is trivial. Aortic Valve: The aortic valve is tricuspid. Aortic valve regurgitation is not visualized. No aortic stenosis is present. Moderate aortic valve annular calcification. Pulmonic Valve: The pulmonic valve was grossly normal. Pulmonic valve regurgitation is not visualized. Aorta: The aortic root is normal in size and structure.  LEFT VENTRICLE PLAX 2D LVIDd:         5.80 cm  Diastology LVIDs:         5.06 cm  LV e' lateral:   5.03 cm/s LV PW:         0.95 cm  LV E/e' lateral: 7.7 LV IVS:        1.00 cm  LV e' medial:    5.48 cm/s LVOT diam:     2.30 cm  LV E/e' medial:  7.0 LV SV:          62 LV SV Index:   28 LVOT Area:     4.15 cm  LEFT ATRIUM         Index LA diam:    4.40 cm 1.97 cm/m  AORTIC VALVE LVOT Vmax:   66.80 cm/s LVOT Vmean:  46.800 cm/s LVOT VTI:    0.149 m  AORTA Ao Root diam: 3.40 cm MITRAL VALVE               TRICUSPID VALVE MV Area (PHT): 3.61 cm    TR Peak grad:   9.2 mmHg MV Decel Time: 210 msec    TR Vmax:        152.00 cm/s MV E velocity: 38.60 cm/s MV A velocity: 70.30 cm/s  SHUNTS MV E/A ratio:  0.55        Systemic VTI:  0.15 m                            Systemic Diam: 2.30 cm Kate Sable MD Electronically signed by Kate Sable MD Signature Date/Time: 07/01/2019/3:48:41 PM    Final     Scheduled Meds: . vitamin C  500 mg Oral BID  . budesonide (PULMICORT) nebulizer solution  0.5 mg Nebulization BID  . carvedilol  12.5 mg Oral BID WC  . Chlorhexidine Gluconate Cloth  6 each Topical Daily  . finasteride  5 mg Oral Daily  . folic acid  [id number] mcg Oral Daily  . [START ON 07/03/2019] furosemide  40 mg Intravenous Daily  . mouth rinse  15 mL Mouth Rinse BID  . multivitamin with minerals  1 tablet Oral Daily  . omega-3 acid ethyl esters  1 g Oral Daily  . pneumococcal 23 valent vaccine  0.5 mL Intramuscular Tomorrow-1000  . sacubitril-valsartan  1 tablet Oral BID  . simvastatin  40 mg Oral q1800  . sodium chloride flush  3 mL Intravenous Q12H  . tamsulosin  0.4 mg Oral Daily  . Warfarin - Pharmacist Dosing Inpatient   Does not apply q1600   Continuous Infusions: . sodium chloride       LOS: 1 day    Time spent: 35 minutes.    Barton Dubois, MD [facility] Pager [phone]   07/02/2019, 6:26 PM

## 2019-07-03 ENCOUNTER — Inpatient Hospital Stay (HOSPITAL_COMMUNITY): Payer: Medicare HMO

## 2019-07-03 DIAGNOSIS — R531 Weakness: Secondary | ICD-10-CM

## 2019-07-03 DIAGNOSIS — N1831 Chronic kidney disease, stage 3a: Secondary | ICD-10-CM

## 2019-07-03 LAB — BASIC METABOLIC PANEL
Anion gap: 9 (ref 5–15)
BUN: 46 mg/dL — ABNORMAL HIGH (ref 8–23)
CO2: 32 mmol/L (ref 22–32)
Calcium: 8.5 mg/dL — ABNORMAL LOW (ref 8.9–10.3)
Chloride: 97 mmol/L — ABNORMAL LOW (ref 98–111)
Creatinine, Ser: 1.07 mg/dL (ref 0.61–1.24)
GFR calc Af Amer: >60 mL/min (ref >60)
GFR calc non Af Amer: >60 mL/min (ref >60)
Glucose, Bld: 111 mg/dL — ABNORMAL HIGH (ref 70–99)
Potassium: 4.4 mmol/L (ref 3.5–5.1)
Sodium: 138 mmol/L (ref 135–145)

## 2019-07-03 LAB — PROTIME-INR
INR: 1.4 — ABNORMAL HIGH (ref 0.8–1.2)
Prothrombin Time: 16.8 seconds — ABNORMAL HIGH (ref 11.4–15.2)

## 2019-07-03 MED ORDER — FUROSEMIDE 20 MG PO TABS
20.0000 mg | ORAL_TABLET | Freq: Two times a day (BID) | ORAL | Status: DC
  Administered 2019-07-04: 20 mg via ORAL
  Filled 2019-07-03: qty 1

## 2019-07-03 MED ORDER — CARVEDILOL 3.125 MG PO TABS
6.2500 mg | ORAL_TABLET | Freq: Two times a day (BID) | ORAL | Status: DC
  Administered 2019-07-04: 6.25 mg via ORAL
  Filled 2019-07-03: qty 2

## 2019-07-03 MED ORDER — DICLOFENAC SODIUM 1 % EX GEL
2.0000 g | Freq: Three times a day (TID) | CUTANEOUS | Status: DC | PRN
  Administered 2019-07-03 – 2019-07-04 (×2): 2 g via TOPICAL
  Filled 2019-07-03: qty 100

## 2019-07-03 MED ORDER — WARFARIN SODIUM 5 MG PO TABS
10.0000 mg | ORAL_TABLET | Freq: Once | ORAL | Status: AC
  Administered 2019-07-03: 10 mg via ORAL
  Filled 2019-07-03: qty 2

## 2019-07-03 NOTE — Plan of Care (Signed)
  Problem: Activity: Goal: Risk for activity intolerance will decrease Outcome: Progressing   Problem: Nutrition: Goal: Adequate nutrition will be maintained Outcome: Progressing   Problem: Elimination: Goal: Will not experience complications related to urinary retention Outcome: Progressing Note: Patient is able to void spontaneously.

## 2019-07-03 NOTE — Progress Notes (Signed)
   07/03/19 1614  Vitals  BP (!) 70/50  BP Location Right Arm  BP Method Manual  Patient Position (if appropriate) Sitting  MEWS Score  MEWS Temp 0  MEWS Systolic 2  MEWS Pulse 0  MEWS RR 0  MEWS LOC 0  MEWS Score 2  MEWS Score Color Yellow  Provider Notification  Date of Provider Response 07/03/19  Time of Provider Response 1615   Patient BP was 102/64, Map 76; HR 43 at 0807. Dr Dyann Kief notified for clarification if entresto should be given, carvedilol was help per order parameters. MD [place] responded at 1124, per cardiology okay to given entresto. Delene Loll was given.   Routine vitals taken BP dinamap is 65/47 sitting, MAP 54. Dr Dyann Kief notified. Manual BP obtained 70/50 sitting. MD aware. No new orders at this time. Patient alert, oriented pleasant sitting in chair watching television. Rip Harbour, RN at bedside with Probation officer to evaluate patient.   Vital Signs MEWS/VS Documentation      07/03/2019 0727 07/03/2019 0807 07/03/2019 1556 07/03/2019 1614   MEWS Score:  -  1  3  2    MEWS Score Color:  -  Green  Yellow  Yellow   Resp:  -  18  18  -   Pulse:  -  (!) 43  81  -   BP:  -  102/64  (!) 65/47  (!) 70/50   Temp:  -  98 F (36.7 C)  97.8 F (36.6 C)  -   O2 Device:  Nasal Cannula  HFNC  HFNC  -   O2 Flow Rate (L/min):  3 L/min  3 L/min  2 L/min  -   Level of Consciousness:  -  Alert  Alert  -     Trudee Kuster 07/03/2019,4:31 PM   No new orders at this time.

## 2019-07-03 NOTE — Progress Notes (Signed)
Progress Note  Patient Name: Benjamin Cordova Date of Encounter: 07/03/2019  Primary Cardiologist: Rozann Lesches, MD   Subjective    Denies chest pain, palpitations, and shortness of breath.   Inpatient Medications    Scheduled Meds: . vitamin C  500 mg Oral BID  . budesonide (PULMICORT) nebulizer solution  0.5 mg Nebulization BID  . carvedilol  12.5 mg Oral BID WC  . Chlorhexidine Gluconate Cloth  6 each Topical Daily  . finasteride  5 mg Oral Daily  . folic acid  [id number] mcg Oral Daily  . furosemide  40 mg Intravenous Daily  . mouth rinse  15 mL Mouth Rinse BID  . multivitamin with minerals  1 tablet Oral Daily  . omega-3 acid ethyl esters  1 g Oral Daily  . pneumococcal 23 valent vaccine  0.5 mL Intramuscular Tomorrow-1000  . sacubitril-valsartan  1 tablet Oral BID  . simvastatin  40 mg Oral q1800  . sodium chloride flush  3 mL Intravenous Q12H  . tamsulosin  0.4 mg Oral Daily  . warfarin  10 mg Oral ONCE-1600  . Warfarin - Pharmacist Dosing Inpatient   Does not apply q1600   Continuous Infusions: . sodium chloride     PRN Meds: sodium chloride, acetaminophen **OR** acetaminophen, diphenhydrAMINE **AND** acetaminophen, alum & mag hydroxide-simeth, bisacodyl, ipratropium-albuterol, LORazepam, metoprolol tartrate, ondansetron **OR** ondansetron (ZOFRAN) IV, polyethylene glycol, sodium chloride flush, traMADol   Vital Signs    Vitals:   07/03/19 0037 07/03/19 0416 07/03/19 0727 07/03/19 0807  BP:  (!) 84/52  102/64  Pulse:  (!) 43  (!) 43  Resp:  16  18  Temp:  98 F (36.7 C)  98 F (36.7 C)  TempSrc:  Oral  Oral  SpO2: 99% 95% 96% 100%  Weight:      Height:        Intake/Output Summary (Last 24 hours) at 07/03/2019 0920 Last data filed at 07/03/2019 0810 Gross per 24 hour  Intake 600 ml  Output 700 ml  Net -100 ml   Filed Weights   06/30/19 1812 07/01/19 0100 07/02/19 0500  Weight: 113.4 kg 111.3 kg 109.7 kg    Telemetry    Sinus rhythm with frequent  PAC's and PVC's - Personally Reviewed   Physical Exam   GEN: No acute distress.   Neck: No JVD Cardiac: RRR with frequent premature contractions, no murmurs, rubs, or gallops.  Respiratory: Bibasilar crackles GI: Soft, nontender, non-distended  MS: Trace b/l LE edema; No deformity. Neuro:  Nonfocal  Psych: Normal affect   Labs    Chemistry Recent Labs  Lab 07/01/19 0534 07/02/19 0439 07/03/19 0514  NA 138 142 138  K 5.2* 4.5 4.4  CL 103 103 97*  CO2 27 26 32  GLUCOSE 138* 86 111*  BUN 38* 44* 46*  CREATININE 1.10 1.25* 1.07  CALCIUM 8.9 9.1 8.5*  GFRNONAA >60 53* >60  GFRAA >60 >60 >60  ANIONGAP 8 13 9      Hematology Recent Labs  Lab 06/30/19 1952 07/01/19 0534  WBC 6.6 9.6  RBC 3.97* 3.97*  HGB 12.1* 11.8*  HCT 37.8* 38.3*  MCV 95.2 96.5  MCH 30.5 29.7  MCHC 32.0 30.8  RDW 13.6 13.7  PLT 135* 154    Cardiac EnzymesNo results for input(s): TROPONINI in the last 168 hours. No results for input(s): TROPIPOC in the last 168 hours.   BNP Recent Labs  Lab 06/30/19 2000  BNP 630.0*     DDimer  No results for input(s): DDIMER in the last 168 hours.   Radiology    DG CHEST PORT 1 VIEW  Result Date: 07/01/2019 CLINICAL DATA:  Shortness of breath EXAM: PORTABLE CHEST 1 VIEW COMPARISON:  June 30, 2019 chest radiograph and chest CT FINDINGS: There is new airspace opacity in the right upper and lower lung regions. Consolidation in the periphery of the left mid lung and left base regions remains with small left pleural effusion. There is mild cardiomegaly with pulmonary venous hypertension. No adenopathy. Patient status post median sternotomy. No pneumothorax. No bone lesions. IMPRESSION: New airspace consolidation on the right which may represent progression of pulmonary edema or pneumonia. Both entities may be present concurrently. Irregular opacity persists on the left without change. There is a small left pleural effusion. Electronically Signed   By: Lowella Grip III M.D.   On: 07/01/2019 10:10   ECHOCARDIOGRAM LIMITED  Result Date: 07/01/2019    ECHOCARDIOGRAM LIMITED REPORT   Patient Name:   Benjamin Cordova Date of Exam: 07/01/2019 Medical Rec #:  [id number]       Height:       68.0 in Accession #:    [id number]      Weight:       245.4 lb Date of Birth:  [birth year]       BSA:          2.229 m Patient Age:    85 years        BP:           102/74 mmHg Patient Gender: M               HR:           48 bpm. Exam Location:  [facility] Procedure: Intracardiac Opacification Agent and Limited Echo Indications:    Dyspnea 786.09 / R06.00  History:        Patient has prior history of Echocardiogram examinations, most                 recent 03/13/2019. CHF, CAD, Prior CABG, Arrythmias:Atrial                 Fibrillation, Signs/Symptoms:Chest Pain; Risk                 Factors:Hypertension, Former Smoker and Dyslipidemia. LV (left                 ventricular) mural thrombus.  Sonographer:    Leavy Cella RDCS (AE) Referring Phys: [place]  1. Laminated mural thrombus seen in LV apex. Left ventricular ejection fraction, by estimation, is 25 to 30%. The left ventricle has severely decreased function. The left ventricle demonstrates global hypokinesis. The left ventricular internal cavity size was mildly dilated. Left ventricular diastolic parameters are consistent with Grade I diastolic dysfunction (impaired relaxation).  2. The mitral valve is grossly normal. Trivial mitral valve regurgitation.  3. The aortic valve is tricuspid. Aortic valve regurgitation is not visualized. No aortic stenosis is present.  4. There is normal pulmonary artery systolic pressure. FINDINGS  Left Ventricle: Laminated mural thrombus seen in LV apex. Left ventricular ejection fraction, by estimation, is 25 to 30%. The left ventricle has severely decreased function. The left ventricle demonstrates global hypokinesis. Definity contrast agent was given IV to delineate the left  ventricular endocardial borders. The left ventricular internal cavity size was mildly dilated. There is no left ventricular hypertrophy. Normal left ventricular filling pressure.  LV Wall  Scoring: The apex is akinetic. Right Ventricle: There is normal pulmonary artery systolic pressure. The tricuspid regurgitant velocity is 1.52 m/s, and with an assumed right atrial pressure of 10 mmHg, the estimated right ventricular systolic pressure is Q000111Q mmHg. Pericardium: There is no evidence of pericardial effusion. Mitral Valve: The mitral valve is grossly normal. Mild mitral annular calcification. Trivial mitral valve regurgitation. Tricuspid Valve: The tricuspid valve is grossly normal. Tricuspid valve regurgitation is trivial. Aortic Valve: The aortic valve is tricuspid. Aortic valve regurgitation is not visualized. No aortic stenosis is present. Moderate aortic valve annular calcification. Pulmonic Valve: The pulmonic valve was grossly normal. Pulmonic valve regurgitation is not visualized. Aorta: The aortic root is normal in size and structure.  LEFT VENTRICLE PLAX 2D LVIDd:         5.80 cm  Diastology LVIDs:         5.06 cm  LV e' lateral:   5.03 cm/s LV PW:         0.95 cm  LV E/e' lateral: 7.7 LV IVS:        1.00 cm  LV e' medial:    5.48 cm/s LVOT diam:     2.30 cm  LV E/e' medial:  7.0 LV SV:         62 LV SV Index:   28 LVOT Area:     4.15 cm  LEFT ATRIUM         Index LA diam:    4.40 cm 1.97 cm/m  AORTIC VALVE LVOT Vmax:   66.80 cm/s LVOT Vmean:  46.800 cm/s LVOT VTI:    0.149 m  AORTA Ao Root diam: 3.40 cm MITRAL VALVE               TRICUSPID VALVE MV Area (PHT): 3.61 cm    TR Peak grad:   9.2 mmHg MV Decel Time: 210 msec    TR Vmax:        152.00 cm/s MV E velocity: 38.60 cm/s MV A velocity: 70.30 cm/s  SHUNTS MV E/A ratio:  0.55        Systemic VTI:  0.15 m                            Systemic Diam: 2.30 cm Kate Sable MD Electronically signed by Kate Sable MD Signature Date/Time:  07/01/2019/3:48:41 PM    Final     Cardiac Studies   Echocardiogram 07/01/19:  1. Laminated mural thrombus seen in LV apex. Left ventricular ejection  fraction, by estimation, is 25 to 30%. The left ventricle has severely  decreased function. The left ventricle demonstrates global hypokinesis.  The left ventricular internal cavity size was mildly dilated. Left ventricular diastolic parameters are consistent with Grade I diastolic dysfunction (impaired relaxation).  2. The mitral valve is grossly normal. Trivial mitral valve regurgitation.  3. The aortic valve is tricuspid. Aortic valve regurgitation is not  visualized. No aortic stenosis is present.  4. There is normal pulmonary artery systolic pressure.   Relevant CV Studies:  Cardiac Catheterization: 10/2014  Prox LAD lesion, 80% stenosed.  Mid LAD lesion, 100% stenosed.  1st Diag-1 lesion, 90% stenosed.  1st Diag-2 lesion, 100% stenosed.  1st Mrg lesion, 100% stenosed.  2nd Mrg lesion, 90% stenosed.  Prox RCA to Mid RCA lesion, 90% stenosed.  Mid RCA to Dist RCA lesion, 100% stenosed.  Acute Mrg lesion, 90% stenosed.  SVG .  Origin lesion, before 1st  Diag, 100% stenosed.  Origin lesion, 100% stenosed.  Origin lesion, 100% stenosed.  There is moderate to severe left ventricular systolic dysfunction.  1. Severe 3 vessel obstructive/occlusive CAD. 2. All grafts are occluded including SVG to diagonal/LAD, SVG to OM1, and SVG to RCA. 3. Moderate to severe LV dysfunction. 4. The LIMA is widely patent as was not used previously for bypass.  Recommendation: consider for redo CABG  Patient Profile     85 y.o. male with past medical history of CAD (s/p CABG in 1981, cath in 10/2014 showing occluded grafts --> redo CABG in LIMA-LAD and Left Radial-OM2), chronic combined systolic and diastolic CHF (EF [id number] in 2017, 40-50% in 03/2018, at 30-35% in 03/2019), history of LV apical thrombus, HTN, HLD and history of  CVA (on Coumadin per PCP) who is being seen today for the evaluation of CHF at the request of Dr. Dyann Kief.   Assessment & Plan    1. Acute on chronic combined heart failure: LVEF 25-30% (previously 30-35% in December 2020). Currently on IV Lasix 40 mg daily with minimal recorded output. Wt is 241.8 lbs and he weighed 242 lbs in the office in December 2020. He has bibasilar rales. I will obtain a chest xray. Creatinine improved to 1.07. Will continue Coreg and Entresto. Spironolactone currently on hold as K was 5.2 on 3/31, 4.4 today. Pending chest xray results, he may be able to be switched to oral Lasix 40 mg BID on 4/3 with close monitoring of renal function in outpatient setting.  2. CAD: S/p CABG in 1981, cath in 10/2014 showing occluded grafts --> redo CABG in LIMA-LAD and Left Radial-OM2. Repeat ischemic evaluation previously reviewed at office visits and he was not interested in an aggressive approach at that time.  LVEF 25-30% (previously 30-35% in December 2020). Denies anginal symptoms. Troponin elevation consistent with demand ischemia rather than ACS.  Consider outpatient NST (if patient is willing) given drop in LVEF. Continue Coreg and statin. Not on ASA as he is on warfarin.  3. LV thrombus: Seen again by echo on 07/01/19. On warfarin.  4. HTN: BP low normal and he is asymptomatic. Would continue Coreg and Entresto as BP permits.  5. Hyperlipidemia: Continue simvastatin.  6. Acute kidney injury: Baseline creatinine 1-1.1, up to 1.4 on admission, 1.07 today. IV Lasix reduced to 40 mg daily on 4/1.    For questions or updates, please contact [place] Please consult [email or web address] for contact info under Cardiology/STEMI.      Signed, Kate Sable, MD  07/03/2019, 9:20 AM

## 2019-07-03 NOTE — Progress Notes (Signed)
ANTICOAGULATION CONSULT NOTE -   Pharmacy Consult for warfarin Indication: atrial fibrillation/ hx of LV thrombus  No Known Allergies  Patient Measurements: Height: 5\' 8"  (172.7 cm) Weight: 109.7 kg (241 lb 13.5 oz) IBW/kg (Calculated) : 68.4  Vital Signs: Temp: 98 F (36.7 C) (04/02 0807) Temp Source: Oral (04/02 0807) BP: 102/64 (04/02 0807) Pulse Rate: 43 (04/02 0807)  Labs: Recent Labs    06/30/19 1952 06/30/19 1952 06/30/19 2213 07/01/19 0534 07/01/19 0938 07/02/19 0439 07/03/19 0514  HGB 12.1*  --   --  11.8*  --   --   --   HCT 37.8*  --   --  38.3*  --   --   --   PLT 135*  --   --  154  --   --   --   LABPROT 15.2   < >  --  16.1*  --  15.6* 16.8*  INR 1.2   < >  --  1.3*  --  1.3* 1.4*  CREATININE 1.40*   < >  --  1.10  --  1.25* 1.07  TROPONINIHS 14   < > 14  --  21* 32*  --    < > = values in this interval not displayed.    Estimated Creatinine Clearance: 63.9 mL/min (by C-G formula based on SCr of 1.07 mg/dL).   Medical History: Past Medical History:  Diagnosis Date  . Arthritis   . Coronary artery disease    Multivessel s/p CABG [facility] at [facility]  . Essential hypertension   . Hematuria   . History of blood transfusion 07/2015   Knee replacement HgB <9  . History of pneumonia   . History of stroke   . Ischemic cardiomyopathy   . Mixed hyperlipidemia   . Myocardial infarction ([place]) 1980  . Peripheral vascular disease ([place])   . 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 Skin cancer   . TIA (transient ischemic attack) 1989   Chronic coumadin  . UTI (lower urinary tract infection)     Medications:  Medications Prior to Admission  Medication Sig Dispense Refill Last Dose  . carvedilol (COREG) 12.5 MG tablet TAKE 1 TABLET TWICE DAILY  (STOP  ATENOLOL) (Patient taking differently: Take 12.5 mg by mouth 2 (two) times daily with a meal. ) 180 tablet 3 06/30/2019  . diphenhydramine-acetaminophen (TYLENOL PM) 25-500 MG TABS  tablet Take 2 tablets by mouth at bedtime.    06/30/2019  . ferrous Q000111Q C-folic acid (TRINSICON / FOLTRIN) capsule Take 1 capsule by mouth at bedtime.   06/30/2019  . finasteride (PROSCAR) 5 MG tablet Take 5 mg by mouth daily.   [id number]  . folic acid (FOLVITE) Q000111Q MCG tablet Take 400 mcg by mouth daily.    06/30/2019  . furosemide (LASIX) 20 MG tablet Take 20 mg by mouth daily.    06/30/2019  . Krill Oil (OMEGA-3) 500 MG CAPS Take 1 capsule by mouth daily.   06/30/2019  . Multiple Vitamins-Minerals (CERTAVITE/ANTIOXIDANTS) TABS Take 1 tablet by mouth daily.    06/30/2019  . potassium chloride (K-DUR) 10 MEQ tablet Take 10 mEq by mouth daily. Take with Lasix (Furosemide)   06/30/2019  . sacubitril-valsartan (ENTRESTO) 49-51 MG Take 1 tablet by mouth 2 (two) times daily. 180 tablet 3 06/30/2019  . Saw Palmetto 500 MG CAPS Take 1 capsule by mouth 2 (two) times daily.   06/30/2019  . simvastatin (ZOCOR) 40 MG  tablet Take 40 mg by mouth daily.   06/30/2019  . spironolactone (ALDACTONE) 25 MG tablet Take 1 tablet (25 mg total) by mouth daily. 90 tablet 3 06/30/2019  . tamsulosin (FLOMAX) 0.4 MG CAPS capsule Take 0.4 mg by mouth daily.   06/30/2019  . vitamin C (ASCORBIC ACID) 500 MG tablet Take 1,000 mg by mouth 2 (two) times daily.    06/30/2019  . warfarin (COUMADIN) 5 MG tablet Take 1.5 tablets (7.5 mg total) by mouth daily at 6 PM. OR AS DIRECTED by MD. (Patient taking differently: Take 5-10 mg by mouth See admin instructions. Take 10 mg on Sun, Monday, Thurs.   Take 5 mg on Tues, Wed, Sat.) 60 tablet 0 06/30/2019  . B Complex-C-Folic Acid (SUPER B-COMPLEX/VIT C/FA) TABS Take 1 tablet by mouth daily.     06/30/2019 at Unknown time    Assessment: Pharmacy consulted to dose warfarin in patient with atrial fibrillation.  Current INR 1.3>> 1.07.  Home dose listed as 7.5 mg daily.  Dose not given 3/31: patient was on BIPAP and made NPO- taking PO meds today  Goal of Therapy:  INR 2-3 Monitor  platelets by anticoagulation protocol: Yes   Plan:  Warfarin 10mg  x 1 dose. Monitor labs and s/s of bleeding. Daily PT-INR  Isac Sarna, BS [place], [place] Clinical Pharmacist Pager [phone] 07/03/2019 8:28 AM

## 2019-07-03 NOTE — Progress Notes (Addendum)
PROGRESS NOTE    Benjamin Cordova  [id number] DOB: Aug 15, 1934 DOA: 06/30/2019 PCP: Celene Squibb, MD     Brief Narrative:  As per H&P written by Dr. Scherrie November on 06/30/19 HPI: Benjamin Cordova is a 85 y.o. male with medical history significant of ischemic cardiomyopathy, coronary artery disease, hypertension, hyperlipidemia, peripheral vascular disease, history of TIA who presented to the ER with increased weakness.  Patient states he has been having increasing weakness at home and today was significantly worse and had difficulty with ambulation and shortness of breath with minimal activity.  Has a history of ischemic cardiomyopathy with EF of 30 to 35% and is on Entresto at home.  Did see the cardiologist as outpatient on 06/18/2019 and was his Delene Loll was increased at that time.  He has a history of polio in the right lower extremity and in general has been very sedentary at home over the last several weeks.  Does use a walker for ambulation. Has not had any chest pain.  No increased lower extremity edema.  No fever, chills, cough, palpitations.  No nausea, vomiting, abdominal pain, diarrhea, dysuria.  No seizures, syncope, dizziness, lightheadedness. ED Course:  Vital Signs reviewed on presentation, significant for Temperature 97.7, heart rate 108, blood pressure 116/59, saturation 93% on room air. Labs reviewed, significant for sodium 137, potassium 5.0, BUN 42, creatinine 1.4, BNP 630, troponin negative x2, WBC count 6.6, hemoglobin 12.1, hematocrit 37, platelets 135, INR 1.2. Imaging personally Reviewed, CT chest with IV contrast shows small loculated left pleural effusion with pleural thickening, likely chronic.  Rounded atelectasis in the left upper and lower lobes, faint groundglass opacities in the right lung suggesting mild edema. EKG personally reviewed, shows sinus tachycardia, PVCs as well as PACs.   Assessment & Plan: 1-acute on chronic systolic and diastolic heart failure -Patient  with recent increase in his Entresto. -Ejection fraction in December 2020 of 30 to 35%.  Repeat echo demonstrating grade 1 diastolic dysfunction, ejection fraction 25 to 30%. -Cardiology service has been consulted and will follow further recommendations. -Continue IV diuresis for today; with anticipated transition to oral diuretics in a.m. -Continue close monitoring of electrolytes and renal function to be pursuit. -Continue daily weights, low-sodium diet and strict I's and O's. -Entresto dose was decreased secondary to worsening of his renal function and acute exacerbation of CHF. -Spironolactone remains on hold  2-acute respiratory failure with hypoxia and hypercapnia -Appears to be in the setting of CHF exacerbation as mentioned above -Continue to follow response to diuresis -Continue Pulmicort along with Brovana and as needed duo nebs -Patient having difficulty speaking in full sentences. -Repeat chest x-ray, demonstrated improved in vascular congestion/interstitial edema; still having some abnormalities at the left lower base.  Right lung is clear.  3-history of paroxysmal atrial fibrillation -Rate controlled and currently sinus -Continue monitoring on telemetry -Continue beta-blocker -Continue Coumadin per pharmacy.  4-hypertension -Blood pressure stable and well-controlled -Continue to follow vital signs. -Continue heart healthy diet.  5-acute physical deconditioning and weakness -Will follow physical therapy evaluation and recommendations once medically stable.  6-history of coronary artery disease -Patient status post CABG x2 -Complaining of diaphoresis and chest tightness currently. -EKG no demonstrating acute ischemic changes -On admission troponin x3 were negative. -Continue monitoring on telemetry -Repeat troponin elevated suggesting demand ischemia rather than ACS. -Continue beta-blocker and statins -no aspirin as patient chronically on coumadin  -Cardiology  service on board and will follow further recommendations (currently leaning towards outpatient nuclear stress test and  continue medical management).Marland Kitchen  7-BPH -Continue Flomax and finasteride -Denies urinary retention symptoms.  8-acute kidney injury on chronic kidney disease a stage IIIa -Appears to be associated with recent increase in his Entresto medication -Dose has been reduced -Spironolactone continue to be on hold  -After aggressive IV diuresis creatinine peaked to 1.25; now down to 1.05 -Lasix dosage adjusted per cardiology recommendation -Continue to follow renal function trend closely -At baseline creatinine 1-1.1 with a GFR above 50 -Patient advised to maintain adequate hydration.   DVT prophylaxis: Chronically on Coumadin. Code Status: Full code Family Communication: No family at bedside. Disposition Plan: Significant improvement in his breathing.  Continue adjusted dose of diuresis as recommended by cardiology service.  No further need of BiPAP anticipated coronary.  Transfer patient to telemetry bed.  Continue to follow daily weights, low-sodium diet and strict I's and O's.  Consultants:   Cardiology service  Procedures:   2D echo:  1. Laminated mural thrombus seen in LV apex. Left ventricular ejection  fraction, by estimation, is 25 to 30%. The left ventricle has severely  decreased function. The left ventricle demonstrates global hypokinesis.  The left ventricular internal cavity size was mildly dilated. Left ventricular diastolic parameters are consistent with Grade I diastolic dysfunction (impaired relaxation).  2. The mitral valve is grossly normal. Trivial mitral valve regurgitation.  3. The aortic valve is tricuspid. Aortic valve regurgitation is not  visualized. No aortic stenosis is present.  4. There is normal pulmonary artery systolic pressure.   See below for x-ray reports  Antimicrobials:  Anti-infectives (From admission, onward)   None        Subjective: No fever, no chest pain, no nausea, no vomiting.  Still with mild shortness of breath on exertion; but overall expressing significant improvement.  No orthopnea.  Objective: Vitals:   07/03/19 0037 07/03/19 0416 07/03/19 0727 07/03/19 0807  BP:  (!) 84/52  102/64  Pulse:  (!) 43  (!) 43  Resp:  16  18  Temp:  98 F (36.7 C)  98 F (36.7 C)  TempSrc:  Oral  Oral  SpO2: 99% 95% 96% 100%  Weight:      Height:        Intake/Output Summary (Last 24 hours) at 07/03/2019 1345 Last data filed at 07/03/2019 1228 Gross per 24 hour  Intake 840 ml  Output 1250 ml  Net -410 ml   Filed Weights   06/30/19 1812 07/01/19 0100 07/02/19 0500  Weight: 113.4 kg 111.3 kg 109.7 kg    Examination: General exam: Alert, awake, oriented x 3; sitting at the edge of the bed, denying chest pain, palpitations or orthopnea.  Still having some lower extremity swelling (patient feels they are close to his baseline) and he is having mild shortness of breath on exertion.  Patient is afebrile. Respiratory system: Fine crackles bibasilarly; no wheezing, no using accessory muscles.  Normal respiratory effort. Cardiovascular system:Rate controlled, no rubs, no gallops, no JVD. Gastrointestinal system: Abdomen is nondistended, soft and nontender. No organomegaly or masses felt. Normal bowel sounds heard. Central nervous system: Alert and oriented. No focal neurological deficits. Extremities: No trace edema bilaterally (left more than right) Skin: No rashes, lesions or ulcers Psychiatry: Judgement and insight appear normal. Mood & affect appropriate.    Data Reviewed: I have personally reviewed following labs and imaging studies  CBC: Recent Labs  Lab 06/30/19 1952 07/01/19 0534  WBC 6.6 9.6  NEUTROABS 5.4  --   HGB 12.1* 11.8*  HCT  37.8* 38.3*  MCV 95.2 96.5  PLT 135* [id number]   Basic Metabolic Panel: Recent Labs  Lab 06/30/19 1952 07/01/19 0534 07/02/19 0439 07/03/19 0514  NA 137 138  142 138  K 5.0 5.2* 4.5 4.4  CL 104 103 103 97*  CO2 25 27 26  32  GLUCOSE 119* 138* 86 111*  BUN 42* 38* 44* 46*  CREATININE 1.40* 1.10 1.25* 1.07  CALCIUM 9.0 8.9 9.1 8.5*  MG 2.4  --  2.3  --    GFR: Estimated Creatinine Clearance: 63.9 mL/min (by C-G formula based on SCr of 1.07 mg/dL).  Coagulation Profile: Recent Labs  Lab 06/30/19 1952 07/01/19 0534 07/02/19 0439 07/03/19 0514  INR 1.2 1.3* 1.3* 1.4*   CBG: Recent Labs  Lab 07/01/19 0857  GLUCAP 276*   Urine analysis:    Component Value Date/Time   COLORURINE YELLOW 07/01/2019 0445   APPEARANCEUR CLOUDY (A) 07/01/2019 0445   LABSPEC 1.027 07/01/2019 0445   PHURINE 5.0 07/01/2019 0445   GLUCOSEU NEGATIVE 07/01/2019 0445   HGBUR SMALL (A) 07/01/2019 0445   BILIRUBINUR NEGATIVE 07/01/2019 0445   KETONESUR NEGATIVE 07/01/2019 0445   PROTEINUR NEGATIVE 07/01/2019 0445   UROBILINOGEN 1.0 10/07/2014 1638   NITRITE NEGATIVE 07/01/2019 0445   LEUKOCYTESUR LARGE (A) 07/01/2019 0445   Radiology Studies: DG Chest Port 1 View  Result Date: 07/03/2019 CLINICAL DATA:  Congestive heart failure EXAM: PORTABLE CHEST 1 VIEW COMPARISON:  July 01, 2019 FINDINGS: There has been significant resolution of alveolar edema compared to recent study. There is loculated pleural effusion on the left with apparent scarring and atelectatic change in the left mid lung. Consolidation in the left lung base may represent residual edema, although superimposed pneumonia must be of concern. Right lung is now essentially clear. There is cardiomegaly with pulmonary vascularity within normal limits. Status post median sternotomy. No adenopathy. No bone lesions. IMPRESSION: Interval significant resolution of alveolar edema since 2 days prior. There remains loculated left pleural effusion with scarring/atelectasis in the left mid lung and consolidation in the left base. Question pneumonia versus residual edema left base. Both entities may be present  concurrently. Right lung now clear. Stable cardiac silhouette. No adenopathy. Electronically Signed   By: Lowella Grip III M.D.   On: 07/03/2019 10:13   ECHOCARDIOGRAM LIMITED  Result Date: 07/01/2019    ECHOCARDIOGRAM LIMITED REPORT   Patient Name:   Benjamin Cordova Date of Exam: 07/01/2019 Medical Rec #:  [id number]       Height:       68.0 in Accession #:    [id number]      Weight:       245.4 lb Date of Birth:  09-Mar-1935       BSA:          2.229 m Patient Age:    27 years        BP:           102/74 mmHg Patient Gender: M               HR:           48 bpm. Exam Location:  [facility] Procedure: Intracardiac Opacification Agent and Limited Echo Indications:    Dyspnea 786.09 / R06.00  History:        Patient has prior history of Echocardiogram examinations, most                 recent 03/13/2019. CHF, CAD, Prior CABG, Arrythmias:Atrial  Fibrillation, Signs/Symptoms:Chest Pain; Risk                 Factors:Hypertension, Former Smoker and Dyslipidemia. LV (left                 ventricular) mural thrombus.  Sonographer:    Leavy Cella RDCS (AE) Referring Phys: [place]  1. Laminated mural thrombus seen in LV apex. Left ventricular ejection fraction, by estimation, is 25 to 30%. The left ventricle has severely decreased function. The left ventricle demonstrates global hypokinesis. The left ventricular internal cavity size was mildly dilated. Left ventricular diastolic parameters are consistent with Grade I diastolic dysfunction (impaired relaxation).  2. The mitral valve is grossly normal. Trivial mitral valve regurgitation.  3. The aortic valve is tricuspid. Aortic valve regurgitation is not visualized. No aortic stenosis is present.  4. There is normal pulmonary artery systolic pressure. FINDINGS  Left Ventricle: Laminated mural thrombus seen in LV apex. Left ventricular ejection fraction, by estimation, is 25 to 30%. The left ventricle has severely decreased  function. The left ventricle demonstrates global hypokinesis. Definity contrast agent was given IV to delineate the left ventricular endocardial borders. The left ventricular internal cavity size was mildly dilated. There is no left ventricular hypertrophy. Normal left ventricular filling pressure.  LV Wall Scoring: The apex is akinetic. Right Ventricle: There is normal pulmonary artery systolic pressure. The tricuspid regurgitant velocity is 1.52 m/s, and with an assumed right atrial pressure of 10 mmHg, the estimated right ventricular systolic pressure is [id number] mmHg. Pericardium: There is no evidence of pericardial effusion. Mitral Valve: The mitral valve is grossly normal. Mild mitral annular calcification. Trivial mitral valve regurgitation. Tricuspid Valve: The tricuspid valve is grossly normal. Tricuspid valve regurgitation is trivial. Aortic Valve: The aortic valve is tricuspid. Aortic valve regurgitation is not visualized. No aortic stenosis is present. Moderate aortic valve annular calcification. Pulmonic Valve: The pulmonic valve was grossly normal. Pulmonic valve regurgitation is not visualized. Aorta: The aortic root is normal in size and structure.  LEFT VENTRICLE PLAX 2D LVIDd:         5.80 cm  Diastology LVIDs:         5.06 cm  LV e' lateral:   5.03 cm/s LV PW:         0.95 cm  LV E/e' lateral: 7.7 LV IVS:        1.00 cm  LV e' medial:    5.48 cm/s LVOT diam:     2.30 cm  LV E/e' medial:  7.0 LV SV:         62 LV SV Index:   28 LVOT Area:     4.15 cm  LEFT ATRIUM         Index LA diam:    4.40 cm 1.97 cm/m  AORTIC VALVE LVOT Vmax:   66.80 cm/s LVOT Vmean:  46.800 cm/s LVOT VTI:    0.149 m  AORTA Ao Root diam: 3.40 cm MITRAL VALVE               TRICUSPID VALVE MV Area (PHT): 3.61 cm    TR Peak grad:   9.2 mmHg MV Decel Time: 210 msec    TR Vmax:        152.00 cm/s MV E velocity: 38.60 cm/s MV A velocity: 70.30 cm/s  SHUNTS MV E/A ratio:  0.55        Systemic VTI:  0.15 m  Systemic Diam: 2.30 cm Kate Sable MD Electronically signed by Kate Sable MD Signature Date/Time: 07/01/2019/3:48:41 PM    Final     Scheduled Meds: . vitamin C  500 mg Oral BID  . budesonide (PULMICORT) nebulizer solution  0.5 mg Nebulization BID  . carvedilol  12.5 mg Oral BID WC  . Chlorhexidine Gluconate Cloth  6 each Topical Daily  . finasteride  5 mg Oral Daily  . folic acid  [id number] mcg Oral Daily  . furosemide  40 mg Intravenous Daily  . mouth rinse  15 mL Mouth Rinse BID  . multivitamin with minerals  1 tablet Oral Daily  . omega-3 acid ethyl esters  1 g Oral Daily  . pneumococcal 23 valent vaccine  0.5 mL Intramuscular Tomorrow-1000  . sacubitril-valsartan  1 tablet Oral BID  . simvastatin  40 mg Oral q1800  . sodium chloride flush  3 mL Intravenous Q12H  . tamsulosin  0.4 mg Oral Daily  . warfarin  10 mg Oral ONCE-1600  . Warfarin - Pharmacist Dosing Inpatient   Does not apply q1600   Continuous Infusions: . sodium chloride       LOS: 2 days    Time spent: 30 minutes.    Barton Dubois, MD [facility] Pager [phone]   07/03/2019, 1:45 PM

## 2019-07-03 NOTE — Progress Notes (Signed)
Patients BP running soft.  On-Call MD notified.  New instruction to hold scheduled Entresto dose.  Will continue to monitor.

## 2019-07-03 NOTE — Care Management Important Message (Signed)
Important Message  Patient Details  Name: Benjamin Cordova MRN: [id number] Date of Birth: 1934-08-11   Medicare Important Message Given:  Yes     [name] 07/03/2019, 11:51 AM

## 2019-07-04 DIAGNOSIS — N179 Acute kidney failure, unspecified: Secondary | ICD-10-CM

## 2019-07-04 DIAGNOSIS — J9602 Acute respiratory failure with hypercapnia: Secondary | ICD-10-CM

## 2019-07-04 LAB — PROTIME-INR
INR: 1.5 — ABNORMAL HIGH (ref 0.8–1.2)
Prothrombin Time: 17.5 seconds — ABNORMAL HIGH (ref 11.4–15.2)

## 2019-07-04 MED ORDER — CARVEDILOL 6.25 MG PO TABS: 6.2500 mg | ORAL_TABLET | Freq: Two times a day (BID) | ORAL | 1 refills | Status: DC

## 2019-07-04 MED ORDER — WARFARIN SODIUM 5 MG PO TABS: 10.0000 mg | ORAL_TABLET | Freq: Once | ORAL | Status: DC

## 2019-07-04 MED ORDER — SACUBITRIL-VALSARTAN 24-26 MG PO TABS: 1.0000 | ORAL_TABLET | Freq: Two times a day (BID) | ORAL | 1 refills | Status: DC

## 2019-07-04 MED ORDER — FUROSEMIDE 20 MG PO TABS: 20.0000 mg | ORAL_TABLET | Freq: Two times a day (BID) | ORAL | 1 refills | Status: DC

## 2019-07-04 NOTE — Discharge Summary (Signed)
Physician Discharge Summary  Benjamin Cordova [id number] DOB: 1934-04-26 DOA: 06/30/2019  PCP: Celene Squibb, MD  Admit date: 06/30/2019 Discharge date: 07/04/2019  Time spent: 35 minutes  Recommendations for Outpatient Follow-up:  1. Repeat BMET to follow electrolytes and renal function 2. Re-assess volume status and BP, adjust medication regimen as required.    Discharge Diagnoses:  Principal Problem:   Shortness of breath Active Problems:   Coronary artery disease   Hypertension   Peripheral vascular disease (HCC)   Paroxysmal atrial fibrillation (HCC)   S/P CABG x 2   Subtherapeutic international normalized ratio (INR)   Acute respiratory failure with hypoxia and hypercapnia (HCC)   CHF (congestive heart failure) (HCC)   Acute on chronic systolic HF (heart failure) ([place])   Weakness   AKI (acute kidney injury) ([place])   Discharge Condition: stable and improved. Discharge home with [facility] [place] services; outpatient follow up with cardiology in 10 days and with PCP in 2 weeks.  Code status: full code.   Diet recommendation: heart healthy/low sodium diet.  Filed Weights   07/01/19 0100 07/02/19 0500 07/04/19 0300  Weight: 111.3 kg 109.7 kg 111.4 kg    History of present illness:  As per H&P written by Dr. Scherrie November on 06/30/19 [id number] H Tayloris a 85 y.o.malewith medical history significant ofischemic cardiomyopathy, coronary artery disease, hypertension, hyperlipidemia, peripheral vascular disease, history of TIA who presented to the ER with increased weakness.Patient states he has been having increasing weakness at home and today was significantly worse and had difficulty with ambulation and shortness of breath with minimal activity. Has a history of ischemic cardiomyopathy with EF of 30 to 35% and is on Entresto at home. Did see the cardiologist as outpatient on 06/18/2019 and was his Delene Loll was increased at that time. He has a history of polio in the right lower  extremity and in general has been very sedentary at home over the last several weeks. Does use a walker for ambulation. Has not had any chest pain. No increased lower extremity edema. No fever, chills, cough, palpitations. No nausea, vomiting, abdominal pain, diarrhea, dysuria. No seizures, syncope, dizziness, lightheadedness. ED Course: Vital Signs reviewed on presentation, significant forTemperature 97.7, heart rate 108, blood pressure 116/59,saturation 93% on room air. Labs reviewed, significant forsodium 137, potassium 5.0, BUN 42, creatinine 1.4, BNP 630, troponin negative x2, WBC count 6.6, hemoglobin 12.1, hematocrit 37, platelets 135, INR 1.2. Imaging personally Reviewed,CT chest with IV contrast shows small loculated left pleural effusion with pleural thickening, likely chronic. Rounded atelectasis in the left upper and lower lobes, faint groundglass opacities in the right lung suggesting mild edema. EKG personally reviewed, showssinus tachycardia, PVCs as well as PACs.  Hospital Course:  1-acute on chronic systolic and diastolic heart failure -Patient with recent increase in his Entresto. -Ejection fraction in December 2020 of 30 to 35%.  Repeat echo demonstrating grade 1 diastolic dysfunction, ejection fraction 25 to 30%. -Cardiology service has been consulted and per recommendations once acute IV diuresis and euvolemic state achieved, discharge home on adjusted dose of lasix and entresto. -close outpatient follow up for potential nuclear stress test and further medication adjustments. -patient advised to check weight on daily basis and to follow low sodium diet. -Spironolactone remains on hold  2-acute respiratory failure with hypoxia and hypercapnia -Appears to be in the setting of CHF exacerbation as mentioned above -Continue diuresis -Patient speaking in full sentences and denying SOB at discharge. -last chest x-ray prior to discharge, demonstrated improvement  in  vascular congestion/interstitial edema; Right lung is clear.  3-history of paroxysmal atrial fibrillation -Rate controlled and currently sinus -Continue beta-blocker -Continue Coumadin per pharmacy.  4-hypertension -Blood pressure stable and well-controlled -Continue heart healthy diet. -follow VS and adjust regimen as required.  5-acute physical deconditioning and weakness -Will discharge home with HHPT and HHRN -patient encouraged to increase physical activity gradually   6-history of coronary artery disease -Patient status post CABG x2 -Complaining of diaphoresis and chest tightness on presentation. -EKG no demonstrating acute ischemic changes -On admission troponin x3 were negative. -Repeat troponin elevated suggesting demand ischemia rather than ACS per cardiology. -Continue beta-blocker and statins. -no aspirin as patient chronically on coumadin.  -Cardiology service recommendations ( are for outpatient nuclear stress test and continue medical management). -advise to follow heart healthy diet.  7-BPH -Continue Flomax and finasteride -Denies urinary retention symptoms.  8-acute kidney injury on chronic kidney disease a stage IIIa -Appears to be associated with recent increase in his Entresto medication and acute CHF exacerbation. -Entresto Dose has been reduced -Spironolactone continue to be on hold at discharge. -After aggressive IV diuresis creatinine peaked to 1.25; now down to 1.05-1.07 an stable. -Lasix dosage adjusted per cardiology recommendations -Patient advised to maintain adequate hydration. -repeat BMET at follow up visit to reassess renal function stability  Procedures:  See below for x-ray reports  2-D echo:  1. Laminated mural thrombus seen in LV apex. Left ventricular ejection  fraction, by estimation, is 25 to 30%. The left ventricle has severely  decreased function. The left ventricle demonstrates global hypokinesis.  The left ventricular  internal cavity size was mildly dilated. Left ventricular diastolic parameters are consistent with Grade I diastolic dysfunction (impaired relaxation).  2. The mitral valve is grossly normal. Trivial mitral valve regurgitation.  3. The aortic valve is tricuspid. Aortic valve regurgitation is not  visualized. No aortic stenosis is present.  4. There is normal pulmonary artery systolic pressure.  See below for x-ray reports  Consultations:  Cardiology service.   Discharge Exam: Vitals:   07/04/19 0728 07/04/19 0943  BP:  118/69  Pulse:  99  Resp:  16  Temp:    SpO2: 98%    General exam: Alert, awake, oriented x 3; in no acute distress, denying chest pain, palpitations and orthopnea.  no fever and feeling ready to go home.  Respiratory system: decrease BS at the bases, no wheezing, no frank crackles. No using accessory muscles.  Normal respiratory effort. Cardiovascular system:Rate controlled, no rubs, no gallops, no JVD. Gastrointestinal system: Abdomen is nondistended, soft and nontender. No organomegaly or masses felt. Normal bowel sounds heard. Central nervous system: Alert and oriented. No focal neurological deficits. Extremities: Noticed trace edema bilaterally (left more than right; per patient's report they are back to baseline) Skin: No rashes, no petechiae or open wounds.  Psychiatry: Judgement and insight appear normal. Mood & affect appropriate.    Discharge Instructions   Discharge Instructions    (HEART FAILURE PATIENTS) Call MD:  Anytime you have any of the following symptoms: 1) 3 pound weight gain in 24 hours or 5 pounds in 1 week 2) shortness of breath, with or without a dry hacking cough 3) swelling in the hands, feet or stomach 4) if you have to sleep on extra pillows at night in order to breathe.   Complete by: As directed    Diet - low sodium heart healthy   Complete by: As directed    Discharge instructions   Complete  by: As directed    Follow up with  cardiology service in the next 10 days (office will contact you with appointment details) Please arrange follow up with PCP in 2 weeks Follow low sodium diet (less than 2 gram daily Maintain adequate hydration Take medications as prescribed Check your weight on daily basis.     Allergies as of 07/04/2019   No Known Allergies     Medication List    STOP taking these medications   Entresto 49-51 MG Generic drug: sacubitril-valsartan Replaced by: sacubitril-valsartan 24-26 MG   Saw Palmetto 500 MG Caps   spironolactone 25 MG tablet Commonly known as: ALDACTONE     TAKE these medications   carvedilol 6.25 MG tablet Commonly known as: COREG Take 1 tablet (6.25 mg total) by mouth 2 (two) times daily with a meal. What changed:   medication strength  See the new instructions.   CertaVite/Antioxidants Tabs Take 1 tablet by mouth daily.   diphenhydramine-acetaminophen 25-500 MG Tabs tablet Commonly known as: TYLENOL PM Take 2 tablets by mouth at bedtime.   ferrous Q000111Q C-folic acid capsule Commonly known as: TRINSICON / FOLTRIN Take 1 capsule by mouth at bedtime.   finasteride 5 MG tablet Commonly known as: PROSCAR Take 5 mg by mouth daily.   folic acid Q000111Q MCG tablet Commonly known as: FOLVITE Take 400 mcg by mouth daily.   furosemide 20 MG tablet Commonly known as: LASIX Take 1 tablet (20 mg total) by mouth 2 (two) times daily. What changed: when to take this   Omega-3 500 MG Caps Take 1 capsule by mouth daily.   potassium chloride 10 MEQ tablet Commonly known as: KLOR-CON Take 10 mEq by mouth daily. Take with Lasix (Furosemide)   sacubitril-valsartan 24-26 MG Commonly known as: ENTRESTO Take 1 tablet by mouth 2 (two) times daily. Replaces: Entresto 49-51 MG   simvastatin 40 MG tablet Commonly known as: ZOCOR Take 40 mg by mouth daily.   Super B-Complex/Vit C/FA Tabs Take 1 tablet by mouth daily.   tamsulosin 0.4 MG Caps  capsule Commonly known as: FLOMAX Take 0.4 mg by mouth daily.   vitamin C 500 MG tablet Commonly known as: ASCORBIC ACID Take 1,000 mg by mouth 2 (two) times daily.   warfarin 5 MG tablet Commonly known as: COUMADIN Take 1.5 tablets (7.5 mg total) by mouth daily at 6 PM. OR AS DIRECTED by MD. What changed:   how much to take  when to take this  additional instructions      No Known Allergies Follow-up Information    [facility] Follow up.   Specialty: Home Health Services Why: They will contact you to schedule appointment for [place] and physical therapy Contact information: [place] [address] [phone]        Celene Squibb, MD. Schedule an appointment as soon as possible for a visit in 2 week(s).   Specialty: Internal Medicine Contact information: [facility] [address] [phone]        Satira Sark, MD .   Specialty: Cardiology Contact information: [place] [address] [phone]           The results of significant diagnostics from this hospitalization (including imaging, microbiology, ancillary and laboratory) are listed below for reference.    Significant Diagnostic Studies: CT Chest W Contrast  Result Date: 06/30/2019 CLINICAL DATA:  Abnormal chest radiograph, loculated pleural effusion EXAM: CT  CHEST WITH CONTRAST TECHNIQUE: Multidetector CT imaging of the chest was performed during intravenous contrast administration. CONTRAST:  23mL OMNIPAQUE IOHEXOL 300 MG/ML  SOLN COMPARISON:  Chest radiograph dated 06/30/2019 FINDINGS: Cardiovascular: Heart is normal in size.  No pericardial effusion. No evidence of thoracic aortic aneurysm. Atherosclerotic calcifications of the aortic arch. Three vessel coronary atherosclerosis. Postsurgical changes related to prior CABG. Mediastinum/Nodes: Small mediastinal lymph nodes, including a 13 mm right azygoesophageal  recess node and a 12 mm inter lobar right lower lobe node (series 2/image 70), likely reactive. Visualized thyroid is unremarkable. Lungs/Pleura: Subpleural scarring/rounded atelectasis in the left upper lobe (series 4/image 59). Rounded atelectasis in the left lower lobe (series 4/image 87). Small left pleural effusion with pleural thickening (series 2/image 118), favoring a chronic loculated pleural effusion (given the associated findings) over an empyema. Faint ground-glass opacities in the right upper and lower lobes (for example, series 4/image 47) suggest very mild edema or less likely infection. Mild right basilar opacity, likely atelectasis. Mild centrilobular and paraseptal emphysematous changes, right upper lobe predominant. No pneumothorax. Upper Abdomen: Visualized upper abdomen is grossly unremarkable. Musculoskeletal: Degenerative changes of the visualized thoracolumbar spine. Median sternotomy. IMPRESSION: Rounded atelectasis in the left upper and lower lobes, likely accounting for the radiographic abnormality. Small loculated left pleural effusion with pleural thickening, likely chronic. Empyema is considered unlikely given the associated findings. Faint ground-glass opacities in the right lung suggests mild edema or less likely infection. Aortic Atherosclerosis (ICD10-I70.0) and Emphysema (ICD10-J43.9). Electronically Signed   By: Julian Hy M.D.   On: 06/30/2019 21:49   DG Chest Port 1 View  Result Date: 07/03/2019 CLINICAL DATA:  Congestive heart failure EXAM: PORTABLE CHEST 1 VIEW COMPARISON:  July 01, 2019 FINDINGS: There has been significant resolution of alveolar edema compared to recent study. There is loculated pleural effusion on the left with apparent scarring and atelectatic change in the left mid lung. Consolidation in the left lung base may represent residual edema, although superimposed pneumonia must be of concern. Right lung is now essentially clear. There is cardiomegaly  with pulmonary vascularity within normal limits. Status post median sternotomy. No adenopathy. No bone lesions. IMPRESSION: Interval significant resolution of alveolar edema since 2 days prior. There remains loculated left pleural effusion with scarring/atelectasis in the left mid lung and consolidation in the left base. Question pneumonia versus residual edema left base. Both entities may be present concurrently. Right lung now clear. Stable cardiac silhouette. No adenopathy. Electronically Signed   By: Lowella Grip III M.D.   On: 07/03/2019 10:13   DG CHEST PORT 1 VIEW  Result Date: 07/01/2019 CLINICAL DATA:  Shortness of breath EXAM: PORTABLE CHEST 1 VIEW COMPARISON:  June 30, 2019 chest radiograph and chest CT FINDINGS: There is new airspace opacity in the right upper and lower lung regions. Consolidation in the periphery of the left mid lung and left base regions remains with small left pleural effusion. There is mild cardiomegaly with pulmonary venous hypertension. No adenopathy. Patient status post median sternotomy. No pneumothorax. No bone lesions. IMPRESSION: New airspace consolidation on the right which may represent progression of pulmonary edema or pneumonia. Both entities may be present concurrently. Irregular opacity persists on the left without change. There is a small left pleural effusion. Electronically Signed   By: Lowella Grip III M.D.   On: 07/01/2019 10:10   DG Chest Port 1 View  Result Date: 06/30/2019 CLINICAL DATA:  Dyspnea EXAM: PORTABLE CHEST 1 VIEW COMPARISON:  09/01/2015 FINDINGS: Post sternotomy  changes. Probable trace right-sided pleural effusion. Small left loculated pleural effusion or pleural thickening. Irregular peripheral opacity in the left mid lung contiguous with the pleural surface. Consolidation at the left base. Stable cardiomediastinal silhouette. No pneumothorax. IMPRESSION: 1. Left loculated pleural effusion or pleural thickening with irregular  opacity in the left mid lung extending to the pleural surface, suggest chest CT for further evaluation. There is also airspace disease at the left base 2. Probable trace right pleural effusion Electronically Signed   By: Donavan Foil M.D.   On: 06/30/2019 20:20   ECHOCARDIOGRAM LIMITED  Result Date: 07/01/2019    ECHOCARDIOGRAM LIMITED REPORT   Patient Name:   ARJEN SIEGER Date of Exam: 07/01/2019 Medical Rec #:  [id number]       Height:       68.0 in Accession #:    [id number]      Weight:       245.4 lb Date of Birth:  28-Jun-1934       BSA:          2.229 m Patient Age:    23 years        BP:           102/74 mmHg Patient Gender: M               HR:           48 bpm. Exam Location:  [facility] Procedure: Intracardiac Opacification Agent and Limited Echo Indications:    Dyspnea 786.09 / R06.00  History:        Patient has prior history of Echocardiogram examinations, most                 recent 03/13/2019. CHF, CAD, Prior CABG, Arrythmias:Atrial                 Fibrillation, Signs/Symptoms:Chest Pain; Risk                 Factors:Hypertension, Former Smoker and Dyslipidemia. LV (left                 ventricular) mural thrombus.  Sonographer:    Leavy Cella RDCS (AE) Referring Phys: [place]  1. Laminated mural thrombus seen in LV apex. Left ventricular ejection fraction, by estimation, is 25 to 30%. The left ventricle has severely decreased function. The left ventricle demonstrates global hypokinesis. The left ventricular internal cavity size was mildly dilated. Left ventricular diastolic parameters are consistent with Grade I diastolic dysfunction (impaired relaxation).  2. The mitral valve is grossly normal. Trivial mitral valve regurgitation.  3. The aortic valve is tricuspid. Aortic valve regurgitation is not visualized. No aortic stenosis is present.  4. There is normal pulmonary artery systolic pressure. FINDINGS  Left Ventricle: Laminated mural thrombus seen in LV apex. Left  ventricular ejection fraction, by estimation, is 25 to 30%. The left ventricle has severely decreased function. The left ventricle demonstrates global hypokinesis. Definity contrast agent was given IV to delineate the left ventricular endocardial borders. The left ventricular internal cavity size was mildly dilated. There is no left ventricular hypertrophy. Normal left ventricular filling pressure.  LV Wall Scoring: The apex is akinetic. Right Ventricle: There is normal pulmonary artery systolic pressure. The tricuspid regurgitant velocity is 1.52 m/s, and with an assumed right atrial pressure of 10 mmHg, the estimated right ventricular systolic pressure is Q000111Q mmHg. Pericardium: There is no evidence of pericardial effusion. Mitral Valve: The mitral valve is grossly normal. Mild  mitral annular calcification. Trivial mitral valve regurgitation. Tricuspid Valve: The tricuspid valve is grossly normal. Tricuspid valve regurgitation is trivial. Aortic Valve: The aortic valve is tricuspid. Aortic valve regurgitation is not visualized. No aortic stenosis is present. Moderate aortic valve annular calcification. Pulmonic Valve: The pulmonic valve was grossly normal. Pulmonic valve regurgitation is not visualized. Aorta: The aortic root is normal in size and structure.  LEFT VENTRICLE PLAX 2D LVIDd:         5.80 cm  Diastology LVIDs:         5.06 cm  LV e' lateral:   5.03 cm/s LV PW:         0.95 cm  LV E/e' lateral: 7.7 LV IVS:        1.00 cm  LV e' medial:    5.48 cm/s LVOT diam:     2.30 cm  LV E/e' medial:  7.0 LV SV:         62 LV SV Index:   28 LVOT Area:     4.15 cm  LEFT ATRIUM         Index LA diam:    4.40 cm 1.97 cm/m  AORTIC VALVE LVOT Vmax:   66.80 cm/s LVOT Vmean:  46.800 cm/s LVOT VTI:    0.149 m  AORTA Ao Root diam: 3.40 cm MITRAL VALVE               TRICUSPID VALVE MV Area (PHT): 3.61 cm    TR Peak grad:   9.2 mmHg MV Decel Time: 210 msec    TR Vmax:        152.00 cm/s MV E velocity: 38.60 cm/s MV A  velocity: 70.30 cm/s  SHUNTS MV E/A ratio:  0.55        Systemic VTI:  0.15 m                            Systemic Diam: 2.30 cm Kate Sable MD Electronically signed by Kate Sable MD Signature Date/Time: 07/01/2019/3:48:41 PM    Final     Microbiology: Recent Results (from the past 240 hour(s))  SARS CORONAVIRUS 2 (TAT 6-24 HRS) Nasopharyngeal Nasopharyngeal Swab     Status: None   Collection Time: 06/30/19 11:38 PM   Specimen: Nasopharyngeal Swab  Result Value Ref Range Status   SARS Coronavirus 2 NEGATIVE NEGATIVE Final    Comment: (NOTE) SARS-CoV-2 target nucleic acids are NOT DETECTED. The SARS-CoV-2 RNA is generally detectable in upper and lower respiratory specimens during the acute phase of infection. Negative results do not preclude SARS-CoV-2 infection, do not rule out co-infections with other pathogens, and should not be used as the sole basis for treatment or other patient management decisions. Negative results must be combined with clinical observations, patient history, and epidemiological information. The expected result is Negative. Fact Sheet for Patients: SugarRoll.be Fact Sheet for Healthcare Providers: https://www.woods-mathews.com/ This test is not yet approved or cleared by the [place] FDA and  has been authorized for detection and/or diagnosis of SARS-CoV-2 by FDA under an Emergency Use Authorization (EUA). This EUA will remain  in effect (meaning this test can be used) for the duration of the COVID-19 declaration under Section 56 4(b)(1) of the Act, 21 U.S.C. section 360bbb-3(b)(1), unless the authorization is terminated or revoked sooner. Performed at [facility], [place] [address] [place], [place] [address]   Respiratory Panel by RT PCR (Flu A&B, Covid) - Nasopharyngeal Swab  Status: None   Collection Time: 07/01/19 10:08 AM   Specimen: Nasopharyngeal Swab  Result Value Ref Range Status    SARS Coronavirus 2 by RT PCR NEGATIVE NEGATIVE Final    Comment: (NOTE) SARS-CoV-2 target nucleic acids are NOT DETECTED. The SARS-CoV-2 RNA is generally detectable in upper respiratoy specimens during the acute phase of infection. The lowest concentration of SARS-CoV-2 viral copies this assay can detect is 131 copies/mL. A negative result does not preclude SARS-Cov-2 infection and should not be used as the sole basis for treatment or other patient management decisions. A negative result may occur with  improper specimen collection/handling, submission of specimen other than nasopharyngeal swab, presence of viral mutation(s) within the areas targeted by this assay, and inadequate number of viral copies (<131 copies/mL). A negative result must be combined with clinical observations, patient history, and epidemiological information. The expected result is Negative. Fact Sheet for Patients:  PinkCheek.be Fact Sheet for Healthcare Providers:  GravelBags.it This test is not yet ap proved or cleared by the [place] FDA and  has been authorized for detection and/or diagnosis of SARS-CoV-2 by FDA under an Emergency Use Authorization (EUA). This EUA will remain  in effect (meaning this test can be used) for the duration of the COVID-19 declaration under Section 564(b)(1) of the Act, 21 U.S.C. section 360bbb-3(b)(1), unless the authorization is terminated or revoked sooner.    Influenza A by PCR NEGATIVE NEGATIVE Final   Influenza B by PCR NEGATIVE NEGATIVE Final    Comment: (NOTE) The Xpert Xpress SARS-CoV-2/FLU/RSV assay is intended as an aid in  the diagnosis of influenza from Nasopharyngeal swab specimens and  should not be used as a sole basis for treatment. Nasal washings and  aspirates are unacceptable for Xpert Xpress SARS-CoV-2/FLU/RSV  testing. Fact Sheet for Patients: PinkCheek.be Fact  Sheet for Healthcare Providers: GravelBags.it This test is not yet approved or cleared by the [place] FDA and  has been authorized for detection and/or diagnosis of SARS-CoV-2 by  FDA under an Emergency Use Authorization (EUA). This EUA will remain  in effect (meaning this test can be used) for the duration of the  Covid-19 declaration under Section 564(b)(1) of the Act, 21  U.S.C. section 360bbb-3(b)(1), unless the authorization is  terminated or revoked. Performed at [facility], [address] [place], [place] [address]   MRSA PCR Screening     Status: None   Collection Time: 07/01/19 11:53 AM   Specimen: Nasal Mucosa; Nasopharyngeal  Result Value Ref Range Status   MRSA by PCR NEGATIVE NEGATIVE Final    Comment:        The GeneXpert MRSA Assay (FDA approved for NASAL specimens only), is one component of a comprehensive MRSA colonization surveillance program. It is not intended to diagnose MRSA infection nor to guide or monitor treatment for MRSA infections. Performed at [facility], [address], [place], [place] [address]      Labs: Basic Metabolic Panel: Recent Labs  Lab 06/30/19 1952 07/01/19 0534 07/02/19 0439 07/03/19 0514  NA 137 138 142 138  K 5.0 5.2* 4.5 4.4  CL 104 103 103 97*  CO2 25 27 26  32  GLUCOSE 119* 138* 86 111*  BUN 42* 38* 44* 46*  CREATININE 1.40* 1.10 1.25* 1.07  CALCIUM 9.0 8.9 9.1 8.5*  MG 2.4  --  2.3  --    CBC: Recent Labs  Lab 06/30/19 1952 07/01/19 0534  WBC 6.6 9.6  NEUTROABS 5.4  --  HGB 12.1* 11.8*  HCT 37.8* 38.3*  MCV 95.2 96.5  PLT 135* 154   BNP (last 3 results) Recent Labs    06/30/19 2000  BNP 630.0*   CBG: Recent Labs  Lab 07/01/19 0857  GLUCAP 276*    Signed:  Barton Dubois MD.  [facility] 07/04/2019, 10:50 AM

## 2019-07-04 NOTE — TOC Transition Note (Signed)
Transition of Care [facility]) - CM/SW Discharge Note   Patient Details  Name: Benjamin Cordova MRN: [id number] Date of Birth: [birth year]  [facility] CM/SW Contact:  Sherald Barge, RN Phone Number: 07/04/2019, 10:20 AM   Clinical Narrative:   Discharging home today with [facility]. Pt hard of hearing and requested son Mortimer Fries be called for DC needs. Family has no preference of [place] provider. Meredeth Ide rep, accepted referral.     Final next level of care: [place]     Patient Goals and CMS Choice Patient states their goals for this hospitalization and ongoing recovery are:: feel better CMS Medicare.gov Compare Post Acute Care list provided to:: Patient Represenative (must comment)(Son, Mortimer Fries) Choice offered to / list presented to : Adult Children   Permission Sought/Granted Permission sought to share information with : Family Supports(told to call "Mortimer Fries") Permission granted to share information with : Yes, Verbal Permission Granted Share Information with NAME: Mortimer Fries   Permission granted to share info w Relationship: son   Activities of Daily Living Home Assistive Devices/Equipment: Hearing aid, Shower chair with back, Environmental consultant (specify type), Cane (specify quad or straight), Dentures (specify type)(lift chair) ADL Screening (condition at time of admission) Patient's cognitive ability adequate to safely complete daily activities?: Yes Is the patient deaf or have difficulty hearing?: Yes Does the patient have difficulty seeing, even when wearing glasses/contacts?: No Does the patient have difficulty concentrating, remembering, or making decisions?: No Patient able to express need for assistance with ADLs?: Yes Does the patient have difficulty dressing or bathing?: No Independently performs ADLs?: Yes (appropriate for developmental age) Does the patient have difficulty walking or climbing stairs?: Yes Weakness of Legs: Both Weakness of Arms/Hands:  None    Prior Living Arrangements/Services Living arrangements for the past 2 months: Single Family Home Lives with:: Self   Do you feel safe going back to the place where you live?: Yes Need for Family Participation in Patient Care: Yes (Comment)(son helps around the house) Care giver support system in place?: Yes (comment) Current home services: Home RN, Home PT Patient language and need for interpreter reviewed:: Yes  Discharge Plan and Services     Post Acute Care Choice: Home Health                    HH Arranged: RN, PT [facility] Agency: [place] Date [facility] Agency Contacted: 07/04/19 Time HH Agency Contacted: 1018 Representative spoke with at [place]: [place]      Readmission Risk Prevention Plan 07/04/2019  Transportation Screening Complete  PCP or Specialist Appt within 3-5 Days Not Complete  HRI or [place] Complete  Social Work Consult for [place] Planning/Counseling Complete  Palliative Care Screening Not Applicable  Medication Review Press photographer) Complete  Some recent data might be hidden

## 2019-07-04 NOTE — Progress Notes (Signed)
ANTICOAGULATION CONSULT NOTE -   Pharmacy Consult for warfarin Indication: atrial fibrillation/ hx of LV thrombus  No Known Allergies  Patient Measurements: Height: 5\' 8"  (172.7 cm) Weight: 111.4 kg (245 lb 9.5 oz) IBW/kg (Calculated) : 68.4  Vital Signs: Temp: 97.9 F (36.6 C) (04/03 0559) Temp Source: Oral (04/03 0559) BP: 113/48 (04/03 0559) Pulse Rate: 86 (04/03 0559)  Labs: Recent Labs    07/01/19 [id number] 07/02/19 0439 07/03/19 0514 07/04/19 0624  LABPROT  --  15.6* 16.8* 17.5*  INR  --  1.3* 1.4* 1.5*  CREATININE  --  1.25* 1.07  --   TROPONINIHS 21* 32*  --   --     Estimated Creatinine Clearance: 64.4 mL/min (by C-G formula based on SCr of 1.07 mg/dL).   Medical History: Past Medical History:  Diagnosis Date  . Arthritis   . Coronary artery disease    Multivessel s/p CABG 1981 at [facility]  . Essential hypertension   . Hematuria   . History of blood transfusion 07/2015   Knee replacement HgB <9  . History of pneumonia   . History of stroke   . Ischemic cardiomyopathy   . Mixed hyperlipidemia   . Myocardial infarction ([place]) 1980  . Peripheral vascular disease ([place])   . 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 Skin cancer   . TIA (transient ischemic attack) 1989   Chronic coumadin  . UTI (lower urinary tract infection)     Medications:  Medications Prior to Admission  Medication Sig Dispense Refill Last Dose  . carvedilol (COREG) 12.5 MG tablet TAKE 1 TABLET TWICE DAILY  (STOP  ATENOLOL) (Patient taking differently: Take 12.5 mg by mouth 2 (two) times daily with a meal. ) 180 tablet 3 06/30/2019  . diphenhydramine-acetaminophen (TYLENOL PM) 25-500 MG TABS tablet Take 2 tablets by mouth at bedtime.    06/30/2019  . ferrous Q000111Q C-folic acid (TRINSICON / FOLTRIN) capsule Take 1 capsule by mouth at bedtime.   06/30/2019  . finasteride (PROSCAR) 5 MG tablet Take 5 mg by mouth daily.   [id number]  . folic acid (FOLVITE)  Q000111Q MCG tablet Take 400 mcg by mouth daily.    06/30/2019  . furosemide (LASIX) 20 MG tablet Take 20 mg by mouth daily.    06/30/2019  . Krill Oil (OMEGA-3) 500 MG CAPS Take 1 capsule by mouth daily.   06/30/2019  . Multiple Vitamins-Minerals (CERTAVITE/ANTIOXIDANTS) TABS Take 1 tablet by mouth daily.    06/30/2019  . potassium chloride (K-DUR) 10 MEQ tablet Take 10 mEq by mouth daily. Take with Lasix (Furosemide)   06/30/2019  . sacubitril-valsartan (ENTRESTO) 49-51 MG Take 1 tablet by mouth 2 (two) times daily. 180 tablet 3 06/30/2019  . Saw Palmetto 500 MG CAPS Take 1 capsule by mouth 2 (two) times daily.   06/30/2019  . simvastatin (ZOCOR) 40 MG tablet Take 40 mg by mouth daily.   06/30/2019  . spironolactone (ALDACTONE) 25 MG tablet Take 1 tablet (25 mg total) by mouth daily. 90 tablet 3 06/30/2019  . tamsulosin (FLOMAX) 0.4 MG CAPS capsule Take 0.4 mg by mouth daily.   06/30/2019  . vitamin C (ASCORBIC ACID) 500 MG tablet Take 1,000 mg by mouth 2 (two) times daily.    06/30/2019  . warfarin (COUMADIN) 5 MG tablet Take 1.5 tablets (7.5 mg total) by mouth daily at 6 PM. OR AS DIRECTED by MD. (Patient taking differently: Take 5-10 mg by mouth See  admin instructions. Take 10 mg on Sun, Monday, Thurs.   Take 5 mg on Tues, Wed, Sat.) 60 tablet 0 06/30/2019  . B Complex-C-Folic Acid (SUPER B-COMPLEX/VIT C/FA) TABS Take 1 tablet by mouth daily.     06/30/2019 at Unknown time    Assessment: Pharmacy consulted to dose warfarin in patient with atrial fibrillation.    Home dose listed as 7.5 mg daily.  Dose not given 3/31: patient was on BIPAP and made NPO- taking PO meds now. INR slowly increasing, still subtherapeutic at 1.5  Goal of Therapy:  INR 2-3 Monitor platelets by anticoagulation protocol: Yes   Plan:  Warfarin 10mg  x 1 dose. Monitor labs and s/s of bleeding. Daily PT-INR  Isac Sarna, BS [place], [place] Clinical Pharmacist Pager [phone] 07/04/2019 8:53 AM

## 2019-07-04 NOTE — Progress Notes (Signed)
BiPAP is not needed patient on 2 liters oxygen,

## 2019-07-04 NOTE — Plan of Care (Signed)
  Problem: Education: Goal: Knowledge of General Education information will improve Description: Including pain rating scale, medication(s)/side effects and non-pharmacologic comfort measures 07/04/2019 1041 by Santa Lighter, RN Outcome: Adequate for Discharge 07/04/2019 1041 by Santa Lighter, RN Outcome: Progressing   Problem: Clinical Measurements: Goal: Ability to maintain clinical measurements within normal limits will improve 07/04/2019 1041 by Santa Lighter, RN Outcome: Adequate for Discharge 07/04/2019 1041 by Santa Lighter, RN Outcome: Progressing Goal: Will remain free from infection Outcome: Adequate for Discharge Goal: Diagnostic test results will improve Outcome: Adequate for Discharge Goal: Respiratory complications will improve Outcome: Adequate for Discharge Goal: Cardiovascular complication will be avoided Outcome: Adequate for Discharge   Problem: Activity: Goal: Risk for activity intolerance will decrease Outcome: Adequate for Discharge   Problem: Nutrition: Goal: Adequate nutrition will be maintained Outcome: Adequate for Discharge   Problem: Coping: Goal: Level of anxiety will decrease Outcome: Adequate for Discharge   Problem: Elimination: Goal: Will not experience complications related to bowel motility Outcome: Adequate for Discharge Goal: Will not experience complications related to urinary retention Outcome: Adequate for Discharge   Problem: Pain Managment: Goal: General experience of comfort will improve Outcome: Adequate for Discharge   Problem: Safety: Goal: Ability to remain free from injury will improve Outcome: Adequate for Discharge   Problem: Skin Integrity: Goal: Risk for impaired skin integrity will decrease Outcome: Adequate for Discharge   Problem: Education: Goal: Ability to demonstrate management of disease process will improve Outcome: Adequate for Discharge Goal: Ability to verbalize understanding of medication therapies  will improve Outcome: Adequate for Discharge Goal: Individualized Educational Video(s) Outcome: Adequate for Discharge   Problem: Cardiac: Goal: Ability to achieve and maintain adequate cardiopulmonary perfusion will improve Outcome: Adequate for Discharge

## 2019-07-04 NOTE — Plan of Care (Signed)

## 2019-07-04 NOTE — Progress Notes (Signed)
Nsg Discharge Note  Admit Date:  06/30/2019 Discharge date: 07/04/2019   [name] to be D/C'd Home per MD order.  AVS completed.  Copy for chart, and copy for patient signed, and dated. Removed IV-clean, dry, intact. Reviewed d/c paperwork with patient and son. Answered all questions. Wheeled stable patient and belongings to main entrance where he was picked up by son to d/c to home. Patient/caregiver able to verbalize understanding.  Discharge Medication: Allergies as of 07/04/2019   No Known Allergies     Medication List    STOP taking these medications   Entresto 49-51 MG Generic drug: sacubitril-valsartan Replaced by: sacubitril-valsartan 24-26 MG   Saw Palmetto 500 MG Caps   spironolactone 25 MG tablet Commonly known as: ALDACTONE     TAKE these medications   carvedilol 6.25 MG tablet Commonly known as: COREG Take 1 tablet (6.25 mg total) by mouth 2 (two) times daily with a meal. What changed:   medication strength  See the new instructions.   CertaVite/Antioxidants Tabs Take 1 tablet by mouth daily.   diphenhydramine-acetaminophen 25-500 MG Tabs tablet Commonly known as: TYLENOL PM Take 2 tablets by mouth at bedtime.   ferrous Q000111Q C-folic acid capsule Commonly known as: TRINSICON / FOLTRIN Take 1 capsule by mouth at bedtime.   finasteride 5 MG tablet Commonly known as: PROSCAR Take 5 mg by mouth daily.   folic acid Q000111Q MCG tablet Commonly known as: FOLVITE Take 400 mcg by mouth daily.   furosemide 20 MG tablet Commonly known as: LASIX Take 1 tablet (20 mg total) by mouth 2 (two) times daily. What changed: when to take this   Omega-3 500 MG Caps Take 1 capsule by mouth daily.   potassium chloride 10 MEQ tablet Commonly known as: KLOR-CON Take 10 mEq by mouth daily. Take with Lasix (Furosemide)   sacubitril-valsartan 24-26 MG Commonly known as: ENTRESTO Take 1 tablet by mouth 2 (two) times daily. Replaces: Entresto 49-51  MG   simvastatin 40 MG tablet Commonly known as: ZOCOR Take 40 mg by mouth daily.   Super B-Complex/Vit C/FA Tabs Take 1 tablet by mouth daily.   tamsulosin 0.4 MG Caps capsule Commonly known as: FLOMAX Take 0.4 mg by mouth daily.   vitamin C 500 MG tablet Commonly known as: ASCORBIC ACID Take 1,000 mg by mouth 2 (two) times daily.   warfarin 5 MG tablet Commonly known as: COUMADIN Take 1.5 tablets (7.5 mg total) by mouth daily at 6 PM. OR AS DIRECTED by MD. What changed:   how much to take  when to take this  additional instructions       Discharge Assessment: Vitals:   07/04/19 0728 07/04/19 0943  BP:  118/69  Pulse:  99  Resp:  16  Temp:    SpO2: 98%    Skin clean, dry and intact without evidence of skin break down, no evidence of skin tears noted. IV catheter discontinued intact. Site without signs and symptoms of complications - no redness or edema noted at insertion site, patient denies c/o pain - only slight tenderness at site.  Dressing with slight pressure applied.  D/c Instructions-Education: Discharge instructions given to patient/family with verbalized understanding. D/c education completed with patient/family including follow up instructions, medication list, d/c activities limitations if indicated, with other d/c instructions as indicated by MD - patient able to verbalize understanding, all questions fully answered. Patient instructed to return to ED, call 911, or call MD for any changes in condition.  Patient escorted via [place], and D/C home via private auto.  Santa Lighter, RN 07/04/2019 1:42 PM

## 2019-07-07 ENCOUNTER — Other Ambulatory Visit: Payer: Self-pay

## 2019-07-07 NOTE — Patient Outreach (Signed)
[place] [facility]) Care Management  07/07/2019  JESTER CRITTENDEN 04/15/1934 [id number]     Transition of Care Referral  Referral Date: 07/07/2019 Referral Source: [facility] Discharge Report Date of Admission: Diagnosis: Date of Discharge: 07/04/2019 Facility: [place]    Referral received. Transition of care calls being completed via EMMI-automated calls. RN CM will outreach patient for any red flags received.     Plan: RN CM will close case at this time.    Enzo Montgomery, RN,BSN,CCM [place] Management Telephonic Care Management Coordinator Direct Phone: [phone] Toll Free: [phone] Fax: [phone]

## 2019-07-08 DIAGNOSIS — I480 Paroxysmal atrial fibrillation: Secondary | ICD-10-CM | POA: Diagnosis not present

## 2019-07-08 DIAGNOSIS — N1831 Chronic kidney disease, stage 3a: Secondary | ICD-10-CM | POA: Diagnosis not present

## 2019-07-08 DIAGNOSIS — I083 Combined rheumatic disorders of mitral, aortic and tricuspid valves: Secondary | ICD-10-CM | POA: Diagnosis not present

## 2019-07-08 DIAGNOSIS — I130 Hypertensive heart and chronic kidney disease with heart failure and stage 1 through stage 4 chronic kidney disease, or unspecified chronic kidney disease: Secondary | ICD-10-CM | POA: Diagnosis not present

## 2019-07-08 DIAGNOSIS — J9602 Acute respiratory failure with hypercapnia: Secondary | ICD-10-CM | POA: Diagnosis not present

## 2019-07-08 DIAGNOSIS — I5043 Acute on chronic combined systolic (congestive) and diastolic (congestive) heart failure: Secondary | ICD-10-CM | POA: Diagnosis not present

## 2019-07-08 DIAGNOSIS — J9601 Acute respiratory failure with hypoxia: Secondary | ICD-10-CM | POA: Diagnosis not present

## 2019-07-08 DIAGNOSIS — I10 Essential (primary) hypertension: Secondary | ICD-10-CM | POA: Diagnosis not present

## 2019-07-08 DIAGNOSIS — I0981 Rheumatic heart failure: Secondary | ICD-10-CM | POA: Diagnosis not present

## 2019-07-08 DIAGNOSIS — I739 Peripheral vascular disease, unspecified: Secondary | ICD-10-CM | POA: Diagnosis not present

## 2019-07-09 DIAGNOSIS — I480 Paroxysmal atrial fibrillation: Secondary | ICD-10-CM | POA: Diagnosis not present

## 2019-07-09 DIAGNOSIS — I130 Hypertensive heart and chronic kidney disease with heart failure and stage 1 through stage 4 chronic kidney disease, or unspecified chronic kidney disease: Secondary | ICD-10-CM | POA: Diagnosis not present

## 2019-07-09 DIAGNOSIS — I5043 Acute on chronic combined systolic (congestive) and diastolic (congestive) heart failure: Secondary | ICD-10-CM | POA: Diagnosis not present

## 2019-07-09 DIAGNOSIS — N1831 Chronic kidney disease, stage 3a: Secondary | ICD-10-CM | POA: Diagnosis not present

## 2019-07-09 DIAGNOSIS — I083 Combined rheumatic disorders of mitral, aortic and tricuspid valves: Secondary | ICD-10-CM | POA: Diagnosis not present

## 2019-07-09 DIAGNOSIS — J9602 Acute respiratory failure with hypercapnia: Secondary | ICD-10-CM | POA: Diagnosis not present

## 2019-07-09 DIAGNOSIS — I739 Peripheral vascular disease, unspecified: Secondary | ICD-10-CM | POA: Diagnosis not present

## 2019-07-09 DIAGNOSIS — J9601 Acute respiratory failure with hypoxia: Secondary | ICD-10-CM | POA: Diagnosis not present

## 2019-07-09 DIAGNOSIS — I0981 Rheumatic heart failure: Secondary | ICD-10-CM | POA: Diagnosis not present

## 2019-07-10 DIAGNOSIS — D509 Iron deficiency anemia, unspecified: Secondary | ICD-10-CM | POA: Diagnosis not present

## 2019-07-10 DIAGNOSIS — I5043 Acute on chronic combined systolic (congestive) and diastolic (congestive) heart failure: Secondary | ICD-10-CM | POA: Diagnosis not present

## 2019-07-10 DIAGNOSIS — Z0000 Encounter for general adult medical examination without abnormal findings: Secondary | ICD-10-CM | POA: Diagnosis not present

## 2019-07-10 DIAGNOSIS — D696 Thrombocytopenia, unspecified: Secondary | ICD-10-CM | POA: Diagnosis not present

## 2019-07-10 DIAGNOSIS — I129 Hypertensive chronic kidney disease with stage 1 through stage 4 chronic kidney disease, or unspecified chronic kidney disease: Secondary | ICD-10-CM | POA: Diagnosis not present

## 2019-07-10 DIAGNOSIS — I482 Chronic atrial fibrillation, unspecified: Secondary | ICD-10-CM | POA: Diagnosis not present

## 2019-07-10 DIAGNOSIS — Z7901 Long term (current) use of anticoagulants: Secondary | ICD-10-CM | POA: Diagnosis not present

## 2019-07-10 DIAGNOSIS — D638 Anemia in other chronic diseases classified elsewhere: Secondary | ICD-10-CM | POA: Diagnosis not present

## 2019-07-10 DIAGNOSIS — D649 Anemia, unspecified: Secondary | ICD-10-CM | POA: Diagnosis not present

## 2019-07-10 DIAGNOSIS — E782 Mixed hyperlipidemia: Secondary | ICD-10-CM | POA: Diagnosis not present

## 2019-07-10 DIAGNOSIS — Z0001 Encounter for general adult medical examination with abnormal findings: Secondary | ICD-10-CM | POA: Diagnosis not present

## 2019-07-10 DIAGNOSIS — D631 Anemia in chronic kidney disease: Secondary | ICD-10-CM | POA: Diagnosis not present

## 2019-07-10 DIAGNOSIS — D6869 Other thrombophilia: Secondary | ICD-10-CM | POA: Diagnosis not present

## 2019-07-10 DIAGNOSIS — N1831 Chronic kidney disease, stage 3a: Secondary | ICD-10-CM | POA: Diagnosis not present

## 2019-07-13 DIAGNOSIS — N1831 Chronic kidney disease, stage 3a: Secondary | ICD-10-CM | POA: Diagnosis not present

## 2019-07-13 DIAGNOSIS — I0981 Rheumatic heart failure: Secondary | ICD-10-CM | POA: Diagnosis not present

## 2019-07-13 DIAGNOSIS — J9601 Acute respiratory failure with hypoxia: Secondary | ICD-10-CM | POA: Diagnosis not present

## 2019-07-13 DIAGNOSIS — I480 Paroxysmal atrial fibrillation: Secondary | ICD-10-CM | POA: Diagnosis not present

## 2019-07-13 DIAGNOSIS — I083 Combined rheumatic disorders of mitral, aortic and tricuspid valves: Secondary | ICD-10-CM | POA: Diagnosis not present

## 2019-07-13 DIAGNOSIS — I130 Hypertensive heart and chronic kidney disease with heart failure and stage 1 through stage 4 chronic kidney disease, or unspecified chronic kidney disease: Secondary | ICD-10-CM | POA: Diagnosis not present

## 2019-07-13 DIAGNOSIS — I5043 Acute on chronic combined systolic (congestive) and diastolic (congestive) heart failure: Secondary | ICD-10-CM | POA: Diagnosis not present

## 2019-07-13 DIAGNOSIS — J9602 Acute respiratory failure with hypercapnia: Secondary | ICD-10-CM | POA: Diagnosis not present

## 2019-07-13 DIAGNOSIS — I739 Peripheral vascular disease, unspecified: Secondary | ICD-10-CM | POA: Diagnosis not present

## 2019-07-14 DIAGNOSIS — N1831 Chronic kidney disease, stage 3a: Secondary | ICD-10-CM | POA: Diagnosis not present

## 2019-07-14 DIAGNOSIS — I5043 Acute on chronic combined systolic (congestive) and diastolic (congestive) heart failure: Secondary | ICD-10-CM | POA: Diagnosis not present

## 2019-07-14 DIAGNOSIS — I480 Paroxysmal atrial fibrillation: Secondary | ICD-10-CM | POA: Diagnosis not present

## 2019-07-14 DIAGNOSIS — I083 Combined rheumatic disorders of mitral, aortic and tricuspid valves: Secondary | ICD-10-CM | POA: Diagnosis not present

## 2019-07-14 DIAGNOSIS — J9601 Acute respiratory failure with hypoxia: Secondary | ICD-10-CM | POA: Diagnosis not present

## 2019-07-14 DIAGNOSIS — I739 Peripheral vascular disease, unspecified: Secondary | ICD-10-CM | POA: Diagnosis not present

## 2019-07-14 DIAGNOSIS — I0981 Rheumatic heart failure: Secondary | ICD-10-CM | POA: Diagnosis not present

## 2019-07-14 DIAGNOSIS — I130 Hypertensive heart and chronic kidney disease with heart failure and stage 1 through stage 4 chronic kidney disease, or unspecified chronic kidney disease: Secondary | ICD-10-CM | POA: Diagnosis not present

## 2019-07-14 DIAGNOSIS — J9602 Acute respiratory failure with hypercapnia: Secondary | ICD-10-CM | POA: Diagnosis not present

## 2019-07-16 DIAGNOSIS — I083 Combined rheumatic disorders of mitral, aortic and tricuspid valves: Secondary | ICD-10-CM | POA: Diagnosis not present

## 2019-07-16 DIAGNOSIS — I480 Paroxysmal atrial fibrillation: Secondary | ICD-10-CM | POA: Diagnosis not present

## 2019-07-16 DIAGNOSIS — I130 Hypertensive heart and chronic kidney disease with heart failure and stage 1 through stage 4 chronic kidney disease, or unspecified chronic kidney disease: Secondary | ICD-10-CM | POA: Diagnosis not present

## 2019-07-16 DIAGNOSIS — I0981 Rheumatic heart failure: Secondary | ICD-10-CM | POA: Diagnosis not present

## 2019-07-16 DIAGNOSIS — J9601 Acute respiratory failure with hypoxia: Secondary | ICD-10-CM | POA: Diagnosis not present

## 2019-07-16 DIAGNOSIS — I5043 Acute on chronic combined systolic (congestive) and diastolic (congestive) heart failure: Secondary | ICD-10-CM | POA: Diagnosis not present

## 2019-07-16 DIAGNOSIS — J9602 Acute respiratory failure with hypercapnia: Secondary | ICD-10-CM | POA: Diagnosis not present

## 2019-07-16 DIAGNOSIS — N1831 Chronic kidney disease, stage 3a: Secondary | ICD-10-CM | POA: Diagnosis not present

## 2019-07-16 DIAGNOSIS — I739 Peripheral vascular disease, unspecified: Secondary | ICD-10-CM | POA: Diagnosis not present

## 2019-07-21 DIAGNOSIS — N1831 Chronic kidney disease, stage 3a: Secondary | ICD-10-CM | POA: Diagnosis not present

## 2019-07-21 DIAGNOSIS — I0981 Rheumatic heart failure: Secondary | ICD-10-CM | POA: Diagnosis not present

## 2019-07-21 DIAGNOSIS — I083 Combined rheumatic disorders of mitral, aortic and tricuspid valves: Secondary | ICD-10-CM | POA: Diagnosis not present

## 2019-07-21 DIAGNOSIS — I480 Paroxysmal atrial fibrillation: Secondary | ICD-10-CM | POA: Diagnosis not present

## 2019-07-21 DIAGNOSIS — J9601 Acute respiratory failure with hypoxia: Secondary | ICD-10-CM | POA: Diagnosis not present

## 2019-07-21 DIAGNOSIS — I130 Hypertensive heart and chronic kidney disease with heart failure and stage 1 through stage 4 chronic kidney disease, or unspecified chronic kidney disease: Secondary | ICD-10-CM | POA: Diagnosis not present

## 2019-07-21 DIAGNOSIS — I739 Peripheral vascular disease, unspecified: Secondary | ICD-10-CM | POA: Diagnosis not present

## 2019-07-21 DIAGNOSIS — I5043 Acute on chronic combined systolic (congestive) and diastolic (congestive) heart failure: Secondary | ICD-10-CM | POA: Diagnosis not present

## 2019-07-21 DIAGNOSIS — J9602 Acute respiratory failure with hypercapnia: Secondary | ICD-10-CM | POA: Diagnosis not present

## 2019-07-23 DIAGNOSIS — I5043 Acute on chronic combined systolic (congestive) and diastolic (congestive) heart failure: Secondary | ICD-10-CM | POA: Diagnosis not present

## 2019-07-23 DIAGNOSIS — I480 Paroxysmal atrial fibrillation: Secondary | ICD-10-CM | POA: Diagnosis not present

## 2019-07-23 DIAGNOSIS — J9602 Acute respiratory failure with hypercapnia: Secondary | ICD-10-CM | POA: Diagnosis not present

## 2019-07-23 DIAGNOSIS — I0981 Rheumatic heart failure: Secondary | ICD-10-CM | POA: Diagnosis not present

## 2019-07-23 DIAGNOSIS — I083 Combined rheumatic disorders of mitral, aortic and tricuspid valves: Secondary | ICD-10-CM | POA: Diagnosis not present

## 2019-07-23 DIAGNOSIS — J9601 Acute respiratory failure with hypoxia: Secondary | ICD-10-CM | POA: Diagnosis not present

## 2019-07-23 DIAGNOSIS — N1831 Chronic kidney disease, stage 3a: Secondary | ICD-10-CM | POA: Diagnosis not present

## 2019-07-23 DIAGNOSIS — I130 Hypertensive heart and chronic kidney disease with heart failure and stage 1 through stage 4 chronic kidney disease, or unspecified chronic kidney disease: Secondary | ICD-10-CM | POA: Diagnosis not present

## 2019-07-23 DIAGNOSIS — I739 Peripheral vascular disease, unspecified: Secondary | ICD-10-CM | POA: Diagnosis not present

## 2019-07-24 ENCOUNTER — Other Ambulatory Visit: Payer: Self-pay

## 2019-07-24 ENCOUNTER — Other Ambulatory Visit (HOSPITAL_COMMUNITY): Payer: Self-pay | Admitting: Internal Medicine

## 2019-07-24 ENCOUNTER — Ambulatory Visit (HOSPITAL_COMMUNITY)
Admission: RE | Admit: 2019-07-24 | Discharge: 2019-07-24 | Disposition: A | Discharge status: Home / Self Care | Payer: Medicare HMO | Source: Ambulatory Visit | Attending: Internal Medicine | Admitting: Internal Medicine

## 2019-07-24 DIAGNOSIS — I482 Chronic atrial fibrillation, unspecified: Secondary | ICD-10-CM | POA: Diagnosis not present

## 2019-07-24 DIAGNOSIS — R062 Wheezing: Secondary | ICD-10-CM | POA: Insufficient documentation

## 2019-07-24 DIAGNOSIS — R0602 Shortness of breath: Secondary | ICD-10-CM | POA: Insufficient documentation

## 2019-07-24 DIAGNOSIS — Z7901 Long term (current) use of anticoagulants: Secondary | ICD-10-CM | POA: Diagnosis not present

## 2019-07-24 DIAGNOSIS — I5043 Acute on chronic combined systolic (congestive) and diastolic (congestive) heart failure: Secondary | ICD-10-CM | POA: Diagnosis not present

## 2019-07-27 DIAGNOSIS — R0602 Shortness of breath: Secondary | ICD-10-CM | POA: Diagnosis not present

## 2019-07-27 DIAGNOSIS — I5043 Acute on chronic combined systolic (congestive) and diastolic (congestive) heart failure: Secondary | ICD-10-CM | POA: Diagnosis not present

## 2019-07-28 DIAGNOSIS — J9602 Acute respiratory failure with hypercapnia: Secondary | ICD-10-CM | POA: Diagnosis not present

## 2019-07-28 DIAGNOSIS — I0981 Rheumatic heart failure: Secondary | ICD-10-CM | POA: Diagnosis not present

## 2019-07-28 DIAGNOSIS — N1831 Chronic kidney disease, stage 3a: Secondary | ICD-10-CM | POA: Diagnosis not present

## 2019-07-28 DIAGNOSIS — I480 Paroxysmal atrial fibrillation: Secondary | ICD-10-CM | POA: Diagnosis not present

## 2019-07-28 DIAGNOSIS — I5043 Acute on chronic combined systolic (congestive) and diastolic (congestive) heart failure: Secondary | ICD-10-CM | POA: Diagnosis not present

## 2019-07-28 DIAGNOSIS — J9601 Acute respiratory failure with hypoxia: Secondary | ICD-10-CM | POA: Diagnosis not present

## 2019-07-28 DIAGNOSIS — I130 Hypertensive heart and chronic kidney disease with heart failure and stage 1 through stage 4 chronic kidney disease, or unspecified chronic kidney disease: Secondary | ICD-10-CM | POA: Diagnosis not present

## 2019-07-28 DIAGNOSIS — I739 Peripheral vascular disease, unspecified: Secondary | ICD-10-CM | POA: Diagnosis not present

## 2019-07-28 DIAGNOSIS — I083 Combined rheumatic disorders of mitral, aortic and tricuspid valves: Secondary | ICD-10-CM | POA: Diagnosis not present

## 2019-07-29 DIAGNOSIS — I739 Peripheral vascular disease, unspecified: Secondary | ICD-10-CM | POA: Diagnosis not present

## 2019-07-29 DIAGNOSIS — J9601 Acute respiratory failure with hypoxia: Secondary | ICD-10-CM | POA: Diagnosis not present

## 2019-07-29 DIAGNOSIS — I480 Paroxysmal atrial fibrillation: Secondary | ICD-10-CM | POA: Diagnosis not present

## 2019-07-29 DIAGNOSIS — I5043 Acute on chronic combined systolic (congestive) and diastolic (congestive) heart failure: Secondary | ICD-10-CM | POA: Diagnosis not present

## 2019-07-29 DIAGNOSIS — I0981 Rheumatic heart failure: Secondary | ICD-10-CM | POA: Diagnosis not present

## 2019-07-29 DIAGNOSIS — I130 Hypertensive heart and chronic kidney disease with heart failure and stage 1 through stage 4 chronic kidney disease, or unspecified chronic kidney disease: Secondary | ICD-10-CM | POA: Diagnosis not present

## 2019-07-29 DIAGNOSIS — J9602 Acute respiratory failure with hypercapnia: Secondary | ICD-10-CM | POA: Diagnosis not present

## 2019-07-29 DIAGNOSIS — N1831 Chronic kidney disease, stage 3a: Secondary | ICD-10-CM | POA: Diagnosis not present

## 2019-07-29 DIAGNOSIS — I083 Combined rheumatic disorders of mitral, aortic and tricuspid valves: Secondary | ICD-10-CM | POA: Diagnosis not present

## 2019-07-30 DIAGNOSIS — I083 Combined rheumatic disorders of mitral, aortic and tricuspid valves: Secondary | ICD-10-CM | POA: Diagnosis not present

## 2019-07-30 DIAGNOSIS — J9601 Acute respiratory failure with hypoxia: Secondary | ICD-10-CM | POA: Diagnosis not present

## 2019-07-30 DIAGNOSIS — I480 Paroxysmal atrial fibrillation: Secondary | ICD-10-CM | POA: Diagnosis not present

## 2019-07-30 DIAGNOSIS — I130 Hypertensive heart and chronic kidney disease with heart failure and stage 1 through stage 4 chronic kidney disease, or unspecified chronic kidney disease: Secondary | ICD-10-CM | POA: Diagnosis not present

## 2019-07-30 DIAGNOSIS — N1831 Chronic kidney disease, stage 3a: Secondary | ICD-10-CM | POA: Diagnosis not present

## 2019-07-30 DIAGNOSIS — J9602 Acute respiratory failure with hypercapnia: Secondary | ICD-10-CM | POA: Diagnosis not present

## 2019-07-30 DIAGNOSIS — I5043 Acute on chronic combined systolic (congestive) and diastolic (congestive) heart failure: Secondary | ICD-10-CM | POA: Diagnosis not present

## 2019-07-30 DIAGNOSIS — I0981 Rheumatic heart failure: Secondary | ICD-10-CM | POA: Diagnosis not present

## 2019-07-30 DIAGNOSIS — I739 Peripheral vascular disease, unspecified: Secondary | ICD-10-CM | POA: Diagnosis not present

## 2019-08-04 DIAGNOSIS — I5043 Acute on chronic combined systolic (congestive) and diastolic (congestive) heart failure: Secondary | ICD-10-CM | POA: Diagnosis not present

## 2019-08-04 DIAGNOSIS — I480 Paroxysmal atrial fibrillation: Secondary | ICD-10-CM | POA: Diagnosis not present

## 2019-08-04 DIAGNOSIS — J9602 Acute respiratory failure with hypercapnia: Secondary | ICD-10-CM | POA: Diagnosis not present

## 2019-08-04 DIAGNOSIS — N1831 Chronic kidney disease, stage 3a: Secondary | ICD-10-CM | POA: Diagnosis not present

## 2019-08-04 DIAGNOSIS — I130 Hypertensive heart and chronic kidney disease with heart failure and stage 1 through stage 4 chronic kidney disease, or unspecified chronic kidney disease: Secondary | ICD-10-CM | POA: Diagnosis not present

## 2019-08-04 DIAGNOSIS — I739 Peripheral vascular disease, unspecified: Secondary | ICD-10-CM | POA: Diagnosis not present

## 2019-08-04 DIAGNOSIS — J9601 Acute respiratory failure with hypoxia: Secondary | ICD-10-CM | POA: Diagnosis not present

## 2019-08-04 DIAGNOSIS — I083 Combined rheumatic disorders of mitral, aortic and tricuspid valves: Secondary | ICD-10-CM | POA: Diagnosis not present

## 2019-08-04 DIAGNOSIS — I0981 Rheumatic heart failure: Secondary | ICD-10-CM | POA: Diagnosis not present

## 2019-08-10 DIAGNOSIS — Z7901 Long term (current) use of anticoagulants: Secondary | ICD-10-CM | POA: Diagnosis not present

## 2019-08-10 DIAGNOSIS — I482 Chronic atrial fibrillation, unspecified: Secondary | ICD-10-CM | POA: Diagnosis not present

## 2019-08-26 DIAGNOSIS — I130 Hypertensive heart and chronic kidney disease with heart failure and stage 1 through stage 4 chronic kidney disease, or unspecified chronic kidney disease: Secondary | ICD-10-CM | POA: Diagnosis not present

## 2019-08-26 DIAGNOSIS — D649 Anemia, unspecified: Secondary | ICD-10-CM | POA: Diagnosis not present

## 2019-08-26 DIAGNOSIS — G14 Postpolio syndrome: Secondary | ICD-10-CM | POA: Diagnosis not present

## 2019-08-26 DIAGNOSIS — Z7901 Long term (current) use of anticoagulants: Secondary | ICD-10-CM | POA: Diagnosis not present

## 2019-08-26 DIAGNOSIS — N183 Chronic kidney disease, stage 3 unspecified: Secondary | ICD-10-CM | POA: Diagnosis not present

## 2019-08-26 DIAGNOSIS — R7301 Impaired fasting glucose: Secondary | ICD-10-CM | POA: Diagnosis not present

## 2019-08-26 DIAGNOSIS — I5032 Chronic diastolic (congestive) heart failure: Secondary | ICD-10-CM | POA: Diagnosis not present

## 2019-08-26 DIAGNOSIS — Z8673 Personal history of transient ischemic attack (TIA), and cerebral infarction without residual deficits: Secondary | ICD-10-CM | POA: Diagnosis not present

## 2019-09-07 DIAGNOSIS — Z7901 Long term (current) use of anticoagulants: Secondary | ICD-10-CM | POA: Diagnosis not present

## 2019-09-07 DIAGNOSIS — I482 Chronic atrial fibrillation, unspecified: Secondary | ICD-10-CM | POA: Diagnosis not present

## 2019-09-07 DIAGNOSIS — J019 Acute sinusitis, unspecified: Secondary | ICD-10-CM | POA: Diagnosis not present

## 2019-09-15 ENCOUNTER — Other Ambulatory Visit: Payer: Self-pay

## 2019-09-15 ENCOUNTER — Emergency Department (HOSPITAL_COMMUNITY): Payer: Medicare HMO

## 2019-09-15 ENCOUNTER — Emergency Department (HOSPITAL_COMMUNITY)
Admission: EM | Admit: 2019-09-15 | Discharge: 2019-09-15 | Disposition: A | Discharge status: Home / Self Care | Payer: Medicare HMO | Attending: Emergency Medicine | Admitting: Emergency Medicine

## 2019-09-15 ENCOUNTER — Encounter (HOSPITAL_COMMUNITY): Payer: Self-pay

## 2019-09-15 DIAGNOSIS — I110 Hypertensive heart disease with heart failure: Secondary | ICD-10-CM | POA: Diagnosis not present

## 2019-09-15 DIAGNOSIS — Z8673 Personal history of transient ischemic attack (TIA), and cerebral infarction without residual deficits: Secondary | ICD-10-CM | POA: Diagnosis not present

## 2019-09-15 DIAGNOSIS — Z7951 Long term (current) use of inhaled steroids: Secondary | ICD-10-CM | POA: Insufficient documentation

## 2019-09-15 DIAGNOSIS — Z79899 Other long term (current) drug therapy: Secondary | ICD-10-CM | POA: Diagnosis not present

## 2019-09-15 DIAGNOSIS — I5020 Unspecified systolic (congestive) heart failure: Secondary | ICD-10-CM | POA: Diagnosis not present

## 2019-09-15 DIAGNOSIS — I2510 Atherosclerotic heart disease of native coronary artery without angina pectoris: Secondary | ICD-10-CM | POA: Insufficient documentation

## 2019-09-15 DIAGNOSIS — R0600 Dyspnea, unspecified: Secondary | ICD-10-CM | POA: Diagnosis not present

## 2019-09-15 DIAGNOSIS — Z87891 Personal history of nicotine dependence: Secondary | ICD-10-CM | POA: Diagnosis not present

## 2019-09-15 DIAGNOSIS — Z7901 Long term (current) use of anticoagulants: Secondary | ICD-10-CM | POA: Insufficient documentation

## 2019-09-15 DIAGNOSIS — Z951 Presence of aortocoronary bypass graft: Secondary | ICD-10-CM | POA: Insufficient documentation

## 2019-09-15 DIAGNOSIS — Z85828 Personal history of other malignant neoplasm of skin: Secondary | ICD-10-CM | POA: Diagnosis not present

## 2019-09-15 DIAGNOSIS — Z96652 Presence of left artificial knee joint: Secondary | ICD-10-CM | POA: Diagnosis not present

## 2019-09-15 DIAGNOSIS — I517 Cardiomegaly: Secondary | ICD-10-CM | POA: Diagnosis not present

## 2019-09-15 DIAGNOSIS — Z792 Long term (current) use of antibiotics: Secondary | ICD-10-CM | POA: Diagnosis not present

## 2019-09-15 DIAGNOSIS — R531 Weakness: Secondary | ICD-10-CM | POA: Diagnosis not present

## 2019-09-15 DIAGNOSIS — R0602 Shortness of breath: Secondary | ICD-10-CM | POA: Diagnosis not present

## 2019-09-15 DIAGNOSIS — J811 Chronic pulmonary edema: Secondary | ICD-10-CM | POA: Diagnosis not present

## 2019-09-15 DIAGNOSIS — J9811 Atelectasis: Secondary | ICD-10-CM | POA: Diagnosis not present

## 2019-09-15 DIAGNOSIS — I509 Heart failure, unspecified: Secondary | ICD-10-CM | POA: Diagnosis not present

## 2019-09-15 LAB — CBC WITH DIFFERENTIAL/PLATELET
Abs Immature Granulocytes: 0.07 10*3/uL (ref 0.00–0.07)
Basophils Absolute: 0.1 10*3/uL (ref 0.0–0.1)
Basophils Relative: 1 %
Eosinophils Absolute: 0.3 10*3/uL (ref 0.0–0.5)
Eosinophils Relative: 4 %
HCT: 38.9 % — ABNORMAL LOW (ref 39.0–52.0)
Hemoglobin: 12.4 g/dL — ABNORMAL LOW (ref 13.0–17.0)
Immature Granulocytes: 1 %
Lymphocytes Relative: 9 %
Lymphs Abs: 0.6 10*3/uL — ABNORMAL LOW (ref 0.7–4.0)
MCH: 30.7 pg (ref 26.0–34.0)
MCHC: 31.9 g/dL (ref 30.0–36.0)
MCV: 96.3 fL (ref 80.0–100.0)
Monocytes Absolute: 0.4 10*3/uL (ref 0.1–1.0)
Monocytes Relative: 6 %
Neutro Abs: 5.4 10*3/uL (ref 1.7–7.7)
Neutrophils Relative %: 79 %
Platelets: 141 10*3/uL — ABNORMAL LOW (ref 150–400)
RBC: 4.04 MIL/uL — ABNORMAL LOW (ref 4.22–5.81)
RDW: 13.3 % (ref 11.5–15.5)
WBC: 6.8 10*3/uL (ref 4.0–10.5)
nRBC: 0 % (ref 0.0–0.2)

## 2019-09-15 LAB — BASIC METABOLIC PANEL
Anion gap: 8 (ref 5–15)
BUN: 23 mg/dL (ref 8–23)
CO2: 30 mmol/L (ref 22–32)
Calcium: 8.8 mg/dL — ABNORMAL LOW (ref 8.9–10.3)
Chloride: 102 mmol/L (ref 98–111)
Creatinine, Ser: 0.89 mg/dL (ref 0.61–1.24)
GFR calc Af Amer: >60 mL/min (ref >60)
GFR calc non Af Amer: >60 mL/min (ref >60)
Glucose, Bld: 124 mg/dL — ABNORMAL HIGH (ref 70–99)
Potassium: 4.1 mmol/L (ref 3.5–5.1)
Sodium: 140 mmol/L (ref 135–145)

## 2019-09-15 LAB — TROPONIN I (HIGH SENSITIVITY)
Troponin I (High Sensitivity): 18 ng/L — ABNORMAL HIGH (ref <18)
Troponin I (High Sensitivity): 18 ng/L — ABNORMAL HIGH (ref <18)

## 2019-09-15 LAB — HEPATIC FUNCTION PANEL
ALT: 20 U/L (ref 0–44)
AST: 19 U/L (ref 15–41)
Albumin: 4.1 g/dL (ref 3.5–5.0)
Alkaline Phosphatase: 45 U/L (ref 38–126)
Bilirubin, Direct: <0.1 mg/dL (ref 0.0–0.2)
Total Bilirubin: 0.5 mg/dL (ref 0.3–1.2)
Total Protein: 7.2 g/dL (ref 6.5–8.1)

## 2019-09-15 LAB — LACTIC ACID, PLASMA: Lactic Acid, Venous: 1.3 mmol/L (ref 0.5–1.9)

## 2019-09-15 LAB — BRAIN NATRIURETIC PEPTIDE: B Natriuretic Peptide: 452 pg/mL — ABNORMAL HIGH (ref 0.0–100.0)

## 2019-09-15 MED ORDER — FUROSEMIDE 10 MG/ML IJ SOLN
40.0000 mg | Freq: Once | INTRAMUSCULAR | Status: AC
  Administered 2019-09-15: 40 mg via INTRAVENOUS
  Filled 2019-09-15: qty 4

## 2019-09-15 MED ORDER — IOHEXOL 350 MG/ML SOLN
100.0000 mL | Freq: Once | INTRAVENOUS | Status: AC | PRN
  Administered 2019-09-15: 100 mL via INTRAVENOUS

## 2019-09-15 NOTE — Discharge Instructions (Addendum)
Increase your Lasix so you are taking 40 mg twice a day.  Follow-up with either your family doctor or your cardiologist the end of this week or beginning next week

## 2019-09-15 NOTE — ED Provider Notes (Signed)
[facility] EMERGENCY DEPARTMENT Provider Note   CSN: [id number] Arrival date & time: 09/15/19  1500     History Chief Complaint  Patient presents with  . Shortness of Breath    Benjamin Cordova is a 85 y.o. male.  Patient complains of shortness of breath.  Patient has history of heart failure  The history is provided by the patient. No language interpreter was used.  Shortness of Breath Severity:  Mild Onset quality:  Gradual Timing:  Intermittent Progression:  Waxing and waning Chronicity:  New Context: activity   Relieved by:  Nothing Worsened by:  Nothing Ineffective treatments:  None tried Associated symptoms: no abdominal pain, no chest pain, no cough, no headaches and no rash   Risk factors: no recent alcohol use        Past Medical History:  Diagnosis Date  . Arthritis   . Coronary artery disease    Multivessel s/p CABG [facility] at [facility]  . Essential hypertension   . Hematuria   . History of blood transfusion 07/2015   Knee replacement HgB <9  . History of pneumonia   . History of stroke   . Ischemic cardiomyopathy   . Mixed hyperlipidemia   . Myocardial infarction ([place]) 1980  . Peripheral vascular disease ([place])   . 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 Skin cancer   . TIA (transient ischemic attack) 1989   Chronic coumadin  . UTI (lower urinary tract infection)     Patient Active Problem List   Diagnosis Date Noted  . AKI (acute kidney injury) ([place])   . Weakness 06/30/2019  . Shortness of breath 06/30/2019  . Subtherapeutic anticoagulation   . Acute on chronic respiratory failure with hypoxia and hypercapnia (HCC)   . Acute on chronic systolic HF (heart failure) ([place])   . LV (left ventricular) mural thrombus 09/03/2015  . Systolic and diastolic CHF, acute on chronic ([place])   . Hypokalemia   . CHF (congestive heart failure) ([place]) 09/01/2015  . Respiratory failure with hypoxia ([place]) 08/31/2015  . HCAP  (healthcare-associated pneumonia) 08/31/2015  . Acute respiratory failure with hypoxia and hypercapnia (HCC)   . Encounter for central line placement   . Knee swelling   . Subtherapeutic international normalized ratio (INR) 08/08/2015  . Constipation 08/08/2015  . Hyperlipidemia 08/08/2015  . Blurred vision 07/29/2015  . Anemia 07/29/2015  . S/P total knee replacement 07/27/2015  . S/P CABG x 2 10/08/2014  . Chest pain 10/01/2014  . Elevated INR 10/01/2014  . Leukocytosis 10/01/2014  . Thrombocytopenia ([place]) 10/01/2014  . Chest pain at rest 10/01/2014  . Unstable angina ([place]) 10/01/2014  . Coagulopathy ([place]) 10/01/2014  . Paroxysmal atrial fibrillation ([place]) 10/01/2014  . Coronary artery disease   . Hypertension   . Arthritis   . Peripheral vascular disease ([place])   . Essential hypertension     Past Surgical History:  Procedure Laterality Date  . CARDIAC CATHETERIZATION N/A 10/05/2014   Procedure: Left Heart Cath and Coronary Angiography;  Surgeon: Peter M Martinique, MD;  Location: [place] [facility];  Service: Cardiovascular;  Laterality: N/A;  . CORONARY ARTERY BYPASS GRAFT  1981   [facility]  . CORONARY ARTERY BYPASS GRAFT N/A 10/08/2014   Procedure: REDO CORONARY ARTERY BYPASS GRAFTING (CABG) Times Two Grafts using Left Internal Mammary and Left Radial Artery;  Surgeon: Melrose Nakayama, MD;  Location: [place];  Service: Open Heart Surgery;  Laterality: N/A;  .  CYSTOSCOPY WITH INJECTION  10/10/2010   Procedure: CYSTOSCOPY WITH INJECTION;  Surgeon: Marissa Nestle;  Location: [facility];  Service: Urology;  Laterality: N/A;  with retrograde urethrogram  . FOOT SURGERY Right 1954   [facility], muscle implantation  . JOINT REPLACEMENT    . RADIAL ARTERY HARVEST Left 10/08/2014   Procedure: Left RADIAL ARTERY HARVEST;  Surgeon: Melrose Nakayama, MD;  Location: [place];  Service: Open Heart Surgery;  Laterality: Left;  . TEE WITHOUT CARDIOVERSION N/A 10/08/2014   Procedure:  TRANSESOPHAGEAL ECHOCARDIOGRAM (TEE);  Surgeon: Melrose Nakayama, MD;  Location: [place];  Service: Open Heart Surgery;  Laterality: N/A;  . TONSILLECTOMY  1949   [facility]  . TOTAL KNEE ARTHROPLASTY Left 07/27/2015   Procedure: TOTAL LEFT KNEE ARTHROPLASTY;  Surgeon: [name], MD;  Location: [place];  Service: Orthopedics;  Laterality: Left;       Family History  Problem Relation Age of Onset  . Heart attack Mother   . Diabetes Mother   . Heart attack Father   . Heart attack Brother   . Anesthesia problems Neg Hx   . Hypotension Neg Hx   . Malignant hyperthermia Neg Hx   . Pseudochol deficiency Neg Hx     Social History   Tobacco Use  . Smoking status: Former Smoker    Packs/day: 1.00    Years: 28.00    Pack years: 28.00    Types: Cigarettes    Quit date: 09/22/1978    Years since quitting: 41.0  . Smokeless tobacco: Never Used  Vaping Use  . Vaping Use: Never used  Substance Use Topics  . Alcohol use: No    Alcohol/week: 0.0 standard drinks  . Drug use: No    Home Medications Prior to Admission medications   Medication Sig Start Date End Date Taking? Authorizing Provider  albuterol (PROVENTIL) (2.5 MG/3ML) 0.083% nebulizer solution Take 2.5 mg by nebulization every 6 (six) hours as needed for wheezing or shortness of breath.  08/29/19  Yes [provider]  amoxicillin-clavulanate (AUGMENTIN) 875-125 MG tablet Take 1 tablet by mouth 2 (two) times daily. 7 day course starting on 09/08/2019 09/08/19  Yes [provider]  B Complex-C-Folic Acid (SUPER B-COMPLEX/VIT C/FA) TABS Take 1 tablet by mouth daily.     Yes [provider]  carvedilol (COREG) 6.25 MG tablet Take 1 tablet (6.25 mg total) by mouth 2 (two) times daily with a meal. 07/04/19  Yes Barton Dubois, MD  diphenhydramine-acetaminophen (TYLENOL PM) 25-500 MG TABS tablet Take 2 tablets by mouth at bedtime.    Yes [provider]  ferrous [id number] C-folic acid (TRINSICON /  FOLTRIN) capsule Take 1 capsule by mouth daily.    Yes [provider]  finasteride (PROSCAR) 5 MG tablet Take 5 mg by mouth daily. 04/20/19  Yes [provider]  folic acid (FOLVITE) 397 MCG tablet Take 400 mcg by mouth daily.    Yes [provider]  furosemide (LASIX) 20 MG tablet Take 1 tablet (20 mg total) by mouth 2 (two) times daily. Patient taking differently: Take 60 mg by mouth daily.  07/04/19  Yes Barton Dubois, MD  Krill Oil (OMEGA-3) 500 MG CAPS Take 1 capsule by mouth at bedtime.    Yes [provider]  Multiple Vitamins-Minerals (CERTAVITE/ANTIOXIDANTS) TABS Take 1 tablet by mouth daily.    Yes [provider]  potassium chloride (K-DUR) 10 MEQ tablet Take 10 mEq by mouth daily. Take with Lasix (Furosemide) 03/18/18  Yes [provider]  sacubitril-valsartan (ENTRESTO) 24-26 MG Take 1 tablet by mouth 2 (two) times daily. 07/04/19  Yes Barton Dubois, MD  simvastatin (ZOCOR) 40 MG tablet Take 40 mg by mouth at bedtime.    Yes [provider]  tamsulosin (FLOMAX) 0.4 MG CAPS capsule Take 0.4 mg by mouth in the morning.  12/02/18  Yes [provider]  vitamin C (ASCORBIC ACID) 500 MG tablet Take 1,000 mg by mouth 2 (two) times daily.    Yes [provider]  warfarin (COUMADIN) 5 MG tablet Take 1.5 tablets (7.5 mg total) by mouth daily at 6 PM. OR AS DIRECTED by MD. Patient taking differently: Take 5-10 mg by mouth See admin instructions. Take 10 mg on Sun, Monday, Wednesdays Take 5 mg on all other days 09/07/15  Yes Hongalgi, Lenis Dickinson, MD    Allergies    Patient has no known allergies.  Review of Systems   Review of Systems  Constitutional: Negative for appetite change and fatigue.  HENT: Negative for congestion, ear discharge and sinus pressure.   Eyes: Negative for discharge.  Respiratory: Positive for shortness of breath. Negative for cough.   Cardiovascular: Negative for chest pain.  Gastrointestinal:  Negative for abdominal pain and diarrhea.  Genitourinary: Negative for frequency and hematuria.  Musculoskeletal: Negative for back pain.  Skin: Negative for rash.  Neurological: Negative for seizures and headaches.  Psychiatric/Behavioral: Negative for hallucinations.    Physical Exam Updated Vital Signs BP 129/72   Pulse 75   Temp 97.7 F (36.5 C) (Oral)   Resp 17   Ht 5\' 8"  (1.727 m)   Wt 111.1 kg   SpO2 94%   BMI 37.25 kg/m   Physical Exam Vitals and nursing note reviewed.  Constitutional:      Appearance: He is well-developed.  HENT:     Head: Normocephalic.     Nose: Nose normal.  Eyes:     General: No scleral icterus.    Conjunctiva/sclera: Conjunctivae normal.  Neck:     Thyroid: No thyromegaly.  Cardiovascular:     Rate and Rhythm: Normal rate and regular rhythm.     Heart sounds: No murmur heard.  No friction rub. No gallop.   Pulmonary:     Breath sounds: No stridor. No wheezing or rales.  Chest:     Chest wall: No tenderness.  Abdominal:     General: There is no distension.     Tenderness: There is no abdominal tenderness. There is no rebound.  Musculoskeletal:        General: Normal range of motion.     Cervical back: Neck supple.     Comments: Edema in legs  Lymphadenopathy:     Cervical: No cervical adenopathy.  Skin:    Findings: No erythema or rash.  Neurological:     Mental Status: He is alert and oriented to person, place, and time.     Motor: No abnormal muscle tone.     Coordination: Coordination normal.  Psychiatric:        Behavior: Behavior normal.     ED Results / Procedures / Treatments   Labs (all labs ordered are listed, but only abnormal results are displayed) Labs Reviewed  CBC WITH DIFFERENTIAL/PLATELET - Abnormal; Notable for the following components:      Result Value   RBC 4.04 (*)    Hemoglobin 12.4 (*)    HCT 38.9 (*)    Platelets 141 (*)    Lymphs Abs  0.6 (*)    All other components within normal limits    BASIC METABOLIC PANEL - Abnormal; Notable for the following components:   Glucose, Bld 124 (*)    Calcium 8.8 (*)    All other components within normal limits  BRAIN NATRIURETIC PEPTIDE - Abnormal; Notable for the following components:   B Natriuretic Peptide 452.0 (*)    All other components within normal limits  TROPONIN I (HIGH SENSITIVITY) - Abnormal; Notable for the following components:   Troponin I (High Sensitivity) 18 (*)    All other components within normal limits  TROPONIN I (HIGH SENSITIVITY) - Abnormal; Notable for the following components:   Troponin I (High Sensitivity) 18 (*)    All other components within normal limits  HEPATIC FUNCTION PANEL  LACTIC ACID, PLASMA    EKG EKG Interpretation  Date/Time:  Tuesday September 15 2019 15:21:53 EDT Ventricular Rate:  94 PR Interval:    QRS Duration: 111 QT Interval:  353 QTC Calculation: 442 R Axis:   41 Text Interpretation: Sinus rhythm Multiform ventricular premature complexes Anterior infarct, old Nonspecific T abnormalities, lateral leads Confirmed by Milton Ferguson [phone]) on 09/15/2019 4:54:55 PM   Radiology CT Angio Chest PE W and/or Wo Contrast  Result Date: 09/15/2019 CLINICAL DATA:  Shortness of breath EXAM: CT ANGIOGRAPHY CHEST WITH CONTRAST TECHNIQUE: Multidetector CT imaging of the chest was performed using the standard protocol during bolus administration of intravenous contrast. Multiplanar CT image reconstructions and MIPs were obtained to evaluate the vascular anatomy. CONTRAST:  142mL OMNIPAQUE IOHEXOL 350 MG/ML SOLN COMPARISON:  06/30/2019 FINDINGS: Cardiovascular: No filling defects in the pulmonary arteries to suggest pulmonary emboli. Heart is mildly enlarged. Aorta normal caliber. Moderate aortic atherosclerosis. Prior CABG. Mediastinum/Nodes: Right infrahilar/subcarinal lymph node measures 14 mm, unchanged. Prominent right hilar lymph nodes measure up to 12 mm, unchanged. Right paratracheal lymph node has  a short axis diameter of 9 mm, also stable. No axillary adenopathy. Lungs/Pleura: Areas of scarring and rounded atelectasis throughout the left lung. Small loculated posterior left pleural effusion. Stable appearance since prior study. Mild centrilobular and paraseptal emphysema. No confluent opacity or effusion on the right. Upper Abdomen: Imaging into the upper abdomen shows no acute findings. Musculoskeletal: Mild bilateral gynecomastia, stable. No acute bony abnormality. Review of the MIP images confirms the above findings. IMPRESSION: No evidence of pulmonary embolus. Borderline sized mediastinal and hilar lymph nodes, stable. Stable chronic areas of subpleural scarring and rounded atelectasis the left lung. Stable small loculated posterior left pleural effusion. Cardiomegaly. Aortic Atherosclerosis (ICD10-I70.0) and Emphysema (ICD10-J43.9). Electronically Signed   By: Rolm Baptise M.D.   On: 09/15/2019 20:01   DG Chest Portable 1 View  Result Date: 09/15/2019 CLINICAL DATA:  Increased shortness of breath and weakness. History of CHF. EXAM: PORTABLE CHEST 1 VIEW COMPARISON:  07/24/2019 FINDINGS: Patient rotated minimally left. Midline trachea. Mild cardiomegaly. Atherosclerosis in the transverse aorta. Prior median sternotomy. Left-sided pleural thickening and mild fluid, blunting the left costophrenic angle, similar. No pneumothorax. Diffuse interstitial prominence, increased and accentuated by decreased lung volumes. Lateral left upper lobe area left base opacity, likely atelectasis. IMPRESSION: No change since 07/24/2019 Left sided pleuroparenchymal opacity, likely due to pleural fluid and thickening with concurrent rounded atelectasis, as on the 06/30/2019 CT. Progressive interstitial thickening, possibly representing mild pulmonary venous congestion. Electronically Signed   By: Abigail Miyamoto M.D.   On: 09/15/2019 15:49    Procedures Procedures (including critical care time)  Medications Ordered in  ED Medications  furosemide (LASIX) injection 40 mg (40 mg Intravenous Given 09/15/19 1841)  iohexol (OMNIPAQUE) 350 MG/ML injection 100 mL (100 mLs Intravenous Contrast Given 09/15/19 1954)    ED Course  I have reviewed the triage vital signs and the nursing notes.  Pertinent labs & imaging results that were available during my care of the patient were reviewed by me and considered in my medical decision making (see chart for details).    MDM Rules/Calculators/A&P                          Patient with congestive heart failure.  Patient improved with IV Lasix.  We will increase his Lasix so he is taking 40 mg twice a day and he will follow-up with his family doctor or cardiologist        ,This patient presents to the ED for concern of shortness of breath, this involves an extensive number of treatment options, and is a complaint that carries with it a high risk of complications and morbidity.  The differential diagnosis includes pneumonia congestive heart failure   Lab Tests:   I Ordered, reviewed, and interpreted labs, which included CBC chemistries BNP which shows mild anemia and elevated BNP  Medicines ordered:   I ordered medication Lasix for diuresis  Imaging Studies ordered:   I ordered imaging studies which included chest x-ray and CT angio  I independently visualized and interpreted imaging which showed CT angio negative for PE.  Mild fluid in lungs  Additional history obtained:   Additional history obtained from relative  Previous records obtained and reviewed   Consultations Obtained:     Reevaluation:  After the interventions stated above, I reevaluated the patient and found improved  Critical Interventions:  .   Final Clinical Impression(s) / ED Diagnoses Final diagnoses:  Dyspnea, unspecified type  Systolic congestive heart failure, unspecified HF chronicity ([place])    Rx / DC Orders ED Discharge Orders    None       Milton Ferguson,  MD 09/16/19 1116

## 2019-09-15 NOTE — ED Triage Notes (Signed)
EMS reports pt saw Dr. Nevada Crane last week for congestion and was put on an antibiotic.  Reports recently changed his fluid pill to lasix say feels like he has some fluid on his abd.  Reports called ems because pt sob with exertion.  Pt no on home o2.  EMS says room air o2 sat was 96%.  Pt alert and oriented, lives alone.

## 2019-09-22 DIAGNOSIS — D649 Anemia, unspecified: Secondary | ICD-10-CM | POA: Diagnosis not present

## 2019-09-22 DIAGNOSIS — Z0001 Encounter for general adult medical examination with abnormal findings: Secondary | ICD-10-CM | POA: Diagnosis not present

## 2019-09-22 DIAGNOSIS — D638 Anemia in other chronic diseases classified elsewhere: Secondary | ICD-10-CM | POA: Diagnosis not present

## 2019-09-22 DIAGNOSIS — D6869 Other thrombophilia: Secondary | ICD-10-CM | POA: Diagnosis not present

## 2019-09-22 DIAGNOSIS — D631 Anemia in chronic kidney disease: Secondary | ICD-10-CM | POA: Diagnosis not present

## 2019-09-22 DIAGNOSIS — D696 Thrombocytopenia, unspecified: Secondary | ICD-10-CM | POA: Diagnosis not present

## 2019-09-22 DIAGNOSIS — E782 Mixed hyperlipidemia: Secondary | ICD-10-CM | POA: Diagnosis not present

## 2019-09-22 DIAGNOSIS — I5032 Chronic diastolic (congestive) heart failure: Secondary | ICD-10-CM | POA: Diagnosis not present

## 2019-09-22 DIAGNOSIS — Z0000 Encounter for general adult medical examination without abnormal findings: Secondary | ICD-10-CM | POA: Diagnosis not present

## 2019-09-22 DIAGNOSIS — D509 Iron deficiency anemia, unspecified: Secondary | ICD-10-CM | POA: Diagnosis not present

## 2019-09-28 ENCOUNTER — Encounter: Payer: Self-pay | Admitting: Cardiology

## 2019-09-28 ENCOUNTER — Other Ambulatory Visit: Payer: Self-pay

## 2019-09-28 ENCOUNTER — Ambulatory Visit: Payer: Medicare HMO | Admitting: Cardiology

## 2019-09-28 VITALS — BP 108/68 | HR 104 | Ht 68.0 in | Wt 249.0 lb

## 2019-09-28 DIAGNOSIS — I255 Ischemic cardiomyopathy: Secondary | ICD-10-CM | POA: Diagnosis not present

## 2019-09-28 DIAGNOSIS — I25119 Atherosclerotic heart disease of native coronary artery with unspecified angina pectoris: Secondary | ICD-10-CM | POA: Diagnosis not present

## 2019-09-28 DIAGNOSIS — I513 Intracardiac thrombosis, not elsewhere classified: Secondary | ICD-10-CM

## 2019-09-28 NOTE — Patient Instructions (Signed)
Medication Instructions:  Your physician recommends that you continue on your current medications as directed. Please refer to the Current Medication list given to you today.  *If you need a refill on your cardiac medications before your next appointment, please call your pharmacy*   Lab Work: None today If you have labs (blood work) drawn today and your tests are completely normal, you will receive your results only by: Marland Kitchen MyChart Message (if you have MyChart) OR . A paper copy in the mail If you have any lab test that is abnormal or we need to change your treatment, we will call you to review the results.   Testing/Procedures: None today   Follow-Up: At [facility], you and your health needs are our priority.  As part of our continuing mission to provide you with exceptional heart care, we have created designated Provider Care Teams.  These Care Teams include your primary Cardiologist (physician) and Advanced Practice Providers (APPs -  Physician Assistants and Nurse Practitioners) who all work together to provide you with the care you need, when you need it.  We recommend signing up for the patient portal called "MyChart".  Sign up information is provided on this After Visit Summary.  MyChart is used to connect with patients for Virtual Visits (Telemedicine).  Patients are able to view lab/test results, encounter notes, upcoming appointments, etc.  Non-urgent messages can be sent to your provider as well.   To learn more about what you can do with MyChart, go to [email or web address].    Your next appointment:   4- 6  week(s)  The format for your next appointment:   In Person  Provider:   Bernerd Pho, PA-C   Other Instructions    None        Thank you for choosing [place] !

## 2019-09-28 NOTE — Progress Notes (Signed)
Cardiology Office Note  Date: 09/28/2019   ID: Benjamin Cordova, DOB Nov 13, 1934, MRN [id number]  PCP:  Celene Squibb, MD  Cardiologist:  Rozann Lesches, MD Electrophysiologist:  None   Chief Complaint  Patient presents with  . Cardiac follow-up    History of Present Illness: Benjamin Cordova is an 85 y.o. male last seen in December 2020.  He presents for a routine visit. I reviewed interval records.  He was recently seen in the [facility] with shortness of breath, evaluated by Dr. Roderic Palau.  BNP was 452, lactate normal, high-sensitivity troponin I flat at 18.  Chest CTA reported no pulmonary embolus, cardiomegaly, small and stable loculated posterior left pleural effusion.  Lasix was increased to 40 mg twice daily with continued potassium supplement.  Echocardiogram in March revealed LVEF 25 to 30% with laminated mural LV apical thrombus. He is on Coumadin with follow-up by Dr. Nevada Crane.  I reviewed his cardiac regimen which includes Coreg, Entresto, Zocor, and Lasix with potassium supplements.  I talked with him today about potentially addition of Aldactone and even as needed use of metolazone although it is not clear to me that he has been weighing himself regularly.  He has a pending visit with his PCP in early July.  He did not want to make any medication changes today.  I also talked with him about the likelihood that he has had progressive CAD and graft stenosis contributing to his cardiomyopathy.  I have brought up the possibility of a heart catheterization to assess this further, and he has not wanted to pursue it.  Past Medical History:  Diagnosis Date  . Arthritis   . Coronary artery disease    Multivessel s/p CABG [facility] at [facility]  . Essential hypertension   . Hematuria   . History of blood transfusion 07/2015   Knee replacement HgB <9  . History of pneumonia   . History of stroke   . Ischemic cardiomyopathy   . Mixed hyperlipidemia   . Myocardial infarction ([place]) 1980  .  Peripheral vascular disease ([place])   . Polio osteopathy of lower leg ([place]) Age 53   Affected right leg  . Skin cancer   . TIA (transient ischemic attack) 1989   Chronic coumadin  . UTI (lower urinary tract infection)     Past Surgical History:  Procedure Laterality Date  . CARDIAC CATHETERIZATION N/A 10/05/2014   Procedure: Left Heart Cath and Coronary Angiography;  Surgeon: Peter M Martinique, MD;  Location: [facility];  Service: Cardiovascular;  Laterality: N/A;  . CORONARY ARTERY BYPASS GRAFT  1981   [facility]  . CORONARY ARTERY BYPASS GRAFT N/A 10/08/2014   Procedure: REDO CORONARY ARTERY BYPASS GRAFTING (CABG) Times Two Grafts using Left Internal Mammary and Left Radial Artery;  Surgeon: Melrose Nakayama, MD;  Location: [facility];  Service: Open Heart Surgery;  Laterality: N/A;  . CYSTOSCOPY WITH INJECTION  10/10/2010   Procedure: CYSTOSCOPY WITH INJECTION;  Surgeon: Marissa Nestle;  Location: [facility];  Service: Urology;  Laterality: N/A;  with retrograde urethrogram  . FOOT SURGERY Right 1954   [facility], muscle implantation  . JOINT REPLACEMENT    . RADIAL ARTERY HARVEST Left 10/08/2014   Procedure: Left RADIAL ARTERY HARVEST;  Surgeon: Melrose Nakayama, MD;  Location: [facility];  Service: Open Heart Surgery;  Laterality: Left;  . TEE WITHOUT CARDIOVERSION N/A 10/08/2014   Procedure: TRANSESOPHAGEAL ECHOCARDIOGRAM (TEE);  Surgeon: Melrose Nakayama, MD;  Location: [facility];  Service: Open Heart Surgery;  Laterality: N/A;  . TONSILLECTOMY  1949   [facility]  . TOTAL KNEE ARTHROPLASTY Left 07/27/2015   Procedure: TOTAL LEFT KNEE ARTHROPLASTY;  Surgeon: [name], MD;  Location: [facility];  Service: Orthopedics;  Laterality: Left;    Current Outpatient Medications  Medication Sig Dispense Refill  . albuterol (PROVENTIL) (2.5 MG/3ML) 0.083% nebulizer solution Take 2.5 mg by nebulization every 6 (six) hours as needed for wheezing or shortness of breath.     . B Complex-C-Folic Acid (SUPER  B-COMPLEX/VIT C/FA) TABS Take 1 tablet by mouth daily.      . carvedilol (COREG) 6.25 MG tablet Take 1 tablet (6.25 mg total) by mouth 2 (two) times daily with a meal. 60 tablet 1  . diphenhydramine-acetaminophen (TYLENOL PM) 25-500 MG TABS tablet Take 2 tablets by mouth at bedtime.     . ferrous [id number] C-folic acid (TRINSICON / FOLTRIN) capsule Take 1 capsule by mouth daily.     . finasteride (PROSCAR) 5 MG tablet Take 5 mg by mouth daily.    . folic acid (FOLVITE) 244 MCG tablet Take 400 mcg by mouth daily.     . furosemide (LASIX) 40 MG tablet Take 40 mg by mouth 2 (two) times daily.    Javier Docker Oil (OMEGA-3) 500 MG CAPS Take 1 capsule by mouth at bedtime.     . Multiple Vitamins-Minerals (CERTAVITE/ANTIOXIDANTS) TABS Take 1 tablet by mouth daily.     . potassium chloride (K-DUR) 10 MEQ tablet Take 10 mEq by mouth daily. Take with Lasix (Furosemide)    . sacubitril-valsartan (ENTRESTO) 24-26 MG Take 1 tablet by mouth 2 (two) times daily. 60 tablet 1  . simvastatin (ZOCOR) 40 MG tablet Take 40 mg by mouth at bedtime.     . tamsulosin (FLOMAX) 0.4 MG CAPS capsule Take 0.4 mg by mouth in the morning.     . vitamin C (ASCORBIC ACID) 500 MG tablet Take 1,000 mg by mouth 2 (two) times daily.     Marland Kitchen warfarin (COUMADIN) 5 MG tablet Take 1.5 tablets (7.5 mg total) by mouth daily at 6 PM. OR AS DIRECTED by MD. (Patient taking differently: Take 5-10 mg by mouth See admin instructions. Take 10 mg on Sun, Monday, Wednesdays Take 5 mg on all other days) 60 tablet 0   No current facility-administered medications for this visit.   Allergies:  Patient has no known allergies.   ROS:   Chronic hearing loss.  Physical Exam: VS:  BP 108/68   Pulse (!) 104   Ht 5\' 8"  (1.727 m)   Wt 249 lb (112.9 kg)   SpO2 94%   BMI 37.86 kg/m , BMI Body mass index is 37.86 kg/m.  Wt Readings from Last 3 Encounters:  09/28/19 249 lb (112.9 kg)  09/15/19 245 lb (111.1 kg)  07/04/19 245 lb 9.5 oz (111.4 kg)     General: Elderly male, no distress.  Using upright rolling walker. HEENT: Conjunctiva and lids normal, wearing a mask. Neck: Supple, no elevated JVP or carotid bruits, no thyromegaly. Lungs: Decreased breath sounds at the bases with a few crackles, no wheezing, nonlabored at rest. Cardiac: Regular rate and rhythm, no S3, soft systolic murmur. Abdomen: Protuberant, bowel sounds present, no guarding or rebound. Extremities: Mild lower leg edema, left greater than right, distal pulses 2+.  ECG:  An ECG dated 09/15/2019 was personally reviewed today and demonstrated:  Sinus rhythm with frequent PVCs, old anterior infarct pattern.  Recent Labwork: 07/02/2019: Magnesium 2.3 09/15/2019: ALT 20; AST 19; B Natriuretic Peptide 452.0; BUN 23; Creatinine, Ser 0.89; Hemoglobin 12.4; Platelets 141; Potassium 4.1; Sodium 140     Component Value Date/Time   CHOL 118 (L) 03/05/2015 0834   TRIG 104 03/05/2015 0834   HDL 39 (L) 03/05/2015 0834   CHOLHDL 3.0 03/05/2015 0834   VLDL 21 03/05/2015 0834   LDLCALC 58 03/05/2015 0834    Other Studies Reviewed Today:  Echocardiogram 07/01/2019: 1. Laminated mural thrombus seen in LV apex. Left ventricular ejection  fraction, by estimation, is 25 to 30%. The left ventricle has severely  decreased function. The left ventricle demonstrates global hypokinesis.  The left ventricular internal cavity  size was mildly dilated. Left ventricular diastolic parameters are  consistent with Grade I diastolic dysfunction (impaired relaxation).  2. The mitral valve is grossly normal. Trivial mitral valve  regurgitation.  3. The aortic valve is tricuspid. Aortic valve regurgitation is not  visualized. No aortic stenosis is present.  4. There is normal pulmonary artery systolic pressure.   Chest CTA 09/15/2019: IMPRESSION: No evidence of pulmonary embolus.  Borderline sized mediastinal and hilar lymph nodes, stable.  Stable chronic areas of subpleural scarring  and rounded atelectasis the left lung. Stable small loculated posterior left pleural effusion.  Cardiomegaly.  Aortic Atherosclerosis (ICD10-I70.0) and Emphysema (ICD10-J43.9).  Assessment and Plan:  1.  Ischemic cardiomyopathy with chronic combined heart failure.  LVEF 25 to 30% by echocardiogram in March, mild diastolic dysfunction.  Current regimen includes Coreg, Entresto, Zocor, and Lasix at 40 mg twice daily as well as potassium supplement.  He is no longer on Aldactone 25 mg daily due to mild hyperkalemia as of March, although I suspect would be able to tolerate 12.5 mg daily.  Metolazone as needed could be considered as well, today he did not want to make any medication adjustments.  Recent renal function and potassium were normal.  I encouraged him to check weights daily, keep follow-up with PCP.  We will arrange closer office visit in the next 4 to 6 weeks.  As noted above, he has not wanted to pursue a cardiac catheterization.  2.  Laminated LV apical mural thrombus, he is on Coumadin with follow-up by Dr. Nevada Crane.  3.  Multivessel CAD status post CABG with redo operation in 2016, progressive native and graft disease suspected in association with worsening LVEF and ischemic cardiomyopathy.  4.  Mixed hyperlipidemia on Zocor.  Medication Adjustments/Labs and Tests Ordered: Current medicines are reviewed at length with the patient today.  Concerns regarding medicines are outlined above.   Tests Ordered: No orders of the defined types were placed in this encounter.   Medication Changes: No orders of the defined types were placed in this encounter.   Disposition:  Follow up 4 to 6 weeks with [place] in the [place] office.  Signed, Satira Sark, MD, [facility] 09/28/2019 4:15 PM    [facility] [address], [place], [place] [address] Phone: [phone]; Fax: [phone]

## 2019-10-02 DIAGNOSIS — N183 Chronic kidney disease, stage 3 unspecified: Secondary | ICD-10-CM | POA: Diagnosis not present

## 2019-10-02 DIAGNOSIS — Z7901 Long term (current) use of anticoagulants: Secondary | ICD-10-CM | POA: Diagnosis not present

## 2019-10-02 DIAGNOSIS — Z8673 Personal history of transient ischemic attack (TIA), and cerebral infarction without residual deficits: Secondary | ICD-10-CM | POA: Diagnosis not present

## 2019-10-02 DIAGNOSIS — I5032 Chronic diastolic (congestive) heart failure: Secondary | ICD-10-CM | POA: Diagnosis not present

## 2019-10-02 DIAGNOSIS — G14 Postpolio syndrome: Secondary | ICD-10-CM | POA: Diagnosis not present

## 2019-10-02 DIAGNOSIS — R7301 Impaired fasting glucose: Secondary | ICD-10-CM | POA: Diagnosis not present

## 2019-10-02 DIAGNOSIS — I130 Hypertensive heart and chronic kidney disease with heart failure and stage 1 through stage 4 chronic kidney disease, or unspecified chronic kidney disease: Secondary | ICD-10-CM | POA: Diagnosis not present

## 2019-10-02 DIAGNOSIS — D649 Anemia, unspecified: Secondary | ICD-10-CM | POA: Diagnosis not present

## 2019-10-05 DIAGNOSIS — I482 Chronic atrial fibrillation, unspecified: Secondary | ICD-10-CM | POA: Diagnosis not present

## 2019-10-05 DIAGNOSIS — Z7901 Long term (current) use of anticoagulants: Secondary | ICD-10-CM | POA: Diagnosis not present

## 2019-10-12 DIAGNOSIS — I5022 Chronic systolic (congestive) heart failure: Secondary | ICD-10-CM | POA: Diagnosis not present

## 2019-10-12 DIAGNOSIS — N400 Enlarged prostate without lower urinary tract symptoms: Secondary | ICD-10-CM | POA: Diagnosis not present

## 2019-10-12 DIAGNOSIS — I5032 Chronic diastolic (congestive) heart failure: Secondary | ICD-10-CM | POA: Diagnosis not present

## 2019-10-12 DIAGNOSIS — N401 Enlarged prostate with lower urinary tract symptoms: Secondary | ICD-10-CM | POA: Diagnosis not present

## 2019-10-12 DIAGNOSIS — Z0000 Encounter for general adult medical examination without abnormal findings: Secondary | ICD-10-CM | POA: Diagnosis not present

## 2019-10-12 DIAGNOSIS — N183 Chronic kidney disease, stage 3 unspecified: Secondary | ICD-10-CM | POA: Diagnosis not present

## 2019-10-12 DIAGNOSIS — E7849 Other hyperlipidemia: Secondary | ICD-10-CM | POA: Diagnosis not present

## 2019-10-12 DIAGNOSIS — R7303 Prediabetes: Secondary | ICD-10-CM | POA: Diagnosis not present

## 2019-10-12 DIAGNOSIS — Z0001 Encounter for general adult medical examination with abnormal findings: Secondary | ICD-10-CM | POA: Diagnosis not present

## 2019-10-12 DIAGNOSIS — I482 Chronic atrial fibrillation, unspecified: Secondary | ICD-10-CM | POA: Diagnosis not present

## 2019-10-12 DIAGNOSIS — I130 Hypertensive heart and chronic kidney disease with heart failure and stage 1 through stage 4 chronic kidney disease, or unspecified chronic kidney disease: Secondary | ICD-10-CM | POA: Diagnosis not present

## 2019-10-16 DIAGNOSIS — Z96652 Presence of left artificial knee joint: Secondary | ICD-10-CM | POA: Diagnosis not present

## 2019-10-16 DIAGNOSIS — R7303 Prediabetes: Secondary | ICD-10-CM | POA: Diagnosis not present

## 2019-10-16 DIAGNOSIS — G14 Postpolio syndrome: Secondary | ICD-10-CM | POA: Diagnosis not present

## 2019-10-16 DIAGNOSIS — Z8673 Personal history of transient ischemic attack (TIA), and cerebral infarction without residual deficits: Secondary | ICD-10-CM | POA: Diagnosis not present

## 2019-10-16 DIAGNOSIS — E782 Mixed hyperlipidemia: Secondary | ICD-10-CM | POA: Diagnosis not present

## 2019-10-16 DIAGNOSIS — D696 Thrombocytopenia, unspecified: Secondary | ICD-10-CM | POA: Diagnosis not present

## 2019-10-16 DIAGNOSIS — I129 Hypertensive chronic kidney disease with stage 1 through stage 4 chronic kidney disease, or unspecified chronic kidney disease: Secondary | ICD-10-CM | POA: Diagnosis not present

## 2019-10-16 DIAGNOSIS — Z0001 Encounter for general adult medical examination with abnormal findings: Secondary | ICD-10-CM | POA: Diagnosis not present

## 2019-10-16 DIAGNOSIS — I509 Heart failure, unspecified: Secondary | ICD-10-CM | POA: Diagnosis not present

## 2019-10-16 DIAGNOSIS — M1712 Unilateral primary osteoarthritis, left knee: Secondary | ICD-10-CM | POA: Diagnosis not present

## 2019-10-16 DIAGNOSIS — Z7901 Long term (current) use of anticoagulants: Secondary | ICD-10-CM | POA: Diagnosis not present

## 2019-11-02 DIAGNOSIS — Z7901 Long term (current) use of anticoagulants: Secondary | ICD-10-CM | POA: Diagnosis not present

## 2019-11-02 DIAGNOSIS — G14 Postpolio syndrome: Secondary | ICD-10-CM | POA: Diagnosis not present

## 2019-11-02 DIAGNOSIS — Z8673 Personal history of transient ischemic attack (TIA), and cerebral infarction without residual deficits: Secondary | ICD-10-CM | POA: Diagnosis not present

## 2019-11-02 DIAGNOSIS — D649 Anemia, unspecified: Secondary | ICD-10-CM | POA: Diagnosis not present

## 2019-11-02 DIAGNOSIS — R7301 Impaired fasting glucose: Secondary | ICD-10-CM | POA: Diagnosis not present

## 2019-11-02 DIAGNOSIS — I130 Hypertensive heart and chronic kidney disease with heart failure and stage 1 through stage 4 chronic kidney disease, or unspecified chronic kidney disease: Secondary | ICD-10-CM | POA: Diagnosis not present

## 2019-11-02 DIAGNOSIS — I5032 Chronic diastolic (congestive) heart failure: Secondary | ICD-10-CM | POA: Diagnosis not present

## 2019-11-02 DIAGNOSIS — N183 Chronic kidney disease, stage 3 unspecified: Secondary | ICD-10-CM | POA: Diagnosis not present

## 2019-11-10 ENCOUNTER — Other Ambulatory Visit: Payer: Self-pay

## 2019-11-10 ENCOUNTER — Encounter: Payer: Self-pay | Admitting: Student

## 2019-11-10 ENCOUNTER — Ambulatory Visit (INDEPENDENT_AMBULATORY_CARE_PROVIDER_SITE_OTHER): Payer: Medicare HMO | Admitting: Student

## 2019-11-10 VITALS — BP 102/62 | HR 100 | Ht 68.0 in | Wt 244.4 lb

## 2019-11-10 DIAGNOSIS — I10 Essential (primary) hypertension: Secondary | ICD-10-CM | POA: Diagnosis not present

## 2019-11-10 DIAGNOSIS — I255 Ischemic cardiomyopathy: Secondary | ICD-10-CM | POA: Diagnosis not present

## 2019-11-10 DIAGNOSIS — I5042 Chronic combined systolic (congestive) and diastolic (congestive) heart failure: Secondary | ICD-10-CM

## 2019-11-10 DIAGNOSIS — I25118 Atherosclerotic heart disease of native coronary artery with other forms of angina pectoris: Secondary | ICD-10-CM

## 2019-11-10 DIAGNOSIS — I513 Intracardiac thrombosis, not elsewhere classified: Secondary | ICD-10-CM

## 2019-11-10 DIAGNOSIS — E782 Mixed hyperlipidemia: Secondary | ICD-10-CM | POA: Diagnosis not present

## 2019-11-10 MED ORDER — SPIRONOLACTONE 25 MG PO TABS
12.5000 mg | ORAL_TABLET | Freq: Every day | ORAL | 3 refills | Status: DC
Start: 2019-11-10 — End: 2020-11-14

## 2019-11-10 NOTE — Progress Notes (Signed)
Cardiology Office Note    Date:  11/10/2019   ID:  Benjamin Cordova, DOB 09-May-1934, MRN [id number]  PCP:  Celene Squibb, MD  Cardiologist: Rozann Lesches, MD    Chief Complaint  Patient presents with  . Follow-up    6 week visit    History of Present Illness:    Benjamin Cordova is a 85 y.o. male with past medical history of CAD (s/p CABG in 1981, cath in 10/2014 showing occluded grafts --> redo CABG in 2016 LIMA-LAD and Left Radial-OM2), chronic combined systolic and diastolic CHF (EF 97-98% in 2016, 40-50% in 03/2018, at 30-35% in 03/2019, 25-30% by echo in 06/2019), history of LV apical thrombus (noted on echo in 06/2019 as well - on Coumadin), HTN, HLD and history of CVA who presents to the office today for 6-week follow-up.   He was last examined by Dr. Domenic Polite in 09/2019 and he reported having dyspnea on exertion but denied any chest pain. He was continued on Coreg 6.25mg  BID, Lasix 40mg  BID, Entresto 24-26mg  BID and Coumadin. Was recommended to consider the use of Spironolactone 12.5mg  daily but he denied any medication adjustments at that time. Repeat ischemic evaluation was also reviewed and he was not interested in aggressive measures.    In talking with the patient today, he reports overall doing well since his last visit. He does use a walker for ambulation but still remains active and enjoys going to watch his grandchildren play sports. He has been walking on his treadmill at night for 1.5 minute intervals. His BP was initially recorded as 78/52 during today's visit and was rechecked and improved to 102/62.  He reports that his PCP started him on Spironolactone 25 mg daily several weeks ago and he has not checked his blood pressure at home since. He reports feeling fatigued but denies any dizziness or presyncope. He does have baseline dyspnea on exertion but denies any chest pain or palpitations. No recent orthopnea or pitting edema.   Past Medical History:  Diagnosis Date  .  Arthritis   . Coronary artery disease    a. s/p CABG in 1981 b. cath in 10/2014 showing occluded grafts --> redo CABG in 2016 LIMA-LAD and Left Radial-OM2  . Essential hypertension   . Hematuria   . History of blood transfusion 07/2015   Knee replacement HgB <9  . History of pneumonia   . History of stroke   . Ischemic cardiomyopathy   . Left ventricular apical thrombus   . Mixed hyperlipidemia   . Myocardial infarction ([place]) 1980  . Peripheral vascular disease ([place])   . 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 Skin cancer   . TIA (transient ischemic attack) 1989   Chronic coumadin  . UTI (lower urinary tract infection)     Past Surgical History:  Procedure Laterality Date  . CARDIAC CATHETERIZATION N/A 10/05/2014   Procedure: Left Heart Cath and Coronary Angiography;  Surgeon: Peter M Martinique, MD;  Location: [facility];  Service: Cardiovascular;  Laterality: N/A;  . CORONARY ARTERY BYPASS GRAFT  1981   [facility]  . CORONARY ARTERY BYPASS GRAFT N/A 10/08/2014   Procedure: REDO CORONARY ARTERY BYPASS GRAFTING (CABG) Times Two Grafts using Left Internal Mammary and Left Radial Artery;  Surgeon: Melrose Nakayama, MD;  Location: [facility];  Service: Open Heart Surgery;  Laterality: N/A;  . CYSTOSCOPY WITH INJECTION  10/10/2010   Procedure: CYSTOSCOPY WITH  INJECTION;  Surgeon: Marissa Nestle;  Location: [facility];  Service: Urology;  Laterality: N/A;  with retrograde urethrogram  . FOOT SURGERY Right 1954   [facility], muscle implantation  . JOINT REPLACEMENT    . RADIAL ARTERY HARVEST Left 10/08/2014   Procedure: Left RADIAL ARTERY HARVEST;  Surgeon: Melrose Nakayama, MD;  Location: [facility];  Service: Open Heart Surgery;  Laterality: Left;  . TEE WITHOUT CARDIOVERSION N/A 10/08/2014   Procedure: TRANSESOPHAGEAL ECHOCARDIOGRAM (TEE);  Surgeon: Melrose Nakayama, MD;  Location: [facility];  Service: Open Heart Surgery;  Laterality: N/A;  . TONSILLECTOMY  1949    [facility]  . TOTAL KNEE ARTHROPLASTY Left 07/27/2015   Procedure: TOTAL LEFT KNEE ARTHROPLASTY;  Surgeon: [name], MD;  Location: [facility];  Service: Orthopedics;  Laterality: Left;    Current Medications: Outpatient Medications Prior to Visit  Medication Sig Dispense Refill  . albuterol (PROVENTIL) (2.5 MG/3ML) 0.083% nebulizer solution Take 2.5 mg by nebulization every 6 (six) hours as needed for wheezing or shortness of breath.     . B Complex-C-Folic Acid (SUPER B-COMPLEX/VIT C/FA) TABS Take 1 tablet by mouth daily.      . carvedilol (COREG) 6.25 MG tablet Take 1 tablet (6.25 mg total) by mouth 2 (two) times daily with a meal. 60 tablet 1  . diphenhydramine-acetaminophen (TYLENOL PM) 25-500 MG TABS tablet Take 2 tablets by mouth at bedtime.     . finasteride (PROSCAR) 5 MG tablet Take 5 mg by mouth daily.    . folic acid (FOLVITE) 326 MCG tablet Take 400 mcg by mouth daily.     . furosemide (LASIX) 40 MG tablet Take 40 mg by mouth 2 (two) times daily.    Javier Docker Oil (OMEGA-3) 500 MG CAPS Take 1 capsule by mouth at bedtime.     . Multiple Vitamins-Minerals (CERTAVITE/ANTIOXIDANTS) TABS Take 1 tablet by mouth daily.     . potassium chloride (K-DUR) 10 MEQ tablet Take 10 mEq by mouth daily. Take with Lasix (Furosemide)    . sacubitril-valsartan (ENTRESTO) 24-26 MG Take 1 tablet by mouth 2 (two) times daily. 60 tablet 1  . simvastatin (ZOCOR) 40 MG tablet Take 40 mg by mouth at bedtime.     . tamsulosin (FLOMAX) 0.4 MG CAPS capsule Take 0.4 mg by mouth in the morning.     . vitamin C (ASCORBIC ACID) 500 MG tablet Take 1,000 mg by mouth 2 (two) times daily.     Marland Kitchen warfarin (COUMADIN) 5 MG tablet Take 1.5 tablets (7.5 mg total) by mouth daily at 6 PM. OR AS DIRECTED by MD. 60 tablet 0  . ferrous [id number] C-folic acid (TRINSICON / FOLTRIN) capsule Take 1 capsule by mouth daily.  (Patient not taking: Reported on 11/10/2019)     No facility-administered medications prior to visit.      Allergies:   Patient has no known allergies.   Social History   Socioeconomic History  . Marital status: Widowed    Spouse name: Not on file  . Number of children: Not on file  . Years of education: Not on file  . Highest education level: Not on file  Occupational History  . Not on file  Tobacco Use  . Smoking status: Former Smoker    Packs/day: 1.00    Years: 28.00    Pack years: 28.00    Types: Cigarettes    Quit date: 09/22/1978    Years since quitting: 41.1  . Smokeless tobacco: Never Used  Vaping Use  . Vaping Use: Never used  Substance and Sexual Activity  . Alcohol use: No    Alcohol/week: 0.0 standard drinks  . Drug use: No  . Sexual activity: Not on file  Other Topics Concern  . Not on file  Social History Narrative  . Not on file   Social Determinants of Health   Financial Resource Strain:   . Difficulty of Paying Living Expenses:   Food Insecurity:   . Worried About Charity fundraiser in the Last Year:   . Arboriculturist in the Last Year:   Transportation Needs:   . Film/video editor (Medical):   Marland Kitchen Lack of Transportation (Non-Medical):   Physical Activity:   . Days of Exercise per Week:   . Minutes of Exercise per Session:   Stress:   . Feeling of Stress :   Social Connections:   . Frequency of Communication with Friends and Family:   . Frequency of Social Gatherings with Friends and Family:   . Attends Religious Services:   . Active Member of Clubs or Organizations:   . Attends Archivist Meetings:   Marland Kitchen Marital Status:      Family History:  The patient's family history includes Diabetes in his mother; Heart attack in his brother, father, and mother.   Review of Systems:   Please see the history of present illness.     General:  No chills, fever, night sweats or weight changes.  Cardiovascular:  No chest pain, edema, orthopnea, palpitations, paroxysmal nocturnal dyspnea. Positive for dyspnea on exertion.  Dermatological: No  rash, lesions/masses Respiratory: No cough, dyspnea Urologic: No hematuria, dysuria Abdominal:   No nausea, vomiting, diarrhea, bright red blood per rectum, melena, or hematemesis Neurologic:  No visual changes, wkns, changes in mental status. All other systems reviewed and are otherwise negative except as noted above.   Physical Exam:    VS:  BP 102/62   Pulse 100   Ht 5\' 8"  (1.727 m)   Wt 244 lb 6.4 oz (110.9 kg)   SpO2 95%   BMI 37.16 kg/m    General: Well developed, elderly male appearing in no acute distress. Head: Normocephalic, atraumatic. Wearing a mask.  Neck: No carotid bruits. JVD not elevated. Lungs: Respirations regular and unlabored, without wheezes or rales.  Heart: Regular rate and rhythm. No S3 or S4.  No rubs or gallops appreciated. 2/6 SEM along RUSB.  Abdomen: Appears non-distended. No obvious abdominal masses. Msk:  Strength and tone appear normal for age. No obvious joint deformities or effusions. Extremities: No clubbing or cyanosis. Trace ankle edema bilaterally. Right leg in brace.  Distal pedal pulses are 2+ bilaterally. Neuro: Alert and oriented X 3. Moves all extremities spontaneously. No focal deficits noted. Psych:  Responds to questions appropriately with a normal affect. Skin: No rashes or lesions noted  Wt Readings from Last 3 Encounters:  11/10/19 244 lb 6.4 oz (110.9 kg)  09/28/19 249 lb (112.9 kg)  09/15/19 245 lb (111.1 kg)     Studies/Labs Reviewed:   EKG:  EKG is not ordered today.    Recent Labs: 07/02/2019: Magnesium 2.3 09/15/2019: ALT 20; B Natriuretic Peptide 452.0; BUN 23; Creatinine, Ser 0.89; Hemoglobin 12.4; Platelets 141; Potassium 4.1; Sodium 140   Lipid Panel    Component Value Date/Time   CHOL 118 (L) 03/05/2015 0834   TRIG 104 03/05/2015 0834   HDL 39 (L) 03/05/2015 0834   CHOLHDL 3.0 03/05/2015 5631  VLDL 21 03/05/2015 0834   LDLCALC 58 03/05/2015 0834    Additional studies/ records that were reviewed today  include:   NST: 09/2015 IMPRESSION: 1. Large fixed defect at the apex extending into the anterior and inferior walls. Concern for peri-infarct ischemia in the anterior wall and lower septum.  2. Severe diffuse hypokinesia with akinesia in the areas of scar.  3. Left ventricular ejection fraction 26%  4. Non invasive risk stratification*: High risk  Limited Echocardiogram: 06/2019 IMPRESSIONS    1. Laminated mural thrombus seen in LV apex. Left ventricular ejection  fraction, by estimation, is 25 to 30%. The left ventricle has severely  decreased function. The left ventricle demonstrates global hypokinesis.  The left ventricular internal cavity  size was mildly dilated. Left ventricular diastolic parameters are  consistent with Grade I diastolic dysfunction (impaired relaxation).  2. The mitral valve is grossly normal. Trivial mitral valve  regurgitation.  3. The aortic valve is tricuspid. Aortic valve regurgitation is not  visualized. No aortic stenosis is present.  4. There is normal pulmonary artery systolic pressure.   Assessment:    1. Coronary artery disease of native artery of native heart with stable angina pectoris ([place])   2. Chronic combined systolic and diastolic heart failure ([place])   3. Ischemic cardiomyopathy   4. LV (left ventricular) mural thrombus   5. Essential hypertension   6. Mixed hyperlipidemia      Plan:   In order of problems listed above:  1. CAD - He is s/p CABG in 1981 with cath in 10/2014 showing occluded grafts --> redo CABG in LIMA-LAD and Left Radial-OM2.  - He reports having baseline dyspnea on exertion but denies any recent chest pain. By review of prior notes, he has declined repeat ischemic evaluation and in talking with the patient today, he says he would never undergo a repeat cardiac catheterization as he wishes to pass when it is his time. - Continue current medication regimen with Coreg 6.25 mg twice daily and Simvastatin  40 mg daily (would not further titrate given the high-likelihood of drug interactions with Simvastatin).  He is not on ASA given the need for anticoagulation.  2. Chronic Combined Systolic and Diastolic CHF/Ischemic Cardiomyopathy - EF was previously 35-40% in 2017, 40-50% in 03/2018, at 30-35% in 03/2019, at 25-30% by echo in 06/2019. He has baseline dyspnea on exertion but denies any recent orthopnea, PND or lower extremity edema. He is hypotensive as outlined above during today's visit. He is currently on Coreg 6.25 mg twice daily, Lasix 40 mg twice daily, Entresto 24-26 mg twice daily (hypotensive with higher dosing) and Spironolactone 25 mg daily. I recommended he reduce his Spironolactone to 12.5 mg daily and call back if SBP remains less than 100 when checked at home as this may need to be discontinued. He previously did not tolerate this in the past due to hypotension.   3. LV Apical Thrombus - Echocardiogram in 06/2019 showed a laminated mural thrombus in the LV Apex. He remains on Coumadin for anticoagulation which is followed by his PCP.  Denies any evidence of active bleeding.  4. HTN - He is actually hypotensive as outlined above and we did reduce his Spironolactone to 12.5 mg daily.  He will call back if SBP remains less than 100 when checked at home as this may need to be discontinued. Continue Coreg and Entresto at current dosing for now.  5. HLD - Followed by PCP. He remains on Simvastatin 40 mg  daily with goal LDL less than 70 in the setting of known CAD.  Medication Adjustments/Labs and Tests Ordered: Current medicines are reviewed at length with the patient today.  Concerns regarding medicines are outlined above.  Medication changes, Labs and Tests ordered today are listed in the Patient Instructions below. Patient Instructions  Medication Instructions:  Your physician has recommended you make the following change in your medication:   Decrease Spirolactone to 12.5 mg daily    Call if Systolic Blood Pressure is less than 100.  *If you need a refill on your cardiac medications before your next appointment, please call your pharmacy*   Lab Work: NONE   If you have labs (blood work) drawn today and your tests are completely normal, you will receive your results only by: Marland Kitchen MyChart Message (if you have MyChart) OR . A paper copy in the mail If you have any lab test that is abnormal or we need to change your treatment, we will call you to review the results.   Testing/Procedures: NONE    Follow-Up: At [facility], you and your health needs are our priority.  As part of our continuing mission to provide you with exceptional heart care, we have created designated Provider Care Teams.  These Care Teams include your primary Cardiologist (physician) and Advanced Practice Providers (APPs -  Physician Assistants and Nurse Practitioners) who all work together to provide you with the care you need, when you need it.  We recommend signing up for the patient portal called "MyChart".  Sign up information is provided on this After Visit Summary.  MyChart is used to connect with patients for Virtual Visits (Telemedicine).  Patients are able to view lab/test results, encounter notes, upcoming appointments, etc.  Non-urgent messages can be sent to your provider as well.   To learn more about what you can do with MyChart, go to [email or web address].    Your next appointment:   2 month(s)  The format for your next appointment:   In Person  Provider:   Rozann Lesches, MD   Other Instructions Thank you for choosing [facility]!        Signed, Erma Heritage, PA-C  11/10/2019 5:28 PM    [facility] [address] [place], [place] [address] Phone: [phone] Fax: [phone]

## 2019-11-10 NOTE — Patient Instructions (Addendum)
Medication Instructions:  Your physician has recommended you make the following change in your medication:   Decrease Spirolactone to 12.5 mg daily   Call if Systolic Blood Pressure is less than 100.  *If you need a refill on your cardiac medications before your next appointment, please call your pharmacy*   Lab Work: NONE   If you have labs (blood work) drawn today and your tests are completely normal, you will receive your results only by: Marland Kitchen MyChart Message (if you have MyChart) OR . A paper copy in the mail If you have any lab test that is abnormal or we need to change your treatment, we will call you to review the results.   Testing/Procedures: NONE    Follow-Up: At [facility], you and your health needs are our priority.  As part of our continuing mission to provide you with exceptional heart care, we have created designated Provider Care Teams.  These Care Teams include your primary Cardiologist (physician) and Advanced Practice Providers (APPs -  Physician Assistants and Nurse Practitioners) who all work together to provide you with the care you need, when you need it.  We recommend signing up for the patient portal called "MyChart".  Sign up information is provided on this After Visit Summary.  MyChart is used to connect with patients for Virtual Visits (Telemedicine).  Patients are able to view lab/test results, encounter notes, upcoming appointments, etc.  Non-urgent messages can be sent to your provider as well.   To learn more about what you can do with MyChart, go to [email or web address].    Your next appointment:   2 month(s)  The format for your next appointment:   In Person  Provider:   Rozann Lesches, MD   Other Instructions Thank you for choosing [facility]!

## 2019-11-16 DIAGNOSIS — Z7901 Long term (current) use of anticoagulants: Secondary | ICD-10-CM | POA: Diagnosis not present

## 2019-11-16 DIAGNOSIS — I5022 Chronic systolic (congestive) heart failure: Secondary | ICD-10-CM | POA: Diagnosis not present

## 2019-11-16 DIAGNOSIS — I255 Ischemic cardiomyopathy: Secondary | ICD-10-CM | POA: Diagnosis not present

## 2019-11-16 DIAGNOSIS — I10 Essential (primary) hypertension: Secondary | ICD-10-CM | POA: Diagnosis not present

## 2019-12-18 DIAGNOSIS — I5032 Chronic diastolic (congestive) heart failure: Secondary | ICD-10-CM | POA: Diagnosis not present

## 2019-12-18 DIAGNOSIS — Z7901 Long term (current) use of anticoagulants: Secondary | ICD-10-CM | POA: Diagnosis not present

## 2019-12-18 DIAGNOSIS — D649 Anemia, unspecified: Secondary | ICD-10-CM | POA: Diagnosis not present

## 2019-12-18 DIAGNOSIS — R7301 Impaired fasting glucose: Secondary | ICD-10-CM | POA: Diagnosis not present

## 2019-12-18 DIAGNOSIS — N183 Chronic kidney disease, stage 3 unspecified: Secondary | ICD-10-CM | POA: Diagnosis not present

## 2019-12-18 DIAGNOSIS — G14 Postpolio syndrome: Secondary | ICD-10-CM | POA: Diagnosis not present

## 2019-12-18 DIAGNOSIS — Z8673 Personal history of transient ischemic attack (TIA), and cerebral infarction without residual deficits: Secondary | ICD-10-CM | POA: Diagnosis not present

## 2019-12-18 DIAGNOSIS — I130 Hypertensive heart and chronic kidney disease with heart failure and stage 1 through stage 4 chronic kidney disease, or unspecified chronic kidney disease: Secondary | ICD-10-CM | POA: Diagnosis not present

## 2019-12-28 DIAGNOSIS — Z7901 Long term (current) use of anticoagulants: Secondary | ICD-10-CM | POA: Diagnosis not present

## 2019-12-28 DIAGNOSIS — I255 Ischemic cardiomyopathy: Secondary | ICD-10-CM | POA: Diagnosis not present

## 2020-01-11 ENCOUNTER — Ambulatory Visit: Payer: Medicare HMO | Admitting: Physician Assistant

## 2020-02-23 DIAGNOSIS — I5022 Chronic systolic (congestive) heart failure: Secondary | ICD-10-CM | POA: Diagnosis not present

## 2020-02-23 DIAGNOSIS — I5032 Chronic diastolic (congestive) heart failure: Secondary | ICD-10-CM | POA: Diagnosis not present

## 2020-02-23 DIAGNOSIS — N183 Chronic kidney disease, stage 3 unspecified: Secondary | ICD-10-CM | POA: Diagnosis not present

## 2020-02-23 DIAGNOSIS — N400 Enlarged prostate without lower urinary tract symptoms: Secondary | ICD-10-CM | POA: Diagnosis not present

## 2020-02-23 DIAGNOSIS — R7303 Prediabetes: Secondary | ICD-10-CM | POA: Diagnosis not present

## 2020-02-23 DIAGNOSIS — I482 Chronic atrial fibrillation, unspecified: Secondary | ICD-10-CM | POA: Diagnosis not present

## 2020-02-23 DIAGNOSIS — E7849 Other hyperlipidemia: Secondary | ICD-10-CM | POA: Diagnosis not present

## 2020-02-23 DIAGNOSIS — I130 Hypertensive heart and chronic kidney disease with heart failure and stage 1 through stage 4 chronic kidney disease, or unspecified chronic kidney disease: Secondary | ICD-10-CM | POA: Diagnosis not present

## 2020-02-23 DIAGNOSIS — Z0000 Encounter for general adult medical examination without abnormal findings: Secondary | ICD-10-CM | POA: Diagnosis not present

## 2020-02-23 DIAGNOSIS — Z0001 Encounter for general adult medical examination with abnormal findings: Secondary | ICD-10-CM | POA: Diagnosis not present

## 2020-02-24 DIAGNOSIS — R7301 Impaired fasting glucose: Secondary | ICD-10-CM | POA: Diagnosis not present

## 2020-02-24 DIAGNOSIS — Z7901 Long term (current) use of anticoagulants: Secondary | ICD-10-CM | POA: Diagnosis not present

## 2020-02-24 DIAGNOSIS — N183 Chronic kidney disease, stage 3 unspecified: Secondary | ICD-10-CM | POA: Diagnosis not present

## 2020-02-24 DIAGNOSIS — G14 Postpolio syndrome: Secondary | ICD-10-CM | POA: Diagnosis not present

## 2020-02-24 DIAGNOSIS — D649 Anemia, unspecified: Secondary | ICD-10-CM | POA: Diagnosis not present

## 2020-02-24 DIAGNOSIS — I130 Hypertensive heart and chronic kidney disease with heart failure and stage 1 through stage 4 chronic kidney disease, or unspecified chronic kidney disease: Secondary | ICD-10-CM | POA: Diagnosis not present

## 2020-02-24 DIAGNOSIS — E7849 Other hyperlipidemia: Secondary | ICD-10-CM | POA: Diagnosis not present

## 2020-02-24 DIAGNOSIS — Z8673 Personal history of transient ischemic attack (TIA), and cerebral infarction without residual deficits: Secondary | ICD-10-CM | POA: Diagnosis not present

## 2020-02-24 DIAGNOSIS — I5032 Chronic diastolic (congestive) heart failure: Secondary | ICD-10-CM | POA: Diagnosis not present

## 2020-03-01 DIAGNOSIS — G14 Postpolio syndrome: Secondary | ICD-10-CM | POA: Diagnosis not present

## 2020-03-01 DIAGNOSIS — M1712 Unilateral primary osteoarthritis, left knee: Secondary | ICD-10-CM | POA: Diagnosis not present

## 2020-03-01 DIAGNOSIS — E782 Mixed hyperlipidemia: Secondary | ICD-10-CM | POA: Diagnosis not present

## 2020-03-01 DIAGNOSIS — R7303 Prediabetes: Secondary | ICD-10-CM | POA: Diagnosis not present

## 2020-03-01 DIAGNOSIS — I255 Ischemic cardiomyopathy: Secondary | ICD-10-CM | POA: Diagnosis not present

## 2020-03-01 DIAGNOSIS — I10 Essential (primary) hypertension: Secondary | ICD-10-CM | POA: Diagnosis not present

## 2020-03-01 DIAGNOSIS — I5022 Chronic systolic (congestive) heart failure: Secondary | ICD-10-CM | POA: Diagnosis not present

## 2020-03-01 DIAGNOSIS — Z96652 Presence of left artificial knee joint: Secondary | ICD-10-CM | POA: Diagnosis not present

## 2020-03-01 DIAGNOSIS — I129 Hypertensive chronic kidney disease with stage 1 through stage 4 chronic kidney disease, or unspecified chronic kidney disease: Secondary | ICD-10-CM | POA: Diagnosis not present

## 2020-04-07 DIAGNOSIS — R3914 Feeling of incomplete bladder emptying: Secondary | ICD-10-CM | POA: Diagnosis not present

## 2020-04-07 DIAGNOSIS — Z125 Encounter for screening for malignant neoplasm of prostate: Secondary | ICD-10-CM | POA: Diagnosis not present

## 2020-04-07 DIAGNOSIS — N401 Enlarged prostate with lower urinary tract symptoms: Secondary | ICD-10-CM | POA: Diagnosis not present

## 2020-04-19 NOTE — Progress Notes (Unsigned)
Cardiology Office Note  Date: 04/20/2020   ID: Benjamin Cordova, DOB [birth year], MRN [id number]  PCP:  Celene Squibb, MD  Cardiologist:  Rozann Lesches, MD Electrophysiologist:  None   Chief Complaint  Patient presents with  . Cardiac follow-up    History of Present Illness: Benjamin Cordova is an 86 y.o. male last seen in August 2021 by Ms. Strader PA-C.  He presents for a follow-up visit.  Fortunately, doing well without recurrent angina on current medical regimen.  He tells me that he uses a treadmill at home, has been gradually working himself up to a few minutes at a very slow pace.  He reports compliance with his medications and no obvious intolerances.  He continues on Coumadin with follow-up by Dr. Nevada Cordova.  No bleeding problems.  Past Medical History:  Diagnosis Date  . Arthritis   . Coronary artery disease    a. s/p CABG in 1981 b. cath in 10/2014 showing occluded grafts --> redo CABG in 2016 LIMA-LAD and Left Radial-OM2  . Essential hypertension   . Hematuria   . History of blood transfusion 07/2015   Knee replacement HgB <9  . History of pneumonia   . History of stroke   . Ischemic cardiomyopathy   . Left ventricular apical thrombus   . Mixed hyperlipidemia   . Myocardial infarction ([place]) 1980  . Peripheral vascular disease ([place])   . Polio osteopathy of lower leg ([place]) Age 70   Affected right leg  . Skin cancer   . TIA (transient ischemic attack) 1989   Chronic coumadin  . UTI (lower urinary tract infection)     Past Surgical History:  Procedure Laterality Date  . CARDIAC CATHETERIZATION N/A 10/05/2014   Procedure: Left Heart Cath and Coronary Angiography;  Surgeon: Benjamin M Martinique, MD;  Location: [place] [facility];  Service: Cardiovascular;  Laterality: N/A;  . CORONARY ARTERY BYPASS GRAFT  1981   [facility]  . CORONARY ARTERY BYPASS GRAFT N/A 10/08/2014   Procedure: REDO CORONARY ARTERY BYPASS GRAFTING (CABG) Times Two Grafts using Left Internal Mammary  and Left Radial Artery;  Surgeon: Benjamin Nakayama, MD;  Location: [place];  Service: Open Heart Surgery;  Laterality: N/A;  . CYSTOSCOPY WITH INJECTION  10/10/2010   Procedure: CYSTOSCOPY WITH INJECTION;  Surgeon: Marissa Nestle;  Location: [facility];  Service: Urology;  Laterality: N/A;  with retrograde urethrogram  . FOOT SURGERY Right 1954   [facility], muscle implantation  . JOINT REPLACEMENT    . RADIAL ARTERY HARVEST Left 10/08/2014   Procedure: Left RADIAL ARTERY HARVEST;  Surgeon: Benjamin Nakayama, MD;  Location: [place];  Service: Open Heart Surgery;  Laterality: Left;  . TEE WITHOUT CARDIOVERSION N/A 10/08/2014   Procedure: TRANSESOPHAGEAL ECHOCARDIOGRAM (TEE);  Surgeon: Benjamin Nakayama, MD;  Location: [place];  Service: Open Heart Surgery;  Laterality: N/A;  . TONSILLECTOMY  1949   [facility]  . TOTAL KNEE ARTHROPLASTY Left 07/27/2015   Procedure: TOTAL LEFT KNEE ARTHROPLASTY;  Surgeon: Benjamin Lights, MD;  Location: [place];  Service: Orthopedics;  Laterality: Left;    Current Outpatient Medications  Medication Sig Dispense Refill  . albuterol (PROVENTIL) (2.5 MG/3ML) 0.083% nebulizer solution Take 2.5 mg by nebulization every 6 (six) hours as needed for wheezing or shortness of breath.     . B Complex-C-Folic Acid (SUPER B-COMPLEX/VIT C/FA) TABS Take 1 tablet by mouth daily.    . carvedilol (COREG) 6.25 MG tablet Take 1  tablet (6.25 mg total) by mouth 2 (two) times daily with a meal. 60 tablet 1  . diphenhydramine-acetaminophen (TYLENOL PM) 25-500 MG TABS tablet Take 2 tablets by mouth at bedtime.     . finasteride (PROSCAR) 5 MG tablet Take 5 mg by mouth daily.    . folic acid (FOLVITE) 841 MCG tablet Take 400 mcg by mouth daily.    . furosemide (LASIX) 40 MG tablet Take 40 mg by mouth 2 (two) times daily.    Javier Docker Oil (OMEGA-3) 500 MG CAPS Take 1 capsule by mouth at bedtime.     . Multiple Vitamins-Minerals (CERTAVITE/ANTIOXIDANTS) TABS Take 1 tablet by mouth daily.     . potassium  chloride (K-DUR) 10 MEQ tablet Take 10 mEq by mouth daily. Take with Lasix (Furosemide)    . sacubitril-valsartan (ENTRESTO) 24-26 MG Take 1 tablet by mouth 2 (two) times daily. 60 tablet 1  . simvastatin (ZOCOR) 40 MG tablet Take 40 mg by mouth at bedtime.     . tamsulosin (FLOMAX) 0.4 MG CAPS capsule Take 0.4 mg by mouth in the morning.     . vitamin C (ASCORBIC ACID) 500 MG tablet Take 1,000 mg by mouth 2 (two) times daily.    Marland Kitchen warfarin (COUMADIN) 5 MG tablet Take 1.5 tablets (7.5 mg total) by mouth daily at 6 PM. OR AS DIRECTED by MD. 60 tablet 0  . spironolactone (ALDACTONE) 25 MG tablet Take 0.5 tablets (12.5 mg total) by mouth daily. 45 tablet 3   No current facility-administered medications for this visit.   Allergies:  Patient has no known allergies.   ROS: Hearing loss.  Physical Exam: VS:  BP 116/68   Pulse 78   Ht 5\' 8"  (1.727 m)   Wt 248 lb (112.5 kg)   SpO2 92%   BMI 37.71 kg/m , BMI Body mass index is 37.71 kg/m.  Wt Readings from Last 3 Encounters:  04/20/20 248 lb (112.5 kg)  11/10/19 244 lb 6.4 oz (110.9 kg)  09/28/19 249 lb (112.9 kg)    General: Elderly male, using a rolling walker today. HEENT: Conjunctiva and lids normal, wearing a mask. Neck: Supple, no elevated JVP or carotid bruits, no thyromegaly. Lungs: Decreased breath sounds, nonlabored breathing at rest. Cardiac: Regular rate and rhythm, no S3, soft systolic murmur. Abdomen: Soft, nontender, bowel sounds present. Extremities: Mild lower leg edema.  ECG:  An ECG dated 09/16/2019 was personally reviewed today and demonstrated:  Sinus rhythm with PVCs, old anterolateral infarct pattern.  Recent Labwork: 07/02/2019: Magnesium 2.3 09/15/2019: ALT 20; AST 19; B Natriuretic Peptide 452.0; BUN 23; Creatinine, Ser 0.89; Hemoglobin 12.4; Platelets 141; Potassium 4.1; Sodium 140     Component Value Date/Time   CHOL 118 (L) 03/05/2015 0834   TRIG 104 03/05/2015 0834   HDL 39 (L) 03/05/2015 0834   CHOLHDL  3.0 03/05/2015 0834   VLDL 21 03/05/2015 0834   LDLCALC 58 03/05/2015 0834    Other Studies Reviewed Today:  Echocardiogram 07/01/2019: 1. Laminated mural thrombus seen in LV apex. Left ventricular ejection  fraction, by estimation, is 25 to 30%. The left ventricle has severely  decreased function. The left ventricle demonstrates global hypokinesis.  The left ventricular internal cavity  size was mildly dilated. Left ventricular diastolic parameters are  consistent with Grade I diastolic dysfunction (impaired relaxation).  2. The mitral valve is grossly normal. Trivial mitral valve  regurgitation.  3. The aortic valve is tricuspid. Aortic valve regurgitation is not  visualized. No  aortic stenosis is present.  4. There is normal pulmonary artery systolic pressure.   Assessment and Plan:  1. Ischemic cardiomyopathy with LVEF 25 to 30%.  He is symptomatically stable in terms of chronic combined heart failure symptoms, weight has been stable.  As discussed previously he has declined further ischemic work-up and ICD.  At this point no active angina on present medical therapy.  Continue Coreg, Entresto, Aldactone, Zocor, and Lasix with potassium supplement.  2. Laminated apical LV mural thrombus, asymptomatic on Coumadin.  3. Multivessel CAD status post CABG with redo operation in 2016.  He is not on aspirin given use of Coumadin  Medication Adjustments/Labs and Tests Ordered: Current medicines are reviewed at length with the patient today.  Concerns regarding medicines are outlined above.   Tests Ordered: No orders of the defined types were placed in this encounter.   Medication Changes: No orders of the defined types were placed in this encounter.   Disposition:  Follow up 6 months in the [place] office.  Signed, Satira Sark, MD, [facility] 04/20/2020 2:13 PM    [facility] at [facility] [address] [place], [place] [address] Phone: [phone]; Fax: [phone]

## 2020-04-20 ENCOUNTER — Encounter: Payer: Self-pay | Admitting: Cardiology

## 2020-04-20 ENCOUNTER — Ambulatory Visit: Payer: Medicare HMO | Admitting: Cardiology

## 2020-04-20 ENCOUNTER — Other Ambulatory Visit: Payer: Self-pay

## 2020-04-20 VITALS — BP 116/68 | HR 78 | Ht 68.0 in | Wt 248.0 lb

## 2020-04-20 DIAGNOSIS — I513 Intracardiac thrombosis, not elsewhere classified: Secondary | ICD-10-CM

## 2020-04-20 DIAGNOSIS — I25119 Atherosclerotic heart disease of native coronary artery with unspecified angina pectoris: Secondary | ICD-10-CM

## 2020-04-20 DIAGNOSIS — I255 Ischemic cardiomyopathy: Secondary | ICD-10-CM

## 2020-04-20 NOTE — Patient Instructions (Signed)
Medication Instructions:  °Your physician recommends that you continue on your current medications as directed. Please refer to the Current Medication list given to you today. °*If you need a refill on your cardiac medications before your next appointment, please call your pharmacy* ° ° °Lab Work: °None today °If you have labs (blood work) drawn today and your tests are completely normal, you will receive your results only by: °• MyChart Message (if you have MyChart) OR °• A paper copy in the mail °If you have any lab test that is abnormal or we need to change your treatment, we will call you to review the results. ° ° °Testing/Procedures: °None today ° ° °Follow-Up: °At [facility], you and your health needs are our priority.  As part of our continuing mission to provide you with exceptional heart care, we have created designated Provider Care Teams.  These Care Teams include your primary Cardiologist (physician) and Advanced Practice Providers (APPs -  Physician Assistants and Nurse Practitioners) who all work together to provide you with the care you need, when you need it. ° °We recommend signing up for the patient portal called "MyChart".  Sign up information is provided on this After Visit Summary.  MyChart is used to connect with patients for Virtual Visits (Telemedicine).  Patients are able to view lab/test results, encounter notes, upcoming appointments, etc.  Non-urgent messages can be sent to your provider as well.   °To learn more about what you can do with MyChart, go to [email or web address].   ° °Your next appointment:   °6 month(s) ° °The format for your next appointment:   °In Person ° °Provider:   °Samuel McDowell, MD ° ° °Other Instructions °None today ° ° ° ° °Thank you for choosing [facility] ! ° ° ° ° ° ° ° ° °

## 2020-04-27 DIAGNOSIS — Z7901 Long term (current) use of anticoagulants: Secondary | ICD-10-CM | POA: Diagnosis not present

## 2020-04-27 DIAGNOSIS — I255 Ischemic cardiomyopathy: Secondary | ICD-10-CM | POA: Diagnosis not present

## 2020-05-04 DIAGNOSIS — I425 Other restrictive cardiomyopathy: Secondary | ICD-10-CM | POA: Diagnosis not present

## 2020-05-04 DIAGNOSIS — I700 Atherosclerosis of aorta: Secondary | ICD-10-CM | POA: Diagnosis not present

## 2020-05-04 DIAGNOSIS — I252 Old myocardial infarction: Secondary | ICD-10-CM | POA: Diagnosis not present

## 2020-05-04 DIAGNOSIS — E782 Mixed hyperlipidemia: Secondary | ICD-10-CM | POA: Diagnosis not present

## 2020-05-04 DIAGNOSIS — M170 Bilateral primary osteoarthritis of knee: Secondary | ICD-10-CM | POA: Diagnosis not present

## 2020-05-04 DIAGNOSIS — I129 Hypertensive chronic kidney disease with stage 1 through stage 4 chronic kidney disease, or unspecified chronic kidney disease: Secondary | ICD-10-CM | POA: Diagnosis not present

## 2020-05-04 DIAGNOSIS — Z8673 Personal history of transient ischemic attack (TIA), and cerebral infarction without residual deficits: Secondary | ICD-10-CM | POA: Diagnosis not present

## 2020-05-04 DIAGNOSIS — N1831 Chronic kidney disease, stage 3a: Secondary | ICD-10-CM | POA: Diagnosis not present

## 2020-05-04 DIAGNOSIS — I739 Peripheral vascular disease, unspecified: Secondary | ICD-10-CM | POA: Diagnosis not present

## 2020-05-27 DIAGNOSIS — I959 Hypotension, unspecified: Secondary | ICD-10-CM | POA: Diagnosis not present

## 2020-05-27 DIAGNOSIS — I425 Other restrictive cardiomyopathy: Secondary | ICD-10-CM | POA: Diagnosis not present

## 2020-05-27 DIAGNOSIS — Z23 Encounter for immunization: Secondary | ICD-10-CM | POA: Diagnosis not present

## 2020-05-27 DIAGNOSIS — Z0000 Encounter for general adult medical examination without abnormal findings: Secondary | ICD-10-CM | POA: Diagnosis not present

## 2020-05-27 DIAGNOSIS — Z7901 Long term (current) use of anticoagulants: Secondary | ICD-10-CM | POA: Diagnosis not present

## 2020-07-08 DIAGNOSIS — Z7901 Long term (current) use of anticoagulants: Secondary | ICD-10-CM | POA: Diagnosis not present

## 2020-07-08 DIAGNOSIS — D509 Iron deficiency anemia, unspecified: Secondary | ICD-10-CM | POA: Diagnosis not present

## 2020-07-08 DIAGNOSIS — I425 Other restrictive cardiomyopathy: Secondary | ICD-10-CM | POA: Diagnosis not present

## 2020-07-08 DIAGNOSIS — I480 Paroxysmal atrial fibrillation: Secondary | ICD-10-CM | POA: Diagnosis not present

## 2020-07-08 DIAGNOSIS — I10 Essential (primary) hypertension: Secondary | ICD-10-CM | POA: Diagnosis not present

## 2020-07-08 DIAGNOSIS — E782 Mixed hyperlipidemia: Secondary | ICD-10-CM | POA: Diagnosis not present

## 2020-07-08 DIAGNOSIS — I2510 Atherosclerotic heart disease of native coronary artery without angina pectoris: Secondary | ICD-10-CM | POA: Diagnosis not present

## 2020-07-08 DIAGNOSIS — R7303 Prediabetes: Secondary | ICD-10-CM | POA: Diagnosis not present

## 2020-08-05 DIAGNOSIS — Z7901 Long term (current) use of anticoagulants: Secondary | ICD-10-CM | POA: Diagnosis not present

## 2020-08-19 DIAGNOSIS — I739 Peripheral vascular disease, unspecified: Secondary | ICD-10-CM | POA: Diagnosis not present

## 2020-08-19 DIAGNOSIS — R0602 Shortness of breath: Secondary | ICD-10-CM | POA: Diagnosis not present

## 2020-08-19 DIAGNOSIS — I639 Cerebral infarction, unspecified: Secondary | ICD-10-CM | POA: Diagnosis not present

## 2020-08-19 DIAGNOSIS — I5022 Chronic systolic (congestive) heart failure: Secondary | ICD-10-CM | POA: Diagnosis not present

## 2020-08-19 DIAGNOSIS — N1831 Chronic kidney disease, stage 3a: Secondary | ICD-10-CM | POA: Diagnosis not present

## 2020-09-02 DIAGNOSIS — I480 Paroxysmal atrial fibrillation: Secondary | ICD-10-CM | POA: Diagnosis not present

## 2020-09-02 DIAGNOSIS — Z7901 Long term (current) use of anticoagulants: Secondary | ICD-10-CM | POA: Diagnosis not present

## 2020-09-19 DIAGNOSIS — N1831 Chronic kidney disease, stage 3a: Secondary | ICD-10-CM | POA: Diagnosis not present

## 2020-09-19 DIAGNOSIS — I639 Cerebral infarction, unspecified: Secondary | ICD-10-CM | POA: Diagnosis not present

## 2020-09-19 DIAGNOSIS — I5022 Chronic systolic (congestive) heart failure: Secondary | ICD-10-CM | POA: Diagnosis not present

## 2020-09-19 DIAGNOSIS — R0602 Shortness of breath: Secondary | ICD-10-CM | POA: Diagnosis not present

## 2020-09-19 DIAGNOSIS — I739 Peripheral vascular disease, unspecified: Secondary | ICD-10-CM | POA: Diagnosis not present

## 2020-09-23 DIAGNOSIS — Z7901 Long term (current) use of anticoagulants: Secondary | ICD-10-CM | POA: Diagnosis not present

## 2020-09-23 DIAGNOSIS — R310 Gross hematuria: Secondary | ICD-10-CM | POA: Diagnosis not present

## 2020-09-23 DIAGNOSIS — I10 Essential (primary) hypertension: Secondary | ICD-10-CM | POA: Diagnosis not present

## 2020-10-05 ENCOUNTER — Emergency Department (HOSPITAL_COMMUNITY): Payer: Medicare HMO

## 2020-10-05 ENCOUNTER — Other Ambulatory Visit: Payer: Self-pay

## 2020-10-05 ENCOUNTER — Inpatient Hospital Stay (HOSPITAL_COMMUNITY)
Admission: EM | Admit: 2020-10-05 | Discharge: 2020-10-11 | DRG: 183 | Disposition: A | Discharge status: Skilled Nursing Facility | Payer: Medicare HMO | Attending: Family Medicine | Admitting: Family Medicine

## 2020-10-05 DIAGNOSIS — Z85828 Personal history of other malignant neoplasm of skin: Secondary | ICD-10-CM

## 2020-10-05 DIAGNOSIS — R0602 Shortness of breath: Secondary | ICD-10-CM | POA: Diagnosis not present

## 2020-10-05 DIAGNOSIS — S2249XD Multiple fractures of ribs, unspecified side, subsequent encounter for fracture with routine healing: Secondary | ICD-10-CM | POA: Diagnosis not present

## 2020-10-05 DIAGNOSIS — I480 Paroxysmal atrial fibrillation: Secondary | ICD-10-CM | POA: Diagnosis present

## 2020-10-05 DIAGNOSIS — I10 Essential (primary) hypertension: Secondary | ICD-10-CM | POA: Diagnosis not present

## 2020-10-05 DIAGNOSIS — Z7901 Long term (current) use of anticoagulants: Secondary | ICD-10-CM | POA: Diagnosis not present

## 2020-10-05 DIAGNOSIS — R0609 Other forms of dyspnea: Secondary | ICD-10-CM | POA: Diagnosis not present

## 2020-10-05 DIAGNOSIS — I5023 Acute on chronic systolic (congestive) heart failure: Secondary | ICD-10-CM

## 2020-10-05 DIAGNOSIS — R262 Difficulty in walking, not elsewhere classified: Secondary | ICD-10-CM | POA: Diagnosis not present

## 2020-10-05 DIAGNOSIS — R2689 Other abnormalities of gait and mobility: Secondary | ICD-10-CM | POA: Diagnosis present

## 2020-10-05 DIAGNOSIS — J189 Pneumonia, unspecified organism: Secondary | ICD-10-CM | POA: Diagnosis not present

## 2020-10-05 DIAGNOSIS — R0600 Dyspnea, unspecified: Secondary | ICD-10-CM

## 2020-10-05 DIAGNOSIS — Y92009 Unspecified place in unspecified non-institutional (private) residence as the place of occurrence of the external cause: Secondary | ICD-10-CM

## 2020-10-05 DIAGNOSIS — I959 Hypotension, unspecified: Secondary | ICD-10-CM | POA: Diagnosis not present

## 2020-10-05 DIAGNOSIS — I9589 Other hypotension: Secondary | ICD-10-CM

## 2020-10-05 DIAGNOSIS — J9811 Atelectasis: Secondary | ICD-10-CM | POA: Diagnosis not present

## 2020-10-05 DIAGNOSIS — Z96659 Presence of unspecified artificial knee joint: Secondary | ICD-10-CM

## 2020-10-05 DIAGNOSIS — M6281 Muscle weakness (generalized): Secondary | ICD-10-CM | POA: Diagnosis not present

## 2020-10-05 DIAGNOSIS — S2249XA Multiple fractures of ribs, unspecified side, initial encounter for closed fracture: Secondary | ICD-10-CM | POA: Diagnosis present

## 2020-10-05 DIAGNOSIS — S2241XD Multiple fractures of ribs, right side, subsequent encounter for fracture with routine healing: Secondary | ICD-10-CM | POA: Diagnosis not present

## 2020-10-05 DIAGNOSIS — Z8673 Personal history of transient ischemic attack (TIA), and cerebral infarction without residual deficits: Secondary | ICD-10-CM

## 2020-10-05 DIAGNOSIS — E871 Hypo-osmolality and hyponatremia: Secondary | ICD-10-CM | POA: Diagnosis not present

## 2020-10-05 DIAGNOSIS — I739 Peripheral vascular disease, unspecified: Secondary | ICD-10-CM | POA: Diagnosis not present

## 2020-10-05 DIAGNOSIS — Z833 Family history of diabetes mellitus: Secondary | ICD-10-CM

## 2020-10-05 DIAGNOSIS — E785 Hyperlipidemia, unspecified: Secondary | ICD-10-CM | POA: Diagnosis not present

## 2020-10-05 DIAGNOSIS — R531 Weakness: Secondary | ICD-10-CM

## 2020-10-05 DIAGNOSIS — N179 Acute kidney failure, unspecified: Secondary | ICD-10-CM

## 2020-10-05 DIAGNOSIS — M1990 Unspecified osteoarthritis, unspecified site: Secondary | ICD-10-CM | POA: Diagnosis not present

## 2020-10-05 DIAGNOSIS — T502X5A Adverse effect of carbonic-anhydrase inhibitors, benzothiadiazides and other diuretics, initial encounter: Secondary | ICD-10-CM | POA: Diagnosis present

## 2020-10-05 DIAGNOSIS — R52 Pain, unspecified: Secondary | ICD-10-CM | POA: Diagnosis not present

## 2020-10-05 DIAGNOSIS — Z515 Encounter for palliative care: Secondary | ICD-10-CM | POA: Diagnosis not present

## 2020-10-05 DIAGNOSIS — I5020 Unspecified systolic (congestive) heart failure: Secondary | ICD-10-CM | POA: Diagnosis not present

## 2020-10-05 DIAGNOSIS — I110 Hypertensive heart disease with heart failure: Secondary | ICD-10-CM | POA: Diagnosis not present

## 2020-10-05 DIAGNOSIS — I513 Intracardiac thrombosis, not elsewhere classified: Secondary | ICD-10-CM | POA: Diagnosis present

## 2020-10-05 DIAGNOSIS — Z7189 Other specified counseling: Secondary | ICD-10-CM | POA: Diagnosis not present

## 2020-10-05 DIAGNOSIS — Z20822 Contact with and (suspected) exposure to covid-19: Secondary | ICD-10-CM | POA: Diagnosis not present

## 2020-10-05 DIAGNOSIS — D6859 Other primary thrombophilia: Secondary | ICD-10-CM | POA: Diagnosis present

## 2020-10-05 DIAGNOSIS — Z79899 Other long term (current) drug therapy: Secondary | ICD-10-CM

## 2020-10-05 DIAGNOSIS — T402X5A Adverse effect of other opioids, initial encounter: Secondary | ICD-10-CM | POA: Diagnosis present

## 2020-10-05 DIAGNOSIS — I252 Old myocardial infarction: Secondary | ICD-10-CM

## 2020-10-05 DIAGNOSIS — I255 Ischemic cardiomyopathy: Secondary | ICD-10-CM | POA: Diagnosis present

## 2020-10-05 DIAGNOSIS — E782 Mixed hyperlipidemia: Secondary | ICD-10-CM | POA: Diagnosis present

## 2020-10-05 DIAGNOSIS — I5043 Acute on chronic combined systolic (congestive) and diastolic (congestive) heart failure: Secondary | ICD-10-CM | POA: Diagnosis not present

## 2020-10-05 DIAGNOSIS — W010XXA Fall on same level from slipping, tripping and stumbling without subsequent striking against object, initial encounter: Secondary | ICD-10-CM | POA: Diagnosis present

## 2020-10-05 DIAGNOSIS — I2510 Atherosclerotic heart disease of native coronary artery without angina pectoris: Secondary | ICD-10-CM | POA: Diagnosis present

## 2020-10-05 DIAGNOSIS — N400 Enlarged prostate without lower urinary tract symptoms: Secondary | ICD-10-CM | POA: Diagnosis present

## 2020-10-05 DIAGNOSIS — K5903 Drug induced constipation: Secondary | ICD-10-CM | POA: Diagnosis not present

## 2020-10-05 DIAGNOSIS — I509 Heart failure, unspecified: Secondary | ICD-10-CM

## 2020-10-05 DIAGNOSIS — W19XXXA Unspecified fall, initial encounter: Secondary | ICD-10-CM | POA: Diagnosis not present

## 2020-10-05 DIAGNOSIS — Z7401 Bed confinement status: Secondary | ICD-10-CM | POA: Diagnosis not present

## 2020-10-05 DIAGNOSIS — M25519 Pain in unspecified shoulder: Secondary | ICD-10-CM | POA: Diagnosis not present

## 2020-10-05 DIAGNOSIS — S2241XA Multiple fractures of ribs, right side, initial encounter for closed fracture: Secondary | ICD-10-CM

## 2020-10-05 DIAGNOSIS — I25118 Atherosclerotic heart disease of native coronary artery with other forms of angina pectoris: Secondary | ICD-10-CM | POA: Diagnosis not present

## 2020-10-05 DIAGNOSIS — R279 Unspecified lack of coordination: Secondary | ICD-10-CM | POA: Diagnosis not present

## 2020-10-05 DIAGNOSIS — I482 Chronic atrial fibrillation, unspecified: Secondary | ICD-10-CM | POA: Diagnosis present

## 2020-10-05 DIAGNOSIS — J90 Pleural effusion, not elsewhere classified: Secondary | ICD-10-CM | POA: Diagnosis not present

## 2020-10-05 DIAGNOSIS — Z87891 Personal history of nicotine dependence: Secondary | ICD-10-CM

## 2020-10-05 DIAGNOSIS — Z951 Presence of aortocoronary bypass graft: Secondary | ICD-10-CM | POA: Diagnosis not present

## 2020-10-05 DIAGNOSIS — Z8249 Family history of ischemic heart disease and other diseases of the circulatory system: Secondary | ICD-10-CM

## 2020-10-05 DIAGNOSIS — M25511 Pain in right shoulder: Secondary | ICD-10-CM | POA: Diagnosis not present

## 2020-10-05 DIAGNOSIS — I248 Other forms of acute ischemic heart disease: Secondary | ICD-10-CM | POA: Diagnosis not present

## 2020-10-05 DIAGNOSIS — E861 Hypovolemia: Secondary | ICD-10-CM

## 2020-10-05 DIAGNOSIS — Z96652 Presence of left artificial knee joint: Secondary | ICD-10-CM

## 2020-10-05 DIAGNOSIS — Y929 Unspecified place or not applicable: Secondary | ICD-10-CM | POA: Diagnosis not present

## 2020-10-05 LAB — CBC WITH DIFFERENTIAL/PLATELET
Abs Immature Granulocytes: 0.1 10*3/uL — ABNORMAL HIGH (ref 0.00–0.07)
Basophils Absolute: 0.1 10*3/uL (ref 0.0–0.1)
Basophils Relative: 1 %
Eosinophils Absolute: 0.1 10*3/uL (ref 0.0–0.5)
Eosinophils Relative: 2 %
HCT: 42.3 % (ref 39.0–52.0)
Hemoglobin: 13.3 g/dL (ref 13.0–17.0)
Immature Granulocytes: 1 %
Lymphocytes Relative: 8 %
Lymphs Abs: 0.7 10*3/uL (ref 0.7–4.0)
MCH: 30.9 pg (ref 26.0–34.0)
MCHC: 31.4 g/dL (ref 30.0–36.0)
MCV: 98.1 fL (ref 80.0–100.0)
Monocytes Absolute: 0.6 10*3/uL (ref 0.1–1.0)
Monocytes Relative: 7 %
Neutro Abs: 7.2 10*3/uL (ref 1.7–7.7)
Neutrophils Relative %: 81 %
Platelets: 161 10*3/uL (ref 150–400)
RBC: 4.31 MIL/uL (ref 4.22–5.81)
RDW: 13.2 % (ref 11.5–15.5)
WBC: 8.8 10*3/uL (ref 4.0–10.5)
nRBC: 0 % (ref 0.0–0.2)

## 2020-10-05 LAB — BASIC METABOLIC PANEL
Anion gap: 7 (ref 5–15)
BUN: 31 mg/dL — ABNORMAL HIGH (ref 8–23)
CO2: 31 mmol/L (ref 22–32)
Calcium: 9.4 mg/dL (ref 8.9–10.3)
Chloride: 102 mmol/L (ref 98–111)
Creatinine, Ser: 1.13 mg/dL (ref 0.61–1.24)
GFR, Estimated: >60 mL/min (ref >60)
Glucose, Bld: 117 mg/dL — ABNORMAL HIGH (ref 70–99)
Potassium: 4.5 mmol/L (ref 3.5–5.1)
Sodium: 140 mmol/L (ref 135–145)

## 2020-10-05 LAB — PROTIME-INR
INR: 2 — ABNORMAL HIGH (ref 0.8–1.2)
Prothrombin Time: 23 seconds — ABNORMAL HIGH (ref 11.4–15.2)

## 2020-10-05 MED ORDER — TAMSULOSIN HCL 0.4 MG PO CAPS
0.4000 mg | ORAL_CAPSULE | Freq: Every morning | ORAL | Status: DC
  Administered 2020-10-06 – 2020-10-11 (×6): 0.4 mg via ORAL
  Filled 2020-10-05 (×5): qty 1

## 2020-10-05 MED ORDER — SACUBITRIL-VALSARTAN 24-26 MG PO TABS
1.0000 | ORAL_TABLET | Freq: Two times a day (BID) | ORAL | Status: DC
  Administered 2020-10-05 – 2020-10-07 (×4): 1 via ORAL
  Filled 2020-10-05 (×4): qty 1

## 2020-10-05 MED ORDER — ONDANSETRON HCL 4 MG PO TABS: 4.0000 mg | ORAL_TABLET | Freq: Four times a day (QID) | ORAL | Status: DC | PRN

## 2020-10-05 MED ORDER — FOLIC ACID 1 MG PO TABS
500.0000 ug | ORAL_TABLET | Freq: Every day | ORAL | Status: DC
  Administered 2020-10-05 – 2020-10-11 (×7): 0.5 mg via ORAL
  Filled 2020-10-05 (×7): qty 1

## 2020-10-05 MED ORDER — ONDANSETRON HCL 4 MG/2ML IJ SOLN: 4.0000 mg | Freq: Four times a day (QID) | INTRAMUSCULAR | Status: DC | PRN

## 2020-10-05 MED ORDER — FENTANYL CITRATE (PF) 100 MCG/2ML IJ SOLN
12.5000 ug | INTRAMUSCULAR | Status: DC | PRN
  Administered 2020-10-05 – 2020-10-11 (×3): 12.5 ug via INTRAVENOUS
  Filled 2020-10-05 (×6): qty 2

## 2020-10-05 MED ORDER — DOCUSATE SODIUM 100 MG PO CAPS
100.0000 mg | ORAL_CAPSULE | Freq: Every day | ORAL | Status: DC
  Administered 2020-10-05 – 2020-10-07 (×3): 100 mg via ORAL
  Filled 2020-10-05 (×3): qty 1

## 2020-10-05 MED ORDER — OXYCODONE HCL 5 MG PO TABS
5.0000 mg | ORAL_TABLET | ORAL | Status: DC | PRN
  Administered 2020-10-05 – 2020-10-08 (×9): 5 mg via ORAL
  Filled 2020-10-05 (×9): qty 1

## 2020-10-05 MED ORDER — WARFARIN - PHARMACIST DOSING INPATIENT: Freq: Every day | Status: DC

## 2020-10-05 MED ORDER — WARFARIN SODIUM 7.5 MG PO TABS
7.5000 mg | ORAL_TABLET | Freq: Once | ORAL | Status: AC
  Administered 2020-10-05: 7.5 mg via ORAL
  Filled 2020-10-05: qty 1

## 2020-10-05 MED ORDER — ACETAMINOPHEN 325 MG PO TABS
650.0000 mg | ORAL_TABLET | Freq: Four times a day (QID) | ORAL | Status: DC
  Administered 2020-10-05 – 2020-10-11 (×23): 650 mg via ORAL
  Filled 2020-10-05 (×24): qty 2

## 2020-10-05 MED ORDER — HYDROCODONE-ACETAMINOPHEN 5-325 MG PO TABS
1.0000 | ORAL_TABLET | Freq: Once | ORAL | Status: AC
Start: 2020-10-05 — End: 2020-10-05
  Administered 2020-10-05: 1 via ORAL
  Filled 2020-10-05: qty 1

## 2020-10-05 MED ORDER — ALBUTEROL SULFATE (2.5 MG/3ML) 0.083% IN NEBU
2.5000 mg | INHALATION_SOLUTION | Freq: Four times a day (QID) | RESPIRATORY_TRACT | Status: DC | PRN
  Administered 2020-10-09 – 2020-10-10 (×2): 2.5 mg via RESPIRATORY_TRACT
  Filled 2020-10-05 (×6): qty 3

## 2020-10-05 MED ORDER — SIMVASTATIN 20 MG PO TABS
40.0000 mg | ORAL_TABLET | Freq: Every day | ORAL | Status: DC
  Administered 2020-10-05 – 2020-10-10 (×6): 40 mg via ORAL
  Filled 2020-10-05 (×6): qty 2

## 2020-10-05 MED ORDER — POTASSIUM CHLORIDE CRYS ER 10 MEQ PO TBCR
10.0000 meq | EXTENDED_RELEASE_TABLET | Freq: Every day | ORAL | Status: DC
  Administered 2020-10-06 – 2020-10-11 (×6): 10 meq via ORAL
  Filled 2020-10-05 (×6): qty 1

## 2020-10-05 MED ORDER — FINASTERIDE 5 MG PO TABS
5.0000 mg | ORAL_TABLET | Freq: Every day | ORAL | Status: DC
  Administered 2020-10-06 – 2020-10-11 (×6): 5 mg via ORAL
  Filled 2020-10-05 (×6): qty 1

## 2020-10-05 MED ORDER — ACETAMINOPHEN 650 MG RE SUPP
650.0000 mg | Freq: Four times a day (QID) | RECTAL | Status: DC
  Filled 2020-10-05: qty 1

## 2020-10-05 MED ORDER — CARVEDILOL 3.125 MG PO TABS
3.1250 mg | ORAL_TABLET | Freq: Two times a day (BID) | ORAL | Status: DC
  Administered 2020-10-05 – 2020-10-09 (×9): 3.125 mg via ORAL
  Filled 2020-10-05 (×10): qty 1

## 2020-10-05 MED ORDER — SPIRONOLACTONE 25 MG PO TABS
12.5000 mg | ORAL_TABLET | Freq: Every day | ORAL | Status: DC
  Administered 2020-10-06 – 2020-10-11 (×6): 12.5 mg via ORAL
  Filled 2020-10-05: qty 0.5
  Filled 2020-10-05: qty 1
  Filled 2020-10-05 (×3): qty 0.5
  Filled 2020-10-05: qty 1
  Filled 2020-10-05: qty 0.5
  Filled 2020-10-05 (×2): qty 1
  Filled 2020-10-05: qty 0.5
  Filled 2020-10-05 (×2): qty 1
  Filled 2020-10-05: qty 0.5

## 2020-10-05 MED ORDER — ASCORBIC ACID 500 MG PO TABS
1000.0000 mg | ORAL_TABLET | Freq: Two times a day (BID) | ORAL | Status: DC
  Administered 2020-10-05 – 2020-10-11 (×13): 1000 mg via ORAL
  Filled 2020-10-05 (×13): qty 2

## 2020-10-05 MED ORDER — ACETAMINOPHEN 500 MG PO TABS
1000.0000 mg | ORAL_TABLET | Freq: Every day | ORAL | Status: DC
  Administered 2020-10-06: 1000 mg via ORAL
  Filled 2020-10-05: qty 2

## 2020-10-05 MED ORDER — FUROSEMIDE 40 MG PO TABS
40.0000 mg | ORAL_TABLET | Freq: Two times a day (BID) | ORAL | Status: DC
  Administered 2020-10-05 – 2020-10-06 (×2): 40 mg via ORAL
  Filled 2020-10-05 (×2): qty 1

## 2020-10-05 MED ORDER — DIPHENHYDRAMINE HCL 25 MG PO CAPS
50.0000 mg | ORAL_CAPSULE | Freq: Every day | ORAL | Status: DC
  Administered 2020-10-05 – 2020-10-09 (×5): 50 mg via ORAL
  Filled 2020-10-05 (×5): qty 2

## 2020-10-05 NOTE — ED Provider Notes (Signed)
[facility] EMERGENCY DEPARTMENT Provider Note   CSN: [id number] Arrival date & time: 10/05/20  1130     History Chief Complaint  Patient presents with   Shoulder Pain    Benjamin Cordova is a 86 y.o. male.  HPI     86 year old male comes in with chief complaint of shoulder pain.  Patient has history of CAD, stroke.  He reports that on 4 July he was sitting on his walker, the bills came off and he fell.  He fell onto his right side and is having severe pain since then.  He did go home, but has been unable to do anything at home.  It is hard for him to get up and do simple activities like cook meal, drink water, go to the bathroom.  Ultimately decided to come to the ER because he is not getting better.  He did take some pain medications at home that did not help significantly.  It is painful for him to breathe.  He denies any cough.  Review of system is also negative for any headaches, nausea, vomiting, confusion.  Past Medical History:  Diagnosis Date   Arthritis    Coronary artery disease    a. s/p CABG in 1981 b. cath in 10/2014 showing occluded grafts --> redo CABG in 2016 LIMA-LAD and Left Radial-OM2   Essential hypertension    Hematuria    History of blood transfusion 07/2015   Knee replacement HgB <9   History of pneumonia    History of stroke    Ischemic cardiomyopathy    Left ventricular apical thrombus    Mixed hyperlipidemia    Myocardial infarction [facility]) 1980   Peripheral vascular disease ([place])    Polio osteopathy of lower leg (H[facility]) Age 50   Affected right leg   Skin cancer    TIA (transient ischemic attack) 1989   Chronic coumadin   UTI (lower urinary tract infection)     Patient Active Problem List   Diagnosis Date Noted   Multiple rib fractures 10/05/2020   Uncontrolled pain 10/05/2020   AKI (acute kidney injury) ([place])    Weakness 06/30/2019   Shortness of breath 06/30/2019   Subtherapeutic anticoagulation    Acute on chronic respiratory failure with  hypoxia and hypercapnia (HCC)    Acute on chronic systolic HF (heart failure) (H[facility])    LV (left ventricular) mural thrombus 50/53/9767   Systolic and diastolic CHF, acute on chronic (HCC)    Hypokalemia    CHF (congestive heart failure) ([place]) 09/01/2015   Respiratory failure with hypoxia ([place]) 08/31/2015   HCAP (healthcare-associated pneumonia) 08/31/2015   Acute respiratory failure with hypoxia and hypercapnia ([place])    Encounter for central line placement    Knee swelling    Subtherapeutic international normalized ratio (INR) 08/08/2015   Constipation 08/08/2015   Hyperlipidemia 08/08/2015   Blurred vision 07/29/2015   Anemia 07/29/2015   S/P total knee replacement 07/27/2015   S/P CABG x 2 10/08/2014   Chest pain 10/01/2014   Elevated INR 10/01/2014   Leukocytosis 10/01/2014   Thrombocytopenia ([place]) 10/01/2014   Chest pain at rest 10/01/2014   Unstable angina ([place]) 10/01/2014   Coagulopathy ([place]) 10/01/2014   Paroxysmal atrial fibrillation ([place]) 10/01/2014   Coronary artery disease    Hypertension    Arthritis    Peripheral vascular disease ([place])    Essential hypertension     Past Surgical History:  Procedure Laterality Date   CARDIAC CATHETERIZATION N/A 10/05/2014  Procedure: Left Heart Cath and Coronary Angiography;  Surgeon: Peter M Martinique, MD;  Location: [place] [facility];  Service: Cardiovascular;  Laterality: N/A;   CORONARY ARTERY BYPASS GRAFT  1981   [facility]   CORONARY ARTERY BYPASS GRAFT N/A 10/08/2014   Procedure: REDO CORONARY ARTERY BYPASS GRAFTING (CABG) Times Two Grafts using Left Internal Mammary and Left Radial Artery;  Surgeon: Melrose Nakayama, MD;  Location: [place];  Service: Open Heart Surgery;  Laterality: N/A;   CYSTOSCOPY WITH INJECTION  10/10/2010   Procedure: CYSTOSCOPY WITH INJECTION;  Surgeon: Marissa Nestle;  [facility];  Service: Urology;  Laterality: N/A;  with retrograde urethrogram   FOOT SURGERY Right 1954   [facility], muscle  implantation   JOINT REPLACEMENT     RADIAL ARTERY HARVEST Left 10/08/2014   Procedure: Left RADIAL ARTERY HARVEST;  Surgeon: Melrose Nakayama, MD;  Location: [place];  Service: Open Heart Surgery;  Laterality: Left;   TEE WITHOUT CARDIOVERSION N/A 10/08/2014   Procedure: TRANSESOPHAGEAL ECHOCARDIOGRAM (TEE);  Surgeon: Melrose Nakayama, MD;  Location: [place];  Service: Open Heart Surgery;  Laterality: N/A;   TONSILLECTOMY  1949   [facility]   TOTAL KNEE ARTHROPLASTY Left 07/27/2015   Procedure: TOTAL LEFT KNEE ARTHROPLASTY;  Surgeon: [name], MD;  Location: [place];  Service: Orthopedics;  Laterality: Left;       Family History  Problem Relation Age of Onset   Heart attack Mother    Diabetes Mother    Heart attack Father    Heart attack Brother    Anesthesia problems Neg Hx    Hypotension Neg Hx    Malignant hyperthermia Neg Hx    Pseudochol deficiency Neg Hx     Social History   Tobacco Use   Smoking status: Former    Packs/day: 1.00    Years: 28.00    Pack years: 28.00    Types: Cigarettes    Quit date: 09/22/1978    Years since quitting: 42.0   Smokeless tobacco: Never  Vaping Use   Vaping Use: Never used  Substance Use Topics   Alcohol use: No    Alcohol/week: 0.0 standard drinks   Drug use: No    Home Medications Prior to Admission medications   Medication Sig Start Date End Date Taking? Authorizing Provider  HYDROcodone-acetaminophen (NORCO/VICODIN) 5-325 MG tablet Take by mouth. 10/04/20 10/09/20 Yes [provider]  simvastatin (ZOCOR) 40 MG tablet Take by mouth. 07/11/20  Yes [provider]  albuterol (PROVENTIL) (2.5 MG/3ML) 0.083% nebulizer solution Take 2.5 mg by nebulization every 6 (six) hours as needed for wheezing or shortness of breath.  08/29/19   [provider]  ascorbic acid (VITAMIN C) 500 MG tablet Take by mouth.    [provider]  B Complex-C-Folic Acid (SUPER B-COMPLEX/VIT C/FA) TABS Take 1 tablet by mouth  daily.    [provider]  carvedilol (COREG) 6.25 MG tablet Take 1 tablet (6.25 mg total) by mouth 2 (two) times daily with a meal. 07/04/19   Barton Dubois, MD  diphenhydramine-acetaminophen (TYLENOL PM) 25-500 MG TABS tablet Take 2 tablets by mouth at bedtime.     [provider]  doxycycline (VIBRA-TABS) 100 MG tablet Take 100 mg by mouth 2 (two) times daily. 09/23/20   [provider]  finasteride (PROSCAR) 5 MG tablet Take 5 mg by mouth daily. 04/20/19   [provider]  folic acid (FOLVITE) 176 MCG tablet Take 400 mcg by  mouth daily.    [provider]  furosemide (LASIX) 40 MG tablet Take 40 mg by mouth 2 (two) times daily.    [provider]  Astrid Drafts (OMEGA-3) 500 MG CAPS Take 1 capsule by mouth at bedtime.     [provider]  Multiple Vitamins-Minerals (CERTAVITE/ANTIOXIDANTS) TABS Take 1 tablet by mouth daily.     [provider]  potassium chloride (K-DUR) 10 MEQ tablet Take 10 mEq by mouth daily. Take with Lasix (Furosemide) 03/18/18   [provider]  sacubitril-valsartan (ENTRESTO) 24-26 MG Take 1 tablet by mouth 2 (two) times daily. 07/04/19   Barton Dubois, MD  simvastatin (ZOCOR) 40 MG tablet Take 40 mg by mouth at bedtime.     [provider]  spironolactone (ALDACTONE) 25 MG tablet Take 0.5 tablets (12.5 mg total) by mouth daily. 11/10/19 02/08/20  Strader, Fransisco Hertz, PA-C  tamsulosin (FLOMAX) 0.4 MG CAPS capsule Take 0.4 mg by mouth in the morning.  12/02/18   [provider]  vitamin C (ASCORBIC ACID) 500 MG tablet Take 1,000 mg by mouth 2 (two) times daily.    [provider]  warfarin (COUMADIN) 5 MG tablet Take 1.5 tablets (7.5 mg total) by mouth daily at 6 PM. OR AS DIRECTED by MD. 09/07/15   Modena Jansky, MD    Allergies    Patient has no known allergies.  Review of Systems   Review of Systems  Constitutional:  Positive for activity change.  Respiratory:   Positive for shortness of breath.   Cardiovascular:  Positive for chest pain.  Musculoskeletal:  Positive for arthralgias.  Hematological:  Does not bruise/bleed easily.  All other systems reviewed and are negative.  Physical Exam Updated Vital Signs BP 111/63   Pulse 100   Temp 99.1 F (37.3 C)   Resp 18   Ht 5\' 8"  (1.727 m)   Wt 112.5 kg   SpO2 93%   BMI 37.71 kg/m   Physical Exam Vitals and nursing note reviewed.  Constitutional:      Appearance: He is well-developed.  HENT:     Head: Atraumatic.  Cardiovascular:     Rate and Rhythm: Normal rate.  Pulmonary:     Effort: Pulmonary effort is normal.  Musculoskeletal:     Cervical back: Neck supple.     Comments: Significant tenderness over the posterior right sided chest wall, and lateral chest wall diffusely.  No ecchymosis.  Skin:    General: Skin is warm.  Neurological:     Mental Status: He is alert and oriented to person, place, and time.    ED Results / Procedures / Treatments   Labs (all labs ordered are listed, but only abnormal results are displayed) Labs Reviewed  BASIC METABOLIC PANEL - Abnormal; Notable for the following components:      Result Value   Glucose, Bld 117 (*)    BUN 31 (*)    All other components within normal limits  CBC WITH DIFFERENTIAL/PLATELET - Abnormal; Notable for the following components:   Abs Immature Granulocytes 0.10 (*)    All other components within normal limits  PROTIME-INR    EKG None  Radiology DG Ribs Unilateral W/Chest Right  Result Date: 10/05/2020 CLINICAL DATA:  Right chest pain after a fall today. Initial encounter. EXAM: RIGHT RIBS AND CHEST - 3+ VIEW COMPARISON:  CT chest and single view of the chest 09/15/2019. FINDINGS: The patient has acute fractures of right sixth and seventh ribs. The  right sixth rib fracture is nondisplaced. There is mild superior displacement of the distal fragment of the seventh rib. Small left pleural effusion and chronic basilar  atelectasis appear unchanged. Heart size is upper normal. The patient is status post CABG. No pneumothorax is identified. IMPRESSION: Acute right sixth and seventh rib fractures. Negative for pneumothorax. No change in a small left pleural effusion and basilar atelectasis. Electronically Signed   By: Inge Rise M.D.   On: 10/05/2020 14:14   DG Shoulder Right  Result Date: 10/05/2020 CLINICAL DATA:  Status post fall.  Right shoulder pain. EXAM: RIGHT SHOULDER - 2+ VIEW COMPARISON:  None. FINDINGS: There is no evidence of fracture or dislocation. Mild degenerative changes noted at the acromioclavicular joint. There is an acute fracture involving the posterolateral aspect of the approximate right seventh rib. Soft tissues are unremarkable. IMPRESSION: Acute fracture involves the posterolateral aspect of the approximate right seventh rib. No signs of shoulder fracture. Electronically Signed   By: Kerby Moors M.D.   On: 10/05/2020 13:44    Procedures .Critical Care  Date/Time: 10/05/2020 3:17 PM Performed by: Varney Biles, MD Authorized by: Varney Biles, MD   Critical care provider statement:    Critical care time (minutes):  33   Critical care was necessary to treat or prevent imminent or life-threatening deterioration of the following conditions:  Trauma   Critical care was time spent personally by me on the following activities:  Discussions with consultants, evaluation of patient's response to treatment, examination of patient, ordering and performing treatments and interventions, ordering and review of laboratory studies, ordering and review of radiographic studies, pulse oximetry, re-evaluation of patient's condition, obtaining history from patient or surrogate and review of old charts   Medications Ordered in ED Medications  acetaminophen (TYLENOL) tablet 650 mg (has no administration in time range)    Or  acetaminophen (TYLENOL) suppository 650 mg (has no administration in time  range)  oxyCODONE (Oxy IR/ROXICODONE) immediate release tablet 5 mg (has no administration in time range)  fentaNYL (SUBLIMAZE) injection 12.5 mcg (has no administration in time range)  docusate sodium (COLACE) capsule 100 mg (has no administration in time range)  ondansetron (ZOFRAN) tablet 4 mg (has no administration in time range)    Or  ondansetron (ZOFRAN) injection 4 mg (has no administration in time range)  HYDROcodone-acetaminophen (NORCO/VICODIN) 5-325 MG per tablet 1 tablet (1 tablet Oral Given 10/05/20 1446)    ED Course  I have reviewed the triage vital signs and the nursing notes.  Pertinent labs & imaging results that were available during my care of the patient were reviewed by me and considered in my medical decision making (see chart for details).    MDM Rules/Calculators/A&P                          86 year old male comes in with chief complaint of shoulder pain.  On exam he has mostly thoracic pain over the right lateral and posterior region.  Patient is unable to function at home.  He could not even sit up for me from the bed because of his pain.  Patient lives by himself, and likely going to get worse.  He is also having chest pain with breathing, and noted to have O2 sats at 93%, high risk for complication from the rib fractures including pneumonia and mortality.  X-rays ordered and they do confirm at least 2 ribs that are broken.  My suspicion is that multiple  ribs are broken, I do not think CT is going to add significant value given that treatment would not change.  Results discussed with the patient, he prefers being admitted given significant discomfort and inability to function at home.  Medicine to admit.  Brain and C-spine cleared clinically.  Abdominal exam was benign, no ecchymosis no need for any CT imaging of the torso at this time.   Final Clinical Impression(s) / ED Diagnoses Final diagnoses:  Closed fracture of multiple ribs of right side, initial  encounter    Rx / DC Orders ED Discharge Orders     None        Varney Biles, MD 10/05/20 [phone]

## 2020-10-05 NOTE — ED Triage Notes (Signed)
Reports fall on Monday and injured R shoulder.  Reports that the wheels fell off his walker and he fell to the ground.  Reports pain to R shoulder.  Pt is alert and oriented.  Resp even and unlabored.

## 2020-10-05 NOTE — H&P (Signed)
History and Physical  [place] XTG:[id number] DOB: 28-Mar-1935 DOA: 10/05/2020  PCP: Celene Squibb, MD  Patient coming from: Home  Level of care: Med-Surg  I have personally briefly reviewed patient's old medical records in [place]  Chief Complaint: right shoulder pain after fall  HPI: Benjamin Cordova is a 86 y.o. male with medical history significant for long-term gait instability managed with a walker, CAD s/p CABG, HTN, OA, ischemic cardiomyopathy, afib on warfarin, hyperlipidemia who had been doing fairly well living alone at home.  Unfortunately during the July 4 celebration he slipped down and fell on his right side when the wheels slipped from his walker.  He has been having severe pain on the right side of his body since that time.  He reports that he went home but he has been unable to perform activities of daily living like cooking or dressing or bathing due to severe pain involving the right side of his body.  He also has severe shoulder pain on the right side.  He reports that he did not hit his head.  There was no syncope.  There is no chest pain or shortness of breath or seizure activity associated with his fall.  ED Course: Patient was evaluated in the emergency department and noted to have stable labs.  He was sent for imaging of the right shoulder and ribs with findings of the right sixth and seventh ribs.  No other acute findings noted.  In addition he was evaluated and noted to be in severe pain and reports that he is not able to go home and care for himself due to uncontrolled pain.  Admission was requested for pain management.  Review of Systems: Review of Systems  Constitutional:  Negative for chills, fever, malaise/fatigue and weight loss.  HENT:  Negative for ear pain, hearing loss, nosebleeds and tinnitus.   Eyes: Negative.   Respiratory:  Negative for cough, hemoptysis, shortness of breath and wheezing.   Cardiovascular:  Negative for  chest pain and palpitations.  Musculoskeletal:  Positive for falls, joint pain and myalgias. Negative for back pain.       Chest wall rib cage pain and discomfort   Skin:  Negative for itching and rash.  Neurological: Negative.   Endo/Heme/Allergies: Negative.   Psychiatric/Behavioral: Negative.    All other systems reviewed and are negative.   Past Medical History:  Diagnosis Date   Arthritis    Coronary artery disease    a. s/p CABG in 1981 b. cath in 10/2014 showing occluded grafts --> redo CABG in 2016 LIMA-LAD and Left Radial-OM2   Essential hypertension    Hematuria    History of blood transfusion 07/2015   Knee replacement HgB <9   History of pneumonia    History of stroke    Ischemic cardiomyopathy    Left ventricular apical thrombus    Mixed hyperlipidemia    Myocardial infarction [facility]) 1980   Peripheral vascular disease ([place])    Polio osteopathy of lower leg ([place]) Age 76   Affected right leg   Skin cancer    TIA (transient ischemic attack) 1989   Chronic coumadin   UTI (lower urinary tract infection)     Past Surgical History:  Procedure Laterality Date   CARDIAC CATHETERIZATION N/A 10/05/2014   Procedure: Left Heart Cath and Coronary Angiography;  Surgeon: Peter M Martinique, MD;  Location: [facility];  Service: Cardiovascular;  Laterality: N/A;  CORONARY ARTERY BYPASS GRAFT  1981   [facility]   CORONARY ARTERY BYPASS GRAFT N/A 10/08/2014   Procedure: REDO CORONARY ARTERY BYPASS GRAFTING (CABG) Times Two Grafts using Left Internal Mammary and Left Radial Artery;  Surgeon: Melrose Nakayama, MD;  Location: [place];  Service: Open Heart Surgery;  Laterality: N/A;   CYSTOSCOPY WITH INJECTION  10/10/2010   Procedure: CYSTOSCOPY WITH INJECTION;  Surgeon: Marissa Nestle;  Location: [facility];  Service: Urology;  Laterality: N/A;  with retrograde urethrogram   FOOT SURGERY Right 1954   [facility], muscle implantation   JOINT REPLACEMENT     RADIAL ARTERY HARVEST Left  10/08/2014   Procedure: Left RADIAL ARTERY HARVEST;  Surgeon: Melrose Nakayama, MD;  Location: [place];  Service: Open Heart Surgery;  Laterality: Left;   TEE WITHOUT CARDIOVERSION N/A 10/08/2014   Procedure: TRANSESOPHAGEAL ECHOCARDIOGRAM (TEE);  Surgeon: Melrose Nakayama, MD;  Location: [place];  Service: Open Heart Surgery;  Laterality: N/A;   TONSILLECTOMY  1949   [facility]   TOTAL KNEE ARTHROPLASTY Left 07/27/2015   Procedure: TOTAL LEFT KNEE ARTHROPLASTY;  Surgeon: [name], MD;  Location: [place];  Service: Orthopedics;  Laterality: Left;     reports that he quit smoking about 42 years ago. His smoking use included cigarettes. He has a 28.00 pack-year smoking history. He has never used smokeless tobacco. He reports that he does not drink alcohol and does not use drugs.  No Known Allergies  Family History  Problem Relation Age of Onset   Heart attack Mother    Diabetes Mother    Heart attack Father    Heart attack Brother    Anesthesia problems Neg Hx    Hypotension Neg Hx    Malignant hyperthermia Neg Hx    Pseudochol deficiency Neg Hx     Prior to Admission medications   Medication Sig Start Date End Date Taking? Authorizing Provider  albuterol (PROVENTIL) (2.5 MG/3ML) 0.083% nebulizer solution Take 2.5 mg by nebulization every 6 (six) hours as needed for wheezing or shortness of breath.  08/29/19   [provider]  B Complex-C-Folic Acid (SUPER B-COMPLEX/VIT C/FA) TABS Take 1 tablet by mouth daily.    [provider]  carvedilol (COREG) 6.25 MG tablet Take 1 tablet (6.25 mg total) by mouth 2 (two) times daily with a meal. 07/04/19   Barton Dubois, MD  diphenhydramine-acetaminophen (TYLENOL PM) 25-500 MG TABS tablet Take 2 tablets by mouth at bedtime.     [provider]  finasteride (PROSCAR) 5 MG tablet Take 5 mg by mouth daily. 04/20/19   [provider]  folic acid (FOLVITE) 443 MCG tablet Take 400 mcg by mouth daily.    [provider]  furosemide (LASIX) 40 MG tablet Take 40 mg by mouth 2 (two) times daily.    [provider]  Astrid Drafts (OMEGA-3) 500 MG CAPS Take 1 capsule by mouth at bedtime.     [provider]  Multiple Vitamins-Minerals (CERTAVITE/ANTIOXIDANTS) TABS Take 1 tablet by mouth daily.     [provider]  potassium chloride (K-DUR) 10 MEQ tablet Take 10 mEq by mouth daily. Take with Lasix (Furosemide) 03/18/18   [provider]  sacubitril-valsartan (ENTRESTO) 24-26 MG Take 1 tablet by mouth 2 (two) times daily. 07/04/19   Barton Dubois, MD  simvastatin (ZOCOR) 40 MG tablet Take 40 mg by mouth at bedtime.     [provider]  spironolactone (ALDACTONE) 25 MG tablet  Take 0.5 tablets (12.5 mg total) by mouth daily. 11/10/19 02/08/20  Strader, Fransisco Hertz, PA-C  tamsulosin (FLOMAX) 0.4 MG CAPS capsule Take 0.4 mg by mouth in the morning.  12/02/18   [provider]  vitamin C (ASCORBIC ACID) 500 MG tablet Take 1,000 mg by mouth 2 (two) times daily.    [provider]  warfarin (COUMADIN) 5 MG tablet Take 1.5 tablets (7.5 mg total) by mouth daily at 6 PM. OR AS DIRECTED by MD. 09/07/15   Modena Jansky, MD    Physical Exam: Vitals:   10/05/20 1139 10/05/20 1141  BP: 111/63   Pulse: 100   Resp: 18   Temp: 99.1 F (37.3 C)   SpO2: 93%   Weight:  112.5 kg  Height:  5\' 8"  (1.727 m)    Constitutional: Awake, alert chronically ill-appearing male, lying supine on the gurney, NAD, calm, moderately distressed. Eyes: PERRL, lids and conjunctivae normal ENMT: Mucous membranes are moist. Posterior pharynx clear of any exudate or lesions.poor dentition Neck: normal, supple, no masses, no thyromegaly Respiratory: No increased work of breathing however noted to have shallow breathing.  Bruising and tenderness of the right chest wall and severe tenderness along the right sixth and seventh rib cage.  Cardiovascular: normal s1, s2 sounds, no murmurs /  rubs / gallops. No extremity edema. 2+ pedal pulses. No carotid bruits.  Abdomen: no tenderness, no masses palpated. No hepatosplenomegaly. Bowel sounds positive.  Musculoskeletal: no clubbing / cyanosis. No joint deformity upper and lower extremities. Good ROM, no contractures. Normal muscle tone.  Skin: no rashes, lesions, ulcers. No induration Neurologic: CN 2-12 grossly intact. Sensation intact, DTR normal. Strength 5/5 in all 4.  Psychiatric: Normal judgment and insight. Alert and oriented x 3. Normal mood.   Labs on Admission: I have personally reviewed following labs and imaging studies  CBC: Recent Labs  Lab 10/05/20 1352  WBC 8.8  NEUTROABS 7.2  HGB 13.3  HCT 42.3  MCV 98.1  PLT 811   Basic Metabolic Panel: Recent Labs  Lab 10/05/20 1352  NA 140  K 4.5  CL 102  CO2 31  GLUCOSE 117*  BUN 31*  CREATININE 1.13  CALCIUM 9.4   GFR: Estimated Creatinine Clearance: 60.3 mL/min (by C-G formula based on SCr of 1.13 mg/dL). Liver Function Tests: No results for input(s): AST, ALT, ALKPHOS, BILITOT, PROT, ALBUMIN in the last 168 hours. No results for input(s): LIPASE, AMYLASE in the last 168 hours. No results for input(s): AMMONIA in the last 168 hours. Coagulation Profile: No results for input(s): INR, PROTIME in the last 168 hours. Cardiac Enzymes: No results for input(s): CKTOTAL, CKMB, CKMBINDEX, TROPONINI in the last 168 hours. BNP (last 3 results) No results for input(s): PROBNP in the last 8760 hours. HbA1C: No results for input(s): HGBA1C in the last 72 hours. CBG: No results for input(s): GLUCAP in the last 168 hours. Lipid Profile: No results for input(s): CHOL, HDL, LDLCALC, TRIG, CHOLHDL, LDLDIRECT in the last 72 hours. Thyroid Function Tests: No results for input(s): TSH, T4TOTAL, FREET4, T3FREE, THYROIDAB in the last 72 hours. Anemia Panel: No results for input(s): VITAMINB12, FOLATE, FERRITIN, TIBC, IRON, RETICCTPCT in the last 72 hours. Urine  analysis:    Component Value Date/Time   COLORURINE YELLOW 07/01/2019 0445   APPEARANCEUR CLOUDY (A) 07/01/2019 0445   LABSPEC 1.027 07/01/2019 0445   PHURINE 5.0 07/01/2019 0445   GLUCOSEU NEGATIVE 07/01/2019 0445   HGBUR SMALL (A) 07/01/2019 0445   BILIRUBINUR  NEGATIVE 07/01/2019 [place] 07/01/2019 0445   PROTEINUR NEGATIVE 07/01/2019 0445   UROBILINOGEN 1.0 10/07/2014 1638   NITRITE NEGATIVE 07/01/2019 0445   LEUKOCYTESUR LARGE (A) 07/01/2019 0445    Radiological Exams on Admission: DG Ribs Unilateral W/Chest Right  Result Date: 10/05/2020 CLINICAL DATA:  Right chest pain after a fall today. Initial encounter. EXAM: RIGHT RIBS AND CHEST - 3+ VIEW COMPARISON:  CT chest and single view of the chest 09/15/2019. FINDINGS: The patient has acute fractures of right sixth and seventh ribs. The right sixth rib fracture is nondisplaced. There is mild superior displacement of the distal fragment of the seventh rib. Small left pleural effusion and chronic basilar atelectasis appear unchanged. Heart size is upper normal. The patient is status post CABG. No pneumothorax is identified. IMPRESSION: Acute right sixth and seventh rib fractures. Negative for pneumothorax. No change in a small left pleural effusion and basilar atelectasis. Electronically Signed   By: Inge Rise M.D.   On: 10/05/2020 14:14   DG Shoulder Right  Result Date: 10/05/2020 CLINICAL DATA:  Status post fall.  Right shoulder pain. EXAM: RIGHT SHOULDER - 2+ VIEW COMPARISON:  None. FINDINGS: There is no evidence of fracture or dislocation. Mild degenerative changes noted at the acromioclavicular joint. There is an acute fracture involving the posterolateral aspect of the approximate right seventh rib. Soft tissues are unremarkable. IMPRESSION: Acute fracture involves the posterolateral aspect of the approximate right seventh rib. No signs of shoulder fracture. Electronically Signed   By: Kerby Moors M.D.   On:  10/05/2020 13:44    Assessment/Plan Principal Problem:   Multiple rib fractures Active Problems:   Coronary artery disease   Hypertension   Arthritis   Peripheral vascular disease (HCC)   Paroxysmal atrial fibrillation (HCC)   S/P CABG x 2   S/P total knee replacement   CHF (congestive heart failure) (HCC)   Weakness   Uncontrolled pain   Multiple Rib Fractures-patient had a recent fall at home and now with significant pain and discomfort not able to perform ADLs.  Admit for pain management.  Rib binder ordered.  Fall at home-reportedly slipped as the wheels came off the walker.  Plan for PT evaluation for ambulation.  Consult to [facility] team for possible skilled nursing rehabilitation placement.  Uncontrolled Pain-secondary to acute rib fractures multiple rib fractures-planning scheduled Tylenol 650 mg every 6 hours around-the-clock in addition to oxycodone every 4-6 hours as needed for breakthrough pain and if has additional pain after that what order fentanyl 12.5 mcg every 2 hours as needed pain.  Laxatives ordered.  Fall precautions recommended.  CAD s/p CABG/ischemic cardiomyopathy-resume all home cardiac medications.  Currently compensated and stable.  Chronic atrial fibrillation-resume home carvedilol 3.125 mg twice daily for rate control and warfarin for full anticoagulation.  Acquired thrombophilia-patient is on warfarin for full anticoagulation consulted Pharm.D. for assistance with managing warfarin in the hospital.  Follow daily PT/INR testing.  DVT prophylaxis: warfarin  Code Status: Full   Family Communication: bedside update   Disposition Plan: TBD   Consults called: PT   Admission status: OBV   Level of care: Med-Surg Irwin Brakeman MD [facility] How to contact the [facility] Attending or Consulting provider [place] or covering provider during after hours [place], for this patient?  Check the care team in [facility] and look for a) attending/consulting TRH provider  listed and b) the [facility] team listed Log into [email or web address] and use [place]'s universal password  to access. If you do not have the password, please contact the hospital operator. Locate the [facility] provider you are looking for under [facility] and page to a number that you can be directly reached. If you still have difficulty reaching the provider, please page the [facility] (Director on Call) for the Hospitalists listed on amion for assistance.   If 7PM-7AM, please contact night-coverage [email or web address] Password [email or web address]  10/05/2020, 3:05 PM

## 2020-10-05 NOTE — Progress Notes (Signed)
ANTICOAGULATION CONSULT NOTE - Initial Consult  Pharmacy Consult for warfarin Indication: atrial fibrillation  No Known Allergies  Patient Measurements: Height: 5\' 8"  (172.7 cm) Weight: 112.5 kg (248 lb 0.3 oz) IBW/kg (Calculated) : 68.4 Heparin Dosing Weight:   Vital Signs: Temp: 99.1 F (37.3 C) (07/06 1139) BP: 111/63 (07/06 1139) Pulse Rate: 100 (07/06 1139)  Labs: Recent Labs    10/05/20 1352  HGB 13.3  HCT 42.3  PLT 161  CREATININE 1.13    Estimated Creatinine Clearance: 60.3 mL/min (by C-G formula based on SCr of 1.13 mg/dL).   Medical History: Past Medical History:  Diagnosis Date   Arthritis    Coronary artery disease    a. s/p CABG in 1981 b. cath in 10/2014 showing occluded grafts --> redo CABG in 2016 LIMA-LAD and Left Radial-OM2   Essential hypertension    Hematuria    History of blood transfusion 07/2015   Knee replacement HgB <9   History of pneumonia    History of stroke    Ischemic cardiomyopathy    Left ventricular apical thrombus    Mixed hyperlipidemia    Myocardial infarction [facility]) 1980   Peripheral vascular disease ([place])    Polio osteopathy of lower leg ([place]) Age 86   Affected right leg   Skin cancer    TIA (transient ischemic attack) 1989   Chronic coumadin   UTI (lower urinary tract infection)     Medications:  (Not in a hospital admission)   Assessment: Pharmacy consulted to dose warfarin in patient with atrial fibrillation. INR on admission is 2.0.  Home dose listed as 5 mg on Friday and 10 mg ROW. Last dose listed as 7/3 @ 2100.  He was recently seen by his physician for hematuria on 6/24.  CBC WNL  Goal of Therapy:  INR 2-3 Monitor platelets by anticoagulation protocol: Yes   Plan:  Warfarin 7.5 mg x 1 dose. Monitor daily INR and s/s of bleeding.  Margot Ables, PharmD Clinical Pharmacist 10/05/2020 3:54 PM

## 2020-10-06 ENCOUNTER — Observation Stay (HOSPITAL_COMMUNITY): Payer: Medicare HMO

## 2020-10-06 ENCOUNTER — Encounter (HOSPITAL_COMMUNITY): Payer: Self-pay | Admitting: Family Medicine

## 2020-10-06 DIAGNOSIS — T402X5A Adverse effect of other opioids, initial encounter: Secondary | ICD-10-CM | POA: Diagnosis present

## 2020-10-06 DIAGNOSIS — E785 Hyperlipidemia, unspecified: Secondary | ICD-10-CM | POA: Diagnosis not present

## 2020-10-06 DIAGNOSIS — I2510 Atherosclerotic heart disease of native coronary artery without angina pectoris: Secondary | ICD-10-CM | POA: Diagnosis present

## 2020-10-06 DIAGNOSIS — I959 Hypotension, unspecified: Secondary | ICD-10-CM | POA: Diagnosis present

## 2020-10-06 DIAGNOSIS — W010XXA Fall on same level from slipping, tripping and stumbling without subsequent striking against object, initial encounter: Secondary | ICD-10-CM | POA: Diagnosis present

## 2020-10-06 DIAGNOSIS — Y92009 Unspecified place in unspecified non-institutional (private) residence as the place of occurrence of the external cause: Secondary | ICD-10-CM | POA: Diagnosis not present

## 2020-10-06 DIAGNOSIS — I110 Hypertensive heart disease with heart failure: Secondary | ICD-10-CM | POA: Diagnosis present

## 2020-10-06 DIAGNOSIS — I5043 Acute on chronic combined systolic (congestive) and diastolic (congestive) heart failure: Secondary | ICD-10-CM | POA: Diagnosis not present

## 2020-10-06 DIAGNOSIS — I248 Other forms of acute ischemic heart disease: Secondary | ICD-10-CM | POA: Diagnosis present

## 2020-10-06 DIAGNOSIS — R0600 Dyspnea, unspecified: Secondary | ICD-10-CM | POA: Diagnosis not present

## 2020-10-06 DIAGNOSIS — N179 Acute kidney failure, unspecified: Secondary | ICD-10-CM | POA: Diagnosis not present

## 2020-10-06 DIAGNOSIS — Z515 Encounter for palliative care: Secondary | ICD-10-CM | POA: Diagnosis not present

## 2020-10-06 DIAGNOSIS — M1990 Unspecified osteoarthritis, unspecified site: Secondary | ICD-10-CM | POA: Diagnosis not present

## 2020-10-06 DIAGNOSIS — I5023 Acute on chronic systolic (congestive) heart failure: Secondary | ICD-10-CM | POA: Diagnosis not present

## 2020-10-06 DIAGNOSIS — R0602 Shortness of breath: Secondary | ICD-10-CM | POA: Diagnosis not present

## 2020-10-06 DIAGNOSIS — E782 Mixed hyperlipidemia: Secondary | ICD-10-CM | POA: Diagnosis present

## 2020-10-06 DIAGNOSIS — I482 Chronic atrial fibrillation, unspecified: Secondary | ICD-10-CM | POA: Diagnosis not present

## 2020-10-06 DIAGNOSIS — J90 Pleural effusion, not elsewhere classified: Secondary | ICD-10-CM | POA: Diagnosis not present

## 2020-10-06 DIAGNOSIS — S2241XA Multiple fractures of ribs, right side, initial encounter for closed fracture: Principal | ICD-10-CM

## 2020-10-06 DIAGNOSIS — Y929 Unspecified place or not applicable: Secondary | ICD-10-CM | POA: Diagnosis not present

## 2020-10-06 DIAGNOSIS — I25118 Atherosclerotic heart disease of native coronary artery with other forms of angina pectoris: Secondary | ICD-10-CM | POA: Diagnosis not present

## 2020-10-06 DIAGNOSIS — R2689 Other abnormalities of gait and mobility: Secondary | ICD-10-CM | POA: Diagnosis present

## 2020-10-06 DIAGNOSIS — I10 Essential (primary) hypertension: Secondary | ICD-10-CM | POA: Diagnosis not present

## 2020-10-06 DIAGNOSIS — I513 Intracardiac thrombosis, not elsewhere classified: Secondary | ICD-10-CM | POA: Diagnosis present

## 2020-10-06 DIAGNOSIS — S2241XD Multiple fractures of ribs, right side, subsequent encounter for fracture with routine healing: Secondary | ICD-10-CM | POA: Diagnosis not present

## 2020-10-06 DIAGNOSIS — Z7189 Other specified counseling: Secondary | ICD-10-CM

## 2020-10-06 DIAGNOSIS — E871 Hypo-osmolality and hyponatremia: Secondary | ICD-10-CM | POA: Diagnosis not present

## 2020-10-06 DIAGNOSIS — I255 Ischemic cardiomyopathy: Secondary | ICD-10-CM | POA: Diagnosis present

## 2020-10-06 DIAGNOSIS — I252 Old myocardial infarction: Secondary | ICD-10-CM | POA: Diagnosis not present

## 2020-10-06 DIAGNOSIS — N400 Enlarged prostate without lower urinary tract symptoms: Secondary | ICD-10-CM | POA: Diagnosis present

## 2020-10-06 DIAGNOSIS — D6859 Other primary thrombophilia: Secondary | ICD-10-CM | POA: Diagnosis present

## 2020-10-06 DIAGNOSIS — Z8673 Personal history of transient ischemic attack (TIA), and cerebral infarction without residual deficits: Secondary | ICD-10-CM | POA: Diagnosis not present

## 2020-10-06 DIAGNOSIS — S2249XA Multiple fractures of ribs, unspecified side, initial encounter for closed fracture: Secondary | ICD-10-CM | POA: Diagnosis present

## 2020-10-06 DIAGNOSIS — J9811 Atelectasis: Secondary | ICD-10-CM | POA: Diagnosis not present

## 2020-10-06 DIAGNOSIS — Z20822 Contact with and (suspected) exposure to covid-19: Secondary | ICD-10-CM | POA: Diagnosis not present

## 2020-10-06 DIAGNOSIS — R0609 Other forms of dyspnea: Secondary | ICD-10-CM | POA: Diagnosis not present

## 2020-10-06 DIAGNOSIS — I739 Peripheral vascular disease, unspecified: Secondary | ICD-10-CM | POA: Diagnosis present

## 2020-10-06 DIAGNOSIS — Z951 Presence of aortocoronary bypass graft: Secondary | ICD-10-CM | POA: Diagnosis not present

## 2020-10-06 DIAGNOSIS — T502X5A Adverse effect of carbonic-anhydrase inhibitors, benzothiadiazides and other diuretics, initial encounter: Secondary | ICD-10-CM | POA: Diagnosis present

## 2020-10-06 DIAGNOSIS — Z7901 Long term (current) use of anticoagulants: Secondary | ICD-10-CM | POA: Diagnosis not present

## 2020-10-06 DIAGNOSIS — Z85828 Personal history of other malignant neoplasm of skin: Secondary | ICD-10-CM | POA: Diagnosis not present

## 2020-10-06 DIAGNOSIS — I5020 Unspecified systolic (congestive) heart failure: Secondary | ICD-10-CM | POA: Diagnosis not present

## 2020-10-06 DIAGNOSIS — K5903 Drug induced constipation: Secondary | ICD-10-CM | POA: Diagnosis not present

## 2020-10-06 LAB — CBC
HCT: 40.5 % (ref 39.0–52.0)
Hemoglobin: 12.3 g/dL — ABNORMAL LOW (ref 13.0–17.0)
MCH: 31.1 pg (ref 26.0–34.0)
MCHC: 30.4 g/dL (ref 30.0–36.0)
MCV: 102.5 fL — ABNORMAL HIGH (ref 80.0–100.0)
Platelets: 137 10*3/uL — ABNORMAL LOW (ref 150–400)
RBC: 3.95 MIL/uL — ABNORMAL LOW (ref 4.22–5.81)
RDW: 13.3 % (ref 11.5–15.5)
WBC: 7.4 10*3/uL (ref 4.0–10.5)
nRBC: 0 % (ref 0.0–0.2)

## 2020-10-06 LAB — TROPONIN I (HIGH SENSITIVITY)
Troponin I (High Sensitivity): 24 ng/L — ABNORMAL HIGH (ref <18)
Troponin I (High Sensitivity): 27 ng/L — ABNORMAL HIGH (ref <18)

## 2020-10-06 LAB — SARS CORONAVIRUS 2 (TAT 6-24 HRS): SARS Coronavirus 2: NEGATIVE

## 2020-10-06 LAB — PROTIME-INR
INR: 2 — ABNORMAL HIGH (ref 0.8–1.2)
Prothrombin Time: 22.9 seconds — ABNORMAL HIGH (ref 11.4–15.2)

## 2020-10-06 MED ORDER — LEVALBUTEROL HCL 1.25 MG/0.5ML IN NEBU: 1.2500 mg | INHALATION_SOLUTION | Freq: Four times a day (QID) | RESPIRATORY_TRACT | Status: DC

## 2020-10-06 MED ORDER — SODIUM CHLORIDE 0.9 % IV BOLUS
500.0000 mL | Freq: Once | INTRAVENOUS | Status: AC
  Administered 2020-10-06: 500 mL via INTRAVENOUS

## 2020-10-06 MED ORDER — WARFARIN SODIUM 7.5 MG PO TABS
7.5000 mg | ORAL_TABLET | Freq: Once | ORAL | Status: AC
  Administered 2020-10-06: 7.5 mg via ORAL
  Filled 2020-10-06: qty 1

## 2020-10-06 MED ORDER — LEVALBUTEROL HCL 0.63 MG/3ML IN NEBU
INHALATION_SOLUTION | RESPIRATORY_TRACT | Status: AC
  Filled 2020-10-06: qty 3

## 2020-10-06 MED ORDER — CHLORHEXIDINE GLUCONATE CLOTH 2 % EX PADS
6.0000 | MEDICATED_PAD | Freq: Every day | CUTANEOUS | Status: DC
  Administered 2020-10-06 – 2020-10-11 (×6): 6 via TOPICAL

## 2020-10-06 MED ORDER — LEVALBUTEROL HCL 1.25 MG/0.5ML IN NEBU
1.2500 mg | INHALATION_SOLUTION | Freq: Two times a day (BID) | RESPIRATORY_TRACT | Status: DC
  Administered 2020-10-07 – 2020-10-08 (×3): 1.25 mg via RESPIRATORY_TRACT
  Filled 2020-10-06 (×3): qty 0.5

## 2020-10-06 MED ORDER — LEVALBUTEROL HCL 1.25 MG/0.5ML IN NEBU
1.2500 mg | INHALATION_SOLUTION | Freq: Four times a day (QID) | RESPIRATORY_TRACT | Status: DC
  Administered 2020-10-06: 1.25 mg via RESPIRATORY_TRACT
  Filled 2020-10-06: qty 0.5

## 2020-10-06 MED ORDER — GUAIFENESIN-DM 100-10 MG/5ML PO SYRP
5.0000 mL | ORAL_SOLUTION | ORAL | Status: DC | PRN
  Administered 2020-10-06 – 2020-10-09 (×3): 5 mL via ORAL
  Filled 2020-10-06 (×3): qty 5

## 2020-10-06 MED ORDER — KETOROLAC TROMETHAMINE 15 MG/ML IJ SOLN: 15.0000 mg | Freq: Four times a day (QID) | INTRAMUSCULAR | Status: DC | PRN

## 2020-10-06 MED ORDER — KETOROLAC TROMETHAMINE 15 MG/ML IJ SOLN
15.0000 mg | Freq: Four times a day (QID) | INTRAMUSCULAR | Status: DC | PRN
  Administered 2020-10-06 – 2020-10-07 (×2): 15 mg via INTRAVENOUS
  Filled 2020-10-06 (×2): qty 1

## 2020-10-06 MED ORDER — FUROSEMIDE 40 MG PO TABS: 40.0000 mg | ORAL_TABLET | Freq: Two times a day (BID) | ORAL | Status: DC

## 2020-10-06 NOTE — Progress Notes (Addendum)
10/06/2020 3:13 PM  Rapid response called.  Came to assess and found patient hypotensive, pulse ox 60-70, BP 80/60, pale and diaphoretic and tachycardic. Pt complained of SOB and back pain.  Ordered EKG, bolus NS 500, xopenex neb treatment, placed on NRB and pulse improved to 100%.  Port CXR ordered.  HS troponin ordered.  Transfer to SDU.  Will follow. Given his advanced cardiomyopathy and advanced age I think that goals of care discussion begin and will consult to palliative care team.  Telephone call to both sons attempted for update but no answer to either attempt.    Murvin Natal, MD How to contact the [facility] Attending or Consulting provider [place] or covering provider during after hours [place], for this patient?  Check the care team in [facility] and look for a) attending/consulting TRH provider listed and b) the [facility] team listed Log into [email or web address] and use [place]'s universal password to access. If you do not have the password, please contact the hospital operator. Locate the [facility] provider you are looking for under [facility] and page to a number that you can be directly reached. If you still have difficulty reaching the provider, please page the [facility] (Director on Call) for the Hospitalists listed on amion for assistance.

## 2020-10-06 NOTE — Plan of Care (Signed)
  Problem: Acute Rehab PT Goals(only PT should resolve) Goal: Pt Will Go Supine/Side To Sit Description: Mod I  Outcome: Progressing Goal: Pt Will Transfer Bed To Chair/Chair To Bed Description: Mod I Outcome: Progressing Goal: Pt Will Ambulate Description: Mod I with rw x 189ft  Outcome: Progressing Goal: Pt Will Go Up/Down Stairs Description: 3 steps with handrail with min assist  Outcome: Progressing

## 2020-10-06 NOTE — Progress Notes (Signed)
   10/06/20 1350  Assess: MEWS Score  Temp 98.1 F (36.7 C)  BP (!) 80/64  Pulse Rate 93  Resp 18  SpO2 95 %  O2 Device Room Air  Assess: MEWS Score  MEWS Temp 0  MEWS Systolic 2  MEWS Pulse 0  MEWS RR 0  MEWS LOC 0  MEWS Score 2  MEWS Score Color Yellow  Assess: if the MEWS score is Yellow or Red  Were vital signs taken at a resting state? Yes  Focused Assessment Change from prior assessment (see assessment flowsheet)  Early Detection of Sepsis Score *See Row Information* Low  MEWS guidelines implemented *See Row Information* Yes  Treat  MEWS Interventions Escalated (See documentation below);Consulted Respiratory Therapy;Other (Comment) (rapid response called)  Take Vital Signs  Increase Vital Sign Frequency  Yellow: Q 2hr X 2 then Q 4hr X 2, if remains yellow, continue Q 4hrs  Escalate  MEWS: Escalate Yellow: discuss with charge nurse/RN and consider discussing with provider and RRT  Notify: Charge Nurse/RN  Name of Charge Nurse/RN Notified Associate Professor, RN  Date Charge Nurse/RN Notified 10/06/20  Time Charge Nurse/RN Notified 1430  Notify: Provider  Provider Name/Title dr Wynetta Emery  Date Provider Notified 10/06/20  Time Provider Notified 1430  Notification Type Page  Notification Reason Change in status  Provider response See new orders;Other (Comment) (transfer to stepdown, EKG and bolus per orders with dr Wynetta Emery at bedside)  Date of Provider Response 10/06/20  Time of Provider Response 1435  Notify: Rapid Response  Name of Rapid Response RN Notified mary mills, RN  Date Rapid Response Notified 10/06/20  Time Rapid Response Notified 7215  Document  Patient Outcome Transferred/level of care increased (transferring to stepdown unit)  Progress note created (see row info) Yes

## 2020-10-06 NOTE — Progress Notes (Signed)
PROGRESS NOTE   Benjamin Cordova  YHC:[id number] DOB: Jun 02, 1934 DOA: 10/05/2020 PCP: Celene Squibb, MD   Chief Complaint  Patient presents with   Shoulder Pain   Level of care: Med-Surg  Brief Admission History:  86 y.o. male with medical history significant for long-term gait instability managed with a walker, CAD s/p CABG, HTN, OA, ischemic cardiomyopathy, afib on warfarin, hyperlipidemia who had been doing fairly well living alone at home.  Unfortunately during the July 4 celebration he slipped down and fell on his right side when the wheels slipped from his walker.  He has been having severe pain on the right side of his body since that time.  He reports that he went home but he has been unable to perform activities of daily living like cooking or dressing or bathing due to severe pain involving the right side of his body.  He also has severe shoulder pain on the right side.  He reports that he did not hit his head.  There was no syncope.  There is no chest pain or shortness of breath or seizure activity associated with his fall.  Assessment & Plan:   Principal Problem:   Multiple rib fractures Active Problems:   Coronary artery disease   Hypertension   Arthritis   Peripheral vascular disease (HCC)   Paroxysmal atrial fibrillation (HCC)   S/P CABG x 2   S/P total knee replacement   CHF (congestive heart failure) (HCC)   Weakness   Uncontrolled pain  Multiple Rib Fractures-patient had a recent fall at home and now with significant pain and discomfort not able to perform ADLs.  Admitted for pain management.  Rib binder ordered.  Ambulate with PT.  Schedule tylenol. Out of bed with assistance today.    Fall at home-reportedly slipped as the wheels came off the walker.  Plan for PT evaluation for ambulation.  Consult to [facility] team for possible skilled nursing rehabilitation placement.   Uncontrolled Pain-secondary to acute rib fractures multiple rib fractures-planning scheduled Tylenol  650 mg every 6 hours around-the-clock in addition to oxycodone every 4-6 hours as needed for breakthrough pain and if has additional pain after that what order fentanyl 12.5 mcg every 2 hours as needed pain.  Laxatives ordered.  Fall precautions recommended.   CAD s/p CABG/ischemic cardiomyopathy-resumed all home cardiac medications.  Currently compensated and stable.   Chronic atrial fibrillation-resume home carvedilol 3.125 mg twice daily for rate control and warfarin for full anticoagulation.   Acquired thrombophilia-patient is on warfarin for full anticoagulation consulted Pharm.D. for assistance with managing warfarin in the hospital.  Follow daily PT/INR testing.   DVT prophylaxis: warfarin  Code Status: Full   Family Communication: bedside update   Disposition Plan: TBD   Consults called: PT   Admission status: OBV    The patient remains OBS appropriate and will d/c before 2 midnights.  Dispo: The patient is from: Home              Anticipated d/c is to:  TBD              Patient currently is not medically stable to d/c.  Awaiting dispo recs   Difficult to place patient No   Consultants:  PT   Procedures:  N/a  Antimicrobials:  N/a   Subjective: Pt reports that his pain is better controlled today.  He still is afraid to get up out of bed but willing to work with PT.  No BM reported.  No SOB and no CP symptoms.    Objective: Vitals:   10/05/20 1707 10/05/20 2108 10/06/20 0109 10/06/20 0530  BP: (!) 149/73 108/66 131/74 (!) 87/49  Pulse: 92 94 98 70  Resp: 18 19 20 19   Temp: 97.7 F (36.5 C) 97.8 F (36.6 C) 97.8 F (36.6 C) 98 F (36.7 C)  TempSrc: Oral Oral Oral Oral  SpO2: 93% 97% 96% 92%  Weight:      Height:        Intake/Output Summary (Last 24 hours) at 10/06/2020 0920 Last data filed at 10/05/2020 2038 Gross per 24 hour  Intake --  Output 350 ml  Net -350 ml   Filed Weights   10/05/20 1141  Weight: 112.5 kg    Examination:  General exam:  Appears calm and comfortable  Respiratory system: shallow breathing bilateral, right rib cage bruising seen. Cardiovascular system: normal S1 & S2 heard. No JVD, murmurs, rubs, gallops or clicks. No pedal edema. Gastrointestinal system: Abdomen is nondistended, soft and nontender. No organomegaly or masses felt. Normal bowel sounds heard. Central nervous system: Alert and oriented. No focal neurological deficits. Extremities: Symmetric 5 x 5 power. Skin: No rashes, lesions or ulcers Psychiatry: Judgement and insight appear poor. Mood & affect flat.   Data Reviewed: I have personally reviewed following labs and imaging studies  CBC: Recent Labs  Lab 10/05/20 1352 10/06/20 0607  WBC 8.8 7.4  NEUTROABS 7.2  --   HGB 13.3 12.3*  HCT 42.3 40.5  MCV 98.1 102.5*  PLT 161 137*    Basic Metabolic Panel: Recent Labs  Lab 10/05/20 1352  NA 140  K 4.5  CL 102  CO2 31  GLUCOSE 117*  BUN 31*  CREATININE 1.13  CALCIUM 9.4    GFR: Estimated Creatinine Clearance: 60.3 mL/min (by C-G formula based on SCr of 1.13 mg/dL).  Liver Function Tests: No results for input(s): AST, ALT, ALKPHOS, BILITOT, PROT, ALBUMIN in the last 168 hours.  CBG: No results for input(s): GLUCAP in the last 168 hours.  Recent Results (from the past 240 hour(s))  SARS CORONAVIRUS 2 (TAT 6-24 HRS) Nasopharyngeal Nasopharyngeal Swab     Status: None   Collection Time: 10/05/20  3:36 PM   Specimen: Nasopharyngeal Swab  Result Value Ref Range Status   SARS Coronavirus 2 NEGATIVE NEGATIVE Final    Comment: (NOTE) SARS-CoV-2 target nucleic acids are NOT DETECTED.  The SARS-CoV-2 RNA is generally detectable in upper and lower respiratory specimens during the acute phase of infection. Negative results do not preclude SARS-CoV-2 infection, do not rule out co-infections with other pathogens, and should not be used as the sole basis for treatment or other patient management decisions. Negative results must be  combined with clinical observations, patient history, and epidemiological information. The expected result is Negative.  Fact Sheet for Patients: SugarRoll.be  Fact Sheet for Healthcare Providers: https://www.woods-mathews.com/  This test is not yet approved or cleared by the [place] FDA and  has been authorized for detection and/or diagnosis of SARS-CoV-2 by FDA under an Emergency Use Authorization (EUA). This EUA will remain  in effect (meaning this test can be used) for the duration of the COVID-19 declaration under Se ction 564(b)(1) of the Act, 21 U.S.C. section 360bbb-3(b)(1), unless the authorization is terminated or revoked sooner.  Performed at [facility], [place] [address], [place], [place] [address]      Radiology Studies: DG Ribs Unilateral W/Chest Right  Result Date: 10/05/2020 CLINICAL DATA:  Right chest pain after a fall today. Initial encounter. EXAM: RIGHT RIBS AND CHEST - 3+ VIEW COMPARISON:  CT chest and single view of the chest 09/15/2019. FINDINGS: The patient has acute fractures of right sixth and seventh ribs. The right sixth rib fracture is nondisplaced. There is mild superior displacement of the distal fragment of the seventh rib. Small left pleural effusion and chronic basilar atelectasis appear unchanged. Heart size is upper normal. The patient is status post CABG. No pneumothorax is identified. IMPRESSION: Acute right sixth and seventh rib fractures. Negative for pneumothorax. No change in a small left pleural effusion and basilar atelectasis. Electronically Signed   By: Inge Rise M.D.   On: 10/05/2020 14:14   DG Shoulder Right  Result Date: 10/05/2020 CLINICAL DATA:  Status post fall.  Right shoulder pain. EXAM: RIGHT SHOULDER - 2+ VIEW COMPARISON:  None. FINDINGS: There is no evidence of fracture or dislocation. Mild degenerative changes noted at the acromioclavicular joint. There is an acute fracture  involving the posterolateral aspect of the approximate right seventh rib. Soft tissues are unremarkable. IMPRESSION: Acute fracture involves the posterolateral aspect of the approximate right seventh rib. No signs of shoulder fracture. Electronically Signed   By: Kerby Moors M.D.   On: 10/05/2020 13:44    Scheduled Meds:  acetaminophen  650 mg Oral Q6H   Or   acetaminophen  650 mg Rectal Q6H   acetaminophen  1,000 mg Oral QHS   ascorbic acid  1,000 mg Oral BID   carvedilol  3.125 mg Oral BID WC   diphenhydrAMINE  50 mg Oral QHS   docusate sodium  100 mg Oral QHS   finasteride  5 mg Oral Daily   folic acid  540 mcg Oral Daily   furosemide  40 mg Oral BID   potassium chloride  10 mEq Oral Daily   sacubitril-valsartan  1 tablet Oral BID   simvastatin  40 mg Oral QHS   spironolactone  12.5 mg Oral Daily   tamsulosin  0.4 mg Oral q AM   Warfarin - Pharmacist Dosing Inpatient   Does not apply q1600   Continuous Infusions:   LOS: 0 days   Time spent: 35 mins   Denarius Sesler Wynetta Emery, MD How to contact the [facility] Attending or Consulting provider [place] or covering provider during after hours [place], for this patient?  Check the care team in [facility] and look for a) attending/consulting [facility] provider listed and b) the [facility] team listed Log into [email or web address] and use [place]'s universal password to access. If you do not have the password, please contact the hospital operator. Locate the [facility] provider you are looking for under [facility] and page to a number that you can be directly reached. If you still have difficulty reaching the provider, please page the [facility] (Director on Call) for the Hospitalists listed on amion for assistance.  10/06/2020, 9:20 AM

## 2020-10-06 NOTE — Consult Note (Signed)
Consultation Note Date: 10/06/2020   Patient Name: Benjamin Cordova  DOB: Sep 16, 1934  MRN: [id number]  Age / Sex: 86 y.o., male  PCP: Celene Squibb, MD Referring Physician: Murlean Iba, MD  Reason for Consultation: Establishing goals of care  HPI/Patient Profile: 86 y.o. male  with past medical history of long-term gait instability managed with a walker, CAD s/p CABG, HTNHLD, OA, ischemic cardiomyopathy, afib on warfarin, living alone at home, hearing loss, unfortunately during the July 4 celebration he slipped down and fell admitted on 10/05/2020 with multiple rib fractures.   Clinical Assessment and Goals of Care: I have reviewed medical records including EPIC notes, labs and imaging, received report from RN, assessed the patient.  Benjamin Cordova is lying quietly in bed.  He greets me making and mostly keeping eye contact.  He has severe hearing loss, but is able to make his basic needs known.  There is no family at bedside at this time.  We talked about his acute concern, and his sudden issues with breathing.  He tells me that he feels much better.  He tells me that he feels this happened because he was sitting up in the chair for too long.  We discussed a brief life review of the patient.  Benjamin Cordova tells me that he has 2 sons, Tax inspector and [place].  He tells me that he had a daughter who died from stage IV cancer in 07-02-17 and that he also lost his wife in 07-02-2017.  He shared a grandson and her brother also died in 2018-03-04.  I introduced Palliative Medicine as specialized medical care for people living with serious illness. It focuses on providing relief from the symptoms and stress of a serious illness. The goal is to improve quality of life for both the patient and the family.  We talked about healthcare power of attorney, see below.  We talked about CODE STATUS.  I reassured Benjamin Cordova that he does not look  like he is unstable, but out of respect we talked about these issues.  He tells me that he would not want life support, but he tells me that he has not discussed this with his family.  Due to his severe hearing loss, I will wait and have conversations about CODE STATUS with family present.   Limited discussion today due to [name]'s hearing loss.  He tells me that he would not want life support, but has not had this discussion with his sons.  Due to his hearing loss and advanced age, it is important that his sons be involved in this discussion.  I reassure him that this is normal discussions that we have with everyone, and that he is stable and in no danger.  Discussed the importance of continued conversation with family and the medical providers regarding overall plan of care and treatment options, ensuring decisions are within the context of the patient's values and GOCs.    Questions and concerns were addressed. The family was encouraged to call  with questions or concerns.  PMT will continue to support holistically.  Conference with bedside nursing staff related to patient condition, needs, goals of care.   HCPOA   NEXT OF KIN -Benjamin Cordova names his sons, Larinda Buttery and [place] as his healthcare surrogates.  He shares that his wife died in 07/09/2017 as did his daughter who had stage IV cancer.  He shares that he also lost a grandson and a brother in November 2019.    SUMMARY OF RECOMMENDATIONS   At this point continue to treat the treatable. Time for outcomes. Anticipate possible need for short-term rehab. Outpatient palliative should follow for GOC/CODE STATUS discussions   Code Status/Advance Care Planning: Full code -discussion limited by hearing loss.  Benjamin Cordova tells me that he would not want life support, but has not had these discussions with his family.   Symptom Management:  Per hospitalist, no additional needs at this time.  Palliative Prophylaxis:  Frequent Pain Assessment and  Turn Reposition  Additional Recommendations (Limitations, Scope, Preferences): Full Scope Treatment  Psycho-social/Spiritual:  Desire for further Chaplaincy support:no Additional Recommendations: Caregiving  Support/Resources and ICU Family Guide  Prognosis:  Unable to determine, based on outcomes.  Guarded at this point.  Discharge Planning:  To be determined, based on outcomes.  Need for short-term rehab would not be surprising.       Primary Diagnoses: Present on Admission:  Multiple rib fractures  Uncontrolled pain  Coronary artery disease  Hypertension  Arthritis  Peripheral vascular disease (HCC)  Paroxysmal atrial fibrillation ([place])   I have reviewed the medical record, interviewed the patient and family, and examined the patient. The following aspects are pertinent.  Past Medical History:  Diagnosis Date   Arthritis    Coronary artery disease    a. s/p CABG in 10-Jul-1979 b. cath in 10/2014 showing occluded grafts --> redo CABG in Jul 10, 2014 LIMA-LAD and Left Radial-OM2   Essential hypertension    Hematuria    History of blood transfusion 07/2015   Knee replacement HgB <9   History of pneumonia    History of stroke    Ischemic cardiomyopathy    Left ventricular apical thrombus    Mixed hyperlipidemia    Myocardial infarction [facility]) 1980   Peripheral vascular disease (HCC)    Polio osteopathy of lower leg (HCC) Age 62   Affected right leg   Skin cancer    TIA (transient ischemic attack) 1989   Chronic coumadin   UTI (lower urinary tract infection)    Social History   Socioeconomic History   Marital status: Widowed    Spouse name: Not on file   Number of children: Not on file   Years of education: Not on file   Highest education level: Not on file  Occupational History   Not on file  Tobacco Use   Smoking status: Former    Packs/day: 1.00    Years: 28.00    Pack years: 28.00    Types: Cigarettes    Quit date: 09/22/1978    Years since quitting: 42.0    Smokeless tobacco: Never  Vaping Use   Vaping Use: Never used  Substance and Sexual Activity   Alcohol use: No    Alcohol/week: 0.0 standard drinks   Drug use: No   Sexual activity: Not on file  Other Topics Concern   Not on file  Social History Narrative   Not on file   Social Determinants of Health   Financial Resource Strain: Not on  file  Food Insecurity: Not on file  Transportation Needs: Not on file  Physical Activity: Not on file  Stress: Not on file  Social Connections: Not on file   Family History  Problem Relation Age of Onset   Heart attack Mother    Diabetes Mother    Heart attack Father    Heart attack Brother    Anesthesia problems Neg Hx    Hypotension Neg Hx    Malignant hyperthermia Neg Hx    Pseudochol deficiency Neg Hx    Scheduled Meds:  acetaminophen  650 mg Oral Q6H   Or   acetaminophen  650 mg Rectal Q6H   acetaminophen  1,000 mg Oral QHS   ascorbic acid  1,000 mg Oral BID   carvedilol  3.125 mg Oral BID WC   Chlorhexidine Gluconate Cloth  6 each Topical Daily   diphenhydrAMINE  50 mg Oral QHS   docusate sodium  100 mg Oral QHS   finasteride  5 mg Oral Daily   folic acid  182 mcg Oral Daily   [START ON 10/07/2020] furosemide  40 mg Oral BID   levalbuterol  1.25 mg Nebulization Q6H   potassium chloride  10 mEq Oral Daily   sacubitril-valsartan  1 tablet Oral BID   simvastatin  40 mg Oral QHS   spironolactone  12.5 mg Oral Daily   tamsulosin  0.4 mg Oral q AM   warfarin  7.5 mg Oral ONCE-1600   Warfarin - Pharmacist Dosing Inpatient   Does not apply q1600   Continuous Infusions:  sodium chloride     PRN Meds:.albuterol, fentaNYL (SUBLIMAZE) injection, ondansetron **OR** ondansetron (ZOFRAN) IV, oxyCODONE Medications Prior to Admission:  Prior to Admission medications   Medication Sig Start Date End Date Taking? Authorizing Provider  albuterol (PROVENTIL) (2.5 MG/3ML) 0.083% nebulizer solution Take 2.5 mg by nebulization every 6 (six)  hours as needed for wheezing or shortness of breath.  08/29/19  Yes [provider]  ascorbic acid (VITAMIN C) 500 MG tablet Take 1,000 mg by mouth 2 (two) times daily.   Yes [provider]  carvedilol (COREG) 6.25 MG tablet Take 1 tablet (6.25 mg total) by mouth 2 (two) times daily with a meal. Patient taking differently: Take 6.25 mg by mouth 2 (two) times daily with a meal. Take 1/2 tablet bid 07/04/19  Yes Barton Dubois, MD  diphenhydramine-acetaminophen (TYLENOL PM) 25-500 MG TABS tablet Take 2 tablets by mouth at bedtime.    Yes [provider]  finasteride (PROSCAR) 5 MG tablet Take 5 mg by mouth daily. 04/20/19  Yes [provider]  furosemide (LASIX) 40 MG tablet Take 40 mg by mouth 2 (two) times daily.   Yes [provider]  HYDROcodone-acetaminophen (NORCO/VICODIN) 5-325 MG tablet Take 2 tablets by mouth every 6 (six) hours as needed for moderate pain or severe pain. 10/04/20 10/09/20 Yes [provider]  Astrid Drafts (OMEGA-3) 500 MG CAPS Take 1 capsule by mouth at bedtime.    Yes [provider]  Multiple Vitamins-Minerals (CERTAVITE/ANTIOXIDANTS) TABS Take 1 tablet by mouth daily.    Yes [provider]  potassium chloride (K-DUR) 10 MEQ tablet Take 10 mEq by mouth daily. Take with Lasix (Furosemide) 03/18/18  Yes [provider]  sacubitril-valsartan (ENTRESTO) 24-26 MG Take 1 tablet by mouth 2 (two) times daily. 07/04/19  Yes Barton Dubois, MD  simvastatin (ZOCOR) 40 MG tablet Take 40 mg by mouth at bedtime.    Yes [provider]  spironolactone (ALDACTONE) 25 MG tablet Take 0.5 tablets (12.5 mg total) by mouth daily. 11/10/19 10/05/20 Yes Strader, Fransisco Hertz, PA-C  tamsulosin (FLOMAX) 0.4 MG CAPS capsule Take 0.4 mg by mouth in the morning.  12/02/18  Yes [provider]  warfarin (COUMADIN) 5 MG tablet Take 1.5 tablets (7.5 mg total) by mouth daily at 6 PM. OR AS DIRECTED by MD. Patient taking  differently: Take 7.5 mg by mouth daily at 6 PM. Take 10 mg every night except Friday take 5 mg 09/07/15  Yes Hongalgi, Lenis Dickinson, MD  folic acid (FOLVITE) 948 MCG tablet Take 400 mcg by mouth daily.    [provider]   No Known Allergies Review of Systems  Unable to perform ROS: Age   Physical Exam Vitals and nursing note reviewed.  Constitutional:      General: He is not in acute distress.    Appearance: Normal appearance. He is not ill-appearing.  HENT:     Head: Normocephalic and atraumatic.     Mouth/Throat:     Mouth: Mucous membranes are moist.  Cardiovascular:     Rate and Rhythm: Normal rate.  Pulmonary:     Effort: Pulmonary effort is normal. No respiratory distress.  Skin:    General: Skin is warm and dry.  Neurological:     Mental Status: He is alert and oriented to person, place, and time.  Psychiatric:        Mood and Affect: Mood normal.        Behavior: Behavior normal.    Vital Signs: BP (!) 80/66   Pulse 93   Temp 98.1 F (36.7 C) (Oral)   Resp 18   Ht 5\' 8"  (1.727 m)   Wt 112.5 kg   SpO2 100%   BMI 37.71 kg/m  Pain Scale: 0-10   Pain Score: 5    SpO2: SpO2: 100 % O2 Device:SpO2: 100 % O2 Flow Rate: .   IO: Intake/output summary:  Intake/Output Summary (Last 24 hours) at 10/06/2020 1533 Last data filed at 10/06/2020 0900 Gross per 24 hour  Intake 240 ml  Output 350 ml  Net -110 ml    LBM: Last BM Date: 10/05/20 Baseline Weight: Weight: 112.5 kg Most recent weight: Weight: 112.5 kg     Palliative Assessment/Data:   Flowsheet Rows    Flowsheet Row Most Recent Value  Intake Tab   Referral Department Hospitalist  Unit at Time of Referral Intermediate Care Unit  Palliative Care Primary Diagnosis Trauma  Date Notified 10/06/20  Palliative Care Type New Palliative care  Reason for referral Clarify Goals of Care  Date of Admission 10/05/20  Date first seen by Palliative Care 10/06/20  # of days Palliative referral response time 0  Day(s)  # of days IP prior to Palliative referral 1  Clinical Assessment   Palliative Performance Scale Score 40%  Pain Max last 24 hours Not able to report  Pain Min Last 24 hours Not able to report  Dyspnea Max Last 24 Hours Not able to report  Dyspnea Min Last 24 hours Not able to report  Psychosocial & Spiritual Assessment   Palliative Care Outcomes        Time In: 1530 Time Out: 1600 Time Total: 30 minutes  Greater than 50%  of this time was spent counseling and coordinating care related to the above assessment and plan.  Signed by: Drue Novel, NP   Please contact Palliative Medicine Team phone at [phone] for questions and  concerns.  For individual provider: See Shea Evans

## 2020-10-06 NOTE — Evaluation (Signed)
Physical Therapy Evaluation Patient Details Name: Benjamin Cordova MRN: [id number] DOB: [birth year] Today's Date: 10/06/2020   History of Present Illness  86 y.o. male with medical history significant for long-term gait instability managed with a walker, CAD s/p CABG, HTN, OA, ischemic cardiomyopathy, afib on warfarin, hyperlipidemia who had been doing fairly well living alone at home.  Unfortunately during the July 4 celebration he slipped down and fell on his right side when the wheels slipped from his walker.  He has been having severe pain on the right side of his body since that time.  He reports that he went home but he has been unable to perform activities of daily living like cooking or dressing or bathing due to severe pain involving the right side of his body.  He also has severe shoulder pain on the right side.  He reports that he did not hit his head.  There was no syncope.  There is no chest pain or shortness of breath or seizure activity associated with his fall.  x-rays  positive for rib fxs  Clinical Impression  Pt worked on bed mobility with adominal bracing to decrease pain.  Pt has increased pain with all motions and is not safe to be at his home by himself at this time.  PT does not want to go to SNF at this time either.  Will continue to work with pt to see how mobility improves.     Follow Up Recommendations SNF;Home health PT    Equipment Recommendations  None recommended by PT    Recommendations for Other Services       Precautions / Restrictions Precautions Precaution Comments: Rt rib fxs Restrictions Weight Bearing Restrictions: No      Mobility  Bed Mobility Overal bed mobility: Needs Assistance Bed Mobility: Supine to Sit     Supine to sit: Mod assist          Transfers Overall transfer level: Needs assistance   Transfers: Sit to/from Stand Sit to Stand: Min assist            Ambulation/Gait Ambulation/Gait assistance: Min guard Gait  Distance (Feet): 15 Feet Assistive device:  (B platform from home) Gait Pattern/deviations: Step-through pattern;Decreased step length - left;Decreased step length - right   Gait velocity interpretation: <1.31 ft/sec, indicative of household ambulator                  Pertinent Vitals/Pain Pain Assessment: 0-10 Pain Score: 10-Worst pain ever Pain Location: Ribs with motion; lying still no pain Pain Descriptors / Indicators: Aching Pain Intervention(s): Limited activity within patient's tolerance    Home Living Family/patient expects to be discharged to:: Private residence Living Arrangements: Alone Available Help at Discharge: Available PRN/intermittently Type of Home: House Home Access: Stairs to enter Entrance Stairs-Rails: Left   Home Layout: One level Home Equipment: Environmental consultant - 4 wheels;Walker - 2 wheels;Grab bars - tub/shower;Shower seat - built in;Shower seat Additional Comments: Pt states that he does not have any family that can stay with him on a regular basis.    Prior Function Level of Independence: Independent with assistive device(s)         Comments:  (Pt had polio as a child and has Rt ankle weakness, walks with a limp.  Pt has a RW with B platform to bear weight on.  PT does not want to walk until he can get his own walker here; he has called his son who states that he will get the  walker here today.)     Hand Dominance   Dominant Hand: Right    Extremity/Trunk Assessment        Lower Extremity Assessment Lower Extremity Assessment: Overall WFL for tasks assessed       Communication   Communication: No difficulties  Cognition Arousal/Alertness: Awake/alert Behavior During Therapy: WFL for tasks assessed/performed Overall Cognitive Status: Within Functional Limits for tasks assessed                         Assessment/Plan    PT Assessment Patient needs continued PT services  PT Problem List Decreased activity tolerance;Decreased  balance;Pain;Decreased mobility       PT Treatment Interventions Functional mobility training;Balance training;Therapeutic exercise;Gait training    PT Goals (Current goals can be found in the Care Plan section)  Acute Rehab PT Goals Patient Stated Goal: To go home.  Pt would like to talk about hiring a CNA to come to his home to assist him vs. SNF PT Goal Formulation: With patient Time For Goal Achievement: 10/20/20 Potential to Achieve Goals: Good    Frequency Min 4X/week   Barriers to discharge        Co-evaluation               AM-PAC PT "6 Clicks" Mobility  Outcome Measure Help needed turning from your back to your side while in a flat bed without using bedrails?: A Lot Help needed moving from lying on your back to sitting on the side of a flat bed without using bedrails?: A Lot Help needed moving to and from a bed to a chair (including a wheelchair)?: A Little Help needed standing up from a chair using your arms (e.g., wheelchair or bedside chair)?: A Little Help needed to walk in hospital room?: A Little Help needed climbing 3-5 steps with a railing? : A Lot 6 Click Score: 15    End of Session Equipment Utilized During Treatment: Gait belt Activity Tolerance: Patient limited by pain Patient left: in chair;with call bell/phone within reach;with family/visitor present Nurse Communication: Mobility status PT Visit Diagnosis: Unsteadiness on feet (R26.81);History of falling (Z91.81);Pain Pain - part of body:  (ribs)    Time: 1324-4010 PT Time Calculation (min) (ACUTE ONLY): 44 min   Charges:   PT Evaluation $PT Eval Moderate Complexity: 1 Mod PT Treatments $Therapeutic Activity: 8-22 mins      Rayetta Humphrey, PT CLT [phone]  10/06/2020, 11:58 AM

## 2020-10-06 NOTE — Progress Notes (Signed)
ANTICOAGULATION CONSULT NOTE - Initial Consult  Pharmacy Consult for warfarin Indication: atrial fibrillation  No Known Allergies  Patient Measurements: Height: 5\' 8"  (172.7 cm) Weight: 112.5 kg (248 lb 0.3 oz) IBW/kg (Calculated) : 68.4 Heparin Dosing Weight:   Vital Signs: Temp: 98 F (36.7 C) (07/07 0530) Temp Source: Oral (07/07 0530) BP: 87/49 (07/07 0530) Pulse Rate: 70 (07/07 0530)  Labs: Recent Labs    10/05/20 1352 10/06/20 0607  HGB 13.3 12.3*  HCT 42.3 40.5  PLT 161 137*  LABPROT 23.0* 22.9*  INR 2.0* 2.0*  CREATININE 1.13  --      Estimated Creatinine Clearance: 60.3 mL/min (by C-G formula based on SCr of 1.13 mg/dL).   Medical History: Past Medical History:  Diagnosis Date   Arthritis    Coronary artery disease    a. s/p CABG in 1981 b. cath in 10/2014 showing occluded grafts --> redo CABG in 2016 LIMA-LAD and Left Radial-OM2   Essential hypertension    Hematuria    History of blood transfusion 07/2015   Knee replacement HgB <9   History of pneumonia    History of stroke    Ischemic cardiomyopathy    Left ventricular apical thrombus    Mixed hyperlipidemia    Myocardial infarction [facility]) 1980   Peripheral vascular disease ([place])    Polio osteopathy of lower leg ([place]) Age 56   Affected right leg   Skin cancer    TIA (transient ischemic attack) 1989   Chronic coumadin   UTI (lower urinary tract infection)     Medications:  Medications Prior to Admission  Medication Sig Dispense Refill Last Dose   albuterol (PROVENTIL) (2.5 MG/3ML) 0.083% nebulizer solution Take 2.5 mg by nebulization every 6 (six) hours as needed for wheezing or shortness of breath.       ascorbic acid (VITAMIN C) 500 MG tablet Take 1,000 mg by mouth 2 (two) times daily.   10/05/2020   carvedilol (COREG) 6.25 MG tablet Take 1 tablet (6.25 mg total) by mouth 2 (two) times daily with a meal. (Patient taking differently: Take 6.25 mg by mouth 2 (two) times daily with a meal. Take  1/2 tablet bid) 60 tablet 1 10/05/2020 at 01030   diphenhydramine-acetaminophen (TYLENOL PM) 25-500 MG TABS tablet Take 2 tablets by mouth at bedtime.    10/04/2020   finasteride (PROSCAR) 5 MG tablet Take 5 mg by mouth daily.   10/05/2020   furosemide (LASIX) 40 MG tablet Take 40 mg by mouth 2 (two) times daily.   10/05/2020   HYDROcodone-acetaminophen (NORCO/VICODIN) 5-325 MG tablet Take 2 tablets by mouth every 6 (six) hours as needed for moderate pain or severe pain.   10/05/2020   Krill Oil (OMEGA-3) 500 MG CAPS Take 1 capsule by mouth at bedtime.    10/05/2020   Multiple Vitamins-Minerals (CERTAVITE/ANTIOXIDANTS) TABS Take 1 tablet by mouth daily.    10/05/2020   potassium chloride (K-DUR) 10 MEQ tablet Take 10 mEq by mouth daily. Take with Lasix (Furosemide)   10/05/2020   sacubitril-valsartan (ENTRESTO) 24-26 MG Take 1 tablet by mouth 2 (two) times daily. 60 tablet 1 10/05/2020   simvastatin (ZOCOR) 40 MG tablet Take 40 mg by mouth at bedtime.    10/04/2020   spironolactone (ALDACTONE) 25 MG tablet Take 0.5 tablets (12.5 mg total) by mouth daily. 45 tablet 3 10/05/2020   tamsulosin (FLOMAX) 0.4 MG CAPS capsule Take 0.4 mg by mouth in the morning.    10/05/2020   warfarin (COUMADIN)  5 MG tablet Take 1.5 tablets (7.5 mg total) by mouth daily at 6 PM. OR AS DIRECTED by MD. (Patient taking differently: Take 7.5 mg by mouth daily at 6 PM. Take 10 mg every night except Friday take 5 mg) 60 tablet 0 1/0/2585 at 2778   folic acid (FOLVITE) 242 MCG tablet Take 400 mcg by mouth daily.       Assessment: Pharmacy consulted to dose warfarin in patient with atrial fibrillation. INR on admission is 2.0.  Home dose listed as 5 mg on Friday and 10 mg ROW. Last dose listed as 7/3 @ 2100.  He was recently seen by his physician for hematuria on 6/24.  CBC WNL  Goal of Therapy:  INR 2-3 Monitor platelets by anticoagulation protocol: Yes   Plan:  Warfarin 7.5 mg x 1 dose. Monitor daily INR and s/s of bleeding.  Thomasenia Sales, PharmD, MBA, BCGP Clinical Pharmacist  10/06/2020 1:35 PM

## 2020-10-06 NOTE — Progress Notes (Signed)
Rceived call from patient's assigned nurse, Caryl Asp, LPN regarding patient having decreased O2 sats and new shortness of breath. Patient having labored, shallow breathing on assessment, expiratory wheezing noted. O2 sats 68-70% on r/a. HR sustaining in the 120s. Stated "help me, I can't breathe, I can't breath." Rapid response called. Dr Wynetta Emery responded. EKG obtained, bolus started per new orders. BP 80/62 manually, patient pale. RT provided neb treatments as directed by MD. Patient transferred to ICU room 4 via bed accompanied by nursing staff. Bedside report given to Ruel Favors, RN by Caryl Asp, LPN.

## 2020-10-07 ENCOUNTER — Inpatient Hospital Stay (HOSPITAL_COMMUNITY): Payer: Medicare HMO

## 2020-10-07 DIAGNOSIS — S2241XD Multiple fractures of ribs, right side, subsequent encounter for fracture with routine healing: Secondary | ICD-10-CM

## 2020-10-07 DIAGNOSIS — R0609 Other forms of dyspnea: Secondary | ICD-10-CM

## 2020-10-07 DIAGNOSIS — E785 Hyperlipidemia, unspecified: Secondary | ICD-10-CM

## 2020-10-07 LAB — CBC WITH DIFFERENTIAL/PLATELET
Abs Immature Granulocytes: 0.07 10*3/uL (ref 0.00–0.07)
Basophils Absolute: 0 10*3/uL (ref 0.0–0.1)
Basophils Relative: 1 %
Eosinophils Absolute: 0.2 10*3/uL (ref 0.0–0.5)
Eosinophils Relative: 2 %
HCT: 36.4 % — ABNORMAL LOW (ref 39.0–52.0)
Hemoglobin: 11.8 g/dL — ABNORMAL LOW (ref 13.0–17.0)
Immature Granulocytes: 1 %
Lymphocytes Relative: 10 %
Lymphs Abs: 0.7 10*3/uL (ref 0.7–4.0)
MCH: 31.6 pg (ref 26.0–34.0)
MCHC: 32.4 g/dL (ref 30.0–36.0)
MCV: 97.3 fL (ref 80.0–100.0)
Monocytes Absolute: 0.5 10*3/uL (ref 0.1–1.0)
Monocytes Relative: 8 %
Neutro Abs: 5.4 10*3/uL (ref 1.7–7.7)
Neutrophils Relative %: 78 %
Platelets: 147 10*3/uL — ABNORMAL LOW (ref 150–400)
RBC: 3.74 MIL/uL — ABNORMAL LOW (ref 4.22–5.81)
RDW: 13.3 % (ref 11.5–15.5)
WBC: 6.8 10*3/uL (ref 4.0–10.5)
nRBC: 0 % (ref 0.0–0.2)

## 2020-10-07 LAB — ECHOCARDIOGRAM COMPLETE
AR max vel: 2.73 cm2
AV Area VTI: 3.1 cm2
AV Area mean vel: 2.79 cm2
AV Mean grad: 3 mmHg
AV Peak grad: 7.1 mmHg
Ao pk vel: 1.33 m/s
Area-P 1/2: 5.97 cm2
Height: 68 in
MV VTI: 3.57 cm2
S' Lateral: 5.8 cm
Weight: 3728.42 oz

## 2020-10-07 LAB — BASIC METABOLIC PANEL
Anion gap: 8 (ref 5–15)
BUN: 38 mg/dL — ABNORMAL HIGH (ref 8–23)
CO2: 27 mmol/L (ref 22–32)
Calcium: 8.6 mg/dL — ABNORMAL LOW (ref 8.9–10.3)
Chloride: 99 mmol/L (ref 98–111)
Creatinine, Ser: 1.24 mg/dL (ref 0.61–1.24)
GFR, Estimated: 58 mL/min — ABNORMAL LOW (ref >60)
Glucose, Bld: 104 mg/dL — ABNORMAL HIGH (ref 70–99)
Potassium: 4.3 mmol/L (ref 3.5–5.1)
Sodium: 134 mmol/L — ABNORMAL LOW (ref 135–145)

## 2020-10-07 LAB — BRAIN NATRIURETIC PEPTIDE: B Natriuretic Peptide: 113 pg/mL — ABNORMAL HIGH (ref 0.0–100.0)

## 2020-10-07 LAB — PROTIME-INR
INR: 2.1 — ABNORMAL HIGH (ref 0.8–1.2)
Prothrombin Time: 23.5 seconds — ABNORMAL HIGH (ref 11.4–15.2)

## 2020-10-07 MED ORDER — MIDODRINE HCL 5 MG PO TABS
10.0000 mg | ORAL_TABLET | Freq: Once | ORAL | Status: AC
  Administered 2020-10-07: 10 mg via ORAL
  Filled 2020-10-07: qty 2

## 2020-10-07 MED ORDER — FUROSEMIDE 10 MG/ML IJ SOLN
40.0000 mg | Freq: Two times a day (BID) | INTRAMUSCULAR | Status: DC
  Administered 2020-10-07: 40 mg via INTRAVENOUS
  Filled 2020-10-07: qty 4

## 2020-10-07 MED ORDER — FUROSEMIDE 40 MG PO TABS: 40.0000 mg | ORAL_TABLET | Freq: Two times a day (BID) | ORAL | Status: DC

## 2020-10-07 MED ORDER — WARFARIN SODIUM 7.5 MG PO TABS
7.5000 mg | ORAL_TABLET | Freq: Once | ORAL | Status: AC
  Administered 2020-10-07: 7.5 mg via ORAL
  Filled 2020-10-07: qty 1

## 2020-10-07 MED ORDER — SODIUM CHLORIDE 0.9 % IV BOLUS
500.0000 mL | Freq: Once | INTRAVENOUS | Status: AC
  Administered 2020-10-07: 500 mL via INTRAVENOUS

## 2020-10-07 NOTE — Progress Notes (Addendum)
[facility] night shift ICU coverage note.  The patient's blood pressure has been running soft during this shift.  However, his most recent measurement came back at 95/36 with a MAP of 54 mmHg.  He weights about 105 kg, but has a history of systolic heart failure with an echo done last year showing an EF of 25 to 30%.  A 500 mL NS bolus and 10 mg of midodrine p.o. have been ordered.  We will continue close blood pressure monitoring.  Tennis Must, MD.

## 2020-10-07 NOTE — Care Management Important Message (Signed)
Important Message  Patient Details  Name: Benjamin Cordova MRN: [id number] Date of Birth: [birth year]   Medicare Important Message Given:  Yes     [name] 10/07/2020, 2:55 PM

## 2020-10-07 NOTE — Progress Notes (Signed)
PT Cancellation Note  Patient Details Name: Benjamin Cordova MRN: [id number] DOB: 05/10/1934   Cancelled Treatment:    Reason Eval/Treat Not Completed: Medical issues which prohibited therapy.  Patient transferred to a higher level of care and will need new PT consult resume therapy when patient is medically stable.  Thank you.   7:33 AM, 10/07/20 Lonell Grandchild, MPT Physical Therapist with [facility] 336 [phone] office [phone] mobile phone

## 2020-10-07 NOTE — Progress Notes (Signed)
ANTICOAGULATION CONSULT NOTE -   Pharmacy Consult for warfarin Indication: atrial fibrillation  No Known Allergies  Patient Measurements: Height: 5\' 8"  (172.7 cm) Weight: 105.7 kg (233 lb 0.4 oz) IBW/kg (Calculated) : 68.4 Heparin Dosing Weight:   Vital Signs: Temp: 97.7 F (36.5 C) (07/08 0720) Temp Source: Oral (07/08 0720) BP: 95/56 (07/08 0731) Pulse Rate: 85 (07/08 0731)  Labs: Recent Labs    10/05/20 1352 10/06/20 0607 10/06/20 1859 10/06/20 2052 10/07/20 0524  HGB 13.3 12.3*  --   --  11.8*  HCT 42.3 40.5  --   --  36.4*  PLT 161 137*  --   --  147*  LABPROT 23.0* 22.9*  --   --  23.5*  INR 2.0* 2.0*  --   --  2.1*  CREATININE 1.13  --   --   --  1.24  TROPONINIHS  --   --  24* 27*  --      Estimated Creatinine Clearance: 53.2 mL/min (by C-G formula based on SCr of 1.24 mg/dL).   Medical History: Past Medical History:  Diagnosis Date   Arthritis    Coronary artery disease    a. s/p CABG in 1981 b. cath in 10/2014 showing occluded grafts --> redo CABG in 2016 LIMA-LAD and Left Radial-OM2   Essential hypertension    Hematuria    History of blood transfusion 07/2015   Knee replacement HgB <9   History of pneumonia    History of stroke    Ischemic cardiomyopathy    Left ventricular apical thrombus    Mixed hyperlipidemia    Myocardial infarction [facility]) 1980   Peripheral vascular disease ([place])    Polio osteopathy of lower leg ([place]) Age 86   Affected right leg   Skin cancer    TIA (transient ischemic attack) 1989   Chronic coumadin   UTI (lower urinary tract infection)     Medications:  Medications Prior to Admission  Medication Sig Dispense Refill Last Dose   albuterol (PROVENTIL) (2.5 MG/3ML) 0.083% nebulizer solution Take 2.5 mg by nebulization every 6 (six) hours as needed for wheezing or shortness of breath.       ascorbic acid (VITAMIN C) 500 MG tablet Take 1,000 mg by mouth 2 (two) times daily.   10/05/2020   carvedilol (COREG) 6.25 MG tablet  Take 1 tablet (6.25 mg total) by mouth 2 (two) times daily with a meal. (Patient taking differently: Take 6.25 mg by mouth 2 (two) times daily with a meal. Take 1/2 tablet bid) 60 tablet 1 10/05/2020 at 01030   diphenhydramine-acetaminophen (TYLENOL PM) 25-500 MG TABS tablet Take 2 tablets by mouth at bedtime.    10/04/2020   finasteride (PROSCAR) 5 MG tablet Take 5 mg by mouth daily.   10/05/2020   furosemide (LASIX) 40 MG tablet Take 40 mg by mouth 2 (two) times daily.   10/05/2020   HYDROcodone-acetaminophen (NORCO/VICODIN) 5-325 MG tablet Take 2 tablets by mouth every 6 (six) hours as needed for moderate pain or severe pain.   10/05/2020   Krill Oil (OMEGA-3) 500 MG CAPS Take 1 capsule by mouth at bedtime.    10/05/2020   Multiple Vitamins-Minerals (CERTAVITE/ANTIOXIDANTS) TABS Take 1 tablet by mouth daily.    10/05/2020   potassium chloride (K-DUR) 10 MEQ tablet Take 10 mEq by mouth daily. Take with Lasix (Furosemide)   10/05/2020   sacubitril-valsartan (ENTRESTO) 24-26 MG Take 1 tablet by mouth 2 (two) times daily. 60 tablet 1 10/05/2020  simvastatin (ZOCOR) 40 MG tablet Take 40 mg by mouth at bedtime.    10/04/2020   spironolactone (ALDACTONE) 25 MG tablet Take 0.5 tablets (12.5 mg total) by mouth daily. 45 tablet 3 10/05/2020   tamsulosin (FLOMAX) 0.4 MG CAPS capsule Take 0.4 mg by mouth in the morning.    10/05/2020   warfarin (COUMADIN) 5 MG tablet Take 1.5 tablets (7.5 mg total) by mouth daily at 6 PM. OR AS DIRECTED by MD. (Patient taking differently: Take 7.5 mg by mouth daily at 6 PM. Take 10 mg every night except Friday take 5 mg) 60 tablet 0 07/02/7406 at 1448   folic acid (FOLVITE) 185 MCG tablet Take 400 mcg by mouth daily.       Assessment: Pharmacy consulted to dose warfarin in patient with atrial fibrillation. INR on admission is 2.0.  Home dose listed as 5 mg on Friday and 10 mg ROW. Last dose listed as 7/3 @ 2100.  He was recently seen by his physician for hematuria on 6/24.  CBC WNL INR  2.1  Goal of Therapy:  INR 2-3 Monitor platelets by anticoagulation protocol: Yes   Plan:  Warfarin 7.5 mg x 1 dose. Monitor daily INR and s/s of bleeding.  Margot Ables, PharmD Clinical Pharmacist 10/07/2020 8:24 AM

## 2020-10-07 NOTE — Consult Note (Signed)
Cardiology Consultation:   Patient ID: Benjamin Cordova MRN: [id number]; DOB: 29-Jun-1934  Admit date: 10/05/2020 Date of Consult: 10/07/2020  PCP:  Celene Squibb, MD   [facility] Providers Cardiologist:  Rozann Lesches, MD        Patient Profile:   Benjamin Cordova is a 86 y.o. male with past medical history of CAD (s/p CABG in 1981, cath in 10/2014 showing occluded grafts --> redo CABG in 2016 LIMA-LAD and Left Radial-OM2), chronic combined systolic and diastolic CHF (EF 22-29% in 2016, 40-50% in 03/2018, at 30-35% in 03/2019, 25-30% by echo in 06/2019), history of LV apical thrombus (noted on echo in 06/2019 as well - on Coumadin), HTN, HLD and history of CVA who is being seen 10/07/2020 for the evaluation of hypotension at the request of Dr. Wynetta Emery.  History of Present Illness:   Mr. Scheuring was last examined by Dr. Domenic Polite in 04/2020 and reported doing well at that time and denied any recent anginal symptoms. BP had previously limited titration of his medical therapy and he was continued on his current doses of Coreg, Entresto, Spironolactone and Lasix. Was noted at that time he had previously declined ICD and further ischemic work-up.   He presented to [facility] ED on 10/05/2020 for evaluation of right shoulder pain after suffering a mechanical fall due to the wheels slipping on his walker while at a July 4th celebration.   Initial labs showed WBC 8.8, Hgb 13.3, platelets 161, Na+ 140, K+ 4.5 and creatinine 1.13. COVID negative. INR 2.0. Initial Hs Troponin 24 with repeat of 27. BNP 113. CXR showed acute right 6th and 7th rib fractures.   He was admitted for pain management and is receiving PRN Fentanyl, Toradol and Oxycodone. On 10/06/2020, he became hypotensive with BP at 80/60 and received IVF. Palliative Care was consulted for [place] discussions. He has continued to receive his PTA Coreg 3.125mg  BID, Lasix 40mg  BID, Entresto 24-26mg  BID and Spironolactone 12.5mg  daily.    The pt said he  felt oK be fore fall  No CP  Breathing OK  No edema   Past Medical History:  Diagnosis Date   Arthritis    Coronary artery disease    a. s/p CABG in 1981 b. cath in 10/2014 showing occluded grafts --> redo CABG in 2016 LIMA-LAD and Left Radial-OM2   Essential hypertension    Hematuria    History of blood transfusion 07/2015   Knee replacement HgB <9   History of pneumonia    History of stroke    Ischemic cardiomyopathy    Left ventricular apical thrombus    Mixed hyperlipidemia    Myocardial infarction [facility]) 1980   Peripheral vascular disease ([place])    Polio osteopathy of lower leg ([place]) Age 25   Affected right leg   Skin cancer    TIA (transient ischemic attack) 1989   Chronic coumadin   UTI (lower urinary tract infection)     Past Surgical History:  Procedure Laterality Date   CARDIAC CATHETERIZATION N/A 10/05/2014   Procedure: Left Heart Cath and Coronary Angiography;  Surgeon: Peter M Martinique, MD;  Location: [place] [facility];  Service: Cardiovascular;  Laterality: N/A;   CORONARY ARTERY BYPASS GRAFT  1981   [facility]   CORONARY ARTERY BYPASS GRAFT N/A 10/08/2014   Procedure: REDO CORONARY ARTERY BYPASS GRAFTING (CABG) Times Two Grafts using Left Internal Mammary and Left Radial Artery;  Surgeon: Melrose Nakayama, MD;  Location: [place];  Service:  Open Heart Surgery;  Laterality: N/A;   CYSTOSCOPY WITH INJECTION  10/10/2010   Procedure: CYSTOSCOPY WITH INJECTION;  Surgeon: Marissa Nestle;  Location: [facility];  Service: Urology;  Laterality: N/A;  with retrograde urethrogram   FOOT SURGERY Right 1954   [facility], muscle implantation   JOINT REPLACEMENT     RADIAL ARTERY HARVEST Left 10/08/2014   Procedure: Left RADIAL ARTERY HARVEST;  Surgeon: Melrose Nakayama, MD;  Location: [place];  Service: Open Heart Surgery;  Laterality: Left;   TEE WITHOUT CARDIOVERSION N/A 10/08/2014   Procedure: TRANSESOPHAGEAL ECHOCARDIOGRAM (TEE);  Surgeon: Melrose Nakayama, MD;  Location: [place];   Service: Open Heart Surgery;  Laterality: N/A;   TONSILLECTOMY  1949   [facility]   TOTAL KNEE ARTHROPLASTY Left 07/27/2015   Procedure: TOTAL LEFT KNEE ARTHROPLASTY;  Surgeon: [name], MD;  Location: [place];  Service: Orthopedics;  Laterality: Left;     Home Medications:  Prior to Admission medications   Medication Sig Start Date End Date Taking? Authorizing Provider  albuterol (PROVENTIL) (2.5 MG/3ML) 0.083% nebulizer solution Take 2.5 mg by nebulization every 6 (six) hours as needed for wheezing or shortness of breath.  08/29/19  Yes [provider]  ascorbic acid (VITAMIN C) 500 MG tablet Take 1,000 mg by mouth 2 (two) times daily.   Yes [provider]  carvedilol (COREG) 6.25 MG tablet Take 1 tablet (6.25 mg total) by mouth 2 (two) times daily with a meal. Patient taking differently: Take 6.25 mg by mouth 2 (two) times daily with a meal. Take 1/2 tablet bid 07/04/19  Yes Barton Dubois, MD  diphenhydramine-acetaminophen (TYLENOL PM) 25-500 MG TABS tablet Take 2 tablets by mouth at bedtime.    Yes [provider]  finasteride (PROSCAR) 5 MG tablet Take 5 mg by mouth daily. 04/20/19  Yes [provider]  furosemide (LASIX) 40 MG tablet Take 40 mg by mouth 2 (two) times daily.   Yes [provider]  HYDROcodone-acetaminophen (NORCO/VICODIN) 5-325 MG tablet Take 2 tablets by mouth every 6 (six) hours as needed for moderate pain or severe pain. 10/04/20 10/09/20 Yes [provider]  Astrid Drafts (OMEGA-3) 500 MG CAPS Take 1 capsule by mouth at bedtime.    Yes [provider]  Multiple Vitamins-Minerals (CERTAVITE/ANTIOXIDANTS) TABS Take 1 tablet by mouth daily.    Yes [provider]  potassium chloride (K-DUR) 10 MEQ tablet Take 10 mEq by mouth daily. Take with Lasix (Furosemide) 03/18/18  Yes [provider]  sacubitril-valsartan (ENTRESTO) 24-26 MG Take 1 tablet by mouth 2 (two) times daily. 07/04/19  Yes Barton Dubois, MD   simvastatin (ZOCOR) 40 MG tablet Take 40 mg by mouth at bedtime.    Yes [provider]  spironolactone (ALDACTONE) 25 MG tablet Take 0.5 tablets (12.5 mg total) by mouth daily. 11/10/19 10/05/20 Yes Strader, Fransisco Hertz, PA-C  tamsulosin (FLOMAX) 0.4 MG CAPS capsule Take 0.4 mg by mouth in the morning.  12/02/18  Yes [provider]  warfarin (COUMADIN) 5 MG tablet Take 1.5 tablets (7.5 mg total) by mouth daily at 6 PM. OR AS DIRECTED by MD. Patient taking differently: Take 7.5 mg by mouth daily at 6 PM. Take 10 mg every night except Friday take 5 mg 09/07/15  Yes Hongalgi, Lenis Dickinson, MD  folic acid (FOLVITE) 981 MCG tablet Take 400 mcg by mouth daily.    [provider]    Inpatient Medications: Scheduled Meds:  acetaminophen  650 mg  Oral Q6H   Or   acetaminophen  650 mg Rectal Q6H   ascorbic acid  1,000 mg Oral BID   carvedilol  3.125 mg Oral BID WC   Chlorhexidine Gluconate Cloth  6 each Topical Daily   diphenhydrAMINE  50 mg Oral QHS   docusate sodium  100 mg Oral QHS   finasteride  5 mg Oral Daily   folic acid  324 mcg Oral Daily   [START ON 10/08/2020] furosemide  40 mg Oral BID   levalbuterol  1.25 mg Nebulization BID   potassium chloride  10 mEq Oral Daily   sacubitril-valsartan  1 tablet Oral BID   simvastatin  40 mg Oral QHS   spironolactone  12.5 mg Oral Daily   tamsulosin  0.4 mg Oral q AM   warfarin  7.5 mg Oral ONCE-1600   Warfarin - Pharmacist Dosing Inpatient   Does not apply q1600   Continuous Infusions:  PRN Meds: albuterol, fentaNYL (SUBLIMAZE) injection, guaiFENesin-dextromethorphan, ketorolac, ondansetron **OR** ondansetron (ZOFRAN) IV, oxyCODONE  Allergies:   No Known Allergies  Social History:   Social History   Socioeconomic History   Marital status: Widowed    Spouse name: Not on file   Number of children: Not on file   Years of education: Not on file   Highest education level: Not on file  Occupational History   Not on file   Tobacco Use   Smoking status: Former    Packs/day: 1.00    Years: 28.00    Pack years: 28.00    Types: Cigarettes    Quit date: 09/22/1978    Years since quitting: 42.0   Smokeless tobacco: Never  Vaping Use   Vaping Use: Never used  Substance and Sexual Activity   Alcohol use: No    Alcohol/week: 0.0 standard drinks   Drug use: No   Sexual activity: Not on file  Other Topics Concern   Not on file  Social History Narrative   Not on file   Social Determinants of Health   Financial Resource Strain: Not on file  Food Insecurity: Not on file  Transportation Needs: Not on file  Physical Activity: Not on file  Stress: Not on file  Social Connections: Not on file  Intimate Partner Violence: Not on file    Family History:    Family History  Problem Relation Age of Onset   Heart attack Mother    Diabetes Mother    Heart attack Father    Heart attack Brother    Anesthesia problems Neg Hx    Hypotension Neg Hx    Malignant hyperthermia Neg Hx    Pseudochol deficiency Neg Hx      ROS:  Please see the history of present illness.   All other ROS reviewed and negative.     Physical Exam/Data:   Vitals:   10/07/20 1030 10/07/20 1045 10/07/20 1119 10/07/20 1200  BP: (!) 103/51   100/64  Pulse: 93 97 86 99  Resp: (!) 22 16 18    Temp:   97.6 F (36.4 C)   TempSrc:   Oral   SpO2: 92% 96% 93% 95%  Weight:      Height:        Intake/Output Summary (Last 24 hours) at 10/07/2020 1224 Last data filed at 10/07/2020 1000 Gross per 24 hour  Intake 1200 ml  Output 600 ml  Net 600 ml   Last 3 Weights 10/06/2020 10/05/2020 04/20/2020  Weight (lbs) 233 lb 0.4 oz  248 lb 0.3 oz 248 lb  Weight (kg) 105.7 kg 112.5 kg 112.492 kg     Body mass index is 35.43 kg/m.  General:  Well nourished, well developed, in no acute distress  Complains of back pain   HEENT: normal Lymph: no adenopathy Neck: neck is full   Endocrine:  No thryomegaly Vascular: No carotid bruits; FA pulses 2+  bilaterally without bruits  Cardiac:  normal S1, S2; RRR; no murmur  Lungs:  clear to auscultation bilaterally, no wheezing, rhonchi or rales  Abd: soft, nontender, no hepatomegaly  Ext: 1+ edema Musculoskeletal:  Deferred Skin: warm and dry  Neuro:  CNs 2-12 intact, no focal abnormalities noted Psych:  Normal affect   EKG:  The EKG was personally reviewed and demonstrates:  No new  Telemetry:  Telemetry was personally reviewed and demonstrates:  SR    Relevant CV Studies:  Echo  09/07/20  1. LVEF is severely depressed with severe hypokineis of the mid/distal ventricle; distal anterior, dsital inferior and apical akinesis. Note apex is not well seen in this bedside study COmpared to 2021 no significant change . Left ventricular ejection fraction, by estimation, is 20%%. The left ventricular internal cavity size was severely dilated. Left ventricular diastolic parameters are indeterminate. 2. Right ventricular systolic function is normal. The right ventricular size is normal. There is normal pulmonary artery systolic pressure. 3. Trivial mitral valve regurgitation. 4. The aortic valve is tricuspid. Aortic valve regurgitation is not visualized. Mild aortic valve sclerosis is present, with no evidence of aortic valve stenosis.  Limited Echocardiogram: 06/2019 IMPRESSIONS     1. Laminated mural thrombus seen in LV apex. Left ventricular ejection  fraction, by estimation, is 25 to 30%. The left ventricle has severely  decreased function. The left ventricle demonstrates global hypokinesis.  The left ventricular internal cavity  size was mildly dilated. Left ventricular diastolic parameters are  consistent with Grade I diastolic dysfunction (impaired relaxation).   2. The mitral valve is grossly normal. Trivial mitral valve  regurgitation.   3. The aortic valve is tricuspid. Aortic valve regurgitation is not  visualized. No aortic stenosis is present.   4. There is normal pulmonary  artery systolic pressure.   Laboratory Data:  High Sensitivity Troponin:   Recent Labs  Lab 10/06/20 1859 10/06/20 2052  TROPONINIHS 24* 27*     Chemistry Recent Labs  Lab 10/05/20 1352 10/07/20 0524  NA 140 134*  K 4.5 4.3  CL 102 99  CO2 31 27  GLUCOSE 117* 104*  BUN 31* 38*  CREATININE 1.13 1.24  CALCIUM 9.4 8.6*  GFRNONAA >60 58*  ANIONGAP 7 8    No results for input(s): PROT, ALBUMIN, AST, ALT, ALKPHOS, BILITOT in the last 168 hours. Hematology Recent Labs  Lab 10/05/20 1352 10/06/20 0607 10/07/20 0524  WBC 8.8 7.4 6.8  RBC 4.31 3.95* 3.74*  HGB 13.3 12.3* 11.8*  HCT 42.3 40.5 36.4*  MCV 98.1 102.5* 97.3  MCH 30.9 31.1 31.6  MCHC 31.4 30.4 32.4  RDW 13.2 13.3 13.3  PLT 161 137* 147*   BNP Recent Labs  Lab 10/07/20 0525  BNP 113.0*    DDimer No results for input(s): DDIMER in the last 168 hours.   Radiology/Studies:  DG Ribs Unilateral W/Chest Right  Result Date: 10/05/2020 CLINICAL DATA:  Right chest pain after a fall today. Initial encounter. EXAM: RIGHT RIBS AND CHEST - 3+ VIEW COMPARISON:  CT chest and single view of the chest 09/15/2019. FINDINGS: The patient  has acute fractures of right sixth and seventh ribs. The right sixth rib fracture is nondisplaced. There is mild superior displacement of the distal fragment of the seventh rib. Small left pleural effusion and chronic basilar atelectasis appear unchanged. Heart size is upper normal. The patient is status post CABG. No pneumothorax is identified. IMPRESSION: Acute right sixth and seventh rib fractures. Negative for pneumothorax. No change in a small left pleural effusion and basilar atelectasis. Electronically Signed   By: Inge Rise M.D.   On: 10/05/2020 14:14   DG Shoulder Right  Result Date: 10/05/2020 CLINICAL DATA:  Status post fall.  Right shoulder pain. EXAM: RIGHT SHOULDER - 2+ VIEW COMPARISON:  None. FINDINGS: There is no evidence of fracture or dislocation. Mild degenerative  changes noted at the acromioclavicular joint. There is an acute fracture involving the posterolateral aspect of the approximate right seventh rib. Soft tissues are unremarkable. IMPRESSION: Acute fracture involves the posterolateral aspect of the approximate right seventh rib. No signs of shoulder fracture. Electronically Signed   By: Kerby Moors M.D.   On: 10/05/2020 13:44   DG CHEST PORT 1 VIEW  Result Date: 10/07/2020 CLINICAL DATA:  86 year old male with history of dyspnea and multiple right-sided rib fractures. EXAM: PORTABLE CHEST 1 VIEW COMPARISON:  Chest x-ray 10/06/2020. FINDINGS: Low lung volumes. Chronic areas of pleuroparenchymal thickening and architectural distortion and chronic left pleural effusion, similar to prior examinations. Opacity at the left base and in the left mid lung periphery corresponds to areas of chronic rounded atelectasis on prior chest CT. Right lung is clear. No right pleural effusion. No pneumothorax. No evidence of pulmonary edema. Heart size is normal. Upper mediastinal contours are within normal limits. Aortic atherosclerosis. Status post median sternotomy. Minimally displaced fractures of the lateral aspects of the right sixth, seventh and eighth ribs again noted. IMPRESSION: 1. Acute minimally displaced fractures of the right sixth, seventh and eighth ribs. No right-sided pneumothorax. 2. Low lung volumes with chronic areas of rounded atelectasis in the left lung and chronic small left pleural effusion, similar to prior examinations. 3. Aortic atherosclerosis. Electronically Signed   By: Vinnie Langton M.D.   On: 10/07/2020 05:31   DG CHEST PORT 1 VIEW  Result Date: 10/06/2020 CLINICAL DATA:  Shortness of breath.  Known rib fractures. EXAM: PORTABLE CHEST 1 VIEW COMPARISON:  Chest x-ray 10/05/2020 FINDINGS: Chronic left pleural effusion and left lower lobe atelectasis with the appearance of rounded atelectasis on the prior chest CT. Chronic scarring changes  involving the right lung. No definite acute pulmonary findings. Stable right-sided rib fractures but no pneumothorax or pleural effusion. IMPRESSION: 1. Stable chronic left pleural effusion and left lower lobe atelectasis. 2. Stable right-sided rib fractures. Electronically Signed   By: Marijo Sanes M.D.   On: 10/06/2020 15:37     Assessment and Plan:   Acute on chronic systolic CHF   Pt with known LV dysfunction   Presents after fall   Now with some volume increase   Says he was feeling fine with no edema prior to his fall    Pt had been on carvedilol, Entresto, spironolactone prior to admti Now , BP is a little low probably due to pain meds   Would pull back a bit to allow for diuresis   Give IV lasix tonight and in AM    Add back Entresto, increase b blockade as bp allows    Hx of LV thrombus  Continue coumadin     2  CAD   No symptoms to sugg angina  Follw  3  HTN   Again, pull back with meds as noted  4  HL   Continue simvistatin    For questions or updates, please contact [facility] Please consult [email or web address] for contact info under    Signed, Erma Heritage, PA-C  10/07/2020 12:24 PM  Pt seen and examined   I have entered the exam portion and Assessment and plan above on my own.   Will continue to follow.  IF d/c home over weekend, msg so that f/u appt can be made.  Dorris Carnes MD

## 2020-10-07 NOTE — Progress Notes (Signed)
Palliative: [name] is lying quietly in bed.  He greets me making and mostly keeping eye contact.  He is alert and oriented, able to make his basic needs known.  There is no family at bedside at this time.  Although he has hearing loss, he is much better able to hear me today.  We talked about his acute health concerns, his fall with rib fracture, mobility issues, recommendations by PT.  [name] tells me that he does okay when he uses his walker.  He shares he does not fall frequently, and only fell this time because the ball came out of the wheel of his walker.  He tells me that he has been to short-term rehab in the past, and is agreeable to go this time.  Facility of choice would be [facility].  [name] continues to tell me that he would not want to be on life support, but does need to have this discussion with his family.  We talked about outpatient palliative services to continue goals of care/CODE STATUS discussions.  [name] is agreeable to outpatient palliative services.  Conference with attending, bedside nursing staff, transition of care team related to patient condition, needs, goals of care, disposition.  Plan: Continue to treat the treatable.  Outpatient palliative for continued goals of care/CODE STATUS discussions.  25 minutes Benjamin Axe, NP Palliative medicine team Team phone [phone] Greater than 50% of this time was spent counseling and coordinating care related to the above assessment and plan.

## 2020-10-07 NOTE — Progress Notes (Signed)
  Echocardiogram 2D Echocardiogram has been performed.  Benjamin Cordova 10/07/2020, 9:52 AM

## 2020-10-07 NOTE — Progress Notes (Signed)
Patient was adamant about refusing his ECHO. This RN spoke with patient about why it is important to have it done and what the ECHO shows. Informed patient that if he refuses these tests, then he will not get out of the hospital as quick as he'd like. Dr. Wynetta Emery made aware. For right now, patient is agreeable to ECHO. Patient did seem concerned about the cost and stated he did not have insurance. But did state he has medicare/medicaid. This RN assured patient not to worry about costs because he needs to get better.

## 2020-10-07 NOTE — Progress Notes (Signed)
PROGRESS NOTE    Benjamin Cordova  BTD:[id number] DOB: 09/30/1934 DOA: 10/05/2020 PCP: Celene Squibb, MD   Brief Narrative:    86 y.o. male with medical history significant for long-term gait instability managed with a walker, CAD s/p CABG, HTN, OA, ischemic cardiomyopathy, afib on warfarin, hyperlipidemia who had been doing fairly well living alone at home.  Unfortunately during the July 4 celebration he slipped down and fell on his right side when the wheels slipped from his walker.  He has been having severe pain on the right side of his body since that time.  He reports that he went home but he has been unable to perform activities of daily living like cooking or dressing or bathing due to severe pain involving the right side of his body.  He also has severe shoulder pain on the right side.  He reports that he did not hit his head.  There was no syncope.  There is no chest pain or shortness of breath or seizure activity associated with his fall.   During hospitalization, as patient was awaiting for acute rehab placement, patient developed shortness of breath, hypotension to 80/62 on 7/7 afternoon. Rapid response was called and Dr. Wynetta Emery assessed the patient. EKG showed sinus rhythm, HR 140s. Bolus NS 500 was administered, Xopenex neb treatment, placed on NRB and O2 sat improved to 100%. Port CXR showed no pleural effusion or acute changes. Troponin 20s-30s, likely demand ischemia. Transferred to SDU. Palliative care consulted and had [place] discussion with patient. Patient would like further discussion with his two sons. Patient had another episode of hypotension with MAP 54 later that evening and was given bolus 552ml NS, midodrine 10mg  with improvement in BP to 103/51 currently. Of note, Echo 06/2018 showed LVEF 25-30%. Echo repeated this admission given rapid response to evaluate for worsening of CHF; obtained 7/8 and pending results.   This morning, patient needed significant help sitting up and  eating breakfast. HDS and satting at 93% on RA.   Assessment & Plan:   Principal Problem:   Multiple rib fractures Active Problems:   Coronary artery disease   Hypertension   Arthritis   Peripheral vascular disease (HCC)   Paroxysmal atrial fibrillation (HCC)   S/P CABG x 2   S/P total knee replacement   CHF (congestive heart failure) (HCC)   Weakness   Uncontrolled pain   Hypotension  Multiple Rib Fractures-patient had a recent fall at home and now with significant pain and discomfort not able to perform ADLs.  Admitted for pain management.  Rib binder ordered.  Ambulate with PT.  Multimodal pain regimen as followed:  - Schedule tylenol 650 mg PO every 6 hours - Oxycodone 5mg  PO q4h PRN for breakthrough pain - Fentanyl 12.39mcg q2h PRN severe pain only. - Laxatives ordered - Fall precautions ordered  Fall at home-reportedly slipped as the wheels came off the walker.  PT evaluated patient, recommends SNF.  Consult to [facility] team for skilled nursing rehabilitation placement.   Goals of care - given advanced comorbidities and high mortality risk we have requested an inpatient palliative consultation.   Hypotension - patient developed hypotension to 80s/60s on 7/7.  Received bolus 1L NS, midodrine 10mg  with improvement in BP to 100s/50s. Likely secondary to volume down from diuresis and decreased PO intake. Transferred to SDU. Will continue to monitor closely - HOLD Lasix 7/8. Schedule to resume tomorrow if BP improves  CAD s/p CABG/ischemic cardiomyopathy - Echo 06/2018 showed LVEF 25-30%.  -  Echo 7/8, pending results - Cardiology consult - HOLD Lasix - Continue entresto, zocor, spironolactone  Chronic atrial fibrillation-resume home carvedilol 3.125 mg twice daily for rate control and warfarin for full anticoagulation (dosing per pharmacy).   Acquired thrombophilia-patient is on warfarin for full anticoagulation consulted Pharm.D. for assistance with managing warfarin in the  hospital.  Follow daily PT/INR testing.  BPH - stable on flomax and proscar  DVT prophylaxis: warfarin  Code Status: Full   Family Communication: bedside update   Disposition Plan: anticipating SNF placement   Consults called: PT   Admission status: INP     The patient remains INP appropriate .   Dispo: The patient is from: Home              Anticipated d/c is to:  SNF              Patient currently is not medically stable to d/c.               Difficult to place patient No     Consultants:  PT  Cardiology   Procedures:  Echocardiogram 7/8, pending results   Antimicrobials:  N/a    Subjective: Pt was having difficulty sitting up by himself to eat breakfast and needed assistance from MD and medical student. Complaint of pain but breathing is better. No chest pain and no SOB symptoms.  He is hungry and says he will eat all of his plate today.   Objective: Vitals:   10/07/20 1015 10/07/20 1030 10/07/20 1045 10/07/20 1119  BP:  (!) 103/51    Pulse: 91 93 97 86  Resp: 19 (!) 22 16 18   Temp:    97.6 F (36.4 C)  TempSrc:    Oral  SpO2: 95% 92% 96% 93%  Weight:      Height:        Intake/Output Summary (Last 24 hours) at 10/07/2020 1139 Last data filed at 10/07/2020 1000 Gross per 24 hour  Intake 1200 ml  Output 600 ml  Net 600 ml   Filed Weights   10/05/20 1141 10/06/20 1532  Weight: 112.5 kg 105.7 kg    Examination:  General exam: he appears chronically ill, awake, appropriate,Appears in no acute distress Respiratory system: Clear to auscultation. Respiratory effort normal. Cardiovascular system: S1 & S2 heard, RRR. No JVD, murmurs, rubs, gallops or clicks. No pedal edema. Gastrointestinal system: Abdomen is nondistended, soft and nontender. No organomegaly or masses felt. Normal bowel sounds heard. Central nervous system: Alert and oriented. No focal neurological deficits. Can't self-ambulate due to pain Extremities: Symmetric 5 x 5 power. Skin: No rashes,  lesions or ulcers Psychiatry: Mood & affect appropriate.   Data Reviewed: I have personally reviewed following labs and imaging studies  CBC: Recent Labs  Lab 10/05/20 1352 10/06/20 0607 10/07/20 0524  WBC 8.8 7.4 6.8  NEUTROABS 7.2  --  5.4  HGB 13.3 12.3* 11.8*  HCT 42.3 40.5 36.4*  MCV 98.1 102.5* 97.3  PLT 161 137* 330*   Basic Metabolic Panel: Recent Labs  Lab 10/05/20 1352 10/07/20 0524  NA 140 134*  K 4.5 4.3  CL 102 99  CO2 31 27  GLUCOSE 117* 104*  BUN 31* 38*  CREATININE 1.13 1.24  CALCIUM 9.4 8.6*   GFR: Estimated Creatinine Clearance: 53.2 mL/min (by C-G formula based on SCr of 1.24 mg/dL). Liver Function Tests: No results for input(s): AST, ALT, ALKPHOS, BILITOT, PROT, ALBUMIN in the last 168 hours. No results for input(s):  LIPASE, AMYLASE in the last 168 hours. No results for input(s): AMMONIA in the last 168 hours. Coagulation Profile: Recent Labs  Lab 10/05/20 1352 10/06/20 0607 10/07/20 0524  INR 2.0* 2.0* 2.1*   Cardiac Enzymes: No results for input(s): CKTOTAL, CKMB, CKMBINDEX, TROPONINI in the last 168 hours. BNP (last 3 results) No results for input(s): PROBNP in the last 8760 hours. HbA1C: No results for input(s): HGBA1C in the last 72 hours. CBG: No results for input(s): GLUCAP in the last 168 hours. Lipid Profile: No results for input(s): CHOL, HDL, LDLCALC, TRIG, CHOLHDL, LDLDIRECT in the last 72 hours. Thyroid Function Tests: No results for input(s): TSH, T4TOTAL, FREET4, T3FREE, THYROIDAB in the last 72 hours. Anemia Panel: No results for input(s): VITAMINB12, FOLATE, FERRITIN, TIBC, IRON, RETICCTPCT in the last 72 hours. Urine analysis:    Component Value Date/Time   COLORURINE YELLOW 07/01/2019 0445   APPEARANCEUR CLOUDY (A) 07/01/2019 0445   LABSPEC 1.027 07/01/2019 0445   PHURINE 5.0 07/01/2019 0445   GLUCOSEU NEGATIVE 07/01/2019 0445   HGBUR SMALL (A) 07/01/2019 0445   BILIRUBINUR NEGATIVE 07/01/2019 0445    KETONESUR NEGATIVE 07/01/2019 0445   PROTEINUR NEGATIVE 07/01/2019 0445   UROBILINOGEN 1.0 10/07/2014 1638   NITRITE NEGATIVE 07/01/2019 0445   LEUKOCYTESUR LARGE (A) 07/01/2019 0445    Recent Results (from the past 240 hour(s))  SARS CORONAVIRUS 2 (TAT 6-24 HRS) Nasopharyngeal Nasopharyngeal Swab     Status: None   Collection Time: 10/05/20  3:36 PM   Specimen: Nasopharyngeal Swab  Result Value Ref Range Status   SARS Coronavirus 2 NEGATIVE NEGATIVE Final    Comment: (NOTE) SARS-CoV-2 target nucleic acids are NOT DETECTED.  The SARS-CoV-2 RNA is generally detectable in upper and lower respiratory specimens during the acute phase of infection. Negative results do not preclude SARS-CoV-2 infection, do not rule out co-infections with other pathogens, and should not be used as the sole basis for treatment or other patient management decisions. Negative results must be combined with clinical observations, patient history, and epidemiological information. The expected result is Negative.  Fact Sheet for Patients: SugarRoll.be  Fact Sheet for Healthcare Providers: https://www.woods-mathews.com/  This test is not yet approved or cleared by the [place] FDA and  has been authorized for detection and/or diagnosis of SARS-CoV-2 by FDA under an Emergency Use Authorization (EUA). This EUA will remain  in effect (meaning this test can be used) for the duration of the COVID-19 declaration under Se ction 564(b)(1) of the Act, 21 U.S.C. section 360bbb-3(b)(1), unless the authorization is terminated or revoked sooner.  Performed at [facility], [place] [address], [place], [place] [address]    Radiology Studies: DG Ribs Unilateral W/Chest Right  Result Date: 10/05/2020 CLINICAL DATA:  Right chest pain after a fall today. Initial encounter. EXAM: RIGHT RIBS AND CHEST - 3+ VIEW COMPARISON:  CT chest and single view of the chest 09/15/2019.  FINDINGS: The patient has acute fractures of right sixth and seventh ribs. The right sixth rib fracture is nondisplaced. There is mild superior displacement of the distal fragment of the seventh rib. Small left pleural effusion and chronic basilar atelectasis appear unchanged. Heart size is upper normal. The patient is status post CABG. No pneumothorax is identified. IMPRESSION: Acute right sixth and seventh rib fractures. Negative for pneumothorax. No change in a small left pleural effusion and basilar atelectasis. Electronically Signed   By: Inge Rise M.D.   On: 10/05/2020 14:14   DG Shoulder Right  Result  Date: 10/05/2020 CLINICAL DATA:  Status post fall.  Right shoulder pain. EXAM: RIGHT SHOULDER - 2+ VIEW COMPARISON:  None. FINDINGS: There is no evidence of fracture or dislocation. Mild degenerative changes noted at the acromioclavicular joint. There is an acute fracture involving the posterolateral aspect of the approximate right seventh rib. Soft tissues are unremarkable. IMPRESSION: Acute fracture involves the posterolateral aspect of the approximate right seventh rib. No signs of shoulder fracture. Electronically Signed   By: Kerby Moors M.D.   On: 10/05/2020 13:44   DG CHEST PORT 1 VIEW  Result Date: 10/07/2020 CLINICAL DATA:  86 year old male with history of dyspnea and multiple right-sided rib fractures. EXAM: PORTABLE CHEST 1 VIEW COMPARISON:  Chest x-ray 10/06/2020. FINDINGS: Low lung volumes. Chronic areas of pleuroparenchymal thickening and architectural distortion and chronic left pleural effusion, similar to prior examinations. Opacity at the left base and in the left mid lung periphery corresponds to areas of chronic rounded atelectasis on prior chest CT. Right lung is clear. No right pleural effusion. No pneumothorax. No evidence of pulmonary edema. Heart size is normal. Upper mediastinal contours are within normal limits. Aortic atherosclerosis. Status post median sternotomy.  Minimally displaced fractures of the lateral aspects of the right sixth, seventh and eighth ribs again noted. IMPRESSION: 1. Acute minimally displaced fractures of the right sixth, seventh and eighth ribs. No right-sided pneumothorax. 2. Low lung volumes with chronic areas of rounded atelectasis in the left lung and chronic small left pleural effusion, similar to prior examinations. 3. Aortic atherosclerosis. Electronically Signed   By: Vinnie Langton M.D.   On: 10/07/2020 05:31   DG CHEST PORT 1 VIEW  Result Date: 10/06/2020 CLINICAL DATA:  Shortness of breath.  Known rib fractures. EXAM: PORTABLE CHEST 1 VIEW COMPARISON:  Chest x-ray 10/05/2020 FINDINGS: Chronic left pleural effusion and left lower lobe atelectasis with the appearance of rounded atelectasis on the prior chest CT. Chronic scarring changes involving the right lung. No definite acute pulmonary findings. Stable right-sided rib fractures but no pneumothorax or pleural effusion. IMPRESSION: 1. Stable chronic left pleural effusion and left lower lobe atelectasis. 2. Stable right-sided rib fractures. Electronically Signed   By: Marijo Sanes M.D.   On: 10/06/2020 15:37    Scheduled Meds:  acetaminophen  650 mg Oral Q6H   Or   acetaminophen  650 mg Rectal Q6H   acetaminophen  1,000 mg Oral QHS   ascorbic acid  1,000 mg Oral BID   carvedilol  3.125 mg Oral BID WC   Chlorhexidine Gluconate Cloth  6 each Topical Daily   diphenhydrAMINE  50 mg Oral QHS   docusate sodium  100 mg Oral QHS   finasteride  5 mg Oral Daily   folic acid  295 mcg Oral Daily   [START ON 10/08/2020] furosemide  40 mg Oral BID   levalbuterol  1.25 mg Nebulization BID   potassium chloride  10 mEq Oral Daily   sacubitril-valsartan  1 tablet Oral BID   simvastatin  40 mg Oral QHS   spironolactone  12.5 mg Oral Daily   tamsulosin  0.4 mg Oral q AM   warfarin  7.5 mg Oral ONCE-1600   Warfarin - Pharmacist Dosing Inpatient   Does not apply q1600   Continuous  Infusions:   LOS: 1 day    Time spent: 37 minutes  Fredrik Rigger, Medical student [facility] Pager [phone]  If 7PM-7AM, please contact night-coverage [email or web address] Password [email or web address] 10/07/2020, 11:39 AM  _________________________________________________________________ ATTENDING NOTE  Patient seen and examined with Fredrik Rigger, Medical student. In addition to supervising the encounter, I played a key role in the decision making process as well as reviewed key findings.   Pt is doing better today, remains hypotensive with soft BPs but much improved from yesterday, holding his home lasix temporarily.  He responded well to IV fluid bolus and 1 dose of midodrine.  Ambulate with PT if able.  Working on SNF placement.    Murvin Natal, MD Attending Physician How to contact the [facility] Attending or Consulting provider [place] or covering provider during after hours [place], for this patient?  Check the care team in [facility] and look for a) attending/consulting [facility] provider listed and b) the [facility] team listed Log into [email or web address] and use [place]'s universal password to access. If you do not have the password, please contact the hospital operator. Locate the [facility] provider you are looking for under [facility] and page to a number that you can be directly reached. If you still have difficulty reaching the provider, please page the [facility] (Director on Call) for the Hospitalists listed on amion for assistance.

## 2020-10-07 NOTE — Progress Notes (Signed)
TRH night shift stepdown coverage note.  The nursing staff reported that the patient's blood pressure was 83/52 mmHg.  He received furosemide and oxycodone earlier.  He is not having any symptomatology from it.  Last night he had a similar instance and received 500 mL of NS bolus and 10 mg of midodrine.  I will defer IVF bolus and order midodrine 10 mg p.o. x1 dose.  Tennis Must, MD.

## 2020-10-07 NOTE — Progress Notes (Addendum)
Physical Therapy Treatment Patient Details Name: Benjamin Cordova MRN: [id number] DOB: [birth year] Today's Date: 10/07/2020    History of Present Illness 86 y.o. male with medical history significant for long-term gait instability managed with a walker, CAD s/p CABG, HTN, OA, ischemic cardiomyopathy, afib on warfarin, hyperlipidemia who had been doing fairly well living alone at home.  Unfortunately during the July 4 celebration he slipped down and fell on his right side when the wheels slipped from his walker.  He has been having severe pain on the right side of his body since that time.  He reports that he went home but he has been unable to perform activities of daily living like cooking or dressing or bathing due to severe pain involving the right side of his body.  He also has severe shoulder pain on the right side.  He reports that he did not hit his head.  There was no syncope.  There is no chest pain or shortness of breath or seizure activity associated with his fall.  x-rays  positive for rib fxs    PT Comments    REASSESSMENT: Patient presents in bed asleep and easily aroused for therapy. Patient demonstrated fair bed mobility with min assist limited primarily due to pain. Patient was able to maintain seated balance at the EOB for 5 minutes during therapeutic activity. Patient did not report and increase in pain with activity and was able to transfer to his bilateral platform walker with min assist and able to ambulate for 15 without loss of balance. Patient was able to trasnfer to the chair to conclude session - nursing notified. Patient will benefit from continued physical therapy in hospital and recommended venue below to increase strength, balance, endurance for safe ADLs and gait.    Follow Up Recommendations  SNF;Home health PT     Equipment Recommendations  None recommended by PT    Recommendations for Other Services       Precautions / Restrictions Precautions Precautions:  Fall Precaution Comments: Rt rib fxs Restrictions Weight Bearing Restrictions: No    Mobility  Bed Mobility Overal bed mobility: Needs Assistance Bed Mobility: Supine to Sit     Supine to sit: Mod assist;Min assist     General bed mobility comments: slow labored movement, increased in pain with WB on RUE Patient Response: Cooperative  Transfers Overall transfer level: Needs assistance Equipment used: Bilateral platform walker Transfers: Sit to/from Stand Sit to Stand: Min assist;Mod assist         General transfer comment: uses bilateral platfrom walker, slow labored movement  Ambulation/Gait Ambulation/Gait assistance: Mod assist Gait Distance (Feet): 15 Feet Assistive device: Bilateral platform walker Gait Pattern/deviations: Step-through pattern;Decreased step length - left;Decreased step length - right;Decreased stride length;Narrow base of support;Trunk flexed Gait velocity: decreased   General Gait Details: slow labored movement, increase time   Stairs             Wheelchair Mobility    Modified Rankin (Stroke Patients Only)       Balance Overall balance assessment: Needs assistance Sitting-balance support: Feet supported;No upper extremity supported Sitting balance-Leahy Scale: Fair Sitting balance - Comments: at EOB   Standing balance support: During functional activity;Bilateral upper extremity supported Standing balance-Leahy Scale: Fair Standing balance comment: using bilateral platform walker                            Cognition Arousal/Alertness: Awake/alert Behavior During Therapy: WFL for tasks  assessed/performed Overall Cognitive Status: Within Functional Limits for tasks assessed                                        Exercises General Exercises - Lower Extremity Long Arc Quad: Seated;AROM;Strengthening;10 reps;Both Toe Raises: Seated;AROM;Strengthening;Both;10 reps    General Comments         Pertinent Vitals/Pain Pain Assessment: Faces Faces Pain Scale: Hurts even more Pain Location: Ribs with motion; lying still no pain Pain Descriptors / Indicators: Aching Pain Intervention(s): Limited activity within patient's tolerance;Monitored during session;Repositioned    Home Living                      Prior Function            PT Goals (current goals can now be found in the care plan section) Acute Rehab PT Goals Patient Stated Goal: To go home.  Pt would like to talk about hiring a CNA to come to his home to assist him vs. SNF PT Goal Formulation: With patient Time For Goal Achievement: 10/20/20 Potential to Achieve Goals: Good Progress towards PT goals: Progressing toward goals    Frequency    Min 3X/week      PT Plan Current plan remains appropriate    Co-evaluation              AM-PAC PT "6 Clicks" Mobility   Outcome Measure  Help needed turning from your back to your side while in a flat bed without using bedrails?: A Little Help needed moving from lying on your back to sitting on the side of a flat bed without using bedrails?: A Little Help needed moving to and from a bed to a chair (including a wheelchair)?: A Lot Help needed standing up from a chair using your arms (e.g., wheelchair or bedside chair)?: A Lot Help needed to walk in hospital room?: A Lot Help needed climbing 3-5 steps with a railing? : Total 6 Click Score: 13    End of Session   Activity Tolerance: Patient limited by pain Patient left: in chair;with call bell/phone within reach;with family/visitor present Nurse Communication: Mobility status PT Visit Diagnosis: Unsteadiness on feet (R26.81);History of falling (Z91.81);Difficulty in walking, not elsewhere classified (R26.2) Pain - part of body:  (R flank)     Time: 1517-6160 PT Time Calculation (min) (ACUTE ONLY): 20 min  Charges:  $Therapeutic Activity: 23-37 mins                     2:14 PM, 10/07/20 Haide Klinker  Cousler SPT  2:14 PM, 10/07/20 Lonell Grandchild, MPT Physical Therapist with [facility] [phone] office [phone] mobile phone

## 2020-10-08 LAB — CBC WITH DIFFERENTIAL/PLATELET
Abs Immature Granulocytes: 0.07 10*3/uL (ref 0.00–0.07)
Basophils Absolute: 0.1 10*3/uL (ref 0.0–0.1)
Basophils Relative: 1 %
Eosinophils Absolute: 0.2 10*3/uL (ref 0.0–0.5)
Eosinophils Relative: 3 %
HCT: 33.8 % — ABNORMAL LOW (ref 39.0–52.0)
Hemoglobin: 10.9 g/dL — ABNORMAL LOW (ref 13.0–17.0)
Immature Granulocytes: 1 %
Lymphocytes Relative: 12 %
Lymphs Abs: 0.7 10*3/uL (ref 0.7–4.0)
MCH: 30.9 pg (ref 26.0–34.0)
MCHC: 32.2 g/dL (ref 30.0–36.0)
MCV: 95.8 fL (ref 80.0–100.0)
Monocytes Absolute: 0.6 10*3/uL (ref 0.1–1.0)
Monocytes Relative: 10 %
Neutro Abs: 4.3 10*3/uL (ref 1.7–7.7)
Neutrophils Relative %: 73 %
Platelets: 148 10*3/uL — ABNORMAL LOW (ref 150–400)
RBC: 3.53 MIL/uL — ABNORMAL LOW (ref 4.22–5.81)
RDW: 13.3 % (ref 11.5–15.5)
WBC: 5.9 10*3/uL (ref 4.0–10.5)
nRBC: 0 % (ref 0.0–0.2)

## 2020-10-08 LAB — BASIC METABOLIC PANEL
Anion gap: 6 (ref 5–15)
BUN: 40 mg/dL — ABNORMAL HIGH (ref 8–23)
CO2: 28 mmol/L (ref 22–32)
Calcium: 8.2 mg/dL — ABNORMAL LOW (ref 8.9–10.3)
Chloride: 99 mmol/L (ref 98–111)
Creatinine, Ser: 1.08 mg/dL (ref 0.61–1.24)
GFR, Estimated: >60 mL/min (ref >60)
Glucose, Bld: 88 mg/dL (ref 70–99)
Potassium: 4.5 mmol/L (ref 3.5–5.1)
Sodium: 133 mmol/L — ABNORMAL LOW (ref 135–145)

## 2020-10-08 LAB — HEPATIC FUNCTION PANEL
ALT: 20 U/L (ref 0–44)
AST: 19 U/L (ref 15–41)
Albumin: 3.3 g/dL — ABNORMAL LOW (ref 3.5–5.0)
Alkaline Phosphatase: 40 U/L (ref 38–126)
Bilirubin, Direct: 0.1 mg/dL (ref 0.0–0.2)
Indirect Bilirubin: 0.2 mg/dL — ABNORMAL LOW (ref 0.3–0.9)
Total Bilirubin: 0.3 mg/dL (ref 0.3–1.2)
Total Protein: 5.9 g/dL — ABNORMAL LOW (ref 6.5–8.1)

## 2020-10-08 LAB — PROTIME-INR
INR: 2.2 — ABNORMAL HIGH (ref 0.8–1.2)
Prothrombin Time: 24.1 seconds — ABNORMAL HIGH (ref 11.4–15.2)

## 2020-10-08 LAB — BRAIN NATRIURETIC PEPTIDE: B Natriuretic Peptide: 86 pg/mL (ref 0.0–100.0)

## 2020-10-08 LAB — MRSA NEXT GEN BY PCR, NASAL: MRSA by PCR Next Gen: NOT DETECTED

## 2020-10-08 MED ORDER — LEVALBUTEROL HCL 1.25 MG/0.5ML IN NEBU: 1.2500 mg | INHALATION_SOLUTION | Freq: Four times a day (QID) | RESPIRATORY_TRACT | Status: DC | PRN

## 2020-10-08 MED ORDER — MIDODRINE HCL 5 MG PO TABS
5.0000 mg | ORAL_TABLET | Freq: Three times a day (TID) | ORAL | Status: DC
  Administered 2020-10-08 – 2020-10-09 (×4): 5 mg via ORAL
  Filled 2020-10-08 (×5): qty 1

## 2020-10-08 MED ORDER — SACUBITRIL-VALSARTAN 24-26 MG PO TABS
1.0000 | ORAL_TABLET | Freq: Two times a day (BID) | ORAL | Status: DC
  Administered 2020-10-08 – 2020-10-09 (×3): 1 via ORAL
  Filled 2020-10-08 (×4): qty 1

## 2020-10-08 MED ORDER — OXYCODONE HCL 5 MG PO TABS
5.0000 mg | ORAL_TABLET | Freq: Four times a day (QID) | ORAL | Status: DC | PRN
  Administered 2020-10-08 – 2020-10-11 (×11): 5 mg via ORAL
  Filled 2020-10-08 (×11): qty 1

## 2020-10-08 MED ORDER — WARFARIN SODIUM 7.5 MG PO TABS
7.5000 mg | ORAL_TABLET | Freq: Once | ORAL | Status: AC
  Administered 2020-10-08: 7.5 mg via ORAL
  Filled 2020-10-08: qty 1

## 2020-10-08 MED ORDER — KETOROLAC TROMETHAMINE 15 MG/ML IJ SOLN: 15.0000 mg | Freq: Three times a day (TID) | INTRAMUSCULAR | Status: DC | PRN

## 2020-10-08 MED ORDER — FUROSEMIDE 40 MG PO TABS
40.0000 mg | ORAL_TABLET | Freq: Every day | ORAL | Status: DC
  Administered 2020-10-09 – 2020-10-11 (×3): 40 mg via ORAL
  Filled 2020-10-08 (×3): qty 1

## 2020-10-08 MED ORDER — SENNOSIDES-DOCUSATE SODIUM 8.6-50 MG PO TABS
2.0000 | ORAL_TABLET | Freq: Every day | ORAL | Status: DC
  Administered 2020-10-08 – 2020-10-10 (×3): 2 via ORAL
  Filled 2020-10-08 (×3): qty 2

## 2020-10-08 NOTE — TOC Progression Note (Signed)
Transition of Care [facility]) - Progression Note    Patient Details  Name: Benjamin Cordova MRN: [id number] Date of Birth: [birth year]  [facility]) CM/SW Contact  Natasha Bence, LCSW Phone Number: 10/08/2020, 5:25 PM  Clinical Narrative:    CSW faxed out to [facility] SN[facility]. Facility to start [place]. TOC to follow.    Expected Discharge Plan: Skilled Nursing Facility Barriers to Discharge: Continued Medical Work up  Expected Discharge Plan and Services Expected Discharge Plan: [place]                                               Social Determinants of Health (SDOH) Interventions    Readmission Risk Interventions Readmission Risk Prevention Plan 07/04/2019  Transportation Screening Complete  PCP or Specialist Appt within 3-5 Days Not Complete  HRI or Home Care Consult Complete  Social Work Consult for [place] Planning/Counseling Complete  Palliative Care Screening Not Applicable  Medication Review Press photographer) Complete  Some recent data might be hidden

## 2020-10-08 NOTE — Progress Notes (Signed)
ANTICOAGULATION CONSULT NOTE -   Pharmacy Consult for warfarin Indication: atrial fibrillation  No Known Allergies  Patient Measurements: Height: 5\' 8"  (172.7 cm) Weight: 105.7 kg (233 lb 0.4 oz) IBW/kg (Calculated) : 68.4 Heparin Dosing Weight:   Vital Signs: Temp: 97.8 F (36.6 C) (07/09 0400) Temp Source: Oral (07/09 0400) BP: 102/54 (07/09 0600) Pulse Rate: 89 (07/09 0600)  Labs: Recent Labs    10/05/20 1352 10/06/20 0607 10/06/20 1859 10/06/20 2052 10/07/20 0524 10/08/20 0543  HGB 13.3 12.3*  --   --  11.8* 10.9*  HCT 42.3 40.5  --   --  36.4* 33.8*  PLT 161 137*  --   --  147* 148*  LABPROT 23.0* 22.9*  --   --  23.5* 24.1*  INR 2.0* 2.0*  --   --  2.1* 2.2*  CREATININE 1.13  --   --   --  1.24 1.08  TROPONINIHS  --   --  24* 27*  --   --      Estimated Creatinine Clearance: 61.1 mL/min (by C-G formula based on SCr of 1.08 mg/dL).   Medical History: Past Medical History:  Diagnosis Date   Arthritis    Coronary artery disease    a. s/p CABG in 1981 b. cath in 10/2014 showing occluded grafts --> redo CABG in 2016 LIMA-LAD and Left Radial-OM2   Essential hypertension    Hematuria    History of blood transfusion 07/2015   Knee replacement HgB <9   History of pneumonia    History of stroke    Ischemic cardiomyopathy    Left ventricular apical thrombus    Mixed hyperlipidemia    Myocardial infarction [facility]) 1980   Peripheral vascular disease ([place])    Polio osteopathy of lower leg ([place]) Age 86   Affected right leg   Skin cancer    TIA (transient ischemic attack) 1989   Chronic coumadin   UTI (lower urinary tract infection)     Medications:  Medications Prior to Admission  Medication Sig Dispense Refill Last Dose   albuterol (PROVENTIL) (2.5 MG/3ML) 0.083% nebulizer solution Take 2.5 mg by nebulization every 6 (six) hours as needed for wheezing or shortness of breath.       ascorbic acid (VITAMIN C) 500 MG tablet Take 1,000 mg by mouth 2 (two) times  daily.   10/05/2020   carvedilol (COREG) 6.25 MG tablet Take 1 tablet (6.25 mg total) by mouth 2 (two) times daily with a meal. (Patient taking differently: Take 6.25 mg by mouth 2 (two) times daily with a meal. Take 1/2 tablet bid) 60 tablet 1 10/05/2020 at 01030   diphenhydramine-acetaminophen (TYLENOL PM) 25-500 MG TABS tablet Take 2 tablets by mouth at bedtime.    10/04/2020   finasteride (PROSCAR) 5 MG tablet Take 5 mg by mouth daily.   10/05/2020   furosemide (LASIX) 40 MG tablet Take 40 mg by mouth 2 (two) times daily.   10/05/2020   HYDROcodone-acetaminophen (NORCO/VICODIN) 5-325 MG tablet Take 2 tablets by mouth every 6 (six) hours as needed for moderate pain or severe pain.   10/05/2020   Krill Oil (OMEGA-3) 500 MG CAPS Take 1 capsule by mouth at bedtime.    10/05/2020   Multiple Vitamins-Minerals (CERTAVITE/ANTIOXIDANTS) TABS Take 1 tablet by mouth daily.    10/05/2020   potassium chloride (K-DUR) 10 MEQ tablet Take 10 mEq by mouth daily. Take with Lasix (Furosemide)   10/05/2020   sacubitril-valsartan (ENTRESTO) 24-26 MG Take 1 tablet by  mouth 2 (two) times daily. 60 tablet 1 10/05/2020   simvastatin (ZOCOR) 40 MG tablet Take 40 mg by mouth at bedtime.    10/04/2020   spironolactone (ALDACTONE) 25 MG tablet Take 0.5 tablets (12.5 mg total) by mouth daily. 45 tablet 3 10/05/2020   tamsulosin (FLOMAX) 0.4 MG CAPS capsule Take 0.4 mg by mouth in the morning.    10/05/2020   warfarin (COUMADIN) 5 MG tablet Take 1.5 tablets (7.5 mg total) by mouth daily at 6 PM. OR AS DIRECTED by MD. (Patient taking differently: Take 7.5 mg by mouth daily at 6 PM. Take 10 mg every night except Friday take 5 mg) 60 tablet 0 12/07/6732 at 1937   folic acid (FOLVITE) 902 MCG tablet Take 400 mcg by mouth daily.       Assessment: Pharmacy consulted to dose warfarin in patient with atrial fibrillation. INR on admission is 2.0.  Home dose listed as 5 mg on Friday and 10 mg ROW. Last dose listed as 7/3 @ 2100.  He was recently seen by his  physician for hematuria on 6/24.  CBC WNL INR 2.2  Goal of Therapy:  INR 2-3 Monitor platelets by anticoagulation protocol: Yes   Plan:  Warfarin 7.5 mg x 1 dose. Monitor daily INR and s/s of bleeding.  Margot Ables, PharmD Clinical Pharmacist 10/08/2020 8:12 AM

## 2020-10-08 NOTE — Progress Notes (Signed)
PROGRESS NOTE    Benjamin Cordova  AVW:[id number] DOB: 1934-06-12 DOA: 10/05/2020 PCP: Celene Squibb, MD   Brief Narrative:    86 y.o. male with medical history significant for long-term gait instability managed with a walker, CAD s/p CABG, HTN, OA, ischemic cardiomyopathy, afib on warfarin, hyperlipidemia who had been doing fairly well living alone at home.  Unfortunately during the July 4 celebration he slipped down and fell on his right side when the wheels slipped from his walker.  He has been having severe pain on the right side of his body since that time.  He reports that he went home but he has been unable to perform activities of daily living like cooking or dressing or bathing due to severe pain involving the right side of his body.  He also has severe shoulder pain on the right side.  He reports that he did not hit his head.  There was no syncope.  There is no chest pain or shortness of breath or seizure activity associated with his fall.   During hospitalization, as patient was awaiting for acute rehab placement, patient developed shortness of breath, hypotension to 80/62 on 7/7 afternoon. Rapid response was called and Dr. Wynetta Emery assessed the patient. EKG showed sinus rhythm, HR 140s. Bolus NS 500 was administered, Xopenex neb treatment, placed on NRB and O2 sat improved to 100%. Port CXR showed no pleural effusion or acute changes. Troponin 20s-30s, likely demand ischemia. Transferred to SDU. Palliative care consulted and had [place] discussion with patient. Patient would like further discussion with his two sons. Patient had another episode of hypotension with MAP 54 later that evening and was given bolus 525ml NS, midodrine 10mg  with improvement in BP to 103/51 currently. Of note, Echo 06/2018 showed LVEF 25-30%. Echo repeated this admission given rapid response to evaluate for worsening of CHF; obtained 7/8 and pending results.   This morning, patient needed significant help sitting up and  eating breakfast. HDS and satting at 93% on RA.   Assessment & Plan:   Principal Problem:   Multiple rib fractures Active Problems:   Coronary artery disease   Hypertension   Arthritis   Peripheral vascular disease (HCC)   Paroxysmal atrial fibrillation (HCC)   S/P CABG x 2   S/P total knee replacement   CHF (congestive heart failure) (HCC)   Weakness   Uncontrolled pain   Hypotension  Multiple Rib Fractures-patient had a recent fall at home and now with significant pain and discomfort not able to perform ADLs.  Admitted for pain management.  Rib binder ordered.  Ambulate with PT.  Multimodal pain regimen as followed:  - Schedule tylenol 650 mg PO every 6 hours - Oxycodone 5mg  PO q4h PRN for breakthrough pain - Fentanyl 12.18mcg q2h PRN severe pain only. - Laxatives ordered - Fall precautions ordered  Fall at home-reportedly slipped as the wheels came off the walker.  PT evaluated patient, recommends SNF.  Consult to [facility] team for skilled nursing rehabilitation placement.   Goals of care - given advanced comorbidities and high mortality risk we have requested an inpatient palliative consultation. Pt agreeable to rehab placement and outpatient palliative care for goals of care.   Hypotension - patient developed hypotension to 80s/60s on 7/7.  Received bolus 1L NS, midodrine 10mg  with improvement in BP to 100s/50s. Likely secondary to volume down from diuresis and decreased PO intake. Transferred to SDU. Will continue to monitor closely - IV lasix given on 7/8.  Resume oral  lasix 40 mg daily if BP tolerates 7/9. Add midodrine 5 mg TID.  BP has been too low to titrate coreg further.  CAD s/p CABG/ischemic cardiomyopathy - Echo 7/22 with findings of severe cardiomyopathy LVEF 20%  - Echo 7/8, pending results - Cardiology consult - HOLD Lasix - restart entresto, zocor, spironolactone  Chronic atrial fibrillation-resumed home carvedilol 3.125 mg twice daily for rate control and  warfarin for full anticoagulation (dosing per pharmacy).     Acquired thrombophilia-patient is on warfarin for full anticoagulation consulted Pharm.D. for assistance with managing warfarin in the hospital.  Follow daily PT/INR testing. INR has been therapeutic.   BPH - stable on flomax and proscar  DVT prophylaxis: warfarin  Code Status: Full   Family Communication: bedside update   Disposition Plan: anticipating SNF placement   Consults called: PT   Admission status: INP     The patient remains INP appropriate .   Dispo: The patient is from: Home              Anticipated d/c is to:  SNF              Patient currently is not medically stable to d/c.               Difficult to place patient No   Consultants:  PT  Cardiology   Procedures:  Echocardiogram 7/8   1. LVEF is severely depressed with severe hypokineis of the mid/distal ventricle; distal anterior, dsital inferior and apical akinesis. Note apex is not well seen in this bedside study COmpared to 2021 no significant change . Left ventricular ejection fraction, by estimation, is 20%%. The left ventricular internal cavity  size was severely dilated. Left ventricular diastolic parameters are indeterminate.   2. Right ventricular systolic function is normal. The right ventricular size is normal. There is normal pulmonary artery systolic pressure.   3. Trivial mitral valve regurgitation.   4. The aortic valve is tricuspid. Aortic valve regurgitation is not visualized. Mild aortic valve sclerosis is present, with no evidence of aortic valve stenosis.   Antimicrobials:  N/a    Subjective: Pt says he is feeling a lot better, he says that his appetite has been good, no CP, no SOB.    Objective: Vitals:   10/08/20 1200 10/08/20 1300 10/08/20 1312 10/08/20 1350  BP: (!) 99/51 (!) 135/114 (!) 94/51   Pulse: 84 (!) 102 90 89  Resp: (!) 21 (!) 21 17 17   Temp:      TempSrc:      SpO2: 94% 95% 95% 92%  Weight:      Height:         Intake/Output Summary (Last 24 hours) at 10/08/2020 1603 Last data filed at 10/07/2020 2332 Gross per 24 hour  Intake 220 ml  Output 620 ml  Net -400 ml   Filed Weights   10/05/20 1141 10/06/20 1532  Weight: 112.5 kg 105.7 kg   Examination:  General exam: he appears chronically ill, awake, appropriate,Appears in no acute distress Respiratory system: Clear to auscultation. Respiratory effort normal. Cardiovascular system: S1 & S2 heard, RRR. No JVD, murmurs, rubs, gallops or clicks. No pedal edema. Gastrointestinal system: Abdomen is nondistended, soft and nontender. No organomegaly or masses felt. Normal bowel sounds heard. Central nervous system: Alert and oriented. No focal neurological deficits. Can't self-ambulate due to pain Extremities: Symmetric 5 x 5 power. Skin: No rashes, lesions or ulcers Psychiatry: Mood & affect appropriate.   Data Reviewed: I have  personally reviewed following labs and imaging studies  CBC: Recent Labs  Lab 10/05/20 1352 10/06/20 0607 10/07/20 0524 10/08/20 0543  WBC 8.8 7.4 6.8 5.9  NEUTROABS 7.2  --  5.4 4.3  HGB 13.3 12.3* 11.8* 10.9*  HCT 42.3 40.5 36.4* 33.8*  MCV 98.1 102.5* 97.3 95.8  PLT 161 137* 147* 782*   Basic Metabolic Panel: Recent Labs  Lab 10/05/20 1352 10/07/20 0524 10/08/20 0543  NA 140 134* 133*  K 4.5 4.3 4.5  CL 102 99 99  CO2 31 27 28   GLUCOSE 117* 104* 88  BUN 31* 38* 40*  CREATININE 1.13 1.24 1.08  CALCIUM 9.4 8.6* 8.2*   GFR: Estimated Creatinine Clearance: 61.1 mL/min (by C-G formula based on SCr of 1.08 mg/dL). Liver Function Tests: Recent Labs  Lab 10/08/20 0543  AST 19  ALT 20  ALKPHOS 40  BILITOT 0.3  PROT 5.9*  ALBUMIN 3.3*   No results for input(s): LIPASE, AMYLASE in the last 168 hours. No results for input(s): AMMONIA in the last 168 hours. Coagulation Profile: Recent Labs  Lab 10/05/20 1352 10/06/20 0607 10/07/20 0524 10/08/20 0543  INR 2.0* 2.0* 2.1* 2.2*   Cardiac  Enzymes: No results for input(s): CKTOTAL, CKMB, CKMBINDEX, TROPONINI in the last 168 hours. BNP (last 3 results) No results for input(s): PROBNP in the last 8760 hours. HbA1C: No results for input(s): HGBA1C in the last 72 hours. CBG: No results for input(s): GLUCAP in the last 168 hours. Lipid Profile: No results for input(s): CHOL, HDL, LDLCALC, TRIG, CHOLHDL, LDLDIRECT in the last 72 hours. Thyroid Function Tests: No results for input(s): TSH, T4TOTAL, FREET4, T3FREE, THYROIDAB in the last 72 hours. Anemia Panel: No results for input(s): VITAMINB12, FOLATE, FERRITIN, TIBC, IRON, RETICCTPCT in the last 72 hours. Urine analysis:    Component Value Date/Time   COLORURINE YELLOW 07/01/2019 0445   APPEARANCEUR CLOUDY (A) 07/01/2019 0445   LABSPEC 1.027 07/01/2019 0445   PHURINE 5.0 07/01/2019 0445   GLUCOSEU NEGATIVE 07/01/2019 0445   HGBUR SMALL (A) 07/01/2019 0445   BILIRUBINUR NEGATIVE 07/01/2019 0445   KETONESUR NEGATIVE 07/01/2019 0445   PROTEINUR NEGATIVE 07/01/2019 0445   UROBILINOGEN 1.0 10/07/2014 1638   NITRITE NEGATIVE 07/01/2019 0445   LEUKOCYTESUR LARGE (A) 07/01/2019 0445    Recent Results (from the past 240 hour(s))  SARS CORONAVIRUS 2 (TAT 6-24 HRS) Nasopharyngeal Nasopharyngeal Swab     Status: None   Collection Time: 10/05/20  3:36 PM   Specimen: Nasopharyngeal Swab  Result Value Ref Range Status   SARS Coronavirus 2 NEGATIVE NEGATIVE Final    Comment: (NOTE) SARS-CoV-2 target nucleic acids are NOT DETECTED.  The SARS-CoV-2 RNA is generally detectable in upper and lower respiratory specimens during the acute phase of infection. Negative results do not preclude SARS-CoV-2 infection, do not rule out co-infections with other pathogens, and should not be used as the sole basis for treatment or other patient management decisions. Negative results must be combined with clinical observations, patient history, and epidemiological information. The  expected result is Negative.  Fact Sheet for Patients: SugarRoll.be  Fact Sheet for Healthcare Providers: https://www.woods-mathews.com/  This test is not yet approved or cleared by the [place] FDA and  has been authorized for detection and/or diagnosis of SARS-CoV-2 by FDA under an Emergency Use Authorization (EUA). This EUA will remain  in effect (meaning this test can be used) for the duration of the COVID-19 declaration under Se ction 564(b)(1) of the Act, 21 U.S.C. section 360bbb-3(b)(1),  unless the authorization is terminated or revoked sooner.  Performed at [facility], [place] [address], [place], [place] [address]    Radiology Studies: DG CHEST PORT 1 VIEW  Result Date: 10/07/2020 CLINICAL DATA:  86 year old male with history of dyspnea and multiple right-sided rib fractures. EXAM: PORTABLE CHEST 1 VIEW COMPARISON:  Chest x-ray 10/06/2020. FINDINGS: Low lung volumes. Chronic areas of pleuroparenchymal thickening and architectural distortion and chronic left pleural effusion, similar to prior examinations. Opacity at the left base and in the left mid lung periphery corresponds to areas of chronic rounded atelectasis on prior chest CT. Right lung is clear. No right pleural effusion. No pneumothorax. No evidence of pulmonary edema. Heart size is normal. Upper mediastinal contours are within normal limits. Aortic atherosclerosis. Status post median sternotomy. Minimally displaced fractures of the lateral aspects of the right sixth, seventh and eighth ribs again noted. IMPRESSION: 1. Acute minimally displaced fractures of the right sixth, seventh and eighth ribs. No right-sided pneumothorax. 2. Low lung volumes with chronic areas of rounded atelectasis in the left lung and chronic small left pleural effusion, similar to prior examinations. 3. Aortic atherosclerosis. Electronically Signed   By: Vinnie Langton M.D.   On: 10/07/2020 05:31    ECHOCARDIOGRAM COMPLETE  Result Date: 10/07/2020    ECHOCARDIOGRAM REPORT   Patient Name:   DONTRAY HABERLAND Date of Exam: 10/07/2020 Medical Rec #:  [id number]       Height:       68.0 in Accession #:    [id number]      Weight:       233.0 lb Date of Birth:  08/06/1934       BSA:          2.181 m Patient Age:    34 years        BP:           95/56 mmHg Patient Gender: M               HR:           85 bpm. Exam Location:  [facility] Procedure: 2D Echo, Cardiac Doppler and Color Doppler Indications:    Dyspnea  History:        Patient has prior history of Echocardiogram examinations, most                 recent 07/01/2019. CHF, CAD, Prior CABG, Arrythmias:Atrial                 Fibrillation, Signs/Symptoms:Shortness of Breath; Risk                 Factors:Hypertension and Former Smoker.  Sonographer:    Wenda Low Referring Phys: [place] Comments: Patient refused the use of Definity. IMPRESSIONS  1. LVEF is severely depressed with severe hypokineis of the mid/distal ventricle; distal anterior, dsital inferior and apical akinesis. Note apex is not well seen in this bedside study COmpared to 2021 no significant change . Left ventricular ejection fraction, by estimation, is 20%%. The left ventricular internal cavity size was severely dilated. Left ventricular diastolic parameters are indeterminate.  2. Right ventricular systolic function is normal. The right ventricular size is normal. There is normal pulmonary artery systolic pressure.  3. Trivial mitral valve regurgitation.  4. The aortic valve is tricuspid. Aortic valve regurgitation is not visualized. Mild aortic valve sclerosis is present, with no evidence of aortic valve stenosis. FINDINGS  Left Ventricle: LVEF is severely  depressed with severe hypokineis of the mid/distal ventricle; distal anterior, dsital inferior and apical akinesis. Note apex is not well seen in this bedside study COmpared to 2021 no significant change. Left  ventricular ejection fraction, by estimation, is 20%%. The left ventricular internal cavity size was severely dilated. There is no left ventricular hypertrophy. Left ventricular diastolic parameters are indeterminate. Right Ventricle: The right ventricular size is normal. Right vetricular wall thickness was not assessed. Right ventricular systolic function is normal. There is normal pulmonary artery systolic pressure. The tricuspid regurgitant velocity is 1.72 m/s, and with an assumed right atrial pressure of 3 mmHg, the estimated right ventricular systolic pressure is 25.3 mmHg. Left Atrium: Left atrial size was normal in size. Right Atrium: Right atrial size was normal in size. Pericardium: Trivial pericardial effusion is present. Mitral Valve: There is mild thickening of the mitral valve leaflet(s). Trivial mitral valve regurgitation. MV peak gradient, 5.6 mmHg. The mean mitral valve gradient is 2.0 mmHg. Tricuspid Valve: The tricuspid valve is normal in structure. Tricuspid valve regurgitation is trivial. Aortic Valve: The aortic valve is tricuspid. Aortic valve regurgitation is not visualized. Mild aortic valve sclerosis is present, with no evidence of aortic valve stenosis. Aortic valve mean gradient measures 3.0 mmHg. Aortic valve peak gradient measures 7.1 mmHg. Aortic valve area, by VTI measures 3.10 cm. Pulmonic Valve: The pulmonic valve was normal in structure. Pulmonic valve regurgitation is not visualized. Aorta: The aortic root is normal in size and structure. IAS/Shunts: No atrial level shunt detected by color flow Doppler.  LEFT VENTRICLE PLAX 2D LVIDd:         6.31 cm  Diastology LVIDs:         5.80 cm  LV e' medial:    12.60 cm/s LV PW:         1.04 cm  LV E/e' medial:  7.5 LV IVS:        1.13 cm  LV e' lateral:   13.20 cm/s LVOT diam:     2.10 cm  LV E/e' lateral: 7.2 LV SV:         69 LV SV Index:   32 LVOT Area:     3.46 cm  RIGHT VENTRICLE RV Basal diam:  3.62 cm TAPSE (M-mode): 2.4 cm LEFT  ATRIUM             Index       RIGHT ATRIUM           Index LA diam:        4.50 cm 2.06 cm/m  RA Area:     15.40 cm LA Vol (A2C):   66.7 ml 30.58 ml/m RA Volume:   34.80 ml  15.96 ml/m LA Vol (A4C):   65.5 ml 30.03 ml/m LA Biplane Vol: 67.7 ml 31.04 ml/m  AORTIC VALVE AV Area (Vmax):    2.73 cm AV Area (Vmean):   2.79 cm AV Area (VTI):     3.10 cm AV Vmax:           133.00 cm/s AV Vmean:          78.800 cm/s AV VTI:            0.222 m AV Peak Grad:      7.1 mmHg AV Mean Grad:      3.0 mmHg LVOT Vmax:         105.00 cm/s LVOT Vmean:        63.500 cm/s LVOT VTI:  0.199 m LVOT/AV VTI ratio: 0.90  AORTA Ao Root diam: 3.40 cm Ao Asc diam:  3.10 cm MITRAL VALVE               TRICUSPID VALVE MV Area (PHT): 5.97 cm    TR Peak grad:   11.8 mmHg MV Area VTI:   3.57 cm    TR Vmax:        172.00 cm/s MV Peak grad:  5.6 mmHg MV Mean grad:  2.0 mmHg    SHUNTS MV Vmax:       1.18 m/s    Systemic VTI:  0.20 m MV Vmean:      60.6 cm/s   Systemic Diam: 2.10 cm MV Decel Time: 127 msec MV E velocity: 94.60 cm/s Dorris Carnes MD Electronically signed by Dorris Carnes MD Signature Date/Time: 10/07/2020/4:22:38 PM    Final     Scheduled Meds:  acetaminophen  650 mg Oral Q6H   Or   acetaminophen  650 mg Rectal Q6H   ascorbic acid  1,000 mg Oral BID   carvedilol  3.125 mg Oral BID WC   Chlorhexidine Gluconate Cloth  6 each Topical Daily   diphenhydrAMINE  50 mg Oral QHS   docusate sodium  100 mg Oral QHS   finasteride  5 mg Oral Daily   folic acid  620 mcg Oral Daily   [START ON 10/09/2020] furosemide  40 mg Oral Daily   midodrine  5 mg Oral TID WC   potassium chloride  10 mEq Oral Daily   sacubitril-valsartan  1 tablet Oral BID   simvastatin  40 mg Oral QHS   spironolactone  12.5 mg Oral Daily   tamsulosin  0.4 mg Oral q AM   warfarin  7.5 mg Oral ONCE-1600   Warfarin - Pharmacist Dosing Inpatient   Does not apply q1600   Continuous Infusions:   LOS: 2 days   Time spent: 35 minutes  Jennell Janosik Wynetta Emery,  MD [facility] How to contact the [facility] Attending or Consulting provider 7A - 7P or covering provider during after hours 7P -7A, for this patient?  Check the care team in [facility] and look for a) attending/consulting [facility] provider listed and b) the [facility] team listed Log into [email or web address] and use [facility]'s universal password to access. If you do not have the password, please contact the hospital operator. Locate the [facility] provider you are looking for under [facility] and page to a number that you can be directly reached. If you still have difficulty reaching the provider, please page the [facility] (Director on Call) for the Hospitalists listed on amion for assistance.

## 2020-10-08 NOTE — TOC Initial Note (Signed)
Transition of Care [facility]) - Initial/Assessment Note    Patient Details  Name: Benjamin Cordova MRN: [id number] Date of Birth: [birth year]  Transition of Care [facility]) CM/SW Contact:    Natasha Bence, LCSW Phone Number: 10/08/2020, 5:20 PM  Clinical Narrative:                 Patient is an 86 year old man admitted for Multiple rib fractures. Patient agreeable to SNF referrals for RC. FL2 started. TOC to follow.  Expected Discharge Plan: Skilled Nursing Facility Barriers to Discharge: Continued Medical Work up   Patient Goals and CMS Choice Patient states their goals for this hospitalization and ongoing recovery are:: Rehab with SNF CMS Medicare.gov Compare Post Acute Care list provided to:: Patient Choice offered to / list presented to : Patient  Expected Discharge Plan and Services Expected Discharge Plan: [place]                                              Prior Living Arrangements/Services   Lives with:: Self Patient language and need for interpreter reviewed:: Yes Do you feel safe going back to the place where you live?: Yes      Need for Family Participation in Patient Care: Yes (Comment) Care giver support system in place?: Yes (comment)   Criminal Activity/Legal Involvement Pertinent to Current Situation/Hospitalization: No - Comment as needed  Activities of Daily Living Home Assistive Devices/Equipment: Shower chair without back ADL Screening (condition at time of admission) Patient's cognitive ability adequate to safely complete daily activities?: Yes Is the patient deaf or have difficulty hearing?: Yes Does the patient have difficulty seeing, even when wearing glasses/contacts?: Yes Does the patient have difficulty concentrating, remembering, or making decisions?: No Patient able to express need for assistance with ADLs?: Yes Does the patient have difficulty dressing or bathing?: No Independently performs ADLs?: Yes (appropriate for  developmental age) Does the patient have difficulty walking or climbing stairs?: Yes Weakness of Legs: Both Weakness of Arms/Hands: Both  Permission Sought/Granted Permission sought to share information with : Family Supports Permission granted to share information with : Yes, Verbal Permission Granted     Permission granted to share info w AGENCY: Local SNF        Emotional Assessment     Affect (typically observed): Accepting, Adaptable, Appropriate Orientation: : Oriented to Self, Oriented to Situation, Oriented to Place Alcohol / Substance Use: Not Applicable Psych Involvement: No (comment)  Admission diagnosis:  Multiple rib fractures [S22.49XA] Closed fracture of multiple ribs of right side, initial encounter [S22.41XA] Hypotension [I95.9] Patient Active Problem List   Diagnosis Date Noted   Hypotension 10/06/2020   Multiple rib fractures 10/05/2020   Uncontrolled pain 10/05/2020   AKI (acute kidney injury) ([place])    Weakness 06/30/2019   Shortness of breath 06/30/2019   Subtherapeutic anticoagulation    Acute on chronic respiratory failure with hypoxia and hypercapnia (HCC)    Acute on chronic systolic HF (heart failure) (HCC)    LV (left ventricular) mural thrombus 94/76/5465   Systolic and diastolic CHF, acute on chronic (HCC)    Hypokalemia    CHF (congestive heart failure) ([place]) 09/01/2015   Respiratory failure with hypoxia ([place]) 08/31/2015   HCAP (healthcare-associated pneumonia) 08/31/2015   Acute respiratory failure with hypoxia and hypercapnia ([place])    Encounter for central line placement  Knee swelling    Subtherapeutic international normalized ratio (INR) 08/08/2015   Constipation 08/08/2015   Hyperlipidemia 08/08/2015   Blurred vision 07/29/2015   Anemia 07/29/2015   S/P total knee replacement 07/27/2015   S/P CABG x 2 10/08/2014   Chest pain 10/01/2014   Elevated INR 10/01/2014   Leukocytosis 10/01/2014   Thrombocytopenia ([place]) 10/01/2014    Chest pain at rest 10/01/2014   Unstable angina ([place]) 10/01/2014   Coagulopathy ([place]) 10/01/2014   Paroxysmal atrial fibrillation ([place]) 10/01/2014   Coronary artery disease    Hypertension    Arthritis    Peripheral vascular disease ([place])    Essential hypertension    PCP:  Celene Squibb, MD Pharmacy:   [facility] [address] [place] - [address] [place] [address] Phone: [phone] Fax: [phone]  [facility] ([facility]) - [place], [address] [place] [address] Phone: [phone] Fax: [phone]     Social Determinants of Health (SDOH) Interventions    Readmission Risk Interventions Readmission Risk Prevention Plan 07/04/2019  Transportation Screening Complete  PCP or Specialist Appt within 3-5 Days Not Complete  HRI or Time Complete  Social Work Consult for [place] Planning/Counseling Complete  Palliative Care Screening Not Applicable  Medication Review Press photographer) Complete  Some recent data might be hidden

## 2020-10-08 NOTE — NC FL2 (Signed)
[place] LEVEL OF CARE SCREENING TOOL     IDENTIFICATION  Patient Name: Benjamin Cordova Birthdate: 1934/09/05 Sex: male Admission Date (Current Location): 10/05/2020  [facility] and [place] Number:  [employer] and Address:  [place] [address], [place]      Provider Number: [id number]  Attending Physician Name and Address:  Murlean Iba, MD  Relative Name and Phone Number:  Alvah, Lagrow (Son)   [phone]    Current Level of Care: SNF Recommended Level of Care: [place] Prior Approval Number:    Date Approved/Denied: 10/08/20 PASRR Number: [id number]  Discharge Plan: SNF    Current Diagnoses: Patient Active Problem List   Diagnosis Date Noted   Hypotension 10/06/2020   Multiple rib fractures 10/05/2020   Uncontrolled pain 10/05/2020   AKI (acute kidney injury) ([place])    Weakness 06/30/2019   Shortness of breath 06/30/2019   Subtherapeutic anticoagulation    Acute on chronic respiratory failure with hypoxia and hypercapnia (HCC)    Acute on chronic systolic HF (heart failure) (HCC)    LV (left ventricular) mural thrombus 49/67/5916   Systolic and diastolic CHF, acute on chronic (HCC)    Hypokalemia    CHF (congestive heart failure) ([place]) 09/01/2015   Respiratory failure with hypoxia ([place]) 08/31/2015   HCAP (healthcare-associated pneumonia) 08/31/2015   Acute respiratory failure with hypoxia and hypercapnia (HCC)    Encounter for central line placement    Knee swelling    Subtherapeutic international normalized ratio (INR) 08/08/2015   Constipation 08/08/2015   Hyperlipidemia 08/08/2015   Blurred vision 07/29/2015   Anemia 07/29/2015   S/P total knee replacement 07/27/2015   S/P CABG x 2 10/08/2014   Chest pain 10/01/2014   Elevated INR 10/01/2014   Leukocytosis 10/01/2014   Thrombocytopenia ([place]) 10/01/2014   Chest pain at rest 10/01/2014   Unstable angina ([place])  10/01/2014   Coagulopathy ([place]) 10/01/2014   Paroxysmal atrial fibrillation ([place]) 10/01/2014   Coronary artery disease    Hypertension    Arthritis    Peripheral vascular disease (HCC)    Essential hypertension     Orientation RESPIRATION BLADDER Height & Weight     Self, Situation, Place  Normal Continent Weight: 233 lb 0.4 oz (105.7 kg) Height:  5\' 8"  (172.7 cm)  BEHAVIORAL SYMPTOMS/MOOD NEUROLOGICAL BOWEL NUTRITION STATUS      Continent Diet (Diet Heart Room service appropriate? Yes; Fluid consistency: Thin)  AMBULATORY STATUS COMMUNICATION OF NEEDS Skin   Extensive Assist Verbally Normal, Other (Comment) (Ecchymosis right arm)                       Personal Care Assistance Level of Assistance  Bathing, Feeding, Dressing Bathing Assistance: Maximum assistance Feeding assistance: Independent Dressing Assistance: Maximum assistance     Functional Limitations Info  Sight, Hearing, Speech Sight Info: Impaired Hearing Info: Impaired Speech Info: Adequate    SPECIAL CARE FACTORS FREQUENCY  PT (By licensed PT)     PT Frequency: 5x              Contractures Contractures Info: Not present    Additional Factors Info  Code Status, Allergies Code Status Info: Full Allergies Info: n/a           Current Medications (10/08/2020):  This is the current hospital active medication list Current Facility-Administered Medications  Medication Dose Route Frequency Provider Last Rate Last Admin   acetaminophen (TYLENOL)  tablet 650 mg  650 mg Oral Q6H Johnson, Clanford L, MD   650 mg at 10/08/20 1615   Or   acetaminophen (TYLENOL) suppository 650 mg  650 mg Rectal Q6H Johnson, Clanford L, MD       albuterol (PROVENTIL) (2.5 MG/3ML) 0.083% nebulizer solution 2.5 mg  2.5 mg Nebulization Q6H PRN Johnson, Clanford L, MD       ascorbic acid (VITAMIN C) tablet 1,000 mg  1,000 mg Oral BID Wynetta Emery, Clanford L, MD   1,000 mg at 10/08/20 0929   carvedilol (COREG) tablet 3.125 mg   3.125 mg Oral BID WC Johnson, Clanford L, MD   3.125 mg at 10/08/20 0749   Chlorhexidine Gluconate Cloth 2 % PADS 6 each  6 each Topical Daily Wynetta Emery, Clanford L, MD   6 each at 10/08/20 0934   diphenhydrAMINE (BENADRYL) capsule 50 mg  50 mg Oral QHS Johnson, Clanford L, MD   50 mg at 10/07/20 2235   fentaNYL (SUBLIMAZE) injection 12.5 mcg  12.5 mcg Intravenous Q2H PRN Wynetta Emery, Clanford L, MD   12.5 mcg at 10/07/20 0402   finasteride (PROSCAR) tablet 5 mg  5 mg Oral Daily Johnson, Clanford L, MD   5 mg at 10/62/69 4854   folic acid (FOLVITE) tablet 0.5 mg  500 mcg Oral Daily Johnson, Clanford L, MD   0.5 mg at 10/08/20 0930   [START ON 10/09/2020] furosemide (LASIX) tablet 40 mg  40 mg Oral Daily Johnson, Clanford L, MD       guaiFENesin-dextromethorphan (ROBITUSSIN DM) 100-10 MG/5ML syrup 5 mL  5 mL Oral Q4H PRN Johnson, Clanford L, MD   5 mL at 10/07/20 0401   levalbuterol (XOPENEX) nebulizer solution 1.25 mg  1.25 mg Nebulization Q6H PRN Johnson, Clanford L, MD       midodrine (PROAMATINE) tablet 5 mg  5 mg Oral TID WC Johnson, Clanford L, MD       ondansetron (ZOFRAN) tablet 4 mg  4 mg Oral Q6H PRN Johnson, Clanford L, MD       Or   ondansetron (ZOFRAN) injection 4 mg  4 mg Intravenous Q6H PRN Johnson, Clanford L, MD       oxyCODONE (Oxy IR/ROXICODONE) immediate release tablet 5 mg  5 mg Oral Q6H PRN Johnson, Clanford L, MD       potassium chloride (KLOR-CON) CR tablet 10 mEq  10 mEq Oral Daily Johnson, Clanford L, MD   10 mEq at 10/08/20 0930   sacubitril-valsartan (ENTRESTO) 24-26 mg per tablet  1 tablet Oral BID Johnson, Clanford L, MD       senna-docusate (Senokot-S) tablet 2 tablet  2 tablet Oral QHS Johnson, Clanford L, MD       simvastatin (ZOCOR) tablet 40 mg  40 mg Oral QHS Johnson, Clanford L, MD   40 mg at 10/07/20 2235   spironolactone (ALDACTONE) tablet 12.5 mg  12.5 mg Oral Daily Johnson, Clanford L, MD   12.5 mg at 10/08/20 0932   tamsulosin (FLOMAX) capsule 0.4 mg  0.4 mg Oral  q AM Johnson, Clanford L, MD   0.4 mg at 10/08/20 6270   Warfarin - Pharmacist Dosing Inpatient   Does not apply q1600 Murlean Iba, MD   Given at 10/07/20 1857     Discharge Medications: Please see discharge summary for a list of discharge medications.  Relevant Imaging Results:  Relevant Lab Results:   Additional Information PT SSN [id number]  Natasha Bence, LCSW

## 2020-10-09 LAB — CBC WITH DIFFERENTIAL/PLATELET
Abs Immature Granulocytes: 0.06 10*3/uL (ref 0.00–0.07)
Basophils Absolute: 0 10*3/uL (ref 0.0–0.1)
Basophils Relative: 1 %
Eosinophils Absolute: 0.2 10*3/uL (ref 0.0–0.5)
Eosinophils Relative: 4 %
HCT: 33.3 % — ABNORMAL LOW (ref 39.0–52.0)
Hemoglobin: 10.9 g/dL — ABNORMAL LOW (ref 13.0–17.0)
Immature Granulocytes: 1 %
Lymphocytes Relative: 12 %
Lymphs Abs: 0.6 10*3/uL — ABNORMAL LOW (ref 0.7–4.0)
MCH: 31.6 pg (ref 26.0–34.0)
MCHC: 32.7 g/dL (ref 30.0–36.0)
MCV: 96.5 fL (ref 80.0–100.0)
Monocytes Absolute: 0.5 10*3/uL (ref 0.1–1.0)
Monocytes Relative: 10 %
Neutro Abs: 3.9 10*3/uL (ref 1.7–7.7)
Neutrophils Relative %: 72 %
Platelets: 149 10*3/uL — ABNORMAL LOW (ref 150–400)
RBC: 3.45 MIL/uL — ABNORMAL LOW (ref 4.22–5.81)
RDW: 13.1 % (ref 11.5–15.5)
WBC: 5.3 10*3/uL (ref 4.0–10.5)
nRBC: 0 % (ref 0.0–0.2)

## 2020-10-09 LAB — BASIC METABOLIC PANEL
Anion gap: 5 (ref 5–15)
BUN: 31 mg/dL — ABNORMAL HIGH (ref 8–23)
CO2: 28 mmol/L (ref 22–32)
Calcium: 8.5 mg/dL — ABNORMAL LOW (ref 8.9–10.3)
Chloride: 100 mmol/L (ref 98–111)
Creatinine, Ser: 0.92 mg/dL (ref 0.61–1.24)
GFR, Estimated: >60 mL/min (ref >60)
Glucose, Bld: 102 mg/dL — ABNORMAL HIGH (ref 70–99)
Potassium: 4.6 mmol/L (ref 3.5–5.1)
Sodium: 133 mmol/L — ABNORMAL LOW (ref 135–145)

## 2020-10-09 LAB — PROTIME-INR
INR: 2.5 — ABNORMAL HIGH (ref 0.8–1.2)
Prothrombin Time: 26.7 seconds — ABNORMAL HIGH (ref 11.4–15.2)

## 2020-10-09 LAB — BRAIN NATRIURETIC PEPTIDE: B Natriuretic Peptide: 91 pg/mL (ref 0.0–100.0)

## 2020-10-09 MED ORDER — POLYETHYLENE GLYCOL 3350 17 G PO PACK
17.0000 g | PACK | Freq: Once | ORAL | Status: AC
  Administered 2020-10-09: 17 g via ORAL
  Filled 2020-10-09: qty 1

## 2020-10-09 MED ORDER — BISACODYL 10 MG RE SUPP
10.0000 mg | Freq: Once | RECTAL | Status: AC
  Administered 2020-10-09: 10 mg via RECTAL
  Filled 2020-10-09: qty 1

## 2020-10-09 MED ORDER — COVID-19 MRNA VACC (MODERNA) 50 MCG/0.25ML IM SUSP
0.2500 mL | Freq: Once | INTRAMUSCULAR | Status: DC
  Filled 2020-10-09: qty 0.25

## 2020-10-09 MED ORDER — BISACODYL 5 MG PO TBEC: 5.0000 mg | DELAYED_RELEASE_TABLET | Freq: Once | ORAL | Status: DC

## 2020-10-09 MED ORDER — WARFARIN SODIUM 7.5 MG PO TABS
7.5000 mg | ORAL_TABLET | Freq: Once | ORAL | Status: AC
  Administered 2020-10-09: 7.5 mg via ORAL
  Filled 2020-10-09: qty 1

## 2020-10-09 NOTE — Progress Notes (Signed)
ANTICOAGULATION CONSULT NOTE -   Pharmacy Consult for warfarin Indication: atrial fibrillation  No Known Allergies  Patient Measurements: Height: 5\' 8"  (172.7 cm) Weight: 97.5 kg (214 lb 15.2 oz) IBW/kg (Calculated) : 68.4 Heparin Dosing Weight:   Vital Signs: Temp: 97.9 F (36.6 C) (07/10 0600) Temp Source: Oral (07/10 0600) BP: 91/47 (07/10 0700) Pulse Rate: 88 (07/10 0700)  Labs: Recent Labs    10/06/20 1859 10/06/20 2052 10/07/20 0524 10/07/20 0524 10/08/20 0543 10/09/20 0201  HGB  --   --  11.8*   < > 10.9* 10.9*  HCT  --   --  36.4*  --  33.8* 33.3*  PLT  --   --  147*  --  148* 149*  LABPROT  --   --  23.5*  --  24.1* 26.7*  INR  --   --  2.1*  --  2.2* 2.5*  CREATININE  --   --  1.24  --  1.08 0.92  TROPONINIHS 24* 27*  --   --   --   --    < > = values in this interval not displayed.     Estimated Creatinine Clearance: 68.8 mL/min (by C-G formula based on SCr of 0.92 mg/dL).   Medical History: Past Medical History:  Diagnosis Date   Arthritis    Coronary artery disease    a. s/p CABG in 1981 b. 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       ascorbic acid (VITAMIN C)  500 MG tablet Take 1,000 mg by mouth 2 (two) times daily.   10/05/2020   carvedilol (COREG) 6.25 MG tablet Take 1 tablet (6.25 mg total) by mouth 2 (two) times daily with a meal. (Patient taking differently: Take 6.25 mg by mouth 2 (two) times daily with a meal. Take 1/2 tablet bid) 60 tablet 1 10/05/2020 at 01030   diphenhydramine-acetaminophen (TYLENOL PM) 25-500 MG TABS tablet Take 2 tablets by mouth at bedtime.    10/04/2020   finasteride (PROSCAR) 5 MG tablet Take 5 mg by mouth daily.   10/05/2020   furosemide (LASIX) 40 MG tablet Take 40 mg by mouth 2 (two) times daily.   10/05/2020   HYDROcodone-acetaminophen (NORCO/VICODIN) 5-325 MG tablet Take 2 tablets by mouth every 6 (six) hours as needed for moderate pain or severe pain.   10/05/2020   Krill Oil (OMEGA-3) 500 MG CAPS Take 1 capsule by mouth at bedtime.    10/05/2020   Multiple Vitamins-Minerals (CERTAVITE/ANTIOXIDANTS) TABS Take 1 tablet by mouth daily.    10/05/2020   potassium chloride (K-DUR) 10 MEQ tablet Take  10 mEq by mouth daily. Take with Lasix (Furosemide)   10/05/2020   sacubitril-valsartan (ENTRESTO) 24-26 MG Take 1 tablet by mouth 2 (two) times daily. 60 tablet 1 10/05/2020   simvastatin (ZOCOR) 40 MG tablet Take 40 mg by mouth at bedtime.    10/04/2020   spironolactone (ALDACTONE) 25 MG tablet Take 0.5 tablets (12.5 mg total) by mouth daily. 45 tablet 3 10/05/2020   tamsulosin (FLOMAX) 0.4 MG CAPS capsule Take 0.4 mg by mouth in the morning.    10/05/2020   warfarin (COUMADIN) 5 MG tablet Take 1.5 tablets (7.5 mg total) by mouth daily at 6 PM. OR AS DIRECTED by MD. (Patient taking differently: Take 7.5 mg by mouth daily at 6 PM. Take 10 mg every night except Friday take 5 mg) 60 tablet 0 05/31/5398 at 8676   folic acid (FOLVITE) 195 MCG tablet Take 400 mcg by mouth daily.       Assessment: Pharmacy consulted to dose warfarin in patient with atrial fibrillation. INR on admission is 2.0.  Home dose listed as 5 mg on Friday and 10 mg ROW. Last dose  listed as 7/3 @ 2100.  He was recently seen by his physician for hematuria on 6/24.  CBC WNL INR 2.5  Goal of Therapy:  INR 2-3 Monitor platelets by anticoagulation protocol: Yes   Plan:  Warfarin 7.5 mg x 1 dose. Monitor daily INR and s/s of bleeding.  Margot Ables, PharmD Clinical Pharmacist 10/09/2020 7:58 AM

## 2020-10-09 NOTE — TOC Progression Note (Signed)
Transition of Care [facility]) - Progression Note    Patient Details  Name: Benjamin Cordova MRN: [id number] Date of Birth: 1934-05-15  [facility] of [facility]) CM/SW Contact  Natasha Bence, LCSW Phone Number: 10/09/2020, 3:43 PM  Clinical Narrative:    Patient reported that he is vaccinated for Covid 19 and is receiving booster today. Patient requested [facility] as referred facility. CSW notified patient of Covid outbreak at [facility]. Patient still requesting [facility]. Facility to start [place]. TOC to follow.   Expected Discharge Plan: Skilled Nursing Facility Barriers to Discharge: Continued Medical Work up  Expected Discharge Plan and Services Expected Discharge Plan: [place]                                               Social Determinants of Health (SDOH) Interventions    Readmission Risk Interventions Readmission Risk Prevention Plan 07/04/2019  Transportation Screening Complete  PCP or Specialist Appt within 3-5 Days Not Complete  HRI or Home Care Consult Complete  Social Work Consult for [place] Planning/Counseling Complete  Palliative Care Screening Not Applicable  Medication Review Press photographer) Complete  Some recent data might be hidden

## 2020-10-09 NOTE — Progress Notes (Signed)
PROGRESS NOTE    [name]  ZOX:[id number] DOB: September 29, 1934 DOA: 10/05/2020 PCP: Celene Squibb, MD   Brief Narrative:    86 y.o. male with medical history significant for long-term gait instability managed with a walker, CAD s/p CABG, HTN, OA, ischemic cardiomyopathy, afib on warfarin, hyperlipidemia who had been doing fairly well living alone at home.  Unfortunately during the July 4 celebration he slipped down and fell on his right side when the wheels slipped from his walker.  He has been having severe pain on the right side of his body since that time.  He reports that he went home but he has been unable to perform activities of daily living like cooking or dressing or bathing due to severe pain involving the right side of his body.  He also has severe shoulder pain on the right side.  He reports that he did not hit his head.  There was no syncope.  There is no chest pain or shortness of breath or seizure activity associated with his fall.   During hospitalization, as patient was awaiting for acute rehab placement, patient developed shortness of breath, hypotension to 80/62 on 7/7 afternoon. Rapid response was called and Dr. Wynetta Emery assessed the patient. EKG showed sinus rhythm, HR 140s. Bolus NS 500 was administered, Xopenex neb treatment, placed on NRB and O2 sat improved to 100%. Port CXR showed no pleural effusion or acute changes. Troponin 20s-30s, likely demand ischemia. Transferred to SDU. Palliative care consulted and had [place] discussion with patient. Patient would like further discussion with his two sons. Patient had another episode of hypotension with MAP 54 later that evening and was given bolus 522ml NS, midodrine 10mg  with improvement in BP to 103/51 currently. Of note, Echo 06/2018 showed LVEF 25-30%. Echo repeated this admission given rapid response to evaluate for worsening of CHF; obtained 7/8 and pending results.   This morning, patient needed significant help sitting up and  eating breakfast. HDS and satting at 93% on RA.   Assessment & Plan:   Principal Problem:   Multiple rib fractures Active Problems:   Coronary artery disease   Hypertension   Arthritis   Peripheral vascular disease (HCC)   Paroxysmal atrial fibrillation (HCC)   S/P CABG x 2   S/P total knee replacement   CHF (congestive heart failure) (HCC)   Weakness   Uncontrolled pain   Hypotension  Multiple Rib Fractures-patient had a recent fall at home and now with significant pain and discomfort not able to perform ADLs.  Admitted for pain management.  Rib binder ordered.  Ambulate with PT.  Multimodal pain regimen as followed:  - Schedule tylenol 650 mg PO every 6 hours - Oxycodone 5mg  PO q4h PRN for breakthrough pain - Fentanyl 12.61mcg q2h PRN severe pain only. - Laxatives ordered - Fall precautions ordered  Fall at home-reportedly slipped as the wheels came off the walker.  PT evaluated patient, recommends SNF.  Consult to [facility] team for skilled nursing rehabilitation placement.   Goals of care - given advanced comorbidities and high mortality risk we have requested an inpatient palliative consultation. Pt agreeable to rehab placement and outpatient palliative care for goals of care.   Opioid induced constipation - continue scheduled peri-colace, added miralax x 1 dose and dulcolax suppository x 1.    Hypotension - patient developed hypotension to 80s/60s on 7/7.  Received bolus 1L NS, midodrine 10mg  with improvement in BP to 100s/50s. Likely secondary to volume down from diuresis and  decreased PO intake. Transferred to SDU. Will continue to monitor closely - IV lasix given on 7/8.  Resume oral lasix 40 mg daily if BP tolerates 7/9. Add midodrine 5 mg TID.  BP has been too low to titrate coreg further.  CAD s/p CABG/ischemic cardiomyopathy - Echo 7/22 with findings of severe cardiomyopathy LVEF 20%  - Echo 7/8, pending results - Cardiology consult - HOLD Lasix - restart entresto,  zocor, spironolactone  Chronic atrial fibrillation-resumed home carvedilol 3.125 mg twice daily for rate control and warfarin for full anticoagulation (dosing per pharmacy).     Acquired thrombophilia-patient is on warfarin for full anticoagulation consulted Pharm.D. for assistance with managing warfarin in the hospital.  Follow daily PT/INR testing. INR has been therapeutic.   BPH - stable on flomax and proscar  DVT prophylaxis: warfarin  Code Status: Full   Family Communication: bedside update   Disposition Plan: anticipating SNF placement   Consults called: PT   Admission status: INP     The patient remains INP appropriate .   Dispo: The patient is from: Home              Anticipated d/c is to:  SNF              Patient currently is not medically stable to d/c.               Difficult to place patient No   Consultants:  PT  Cardiology   Procedures:  Echocardiogram 7/8   1. LVEF is severely depressed with severe hypokineis of the mid/distal ventricle; distal anterior, dsital inferior and apical akinesis. Note apex is not well seen in this bedside study COmpared to 2021 no significant change . Left ventricular ejection fraction, by estimation, is 20%%. The left ventricular internal cavity  size was severely dilated. Left ventricular diastolic parameters are indeterminate.   2. Right ventricular systolic function is normal. The right ventricular size is normal. There is normal pulmonary artery systolic pressure.   3. Trivial mitral valve regurgitation.   4. The aortic valve is tricuspid. Aortic valve regurgitation is not visualized. Mild aortic valve sclerosis is present, with no evidence of aortic valve stenosis.   Antimicrobials:  N/a    Subjective: Pt having constipation. He is agreeable to trying a suppository.     Objective: Vitals:   10/09/20 0800 10/09/20 1000 10/09/20 1100 10/09/20 1200  BP: 113/65 (!) 91/40    Pulse: 98 96 81 90  Resp: (!) 24 17 16  (!) 23  Temp:  98.2 F (36.8 C)     TempSrc: Oral     SpO2: 96% 92% 91% 96%  Weight:      Height:        Intake/Output Summary (Last 24 hours) at 10/09/2020 1244 Last data filed at 10/09/2020 1200 Gross per 24 hour  Intake 720 ml  Output 2525 ml  Net -1805 ml   Filed Weights   10/05/20 1141 10/06/20 1532 10/09/20 0600  Weight: 112.5 kg 105.7 kg 97.5 kg   Examination:  General exam: he appears chronically ill, awake, appropriate,Appears in no acute distress Respiratory system: Clear to auscultation. Respiratory effort normal. Cardiovascular system: S1 & S2 heard, RRR. No JVD, murmurs, rubs, gallops or clicks. No pedal edema. Gastrointestinal system: Abdomen is nondistended, soft and nontender. No organomegaly or masses felt. Normal bowel sounds heard. Central nervous system: Alert and oriented. No focal neurological deficits. Can't self-ambulate due to pain Extremities: Symmetric 5 x 5 power. Skin: No  rashes, lesions or ulcers Psychiatry: Mood & affect appropriate.   Data Reviewed: I have personally reviewed following labs and imaging studies  CBC: Recent Labs  Lab 10/05/20 1352 10/06/20 0607 10/07/20 0524 10/08/20 0543 10/09/20 0201  WBC 8.8 7.4 6.8 5.9 5.3  NEUTROABS 7.2  --  5.4 4.3 3.9  HGB 13.3 12.3* 11.8* 10.9* 10.9*  HCT 42.3 40.5 36.4* 33.8* 33.3*  MCV 98.1 102.5* 97.3 95.8 96.5  PLT 161 137* 147* 148* 621*   Basic Metabolic Panel: Recent Labs  Lab 10/05/20 1352 10/07/20 0524 10/08/20 0543 10/09/20 0201  NA 140 134* 133* 133*  K 4.5 4.3 4.5 4.6  CL 102 99 99 100  CO2 31 27 28 28   GLUCOSE 117* 104* 88 102*  BUN 31* 38* 40* 31*  CREATININE 1.13 1.24 1.08 0.92  CALCIUM 9.4 8.6* 8.2* 8.5*   GFR: Estimated Creatinine Clearance: 68.8 mL/min (by C-G formula based on SCr of 0.92 mg/dL). Liver Function Tests: Recent Labs  Lab 10/08/20 0543  AST 19  ALT 20  ALKPHOS 40  BILITOT 0.3  PROT 5.9*  ALBUMIN 3.3*   No results for input(s): LIPASE, AMYLASE in the last  168 hours. No results for input(s): AMMONIA in the last 168 hours. Coagulation Profile: Recent Labs  Lab 10/05/20 1352 10/06/20 0607 10/07/20 0524 10/08/20 0543 10/09/20 0201  INR 2.0* 2.0* 2.1* 2.2* 2.5*   Cardiac Enzymes: No results for input(s): CKTOTAL, CKMB, CKMBINDEX, TROPONINI in the last 168 hours. BNP (last 3 results) No results for input(s): PROBNP in the last 8760 hours. HbA1C: No results for input(s): HGBA1C in the last 72 hours. CBG: No results for input(s): GLUCAP in the last 168 hours. Lipid Profile: No results for input(s): CHOL, HDL, LDLCALC, TRIG, CHOLHDL, LDLDIRECT in the last 72 hours. Thyroid Function Tests: No results for input(s): TSH, T4TOTAL, FREET4, T3FREE, THYROIDAB in the last 72 hours. Anemia Panel: No results for input(s): VITAMINB12, FOLATE, FERRITIN, TIBC, IRON, RETICCTPCT in the last 72 hours. Urine analysis:    Component Value Date/Time   COLORURINE YELLOW 07/01/2019 0445   APPEARANCEUR CLOUDY (A) 07/01/2019 0445   LABSPEC 1.027 07/01/2019 0445   PHURINE 5.0 07/01/2019 0445   GLUCOSEU NEGATIVE 07/01/2019 0445   HGBUR SMALL (A) 07/01/2019 0445   BILIRUBINUR NEGATIVE 07/01/2019 0445   KETONESUR NEGATIVE 07/01/2019 0445   PROTEINUR NEGATIVE 07/01/2019 0445   UROBILINOGEN 1.0 10/07/2014 1638   NITRITE NEGATIVE 07/01/2019 0445   LEUKOCYTESUR LARGE (A) 07/01/2019 0445    Recent Results (from the past 240 hour(s))  SARS CORONAVIRUS 2 (TAT 6-24 HRS) Nasopharyngeal Nasopharyngeal Swab     Status: None   Collection Time: 10/05/20  3:36 PM   Specimen: Nasopharyngeal Swab  Result Value Ref Range Status   SARS Coronavirus 2 NEGATIVE NEGATIVE Final    Comment: (NOTE) SARS-CoV-2 target nucleic acids are NOT DETECTED.  The SARS-CoV-2 RNA is generally detectable in upper and lower respiratory specimens during the acute phase of infection. Negative results do not preclude SARS-CoV-2 infection, do not rule out co-infections with other pathogens,  and should not be used as the sole basis for treatment or other patient management decisions. Negative results must be combined with clinical observations, patient history, and epidemiological information. The expected result is Negative.  Fact Sheet for Patients: SugarRoll.be  Fact Sheet for Healthcare Providers: https://www.woods-mathews.com/  This test is not yet approved or cleared by the [place] FDA and  has been authorized for detection and/or diagnosis of SARS-CoV-2 by FDA under  an Emergency Use Authorization (EUA). This EUA will remain  in effect (meaning this test can be used) for the duration of the COVID-19 declaration under Se ction 564(b)(1) of the Act, 21 U.S.C. section 360bbb-3(b)(1), unless the authorization is terminated or revoked sooner.  Performed at [facility], [place] [address] [place], [place] [address]   MRSA Next Gen by PCR, Nasal     Status: None   Collection Time: 10/08/20  8:30 PM   Specimen: Nasal Mucosa; Nasal Swab  Result Value Ref Range Status   MRSA by PCR Next Gen NOT DETECTED NOT DETECTED Final    Comment: (NOTE) The GeneXpert MRSA Assay (FDA approved for NASAL specimens only), is one component of a comprehensive MRSA colonization surveillance program. It is not intended to diagnose MRSA infection nor to guide or monitor treatment for MRSA infections. Test performance is not FDA approved in patients less than 50 years old. Performed at [facility], [address] [place],  [address]    Radiology Studies: No results found.  Scheduled Meds:  acetaminophen  650 mg Oral Q6H   Or   acetaminophen  650 mg Rectal Q6H   ascorbic acid  1,000 mg Oral BID   carvedilol  3.125 mg Oral BID WC   Chlorhexidine Gluconate Cloth  6 each Topical Daily   diphenhydrAMINE  50 mg Oral QHS   finasteride  5 mg Oral Daily   folic acid  092 mcg Oral Daily   furosemide  40 mg Oral Daily   midodrine  5  mg Oral TID WC   potassium chloride  10 mEq Oral Daily   sacubitril-valsartan  1 tablet Oral BID   senna-docusate  2 tablet Oral QHS   simvastatin  40 mg Oral QHS   spironolactone  12.5 mg Oral Daily   tamsulosin  0.4 mg Oral q AM   warfarin  7.5 mg Oral ONCE-1600   Warfarin - Pharmacist Dosing Inpatient   Does not apply q1600   Continuous Infusions:   LOS: 3 days   Time spent: 35 minutes  Stephaine Breshears Wynetta Emery, MD [facility] How to contact the [facility] Attending or Consulting provider 7A - 7P or covering provider during after hours [place], for this patient?  Check the care team in [facility] and look for a) attending/consulting [facility] provider listed and b) the [facility] team listed Log into [email or web address] and use [place]'s universal password to access. If you do not have the password, please contact the hospital operator. Locate the [facility] provider you are looking for under [facility] and page to a number that you can be directly reached. If you still have difficulty reaching the provider, please page the [facility] (Director on Call) for the Hospitalists listed on amion for assistance.

## 2020-10-10 DIAGNOSIS — R0600 Dyspnea, unspecified: Secondary | ICD-10-CM

## 2020-10-10 DIAGNOSIS — N179 Acute kidney failure, unspecified: Secondary | ICD-10-CM

## 2020-10-10 DIAGNOSIS — I5020 Unspecified systolic (congestive) heart failure: Secondary | ICD-10-CM

## 2020-10-10 LAB — BASIC METABOLIC PANEL
Anion gap: 3 — ABNORMAL LOW (ref 5–15)
BUN: 30 mg/dL — ABNORMAL HIGH (ref 8–23)
CO2: 28 mmol/L (ref 22–32)
Calcium: 8.3 mg/dL — ABNORMAL LOW (ref 8.9–10.3)
Chloride: 99 mmol/L (ref 98–111)
Creatinine, Ser: 1.02 mg/dL (ref 0.61–1.24)
GFR, Estimated: >60 mL/min (ref >60)
Glucose, Bld: 100 mg/dL — ABNORMAL HIGH (ref 70–99)
Potassium: 4.9 mmol/L (ref 3.5–5.1)
Sodium: 130 mmol/L — ABNORMAL LOW (ref 135–145)

## 2020-10-10 LAB — MAGNESIUM: Magnesium: 2.4 mg/dL (ref 1.7–2.4)

## 2020-10-10 LAB — RESP PANEL BY RT-PCR (FLU A&B, COVID) ARPGX2
Influenza A by PCR: NEGATIVE
Influenza B by PCR: NEGATIVE
SARS Coronavirus 2 by RT PCR: NEGATIVE

## 2020-10-10 LAB — PROTIME-INR
INR: 2.4 — ABNORMAL HIGH (ref 0.8–1.2)
Prothrombin Time: 25.9 seconds — ABNORMAL HIGH (ref 11.4–15.2)

## 2020-10-10 MED ORDER — COVID-19 MRNA VACC (MODERNA) 50 MCG/0.25ML IM SUSP
0.2500 mL | Freq: Once | INTRAMUSCULAR | Status: AC
  Administered 2020-10-10: 0.25 mL via INTRAMUSCULAR
  Filled 2020-10-10: qty 0.25

## 2020-10-10 MED ORDER — METOPROLOL SUCCINATE ER 25 MG PO TB24
12.5000 mg | ORAL_TABLET | Freq: Every day | ORAL | Status: DC
  Administered 2020-10-10 – 2020-10-11 (×2): 12.5 mg via ORAL
  Filled 2020-10-10 (×2): qty 1

## 2020-10-10 MED ORDER — WARFARIN SODIUM 7.5 MG PO TABS
7.5000 mg | ORAL_TABLET | Freq: Once | ORAL | Status: AC
  Administered 2020-10-10: 7.5 mg via ORAL
  Filled 2020-10-10: qty 1

## 2020-10-10 MED ORDER — GUAIFENESIN ER 600 MG PO TB12
600.0000 mg | ORAL_TABLET | Freq: Two times a day (BID) | ORAL | Status: DC
  Administered 2020-10-10 – 2020-10-11 (×2): 600 mg via ORAL
  Filled 2020-10-10 (×2): qty 1

## 2020-10-10 MED ORDER — FLUTICASONE PROPIONATE 50 MCG/ACT NA SUSP
2.0000 | Freq: Every day | NASAL | Status: DC
  Administered 2020-10-10 – 2020-10-11 (×2): 2 via NASAL
  Filled 2020-10-10: qty 16

## 2020-10-10 MED ORDER — DIPHENHYDRAMINE HCL 25 MG PO CAPS: 50.0000 mg | ORAL_CAPSULE | Freq: Every evening | ORAL | Status: DC | PRN

## 2020-10-10 NOTE — Progress Notes (Signed)
ANTICOAGULATION CONSULT NOTE -   Pharmacy Consult for warfarin Indication: atrial fibrillation  No Known Allergies  Patient Measurements: Height: 5\' 8"  (172.7 cm) Weight: 97.5 kg (214 lb 15.2 oz) IBW/kg (Calculated) : 68.4 Heparin Dosing Weight:   Vital Signs: Temp: 97.5 F (36.4 C) (07/11 0400) Temp Source: Oral (07/11 0400) BP: 95/46 (07/11 0400) Pulse Rate: 91 (07/11 0400)  Labs: Recent Labs    10/08/20 0543 10/09/20 0201 10/10/20 0320  HGB 10.9* 10.9*  --   HCT 33.8* 33.3*  --   PLT 148* 149*  --   LABPROT 24.1* 26.7* 25.9*  INR 2.2* 2.5* 2.4*  CREATININE 1.08 0.92 1.02     Estimated Creatinine Clearance: 62.1 mL/min (by C-G formula based on SCr of 1.02 mg/dL).   Medical History: Past Medical History:  Diagnosis Date   Arthritis    Coronary artery disease    a. s/p CABG in 1981 b. cath in 10/2014 showing occluded grafts --> redo CABG in 2016 LIMA-LAD and Left Radial-OM2   Essential hypertension    Hematuria    History of blood transfusion 07/2015   Knee replacement HgB <9   History of pneumonia    History of stroke    Ischemic cardiomyopathy    Left ventricular apical thrombus    Mixed hyperlipidemia    Myocardial infarction [facility] 1980   Peripheral vascular disease ([place])    Polio osteopathy of lower leg ([place]) Age 55   Affected right leg   Skin cancer    TIA (transient ischemic attack) 1989   Chronic coumadin   UTI (lower urinary tract infection)     Medications:  Medications Prior to Admission  Medication Sig Dispense Refill Last Dose   albuterol (PROVENTIL) (2.5 MG/3ML) 0.083% nebulizer solution Take 2.5 mg by nebulization every 6 (six) hours as needed for wheezing or shortness of breath.       ascorbic acid (VITAMIN C) 500 MG tablet Take 1,000 mg by mouth 2 (two) times daily.   10/05/2020   carvedilol (COREG) 6.25 MG tablet Take 1 tablet (6.25 mg total) by mouth 2 (two) times daily with a meal. (Patient taking differently: Take 6.25 mg by mouth 2  (two) times daily with a meal. Take 1/2 tablet bid) 60 tablet 1 10/05/2020 at 01030   diphenhydramine-acetaminophen (TYLENOL PM) 25-500 MG TABS tablet Take 2 tablets by mouth at bedtime.    10/04/2020   finasteride (PROSCAR) 5 MG tablet Take 5 mg by mouth daily.   10/05/2020   furosemide (LASIX) 40 MG tablet Take 40 mg by mouth 2 (two) times daily.   10/05/2020   [EXPIRED] HYDROcodone-acetaminophen (NORCO/VICODIN) 5-325 MG tablet Take 2 tablets by mouth every 6 (six) hours as needed for moderate pain or severe pain.   10/05/2020   Krill Oil (OMEGA-3) 500 MG CAPS Take 1 capsule by mouth at bedtime.    10/05/2020   Multiple Vitamins-Minerals (CERTAVITE/ANTIOXIDANTS) TABS Take 1 tablet by mouth daily.    10/05/2020   potassium chloride (K-DUR) 10 MEQ tablet Take 10 mEq by mouth daily. Take with Lasix (Furosemide)   10/05/2020   sacubitril-valsartan (ENTRESTO) 24-26 MG Take 1 tablet by mouth 2 (two) times daily. 60 tablet 1 10/05/2020   simvastatin (ZOCOR) 40 MG tablet Take 40 mg by mouth at bedtime.    10/04/2020   spironolactone (ALDACTONE) 25 MG tablet Take 0.5 tablets (12.5 mg total) by mouth daily. 45 tablet 3 10/05/2020   tamsulosin (FLOMAX) 0.4 MG CAPS capsule Take 0.4 mg by  mouth in the morning.    10/05/2020   warfarin (COUMADIN) 5 MG tablet Take 1.5 tablets (7.5 mg total) by mouth daily at 6 PM. OR AS DIRECTED by MD. (Patient taking differently: Take 7.5 mg by mouth daily at 6 PM. Take 10 mg every night except Friday take 5 mg) 60 tablet 0 10/01/6201 at 5597   folic acid (FOLVITE) 416 MCG tablet Take 400 mcg by mouth daily.       Assessment: Pharmacy consulted to dose warfarin in patient with atrial fibrillation. INR on admission is 2.0.  Home dose listed as 5 mg on Friday and 10 mg ROW. Last dose listed as 7/3 @ 2100.  He was recently seen by his physician for hematuria on 6/24.  CBC WNL INR 2.4  Goal of Therapy:  INR 2-3 Monitor platelets by anticoagulation protocol: Yes   Plan:  Warfarin 7.5 mg x 1  dose. Monitor daily INR and s/s of bleeding.  Margot Ables, PharmD Clinical Pharmacist 10/10/2020 7:52 AM

## 2020-10-10 NOTE — Progress Notes (Addendum)
Progress Note  Patient Name: Benjamin Cordova Date of Encounter: 10/10/2020  [facility] Cardiologist: Rozann Lesches, MD   Subjective   Had to have a breathing treatment last night. Going to [facility] today for rehab.  Inpatient Medications    Scheduled Meds:  acetaminophen  650 mg Oral Q6H   Or   acetaminophen  650 mg Rectal Q6H   ascorbic acid  1,000 mg Oral BID   carvedilol  3.125 mg Oral BID WC   Chlorhexidine Gluconate Cloth  6 each Topical Daily   COVID-19 mRNA vaccine (Moderna)  0.25 mL Intramuscular ONCE-1600   diphenhydrAMINE  50 mg Oral QHS   finasteride  5 mg Oral Daily   folic acid  353 mcg Oral Daily   furosemide  40 mg Oral Daily   midodrine  5 mg Oral TID WC   potassium chloride  10 mEq Oral Daily   sacubitril-valsartan  1 tablet Oral BID   senna-docusate  2 tablet Oral QHS   simvastatin  40 mg Oral QHS   spironolactone  12.5 mg Oral Daily   tamsulosin  0.4 mg Oral q AM   Warfarin - Pharmacist Dosing Inpatient   Does not apply q1600   Continuous Infusions:  PRN Meds: albuterol, fentaNYL (SUBLIMAZE) injection, guaiFENesin-dextromethorphan, levalbuterol, ondansetron **OR** ondansetron (ZOFRAN) IV, oxyCODONE   Vital Signs    Vitals:   10/09/20 2106 10/09/20 2249 10/10/20 0234 10/10/20 0400  BP: (!) 106/57  101/60 (!) 95/46  Pulse: 92  93 91  Resp: 18  18 18   Temp: 97.9 F (36.6 C)  98.8 F (37.1 C) (!) 97.5 F (36.4 C)  TempSrc: Oral  Oral Oral  SpO2: 95% 90% 94% 95%  Weight:      Height:        Intake/Output Summary (Last 24 hours) at 10/10/2020 0747 Last data filed at 10/10/2020 0500 Gross per 24 hour  Intake 480 ml  Output 2601 ml  Net -2121 ml   Last 3 Weights 10/09/2020 10/06/2020 10/05/2020  Weight (lbs) 214 lb 15.2 oz 233 lb 0.4 oz 248 lb 0.3 oz  Weight (kg) 97.5 kg 105.7 kg 112.5 kg      Telemetry    NSR with PVC's - Personally Reviewed  ECG      Physical Exam    GEN: No acute distress.   Neck: No JVD Cardiac: RRR,  no murmurs, rubs, or gallops.  Respiratory: decreased breath sounds with crackles at bases bilaterally. GI: a little distended  MS: No edema; No deformity. Neuro:  Nonfocal  Psych: Normal affect   Labs    High Sensitivity Troponin:   Recent Labs  Lab 10/06/20 1859 10/06/20 2052  TROPONINIHS 24* 27*      Chemistry Recent Labs  Lab 10/08/20 0543 10/09/20 0201 10/10/20 0320  NA 133* 133* 130*  K 4.5 4.6 4.9  CL 99 100 99  CO2 28 28 28   GLUCOSE 88 102* 100*  BUN 40* 31* 30*  CREATININE 1.08 0.92 1.02  CALCIUM 8.2* 8.5* 8.3*  PROT 5.9*  --   --   ALBUMIN 3.3*  --   --   AST 19  --   --   ALT 20  --   --   ALKPHOS 40  --   --   BILITOT 0.3  --   --   GFRNONAA >60 >60 >60  ANIONGAP 6 5 3*     Hematology Recent Labs  Lab 10/07/20 0524 10/08/20 0543 10/09/20  0201  WBC 6.8 5.9 5.3  RBC 3.74* 3.53* 3.45*  HGB 11.8* 10.9* 10.9*  HCT 36.4* 33.8* 33.3*  MCV 97.3 95.8 96.5  MCH 31.6 30.9 31.6  MCHC 32.4 32.2 32.7  RDW 13.3 13.3 13.1  PLT 147* 148* 149*    BNP Recent Labs  Lab 10/07/20 0525 10/08/20 0543 10/09/20 0201  BNP 113.0* 86.0 91.0     DDimer No results for input(s): DDIMER in the last 168 hours.   Radiology    No results found.  Cardiac Studies   Echo  09/07/20   1. LVEF is severely depressed with severe hypokineis of the mid/distal ventricle; distal anterior, dsital inferior and apical akinesis. Note apex is not well seen in this bedside study COmpared to 2021 no significant change . Left ventricular ejection fraction, by estimation, is 20%%. The left ventricular internal cavity size was severely dilated. Left ventricular diastolic parameters are indeterminate. 2. Right ventricular systolic function is normal. The right ventricular size is normal. There is normal pulmonary artery systolic pressure. 3. Trivial mitral valve regurgitation. 4. The aortic valve is tricuspid. Aortic valve regurgitation is not visualized. Mild aortic  valve sclerosis is present, with no evidence of aortic valve stenosis.   Limited Echocardiogram: 06/2019 IMPRESSIONS     1. Laminated mural thrombus seen in LV apex. Left ventricular ejection  fraction, by estimation, is 25 to 30%. The left ventricle has severely  decreased function. The left ventricle demonstrates global hypokinesis.  The left ventricular internal cavity  size was mildly dilated. Left ventricular diastolic parameters are  consistent with Grade I diastolic dysfunction (impaired relaxation).   2. The mitral valve is grossly normal. Trivial mitral valve  regurgitation.   3. The aortic valve is tricuspid. Aortic valve regurgitation is not  visualized. No aortic stenosis is present.   4. There is normal pulmonary artery systolic pressure.     Patient Profile     86 y.o. male  with past medical history of CAD (s/p CABG in 1981, cath in 10/2014 showing occluded grafts --> redo CABG in 2016 LIMA-LAD and Left Radial-OM2), chronic combined systolic and diastolic CHF (EF 51-02% in 2016, 40-50% in 03/2018, at 30-35% in 03/2019, 25-30% by echo in 06/2019), history of LV apical thrombus (noted on echo in 06/2019 as well - on Coumadin), HTN, HLD and history of CVA who is being seen 10/07/2020 for the evaluation of hypotension at the request of Dr. Wynetta Emery.   Assessment & Plan    Acute on chronic systolic CHF   Pt with known severe LV dysfunction   Presents after fall  patient was given IV fluids for hypotension. I/O's negative 3.7L. Lasix changed to 40 mg po daily yesterday-was on 40 mg bid PTA. Pt now back on carvedilol 3.125 mg bid, Entresto, spironolactone but BP continues to run low. Going to [facility] for Rehab today. Denies dizziness but only transitioning to chair and not walking yet. May need to hold some meds prior to discharge-discussed with Dr. Harl Bowie.    CAD   No symptoms of angina     HTN   now with hypotension.    HL   Continue simvistatin   [facility] will  sign off.   Medication Recommendations:   per Dr. Harl Bowie Other recommendations (labs, testing, etc):    Follow up as an outpatient:  11/08/20 11:30 Ermalinda Barrios PA-C  For questions or updates, please contact [place] Please consult [email or web address] for contact info under  Signed, Ermalinda Barrios, PA-C  10/10/2020, 7:47 AM    Attending note  Acute on chronic systolic HF - 0/5183 echo LVEF 25-30% - 09/2020 echo LVEF 35%, indet diastolic function. Normal RV function.  - negative  2.1 L, neg 3.7 L since admission. Stable renal function. Changed oral lasix 40mg  daily. - low bp's at times. Perhaps from some overdiuresis.  - medical therapy with entresto, coreg, aldactone 12.5 mg.   - ongoing soft bp's, stop enteresto for now. Would d/c midodrine for now, change coreg toprol. May be able to adjust regimen back at outpatient f/u.   2. Hypotension - on midodrine 5mg  tid, would look to come off in possible  in setting of his systolic dysfucntion.    3. LV thrombus - on coumadin.    For questions or updates, please contact [place] Please consult [email or web address] for contact info under        Signed, Carlyle Dolly, MD  10/10/2020, 7:55 AM

## 2020-10-10 NOTE — Care Management Important Message (Signed)
Important Message  Patient Details  Name: Benjamin Cordova MRN: [id number] Date of Birth: 1934/12/08   Medicare Important Message Given:  Yes     [name] 10/10/2020, 1:34 PM

## 2020-10-10 NOTE — Progress Notes (Signed)
PROGRESS NOTE    Benjamin Cordova  TTS:[id number] DOB: 03/03/1935 DOA: 10/05/2020 PCP: Celene Squibb, MD   Brief Narrative:  86 y.o. male with medical history significant for long-term gait instability managed with a walker, CAD s/p CABG, HTN, OA, ischemic cardiomyopathy, afib on warfarin, hyperlipidemia who had been doing fairly well living alone at home.  Unfortunately during the July 4 celebration he slipped down and fell on his right side when the wheels slipped from his walker.  He has been having severe pain on the right side of his body since that time.  He reports that he went home but he has been unable to perform activities of daily living like cooking or dressing or bathing due to severe pain involving the right side of his body.  He also has severe shoulder pain on the right side.  He reports that he did not hit his head.  There was no syncope.  There is no chest pain or shortness of breath or seizure activity associated with his fall.   During hospitalization, as patient was awaiting for acute rehab placement, patient developed shortness of breath, hypotension to 80/62 on 7/7 afternoon. Rapid response was called and Dr. Wynetta Emery assessed the patient. EKG showed sinus rhythm, HR 140s. Bolus NS 500 was administered, Xopenex neb treatment, placed on NRB and O2 sat improved to 100%. Port CXR showed no pleural effusion or acute changes. Troponin 20s-30s, likely demand ischemia. Transferred to SDU. Palliative care consulted and had [place] discussion with patient. Patient would like further discussion with his two sons. Patient had another episode of hypotension with MAP 54 later that evening and was given bolus 537ml NS, midodrine 10mg  with improvement in BP to 103/51 currently. Of note, Echo 06/2018 showed LVEF 25-30%. Echo repeated this admission given rapid response on 7/8 showed LVEF 20%, indeterminate diastolic function, normal RV function. Cardiology (Dr. Carlyle Dolly) consulted and saw patient  this admission.   This morning, patient reports that he's ready to go with pain improving.   Assessment & Plan:   Principal Problem:   Multiple rib fractures Active Problems:   Coronary artery disease   Hypertension   Arthritis   Peripheral vascular disease (HCC)   Paroxysmal atrial fibrillation (HCC)   S/P CABG x 2   S/P total knee replacement   CHF (congestive heart failure) (HCC)   Weakness   Uncontrolled pain   Hypotension   Multiple Rib Fractures-patient had a recent fall at home and now with significant pain and discomfort not able to perform ADLs.  Admitted for pain management.  Rib binder ordered.  Ambulate with PT.  Multimodal pain regimen as followed: - Schedule tylenol 650 mg PO every 6 hours - Oxycodone 5mg  PO q4h PRN for breakthrough pain - Fentanyl 12.22mcg q2h PRN severe pain only. - Laxatives ordered - Fall precautions ordered - Changed Benadryl qhs to PRN for sleep   Fall at home-reportedly slipped as the wheels came off the walker.  PT evaluated patient, recommends SNF.  Consult to [facility] team for skilled nursing rehabilitation placement.   Goals of care - given advanced comorbidities and high mortality risk we have requested an inpatient palliative consultation. Pt agreeable to rehab placement and outpatient palliative care for goals of care.    Opioid induced constipation - continue scheduled peri-colace, added miralax x 1 dose and dulcolax suppository x 1.   - Pt had bowel movement 7/11   Hypotension - patient developed hypotension to 80s/60s on 7/7.  Received  bolus 1L NS, midodrine 10mg  with improvement in BP to 100s/50s. Likely secondary to volume down from overdiuresis and decreased PO intake. Transferred to SDU. Will continue to monitor closely - IV lasix given on 7/8.  Resume oral lasix 40 mg daily if BP tolerates 7/9.  - Per Cardiology, stop entresto. D/c midodrine 7/11. Change coreg to toprol XL 7/11.    CAD s/p CABG/ischemic cardiomyopathy - Echo  7/22 with findings of severe cardiomyopathy LVEF 20% - Echo 7/8 showed stable findings, still severe hypokinesis and LVEF 20% - Cardiology consult; we appreciate recs - Hold entresto - Change coreg to toprol-xl 12.5mg  PO QD - Switch to lasix 40mg  PO daily with improved hypotension - Continue zocor, spironolactone   Chronic atrial fibrillation- change coreg to Toprol - XL 12.5mg  PO daily and warfarin for full anticoagulation (dosing per pharmacy).     Acquired thrombophilia-patient is on warfarin for full anticoagulation consulted Pharm.D. for assistance with managing warfarin in the hospital.  Follow daily PT/INR testing. INR has been therapeutic.   BPH - stable on flomax and proscar   DVT prophylaxis: warfarin  Code Status: Full   Family Communication: bedside update   Disposition Plan: anticipating SNF placement   Consults called: PT   Admission status: INP     The patient remains INP appropriate .   Dispo: The patient is from: Home              Anticipated d/c is to:  SNF - pending insurance authorization, COVID-19 booster is scheduled 1600 7/11              Patient currently is not medically stable to d/c.               Difficult to place patient No   Consultants:  PT  Cardiology   Procedures:  Echocardiogram 7/8   1. LVEF is severely depressed with severe hypokineis of the mid/distal ventricle; distal anterior, dsital inferior and apical akinesis. Note apex is not well seen in this bedside study COmpared to 2021 no significant change . Left ventricular ejection fraction, by estimation, is 20%%. The left ventricular internal cavity  size was severely dilated. Left ventricular diastolic parameters are indeterminate.   2. Right ventricular systolic function is normal. The right ventricular size is normal. There is normal pulmonary artery systolic pressure.   3. Trivial mitral valve regurgitation.   4. The aortic valve is tricuspid. Aortic valve regurgitation is not visualized.  Mild aortic valve sclerosis is present, with no evidence of aortic valve stenosis.   Antimicrobials:  N/a    Subjective: Pt reports less pain and feels ready for discharge. Awaiting COVID booster this PM.   Objective: Vitals:   10/10/20 0234 10/10/20 0400 10/10/20 0834 10/10/20 1354  BP: 101/60 (!) 95/46 106/62 (!) 96/55  Pulse: 93 91 91 95  Resp: 18 18  18   Temp: 98.8 F (37.1 C) (!) 97.5 F (36.4 C)  98.6 F (37 C)  TempSrc: Oral Oral  Oral  SpO2: 94% 95%  91%  Weight:      Height:        Intake/Output Summary (Last 24 hours) at 10/10/2020 1357 Last data filed at 10/10/2020 1352 Gross per 24 hour  Intake 720 ml  Output 3050 ml  Net -2330 ml   Filed Weights   10/05/20 1141 10/06/20 1532 10/09/20 0600  Weight: 112.5 kg 105.7 kg 97.5 kg    Examination:  General exam: Appears calm and comfortable  Respiratory system:  Clear to auscultation. Respiratory effort normal. Cardiovascular system: S1 & S2 heard, RRR. No JVD, murmurs, rubs, gallops or clicks. No pedal edema. Gastrointestinal system: Abdomen is nondistended, soft and nontender. No organomegaly or masses felt. Normal bowel sounds heard. Central nervous system: Alert and oriented. No focal neurological deficits. Extremities: Symmetric 5 x 5 power. Skin: No rashes, lesions or ulcers Psychiatry: Judgement and insight appear normal. Mood & affect appropriate.   Data Reviewed: I have personally reviewed following labs and imaging studies  CBC: Recent Labs  Lab 10/05/20 1352 10/06/20 0607 10/07/20 0524 10/08/20 0543 10/09/20 0201  WBC 8.8 7.4 6.8 5.9 5.3  NEUTROABS 7.2  --  5.4 4.3 3.9  HGB 13.3 12.3* 11.8* 10.9* 10.9*  HCT 42.3 40.5 36.4* 33.8* 33.3*  MCV 98.1 102.5* 97.3 95.8 96.5  PLT 161 137* 147* 148* 782*   Basic Metabolic Panel: Recent Labs  Lab 10/05/20 1352 10/07/20 0524 10/08/20 0543 10/09/20 0201 10/10/20 0320  NA 140 134* 133* 133* 130*  K 4.5 4.3 4.5 4.6 4.9  CL 102 99 99 100 99  CO2  31 27 28 28 28   GLUCOSE 117* 104* 88 102* 100*  BUN 31* 38* 40* 31* 30*  CREATININE 1.13 1.24 1.08 0.92 1.02  CALCIUM 9.4 8.6* 8.2* 8.5* 8.3*  MG  --   --   --   --  2.4   GFR: Estimated Creatinine Clearance: 62.1 mL/min (by C-G formula based on SCr of 1.02 mg/dL). Liver Function Tests: Recent Labs  Lab 10/08/20 0543  AST 19  ALT 20  ALKPHOS 40  BILITOT 0.3  PROT 5.9*  ALBUMIN 3.3*   No results for input(s): LIPASE, AMYLASE in the last 168 hours. No results for input(s): AMMONIA in the last 168 hours. Coagulation Profile: Recent Labs  Lab 10/06/20 0607 10/07/20 0524 10/08/20 0543 10/09/20 0201 10/10/20 0320  INR 2.0* 2.1* 2.2* 2.5* 2.4*   Cardiac Enzymes: No results for input(s): CKTOTAL, CKMB, CKMBINDEX, TROPONINI in the last 168 hours. BNP (last 3 results) No results for input(s): PROBNP in the last 8760 hours. HbA1C: No results for input(s): HGBA1C in the last 72 hours. CBG: No results for input(s): GLUCAP in the last 168 hours. Lipid Profile: No results for input(s): CHOL, HDL, LDLCALC, TRIG, CHOLHDL, LDLDIRECT in the last 72 hours. Thyroid Function Tests: No results for input(s): TSH, T4TOTAL, FREET4, T3FREE, THYROIDAB in the last 72 hours. Anemia Panel: No results for input(s): VITAMINB12, FOLATE, FERRITIN, TIBC, IRON, RETICCTPCT in the last 72 hours. Urine analysis:    Component Value Date/Time   COLORURINE YELLOW 07/01/2019 0445   APPEARANCEUR CLOUDY (A) 07/01/2019 0445   LABSPEC 1.027 07/01/2019 0445   PHURINE 5.0 07/01/2019 0445   GLUCOSEU NEGATIVE 07/01/2019 0445   HGBUR SMALL (A) 07/01/2019 0445   BILIRUBINUR NEGATIVE 07/01/2019 0445   KETONESUR NEGATIVE 07/01/2019 0445   PROTEINUR NEGATIVE 07/01/2019 0445   UROBILINOGEN 1.0 10/07/2014 1638   NITRITE NEGATIVE 07/01/2019 0445   LEUKOCYTESUR LARGE (A) 07/01/2019 0445   Recent Results (from the past 240 hour(s))  SARS CORONAVIRUS 2 (TAT 6-24 HRS) Nasopharyngeal Nasopharyngeal Swab     Status:  None   Collection Time: 10/05/20  3:36 PM   Specimen: Nasopharyngeal Swab  Result Value Ref Range Status   SARS Coronavirus 2 NEGATIVE NEGATIVE Final    Comment: (NOTE) SARS-CoV-2 target nucleic acids are NOT DETECTED.  The SARS-CoV-2 RNA is generally detectable in upper and lower respiratory specimens during the acute phase of infection. Negative results do  not preclude SARS-CoV-2 infection, do not rule out co-infections with other pathogens, and should not be used as the sole basis for treatment or other patient management decisions. Negative results must be combined with clinical observations, patient history, and epidemiological information. The expected result is Negative.  Fact Sheet for Patients: SugarRoll.be  Fact Sheet for Healthcare Providers: https://www.woods-mathews.com/  This test is not yet approved or cleared by the [place] FDA and  has been authorized for detection and/or diagnosis of SARS-CoV-2 by FDA under an Emergency Use Authorization (EUA). This EUA will remain  in effect (meaning this test can be used) for the duration of the COVID-19 declaration under Se ction 564(b)(1) of the Act, 21 U.S.C. section 360bbb-3(b)(1), unless the authorization is terminated or revoked sooner.  Performed at [facility], [place] [address] [place], [place] [address]   MRSA Next Gen by PCR, Nasal     Status: None   Collection Time: 10/08/20  8:30 PM   Specimen: Nasal Mucosa; Nasal Swab  Result Value Ref Range Status   MRSA by PCR Next Gen NOT DETECTED NOT DETECTED Final    Comment: (NOTE) The GeneXpert MRSA Assay (FDA approved for NASAL specimens only), is one component of a comprehensive MRSA colonization surveillance program. It is not intended to diagnose MRSA infection nor to guide or monitor treatment for MRSA infections. Test performance is not FDA approved in patients less than 51 years old. Performed at [facility], [address], [place], [place] [address]   Resp Panel by RT-PCR (Flu A&B, Covid) Nasopharyngeal Swab     Status: None   Collection Time: 10/10/20 11:04 AM   Specimen: Nasopharyngeal Swab; Nasopharyngeal(NP) swabs in vial transport medium  Result Value Ref Range Status   SARS Coronavirus 2 by RT PCR NEGATIVE NEGATIVE Final    Comment: (NOTE) SARS-CoV-2 target nucleic acids are NOT DETECTED.  The SARS-CoV-2 RNA is generally detectable in upper respiratory specimens during the acute phase of infection. The lowest concentration of SARS-CoV-2 viral copies this assay can detect is 138 copies/mL. A negative result does not preclude SARS-Cov-2 infection and should not be used as the sole basis for treatment or other patient management decisions. A negative result may occur with  improper specimen collection/handling, submission of specimen other than nasopharyngeal swab, presence of viral mutation(s) within the areas targeted by this assay, and inadequate number of viral copies(<138 copies/mL). A negative result must be combined with clinical observations, patient history, and epidemiological information. The expected result is Negative.  Fact Sheet for Patients:  EntrepreneurPulse.com.au  Fact Sheet for Healthcare Providers:  IncredibleEmployment.be  This test is no t yet approved or cleared by the [place] FDA and  has been authorized for detection and/or diagnosis of SARS-CoV-2 by FDA under an Emergency Use Authorization (EUA). This EUA will remain  in effect (meaning this test can be used) for the duration of the COVID-19 declaration under Section 564(b)(1) of the Act, 21 U.S.C.section 360bbb-3(b)(1), unless the authorization is terminated  or revoked sooner.       Influenza A by PCR NEGATIVE NEGATIVE Final   Influenza B by PCR NEGATIVE NEGATIVE Final    Comment: (NOTE) The Xpert Xpress SARS-CoV-2/FLU/RSV plus assay is intended as an  aid in the diagnosis of influenza from Nasopharyngeal swab specimens and should not be used as a sole basis for treatment. Nasal washings and aspirates are unacceptable for Xpert Xpress SARS-CoV-2/FLU/RSV testing.  Fact Sheet for Patients: EntrepreneurPulse.com.au  Fact Sheet for Healthcare  Providers: IncredibleEmployment.be  This test is not yet approved or cleared by the [place] and has been authorized for detection and/or diagnosis of SARS-CoV-2 by FDA under an Emergency Use Authorization (EUA). This EUA will remain in effect (meaning this test can be used) for the duration of the COVID-19 declaration under Section 564(b)(1) of the Act, 21 U.S.C. section 360bbb-3(b)(1), unless the authorization is terminated or revoked.  Performed at [facility], [address], [place], [place] [address]     Radiology Studies: No results found.  Scheduled Meds:  acetaminophen  650 mg Oral Q6H   Or   acetaminophen  650 mg Rectal Q6H   ascorbic acid  1,000 mg Oral BID   Chlorhexidine Gluconate Cloth  6 each Topical Daily   COVID-19 mRNA vaccine (Moderna)  0.25 mL Intramuscular ONCE-1600   diphenhydrAMINE  50 mg Oral QHS   finasteride  5 mg Oral Daily   folic acid  008 mcg Oral Daily   furosemide  40 mg Oral Daily   metoprolol succinate  12.5 mg Oral Daily   potassium chloride  10 mEq Oral Daily   senna-docusate  2 tablet Oral QHS   simvastatin  40 mg Oral QHS   spironolactone  12.5 mg Oral Daily   tamsulosin  0.4 mg Oral q AM   warfarin  7.5 mg Oral ONCE-1600   Warfarin - Pharmacist Dosing Inpatient   Does not apply q1600   Continuous Infusions:   LOS: 4 days   Time spent: 30 minutes  Fredrik Rigger, Medical student [facility] Pager [phone]  If 7PM-7AM, please contact night-coverage [email or web address] Password [email or web address] 10/10/2020, 1:57 PM  __________________________________________________________________ ATTENDING  PHYSICIAN  Patient seen and examined with Fredrik Rigger, Medical student. In addition to supervising the encounter, I played a key role in the decision making process as well as reviewed key findings.  Murvin Natal, MD How to contact the [facility] Attending or Consulting provider [place] or covering provider during after hours [place], for this patient?  Check the care team in [facility] and look for a) attending/consulting TRH provider listed and b) the [facility] team listed Log into [email or web address] and use [place]'s universal password to access. If you do not have the password, please contact the hospital operator. Locate the [facility] provider you are looking for under [facility] and page to a number that you can be directly reached. If you still have difficulty reaching the provider, please page the [facility] (Director on Call) for the Hospitalists listed on amion for assistance.

## 2020-10-10 NOTE — Progress Notes (Signed)
Physical Therapy Treatment Patient Details Name: Benjamin Cordova MRN: [id number] DOB: 1934/08/18 Today's Date: 10/10/2020    History of Present Illness 86 y.o. male with medical history significant for long-term gait instability managed with a walker, CAD s/p CABG, HTN, OA, ischemic cardiomyopathy, afib on warfarin, hyperlipidemia who had been doing fairly well living alone at home.  Unfortunately during the July 4 celebration he slipped down and fell on his right side when the wheels slipped from his walker.  He has been having severe pain on the right side of his body since that time.  He reports that he went home but he has been unable to perform activities of daily living like cooking or dressing or bathing due to severe pain involving the right side of his body.  He also has severe shoulder pain on the right side.  He reports that he did not hit his head.  There was no syncope.  There is no chest pain or shortness of breath or seizure activity associated with his fall.  x-rays  positive for rib fxs    PT Comments    When offered RW, patient refused stating he wanted to use his personal platform walker. Patient requiring assistance for all mobility. Patient requesting bathroom. Patient assisted to bathroom with platform walker. Upon completion of bathroom activities, patient agreeable to sitting in recliner at end of session. Patient did have difficulty maneuvering platform walker in close quarters of bathroom to recliner. This increased his risk for falls. Patient required verbal cues and modeling for transfers and sequencing of steps and placement of hands to improve safety, decrease physical assistance required and decrease risk for falls.  Patient would continue to benefit from skilled physical therapy in current environment and next venue to continue return to prior function and increase strength, endurance, balance, coordination, and functional mobility and gait skills.     Follow Up  Recommendations  SNF;Home health PT     Equipment Recommendations  None recommended by PT    Recommendations for Other Services       Precautions / Restrictions Precautions Precautions: Fall Precaution Comments: Rt rib fxs Restrictions Weight Bearing Restrictions: No    Mobility  Bed Mobility Overal bed mobility: Needs Assistance Bed Mobility: Supine to Sit     Supine to sit: Min assist;HOB elevated     General bed mobility comments: extra time, use of bedrail    Transfers   Equipment used: Bilateral platform walker Transfers: Sit to/from [facility];Anterior-Posterior Transfer Sit to Stand: Min assist Stand pivot transfers: Engineer, building services transfers: Supervision   General transfer comment: verbal and gestural cues for sequencing of steps and placement of hands  Ambulation/Gait Ambulation/Gait assistance: Min guard;Min assist Gait Distance (Feet): 20 Feet (10 feet x2) Assistive device: Bilateral platform walker Gait Pattern/deviations: Step-through pattern;Trunk flexed;Decreased step length - right;Decreased step length - left;Decreased dorsiflexion - right;Decreased stance time - right Gait velocity: decreased   General Gait Details: slow, labored gait; RA; limited by shortness of breath and dyspnea on extertion; refused use of RW; right knee hyperextended in stance phase; decreased right quad strength and knee control   Stairs             Wheelchair Mobility    Modified Rankin (Stroke Patients Only)       Balance Overall balance assessment: Needs assistance Sitting-balance support: Feet supported;No upper extremity supported Sitting balance-Leahy Scale: Fair Sitting balance - Comments: at EOB   Standing balance support: During functional  activity;Bilateral upper extremity supported Standing balance-Leahy Scale: Fair Standing balance comment: using bilateral platform walker                             Cognition Arousal/Alertness: Awake/alert Behavior During Therapy: WFL for tasks assessed/performed Overall Cognitive Status: Within Functional Limits for tasks assessed                                        Exercises      General Comments        Pertinent Vitals/Pain Pain Assessment: Faces Faces Pain Scale: Hurts little more Pain Location: R Ribs with movement Pain Intervention(s): Limited activity within patient's tolerance;Monitored during session;Repositioned    Home Living                      Prior Function            PT Goals (current goals can now be found in the care plan section) Acute Rehab PT Goals Patient Stated Goal: To go home.  Pt would like to talk about hiring a CNA to come to his home to assist him vs. SNF PT Goal Formulation: With patient Time For Goal Achievement: 10/20/20 Potential to Achieve Goals: Good Progress towards PT goals: Progressing toward goals    Frequency    Min 3X/week      PT Plan Current plan remains appropriate       AM-PAC PT "6 Clicks" Mobility   Outcome Measure  Help needed turning from your back to your side while in a flat bed without using bedrails?: A Little Help needed moving from lying on your back to sitting on the side of a flat bed without using bedrails?: A Little Help needed moving to and from a bed to a chair (including a wheelchair)?: A Lot Help needed standing up from a chair using your arms (e.g., wheelchair or bedside chair)?: A Lot Help needed to walk in hospital room?: A Lot Help needed climbing 3-5 steps with a railing? : Total 6 Click Score: 13    End of Session Equipment Utilized During Treatment: Gait belt Activity Tolerance: Patient limited by fatigue Patient left: in chair;with call bell/phone within reach;with family/visitor present Nurse Communication: Mobility status (Patient requesting breathing treatment.) PT Visit Diagnosis: Unsteadiness on feet  (R26.81);History of falling (Z91.81);Difficulty in walking, not elsewhere classified (R26.2) Pain - Right/Left: Right Pain - part of body:  (R flank)     Time: 1110-1140 PT Time Calculation (min) (ACUTE ONLY): 30 min  Charges:  $Therapeutic Activity: 23-37 mins                     Floria Raveling. Hartnett-Rands, MS, PT Per [place] [phone]  Pamala Hurry  Hartnett-Rands 10/10/2020, 11:47 AM

## 2020-10-11 DIAGNOSIS — W19XXXD Unspecified fall, subsequent encounter: Secondary | ICD-10-CM | POA: Diagnosis not present

## 2020-10-11 DIAGNOSIS — I480 Paroxysmal atrial fibrillation: Secondary | ICD-10-CM | POA: Diagnosis not present

## 2020-10-11 DIAGNOSIS — I959 Hypotension, unspecified: Secondary | ICD-10-CM | POA: Diagnosis not present

## 2020-10-11 DIAGNOSIS — M6281 Muscle weakness (generalized): Secondary | ICD-10-CM | POA: Diagnosis not present

## 2020-10-11 DIAGNOSIS — R262 Difficulty in walking, not elsewhere classified: Secondary | ICD-10-CM | POA: Diagnosis not present

## 2020-10-11 DIAGNOSIS — N390 Urinary tract infection, site not specified: Secondary | ICD-10-CM | POA: Diagnosis not present

## 2020-10-11 DIAGNOSIS — J158 Pneumonia due to other specified bacteria: Secondary | ICD-10-CM | POA: Diagnosis not present

## 2020-10-11 DIAGNOSIS — J9601 Acute respiratory failure with hypoxia: Secondary | ICD-10-CM | POA: Diagnosis not present

## 2020-10-11 DIAGNOSIS — J45909 Unspecified asthma, uncomplicated: Secondary | ICD-10-CM | POA: Diagnosis not present

## 2020-10-11 DIAGNOSIS — N179 Acute kidney failure, unspecified: Secondary | ICD-10-CM | POA: Diagnosis not present

## 2020-10-11 DIAGNOSIS — E785 Hyperlipidemia, unspecified: Secondary | ICD-10-CM | POA: Diagnosis not present

## 2020-10-11 DIAGNOSIS — Z7401 Bed confinement status: Secondary | ICD-10-CM | POA: Diagnosis not present

## 2020-10-11 DIAGNOSIS — I4891 Unspecified atrial fibrillation: Secondary | ICD-10-CM | POA: Diagnosis not present

## 2020-10-11 DIAGNOSIS — I110 Hypertensive heart disease with heart failure: Secondary | ICD-10-CM | POA: Diagnosis not present

## 2020-10-11 DIAGNOSIS — R404 Transient alteration of awareness: Secondary | ICD-10-CM | POA: Diagnosis not present

## 2020-10-11 DIAGNOSIS — R069 Unspecified abnormalities of breathing: Secondary | ICD-10-CM | POA: Diagnosis not present

## 2020-10-11 DIAGNOSIS — I5023 Acute on chronic systolic (congestive) heart failure: Secondary | ICD-10-CM | POA: Diagnosis not present

## 2020-10-11 DIAGNOSIS — R279 Unspecified lack of coordination: Secondary | ICD-10-CM | POA: Diagnosis not present

## 2020-10-11 DIAGNOSIS — I255 Ischemic cardiomyopathy: Secondary | ICD-10-CM | POA: Diagnosis not present

## 2020-10-11 DIAGNOSIS — Z951 Presence of aortocoronary bypass graft: Secondary | ICD-10-CM | POA: Diagnosis not present

## 2020-10-11 DIAGNOSIS — I491 Atrial premature depolarization: Secondary | ICD-10-CM | POA: Diagnosis not present

## 2020-10-11 DIAGNOSIS — S2241XD Multiple fractures of ribs, right side, subsequent encounter for fracture with routine healing: Secondary | ICD-10-CM | POA: Diagnosis not present

## 2020-10-11 DIAGNOSIS — R0602 Shortness of breath: Secondary | ICD-10-CM | POA: Diagnosis not present

## 2020-10-11 DIAGNOSIS — R0902 Hypoxemia: Secondary | ICD-10-CM | POA: Diagnosis not present

## 2020-10-11 DIAGNOSIS — W19XXXA Unspecified fall, initial encounter: Secondary | ICD-10-CM | POA: Diagnosis not present

## 2020-10-11 DIAGNOSIS — R918 Other nonspecific abnormal finding of lung field: Secondary | ICD-10-CM | POA: Diagnosis not present

## 2020-10-11 DIAGNOSIS — I5043 Acute on chronic combined systolic (congestive) and diastolic (congestive) heart failure: Secondary | ICD-10-CM | POA: Diagnosis not present

## 2020-10-11 DIAGNOSIS — I10 Essential (primary) hypertension: Secondary | ICD-10-CM | POA: Diagnosis not present

## 2020-10-11 DIAGNOSIS — R0600 Dyspnea, unspecified: Secondary | ICD-10-CM | POA: Diagnosis not present

## 2020-10-11 DIAGNOSIS — S2241XA Multiple fractures of ribs, right side, initial encounter for closed fracture: Secondary | ICD-10-CM | POA: Diagnosis not present

## 2020-10-11 DIAGNOSIS — Z9889 Other specified postprocedural states: Secondary | ICD-10-CM | POA: Diagnosis not present

## 2020-10-11 DIAGNOSIS — I509 Heart failure, unspecified: Secondary | ICD-10-CM | POA: Diagnosis not present

## 2020-10-11 DIAGNOSIS — M1990 Unspecified osteoarthritis, unspecified site: Secondary | ICD-10-CM | POA: Diagnosis not present

## 2020-10-11 DIAGNOSIS — I2510 Atherosclerotic heart disease of native coronary artery without angina pectoris: Secondary | ICD-10-CM | POA: Diagnosis not present

## 2020-10-11 DIAGNOSIS — E871 Hypo-osmolality and hyponatremia: Secondary | ICD-10-CM | POA: Diagnosis not present

## 2020-10-11 DIAGNOSIS — I739 Peripheral vascular disease, unspecified: Secondary | ICD-10-CM | POA: Diagnosis not present

## 2020-10-11 DIAGNOSIS — J9602 Acute respiratory failure with hypercapnia: Secondary | ICD-10-CM | POA: Diagnosis not present

## 2020-10-11 DIAGNOSIS — J189 Pneumonia, unspecified organism: Secondary | ICD-10-CM | POA: Diagnosis not present

## 2020-10-11 DIAGNOSIS — Z7901 Long term (current) use of anticoagulants: Secondary | ICD-10-CM | POA: Diagnosis not present

## 2020-10-11 DIAGNOSIS — I5020 Unspecified systolic (congestive) heart failure: Secondary | ICD-10-CM | POA: Diagnosis not present

## 2020-10-11 DIAGNOSIS — S2249XD Multiple fractures of ribs, unspecified side, subsequent encounter for fracture with routine healing: Secondary | ICD-10-CM | POA: Diagnosis not present

## 2020-10-11 DIAGNOSIS — D7589 Other specified diseases of blood and blood-forming organs: Secondary | ICD-10-CM | POA: Diagnosis not present

## 2020-10-11 LAB — PROTIME-INR
INR: 2.4 — ABNORMAL HIGH (ref 0.8–1.2)
Prothrombin Time: 25.8 seconds — ABNORMAL HIGH (ref 11.4–15.2)

## 2020-10-11 MED ORDER — ACETAMINOPHEN 325 MG PO TABS: 650.0000 mg | ORAL_TABLET | Freq: Four times a day (QID) | ORAL | Status: AC

## 2020-10-11 MED ORDER — DIPHENHYDRAMINE HCL 50 MG PO CAPS
50.0000 mg | ORAL_CAPSULE | Freq: Every evening | ORAL | 0 refills | Status: AC | PRN
Start: 2020-10-11 — End: ?

## 2020-10-11 MED ORDER — GUAIFENESIN ER 600 MG PO TB12: 600.0000 mg | ORAL_TABLET | Freq: Two times a day (BID) | ORAL | Status: AC | PRN

## 2020-10-11 MED ORDER — OXYCODONE HCL 5 MG PO TABS: 5.0000 mg | ORAL_TABLET | Freq: Four times a day (QID) | ORAL | 0 refills | Status: DC | PRN

## 2020-10-11 MED ORDER — METOPROLOL SUCCINATE ER 25 MG PO TB24: 12.5000 mg | ORAL_TABLET | Freq: Every day | ORAL | Status: DC

## 2020-10-11 MED ORDER — FUROSEMIDE 40 MG PO TABS: 40.0000 mg | ORAL_TABLET | Freq: Every day | ORAL | Status: DC

## 2020-10-11 MED ORDER — WARFARIN SODIUM 7.5 MG PO TABS: 7.5000 mg | ORAL_TABLET | Freq: Once | ORAL | Status: DC

## 2020-10-11 MED ORDER — WARFARIN SODIUM 5 MG PO TABS
7.5000 mg | ORAL_TABLET | Freq: Every day | ORAL | Status: DC
Start: 2020-10-11 — End: 2020-10-28

## 2020-10-11 MED ORDER — SENNOSIDES-DOCUSATE SODIUM 8.6-50 MG PO TABS: 1.0000 | ORAL_TABLET | Freq: Every day | ORAL | Status: AC

## 2020-10-11 NOTE — TOC Transition Note (Signed)
Transition of Care [facility]) - CM/SW Discharge Note   Patient Details  Name: Benjamin Cordova MRN: [id number] Date of Birth: 11-06-1934  Transition of Care [facility]) CM/SW Contact:  Boneta Lucks, RN Phone Number: 10/11/2020, 12:00 PM   Clinical Narrative:   Patient medically ready for discharge. Ebony Hail provided room # and number for report. TOC updated the Son. DC summary sent in the hub. Medical necessity printed on 300.  Final next level of care: Skilled Nursing Facility Barriers to Discharge: Barriers Resolved  Patient Goals and CMS Choice Patient states their goals for this hospitalization and ongoing recovery are:: Rehab with SNF CMS Medicare.gov Compare Post Acute Care list provided to:: Patient Choice offered to / list presented to : Patient  Discharge Placement              Patient chooses bed at:  [facility] and healthcare) Patient to be transferred to facility by: EMS Name of family member notified: Son- [place] Patient and family notified of of transfer: 10/11/20   Readmission Risk Interventions Readmission Risk Prevention Plan 10/11/2020 07/04/2019  Transportation Screening Complete Complete  PCP or Specialist Appt within 3-5 Days Complete Not Complete  HRI or Home Care Consult Complete Complete  Social Work Consult for [place] Planning/Counseling Complete Complete  Palliative Care Screening Not Applicable Not Applicable  Medication Review Press photographer) Complete Complete  Some recent data might be hidden

## 2020-10-11 NOTE — Care Management Important Message (Signed)
Important Message  Patient Details  Name: Benjamin Cordova MRN: [id number] Date of Birth: [birth year]   Medicare Important Message Given:  Yes     [name] 10/11/2020, 11:11 AM

## 2020-10-11 NOTE — Progress Notes (Signed)
ANTICOAGULATION CONSULT NOTE -   Pharmacy Consult for warfarin Indication: atrial fibrillation  No Known Allergies  Patient Measurements: Height: 5\' 8"  (172.7 cm) Weight: 97.5 kg (214 lb 15.2 oz) IBW/kg (Calculated) : 68.4 Heparin Dosing Weight:   Vital Signs: Temp: 98 F (36.7 C) (07/12 0426) Temp Source: Oral (07/12 0426) BP: 105/61 (07/12 0426) Pulse Rate: 94 (07/12 0426)  Labs: Recent Labs    10/09/20 0201 10/10/20 0320 10/11/20 0526  HGB 10.9*  --   --   HCT 33.3*  --   --   PLT 149*  --   --   LABPROT 26.7* 25.9* 25.8*  INR 2.5* 2.4* 2.4*  CREATININE 0.92 1.02  --      Estimated Creatinine Clearance: 62.1 mL/min (by C-G formula based on SCr of 1.02 mg/dL).   Medical History: Past Medical History:  Diagnosis Date   Arthritis    Coronary artery disease    a. s/p CABG in 1981 b. 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       ascorbic acid (VITAMIN C) 500 MG tablet Take 1,000 mg by mouth 2 (two) times daily.   10/05/2020   carvedilol (COREG) 6.25 MG tablet Take 1 tablet (6.25 mg total) by mouth 2 (two) times daily with a meal. (Patient taking differently: Take 6.25 mg by mouth 2  (two) times daily with a meal. Take 1/2 tablet bid) 60 tablet 1 10/05/2020 at 01030   diphenhydramine-acetaminophen (TYLENOL PM) 25-500 MG TABS tablet Take 2 tablets by mouth at bedtime.    10/04/2020   finasteride (PROSCAR) 5 MG tablet Take 5 mg by mouth daily.   10/05/2020   furosemide (LASIX) 40 MG tablet Take 40 mg by mouth 2 (two) times daily.   10/05/2020   [EXPIRED] HYDROcodone-acetaminophen (NORCO/VICODIN) 5-325 MG tablet Take 2 tablets by mouth every 6 (six) hours as needed for moderate pain or severe pain.   10/05/2020   Krill Oil (OMEGA-3) 500 MG CAPS Take 1 capsule by mouth at bedtime.    10/05/2020   Multiple Vitamins-Minerals (CERTAVITE/ANTIOXIDANTS) TABS Take 1 tablet by mouth daily.    10/05/2020   potassium chloride (K-DUR) 10 MEQ tablet Take 10 mEq by mouth daily. Take with Lasix (Furosemide)   10/05/2020   sacubitril-valsartan (ENTRESTO) 24-26 MG Take 1 tablet by mouth 2 (two) times daily. 60 tablet 1 10/05/2020   simvastatin (ZOCOR) 40 MG tablet Take 40 mg by mouth at bedtime.    10/04/2020   spironolactone (ALDACTONE) 25 MG tablet Take 0.5 tablets (12.5 mg total) by mouth daily. 45 tablet 3 10/05/2020   tamsulosin (FLOMAX)  0.4 MG CAPS capsule Take 0.4 mg by mouth in the morning.    10/05/2020   warfarin (COUMADIN) 5 MG tablet Take 1.5 tablets (7.5 mg total) by mouth daily at 6 PM. OR AS DIRECTED by MD. (Patient taking differently: Take 7.5 mg by mouth daily at 6 PM. Take 10 mg every night except Friday take 5 mg) 60 tablet 0 11/06/5782 at 6962   folic acid (FOLVITE) 952 MCG tablet Take 400 mcg by mouth daily.       Assessment: Pharmacy consulted to dose warfarin in patient with atrial fibrillation. INR on admission is 2.0.  Home dose listed as 5 mg on Friday and 10 mg ROW. Last dose listed as 7/3 @ 2100.  He was recently seen by his physician for hematuria on 6/24.  CBC WNL INR 2.4  Goal of Therapy:  INR 2-3 Monitor platelets by anticoagulation protocol: Yes   Plan:  Warfarin 7.5 mg x 1  dose. Monitor daily INR and s/s of bleeding.  Margot Ables, PharmD Clinical Pharmacist 10/11/2020 7:55 AM

## 2020-10-11 NOTE — Discharge Summary (Signed)
Physician Discharge Summary  Benjamin Cordova:[id number] DOB: [birth year] DOA: 10/05/2020  PCP: Celene Squibb, MD Cardiologist: Dr. Domenic Polite  Admit date: 10/05/2020  Discharge date: 10/11/2020  Admitted From: home   Disposition:  SNF [facility]  Recommendations for Outpatient Follow-up:  Follow up with PCP in 1-2 weeks Please follow up with outpatient cardiology (Cards [facility]) on 11/08/20 for review of medications  These medications were changed during the hospitalization  - Coreg switched to Toprol-XL 12.5mg  daily - Entresto discontinued - Continue PO Lasix 40mg  daily and Aldactone 12.5mg  daily.  - Please check PT/INR at least once per week - Goal INR 2-3.  - Please check BMP in 1 week to follow up on potassium level.    Home Health: none, patient transitioning to SNF  Equipment/Devices: rolling walker  Discharge Condition:Stable  CODE STATUS: Full  Diet recommendation: Heart Healthy  Brief/Interim Summary: Brief Narrative:  86 y.o. male with medical history significant for long-term gait instability managed with a walker, CAD s/p CABG, HTN, OA, ischemic cardiomyopathy, afib on warfarin, hyperlipidemia who had been doing fairly well living alone at home.  Unfortunately during the July 4 celebration he slipped down and fell on his right side when the wheels slipped from his walker.  He has been having severe pain on the right side of his body since that time.  He reports that he went home but he has been unable to perform activities of daily living like cooking or dressing or bathing due to severe pain involving the right side of his body.  He also has severe shoulder pain on the right side.  He reports that he did not hit his head.  There was no syncope.  There is no chest pain or shortness of breath or seizure activity associated with his fall.   During hospitalization, as patient was awaiting for acute rehab placement, patient developed shortness of breath, hypotension to  80/62 on 7/7 afternoon. Rapid response was called and Dr. Wynetta Emery assessed the patient. EKG showed sinus rhythm, HR 140s. Bolus NS 500 was administered, Xopenex neb treatment, placed on NRB and O2 sat improved to 100%. Port CXR showed no pleural effusion or acute changes. Troponin 20s-30s, likely demand ischemia. Transferred to SDU. Palliative care consulted and had Early discussion with patient and family. Echo 06/2018 showed LVEF 25-30%. Echo repeated this admission given rapid response on 7/8 showed LVEF 20%, indeterminate diastolic function, normal RV function. Cardiology (Dr. Carlyle Dolly) consulted and saw patient this admission. Patient is transitioned to oral lasix 40mg  daily and continuing aldactone 12.5mg  daily. Coreg switched to Toprol - XL 12.5 mg daily. Entresto discontinued.  Pt will follow up with cardiology outpatient for further medication titration.    Patient received COVID-19 mRNA Booster 7/11.    This morning, patient reports that he's ready to go with pain improving.  Discharge Diagnoses:  Principal Problem:   Multiple rib fractures Active Problems:   Coronary artery disease   Hypertension   Arthritis   Peripheral vascular disease (HCC)   Paroxysmal atrial fibrillation (HCC)   S/P CABG x 2   S/P total knee replacement   CHF (congestive heart failure) (HCC)   Weakness   Dyspnea   Uncontrolled pain   Hypotension  Principle diagnosis: Multiple rib fractures, hypotension   Discharge Instructions  These changes have been made to your medications:  - Coreg was switched to Toprol-XL 12.5 mg daily for heart failure - Discontinue entresto - Please follow up with your cardiologist in  outpatient visit 11/08/20   Discharge Instructions     Amb Referral to Palliative Care   Complete by: As directed       Allergies as of 10/11/2020   No Known Allergies      Medication List     STOP taking these medications    carvedilol 6.25 MG tablet Commonly known as: COREG    diphenhydramine-acetaminophen 25-500 MG Tabs tablet Commonly known as: TYLENOL PM Replaced by: diphenhydrAMINE 50 MG capsule   HYDROcodone-acetaminophen 5-325 MG tablet Commonly known as: NORCO/VICODIN   Omega-3 500 MG Caps   sacubitril-valsartan 24-26 MG Commonly known as: ENTRESTO       TAKE these medications    acetaminophen 325 MG tablet Commonly known as: TYLENOL Take 2 tablets (650 mg total) by mouth every 6 (six) hours.   albuterol (2.5 MG/3ML) 0.083% nebulizer solution Commonly known as: PROVENTIL Take 2.5 mg by nebulization every 6 (six) hours as needed for wheezing or shortness of breath.   ascorbic acid 500 MG tablet Commonly known as: VITAMIN C Take 1,000 mg by mouth 2 (two) times daily.   CertaVite/Antioxidants Tabs Take 1 tablet by mouth daily.   diphenhydrAMINE 50 MG capsule Commonly known as: BENADRYL Take 1 capsule (50 mg total) by mouth at bedtime as needed for sleep. Replaces: diphenhydramine-acetaminophen 25-500 MG Tabs tablet   finasteride 5 MG tablet Commonly known as: PROSCAR Take 5 mg by mouth daily.   folic acid 818 MCG tablet Commonly known as: FOLVITE Take 400 mcg by mouth daily.   furosemide 40 MG tablet Commonly known as: LASIX Take 1 tablet (40 mg total) by mouth daily. What changed: when to take this   guaiFENesin 600 MG 12 hr tablet Commonly known as: MUCINEX Take 1 tablet (600 mg total) by mouth 2 (two) times daily as needed for up to 5 days for to loosen phlegm or cough.   metoprolol succinate 25 MG 24 hr tablet Commonly known as: TOPROL-XL Take 0.5 tablets (12.5 mg total) by mouth daily. Start taking on: October 12, 2020   oxyCODONE 5 MG immediate release tablet Commonly known as: Oxy IR/ROXICODONE Take 1 tablet (5 mg total) by mouth every 6 (six) hours as needed for severe pain.   potassium chloride 10 MEQ tablet Commonly known as: KLOR-CON Take 10 mEq by mouth daily. Take with Lasix (Furosemide)   senna-docusate  8.6-50 MG tablet Commonly known as: Senokot-S Take 1 tablet by mouth at bedtime.   simvastatin 40 MG tablet Commonly known as: ZOCOR Take 40 mg by mouth at bedtime.   spironolactone 25 MG tablet Commonly known as: ALDACTONE Take 0.5 tablets (12.5 mg total) by mouth daily.   tamsulosin 0.4 MG Caps capsule Commonly known as: FLOMAX Take 0.4 mg by mouth in the morning.   warfarin 5 MG tablet Commonly known as: COUMADIN Take 1.5 tablets (7.5 mg total) by mouth daily at 6 PM. Take 10 mg every night except Friday take 5 mg        Contact information for follow-up providers     Imogene Burn, PA-C Follow up on 11/08/2020.   Specialty: Cardiology Why: Cardiology Follow-up on 11/08/2020 at 11:30 AM. Contact information: [place] [address] [phone]         Celene Squibb, MD. Schedule an appointment as soon as possible for a visit in 2 week(s).   Specialty: Internal Medicine Why: Hospital Follow Up Contact information: [place] [address] [phone]  Contact information for after-discharge care     [facility] .   Service: Skilled Nursing Contact information: [address] [phone]                    No Known Allergies  Consultations: Cardiology PT  Procedures/Studies: DG Ribs Unilateral W/Chest Right  Result Date: 10/05/2020 CLINICAL DATA:  Right chest pain after a fall today. Initial encounter. EXAM: RIGHT RIBS AND CHEST - 3+ VIEW COMPARISON:  CT chest and single view of the chest 09/15/2019. FINDINGS: The patient has acute fractures of right sixth and seventh ribs. The right sixth rib fracture is nondisplaced. There is mild superior displacement of the distal fragment of the seventh rib. Small left pleural effusion and chronic basilar atelectasis appear unchanged. Heart size is upper normal. The  patient is status post CABG. No pneumothorax is identified. IMPRESSION: Acute right sixth and seventh rib fractures. Negative for pneumothorax. No change in a small left pleural effusion and basilar atelectasis. Electronically Signed   By: Inge Rise M.D.   On: 10/05/2020 14:14   DG Shoulder Right  Result Date: 10/05/2020 CLINICAL DATA:  Status post fall.  Right shoulder pain. EXAM: RIGHT SHOULDER - 2+ VIEW COMPARISON:  None. FINDINGS: There is no evidence of fracture or dislocation. Mild degenerative changes noted at the acromioclavicular joint. There is an acute fracture involving the posterolateral aspect of the approximate right seventh rib. Soft tissues are unremarkable. IMPRESSION: Acute fracture involves the posterolateral aspect of the approximate right seventh rib. No signs of shoulder fracture. Electronically Signed   By: Kerby Moors M.D.   On: 10/05/2020 13:44   DG CHEST PORT 1 VIEW  Result Date: 10/07/2020 CLINICAL DATA:  86 year old male with history of dyspnea and multiple right-sided rib fractures. EXAM: PORTABLE CHEST 1 VIEW COMPARISON:  Chest x-ray 10/06/2020. FINDINGS: Low lung volumes. Chronic areas of pleuroparenchymal thickening and architectural distortion and chronic left pleural effusion, similar to prior examinations. Opacity at the left base and in the left mid lung periphery corresponds to areas of chronic rounded atelectasis on prior chest CT. Right lung is clear. No right pleural effusion. No pneumothorax. No evidence of pulmonary edema. Heart size is normal. Upper mediastinal contours are within normal limits. Aortic atherosclerosis. Status post median sternotomy. Minimally displaced fractures of the lateral aspects of the right sixth, seventh and eighth ribs again noted. IMPRESSION: 1. Acute minimally displaced fractures of the right sixth, seventh and eighth ribs. No right-sided pneumothorax. 2. Low lung volumes with chronic areas of rounded atelectasis in the left lung  and chronic small left pleural effusion, similar to prior examinations. 3. Aortic atherosclerosis. Electronically Signed   By: Vinnie Langton M.D.   On: 10/07/2020 05:31   DG CHEST PORT 1 VIEW  Result Date: 10/06/2020 CLINICAL DATA:  Shortness of breath.  Known rib fractures. EXAM: PORTABLE CHEST 1 VIEW COMPARISON:  Chest x-ray 10/05/2020 FINDINGS: Chronic left pleural effusion and left lower lobe atelectasis with the appearance of rounded atelectasis on the prior chest CT. Chronic scarring changes involving the right lung. No definite acute pulmonary findings. Stable right-sided rib fractures but no pneumothorax or pleural effusion. IMPRESSION: 1. Stable chronic left pleural effusion and left lower lobe atelectasis. 2. Stable right-sided rib fractures. Electronically Signed   By: Marijo Sanes M.D.   On: 10/06/2020 15:37   ECHOCARDIOGRAM COMPLETE  Result Date: 10/07/2020    ECHOCARDIOGRAM  REPORT   Patient Name:   Benjamin Cordova Date of Exam: 10/07/2020 Medical Rec #:  [id number]       Height:       68.0 in Accession #:    [id number]      Weight:       233.0 lb Date of Birth:  Mar 30, 1935       BSA:          2.181 m Patient Age:    33 years        BP:           95/56 mmHg Patient Gender: M               HR:           85 bpm. Exam Location:  [facility] Procedure: 2D Echo, Cardiac Doppler and Color Doppler Indications:    Dyspnea  History:        Patient has prior history of Echocardiogram examinations, most                 recent 07/01/2019. CHF, CAD, Prior CABG, Arrythmias:Atrial                 Fibrillation, Signs/Symptoms:Shortness of Breath; Risk                 Factors:Hypertension and Former Smoker.  Sonographer:    Wenda Low Referring Phys: [place] Comments: Patient refused the use of Definity. IMPRESSIONS  1. LVEF is severely depressed with severe hypokineis of the mid/distal ventricle; distal anterior, dsital inferior and apical akinesis. Note apex is not well seen in  this bedside study COmpared to 2021 no significant change . Left ventricular ejection fraction, by estimation, is 20%%. The left ventricular internal cavity size was severely dilated. Left ventricular diastolic parameters are indeterminate.  2. Right ventricular systolic function is normal. The right ventricular size is normal. There is normal pulmonary artery systolic pressure.  3. Trivial mitral valve regurgitation.  4. The aortic valve is tricuspid. Aortic valve regurgitation is not visualized. Mild aortic valve sclerosis is present, with no evidence of aortic valve stenosis. FINDINGS  Left Ventricle: LVEF is severely depressed with severe hypokineis of the mid/distal ventricle; distal anterior, dsital inferior and apical akinesis. Note apex is not well seen in this bedside study COmpared to 2021 no significant change. Left ventricular ejection fraction, by estimation, is 20%%. The left ventricular internal cavity size was severely dilated. There is no left ventricular hypertrophy. Left ventricular diastolic parameters are indeterminate. Right Ventricle: The right ventricular size is normal. Right vetricular wall thickness was not assessed. Right ventricular systolic function is normal. There is normal pulmonary artery systolic pressure. The tricuspid regurgitant velocity is 1.72 m/s, and with an assumed right atrial pressure of 3 mmHg, the estimated right ventricular systolic pressure is 60.6 mmHg. Left Atrium: Left atrial size was normal in size. Right Atrium: Right atrial size was normal in size. Pericardium: Trivial pericardial effusion is present. Mitral Valve: There is mild thickening of the mitral valve leaflet(s). Trivial mitral valve regurgitation. MV peak gradient, 5.6 mmHg. The mean mitral valve gradient is 2.0 mmHg. Tricuspid Valve: The tricuspid valve is normal in structure. Tricuspid valve regurgitation is trivial. Aortic Valve: The aortic valve is tricuspid. Aortic valve regurgitation is not  visualized. Mild aortic valve sclerosis is present, with no evidence of aortic valve stenosis. Aortic valve mean gradient measures 3.0 mmHg. Aortic valve peak gradient measures 7.1 mmHg. Aortic valve  area, by VTI measures 3.10 cm. Pulmonic Valve: The pulmonic valve was normal in structure. Pulmonic valve regurgitation is not visualized. Aorta: The aortic root is normal in size and structure. IAS/Shunts: No atrial level shunt detected by color flow Doppler.  LEFT VENTRICLE PLAX 2D LVIDd:         6.31 cm  Diastology LVIDs:         5.80 cm  LV e' medial:    12.60 cm/s LV PW:         1.04 cm  LV E/e' medial:  7.5 LV IVS:        1.13 cm  LV e' lateral:   13.20 cm/s LVOT diam:     2.10 cm  LV E/e' lateral: 7.2 LV SV:         69 LV SV Index:   32 LVOT Area:     3.46 cm  RIGHT VENTRICLE RV Basal diam:  3.62 cm TAPSE (M-mode): 2.4 cm LEFT ATRIUM             Index       RIGHT ATRIUM           Index LA diam:        4.50 cm 2.06 cm/m  RA Area:     15.40 cm LA Vol (A2C):   66.7 ml 30.58 ml/m RA Volume:   34.80 ml  15.96 ml/m LA Vol (A4C):   65.5 ml 30.03 ml/m LA Biplane Vol: 67.7 ml 31.04 ml/m  AORTIC VALVE AV Area (Vmax):    2.73 cm AV Area (Vmean):   2.79 cm AV Area (VTI):     3.10 cm AV Vmax:           133.00 cm/s AV Vmean:          78.800 cm/s AV VTI:            0.222 m AV Peak Grad:      7.1 mmHg AV Mean Grad:      3.0 mmHg LVOT Vmax:         105.00 cm/s LVOT Vmean:        63.500 cm/s LVOT VTI:          0.199 m LVOT/AV VTI ratio: 0.90  AORTA Ao Root diam: 3.40 cm Ao Asc diam:  3.10 cm MITRAL VALVE               TRICUSPID VALVE MV Area (PHT): 5.97 cm    TR Peak grad:   11.8 mmHg MV Area VTI:   3.57 cm    TR Vmax:        172.00 cm/s MV Peak grad:  5.6 mmHg MV Mean grad:  2.0 mmHg    SHUNTS MV Vmax:       1.18 m/s    Systemic VTI:  0.20 m MV Vmean:      60.6 cm/s   Systemic Diam: 2.10 cm MV Decel Time: 127 msec MV E velocity: 94.60 cm/s Dorris Carnes MD Electronically signed by Dorris Carnes MD Signature Date/Time:  10/07/2020/4:22:38 PM    Final      Discharge Exam: Vitals:   10/10/20 2039 10/11/20 0426  BP: 111/60 105/61  Pulse: 94 94  Resp: 18 18  Temp: 97.7 F (36.5 C) 98 F (36.7 C)  SpO2: 95% 95%   Vitals:   10/10/20 0834 10/10/20 1354 10/10/20 2039 10/11/20 0426  BP: 106/62 (!) 96/55 111/60 105/61  Pulse: 91 95 94 94  Resp:  18  18 18  Temp:  98.6 F (37 C) 97.7 F (36.5 C) 98 F (36.7 C)  TempSrc:  Oral Oral Oral  SpO2:  91% 95% 95%  Weight:      Height:       General exam: Appears calm and comfortable Respiratory system: Clear to auscultation. Respiratory effort normal. Cardiovascular system: S1 & S2 heard, RRR. No JVD, murmurs, rubs, gallops or clicks. No pedal edema. Gastrointestinal system: Abdomen is nondistended, soft and nontender. No organomegaly or masses felt. Normal bowel sounds heard. Central nervous system: Alert and oriented. No focal neurological deficits. Extremities: Symmetric 5 x 5 power. Skin: No rashes, lesions or ulcers Psychiatry: Judgement and insight appear normal. Mood & affect appropriate.   The results of significant diagnostics from this hospitalization (including imaging, microbiology, ancillary and laboratory) are listed below for reference.     Microbiology: Recent Results (from the past 240 hour(s))  SARS CORONAVIRUS 2 (TAT 6-24 HRS) Nasopharyngeal Nasopharyngeal Swab     Status: None   Collection Time: 10/05/20  3:36 PM   Specimen: Nasopharyngeal Swab  Result Value Ref Range Status   SARS Coronavirus 2 NEGATIVE NEGATIVE Final    Comment: (NOTE) SARS-CoV-2 target nucleic acids are NOT DETECTED.  The SARS-CoV-2 RNA is generally detectable in upper and lower respiratory specimens during the acute phase of infection. Negative results do not preclude SARS-CoV-2 infection, do not rule out co-infections with other pathogens, and should not be used as the sole basis for treatment or other patient management decisions. Negative results must be  combined with clinical observations, patient history, and epidemiological information. The expected result is Negative.  Fact Sheet for Patients: SugarRoll.be  Fact Sheet for Healthcare Providers: https://www.woods-mathews.com/  This test is not yet approved or cleared by the [place] FDA and  has been authorized for detection and/or diagnosis of SARS-CoV-2 by FDA under an Emergency Use Authorization (EUA). This EUA will remain  in effect (meaning this test can be used) for the duration of the COVID-19 declaration under Se ction 564(b)(1) of the Act, 21 U.S.C. section 360bbb-3(b)(1), unless the authorization is terminated or revoked sooner.  Performed at [facility], [place] [address], [place], [place] [address]   MRSA Next Gen by PCR, Nasal     Status: None   Collection Time: 10/08/20  8:30 PM   Specimen: Nasal Mucosa; Nasal Swab  Result Value Ref Range Status   MRSA by PCR Next Gen NOT DETECTED NOT DETECTED Final    Comment: (NOTE) The GeneXpert MRSA Assay (FDA approved for NASAL specimens only), is one component of a comprehensive MRSA colonization surveillance program. It is not intended to diagnose MRSA infection nor to guide or monitor treatment for MRSA infections. Test performance is not FDA approved in patients less than 38 years old. Performed at [facility], [address] [place], [place] [address]   Resp Panel by RT-PCR (Flu A&B, Covid) Nasopharyngeal Swab     Status: None   Collection Time: 10/10/20 11:04 AM   Specimen: Nasopharyngeal Swab; Nasopharyngeal(NP) swabs in vial transport medium  Result Value Ref Range Status   SARS Coronavirus 2 by RT PCR NEGATIVE NEGATIVE Final    Comment: (NOTE) SARS-CoV-2 target nucleic acids are NOT DETECTED.  The SARS-CoV-2 RNA is generally detectable in upper respiratory specimens during the acute phase of infection. The lowest concentration of SARS-CoV-2 viral copies this  assay can detect is 138 copies/mL. A negative result does not preclude SARS-Cov-2 infection and should not be  used as the sole basis for treatment or other patient management decisions. A negative result may occur with  improper specimen collection/handling, submission of specimen other than nasopharyngeal swab, presence of viral mutation(s) within the areas targeted by this assay, and inadequate number of viral copies(<138 copies/mL). A negative result must be combined with clinical observations, patient history, and epidemiological information. The expected result is Negative.  Fact Sheet for Patients:  EntrepreneurPulse.com.au  Fact Sheet for Healthcare Providers:  IncredibleEmployment.be  This test is no t yet approved or cleared by the [place] FDA and  has been authorized for detection and/or diagnosis of SARS-CoV-2 by FDA under an Emergency Use Authorization (EUA). This EUA will remain  in effect (meaning this test can be used) for the duration of the COVID-19 declaration under Section 564(b)(1) of the Act, 21 U.S.C.section 360bbb-3(b)(1), unless the authorization is terminated  or revoked sooner.       Influenza A by PCR NEGATIVE NEGATIVE Final   Influenza B by PCR NEGATIVE NEGATIVE Final    Comment: (NOTE) The Xpert Xpress SARS-CoV-2/FLU/RSV plus assay is intended as an aid in the diagnosis of influenza from Nasopharyngeal swab specimens and should not be used as a sole basis for treatment. Nasal washings and aspirates are unacceptable for Xpert Xpress SARS-CoV-2/FLU/RSV testing.  Fact Sheet for Patients: EntrepreneurPulse.com.au  Fact Sheet for Healthcare Providers: IncredibleEmployment.be  This test is not yet approved or cleared by the [place] FDA and has been authorized for detection and/or diagnosis of SARS-CoV-2 by FDA under an Emergency Use Authorization (EUA). This EUA will  remain in effect (meaning this test can be used) for the duration of the COVID-19 declaration under Section 564(b)(1) of the Act, 21 U.S.C. section 360bbb-3(b)(1), unless the authorization is terminated or revoked.  Performed at [facility], [address], [place], [place] [address]      Labs: BNP (last 3 results) Recent Labs    10/07/20 0525 10/08/20 0543 10/09/20 0201  BNP 113.0* 86.0 38.1   Basic Metabolic Panel: Recent Labs  Lab 10/05/20 1352 10/07/20 0524 10/08/20 0543 10/09/20 0201 10/10/20 0320  NA 140 134* 133* 133* 130*  K 4.5 4.3 4.5 4.6 4.9  CL 102 99 99 100 99  CO2 31 27 28 28 28   GLUCOSE 117* 104* 88 102* 100*  BUN 31* 38* 40* 31* 30*  CREATININE 1.13 1.24 1.08 0.92 1.02  CALCIUM 9.4 8.6* 8.2* 8.5* 8.3*  MG  --   --   --   --  2.4   Liver Function Tests: Recent Labs  Lab 10/08/20 0543  AST 19  ALT 20  ALKPHOS 40  BILITOT 0.3  PROT 5.9*  ALBUMIN 3.3*   No results for input(s): LIPASE, AMYLASE in the last 168 hours. No results for input(s): AMMONIA in the last 168 hours. CBC: Recent Labs  Lab 10/05/20 1352 10/06/20 0607 10/07/20 0524 10/08/20 0543 10/09/20 0201  WBC 8.8 7.4 6.8 5.9 5.3  NEUTROABS 7.2  --  5.4 4.3 3.9  HGB 13.3 12.3* 11.8* 10.9* 10.9*  HCT 42.3 40.5 36.4* 33.8* 33.3*  MCV 98.1 102.5* 97.3 95.8 96.5  PLT 161 137* 147* 148* 149*   Cardiac Enzymes: No results for input(s): CKTOTAL, CKMB, CKMBINDEX, TROPONINI in the last 168 hours. BNP: Invalid input(s): POCBNP CBG: No results for input(s): GLUCAP in the last 168 hours. D-Dimer No results for input(s): DDIMER in the last 72 hours. Hgb A1c No results for input(s): HGBA1C in the last 72 hours. Lipid Profile  No results for input(s): CHOL, HDL, LDLCALC, TRIG, CHOLHDL, LDLDIRECT in the last 72 hours. Thyroid function studies No results for input(s): TSH, T4TOTAL, T3FREE, THYROIDAB in the last 72 hours.  Invalid input(s): FREET3 Anemia work up No results for input(s):  VITAMINB12, FOLATE, FERRITIN, TIBC, IRON, RETICCTPCT in the last 72 hours. Urinalysis    Component Value Date/Time   COLORURINE YELLOW 07/01/2019 0445   APPEARANCEUR CLOUDY (A) 07/01/2019 0445   LABSPEC 1.027 07/01/2019 0445   PHURINE 5.0 07/01/2019 0445   GLUCOSEU NEGATIVE 07/01/2019 0445   HGBUR SMALL (A) 07/01/2019 0445   BILIRUBINUR NEGATIVE 07/01/2019 0445   KETONESUR NEGATIVE 07/01/2019 0445   PROTEINUR NEGATIVE 07/01/2019 0445   UROBILINOGEN 1.0 10/07/2014 1638   NITRITE NEGATIVE 07/01/2019 0445   LEUKOCYTESUR LARGE (A) 07/01/2019 0445   Sepsis Labs Invalid input(s): PROCALCITONIN,  WBC,  LACTICIDVEN Microbiology Recent Results (from the past 240 hour(s))  SARS CORONAVIRUS 2 (TAT 6-24 HRS) Nasopharyngeal Nasopharyngeal Swab     Status: None   Collection Time: 10/05/20  3:36 PM   Specimen: Nasopharyngeal Swab  Result Value Ref Range Status   SARS Coronavirus 2 NEGATIVE NEGATIVE Final    Comment: (NOTE) SARS-CoV-2 target nucleic acids are NOT DETECTED.  The SARS-CoV-2 RNA is generally detectable in upper and lower respiratory specimens during the acute phase of infection. Negative results do not preclude SARS-CoV-2 infection, do not rule out co-infections with other pathogens, and should not be used as the sole basis for treatment or other patient management decisions. Negative results must be combined with clinical observations, patient history, and epidemiological information. The expected result is Negative.  Fact Sheet for Patients: SugarRoll.be  Fact Sheet for Healthcare Providers: https://www.woods-mathews.com/  This test is not yet approved or cleared by the [place] FDA and  has been authorized for detection and/or diagnosis of SARS-CoV-2 by FDA under an Emergency Use Authorization (EUA). This EUA will remain  in effect (meaning this test can be used) for the duration of the COVID-19 declaration under Se ction  564(b)(1) of the Act, 21 U.S.C. section 360bbb-3(b)(1), unless the authorization is terminated or revoked sooner.  Performed at [facility], [place] [address], [place], [place] [address]   MRSA Next Gen by PCR, Nasal     Status: None   Collection Time: 10/08/20  8:30 PM   Specimen: Nasal Mucosa; Nasal Swab  Result Value Ref Range Status   MRSA by PCR Next Gen NOT DETECTED NOT DETECTED Final    Comment: (NOTE) The GeneXpert MRSA Assay (FDA approved for NASAL specimens only), is one component of a comprehensive MRSA colonization surveillance program. It is not intended to diagnose MRSA infection nor to guide or monitor treatment for MRSA infections. Test performance is not FDA approved in patients less than 4 years old. Performed at [facility], [address], [place], [place] [address]   Resp Panel by RT-PCR (Flu A&B, Covid) Nasopharyngeal Swab     Status: None   Collection Time: 10/10/20 11:04 AM   Specimen: Nasopharyngeal Swab; Nasopharyngeal(NP) swabs in vial transport medium  Result Value Ref Range Status   SARS Coronavirus 2 by RT PCR NEGATIVE NEGATIVE Final    Comment: (NOTE) SARS-CoV-2 target nucleic acids are NOT DETECTED.  The SARS-CoV-2 RNA is generally detectable in upper respiratory specimens during the acute phase of infection. The lowest concentration of SARS-CoV-2 viral copies this assay can detect is 138 copies/mL. A negative result does not preclude SARS-Cov-2 infection and should not be used as  the sole basis for treatment or other patient management decisions. A negative result may occur with  improper specimen collection/handling, submission of specimen other than nasopharyngeal swab, presence of viral mutation(s) within the areas targeted by this assay, and inadequate number of viral copies(<138 copies/mL). A negative result must be combined with clinical observations, patient history, and epidemiological information. The expected result is  Negative.  Fact Sheet for Patients:  EntrepreneurPulse.com.au  Fact Sheet for Healthcare Providers:  IncredibleEmployment.be  This test is no t yet approved or cleared by the [place] FDA and  has been authorized for detection and/or diagnosis of SARS-CoV-2 by FDA under an Emergency Use Authorization (EUA). This EUA will remain  in effect (meaning this test can be used) for the duration of the COVID-19 declaration under Section 564(b)(1) of the Act, 21 U.S.C.section 360bbb-3(b)(1), unless the authorization is terminated  or revoked sooner.       Influenza A by PCR NEGATIVE NEGATIVE Final   Influenza B by PCR NEGATIVE NEGATIVE Final    Comment: (NOTE) The Xpert Xpress SARS-CoV-2/FLU/RSV plus assay is intended as an aid in the diagnosis of influenza from Nasopharyngeal swab specimens and should not be used as a sole basis for treatment. Nasal washings and aspirates are unacceptable for Xpert Xpress SARS-CoV-2/FLU/RSV testing.  Fact Sheet for Patients: EntrepreneurPulse.com.au  Fact Sheet for Healthcare Providers: IncredibleEmployment.be  This test is not yet approved or cleared by the [place] FDA and has been authorized for detection and/or diagnosis of SARS-CoV-2 by FDA under an Emergency Use Authorization (EUA). This EUA will remain in effect (meaning this test can be used) for the duration of the COVID-19 declaration under Section 564(b)(1) of the Act, 21 U.S.C. section 360bbb-3(b)(1), unless the authorization is terminated or revoked.  Performed at [facility], [address], [place], [place] [address]    Time coordinating discharge: 45 minutes  SIGNED:   Irwin Brakeman, MD Fredrik Rigger, [place], [facility] 10/11/2020, 12:04 PM How to contact the [facility] Attending or Consulting provider [place] or covering provider during after hours [place], for this patient?  Check the care team  in [facility] and look for a) attending/consulting TRH provider listed and b) the [facility] team listed Log into [email or web address] and use [place]'s universal password to access. If you do not have the password, please contact the hospital operator. Locate the [facility] provider you are looking for under [facility] and page to a number that you can be directly reached. If you still have difficulty reaching the provider, please page the [facility] (Director on Call) for the Hospitalists listed on amion for assistance.  If 7PM-7AM, please contact night-coverage [email or web address] ____________________________________________________ ATTENDING NOTE  Patient seen and examined with Fredrik Rigger, Medical student. In addition to supervising the encounter, I played a key role in the decision making process as well as reviewed key findings.  Pt is medically ready today for discharge to SNF for additional rehab therapy.  See medication changes.  Pt is aware.  Please monitor PT/INR.  Please check BMP in 1 week.  Please follow up with cardiology outpatient.    Gerlene Fee, MD FAAFP How to contact the [facility] Attending or Consulting provider [place] or covering provider during after hours [place], for this patient?  Check the care team in [facility] and look for a) attending/consulting TRH provider listed and b) the [facility] team listed Log into [email or web address] and use [place]'s universal password to access. If you do not  have the password, please contact the hospital operator. Locate the [facility] provider you are looking for under [facility] and page to a number that you can be directly reached. If you still have difficulty reaching the provider, please page the [facility] (Director on Call) for the Hospitalists listed on amion for assistance.

## 2020-10-11 NOTE — Discharge Instructions (Signed)

## 2020-10-11 NOTE — Progress Notes (Signed)
   Refer to full-rounding note from 10/10/2020. The patient was having positional dizziness in the setting of hypotension. Delene Loll has been held with Lasix being transitioned to PO Lasix 40mg  daily and continuing on Aldactone 12.5mg  daily. Coreg switched to Toprol-XL 12.5mg  daily. BP has been stable at 96/55 - 111/60 within the past 24 hours with Midodrine being discontinued yesterday. Outpatient Cardiology follow-up has been arranged as hopefully medical therapy can be titrated at that time if BP allows.   Signed, Erma Heritage, PA-C 10/11/2020, 9:35 AM Pager: [phone]

## 2020-10-11 NOTE — Progress Notes (Signed)
Pt has discharge orders to the [facility] center. Report called and given to [place], no further questions at this time. Pt left via EMS with walker and belongings. Hearing aides in both ears. Discharge paperwork given to EMS workers with no further questions.

## 2020-10-12 DIAGNOSIS — S2241XD Multiple fractures of ribs, right side, subsequent encounter for fracture with routine healing: Secondary | ICD-10-CM | POA: Diagnosis not present

## 2020-10-12 DIAGNOSIS — I509 Heart failure, unspecified: Secondary | ICD-10-CM | POA: Diagnosis not present

## 2020-10-12 DIAGNOSIS — I739 Peripheral vascular disease, unspecified: Secondary | ICD-10-CM | POA: Diagnosis not present

## 2020-10-12 DIAGNOSIS — I2510 Atherosclerotic heart disease of native coronary artery without angina pectoris: Secondary | ICD-10-CM | POA: Diagnosis not present

## 2020-10-12 DIAGNOSIS — I255 Ischemic cardiomyopathy: Secondary | ICD-10-CM | POA: Diagnosis not present

## 2020-10-12 DIAGNOSIS — W19XXXD Unspecified fall, subsequent encounter: Secondary | ICD-10-CM | POA: Diagnosis not present

## 2020-10-12 DIAGNOSIS — I10 Essential (primary) hypertension: Secondary | ICD-10-CM | POA: Diagnosis not present

## 2020-10-13 DIAGNOSIS — R0602 Shortness of breath: Secondary | ICD-10-CM | POA: Diagnosis not present

## 2020-10-13 DIAGNOSIS — I4891 Unspecified atrial fibrillation: Secondary | ICD-10-CM | POA: Diagnosis not present

## 2020-10-13 DIAGNOSIS — M1990 Unspecified osteoarthritis, unspecified site: Secondary | ICD-10-CM | POA: Diagnosis not present

## 2020-10-13 DIAGNOSIS — I110 Hypertensive heart disease with heart failure: Secondary | ICD-10-CM | POA: Diagnosis not present

## 2020-10-13 DIAGNOSIS — J189 Pneumonia, unspecified organism: Secondary | ICD-10-CM | POA: Diagnosis not present

## 2020-10-13 DIAGNOSIS — D7589 Other specified diseases of blood and blood-forming organs: Secondary | ICD-10-CM | POA: Diagnosis not present

## 2020-10-13 DIAGNOSIS — I5023 Acute on chronic systolic (congestive) heart failure: Secondary | ICD-10-CM | POA: Diagnosis not present

## 2020-10-13 DIAGNOSIS — J45909 Unspecified asthma, uncomplicated: Secondary | ICD-10-CM | POA: Diagnosis not present

## 2020-10-13 DIAGNOSIS — R52 Pain, unspecified: Secondary | ICD-10-CM | POA: Diagnosis not present

## 2020-10-13 DIAGNOSIS — R069 Unspecified abnormalities of breathing: Secondary | ICD-10-CM | POA: Diagnosis not present

## 2020-10-13 DIAGNOSIS — I739 Peripheral vascular disease, unspecified: Secondary | ICD-10-CM | POA: Diagnosis not present

## 2020-10-13 DIAGNOSIS — S2241XA Multiple fractures of ribs, right side, initial encounter for closed fracture: Secondary | ICD-10-CM | POA: Diagnosis not present

## 2020-10-13 DIAGNOSIS — I10 Essential (primary) hypertension: Secondary | ICD-10-CM | POA: Diagnosis not present

## 2020-10-13 DIAGNOSIS — I639 Cerebral infarction, unspecified: Secondary | ICD-10-CM | POA: Diagnosis not present

## 2020-10-13 DIAGNOSIS — N390 Urinary tract infection, site not specified: Secondary | ICD-10-CM | POA: Diagnosis not present

## 2020-10-13 DIAGNOSIS — I5022 Chronic systolic (congestive) heart failure: Secondary | ICD-10-CM | POA: Diagnosis not present

## 2020-10-13 DIAGNOSIS — I959 Hypotension, unspecified: Secondary | ICD-10-CM | POA: Diagnosis not present

## 2020-10-13 DIAGNOSIS — I491 Atrial premature depolarization: Secondary | ICD-10-CM | POA: Diagnosis not present

## 2020-10-13 DIAGNOSIS — R918 Other nonspecific abnormal finding of lung field: Secondary | ICD-10-CM | POA: Diagnosis not present

## 2020-10-13 DIAGNOSIS — I2510 Atherosclerotic heart disease of native coronary artery without angina pectoris: Secondary | ICD-10-CM | POA: Diagnosis not present

## 2020-10-13 DIAGNOSIS — Z9181 History of falling: Secondary | ICD-10-CM | POA: Diagnosis not present

## 2020-10-13 DIAGNOSIS — M6281 Muscle weakness (generalized): Secondary | ICD-10-CM | POA: Diagnosis not present

## 2020-10-13 DIAGNOSIS — Z9889 Other specified postprocedural states: Secondary | ICD-10-CM | POA: Diagnosis not present

## 2020-10-13 DIAGNOSIS — E871 Hypo-osmolality and hyponatremia: Secondary | ICD-10-CM | POA: Diagnosis not present

## 2020-10-13 DIAGNOSIS — Z7401 Bed confinement status: Secondary | ICD-10-CM | POA: Diagnosis not present

## 2020-10-13 DIAGNOSIS — S2231XA Fracture of one rib, right side, initial encounter for closed fracture: Secondary | ICD-10-CM | POA: Diagnosis not present

## 2020-10-13 DIAGNOSIS — R404 Transient alteration of awareness: Secondary | ICD-10-CM | POA: Diagnosis not present

## 2020-10-13 DIAGNOSIS — J9602 Acute respiratory failure with hypercapnia: Secondary | ICD-10-CM | POA: Diagnosis not present

## 2020-10-13 DIAGNOSIS — W19XXXD Unspecified fall, subsequent encounter: Secondary | ICD-10-CM | POA: Diagnosis not present

## 2020-10-13 DIAGNOSIS — J90 Pleural effusion, not elsewhere classified: Secondary | ICD-10-CM | POA: Diagnosis not present

## 2020-10-13 DIAGNOSIS — S2241XD Multiple fractures of ribs, right side, subsequent encounter for fracture with routine healing: Secondary | ICD-10-CM | POA: Diagnosis not present

## 2020-10-13 DIAGNOSIS — J9601 Acute respiratory failure with hypoxia: Secondary | ICD-10-CM | POA: Diagnosis not present

## 2020-10-13 DIAGNOSIS — J158 Pneumonia due to other specified bacteria: Secondary | ICD-10-CM | POA: Diagnosis not present

## 2020-10-13 DIAGNOSIS — N1831 Chronic kidney disease, stage 3a: Secondary | ICD-10-CM | POA: Diagnosis not present

## 2020-10-13 DIAGNOSIS — S2249XD Multiple fractures of ribs, unspecified side, subsequent encounter for fracture with routine healing: Secondary | ICD-10-CM | POA: Diagnosis not present

## 2020-10-13 DIAGNOSIS — I5043 Acute on chronic combined systolic (congestive) and diastolic (congestive) heart failure: Secondary | ICD-10-CM | POA: Diagnosis not present

## 2020-10-13 DIAGNOSIS — R0902 Hypoxemia: Secondary | ICD-10-CM | POA: Diagnosis not present

## 2020-10-13 DIAGNOSIS — E785 Hyperlipidemia, unspecified: Secondary | ICD-10-CM | POA: Diagnosis not present

## 2020-10-17 DIAGNOSIS — J90 Pleural effusion, not elsewhere classified: Secondary | ICD-10-CM | POA: Diagnosis not present

## 2020-10-17 DIAGNOSIS — J189 Pneumonia, unspecified organism: Secondary | ICD-10-CM | POA: Diagnosis not present

## 2020-10-17 DIAGNOSIS — S2231XA Fracture of one rib, right side, initial encounter for closed fracture: Secondary | ICD-10-CM | POA: Diagnosis not present

## 2020-10-19 DIAGNOSIS — R0602 Shortness of breath: Secondary | ICD-10-CM | POA: Diagnosis not present

## 2020-10-19 DIAGNOSIS — I739 Peripheral vascular disease, unspecified: Secondary | ICD-10-CM | POA: Diagnosis not present

## 2020-10-19 DIAGNOSIS — I5022 Chronic systolic (congestive) heart failure: Secondary | ICD-10-CM | POA: Diagnosis not present

## 2020-10-19 DIAGNOSIS — N1831 Chronic kidney disease, stage 3a: Secondary | ICD-10-CM | POA: Diagnosis not present

## 2020-10-19 DIAGNOSIS — I639 Cerebral infarction, unspecified: Secondary | ICD-10-CM | POA: Diagnosis not present

## 2020-10-20 DIAGNOSIS — M1990 Unspecified osteoarthritis, unspecified site: Secondary | ICD-10-CM | POA: Diagnosis not present

## 2020-10-20 DIAGNOSIS — Z85828 Personal history of other malignant neoplasm of skin: Secondary | ICD-10-CM | POA: Diagnosis not present

## 2020-10-20 DIAGNOSIS — I499 Cardiac arrhythmia, unspecified: Secondary | ICD-10-CM | POA: Diagnosis not present

## 2020-10-20 DIAGNOSIS — E782 Mixed hyperlipidemia: Secondary | ICD-10-CM | POA: Diagnosis present

## 2020-10-20 DIAGNOSIS — I110 Hypertensive heart disease with heart failure: Secondary | ICD-10-CM | POA: Diagnosis not present

## 2020-10-20 DIAGNOSIS — S2249XD Multiple fractures of ribs, unspecified side, subsequent encounter for fracture with routine healing: Secondary | ICD-10-CM | POA: Diagnosis not present

## 2020-10-20 DIAGNOSIS — J189 Pneumonia, unspecified organism: Secondary | ICD-10-CM | POA: Diagnosis not present

## 2020-10-20 DIAGNOSIS — N390 Urinary tract infection, site not specified: Secondary | ICD-10-CM | POA: Diagnosis not present

## 2020-10-20 DIAGNOSIS — I248 Other forms of acute ischemic heart disease: Secondary | ICD-10-CM | POA: Diagnosis present

## 2020-10-20 DIAGNOSIS — R404 Transient alteration of awareness: Secondary | ICD-10-CM | POA: Diagnosis not present

## 2020-10-20 DIAGNOSIS — I2510 Atherosclerotic heart disease of native coronary artery without angina pectoris: Secondary | ICD-10-CM | POA: Diagnosis not present

## 2020-10-20 DIAGNOSIS — Z66 Do not resuscitate: Secondary | ICD-10-CM | POA: Diagnosis not present

## 2020-10-20 DIAGNOSIS — Z8673 Personal history of transient ischemic attack (TIA), and cerebral infarction without residual deficits: Secondary | ICD-10-CM | POA: Diagnosis not present

## 2020-10-20 DIAGNOSIS — I10 Essential (primary) hypertension: Secondary | ICD-10-CM | POA: Diagnosis not present

## 2020-10-20 DIAGNOSIS — G9341 Metabolic encephalopathy: Secondary | ICD-10-CM | POA: Diagnosis not present

## 2020-10-20 DIAGNOSIS — I5023 Acute on chronic systolic (congestive) heart failure: Secondary | ICD-10-CM | POA: Diagnosis not present

## 2020-10-20 DIAGNOSIS — I491 Atrial premature depolarization: Secondary | ICD-10-CM | POA: Diagnosis not present

## 2020-10-20 DIAGNOSIS — N400 Enlarged prostate without lower urinary tract symptoms: Secondary | ICD-10-CM | POA: Diagnosis not present

## 2020-10-20 DIAGNOSIS — Z20822 Contact with and (suspected) exposure to covid-19: Secondary | ICD-10-CM | POA: Diagnosis not present

## 2020-10-20 DIAGNOSIS — J9601 Acute respiratory failure with hypoxia: Secondary | ICD-10-CM | POA: Diagnosis not present

## 2020-10-20 DIAGNOSIS — S2241XD Multiple fractures of ribs, right side, subsequent encounter for fracture with routine healing: Secondary | ICD-10-CM | POA: Diagnosis not present

## 2020-10-20 DIAGNOSIS — J90 Pleural effusion, not elsewhere classified: Secondary | ICD-10-CM | POA: Diagnosis not present

## 2020-10-20 DIAGNOSIS — I509 Heart failure, unspecified: Secondary | ICD-10-CM | POA: Diagnosis not present

## 2020-10-20 DIAGNOSIS — R52 Pain, unspecified: Secondary | ICD-10-CM | POA: Diagnosis not present

## 2020-10-20 DIAGNOSIS — Z7901 Long term (current) use of anticoagulants: Secondary | ICD-10-CM | POA: Diagnosis not present

## 2020-10-20 DIAGNOSIS — I739 Peripheral vascular disease, unspecified: Secondary | ICD-10-CM | POA: Diagnosis not present

## 2020-10-20 DIAGNOSIS — R319 Hematuria, unspecified: Secondary | ICD-10-CM | POA: Diagnosis not present

## 2020-10-20 DIAGNOSIS — Z7401 Bed confinement status: Secondary | ICD-10-CM | POA: Diagnosis not present

## 2020-10-20 DIAGNOSIS — R778 Other specified abnormalities of plasma proteins: Secondary | ICD-10-CM | POA: Diagnosis not present

## 2020-10-20 DIAGNOSIS — M6281 Muscle weakness (generalized): Secondary | ICD-10-CM | POA: Diagnosis not present

## 2020-10-20 DIAGNOSIS — J80 Acute respiratory distress syndrome: Secondary | ICD-10-CM | POA: Diagnosis not present

## 2020-10-20 DIAGNOSIS — I252 Old myocardial infarction: Secondary | ICD-10-CM | POA: Diagnosis not present

## 2020-10-20 DIAGNOSIS — Z515 Encounter for palliative care: Secondary | ICD-10-CM | POA: Diagnosis not present

## 2020-10-20 DIAGNOSIS — I480 Paroxysmal atrial fibrillation: Secondary | ICD-10-CM | POA: Diagnosis not present

## 2020-10-20 DIAGNOSIS — E669 Obesity, unspecified: Secondary | ICD-10-CM | POA: Diagnosis not present

## 2020-10-20 DIAGNOSIS — I959 Hypotension, unspecified: Secondary | ICD-10-CM | POA: Diagnosis not present

## 2020-10-20 DIAGNOSIS — R0602 Shortness of breath: Secondary | ICD-10-CM | POA: Diagnosis present

## 2020-10-20 DIAGNOSIS — D72829 Elevated white blood cell count, unspecified: Secondary | ICD-10-CM | POA: Diagnosis not present

## 2020-10-20 DIAGNOSIS — Z87440 Personal history of urinary (tract) infections: Secondary | ICD-10-CM | POA: Diagnosis not present

## 2020-10-20 DIAGNOSIS — Z87891 Personal history of nicotine dependence: Secondary | ICD-10-CM | POA: Diagnosis not present

## 2020-10-20 DIAGNOSIS — J9621 Acute and chronic respiratory failure with hypoxia: Secondary | ICD-10-CM | POA: Diagnosis not present

## 2020-10-20 DIAGNOSIS — I5043 Acute on chronic combined systolic (congestive) and diastolic (congestive) heart failure: Secondary | ICD-10-CM | POA: Diagnosis not present

## 2020-10-20 DIAGNOSIS — Z9181 History of falling: Secondary | ICD-10-CM | POA: Diagnosis not present

## 2020-10-20 DIAGNOSIS — I5020 Unspecified systolic (congestive) heart failure: Secondary | ICD-10-CM | POA: Diagnosis not present

## 2020-10-20 DIAGNOSIS — Z8701 Personal history of pneumonia (recurrent): Secondary | ICD-10-CM | POA: Diagnosis not present

## 2020-10-20 DIAGNOSIS — J9622 Acute and chronic respiratory failure with hypercapnia: Secondary | ICD-10-CM | POA: Diagnosis not present

## 2020-10-20 DIAGNOSIS — Z7189 Other specified counseling: Secondary | ICD-10-CM | POA: Diagnosis not present

## 2020-10-20 DIAGNOSIS — I4891 Unspecified atrial fibrillation: Secondary | ICD-10-CM | POA: Diagnosis not present

## 2020-10-20 DIAGNOSIS — Z6835 Body mass index (BMI) 35.0-35.9, adult: Secondary | ICD-10-CM | POA: Diagnosis not present

## 2020-10-20 DIAGNOSIS — Z8249 Family history of ischemic heart disease and other diseases of the circulatory system: Secondary | ICD-10-CM | POA: Diagnosis not present

## 2020-10-20 DIAGNOSIS — R739 Hyperglycemia, unspecified: Secondary | ICD-10-CM | POA: Diagnosis present

## 2020-10-20 DIAGNOSIS — I255 Ischemic cardiomyopathy: Secondary | ICD-10-CM | POA: Diagnosis not present

## 2020-10-21 DIAGNOSIS — N390 Urinary tract infection, site not specified: Secondary | ICD-10-CM | POA: Diagnosis not present

## 2020-10-21 DIAGNOSIS — M1990 Unspecified osteoarthritis, unspecified site: Secondary | ICD-10-CM | POA: Diagnosis not present

## 2020-10-21 DIAGNOSIS — J189 Pneumonia, unspecified organism: Secondary | ICD-10-CM | POA: Diagnosis not present

## 2020-10-21 DIAGNOSIS — I480 Paroxysmal atrial fibrillation: Secondary | ICD-10-CM | POA: Diagnosis not present

## 2020-10-21 DIAGNOSIS — I255 Ischemic cardiomyopathy: Secondary | ICD-10-CM | POA: Diagnosis not present

## 2020-10-21 DIAGNOSIS — N400 Enlarged prostate without lower urinary tract symptoms: Secondary | ICD-10-CM | POA: Diagnosis not present

## 2020-10-21 DIAGNOSIS — R319 Hematuria, unspecified: Secondary | ICD-10-CM | POA: Diagnosis not present

## 2020-10-21 DIAGNOSIS — S2241XD Multiple fractures of ribs, right side, subsequent encounter for fracture with routine healing: Secondary | ICD-10-CM | POA: Diagnosis not present

## 2020-10-23 ENCOUNTER — Inpatient Hospital Stay (HOSPITAL_COMMUNITY)
Admission: EM | Admit: 2020-10-23 | Discharge: 2020-10-28 | DRG: 291 | Disposition: A | Discharge status: Skilled Nursing Facility | Payer: Medicare HMO | Attending: Internal Medicine | Admitting: Internal Medicine

## 2020-10-23 ENCOUNTER — Encounter (HOSPITAL_COMMUNITY): Payer: Self-pay | Admitting: Emergency Medicine

## 2020-10-23 ENCOUNTER — Emergency Department (HOSPITAL_COMMUNITY): Payer: Medicare HMO

## 2020-10-23 ENCOUNTER — Other Ambulatory Visit: Payer: Self-pay

## 2020-10-23 DIAGNOSIS — I639 Cerebral infarction, unspecified: Secondary | ICD-10-CM | POA: Diagnosis not present

## 2020-10-23 DIAGNOSIS — J9621 Acute and chronic respiratory failure with hypoxia: Secondary | ICD-10-CM | POA: Diagnosis present

## 2020-10-23 DIAGNOSIS — M6281 Muscle weakness (generalized): Secondary | ICD-10-CM | POA: Diagnosis not present

## 2020-10-23 DIAGNOSIS — R52 Pain, unspecified: Secondary | ICD-10-CM | POA: Diagnosis not present

## 2020-10-23 DIAGNOSIS — Z87891 Personal history of nicotine dependence: Secondary | ICD-10-CM

## 2020-10-23 DIAGNOSIS — R0602 Shortness of breath: Secondary | ICD-10-CM | POA: Diagnosis not present

## 2020-10-23 DIAGNOSIS — D72829 Elevated white blood cell count, unspecified: Secondary | ICD-10-CM | POA: Diagnosis present

## 2020-10-23 DIAGNOSIS — Z6835 Body mass index (BMI) 35.0-35.9, adult: Secondary | ICD-10-CM | POA: Diagnosis not present

## 2020-10-23 DIAGNOSIS — G9341 Metabolic encephalopathy: Secondary | ICD-10-CM | POA: Diagnosis present

## 2020-10-23 DIAGNOSIS — R404 Transient alteration of awareness: Secondary | ICD-10-CM | POA: Diagnosis not present

## 2020-10-23 DIAGNOSIS — I5020 Unspecified systolic (congestive) heart failure: Secondary | ICD-10-CM | POA: Diagnosis not present

## 2020-10-23 DIAGNOSIS — Z87440 Personal history of urinary (tract) infections: Secondary | ICD-10-CM

## 2020-10-23 DIAGNOSIS — R2681 Unsteadiness on feet: Secondary | ICD-10-CM | POA: Diagnosis not present

## 2020-10-23 DIAGNOSIS — I110 Hypertensive heart disease with heart failure: Principal | ICD-10-CM | POA: Diagnosis present

## 2020-10-23 DIAGNOSIS — R739 Hyperglycemia, unspecified: Secondary | ICD-10-CM | POA: Diagnosis present

## 2020-10-23 DIAGNOSIS — R778 Other specified abnormalities of plasma proteins: Secondary | ICD-10-CM | POA: Diagnosis not present

## 2020-10-23 DIAGNOSIS — Z515 Encounter for palliative care: Secondary | ICD-10-CM | POA: Diagnosis not present

## 2020-10-23 DIAGNOSIS — Z8673 Personal history of transient ischemic attack (TIA), and cerebral infarction without residual deficits: Secondary | ICD-10-CM

## 2020-10-23 DIAGNOSIS — Z20822 Contact with and (suspected) exposure to covid-19: Secondary | ICD-10-CM | POA: Diagnosis present

## 2020-10-23 DIAGNOSIS — J90 Pleural effusion, not elsewhere classified: Secondary | ICD-10-CM | POA: Diagnosis not present

## 2020-10-23 DIAGNOSIS — J80 Acute respiratory distress syndrome: Secondary | ICD-10-CM | POA: Diagnosis not present

## 2020-10-23 DIAGNOSIS — I5023 Acute on chronic systolic (congestive) heart failure: Secondary | ICD-10-CM

## 2020-10-23 DIAGNOSIS — E669 Obesity, unspecified: Secondary | ICD-10-CM | POA: Diagnosis present

## 2020-10-23 DIAGNOSIS — Z85828 Personal history of other malignant neoplasm of skin: Secondary | ICD-10-CM | POA: Diagnosis not present

## 2020-10-23 DIAGNOSIS — Z86718 Personal history of other venous thrombosis and embolism: Secondary | ICD-10-CM

## 2020-10-23 DIAGNOSIS — Z8701 Personal history of pneumonia (recurrent): Secondary | ICD-10-CM | POA: Diagnosis not present

## 2020-10-23 DIAGNOSIS — I255 Ischemic cardiomyopathy: Secondary | ICD-10-CM | POA: Diagnosis present

## 2020-10-23 DIAGNOSIS — I2510 Atherosclerotic heart disease of native coronary artery without angina pectoris: Secondary | ICD-10-CM | POA: Diagnosis present

## 2020-10-23 DIAGNOSIS — I5043 Acute on chronic combined systolic (congestive) and diastolic (congestive) heart failure: Secondary | ICD-10-CM | POA: Diagnosis not present

## 2020-10-23 DIAGNOSIS — L509 Urticaria, unspecified: Secondary | ICD-10-CM | POA: Diagnosis not present

## 2020-10-23 DIAGNOSIS — I509 Heart failure, unspecified: Principal | ICD-10-CM

## 2020-10-23 DIAGNOSIS — Z66 Do not resuscitate: Secondary | ICD-10-CM | POA: Diagnosis not present

## 2020-10-23 DIAGNOSIS — J9622 Acute and chronic respiratory failure with hypercapnia: Secondary | ICD-10-CM | POA: Diagnosis not present

## 2020-10-23 DIAGNOSIS — J9601 Acute respiratory failure with hypoxia: Secondary | ICD-10-CM | POA: Diagnosis not present

## 2020-10-23 DIAGNOSIS — I4891 Unspecified atrial fibrillation: Secondary | ICD-10-CM | POA: Diagnosis not present

## 2020-10-23 DIAGNOSIS — Z9181 History of falling: Secondary | ICD-10-CM

## 2020-10-23 DIAGNOSIS — Z8249 Family history of ischemic heart disease and other diseases of the circulatory system: Secondary | ICD-10-CM

## 2020-10-23 DIAGNOSIS — I252 Old myocardial infarction: Secondary | ICD-10-CM

## 2020-10-23 DIAGNOSIS — Z96652 Presence of left artificial knee joint: Secondary | ICD-10-CM | POA: Diagnosis present

## 2020-10-23 DIAGNOSIS — I739 Peripheral vascular disease, unspecified: Secondary | ICD-10-CM | POA: Diagnosis not present

## 2020-10-23 DIAGNOSIS — Z7901 Long term (current) use of anticoagulants: Secondary | ICD-10-CM

## 2020-10-23 DIAGNOSIS — Z79899 Other long term (current) drug therapy: Secondary | ICD-10-CM

## 2020-10-23 DIAGNOSIS — I491 Atrial premature depolarization: Secondary | ICD-10-CM | POA: Diagnosis not present

## 2020-10-23 DIAGNOSIS — I248 Other forms of acute ischemic heart disease: Secondary | ICD-10-CM | POA: Diagnosis present

## 2020-10-23 DIAGNOSIS — Z7401 Bed confinement status: Secondary | ICD-10-CM | POA: Diagnosis not present

## 2020-10-23 DIAGNOSIS — E782 Mixed hyperlipidemia: Secondary | ICD-10-CM | POA: Diagnosis present

## 2020-10-23 DIAGNOSIS — R279 Unspecified lack of coordination: Secondary | ICD-10-CM | POA: Diagnosis not present

## 2020-10-23 DIAGNOSIS — R5381 Other malaise: Secondary | ICD-10-CM | POA: Diagnosis not present

## 2020-10-23 DIAGNOSIS — N179 Acute kidney failure, unspecified: Secondary | ICD-10-CM | POA: Diagnosis not present

## 2020-10-23 DIAGNOSIS — I959 Hypotension, unspecified: Secondary | ICD-10-CM | POA: Diagnosis not present

## 2020-10-23 DIAGNOSIS — I499 Cardiac arrhythmia, unspecified: Secondary | ICD-10-CM | POA: Diagnosis not present

## 2020-10-23 DIAGNOSIS — Z951 Presence of aortocoronary bypass graft: Secondary | ICD-10-CM

## 2020-10-23 DIAGNOSIS — Z7189 Other specified counseling: Secondary | ICD-10-CM | POA: Diagnosis not present

## 2020-10-23 DIAGNOSIS — I5032 Chronic diastolic (congestive) heart failure: Secondary | ICD-10-CM | POA: Diagnosis not present

## 2020-10-23 DIAGNOSIS — I480 Paroxysmal atrial fibrillation: Secondary | ICD-10-CM | POA: Diagnosis present

## 2020-10-23 DIAGNOSIS — I517 Cardiomegaly: Secondary | ICD-10-CM | POA: Diagnosis not present

## 2020-10-23 DIAGNOSIS — R531 Weakness: Secondary | ICD-10-CM | POA: Diagnosis not present

## 2020-10-23 LAB — COMPREHENSIVE METABOLIC PANEL
ALT: 31 U/L (ref 0–44)
AST: 18 U/L (ref 15–41)
Albumin: 3.8 g/dL (ref 3.5–5.0)
Alkaline Phosphatase: 105 U/L (ref 38–126)
Anion gap: 7 (ref 5–15)
BUN: 24 mg/dL — ABNORMAL HIGH (ref 8–23)
CO2: 35 mmol/L — ABNORMAL HIGH (ref 22–32)
Calcium: 8.8 mg/dL — ABNORMAL LOW (ref 8.9–10.3)
Chloride: 97 mmol/L — ABNORMAL LOW (ref 98–111)
Creatinine, Ser: 1.02 mg/dL (ref 0.61–1.24)
GFR, Estimated: >60 mL/min (ref >60)
Glucose, Bld: 238 mg/dL — ABNORMAL HIGH (ref 70–99)
Potassium: 4.5 mmol/L (ref 3.5–5.1)
Sodium: 139 mmol/L (ref 135–145)
Total Bilirubin: 0.3 mg/dL (ref 0.3–1.2)
Total Protein: 7.1 g/dL (ref 6.5–8.1)

## 2020-10-23 LAB — BLOOD GAS, ARTERIAL
Acid-Base Excess: 8.8 mmol/L — ABNORMAL HIGH (ref 0.0–2.0)
Bicarbonate: 30.3 mmol/L — ABNORMAL HIGH (ref 20.0–28.0)
FIO2: 40
O2 Saturation: 85.8 %
Patient temperature: 37
pCO2 arterial: 82.4 mmHg (ref 32.0–48.0)
pH, Arterial: 7.26 — ABNORMAL LOW (ref 7.350–7.450)
pO2, Arterial: 56.4 mmHg — ABNORMAL LOW (ref 83.0–108.0)

## 2020-10-23 LAB — URINALYSIS, ROUTINE W REFLEX MICROSCOPIC
Bilirubin Urine: NEGATIVE
Glucose, UA: NEGATIVE mg/dL
Hgb urine dipstick: NEGATIVE
Ketones, ur: NEGATIVE mg/dL
Nitrite: NEGATIVE
Protein, ur: NEGATIVE mg/dL
Specific Gravity, Urine: 1.012 (ref 1.005–1.030)
pH: 5 (ref 5.0–8.0)

## 2020-10-23 LAB — CBC WITH DIFFERENTIAL/PLATELET
Abs Immature Granulocytes: 0.47 10*3/uL — ABNORMAL HIGH (ref 0.00–0.07)
Basophils Absolute: 0.2 10*3/uL — ABNORMAL HIGH (ref 0.0–0.1)
Basophils Relative: 1 %
Eosinophils Absolute: 0.2 10*3/uL (ref 0.0–0.5)
Eosinophils Relative: 1 %
HCT: 37.4 % — ABNORMAL LOW (ref 39.0–52.0)
Hemoglobin: 11.9 g/dL — ABNORMAL LOW (ref 13.0–17.0)
Immature Granulocytes: 3 %
Lymphocytes Relative: 4 %
Lymphs Abs: 0.7 10*3/uL (ref 0.7–4.0)
MCH: 31.2 pg (ref 26.0–34.0)
MCHC: 31.8 g/dL (ref 30.0–36.0)
MCV: 98.2 fL (ref 80.0–100.0)
Monocytes Absolute: 0.7 10*3/uL (ref 0.1–1.0)
Monocytes Relative: 4 %
Neutro Abs: 15.2 10*3/uL — ABNORMAL HIGH (ref 1.7–7.7)
Neutrophils Relative %: 87 %
Platelets: 301 10*3/uL (ref 150–400)
RBC: 3.81 MIL/uL — ABNORMAL LOW (ref 4.22–5.81)
RDW: 12.9 % (ref 11.5–15.5)
WBC: 17.4 10*3/uL — ABNORMAL HIGH (ref 4.0–10.5)
nRBC: 0 % (ref 0.0–0.2)

## 2020-10-23 LAB — BRAIN NATRIURETIC PEPTIDE: B Natriuretic Peptide: 386 pg/mL — ABNORMAL HIGH (ref 0.0–100.0)

## 2020-10-23 LAB — RESP PANEL BY RT-PCR (FLU A&B, COVID) ARPGX2
Influenza A by PCR: NEGATIVE
Influenza B by PCR: NEGATIVE
SARS Coronavirus 2 by RT PCR: NEGATIVE

## 2020-10-23 LAB — PROTIME-INR
INR: 1.1 (ref 0.8–1.2)
Prothrombin Time: 14.3 seconds (ref 11.4–15.2)

## 2020-10-23 LAB — TROPONIN I (HIGH SENSITIVITY): Troponin I (High Sensitivity): 34 ng/L — ABNORMAL HIGH (ref <18)

## 2020-10-23 MED ORDER — SODIUM CHLORIDE 0.9 % IV SOLN
1.0000 g | Freq: Once | INTRAVENOUS | Status: AC
  Administered 2020-10-23: 1 g via INTRAVENOUS
  Filled 2020-10-23 (×2): qty 1

## 2020-10-23 MED ORDER — VANCOMYCIN HCL 2000 MG/400ML IV SOLN
2000.0000 mg | Freq: Once | INTRAVENOUS | Status: AC
  Administered 2020-10-23: 2000 mg via INTRAVENOUS
  Filled 2020-10-23: qty 400

## 2020-10-23 MED ORDER — FUROSEMIDE 10 MG/ML IJ SOLN
40.0000 mg | Freq: Once | INTRAMUSCULAR | Status: AC
  Administered 2020-10-23: 40 mg via INTRAVENOUS
  Filled 2020-10-23: qty 4

## 2020-10-23 NOTE — Progress Notes (Addendum)
Pharmacy Antibiotic Note  Benjamin Cordova is a 86 y.o. male admitted on 10/23/2020 with sepsis.  Pharmacy has been consulted for vancomycin and cefepime dosing.  Plan: Vancomycin 2gm IV x 1 then 1750 mg IV q24 hours Cefepime 2gm IV q8 hours F/u renal function, cultures and clinical course  Height: '5\' 8"'$  (172.7 cm) Weight: 97.5 kg (214 lb 15.2 oz) IBW/kg (Calculated) : 68.4  Temp (24hrs), Avg:98 F (36.7 C), Min:98 F (36.7 C), Max:98 F (36.7 C)  Recent Labs  Lab 10/23/20 2129  WBC 17.4*  CREATININE 1.02    Estimated Creatinine Clearance: 62.1 mL/min (by C-G formula based on SCr of 1.02 mg/dL).    No Known Allergies   Thank you for allowing pharmacy to be a part of this patient's care.  Excell Seltzer Poteet 10/23/2020 10:11 PM

## 2020-10-23 NOTE — ED Triage Notes (Signed)
Pt brought in by [facility] from [place]. Pt found in resp distress 38% on RA. Pt arrives on CPAP with EMS.

## 2020-10-23 NOTE — ED Notes (Signed)
Date and time results received: 10/23/20 2238  Test: CO2 Critical Value: 82.4  Name of Provider Notified: Roderic Palau, MD  Orders Received? Or Actions Taken?: acknowledged

## 2020-10-23 NOTE — ED Provider Notes (Signed)
[facility] EMERGENCY DEPARTMENT Provider Note   CSN: [id number] Arrival date & time: 10/23/20  2118     History Chief Complaint  Patient presents with   Respiratory Distress    Benjamin Cordova is a 86 y.o. male.  Patient has a history of congestive heart failure.  He became extremely short of breath and was brought in by EMS.  His O2 sats initially by EMS was 38%.  He is a DNR/DNI.  Patient was placed on BiPAP by EMS and improved  The history is provided by the EMS personnel and medical records. No language interpreter was used.  Shortness of Breath Severity:  Severe Onset quality:  Sudden Duration:  40 minutes Timing:  Constant Progression:  Waxing and waning Chronicity:  Recurrent Context: activity   Relieved by:  Nothing Worsened by:  Nothing Ineffective treatments:  None tried     Past Medical History:  Diagnosis Date   Arthritis    Coronary artery disease    a. s/p CABG in 1981 b. 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89   Affected right leg   Skin cancer    TIA (transient ischemic attack) 1989   Chronic coumadin   UTI (lower urinary tract infection)     Patient Active Problem List   Diagnosis Date Noted   CHF exacerbation ([place]) 10/23/2020   Hypotension 10/06/2020   Multiple rib fractures 10/05/2020   Uncontrolled pain 10/05/2020   AKI (acute kidney injury) ([place])    Weakness 06/30/2019   Dyspnea 06/30/2019   Subtherapeutic anticoagulation    Acute on chronic respiratory failure with hypoxia and hypercapnia (HCC)    Acute on chronic systolic HF  (heart failure) (HCC)    LV (left ventricular) mural thrombus [id number]   Systolic and diastolic CHF, acute on chronic (HCC)    Hypokalemia    CHF (congestive heart failure) ([place]) 09/01/2015   Respiratory failure with hypoxia ([place]) 08/31/2015   HCAP (healthcare-associated pneumonia) 08/31/2015   Acute respiratory failure with hypoxia and hypercapnia ([place])    Encounter for central line placement    Knee swelling    Subtherapeutic international normalized ratio (INR) 08/08/2015   Constipation 08/08/2015   Hyperlipidemia 08/08/2015   Blurred vision 07/29/2015   Anemia 07/29/2015   S/P total knee replacement 07/27/2015   S/P CABG x 2 10/08/2014   Chest pain 10/01/2014   Elevated INR 10/01/2014   Leukocytosis 10/01/2014   Thrombocytopenia ([place]) 10/01/2014   Chest pain at rest 10/01/2014   Unstable angina ([place]) 10/01/2014   Coagulopathy ([place]) 10/01/2014   Paroxysmal atrial fibrillation ([place]) 10/01/2014   Coronary artery disease    Hypertension    Arthritis    Peripheral vascular disease ([place])    Essential hypertension     Past Surgical History:  Procedure Laterality Date   CARDIAC CATHETERIZATION N/A 10/05/2014   Procedure: Left Heart Cath and Coronary Angiography;  Surgeon: Peter M Martinique, MD;  Location: [facility];  Service: Cardiovascular;  Laterality: N/A;   CORONARY ARTERY BYPASS  GRAFT  1981   [facility]   CORONARY ARTERY BYPASS GRAFT N/A 10/08/2014   Procedure: REDO CORONARY ARTERY BYPASS GRAFTING (CABG) Times Two Grafts using Left Internal Mammary and Left Radial Artery;  Surgeon: Melrose Nakayama, MD;  Location: [facility];  Service: Open Heart Surgery;  Laterality: N/A;   CYSTOSCOPY WITH INJECTION  10/10/2010   Procedure: CYSTOSCOPY WITH INJECTION;  Surgeon: Marissa Nestle;  Location: [facility];  Service: Urology;  Laterality: N/A;  with retrograde urethrogram   FOOT SURGERY Right 1954   [facility], muscle implantation   JOINT REPLACEMENT     RADIAL ARTERY HARVEST  Left 10/08/2014   Procedure: Left RADIAL ARTERY HARVEST;  Surgeon: Melrose Nakayama, MD;  Location: [facility];  Service: Open Heart Surgery;  Laterality: Left;   TEE WITHOUT CARDIOVERSION N/A 10/08/2014   Procedure: TRANSESOPHAGEAL ECHOCARDIOGRAM (TEE);  Surgeon: Melrose Nakayama, MD;  Location: [facility];  Service: Open Heart Surgery;  Laterality: N/A;   TONSILLECTOMY  1949   [facility]   TOTAL KNEE ARTHROPLASTY Left 07/27/2015   Procedure: TOTAL LEFT KNEE ARTHROPLASTY;  Surgeon: [name], MD;  Location: [place];  Service: Orthopedics;  Laterality: Left;       Family History  Problem Relation Age of Onset   Heart attack Mother    Diabetes Mother    Heart attack Father    Heart attack Brother    Anesthesia problems Neg Hx    Hypotension Neg Hx    Malignant hyperthermia Neg Hx    Pseudochol deficiency Neg Hx     Social History   Tobacco Use   Smoking status: Former    Packs/day: 1.00    Years: 28.00    Pack years: 28.00    Types: Cigarettes    Quit date: 09/22/1978    Years since quitting: 42.1   Smokeless tobacco: Never  Vaping Use   Vaping Use: Never used  Substance Use Topics   Alcohol use: No    Alcohol/week: 0.0 standard drinks   Drug use: No    Home Medications Prior to Admission medications   Medication Sig Start Date End Date Taking? Authorizing Provider  acetaminophen (TYLENOL) 325 MG tablet Take 2 tablets (650 mg total) by mouth every 6 (six) hours. 10/11/20   Johnson, Clanford L, MD  albuterol (PROVENTIL) (2.5 MG/3ML) 0.083% nebulizer solution Take 2.5 mg by nebulization every 6 (six) hours as needed for wheezing or shortness of breath.  08/29/19   [provider]  ascorbic acid (VITAMIN C) 500 MG tablet Take 1,000 mg by mouth 2 (two) times daily.    [provider]  diphenhydrAMINE (BENADRYL) 50 MG capsule Take 1 capsule (50 mg total) by mouth at bedtime as needed for sleep. 10/11/20   Johnson, Clanford L, MD  finasteride (PROSCAR) 5 MG tablet Take  5 mg by mouth daily. 04/20/19   [provider]  folic acid (FOLVITE) [id number] MCG tablet Take 400 mcg by mouth daily.    [provider]  furosemide (LASIX) 40 MG tablet Take 1 tablet (40 mg total) by mouth daily. 10/11/20   Johnson, Clanford L, MD  metoprolol succinate (TOPROL-XL) 25 MG 24 hr tablet Take 0.5 tablets (12.5 mg total) by mouth daily. 10/12/20   Johnson, Clanford L, MD  Multiple Vitamins-Minerals (CERTAVITE/ANTIOXIDANTS) TABS Take 1 tablet by mouth daily.     [provider]  oxyCODONE (OXY IR/ROXICODONE) 5 MG immediate release tablet Take 1 tablet (5 mg total) by mouth every 6 (  six) hours as needed for severe pain. 10/11/20   Johnson, Clanford L, MD  potassium chloride (K-DUR) 10 MEQ tablet Take 10 mEq by mouth daily. Take with Lasix (Furosemide) 03/18/18   [provider]  senna-docusate (SENOKOT-S) 8.6-50 MG tablet Take 1 tablet by mouth at bedtime. 10/11/20   Johnson, Clanford L, MD  simvastatin (ZOCOR) 40 MG tablet Take 40 mg by mouth at bedtime.     [provider]  spironolactone (ALDACTONE) 25 MG tablet Take 0.5 tablets (12.5 mg total) by mouth daily. 11/10/19 10/05/20  Ahmed Prima, Fransisco Hertz, PA-C  tamsulosin (FLOMAX) 0.4 MG CAPS capsule Take 0.4 mg by mouth in the morning.  12/02/18   [provider]  warfarin (COUMADIN) 5 MG tablet Take 1.5 tablets (7.5 mg total) by mouth daily at 6 PM. Take 10 mg every night except Friday take 5 mg 10/11/20   Murlean Iba, MD    Allergies    Patient has no known allergies.  Review of Systems   Review of Systems  Unable to perform ROS: Severe respiratory distress  Respiratory:  Positive for shortness of breath.    Physical Exam Updated Vital Signs BP 124/71   Pulse (!) 107   Temp 98 F (36.7 C) (Rectal)   Resp (!) 27   Ht '5\' 8"'$  (1.727 m)   Wt 97.5 kg   SpO2 99%   BMI 32.68 kg/m   Physical Exam Vitals and nursing note reviewed.  Constitutional:      Appearance: He is  well-developed.     Comments: Lethargic  HENT:     Head: Normocephalic.     Nose: Nose normal.     Mouth/Throat:     Mouth: Mucous membranes are moist.  Eyes:     General: No scleral icterus.    Conjunctiva/sclera: Conjunctivae normal.  Neck:     Thyroid: No thyromegaly.  Cardiovascular:     Rate and Rhythm: Regular rhythm. Tachycardia present.     Heart sounds: No murmur heard.   No friction rub. No gallop.  Pulmonary:     Effort: Respiratory distress present.     Breath sounds: No stridor. No wheezing or rales.  Chest:     Chest wall: No tenderness.  Abdominal:     General: There is no distension.     Tenderness: There is no abdominal tenderness. There is no rebound.  Musculoskeletal:        General: Normal range of motion.     Cervical back: Neck supple.  Lymphadenopathy:     Cervical: No cervical adenopathy.  Skin:    Findings: No erythema or rash.  Neurological:     Motor: No abnormal muscle tone.     Coordination: Coordination normal.     Comments: Patient will answer questions slowly and follow commands    ED Results / Procedures / Treatments   Labs (all labs ordered are listed, but only abnormal results are displayed) Labs Reviewed  CBC WITH DIFFERENTIAL/PLATELET - Abnormal; Notable for the following components:      Result Value   WBC 17.4 (*)    RBC 3.81 (*)    Hemoglobin 11.9 (*)    HCT 37.4 (*)    Neutro Abs 15.2 (*)    Basophils Absolute 0.2 (*)    Abs Immature Granulocytes 0.47 (*)    All other components within normal limits  COMPREHENSIVE METABOLIC PANEL - Abnormal; Notable for the following components:   Chloride 97 (*)    CO2 35 (*)  Glucose, Bld 238 (*)    BUN 24 (*)    Calcium 8.8 (*)    All other components within normal limits  BRAIN NATRIURETIC PEPTIDE - Abnormal; Notable for the following components:   B Natriuretic Peptide 386.0 (*)    All other components within normal limits  URINALYSIS, ROUTINE W REFLEX MICROSCOPIC - Abnormal;  Notable for the following components:   Leukocytes,Ua TRACE (*)    Bacteria, UA RARE (*)    All other components within normal limits  BLOOD GAS, ARTERIAL - Abnormal; Notable for the following components:   pH, Arterial 7.260 (*)    pCO2 arterial 82.4 (*)    pO2, Arterial 56.4 (*)    Bicarbonate 30.3 (*)    Acid-Base Excess 8.8 (*)    All other components within normal limits  TROPONIN I (HIGH SENSITIVITY) - Abnormal; Notable for the following components:   Troponin I (High Sensitivity) 34 (*)    All other components within normal limits  RESP PANEL BY RT-PCR (FLU A&B, COVID) [id number]  CULTURE, BLOOD (SINGLE)  URINE CULTURE  CULTURE, BLOOD (ROUTINE X 2)  CULTURE, BLOOD (ROUTINE X 2)  PROTIME-INR  LACTIC ACID, PLASMA  LACTIC ACID, PLASMA  APTT  PROCALCITONIN  LACTIC ACID, PLASMA  LACTIC ACID, PLASMA  TROPONIN I (HIGH SENSITIVITY)    EKG EKG Interpretation  Date/Time:  Sunday October 23 2020 21:29:15 EDT Ventricular Rate:  113 PR Interval:  162 QRS Duration: 109 QT Interval:  338 QTC Calculation: 464 R Axis:   105 Text Interpretation: Sinus tachycardia Right axis deviation Consider anterior infarct Borderline T abnormalities, inferior leads Confirmed by Milton Ferguson [phone]) on 10/23/2020 9:58:25 PM  Radiology DG Chest Port 1 View  Result Date: 10/23/2020 CLINICAL DATA:  Respiratory distress. History of prior CABG surgery. EXAM: PORTABLE CHEST 1 VIEW COMPARISON:  10/07/2020 and older exams.  CT, 09/15/2019. FINDINGS: Stable changes from prior CABG surgery. Cardiac silhouette is top-normal in size. No mediastinal or hilar masses. Bilateral vascular congestion and interstitial thickening similar to the most recent prior exam. There is opacity at the left lung base consistent with a pleural effusion, which may be increased compared to the most recent prior study. Possible small right effusion. There is opacity in the peripheral left mid to upper lung associated with pleural  thickening consistent with chronic scarring, stable. Surgical vascular clips overlie the left hilum, also stable. No pneumothorax. Skeletal structures are grossly intact. IMPRESSION: 1. Findings support mild congestive heart failure superimposed on chronic findings. Vascular congestion appears increased from the prior study. Left pleural effusion may be larger. Probable small right effusion. 2. Other findings detailed above are chronic and without change from the prior exam. Electronically Signed   By: Lajean Manes M.D.   On: 10/23/2020 21:55    Procedures Procedures   Medications Ordered in ED Medications  vancomycin (VANCOREADY) IVPB 2000 mg/400 mL (2,000 mg Intravenous New Bag/Given 10/23/20 2311)  ceFEPIme (MAXIPIME) 1 g in sodium chloride 0.9 % 100 mL IVPB (0 g Intravenous Stopped 10/23/20 2308)  furosemide (LASIX) injection 40 mg (40 mg Intravenous Given 10/23/20 2211)    ED Course  I have reviewed the triage vital signs and the nursing notes.  Pertinent labs & imaging results that were available during my care of the patient were reviewed by me and considered in my medical decision making (see chart for details). CRITICAL CARE Performed by: Milton Ferguson Total critical care time: 40 minutes Critical care time was exclusive of separately billable procedures  and treating other patients. Critical care was necessary to treat or prevent imminent or life-threatening deterioration. Critical care was time spent personally by me on the following activities: development of treatment plan with patient and/or surrogate as well as nursing, discussions with consultants, evaluation of patient's response to treatment, examination of patient, obtaining history from patient or surrogate, ordering and performing treatments and interventions, ordering and review of laboratory studies, ordering and review of radiographic studies, pulse oximetry and re-evaluation of patient's condition.    MDM  Rules/Calculators/A&P                           Patient with hypoxia and congestive heart failure.  He will be admitted to medicine on BiPAP Final Clinical Impression(s) / ED Diagnoses Final diagnoses:  None    Rx / DC Orders ED Discharge Orders     None        Milton Ferguson, MD 10/24/20 1120

## 2020-10-24 ENCOUNTER — Encounter (HOSPITAL_COMMUNITY): Payer: Self-pay | Admitting: Family Medicine

## 2020-10-24 DIAGNOSIS — R7989 Other specified abnormal findings of blood chemistry: Secondary | ICD-10-CM

## 2020-10-24 DIAGNOSIS — J9622 Acute and chronic respiratory failure with hypercapnia: Secondary | ICD-10-CM

## 2020-10-24 DIAGNOSIS — J9621 Acute and chronic respiratory failure with hypoxia: Secondary | ICD-10-CM

## 2020-10-24 DIAGNOSIS — I5043 Acute on chronic combined systolic (congestive) and diastolic (congestive) heart failure: Secondary | ICD-10-CM

## 2020-10-24 DIAGNOSIS — R778 Other specified abnormalities of plasma proteins: Secondary | ICD-10-CM

## 2020-10-24 DIAGNOSIS — D72829 Elevated white blood cell count, unspecified: Secondary | ICD-10-CM

## 2020-10-24 LAB — COMPREHENSIVE METABOLIC PANEL
ALT: 26 U/L (ref 0–44)
AST: 16 U/L (ref 15–41)
Albumin: 3.4 g/dL — ABNORMAL LOW (ref 3.5–5.0)
Alkaline Phosphatase: 89 U/L (ref 38–126)
Anion gap: 7 (ref 5–15)
BUN: 25 mg/dL — ABNORMAL HIGH (ref 8–23)
CO2: 36 mmol/L — ABNORMAL HIGH (ref 22–32)
Calcium: 8.6 mg/dL — ABNORMAL LOW (ref 8.9–10.3)
Chloride: 96 mmol/L — ABNORMAL LOW (ref 98–111)
Creatinine, Ser: 0.93 mg/dL (ref 0.61–1.24)
GFR, Estimated: >60 mL/min (ref >60)
Glucose, Bld: 103 mg/dL — ABNORMAL HIGH (ref 70–99)
Potassium: 4.3 mmol/L (ref 3.5–5.1)
Sodium: 139 mmol/L (ref 135–145)
Total Bilirubin: 0.5 mg/dL (ref 0.3–1.2)
Total Protein: 6.7 g/dL (ref 6.5–8.1)

## 2020-10-24 LAB — GLUCOSE, CAPILLARY
Glucose-Capillary: 101 mg/dL — ABNORMAL HIGH (ref 70–99)
Glucose-Capillary: 121 mg/dL — ABNORMAL HIGH (ref 70–99)
Glucose-Capillary: 103 mg/dL — ABNORMAL HIGH (ref 70–99)
Glucose-Capillary: 106 mg/dL — ABNORMAL HIGH (ref 70–99)

## 2020-10-24 LAB — MAGNESIUM: Magnesium: 2.2 mg/dL (ref 1.7–2.4)

## 2020-10-24 LAB — MRSA NEXT GEN BY PCR, NASAL: MRSA by PCR Next Gen: NOT DETECTED

## 2020-10-24 LAB — TROPONIN I (HIGH SENSITIVITY)
Troponin I (High Sensitivity): 43 ng/L — ABNORMAL HIGH (ref <18)
Troponin I (High Sensitivity): 60 ng/L — ABNORMAL HIGH (ref <18)
Troponin I (High Sensitivity): 56 ng/L — ABNORMAL HIGH (ref <18)

## 2020-10-24 LAB — PROCALCITONIN: Procalcitonin: <0.1 ng/mL

## 2020-10-24 LAB — APTT: aPTT: 32 seconds (ref 24–36)

## 2020-10-24 LAB — LACTIC ACID, PLASMA
Lactic Acid, Venous: 1.9 mmol/L (ref 0.5–1.9)
Lactic Acid, Venous: 1.4 mmol/L (ref 0.5–1.9)

## 2020-10-24 MED ORDER — VANCOMYCIN HCL 1750 MG/350ML IV SOLN: 1750.0000 mg | INTRAVENOUS | Status: DC

## 2020-10-24 MED ORDER — ACETAMINOPHEN 650 MG RE SUPP: 650.0000 mg | Freq: Four times a day (QID) | RECTAL | Status: DC | PRN

## 2020-10-24 MED ORDER — INSULIN ASPART 100 UNIT/ML IJ SOLN
0.0000 [IU] | Freq: Three times a day (TID) | INTRAMUSCULAR | Status: DC
  Administered 2020-10-24: 1 [IU] via SUBCUTANEOUS
  Administered 2020-10-25: 2 [IU] via SUBCUTANEOUS
  Administered 2020-10-26: 1 [IU] via SUBCUTANEOUS
  Administered 2020-10-26: 2 [IU] via SUBCUTANEOUS
  Administered 2020-10-27 – 2020-10-28 (×3): 1 [IU] via SUBCUTANEOUS

## 2020-10-24 MED ORDER — ACETAMINOPHEN 325 MG PO TABS: 650.0000 mg | ORAL_TABLET | Freq: Four times a day (QID) | ORAL | Status: DC | PRN

## 2020-10-24 MED ORDER — ORAL CARE MOUTH RINSE
15.0000 mL | Freq: Two times a day (BID) | OROMUCOSAL | Status: DC
  Administered 2020-10-24 – 2020-10-25 (×4): 15 mL via OROMUCOSAL

## 2020-10-24 MED ORDER — ALBUTEROL SULFATE (2.5 MG/3ML) 0.083% IN NEBU
2.5000 mg | INHALATION_SOLUTION | RESPIRATORY_TRACT | Status: DC | PRN
  Administered 2020-10-25: 2.5 mg via RESPIRATORY_TRACT
  Filled 2020-10-24 (×2): qty 3

## 2020-10-24 MED ORDER — HEPARIN SODIUM (PORCINE) 5000 UNIT/ML IJ SOLN
5000.0000 [IU] | Freq: Three times a day (TID) | INTRAMUSCULAR | Status: DC
  Administered 2020-10-24 – 2020-10-26 (×8): 5000 [IU] via SUBCUTANEOUS
  Filled 2020-10-24 (×8): qty 1

## 2020-10-24 MED ORDER — SODIUM CHLORIDE 0.9 % IV SOLN
2.0000 g | Freq: Three times a day (TID) | INTRAVENOUS | Status: DC
  Administered 2020-10-24: 2 g via INTRAVENOUS
  Filled 2020-10-24: qty 2

## 2020-10-24 MED ORDER — ONDANSETRON HCL 4 MG/2ML IJ SOLN: 4.0000 mg | Freq: Four times a day (QID) | INTRAMUSCULAR | Status: DC | PRN

## 2020-10-24 MED ORDER — FUROSEMIDE 10 MG/ML IJ SOLN
40.0000 mg | Freq: Two times a day (BID) | INTRAMUSCULAR | Status: DC
  Administered 2020-10-24 – 2020-10-28 (×9): 40 mg via INTRAVENOUS
  Filled 2020-10-24 (×9): qty 4

## 2020-10-24 MED ORDER — ALBUTEROL SULFATE (2.5 MG/3ML) 0.083% IN NEBU
2.5000 mg | INHALATION_SOLUTION | Freq: Four times a day (QID) | RESPIRATORY_TRACT | Status: DC
  Administered 2020-10-24: 2.5 mg via RESPIRATORY_TRACT
  Filled 2020-10-24: qty 3

## 2020-10-24 MED ORDER — INSULIN ASPART 100 UNIT/ML IJ SOLN: 0.0000 [IU] | Freq: Every day | INTRAMUSCULAR | Status: DC

## 2020-10-24 MED ORDER — CHLORHEXIDINE GLUCONATE CLOTH 2 % EX PADS
6.0000 | MEDICATED_PAD | Freq: Every day | CUTANEOUS | Status: DC
  Administered 2020-10-24 – 2020-10-28 (×5): 6 via TOPICAL

## 2020-10-24 MED ORDER — CHLORHEXIDINE GLUCONATE 0.12 % MT SOLN
15.0000 mL | Freq: Two times a day (BID) | OROMUCOSAL | Status: DC
  Administered 2020-10-24 – 2020-10-28 (×6): 15 mL via OROMUCOSAL
  Filled 2020-10-24 (×8): qty 15

## 2020-10-24 MED ORDER — ONDANSETRON HCL 4 MG PO TABS: 4.0000 mg | ORAL_TABLET | Freq: Four times a day (QID) | ORAL | Status: DC | PRN

## 2020-10-24 MED ORDER — MORPHINE SULFATE (PF) 2 MG/ML IV SOLN: 2.0000 mg | INTRAVENOUS | Status: DC | PRN

## 2020-10-24 NOTE — Plan of Care (Signed)
  Problem: Acute Rehab PT Goals(only PT should resolve) Goal: Pt will Roll Supine to Side Outcome: Progressing Flowsheets (Taken 10/24/2020 1316) Pt will Roll Supine to Side: with supervision Goal: Pt Will Go Supine/Side To Sit Outcome: Progressing Flowsheets (Taken 10/24/2020 1316) Pt will go Supine/Side to Sit: with min guard assist Goal: Pt Will Go Sit To Supine/Side Outcome: Progressing Flowsheets (Taken 10/24/2020 1316) Pt will go Sit to Supine/Side: with min guard assist Goal: Patient Will Perform Sitting Balance Outcome: Progressing Flowsheets (Taken 10/24/2020 1316) Patient will perform sitting balance:  with supervision  1-2 min  with no UE support Goal: Patient Will Transfer Sit To/From Stand Outcome: Progressing Flowsheets (Taken 10/24/2020 1316) Patient will transfer sit to/from stand:  with minimal assist  from elevated surface Goal: Pt Will Transfer Bed To Chair/Chair To Bed Outcome: Progressing Flowsheets (Taken 10/24/2020 1316) Pt will Transfer Bed to Chair/Chair to Bed: min guard assist Goal: Pt Will Ambulate Outcome: Progressing Flowsheets (Taken 10/24/2020 1316) Pt will Ambulate:  with min guard assist  with supervision  with least restrictive assistive device  25 feet    Pamala Hurry D. Hartnett-Rands, MS, PT Per [place] [phone] 10/24/2020

## 2020-10-24 NOTE — Progress Notes (Signed)
Patient seen and examined.  Admitted after midnight secondary to shortness of breath and hypoxia.  Patient was admitted approximately 2 weeks ago secondary to mechanical fall resulting in multiple rib fractures; since his admission here he was discharged to skilled nursing facility for rehabilitation and further care, and ended developing pneumonia with admission at [facility] for it.  After patient completing treatment he was discharged back to nursing facility and approximately 2 days has passed since he returned there, when he experienced worsening shortness of breath, dyspnea on exertion, complains of orthopnea and found to be hypoxic.  (Per patient's son report, he was preceded at the nursing facility by his granddaughter remember having a burger meal approximately 24 hours prior to his decompensation).  Work-up in the emergency department demonstrating elevated BNP, chest x-ray with vascular congestion and interstitial edema and the patient was hypoxic requiring transient use of BiPAP.  Hemodynamically stable currently.  Please refer to H&P written by Dr. Clearence Ped on 10/24/20 for further info/details of admission.  Plan: -Continue to follow daily weights, strict I's and O's and low-sodium diet. -Continue IV diuresis -Close monitoring of patient renal function and electrolytes -Continue to wean of oxygen supplementation as tolerated -Follow clinical response. -If required, given his extensive history of heart problems and systolic heart failure will request cardiology service consultation.  Barton Dubois MD [phone]

## 2020-10-24 NOTE — Evaluation (Signed)
Physical Therapy Evaluation Patient Details Name: Benjamin Cordova MRN: [id number] DOB: July 06, 1934 Today's Date: 10/24/2020   History of Present Illness  Benjamin Cordova  is a 86 y.o. male, with history of coronary artery disease status post CABG, hypertension, stroke, ischemic cardiomyopathy, peripheral vascular disease, and more presents the ED with a chief complaint of hypoxia.  Son at bedside provides the history as patient is altered and BiPAP.  Son reports that patient had been here after a fall that was mechanical.  He used to ambulate with a walker, toppled over to the side with a walker, and had broken ribs.  He was admitted at that time, discharged to the [facility].  Patient then developed pneumonia, and was admitted to [facility].  Today he was going to go to [facility] but they were on diversion so they came to the ED.  In ER patient respiratory distress.  Patient been complaining of shortness of breath since he was diagnosed with pneumonia, and it seems to be constant.  Then today he had sudden onset of worsening shortness of breath, and hypoxia.  Son does report that granddaughter of patient had been to visit him and provided him with a salty burger meal.  He was given a breathing treatment at [facility], and his O2 sats were just were not coming up, so they brought him to the ED.  According to signout, EMS arrived to find patient's oxygen saturation was in the 30s.  To  's knowledge patient has not complained of chest pain.  He is only recently been off antibiotics for pneumonia and UTI.  Apparently since has been back to the [facility] he was only there for 2 days.  Son is unsure what antibiotics were prescribed last time.   Clinical Impression  Patient agreeable to participating in PT evaluation today. Patient currently on oxygen 2 LPM through nasal cannula. Patient able to sit at edge of bed with intermittent assistance as he was sitting on air mattress. Patient requires  assistance for all mobility. Patient required cues for sequencing of steps during sit to stand transfers with RW. Patient relies heavily on upper extremities during RW use. Patient reports he typically walks with a bilateral platform walker. Patient limited by fatigue.  Patient would continue to benefit from skilled physical therapy in current environment and next venue to continue return to prior function and increase strength, endurance, balance, coordination, and functional mobility and gait skills.      Follow Up Recommendations SNF;Home health PT;Supervision for mobility/OOB;Supervision/Assistance - 24 hour    Equipment Recommendations  None recommended by PT    Recommendations for Other Services       Precautions / Restrictions Precautions Precautions: Fall Precaution Comments: Rt rib fxs Restrictions Weight Bearing Restrictions: No      Mobility  Bed Mobility Overal bed mobility: Needs Assistance Bed Mobility: Supine to Sit;Sit to Supine     Supine to sit: Min guard Sit to supine: Min assist   General bed mobility comments: min assist for lower extremities    Transfers Overall transfer level: Needs assistance Equipment used: Rolling walker (2 wheeled) Transfers: Risk manager;Anterior-Posterior Transfer Sit to Stand: Mod assist Stand pivot transfers: Min assist;Min guard   Anterior-Posterior transfers: Min guard;Supervision   General transfer comment: difficulty requiring assistance for power up  Ambulation/Gait Ambulation/Gait assistance: Min guard Gait Distance (Feet): 12 Feet Assistive device: Rolling walker (2 wheeled) Gait Pattern/deviations: Step-through pattern;Decreased step length - right;Decreased step length - left;Decreased stride  length;Trunk flexed;Wide base of support Gait velocity: decreased   General Gait Details: slow, labored gait with RW; heavy use of bilateral upper extremities, on 2 LPM through nasal cannula, limited by  fatigue  Stairs            Wheelchair Mobility    Modified Rankin (Stroke Patients Only)       Balance Overall balance assessment: Needs assistance Sitting-balance support: Feet supported;No upper extremity supported Sitting balance-Leahy Scale: Fair Sitting balance - Comments: at EOB   Standing balance support: During functional activity;Bilateral upper extremity supported Standing balance-Leahy Scale: Fair Standing balance comment: using RW                             Pertinent Vitals/Pain Pain Assessment: No/denies pain Pain Location: denies pain at rest; 6/10 with movement in right ribs Pain Intervention(s): Limited activity within patient's tolerance;Monitored during session;Repositioned    Home Living Family/patient expects to be discharged to:: Private residence Living Arrangements: Alone Available Help at Discharge: Available PRN/intermittently Type of Home: House Home Access: Stairs to enter Entrance Stairs-Rails: Left Entrance Stairs-Number of Steps: 3 Home Layout: One level Home Equipment: Walker - 4 wheels;Walker - 2 wheels;Grab bars - tub/shower;Shower seat - built in;Shower seat;Bedside commode;Hand held shower head Additional Comments: Pt states that he does not have any family that can stay with him on a regular basis.    Prior Function Level of Independence: Independent with assistive device(s)         Comments:  (Pt had polio as a child and has Rt ankle weakness, walks with a limp.  Pt has a RW with B platform to bear weight on.  PT does not want to walk until he can get his own walker here; he has called his son who states that he will get the walker here today.)     Hand Dominance   Dominant Hand: Right    Extremity/Trunk Assessment   Upper Extremity Assessment Upper Extremity Assessment: Generalized weakness    Lower Extremity Assessment Lower Extremity Assessment: Generalized weakness    Cervical / Trunk  Assessment Cervical / Trunk Assessment: Kyphotic  Communication   Communication: No difficulties;HOH  Cognition Arousal/Alertness: Awake/alert Behavior During Therapy: WFL for tasks assessed/performed Overall Cognitive Status: Within Functional Limits for tasks assessed                                        General Comments      Exercises     Assessment/Plan    PT Assessment Patient needs continued PT services  PT Problem List Decreased strength;Decreased knowledge of use of DME;Decreased activity tolerance;Decreased balance;Decreased mobility       PT Treatment Interventions DME instruction;Balance training;Gait training;Neuromuscular re-education;Stair training;Functional mobility training;Patient/family education;Therapeutic activities;Therapeutic exercise    PT Goals (Current goals can be found in the Care Plan section)  Acute Rehab PT Goals Patient Stated Goal: Go back to SNF to get stronger before returning home. PT Goal Formulation: With patient Time For Goal Achievement: 11/07/20 Potential to Achieve Goals: Fair    Frequency Min 3X/week   Barriers to discharge           AM-PAC PT "6 Clicks" Mobility  Outcome Measure Help needed turning from your back to your side while in a flat bed without using bedrails?: A Little Help needed moving from lying on  your back to sitting on the side of a flat bed without using bedrails?: A Little Help needed moving to and from a bed to a chair (including a wheelchair)?: A Lot Help needed standing up from a chair using your arms (e.g., wheelchair or bedside chair)?: A Lot Help needed to walk in hospital room?: A Lot Help needed climbing 3-5 steps with a railing? : Total 6 Click Score: 13    End of Session Equipment Utilized During Treatment: Gait belt;Oxygen Activity Tolerance: Patient limited by fatigue Patient left: with call bell/phone within reach;in bed Nurse Communication: Mobility status (Patient  requesting breathing treatment.) PT Visit Diagnosis: Unsteadiness on feet (R26.81);History of falling (Z91.81);Difficulty in walking, not elsewhere classified (R26.2);Muscle weakness (generalized) (M62.81) Pain - Right/Left: Right Pain - part of body:  (R flank)    Time: [id number] PT Time Calculation (min) (ACUTE ONLY): 30 min   Charges:   PT Evaluation $PT Eval Low Complexity: 1 Low PT Treatments $Therapeutic Activity: 8-22 mins        Floria Raveling. Hartnett-Rands, MS, PT Per [place] [phone]  Pamala Hurry  Hartnett-Rands 10/24/2020, 1:11 PM

## 2020-10-24 NOTE — Consult Note (Signed)
Consultation Note Date: 10/24/2020   Patient Name: Benjamin Cordova  DOB: Aug 09, 1934  MRN: [id number]  Age / Sex: 86 y.o., male  PCP: Pablo Lawrence, NP Referring Physician: Barton Dubois, MD  Reason for Consultation: Establishing goals of care and Psychosocial/spiritual support  HPI/Patient Profile: 86 y.o. male  with past medical history of recent hospitalization at [facility] 7/4 for mechanical fall with rib fracture, hospitalization at [facility] ER 7/14 through 7/21 for pneumonia, CAD SP CABG, HTN/HLD, PVD, stroke, ischemic cardiomyopathy, OA, admitted on 10/23/2020 with acute hypoxic respiratory failure with hypercapnia requiring BiPAP with 100% FiO2, reduced this morning to 2 L nasal cannula.   Clinical Assessment and Goals of Care: I have reviewed medical records including EPIC notes, labs and imaging, received report from RN, assessed the patient.  Benjamin Cordova is lying quietly in bed.  He greets me making and mostly keeping eye contact.  He appears chronically ill and somewhat frail.  He is alert and oriented, able to make his basic needs known.  There is no family at bedside at this time.  Benjamin Cordova was seen by the PMT earlier in this month.  We talked about the benefits of palliative medicine for discussing diagnosis, prognosis, GOC, EOL wishes, disposition and options.  I introduced Palliative Medicine as specialized medical care for people living with serious illness. It focuses on providing relief from the symptoms and stress of a serious illness. The goal is to improve quality of life for both the patient and the family.  Benjamin Cordova is able to accurately tell me what is been going on with his health.  He shares that he was hospitalized for pneumonia, and was hospitalized at [facility] for continued breathing problems.  We talked about medical treatment, diuresis.  At this point Benjamin Cordova would like to continue to  treat the treatable, but no CPR or intubation.  He is agreeable to the use of BiPAP as needed.  He would like to continue with short-term rehab, although his preference would be to transfer to [place] center instead of returning to [facility].  We talked about healthcare power of attorney, he tells me that he would like his 2 adult sons to make choices as a team if he is unable.  Hospice and Palliative Care services outpatient were explained and offered.  He is agreeable to outpatient palliative services.  Discussed the importance of continued conversation with family and the medical providers regarding overall plan of care and treatment options, ensuring decisions are within the context of the patient's values and GOCs.  Questions and concerns were addressed.  The family was encouraged to call with questions or concerns.  PMT will continue to support holistically.  Conference with attending, bedside nursing staff and transition of care team related to patient condition, needs, goals of care, disposition.  HCPOA   NEXT OF KIN -Benjamin Cordova names his 2 adult sons as his Ambulance person.  He asks that they make choices as a team.    SUMMARY OF  RECOMMENDATIONS   At this point continue to treat the treatable but no CPR or intubation Agreeable to return to short-term rehab, prefers transfer to [facility] Outpatient palliative services to follow.  Code Status/Advance Care Planning: DNR  Symptom Management:  Per hospitalist, no additional needs at this time.  Palliative Prophylaxis:  Frequent Pain Assessment and Oral Care  Additional Recommendations (Limitations, Scope, Preferences): Continue to treat the treatable but no CPR or intubation.  Psycho-social/Spiritual:  Desire for further Chaplaincy support:no Additional Recommendations: Caregiving  Support/Resources and Education on Hospice  Prognosis:  Unable to determine, based on outcomes.  6 months or less would not be  surprising based on decreasing functional status, 3 hospital visits in the last 6 weeks (including 1 at [facility]).  Discharge Planning:  Agreeable to return to short-term rehab, requesting transfer to [place] center.  Outpatient palliative services to follow.       Primary Diagnoses: Present on Admission:  Acute on chronic respiratory failure with hypoxia and hypercapnia (HCC)  Leukocytosis   I have reviewed the medical record, interviewed the patient and family, and examined the patient. The following aspects are pertinent.  Past Medical History:  Diagnosis Date   Arthritis    Coronary artery disease    a. s/p CABG in 1981 b. cath in 10/2014 showing occluded grafts --> redo CABG in 2016 LIMA-LAD and Left Radial-OM2   Essential hypertension    Hematuria    History of blood transfusion 07/2015   Knee replacement HgB <9   History of pneumonia    History of stroke    Ischemic cardiomyopathy    Left ventricular apical thrombus    Mixed hyperlipidemia    Myocardial infarction [facility]) 1980   Peripheral vascular disease (HCC)    Polio osteopathy of lower leg (HCC) Age 89   Affected right leg   Skin cancer    TIA (transient ischemic attack) 1989   Chronic coumadin   UTI (lower urinary tract infection)    Social History   Socioeconomic History   Marital status: Widowed    Spouse name: Not on file   Number of children: Not on file   Years of education: Not on file   Highest education level: Not on file  Occupational History   Not on file  Tobacco Use   Smoking status: Former    Packs/day: 1.00    Years: 28.00    Pack years: 28.00    Types: Cigarettes    Quit date: 09/22/1978    Years since quitting: 42.1   Smokeless tobacco: Never  Vaping Use   Vaping Use: Never used  Substance and Sexual Activity   Alcohol use: No    Alcohol/week: 0.0 standard drinks   Drug use: No   Sexual activity: Not on file  Other Topics Concern   Not on file  Social History Narrative   Not  on file   Social Determinants of Health   Financial Resource Strain: Not on file  Food Insecurity: Not on file  Transportation Needs: Not on file  Physical Activity: Not on file  Stress: Not on file  Social Connections: Not on file   Family History  Problem Relation Age of Onset   Heart attack Mother    Diabetes Mother    Heart attack Father    Heart attack Brother    Anesthesia problems Neg Hx    Hypotension Neg Hx    Malignant hyperthermia Neg Hx    Pseudochol deficiency Neg Hx  Scheduled Meds:  chlorhexidine  15 mL Mouth Rinse BID   Chlorhexidine Gluconate Cloth  6 each Topical Q0600   furosemide  40 mg Intravenous BID   heparin  5,000 Units Subcutaneous Q8H   insulin aspart  0-5 Units Subcutaneous QHS   insulin aspart  0-9 Units Subcutaneous TID WC   mouth rinse  15 mL Mouth Rinse q12n4p   Continuous Infusions: PRN Meds:.acetaminophen **OR** acetaminophen, albuterol, morphine injection, ondansetron **OR** ondansetron (ZOFRAN) IV Medications Prior to Admission:  Prior to Admission medications   Medication Sig Start Date End Date Taking? Authorizing Provider  acetaminophen (TYLENOL) 325 MG tablet Take 2 tablets (650 mg total) by mouth every 6 (six) hours. 10/11/20   Johnson, Clanford L, MD  albuterol (PROVENTIL) (2.5 MG/3ML) 0.083% nebulizer solution Take 2.5 mg by nebulization every 6 (six) hours as needed for wheezing or shortness of breath.  08/29/19   [provider]  ascorbic acid (VITAMIN C) 500 MG tablet Take 1,000 mg by mouth 2 (two) times daily.    [provider]  diphenhydrAMINE (BENADRYL) 50 MG capsule Take 1 capsule (50 mg total) by mouth at bedtime as needed for sleep. 10/11/20   Johnson, Clanford L, MD  finasteride (PROSCAR) 5 MG tablet Take 5 mg by mouth daily. 04/20/19   [provider]  folic acid (FOLVITE) Q000111Q MCG tablet Take 400 mcg by mouth daily.    [provider]  furosemide (LASIX) 40 MG tablet Take 1 tablet (40 mg  total) by mouth daily. 10/11/20   Johnson, Clanford L, MD  metoprolol succinate (TOPROL-XL) 25 MG 24 hr tablet Take 0.5 tablets (12.5 mg total) by mouth daily. 10/12/20   Johnson, Clanford L, MD  Multiple Vitamins-Minerals (CERTAVITE/ANTIOXIDANTS) TABS Take 1 tablet by mouth daily.     [provider]  oxyCODONE (OXY IR/ROXICODONE) 5 MG immediate release tablet Take 1 tablet (5 mg total) by mouth every 6 (six) hours as needed for severe pain. 10/11/20   Johnson, Clanford L, MD  potassium chloride (K-DUR) 10 MEQ tablet Take 10 mEq by mouth daily. Take with Lasix (Furosemide) 03/18/18   [provider]  senna-docusate (SENOKOT-S) 8.6-50 MG tablet Take 1 tablet by mouth at bedtime. 10/11/20   Johnson, Clanford L, MD  simvastatin (ZOCOR) 40 MG tablet Take 40 mg by mouth at bedtime.     [provider]  spironolactone (ALDACTONE) 25 MG tablet Take 0.5 tablets (12.5 mg total) by mouth daily. 11/10/19 10/05/20  Ahmed Prima, Fransisco Hertz, PA-C  tamsulosin (FLOMAX) 0.4 MG CAPS capsule Take 0.4 mg by mouth in the morning.  12/02/18   [provider]  warfarin (COUMADIN) 5 MG tablet Take 1.5 tablets (7.5 mg total) by mouth daily at 6 PM. Take 10 mg every night except Friday take 5 mg 10/11/20   Murlean Iba, MD   No Known Allergies Review of Systems  Unable to perform ROS: Age   Physical Exam Vitals and nursing note reviewed.  Constitutional:      General: He is not in acute distress.    Appearance: He is normal weight. He is ill-appearing.  HENT:     Mouth/Throat:     Mouth: Mucous membranes are moist.  Cardiovascular:     Rate and Rhythm: Normal rate.  Pulmonary:     Effort: Pulmonary effort is normal. No respiratory distress.  Skin:    General: Skin is warm and dry.  Neurological:     Mental Status: He is alert and oriented to  person, place, and time.  Psychiatric:        Mood and Affect: Mood normal.        Behavior: Behavior normal.    Vital Signs: BP 122/69    Pulse (!) 101   Temp (!) 97.5 F (36.4 C) (Oral)   Resp (!) 25   Ht '5\' 8"'$  (1.727 m)   Wt 104.9 kg   SpO2 95%   BMI 35.16 kg/m  Pain Scale: 0-10 POSS *See Group Information*: 1-Acceptable,Awake and alert Pain Score: Asleep   SpO2: SpO2: 95 % O2 Device:SpO2: 95 % O2 Flow Rate: .O2 Flow Rate (L/min): 2 L/min  IO: Intake/output summary:  Intake/Output Summary (Last 24 hours) at 10/24/2020 1309 Last data filed at 10/24/2020 0955 Gross per 24 hour  Intake 960.52 ml  Output 3950 ml  Net -2989.48 ml    LBM: Last BM Date: 10/23/20 Baseline Weight: Weight: 97.5 kg Most recent weight: Weight: 104.9 kg     Palliative Assessment/Data:   Flowsheet Rows    Flowsheet Row Most Recent Value  Intake Tab   Referral Department Hospitalist  Unit at Time of Referral Intermediate Care Unit  Palliative Care Primary Diagnosis Pulmonary  Date Notified 10/24/20  Palliative Care Type Return patient Palliative Care  Reason for referral Clarify Goals of Care  Date of Admission 10/23/20  Date first seen by Palliative Care 10/24/20  # of days Palliative referral response time 0 Day(s)  # of days IP prior to Palliative referral 1  Clinical Assessment   Palliative Performance Scale Score 30%  Pain Max last 24 hours Not able to report  Pain Min Last 24 hours Not able to report  Dyspnea Max Last 24 Hours Not able to report  Dyspnea Min Last 24 hours Not able to report  Psychosocial & Spiritual Assessment   Palliative Care Outcomes        Time In: 0910 Time Out: 0940  Time Total: 30 minutes  Greater than 50%  of this time was spent counseling and coordinating care related to the above assessment and plan.  Signed by: Drue Novel, NP   Please contact Palliative Medicine Team phone at [phone] for questions and concerns.  For individual provider: See Shea Evans

## 2020-10-24 NOTE — TOC Initial Note (Signed)
Transition of Care [facility]) - Initial/Assessment Note    Patient Details  Name: Benjamin Cordova MRN: [id number] Date of Birth: Sep 09, 1934  Transition of Care [facility]) CM/SW Contact:    Natasha Bence, LCSW Phone Number: 10/24/2020, 3:57 PM  Clinical Narrative:                 Patient is an 86 year old male admitted for CHF exacerbation ([place]). CSW conducted initial assessment. Patient's son reported that patient was ambulatory prior to his fall in Earlier this month and was able to complete most ADL's independently. Patient reported that he did not want to return to [facility] and requested a referral to [place] SNF's to be closer to family. CSW placed referral with [place] [facility]'s. [facility] agreeable to take patient. CSW notified facility that Josem Kaufmann is to be started by facility. TOC to follow.   Expected Discharge Plan: Skilled Nursing Facility Barriers to Discharge: Continued Medical Work up   Patient Goals and CMS Choice Patient states their goals for this hospitalization and ongoing recovery are:: Rehab with SNF CMS Medicare.gov Compare Post Acute Care list provided to:: Patient Choice offered to / list presented to : Patient  Expected Discharge Plan and Services Expected Discharge Plan: [place] arrangements for the past 2 months: Single Family Home                                      Prior Living Arrangements/Services Living arrangements for the past 2 months: Single Family Home Lives with:: Self, Adult Children Patient language and need for interpreter reviewed:: Yes Do you feel safe going back to the place where you live?: Yes      Need for Family Participation in Patient Care: Yes (Comment) Care giver support system in place?: Yes (comment)   Criminal Activity/Legal Involvement Pertinent to Current Situation/Hospitalization: No - Comment as needed  Activities of Daily Living Home Assistive Devices/Equipment: Bedside  commode/3-in-1, Blood pressure cuff, Built-in shower seat, Dentures (specify type), Eyeglasses, Grab bars around toilet, Grab bars in shower, Hospital bed, Oxygen, Scales, Raised toilet seat with rails ADL Screening (condition at time of admission) Patient's cognitive ability adequate to safely complete daily activities?: Yes Is the patient deaf or have difficulty hearing?: Yes Does the patient have difficulty seeing, even when wearing glasses/contacts?: Yes Does the patient have difficulty concentrating, remembering, or making decisions?: No Patient able to express need for assistance with ADLs?: Yes Does the patient have difficulty dressing or bathing?: Yes Independently performs ADLs?: No Communication: Independent Dressing (OT): Needs assistance Is this a change from baseline?: Pre-admission baseline Grooming: Needs assistance Is this a change from baseline?: Pre-admission baseline Feeding: Independent Bathing: Needs assistance Is this a change from baseline?: Pre-admission baseline Toileting: Needs assistance Is this a change from baseline?: Pre-admission baseline In/Out Bed: Needs assistance Is this a change from baseline?: Pre-admission baseline Walks in Home: Needs assistance Is this a change from baseline?: Pre-admission baseline Does the patient have difficulty walking or climbing stairs?: Yes Weakness of Legs: Both Weakness of Arms/Hands: Both  Permission Sought/Granted Permission sought to share information with : Family Supports Permission granted to share information with : Yes, Verbal Permission Granted  Share Information with NAME: Gae Gallop., Elenore Rota  Permission granted to share info w AGENCY: [facility], [facility]  Permission granted to share info w Relationship: (Son)  Permission  granted to share info w Contact Information: [phone]  Emotional Assessment     Affect (typically observed): Adaptable, Appropriate Orientation: : Oriented to Self, Oriented  to Place, Oriented to  Time, Oriented to Situation Alcohol / Substance Use: Not Applicable Psych Involvement: No (comment)  Admission diagnosis:  CHF exacerbation ([place]) [I50.9] Patient Active Problem List   Diagnosis Date Noted   Elevated troponin 10/24/2020   CHF exacerbation ([place]) 10/23/2020   Hypotension 10/06/2020   Multiple rib fractures 10/05/2020   Uncontrolled pain 10/05/2020   AKI (acute kidney injury) ([place])    Weakness 06/30/2019   Dyspnea 06/30/2019   Subtherapeutic anticoagulation    Acute on chronic respiratory failure with hypoxia and hypercapnia (HCC)    Acute on chronic systolic HF (heart failure) (HCC)    LV (left ventricular) mural thrombus [id number]   Systolic and diastolic CHF, acute on chronic (HCC)    Hypokalemia    CHF (congestive heart failure) ([place]) 09/01/2015   Respiratory failure with hypoxia ([place]) 08/31/2015   HCAP (healthcare-associated pneumonia) 08/31/2015   Acute respiratory failure with hypoxia and hypercapnia (HCC)    Encounter for central line placement    Knee swelling    Subtherapeutic international normalized ratio (INR) 08/08/2015   Constipation 08/08/2015   Hyperlipidemia 08/08/2015   Blurred vision 07/29/2015   Anemia 07/29/2015   S/P total knee replacement 07/27/2015   S/P CABG x 2 10/08/2014   Chest pain 10/01/2014   Elevated INR 10/01/2014   Leukocytosis 10/01/2014   Thrombocytopenia ([place]) 10/01/2014   Chest pain at rest 10/01/2014   Unstable angina ([place]) 10/01/2014   Coagulopathy ([place]) 10/01/2014   Paroxysmal atrial fibrillation ([place]) 10/01/2014   Coronary artery disease    Hypertension    Arthritis    Peripheral vascular disease ([place])    Essential hypertension    PCP:  Pablo Lawrence, NP Pharmacy:   [facility] [place] - [address] [place] [address] Phone: [phone] Fax: [phone]  [facility] - [place], [place] [address] Phone: [phone] Fax: [phone]     Social Determinants of Health (SDOH) Interventions    Readmission Risk Interventions Readmission Risk Prevention Plan 10/11/2020 07/04/2019  Transportation Screening Complete Complete  PCP or Specialist Appt within 3-5 Days Complete Not Complete  HRI or Home Care Consult Complete Complete  Social Work Consult for [place] Planning/Counseling Complete Complete  Palliative Care Screening Not Applicable Not Applicable  Medication Review Press photographer) Complete Complete  Some recent data might be hidden

## 2020-10-24 NOTE — Progress Notes (Signed)
Placed on 3 liters nasal cannula , patient awake and talking

## 2020-10-24 NOTE — NC FL2 (Signed)
[place] LEVEL OF CARE SCREENING TOOL     IDENTIFICATION  Patient Name: Benjamin Cordova Birthdate: 04-11-1934 Sex: male Admission Date (Current Location): 10/23/2020  [facility] [place] and [place] Number:  [employer] and Address:  [facility] [address], [place]      Provider Number: [id number]  Attending Physician Name and Address:  Barton Dubois, MD  Relative Name and Phone Number:  Nickles, Blackledge (Son)   [phone]    Current Level of Care: Hospital Recommended Level of Care: [place] Prior Approval Number:    Date Approved/Denied: 10/13/14 PASRR Number: [id number]  Discharge Plan: SNF    Current Diagnoses: Patient Active Problem List   Diagnosis Date Noted   Elevated troponin 10/24/2020   CHF exacerbation ([place]) 10/23/2020   Hypotension 10/06/2020   Multiple rib fractures 10/05/2020   Uncontrolled pain 10/05/2020   AKI (acute kidney injury) ([place])    Weakness 06/30/2019   Dyspnea 06/30/2019   Subtherapeutic anticoagulation    Acute on chronic respiratory failure with hypoxia and hypercapnia (HCC)    Acute on chronic systolic HF (heart failure) (HCC)    LV (left ventricular) mural thrombus [id number]   Systolic and diastolic CHF, acute on chronic (HCC)    Hypokalemia    CHF (congestive heart failure) ([place]) 09/01/2015   Respiratory failure with hypoxia ([place]) 08/31/2015   HCAP (healthcare-associated pneumonia) 08/31/2015   Acute respiratory failure with hypoxia and hypercapnia (HCC)    Encounter for central line placement    Knee swelling    Subtherapeutic international normalized ratio (INR) 08/08/2015   Constipation 08/08/2015   Hyperlipidemia 08/08/2015   Blurred vision 07/29/2015   Anemia 07/29/2015   S/P total knee replacement 07/27/2015   S/P CABG x 2 10/08/2014   Chest pain 10/01/2014   Elevated INR 10/01/2014   Leukocytosis 10/01/2014   Thrombocytopenia ([place]) 10/01/2014   Chest  pain at rest 10/01/2014   Unstable angina ([place]) 10/01/2014   Coagulopathy ([place]) 10/01/2014   Paroxysmal atrial fibrillation ([place]) 10/01/2014   Coronary artery disease    Hypertension    Arthritis    Peripheral vascular disease ([place])    Essential hypertension     Orientation RESPIRATION BLADDER Height & Weight     Self, Time, Situation, Place  Normal Incontinent Weight: 231 lb 4.2 oz (104.9 kg) Height:  '5\' 8"'$  (172.7 cm)  BEHAVIORAL SYMPTOMS/MOOD NEUROLOGICAL BOWEL NUTRITION STATUS      Continent Diet (Diet NPO time specified  (Undifferentiated presentation (screening labs and basic nursing orders)))  AMBULATORY STATUS COMMUNICATION OF NEEDS Skin   Extensive Assist Verbally Bruising (Arm hand rib leg bilateral)                       Personal Care Assistance Level of Assistance  Bathing, Feeding, Dressing Bathing Assistance: Maximum assistance Feeding assistance: Limited assistance Dressing Assistance: Maximum assistance     Functional Limitations Info  Sight, Hearing, Speech Sight Info: Impaired Hearing Info: Impaired Speech Info: Adequate    SPECIAL CARE FACTORS FREQUENCY  PT (By licensed PT)     PT Frequency: 5x              Contractures Contractures Info: Not present    Additional Factors Info  Code Status, Allergies, Insulin Sliding Scale Code Status Info: DNR Allergies Info: n/a   Insulin Sliding Scale Info: CBG < 70: Implement Hypoglycemia Standing Orders and refer to Hypoglycemia Standing Orders sidebar report  CBG 70 - 120: 0 units   CBG 121 - 150: 0 units   CBG 151 - 200: 0 units   CBG 201 - 250: 2 units   CBG 251 - 300: 3 units   CBG 301 - 350: 4 units   CBG 351 - 400: 5 units   CBG > 400 call MD and obtain STAT lab verification       Current Medications (10/24/2020):  This is the current hospital active medication list Current Facility-Administered Medications  Medication Dose Route Frequency Provider Last Rate Last Admin   acetaminophen  (TYLENOL) tablet 650 mg  650 mg Oral Q6H PRN Zierle-Ghosh, Asia B, DO       Or   acetaminophen (TYLENOL) suppository 650 mg  650 mg Rectal Q6H PRN Zierle-Ghosh, Asia B, DO       albuterol (PROVENTIL) (2.5 MG/3ML) 0.083% nebulizer solution 2.5 mg  2.5 mg Nebulization Q6H Zierle-Ghosh, Asia B, DO   2.5 mg at 10/24/20 0821   albuterol (PROVENTIL) (2.5 MG/3ML) 0.083% nebulizer solution 2.5 mg  2.5 mg Nebulization Q4H PRN Zierle-Ghosh, Asia B, DO       chlorhexidine (PERIDEX) 0.12 % solution 15 mL  15 mL Mouth Rinse BID Zierle-Ghosh, Asia B, DO   15 mL at 10/24/20 0815   Chlorhexidine Gluconate Cloth 2 % PADS 6 each  6 each Topical Q0600 Zierle-Ghosh, Asia B, DO   6 each at 10/24/20 0600   furosemide (LASIX) injection 40 mg  40 mg Intravenous BID Zierle-Ghosh, Asia B, DO   40 mg at 10/24/20 0744   heparin injection 5,000 Units  5,000 Units Subcutaneous Q8H Zierle-Ghosh, Asia B, DO   5,000 Units at 10/24/20 0600   insulin aspart (novoLOG) injection 0-5 Units  0-5 Units Subcutaneous QHS Zierle-Ghosh, Asia B, DO       insulin aspart (novoLOG) injection 0-9 Units  0-9 Units Subcutaneous TID WC Zierle-Ghosh, Asia B, DO       MEDLINE mouth rinse  15 mL Mouth Rinse q12n4p Zierle-Ghosh, Asia B, DO       morphine 2 MG/ML injection 2 mg  2 mg Intravenous Q2H PRN Zierle-Ghosh, Asia B, DO       ondansetron (ZOFRAN) tablet 4 mg  4 mg Oral Q6H PRN Zierle-Ghosh, Asia B, DO       Or   ondansetron (ZOFRAN) injection 4 mg  4 mg Intravenous Q6H PRN Zierle-Ghosh, Asia B, DO         Discharge Medications: Please see discharge summary for a list of discharge medications.  Relevant Imaging Results:  Relevant Lab Results:   Additional Information Pt SSN: [id number]  Natasha Bence, LCSW

## 2020-10-24 NOTE — Progress Notes (Signed)
Discharge summary received from [facility] from patient's stay from 10/13/20 - 10/20/2020.

## 2020-10-24 NOTE — Progress Notes (Addendum)
Request sent to [name] for discharge summary for when patient was admitted for Pneumonia per Dr. Anson Fret request.   Addendum: [name] called to double check patient's birthday. Patient's birthday in [facility] records shows November 13, 1934. Patient confirmed 1935-01-22 is his birthday. But his birth certificate shows his date of birth as 01-17-1935. Patient stated the 08-Sep-1934 is listed on his insurance [employer] cards.   Benjamin Cordova [facility] updated about this. New request sent to [facility] with patient's birthday as May 16, 1934.

## 2020-10-24 NOTE — H&P (Signed)
TRH H&P    Patient Demographics:    Benjamin Cordova, is a 86 y.o. male  MRN: [id number]  DOB - 1935-03-05  Admit Date - 10/23/2020  Referring MD/NP/PA: Roderic Palau  Outpatient Primary MD for the patient is Pablo Lawrence, NP  Patient coming from: [facility]  Chief complaint- hypoxia   HPI:    Benjamin Cordova  is a 86 y.o. male, with history of coronary artery disease status post CABG, hypertension, stroke, ischemic cardiomyopathy, peripheral vascular disease, and more presents the ED with a chief complaint of hypoxia.  Son at bedside provides the history as patient is altered and BiPAP.  Son reports that patient had been here after a fall that was mechanical.  He used to ambulate with a walker, toppled over to the side with a walker, and had broken ribs.  He was admitted at that time, discharged to the [facility].  Patient then developed pneumonia, and was admitted to [facility].  Today he was going to go to [facility] but they were on diversion so they came to the ED.  In ER patient respiratory distress.  Patient been complaining of shortness of breath since he was diagnosed with pneumonia, and it seems to be constant.  Then today he had sudden onset of worsening shortness of breath, and hypoxia.  Son does report that granddaughter of patient had been to visit him and provided him with a salty burger meal.  He was given a breathing treatment at [facility], and his O2 sats were just were not coming up, so they brought him to the ED.  According to signout, EMS arrived to find patient's oxygen saturation was in the 30s.  To 's knowledge patient has not complained of chest pain.  He is only recently been off antibiotics for pneumonia and UTI.  Apparently since has been back to the [facility] he was only there for 2 days.  Son is unsure what antibiotics were prescribed last time.  In the ED Temp is 98, patient is  tachycardic with a heart rate of 107-117, respiratory rate 24-30, blood pressure 124/71, satting at 99% on 100% BiPAP Blood gas shows a pH of 7.260, PCO2 82.4 White blood cell count 17.4, hemoglobin 11.9 Chemistry panel mostly unremarkable aside from a hyperglycemia at 238 BNP is 386 Trope slightly uptrending from 34-43 UA is borderline, urine culture pending Blood culture pending Chest x-ray shows signs consistent with CHF EKG shows a heart rate of 115, sinus tach, QTC 464 Patient was started on vancomycin, cefepime, Lasix 40 mg IV  Patient is DNR    Review of systems:     unfortunately review of systems cannot be obtained secondary to patient's altered mental status and medical condition   Past History of the following :    Past Medical History:  Diagnosis Date   Arthritis    Coronary artery disease    a. s/p CABG in 1981 b. cath in 10/2014 showing occluded grafts --> redo CABG in 2016 LIMA-LAD and Left Radial-OM2   Essential hypertension  Hematuria    History of blood transfusion 07/2015   Knee replacement HgB <9   History of pneumonia    History of stroke    Ischemic cardiomyopathy    Left ventricular apical thrombus    Mixed hyperlipidemia    Myocardial infarction [facility]) 1980   Peripheral vascular disease ([place])    Polio osteopathy of lower leg ([place]) Age 44   Affected right leg   Skin cancer    TIA (transient ischemic attack) 1989   Chronic coumadin   UTI (lower urinary tract infection)       Past Surgical History:  Procedure Laterality Date   CARDIAC CATHETERIZATION N/A 10/05/2014   Procedure: Left Heart Cath and Coronary Angiography;  Surgeon: Peter M Martinique, MD;  Location: [place] [facility];  Service: Cardiovascular;  Laterality: N/A;   CORONARY ARTERY BYPASS GRAFT  1981   [facility]   CORONARY ARTERY BYPASS GRAFT N/A 10/08/2014   Procedure: REDO CORONARY ARTERY BYPASS GRAFTING (CABG) Times Two Grafts using Left Internal Mammary and Left Radial Artery;   Surgeon: Melrose Nakayama, MD;  Location: [place];  Service: Open Heart Surgery;  Laterality: N/A;   CYSTOSCOPY WITH INJECTION  10/10/2010   Procedure: CYSTOSCOPY WITH INJECTION;  Surgeon: Marissa Nestle;  Location: [facility];  Service: Urology;  Laterality: N/A;  with retrograde urethrogram   FOOT SURGERY Right 1954   [facility], muscle implantation   JOINT REPLACEMENT     RADIAL ARTERY HARVEST Left 10/08/2014   Procedure: Left RADIAL ARTERY HARVEST;  Surgeon: Melrose Nakayama, MD;  Location: [place];  Service: Open Heart Surgery;  Laterality: Left;   TEE WITHOUT CARDIOVERSION N/A 10/08/2014   Procedure: TRANSESOPHAGEAL ECHOCARDIOGRAM (TEE);  Surgeon: Melrose Nakayama, MD;  Location: [place];  Service: Open Heart Surgery;  Laterality: N/A;   TONSILLECTOMY  1949   [facility]   TOTAL KNEE ARTHROPLASTY Left 07/27/2015   Procedure: TOTAL LEFT KNEE ARTHROPLASTY;  Surgeon: [name], MD;  Location: [place];  Service: Orthopedics;  Laterality: Left;      Social History:      Social History   Tobacco Use   Smoking status: Former    Packs/day: 1.00    Years: 28.00    Pack years: 28.00    Types: Cigarettes    Quit date: 09/22/1978    Years since quitting: 42.1   Smokeless tobacco: Never  Substance Use Topics   Alcohol use: No    Alcohol/week: 0.0 standard drinks       Family History :     Family History  Problem Relation Age of Onset   Heart attack Mother    Diabetes Mother    Heart attack Father    Heart attack Brother    Anesthesia problems Neg Hx    Hypotension Neg Hx    Malignant hyperthermia Neg Hx    Pseudochol deficiency Neg Hx       Home Medications:   Prior to Admission medications   Medication Sig Start Date End Date Taking? Authorizing Provider  acetaminophen (TYLENOL) 325 MG tablet Take 2 tablets (650 mg total) by mouth every 6 (six) hours. 10/11/20   Johnson, Clanford L, MD  albuterol (PROVENTIL) (2.5 MG/3ML) 0.083% nebulizer solution Take 2.5 mg by nebulization  every 6 (six) hours as needed for wheezing or shortness of breath.  08/29/19   [provider]  ascorbic acid (VITAMIN C) 500 MG tablet Take 1,000 mg by mouth 2 (two) times daily.  [provider]  diphenhydrAMINE (BENADRYL) 50 MG capsule Take 1 capsule (50 mg total) by mouth at bedtime as needed for sleep. 10/11/20   Johnson, Clanford L, MD  finasteride (PROSCAR) 5 MG tablet Take 5 mg by mouth daily. 04/20/19   [provider]  folic acid (FOLVITE) Q000111Q MCG tablet Take 400 mcg by mouth daily.    [provider]  furosemide (LASIX) 40 MG tablet Take 1 tablet (40 mg total) by mouth daily. 10/11/20   Johnson, Clanford L, MD  metoprolol succinate (TOPROL-XL) 25 MG 24 hr tablet Take 0.5 tablets (12.5 mg total) by mouth daily. 10/12/20   Johnson, Clanford L, MD  Multiple Vitamins-Minerals (CERTAVITE/ANTIOXIDANTS) TABS Take 1 tablet by mouth daily.     [provider]  oxyCODONE (OXY IR/ROXICODONE) 5 MG immediate release tablet Take 1 tablet (5 mg total) by mouth every 6 (six) hours as needed for severe pain. 10/11/20   Johnson, Clanford L, MD  potassium chloride (K-DUR) 10 MEQ tablet Take 10 mEq by mouth daily. Take with Lasix (Furosemide) 03/18/18   [provider]  senna-docusate (SENOKOT-S) 8.6-50 MG tablet Take 1 tablet by mouth at bedtime. 10/11/20   Johnson, Clanford L, MD  simvastatin (ZOCOR) 40 MG tablet Take 40 mg by mouth at bedtime.     [provider]  spironolactone (ALDACTONE) 25 MG tablet Take 0.5 tablets (12.5 mg total) by mouth daily. 11/10/19 10/05/20  Ahmed Prima, Fransisco Hertz, PA-C  tamsulosin (FLOMAX) 0.4 MG CAPS capsule Take 0.4 mg by mouth in the morning.  12/02/18   [provider]  warfarin (COUMADIN) 5 MG tablet Take 1.5 tablets (7.5 mg total) by mouth daily at 6 PM. Take 10 mg every night except Friday take 5 mg 10/11/20   Murlean Iba, MD     Allergies:    No Known Allergies   Physical Exam:   Vitals  Blood  pressure (!) 105/54, pulse (!) 102, temperature 98 F (36.7 C), temperature source Rectal, resp. rate (!) 21, height '5\' 8"'$  (1.727 m), weight 97.5 kg, SpO2 100 %.  1.  General: Patient lying supine in bed, on BiPAP   2. Psychiatric: Somnolent, orientation not assessed as patient is on BiPAP and too tired to answer questions, patient does give consent by nodding head for me to ask son history,    3. Neurologic: Patient currently nonverbal secondary to altered mental status/somnolence, face is symmetric, patient currently not following commands,    4. HEENMT:  Head is atraumatic, normocephalic, pupils reactive to light, neck is supple, trachea is midline, mucous membranes are dry-on BiPAP   5. Respiratory : Crackles bilaterally without rhonchi or wheezing, no cyanosis, on BiPAP  6. Cardiovascular : Heart rate tachycardic, rhythm is regular, no murmurs, rubs or gallops, 2+ peripheral edema, peripheral pulses palpated   7. Gastrointestinal:  Abdomen is soft, nondistended, nontender to palpation bowel sounds active, no masses or organomegaly palpated   8. Skin:  Skin is warm, dry and intact without rashes, acute lesions, or ulcers on limited exam   9.Musculoskeletal:  No acute deformities or trauma,  2+ edema, peripheral pulses palpated, no tenderness to palpation in the extremities     Data Review:    CBC Recent Labs  Lab 10/23/20 2129  WBC 17.4*  HGB 11.9*  HCT 37.4*  PLT 301  MCV 98.2  MCH 31.2  MCHC 31.8  RDW 12.9  LYMPHSABS 0.7  MONOABS 0.7  EOSABS 0.2  BASOSABS 0.2*   ------------------------------------------------------------------------------------------------------------------  Results for orders placed or performed during the hospital encounter of 10/23/20 (from the past 48 hour(s))  Resp Panel by RT-PCR (Flu A&B, Covid) Nasopharyngeal Swab     Status: None   Collection Time: 10/23/20  9:20 PM   Specimen: Nasopharyngeal Swab; Nasopharyngeal(NP) swabs in  vial transport medium  Result Value Ref Range   SARS Coronavirus 2 by RT PCR NEGATIVE NEGATIVE    Comment: (NOTE) SARS-CoV-2 target nucleic acids are NOT DETECTED.  The SARS-CoV-2 RNA is generally detectable in upper respiratory specimens during the acute phase of infection. The lowest concentration of SARS-CoV-2 viral copies this assay can detect is 138 copies/mL. A negative result does not preclude SARS-Cov-2 infection and should not be used as the sole basis for treatment or other patient management decisions. A negative result may occur with  improper specimen collection/handling, submission of specimen other than nasopharyngeal swab, presence of viral mutation(s) within the areas targeted by this assay, and inadequate number of viral copies(<138 copies/mL). A negative result must be combined with clinical observations, patient history, and epidemiological information. The expected result is Negative.  Fact Sheet for Patients:  EntrepreneurPulse.com.au  Fact Sheet for Healthcare Providers:  IncredibleEmployment.be  This test is no t yet approved or cleared by the [place] FDA and  has been authorized for detection and/or diagnosis of SARS-CoV-2 by FDA under an Emergency Use Authorization (EUA). This EUA will remain  in effect (meaning this test can be used) for the duration of the COVID-19 declaration under Section 564(b)(1) of the Act, 21 U.S.C.section 360bbb-3(b)(1), unless the authorization is terminated  or revoked sooner.       Influenza A by PCR NEGATIVE NEGATIVE   Influenza B by PCR NEGATIVE NEGATIVE    Comment: (NOTE) The Xpert Xpress SARS-CoV-2/FLU/RSV plus assay is intended as an aid in the diagnosis of influenza from Nasopharyngeal swab specimens and should not be used as a sole basis for treatment. Nasal washings and aspirates are unacceptable for Xpert Xpress SARS-CoV-2/FLU/RSV testing.  Fact Sheet for  Patients: EntrepreneurPulse.com.au  Fact Sheet for Healthcare Providers: IncredibleEmployment.be  This test is not yet approved or cleared by the [place] FDA and has been authorized for detection and/or diagnosis of SARS-CoV-2 by FDA under an Emergency Use Authorization (EUA). This EUA will remain in effect (meaning this test can be used) for the duration of the COVID-19 declaration under Section 564(b)(1) of the Act, 21 U.S.C. section 360bbb-3(b)(1), unless the authorization is terminated or revoked.  Performed at [facility], [address] [place], [place] [address]   CBC with Differential/Platelet     Status: Abnormal   Collection Time: 10/23/20  9:29 PM  Result Value Ref Range   WBC 17.4 (H) 4.0 - 10.5 K/uL   RBC 3.81 (L) 4.22 - 5.81 MIL/uL   Hemoglobin 11.9 (L) 13.0 - 17.0 g/dL   HCT 37.4 (L) 39.0 - 52.0 %   MCV 98.2 80.0 - 100.0 fL   MCH 31.2 26.0 - 34.0 pg   MCHC 31.8 30.0 - 36.0 g/dL   RDW 12.9 11.5 - 15.5 %   Platelets 301 150 - 400 K/uL   nRBC 0.0 0.0 - 0.2 %   Neutrophils Relative % 87 %   Neutro Abs 15.2 (H) 1.7 - 7.7 K/uL   Lymphocytes Relative 4 %   Lymphs Abs 0.7 0.7 - 4.0 K/uL   Monocytes Relative 4 %   Monocytes Absolute 0.7 0.1 - 1.0 K/uL   Eosinophils Relative 1 %  Eosinophils Absolute 0.2 0.0 - 0.5 K/uL   Basophils Relative 1 %   Basophils Absolute 0.2 (H) 0.0 - 0.1 K/uL   Immature Granulocytes 3 %   Abs Immature Granulocytes 0.47 (H) 0.00 - 0.07 K/uL    Comment: Performed at [facility], [address] [place], [place] [address]  Comprehensive metabolic panel     Status: Abnormal   Collection Time: 10/23/20  9:29 PM  Result Value Ref Range   Sodium 139 135 - 145 mmol/L   Potassium 4.5 3.5 - 5.1 mmol/L   Chloride 97 (L) 98 - 111 mmol/L   CO2 35 (H) 22 - 32 mmol/L   Glucose, Bld 238 (H) 70 - 99 mg/dL    Comment: Glucose reference range applies only to samples taken after fasting for at least 8 hours.    BUN 24 (H) 8 - 23 mg/dL   Creatinine, Ser 1.02 0.61 - 1.24 mg/dL   Calcium 8.8 (L) 8.9 - 10.3 mg/dL   Total Protein 7.1 6.5 - 8.1 g/dL   Albumin 3.8 3.5 - 5.0 g/dL   AST 18 15 - 41 U/L   ALT 31 0 - 44 U/L   Alkaline Phosphatase 105 38 - 126 U/L   Total Bilirubin 0.3 0.3 - 1.2 mg/dL   GFR, Estimated >60 >60 mL/min    Comment: (NOTE) Calculated using the CKD-EPI Creatinine Equation (2021)    Anion gap 7 5 - 15    Comment: Performed at [facility], [address], [place], [place] [address]  Brain natriuretic peptide     Status: Abnormal   Collection Time: 10/23/20  9:29 PM  Result Value Ref Range   B Natriuretic Peptide 386.0 (H) 0.0 - 100.0 pg/mL    Comment: Performed at [facility], [address] [place], [place] [address]  Troponin I (High Sensitivity)     Status: Abnormal   Collection Time: 10/23/20  9:29 PM  Result Value Ref Range   Troponin I (High Sensitivity) 34 (H) <18 ng/L    Comment: (NOTE) Elevated high sensitivity troponin I (hsTnI) values and significant  changes across serial measurements may suggest ACS but many other  chronic and acute conditions are known to elevate hsTnI results.  Refer to the "Links" section for chest pain algorithms and additional  guidance. Performed at [facility], [address] [place], [place] [address]   Protime-INR     Status: None   Collection Time: 10/23/20  9:29 PM  Result Value Ref Range   Prothrombin Time 14.3 11.4 - 15.2 seconds   INR 1.1 0.8 - 1.2    Comment: (NOTE) INR goal varies based on device and disease states. Performed at [facility] [address] [place],  [address]   Procalcitonin - Baseline     Status: None   Collection Time: 10/23/20  9:39 PM  Result Value Ref Range   Procalcitonin <0.10 ng/mL    Comment:        Interpretation: PCT (Procalcitonin) <= 0.5 ng/mL: Systemic infection (sepsis) is not likely. Local bacterial infection is possible. (NOTE)       Sepsis PCT Algorithm           Lower  Respiratory Tract                                      Infection PCT Algorithm    ----------------------------     ----------------------------  PCT < 0.25 ng/mL                PCT < 0.10 ng/mL          Strongly encourage             Strongly discourage   discontinuation of antibiotics    initiation of antibiotics    ----------------------------     -----------------------------       PCT 0.25 - 0.50 ng/mL            PCT 0.10 - 0.25 ng/mL               OR       >80% decrease in PCT            Discourage initiation of                                            antibiotics      Encourage discontinuation           of antibiotics    ----------------------------     -----------------------------         PCT >= 0.50 ng/mL              PCT 0.26 - 0.50 ng/mL               AND        <80% decrease in PCT             Encourage initiation of                                             antibiotics       Encourage continuation           of antibiotics    ----------------------------     -----------------------------        PCT >= 0.50 ng/mL                  PCT > 0.50 ng/mL               AND         increase in PCT                  Strongly encourage                                      initiation of antibiotics    Strongly encourage escalation           of antibiotics                                     -----------------------------                                           PCT <= 0.25 ng/mL  OR                                        > 80% decrease in PCT                                      Discontinue / Do not initiate                                             antibiotics  Performed at [facility], [address] [place], [place] [address]   Urinalysis, Routine w reflex microscopic Urine, Catheterized     Status: Abnormal   Collection Time: 10/23/20  9:55 PM  Result Value Ref Range   Color, Urine YELLOW YELLOW   APPearance CLEAR  CLEAR   Specific Gravity, Urine 1.012 1.005 - 1.030   pH 5.0 5.0 - 8.0   Glucose, UA NEGATIVE NEGATIVE mg/dL   Hgb urine dipstick NEGATIVE NEGATIVE   Bilirubin Urine NEGATIVE NEGATIVE   Ketones, ur NEGATIVE NEGATIVE mg/dL   Protein, ur NEGATIVE NEGATIVE mg/dL   Nitrite NEGATIVE NEGATIVE   Leukocytes,Ua TRACE (A) NEGATIVE   RBC / HPF 0-5 0 - 5 RBC/hpf   WBC, UA 11-20 0 - 5 WBC/hpf   Bacteria, UA RARE (A) NONE SEEN   Mucus PRESENT    Hyaline Casts, UA PRESENT     Comment: Performed at [facility], [address] [place], [place] [address]  Blood gas, arterial     Status: Abnormal   Collection Time: 10/23/20 10:28 PM  Result Value Ref Range   FIO2 40.00    pH, Arterial 7.260 (L) 7.350 - 7.450   pCO2 arterial 82.4 (HH) 32.0 - 48.0 mmHg    Comment: CRITICAL RESULT CALLED TO, READ BACK BY AND VERIFIED WITH: WALKER,T @ 2238 ON 10/23/20 BY JUW    pO2, Arterial 56.4 (L) 83.0 - 108.0 mmHg   Bicarbonate 30.3 (H) 20.0 - 28.0 mmol/L   Acid-Base Excess 8.8 (H) 0.0 - 2.0 mmol/L   O2 Saturation 85.8 %   Patient temperature 37.0    Allens test (pass/fail) PASS PASS    Comment: Performed at [facility], [address] [place], [place] [address]  Lactic acid, plasma     Status: None   Collection Time: 10/23/20 11:25 PM  Result Value Ref Range   Lactic Acid, Venous 1.9 0.5 - 1.9 mmol/L    Comment: Performed at [facility], [address] [place], [place] [address]  APTT     Status: None   Collection Time: 10/23/20 11:26 PM  Result Value Ref Range   aPTT 32 24 - 36 seconds    Comment: Performed at [facility], [address], [place], [place] [address]  Troponin I (High Sensitivity)     Status: Abnormal   Collection Time: 10/23/20 11:26 PM  Result Value Ref Range   Troponin I (High Sensitivity) 43 (H) <18 ng/L    Comment: (NOTE) Elevated high sensitivity troponin I (hsTnI) values and significant  changes across serial measurements may suggest ACS but many other  chronic and acute  conditions are known to elevate hsTnI results.  Refer to the "Links" section for chest pain algorithms  and additional  guidance. Performed at [facility], [address], [place], [place] [address]     Chemistries  Recent Labs  Lab 10/23/20 2129  NA 139  K 4.5  CL 97*  CO2 35*  GLUCOSE 238*  BUN 24*  CREATININE 1.02  CALCIUM 8.8*  AST 18  ALT 31  ALKPHOS 105  BILITOT 0.3   ------------------------------------------------------------------------------------------------------------------  ------------------------------------------------------------------------------------------------------------------ GFR: Estimated Creatinine Clearance: 62.1 mL/min (by C-G formula based on SCr of 1.02 mg/dL). Liver Function Tests: Recent Labs  Lab 10/23/20 2129  AST 18  ALT 31  ALKPHOS 105  BILITOT 0.3  PROT 7.1  ALBUMIN 3.8   No results for input(s): LIPASE, AMYLASE in the last 168 hours. No results for input(s): AMMONIA in the last 168 hours. Coagulation Profile: Recent Labs  Lab 10/23/20 2129  INR 1.1   Cardiac Enzymes: No results for input(s): CKTOTAL, CKMB, CKMBINDEX, TROPONINI in the last 168 hours. BNP (last 3 results) No results for input(s): PROBNP in the last 8760 hours. HbA1C: No results for input(s): HGBA1C in the last 72 hours. CBG: No results for input(s): GLUCAP in the last 168 hours. Lipid Profile: No results for input(s): CHOL, HDL, LDLCALC, TRIG, CHOLHDL, LDLDIRECT in the last 72 hours. Thyroid Function Tests: No results for input(s): TSH, T4TOTAL, FREET4, T3FREE, THYROIDAB in the last 72 hours. Anemia Panel: No results for input(s): VITAMINB12, FOLATE, FERRITIN, TIBC, IRON, RETICCTPCT in the last 72 hours.  --------------------------------------------------------------------------------------------------------------- Urine analysis:    Component Value Date/Time   COLORURINE YELLOW 10/23/2020 2155   APPEARANCEUR CLEAR 10/23/2020 2155   LABSPEC  1.012 10/23/2020 2155   PHURINE 5.0 10/23/2020 2155   GLUCOSEU NEGATIVE 10/23/2020 2155   HGBUR NEGATIVE 10/23/2020 2155   BILIRUBINUR NEGATIVE 10/23/2020 2155   KETONESUR NEGATIVE 10/23/2020 2155   PROTEINUR NEGATIVE 10/23/2020 2155   UROBILINOGEN 1.0 10/07/2014 1638   NITRITE NEGATIVE 10/23/2020 2155   LEUKOCYTESUR TRACE (A) 10/23/2020 2155      Imaging Results:    DG Chest Port 1 View  Result Date: 10/23/2020 CLINICAL DATA:  Respiratory distress. History of prior CABG surgery. EXAM: PORTABLE CHEST 1 VIEW COMPARISON:  10/07/2020 and older exams.  CT, 09/15/2019. FINDINGS: Stable changes from prior CABG surgery. Cardiac silhouette is top-normal in size. No mediastinal or hilar masses. Bilateral vascular congestion and interstitial thickening similar to the most recent prior exam. There is opacity at the left lung base consistent with a pleural effusion, which may be increased compared to the most recent prior study. Possible small right effusion. There is opacity in the peripheral left mid to upper lung associated with pleural thickening consistent with chronic scarring, stable. Surgical vascular clips overlie the left hilum, also stable. No pneumothorax. Skeletal structures are grossly intact. IMPRESSION: 1. Findings support mild congestive heart failure superimposed on chronic findings. Vascular congestion appears increased from the prior study. Left pleural effusion may be larger. Probable small right effusion. 2. Other findings detailed above are chronic and without change from the prior exam. Electronically Signed   By: Lajean Manes M.D.   On: 10/23/2020 21:55       Assessment & Plan:    Active Problems:   Leukocytosis   Acute on chronic respiratory failure with hypoxia and hypercapnia (HCC)   CHF exacerbation (HCC)   Elevated troponin   Acute hypoxic respiratory failure with hypercapnia VBG shows PCO2 of 82.4, oxygen saturations reported as 30% at arrival Patient on 100%  BiPAP Troponins slightly uptrending, but likely demand ischemia Chest x-ray  shows CHF, BNP elevated Wean off O2 as tolerated Continue treatment as below Continue albuterol as needed Respiratory panel negative Acute metabolic encephalopathy Described as somnolence Secondary to hypoxia, hypercapnia Continue treatment with BiPAP Patient does have leukocytosis, tachycardia, tachypnea, but lactic acid is normal, procalcitonin is normal, low suspicion for infection at this time CHF exacerbation CHF on chest x-ray, BNP elevated at 386 Last echo was done on July 18 and showed an ejection fraction of 20% 40 mg IV Lasix given in ED BiPAP started Continue Lasix twice daily Holding home p.o. meds secondary to BiPAP and somnolence Monitor in stepdown Elevated troponin Slightly uptrending in the setting of respiratory failure and CHF exacerbation Likely demand ischemia Continue to trend Continue to monitor telemetry Leukocytosis Leukocytosis of 17.4, initially thought to be indicative of infection UA is suspicious, urine culture pending, blood culture pending Chest x-ray does not demonstrate pneumonia Procalcitonin is less than 0.10 De-escalate antibiotics from vancomycin and cefepime to just cefepime Likely able to be able to DC cefepime -may wait for initial growth urine culture Hyperglycemia Hemoglobin A1c pending Sliding scale coverage    DVT Prophylaxis-   Heparin- SCDs   AM Labs Ordered, also please review Full Orders  Family Communication: Admission, patients condition and plan of care including tests being ordered have been discussed with the patient and son who indicate understanding and agree with the plan and Code Status.  Code Status: DNR  Admission status: Inpatient :The appropriate admission status for this patient is INPATIENT. Inpatient status is judged to be reasonable and necessary in order to provide the required intensity of service to ensure the patient's safety.  The patient's presenting symptoms, physical exam findings, and initial radiographic and laboratory data in the context of their chronic comorbidities is felt to place them at high risk for further clinical deterioration. Furthermore, it is not anticipated that the patient will be medically stable for discharge from the hospital within 2 midnights of admission. The following factors support the admission status of inpatient.     The patient's presenting symptoms include hypoxia. The worrisome physical exam findings include tachypnea, tachycardia, encephalopathy. The initial radiographic and laboratory data are worrisome because of CHF, elevated troponin, elevated BNP, leukocytosis. The chronic co-morbidities include coronary artery disease, hypertension, peripheral vascular disease.       * I certify that at the point of admission it is my clinical judgment that the patient will require inpatient hospital care spanning beyond 2 midnights from the point of admission due to high intensity of service, high risk for further deterioration and high frequency of surveillance required.*  Time spent in minutes : [place]

## 2020-10-24 NOTE — Progress Notes (Signed)
Patient alert and oriented. Patient tolerated PO fluids well, No distress, choking, or coughing noted.

## 2020-10-25 ENCOUNTER — Inpatient Hospital Stay (HOSPITAL_COMMUNITY): Payer: Medicare HMO

## 2020-10-25 DIAGNOSIS — Z7189 Other specified counseling: Secondary | ICD-10-CM

## 2020-10-25 DIAGNOSIS — Z515 Encounter for palliative care: Secondary | ICD-10-CM

## 2020-10-25 DIAGNOSIS — I5020 Unspecified systolic (congestive) heart failure: Secondary | ICD-10-CM

## 2020-10-25 LAB — GLUCOSE, CAPILLARY
Glucose-Capillary: 102 mg/dL — ABNORMAL HIGH (ref 70–99)
Glucose-Capillary: 158 mg/dL — ABNORMAL HIGH (ref 70–99)
Glucose-Capillary: 106 mg/dL — ABNORMAL HIGH (ref 70–99)
Glucose-Capillary: 124 mg/dL — ABNORMAL HIGH (ref 70–99)

## 2020-10-25 LAB — HEMOGLOBIN A1C
Hgb A1c MFr Bld: 5.5 % (ref 4.8–5.6)
Mean Plasma Glucose: 111 mg/dL

## 2020-10-25 LAB — URINE CULTURE: Culture: NO GROWTH

## 2020-10-25 MED ORDER — GUAIFENESIN-DM 100-10 MG/5ML PO SYRP
5.0000 mL | ORAL_SOLUTION | ORAL | Status: DC | PRN
  Administered 2020-10-25: 5 mL via ORAL
  Filled 2020-10-25 (×2): qty 5

## 2020-10-25 NOTE — Progress Notes (Signed)
PROGRESS NOTE    Benjamin Cordova  [id number] DOB: October 28, 1934 DOA: 10/23/2020 PCP: Pablo Lawrence, NP    Chief Complaint  Patient presents with   Respiratory Distress    Brief admission narrative:  As per H&P written by Dr.Zierle-Ghosh on 10/24/20 Benjamin Cordova  is a 86 y.o. male, with history of coronary artery disease status post CABG, hypertension, stroke, ischemic cardiomyopathy, peripheral vascular disease, and more presents the ED with a chief complaint of hypoxia.  Son at bedside provides the history as patient is altered and BiPAP.  Son reports that patient had been here after a fall that was mechanical.  He used to ambulate with a walker, toppled over to the side with a walker, and had broken ribs.  He was admitted at that time, discharged to the [facility].  Patient then developed pneumonia, and was admitted to [facility].  Today he was going to go to [facility] but they were on diversion so they came to the ED.  In ER patient respiratory distress.  Patient been complaining of shortness of breath since he was diagnosed with pneumonia, and it seems to be constant.  Then today he had sudden onset of worsening shortness of breath, and hypoxia.  Son does report that granddaughter of patient had been to visit him and provided him with a salty burger meal.  He was given a breathing treatment at [facility], and his O2 sats were just were not coming up, so they brought him to the ED.  According to signout, EMS arrived to find patient's oxygen saturation was in the 30s.  To 's knowledge patient has not complained of chest pain.  He is only recently been off antibiotics for pneumonia and UTI.  Apparently since has been back to the [facility] he was only there for 2 days.  Son is unsure what antibiotics were prescribed last time.   In the ED Temp is 98, patient is tachycardic with a heart rate of 107-117, respiratory rate 24-30, blood pressure 124/71, satting at 99% on 100%  BiPAP Blood gas shows a pH of 7.260, PCO2 82.4 White blood cell count 17.4, hemoglobin 11.9 Chemistry panel mostly unremarkable aside from a hyperglycemia at 238 BNP is 386 [place] slightly uptrending from 34-43 UA is borderline, urine culture pending Blood culture pending Chest x-ray shows signs consistent with CHF EKG shows a heart rate of 115, sinus tach, QTC 464 Patient was started on vancomycin, cefepime, Lasix 40 mg IV   Patient is DNR  Assessment & Plan: 1-acute on chronic respiratory failure with hypoxia in the setting of combined systolic and diastolic heart failure. -CHF exacerbation in the setting of diet indiscretion and recent IV therapies. -Continue to follow daily weights, strict I's and O's and low-sodium diet -Continue IV diuresis -Continue to wean off oxygen supplementation as tolerated and follow clinical response. -Patient responding appropriately. -Follow renal function electrolytes closely while providing diuresis.  2-leukocytosis -Without clear source of infection currently -Continue holding on antibiotics -Follow culture results that has been ordered -Repeat CBC in AM.  3-hypertension -Is stable and well-controlled -Continue current antihypertensive agents.  4-mild troponin elevation -Flat in the setting of demand ischemia -Patient denies chest pain -Continue telemetry monitoring and treatment for CHF exacerbation.  5-hyperglycemia -No prior history of diabetes -Continue sliding scale insulin and follow A1c.  6-acute metabolic encephalopathy -In the setting of hypoxia and acute CHF exacerbation -Mentation is back to baseline -Continue supportive care -Wean off oxygen supplementation as tolerated.  DVT prophylaxis: Heparin Code Status: DNR Family Communication: No family at bedside during examination. Disposition:   Status is: Inpatient  Remains inpatient appropriate because:IV treatments appropriate due to intensity of illness or  inability to take PO  Dispo: The patient is from: SNF              Anticipated d/c is to: SNF              Patient currently is not medically stable to d/c.   Difficult to place patient No       Consultants:  Palliative Care  Procedures:  See below for x-ray reports.  Antimicrobials:  None currently.   Subjective: Afebrile, no chest pain, no nausea, no vomiting.  Has not required the use of BiPAP any longer.  On 2 L nasal cannula supplementation currently.  Still short of breath with minimal exertion and complaining of orthopnea.  Objective: Vitals:   10/25/20 0213 10/25/20 0331 10/25/20 0634 10/25/20 1416  BP: 100/62  (!) 105/47 132/68  Pulse: 99  99 (!) 106  Resp: '19  19 18  '$ Temp: 98.5 F (36.9 C)  98.3 F (36.8 C) 98 F (36.7 C)  TempSrc: Oral   Oral  SpO2: 99% 97% 100% 98%  Weight:      Height:        Intake/Output Summary (Last 24 hours) at 10/25/2020 1744 Last data filed at 10/25/2020 1400 Gross per 24 hour  Intake 720 ml  Output 3000 ml  Net -2280 ml   Filed Weights   10/23/20 2121 10/24/20 0236  Weight: 97.5 kg 104.9 kg    Examination:  General exam: Appears calm and comfortable; no chest pain, no nausea or vomiting.  Still short of breath, requiring oxygen supplementation and expressing orthopnea. Respiratory system: Fine crackles at the bases; no using accessory muscle.  Positive scattered rhonchi. Cardiovascular system: Rate controlled, no rubs, no gallops, no JVD.  Positive systolic murmur appreciated on exam. Gastrointestinal system: Abdomen is nondistended, soft and nontender. No organomegaly or masses felt. Normal bowel sounds heard. Central nervous system: Alert and oriented. No focal neurological deficits. Extremities: Trace edema bilaterally; no cyanosis or clubbing. Skin: No petechiae. Psychiatry: Judgement and insight appear normal. Mood & affect appropriate.     Data Reviewed: I have personally reviewed following labs and imaging  studies  CBC: Recent Labs  Lab 10/23/20 2129  WBC 17.4*  NEUTROABS 15.2*  HGB 11.9*  HCT 37.4*  MCV 98.2  PLT [id number]    Basic Metabolic Panel: Recent Labs  Lab 10/23/20 2129 10/24/20 0502  NA 139 139  K 4.5 4.3  CL 97* 96*  CO2 35* 36*  GLUCOSE 238* 103*  BUN 24* 25*  CREATININE 1.02 0.93  CALCIUM 8.8* 8.6*  MG  --  2.2    GFR: Estimated Creatinine Clearance: 70.7 mL/min (by C-G formula based on SCr of 0.93 mg/dL).  Liver Function Tests: Recent Labs  Lab 10/23/20 2129 10/24/20 0502  AST 18 16  ALT 31 26  ALKPHOS 105 89  BILITOT 0.3 0.5  PROT 7.1 6.7  ALBUMIN 3.8 3.4*    CBG: Recent Labs  Lab 10/24/20 1642 10/24/20 2133 10/25/20 0730 10/25/20 1101 10/25/20 1615  GLUCAP 103* 106* 102* 158* 106*     Recent Results (from the past 240 hour(s))  Resp Panel by RT-PCR (Flu A&B, Covid) Nasopharyngeal Swab     Status: None   Collection Time: 10/23/20  9:20 PM   Specimen: Nasopharyngeal Swab; Nasopharyngeal(NP) swabs  in vial transport medium  Result Value Ref Range Status   SARS Coronavirus 2 by RT PCR NEGATIVE NEGATIVE Final    Comment: (NOTE) SARS-CoV-2 target nucleic acids are NOT DETECTED.  The SARS-CoV-2 RNA is generally detectable in upper respiratory specimens during the acute phase of infection. The lowest concentration of SARS-CoV-2 viral copies this assay can detect is 138 copies/mL. A negative result does not preclude SARS-Cov-2 infection and should not be used as the sole basis for treatment or other patient management decisions. A negative result may occur with  improper specimen collection/handling, submission of specimen other than nasopharyngeal swab, presence of viral mutation(s) within the areas targeted by this assay, and inadequate number of viral copies(<138 copies/mL). A negative result must be combined with clinical observations, patient history, and epidemiological information. The expected result is Negative.  Fact Sheet for  Patients:  EntrepreneurPulse.com.au  Fact Sheet for Healthcare Providers:  IncredibleEmployment.be  This test is no t yet approved or cleared by the [place] FDA and  has been authorized for detection and/or diagnosis of SARS-CoV-2 by FDA under an Emergency Use Authorization (EUA). This EUA will remain  in effect (meaning this test can be used) for the duration of the COVID-19 declaration under Section 564(b)(1) of the Act, 21 U.S.C.section 360bbb-3(b)(1), unless the authorization is terminated  or revoked sooner.       Influenza A by PCR NEGATIVE NEGATIVE Final   Influenza B by PCR NEGATIVE NEGATIVE Final    Comment: (NOTE) The Xpert Xpress SARS-CoV-2/FLU/RSV plus assay is intended as an aid in the diagnosis of influenza from Nasopharyngeal swab specimens and should not be used as a sole basis for treatment. Nasal washings and aspirates are unacceptable for Xpert Xpress SARS-CoV-2/FLU/RSV testing.  Fact Sheet for Patients: EntrepreneurPulse.com.au  Fact Sheet for Healthcare Providers: IncredibleEmployment.be  This test is not yet approved or cleared by the [place] FDA and has been authorized for detection and/or diagnosis of SARS-CoV-2 by FDA under an Emergency Use Authorization (EUA). This EUA will remain in effect (meaning this test can be used) for the duration of the COVID-19 declaration under Section 564(b)(1) of the Act, 21 U.S.C. section 360bbb-3(b)(1), unless the authorization is terminated or revoked.  Performed at [facility], [address], [place], [place] [address]   Urine Culture     Status: None   Collection Time: 10/23/20  9:55 PM   Specimen: In/Out Cath Urine  Result Value Ref Range Status   Specimen Description   Final    IN/OUT CATH URINE Performed at [facility], [address] [place], [place] [address]    Special Requests   Final    NONE Performed at [facility], [address], [place], [place] [address]    Culture   Final    NO GROWTH Performed at [facility], [place] [address], [place], [place] [address]    Report Status 10/25/2020 FINAL  Final  Blood culture (routine single)     Status: None (Preliminary result)   Collection Time: 10/23/20 11:26 PM   Specimen: Right Antecubital; Blood  Result Value Ref Range Status   Specimen Description RIGHT ANTECUBITAL  Final   Special Requests   Final    BOTTLES DRAWN AEROBIC AND ANAEROBIC Blood Culture adequate volume   Culture   Final    NO GROWTH 2 DAYS Performed at [facility], [address] [place], [place] [address]    Report Status PENDING  Incomplete  Culture, blood (Routine X  2) w Reflex to ID Panel     Status: None (Preliminary result)   Collection Time: 10/24/20  1:21 AM   Specimen: Right Antecubital  Result Value Ref Range Status   Specimen Description RIGHT ANTECUBITAL  Final   Special Requests   Final    BOTTLES DRAWN AEROBIC AND ANAEROBIC Blood Culture adequate volume   Culture   Final    NO GROWTH 1 DAY Performed at [facility], [address], [place], [place] [address]    Report Status PENDING  Incomplete  MRSA Next Gen by PCR, Nasal     Status: None   Collection Time: 10/24/20  2:32 AM   Specimen: Nasal Mucosa; Nasal Swab  Result Value Ref Range Status   MRSA by PCR Next Gen NOT DETECTED NOT DETECTED Final    Comment: (NOTE) The GeneXpert MRSA Assay (FDA approved for NASAL specimens only), is one component of a comprehensive MRSA colonization surveillance program. It is not intended to diagnose MRSA infection nor to guide or monitor treatment for MRSA infections. Test performance is not FDA approved in patients less than 81 years old. Performed at [facility], [address] [place], [place] [address]      Radiology Studies: [facility] Chest [facility] 1 View  Result Date: 10/25/2020 CLINICAL DATA:  Shortness of breath. EXAM: PORTABLE CHEST 1 VIEW COMPARISON:  10/23/2020.   07/24/2019. FINDINGS: Prior CABG. Stable cardiomegaly. Interim near complete resolution of pulmonary interstitial prominence suggesting resolving interstitial edema. Underlying chronic interstitial disease and pleuroparenchymal thickening again noted. Small bilateral pleural effusions cannot be excluded. No pneumothorax. IMPRESSION: 1. Prior CABG. Stable cardiomegaly. Interim near complete resolution of pulmonary interstitial prominence suggesting resolving interstitial edema. 2. Chronic interstitial disease and pleuroparenchymal thickening again noted. Small bilateral pleural effusions cannot be excluded. Electronically Signed   By: Marcello Moores  Register   On: 10/25/2020 08:17   DG Chest Port 1 View  Result Date: 10/23/2020 CLINICAL DATA:  Respiratory distress. History of prior CABG surgery. EXAM: PORTABLE CHEST 1 VIEW COMPARISON:  10/07/2020 and older exams.  CT, 09/15/2019. FINDINGS: Stable changes from prior CABG surgery. Cardiac silhouette is top-normal in size. No mediastinal or hilar masses. Bilateral vascular congestion and interstitial thickening similar to the most recent prior exam. There is opacity at the left lung base consistent with a pleural effusion, which may be increased compared to the most recent prior study. Possible small right effusion. There is opacity in the peripheral left mid to upper lung associated with pleural thickening consistent with chronic scarring, stable. Surgical vascular clips overlie the left hilum, also stable. No pneumothorax. Skeletal structures are grossly intact. IMPRESSION: 1. Findings support mild congestive heart failure superimposed on chronic findings. Vascular congestion appears increased from the prior study. Left pleural effusion may be larger. Probable small right effusion. 2. Other findings detailed above are chronic and without change from the prior exam. Electronically Signed   By: Lajean Manes M.D.   On: 10/23/2020 21:55     Scheduled Meds:   chlorhexidine  15 mL Mouth Rinse BID   Chlorhexidine Gluconate Cloth  6 each Topical Q0600   furosemide  40 mg Intravenous BID   heparin  5,000 Units Subcutaneous Q8H   insulin aspart  0-5 Units Subcutaneous QHS   insulin aspart  0-9 Units Subcutaneous TID WC   mouth rinse  15 mL Mouth Rinse q12n4p   Continuous Infusions:   LOS: 2 days    Time spent: 35 minutes  Barton Dubois, MD [facility]  To contact the attending provider between 7A-7P or the covering provider during after hours 7P-7A, please log into the web site [email or web address] and access using universal [place] password for that web site. If you do not have the password, please call the hospital operator.  10/25/2020, 5:44 PM

## 2020-10-26 DIAGNOSIS — I5023 Acute on chronic systolic (congestive) heart failure: Secondary | ICD-10-CM

## 2020-10-26 DIAGNOSIS — R778 Other specified abnormalities of plasma proteins: Secondary | ICD-10-CM

## 2020-10-26 LAB — RESP PANEL BY RT-PCR (FLU A&B, COVID) ARPGX2
Influenza A by PCR: NEGATIVE
Influenza B by PCR: NEGATIVE
SARS Coronavirus 2 by RT PCR: NEGATIVE

## 2020-10-26 LAB — BASIC METABOLIC PANEL
Anion gap: 8 (ref 5–15)
BUN: 25 mg/dL — ABNORMAL HIGH (ref 8–23)
CO2: 37 mmol/L — ABNORMAL HIGH (ref 22–32)
Calcium: 8.9 mg/dL (ref 8.9–10.3)
Chloride: 91 mmol/L — ABNORMAL LOW (ref 98–111)
Creatinine, Ser: 0.79 mg/dL (ref 0.61–1.24)
GFR, Estimated: >60 mL/min (ref >60)
Glucose, Bld: 112 mg/dL — ABNORMAL HIGH (ref 70–99)
Potassium: 3.5 mmol/L (ref 3.5–5.1)
Sodium: 136 mmol/L (ref 135–145)

## 2020-10-26 LAB — CBC
HCT: 33.8 % — ABNORMAL LOW (ref 39.0–52.0)
Hemoglobin: 10.7 g/dL — ABNORMAL LOW (ref 13.0–17.0)
MCH: 30.7 pg (ref 26.0–34.0)
MCHC: 31.7 g/dL (ref 30.0–36.0)
MCV: 96.8 fL (ref 80.0–100.0)
Platelets: 179 10*3/uL (ref 150–400)
RBC: 3.49 MIL/uL — ABNORMAL LOW (ref 4.22–5.81)
RDW: 12.7 % (ref 11.5–15.5)
WBC: 7.9 10*3/uL (ref 4.0–10.5)
nRBC: 0 % (ref 0.0–0.2)

## 2020-10-26 LAB — GLUCOSE, CAPILLARY
Glucose-Capillary: 120 mg/dL — ABNORMAL HIGH (ref 70–99)
Glucose-Capillary: 125 mg/dL — ABNORMAL HIGH (ref 70–99)
Glucose-Capillary: 155 mg/dL — ABNORMAL HIGH (ref 70–99)

## 2020-10-26 LAB — PROTIME-INR
INR: 1 (ref 0.8–1.2)
Prothrombin Time: 13.1 seconds (ref 11.4–15.2)

## 2020-10-26 MED ORDER — WARFARIN - PHARMACIST DOSING INPATIENT: Freq: Every day | Status: DC

## 2020-10-26 MED ORDER — ENOXAPARIN SODIUM 100 MG/ML IJ SOSY
100.0000 mg | PREFILLED_SYRINGE | Freq: Two times a day (BID) | INTRAMUSCULAR | Status: DC
  Administered 2020-10-26 – 2020-10-27 (×2): 100 mg via SUBCUTANEOUS
  Filled 2020-10-26 (×2): qty 1

## 2020-10-26 MED ORDER — SPIRONOLACTONE 25 MG PO TABS
12.5000 mg | ORAL_TABLET | Freq: Every day | ORAL | Status: DC
  Filled 2020-10-26 (×3): qty 0.5

## 2020-10-26 MED ORDER — WARFARIN SODIUM 5 MG PO TABS
15.0000 mg | ORAL_TABLET | Freq: Once | ORAL | Status: AC
  Administered 2020-10-26: 15 mg via ORAL
  Filled 2020-10-26: qty 3

## 2020-10-26 MED ORDER — METOPROLOL SUCCINATE ER 25 MG PO TB24
12.5000 mg | ORAL_TABLET | Freq: Every day | ORAL | Status: DC
  Administered 2020-10-26 – 2020-10-27 (×2): 12.5 mg via ORAL
  Filled 2020-10-26 (×2): qty 1

## 2020-10-26 NOTE — Progress Notes (Signed)
PROGRESS NOTE  Benjamin Cordova [id number] DOB: 16-Feb-1935 DOA: 10/23/2020 PCP: Pablo Lawrence, NP  Brief History:   86 y.o. male, with history of coronary artery disease status post CABG, hypertension, stroke, ischemic cardiomyopathy, peripheral vascular disease, and more presents the ED with a chief complaint of hypoxia.  Son at bedside provides the history as patient is altered and BiPAP.  Son reports that patient had been here after a fall that was mechanical.  He used to ambulate with a walker, toppled over to the side with a walker, and had broken ribs.  He was admitted at that time, discharged to the [facility].  Patient then developed pneumonia, and was admitted to [facility].  Today he was going to go to [facility] but they were on diversion so they came to the ED.  In ER patient respiratory distress.  Patient been complaining of shortness of breath since he was diagnosed with pneumonia, and it seems to be constant.  Then today he had sudden onset of worsening shortness of breath, and hypoxia.  Son does report that granddaughter of patient had been to visit him and provided him with a salty burger meal.  He was given a breathing treatment at [facility], and his O2 sats were just were not coming up, so they brought him to the ED.  According to signout, EMS arrived to find patient's oxygen saturation was in the 30s.  To 's knowledge patient has not complained of chest pain.  He is only recently been off antibiotics for pneumonia and UTI.  Apparently since has been back to the [facility] he was only there for 2 days.  Son is unsure what antibiotics were prescribed last time.   In the ED Temp is 98, patient is tachycardic with a heart rate of 107-117, respiratory rate 24-30, blood pressure 124/71, satting at 99% on 100% BiPAP Blood gas shows a pH of 7.260, PCO2 82.4 White blood cell count 17.4, hemoglobin 11.9 Chemistry panel mostly unremarkable aside from a  hyperglycemia at 238 BNP is 386 [place] slightly uptrending from 34-43 UA is borderline, urine culture pending Blood culture pending Chest x-ray shows signs consistent with CHF EKG shows a heart rate of 115, sinus tach, QTC 464 Patient was started on vancomycin, cefepime, Lasix 40 mg IV.  ABx were subsequently stopped on 7/25 and patient remained clinically stable and improving with diuresis    Assessment/Plan: acute  failure with hypoxia  -CHF exacerbation in the setting of diet indiscretion and recent IV therapies. -Continue to follow daily weights, strict I's and O's and low-sodium diet -Continue IV diuresis -Continue to wean off oxygen supplementation as tolerated and follow clinical response. -Patient responding appropriately. -Follow renal function electrolytes closely while providing diuresis  Acute on chronic systolic CHF -[id number] Echo EF 20%, +WMA -continue IV lasix -restart metoprolol succinate  Paroxysmal Afib -restart coumadin -rate controlled -restart metoprolol succinate.   Leukocytosis -Without clear source of infection currently -Continue holding on antibiotics -Follow culture results--neg -Repeat CBC in AM.   Hypertension -Is stable and well-controlled -Continue current antihypertensive agents.  troponin elevation -Flat in the setting of demand ischemia -Patient denies chest pain -Continue telemetry monitoring and treatment for CHF exacerbation.   Hyperglycemia/Impaired glucose tolerance -No prior history of diabetes -10/24/20 A1C--5.5 -DisContinue sliding scale insulin     acute metabolic encephalopathy -In the setting of hypoxia and acute CHF exacerbation -Mentation is back to baseline -Continue supportive care -  Wean off oxygen supplementation as tolerated.      Status is: Inpatient  Remains inpatient appropriate because:IV treatments appropriate due to intensity of illness or inability to take PO  Dispo: The patient is from: SNF               Anticipated d/c is to: SNF              Patient currently is not medically stable to d/c.   Difficult to place patient No        Family Communication:   no Family at bedside  Consultants:  none  Code Status:  FULL   DVT Prophylaxis:  coumadin   Procedures: As Listed in Progress Note Above  Antibiotics: None      Subjective: Patient is breathing better.  Patient denies fevers, chills, headache, chest pain, dyspnea, nausea, vomiting, diarrhea, abdominal pain, dysuria, hematuria, hematochezia, and melena.   Objective: Vitals:   10/26/20 0412 10/26/20 0510 10/26/20 0537 10/26/20 1345  BP: (!) 151/117 111/83  (!) 150/73  Pulse: (!) 104 (!) 105  (!) 106  Resp: '19 20  17  '$ Temp: 97.8 F (36.6 C)   97.8 F (36.6 C)  TempSrc: Oral   Oral  SpO2: 97% 97%  96%  Weight:   106.6 kg   Height:        Intake/Output Summary (Last 24 hours) at 10/26/2020 1635 Last data filed at 10/26/2020 1300 Gross per 24 hour  Intake 630 ml  Output 2875 ml  Net -2245 ml   Weight change:  Exam:  General:  Pt is alert, follows commands appropriately, not in acute distress HEENT: No icterus, No thrush, No neck mass, [place]/AT Cardiovascular: RRR, S1/S2, no rubs, no gallops Respiratory: bibasilar crackles. No wheeze Abdomen: Soft/+BS, non tender, non distended, no guarding Extremities: 1+ LE edema, No lymphangitis, No petechiae, No rashes, no synovitis   Data Reviewed: I have personally reviewed following labs and imaging studies Basic Metabolic Panel: Recent Labs  Lab 10/23/20 2129 10/24/20 0502 10/26/20 0552  NA 139 139 136  K 4.5 4.3 3.5  CL 97* 96* 91*  CO2 35* 36* 37*  GLUCOSE 238* 103* 112*  BUN 24* 25* 25*  CREATININE 1.02 0.93 0.79  CALCIUM 8.8* 8.6* 8.9  MG  --  2.2  --    Liver Function Tests: Recent Labs  Lab 10/23/20 2129 10/24/20 0502  AST 18 16  ALT 31 26  ALKPHOS 105 89  BILITOT 0.3 0.5  PROT 7.1 6.7  ALBUMIN 3.8 3.4*   No results for input(s):  LIPASE, AMYLASE in the last 168 hours. No results for input(s): AMMONIA in the last 168 hours. Coagulation Profile: Recent Labs  Lab 10/23/20 2129  INR 1.1   CBC: Recent Labs  Lab 10/23/20 2129 10/26/20 0552  WBC 17.4* 7.9  NEUTROABS 15.2*  --   HGB 11.9* 10.7*  HCT 37.4* 33.8*  MCV 98.2 96.8  PLT 301 179   Cardiac Enzymes: No results for input(s): CKTOTAL, CKMB, CKMBINDEX, TROPONINI in the last 168 hours. BNP: Invalid input(s): POCBNP CBG: Recent Labs  Lab 10/25/20 1615 10/25/20 2139 10/26/20 0749 10/26/20 1116 10/26/20 1613  GLUCAP 106* 124* 120* 125* 155*   HbA1C: Recent Labs    10/24/20 0121  HGBA1C 5.5   Urine analysis:    Component Value Date/Time   COLORURINE YELLOW 10/23/2020 2155   APPEARANCEUR CLEAR 10/23/2020 2155   LABSPEC 1.012 10/23/2020 2155   PHURINE 5.0 10/23/2020 2155   GLUCOSEU  NEGATIVE 10/23/2020 2155   HGBUR NEGATIVE 10/23/2020 2155   BILIRUBINUR NEGATIVE 10/23/2020 2155   KETONESUR NEGATIVE 10/23/2020 2155   PROTEINUR NEGATIVE 10/23/2020 2155   UROBILINOGEN 1.0 10/07/2014 1638   NITRITE NEGATIVE 10/23/2020 2155   LEUKOCYTESUR TRACE (A) 10/23/2020 2155   Sepsis Labs: '@LABRCNTIP'$ (procalcitonin:4,lacticidven:4) ) Recent Results (from the past 240 hour(s))  Resp Panel by RT-PCR (Flu A&B, Covid) Nasopharyngeal Swab     Status: None   Collection Time: 10/23/20  9:20 PM   Specimen: Nasopharyngeal Swab; Nasopharyngeal(NP) swabs in vial transport medium  Result Value Ref Range Status   SARS Coronavirus 2 by RT PCR NEGATIVE NEGATIVE Final    Comment: (NOTE) SARS-CoV-2 target nucleic acids are NOT DETECTED.  The SARS-CoV-2 RNA is generally detectable in upper respiratory specimens during the acute phase of infection. The lowest concentration of SARS-CoV-2 viral copies this assay can detect is 138 copies/mL. A negative result does not preclude SARS-Cov-2 infection and should not be used as the sole basis for treatment or other patient  management decisions. A negative result may occur with  improper specimen collection/handling, submission of specimen other than nasopharyngeal swab, presence of viral mutation(s) within the areas targeted by this assay, and inadequate number of viral copies(<138 copies/mL). A negative result must be combined with clinical observations, patient history, and epidemiological information. The expected result is Negative.  Fact Sheet for Patients:  EntrepreneurPulse.com.au  Fact Sheet for Healthcare Providers:  IncredibleEmployment.be  This test is no t yet approved or cleared by the [place] FDA and  has been authorized for detection and/or diagnosis of SARS-CoV-2 by FDA under an Emergency Use Authorization (EUA). This EUA will remain  in effect (meaning this test can be used) for the duration of the COVID-19 declaration under Section 564(b)(1) of the Act, 21 U.S.C.section 360bbb-3(b)(1), unless the authorization is terminated  or revoked sooner.       Influenza A by PCR NEGATIVE NEGATIVE Final   Influenza B by PCR NEGATIVE NEGATIVE Final    Comment: (NOTE) The Xpert Xpress SARS-CoV-2/FLU/RSV plus assay is intended as an aid in the diagnosis of influenza from Nasopharyngeal swab specimens and should not be used as a sole basis for treatment. Nasal washings and aspirates are unacceptable for Xpert Xpress SARS-CoV-2/FLU/RSV testing.  Fact Sheet for Patients: EntrepreneurPulse.com.au  Fact Sheet for Healthcare Providers: IncredibleEmployment.be  This test is not yet approved or cleared by the [place] FDA and has been authorized for detection and/or diagnosis of SARS-CoV-2 by FDA under an Emergency Use Authorization (EUA). This EUA will remain in effect (meaning this test can be used) for the duration of the COVID-19 declaration under Section 564(b)(1) of the Act, 21 U.S.C. section 360bbb-3(b)(1),  unless the authorization is terminated or revoked.  Performed at [facility], [address], [place], [place] [address]   Urine Culture     Status: None   Collection Time: 10/23/20  9:55 PM   Specimen: In/Out Cath Urine  Result Value Ref Range Status   Specimen Description   Final    IN/OUT CATH URINE Performed at [facility], [address], [place], [place] [address]    Special Requests   Final    NONE Performed at [facility], [address], [place], [place] [address]    Culture   Final    NO GROWTH Performed at [facility], [place] [address], [place], [place] [address]    Report Status 10/25/2020 FINAL  Final  Blood culture (routine single)  Status: None (Preliminary result)   Collection Time: 10/23/20 11:26 PM   Specimen: Right Antecubital; Blood  Result Value Ref Range Status   Specimen Description RIGHT ANTECUBITAL  Final   Special Requests   Final    BOTTLES DRAWN AEROBIC AND ANAEROBIC Blood Culture adequate volume   Culture   Final    NO GROWTH 3 DAYS Performed at [facility], [address], [place], [place] [address]    Report Status PENDING  Incomplete  Culture, blood (Routine X 2) w Reflex to ID Panel     Status: None (Preliminary result)   Collection Time: 10/24/20  1:21 AM   Specimen: Right Antecubital  Result Value Ref Range Status   Specimen Description RIGHT ANTECUBITAL  Final   Special Requests   Final    BOTTLES DRAWN AEROBIC AND ANAEROBIC Blood Culture adequate volume   Culture   Final    NO GROWTH 2 DAYS Performed at [facility], [address], [place], [place] [address]    Report Status PENDING  Incomplete  MRSA Next Gen by PCR, Nasal     Status: None   Collection Time: 10/24/20  2:32 AM   Specimen: Nasal Mucosa; Nasal Swab  Result Value Ref Range Status   MRSA by PCR Next Gen NOT DETECTED NOT DETECTED Final    Comment: (NOTE) The GeneXpert MRSA Assay (FDA approved for NASAL specimens only), is one component of a comprehensive MRSA  colonization surveillance program. It is not intended to diagnose MRSA infection nor to guide or monitor treatment for MRSA infections. Test performance is not FDA approved in patients less than 15 years old. Performed at [facility], [address] [place], [place] [address]   Resp Panel by RT-PCR (Flu A&B, Covid) Nasopharyngeal Swab     Status: None   Collection Time: 10/26/20 11:25 AM   Specimen: Nasopharyngeal Swab; Nasopharyngeal(NP) swabs in vial transport medium  Result Value Ref Range Status   SARS Coronavirus 2 by RT PCR NEGATIVE NEGATIVE Final    Comment: (NOTE) SARS-CoV-2 target nucleic acids are NOT DETECTED.  The SARS-CoV-2 RNA is generally detectable in upper respiratory specimens during the acute phase of infection. The lowest concentration of SARS-CoV-2 viral copies this assay can detect is 138 copies/mL. A negative result does not preclude SARS-Cov-2 infection and should not be used as the sole basis for treatment or other patient management decisions. A negative result may occur with  improper specimen collection/handling, submission of specimen other than nasopharyngeal swab, presence of viral mutation(s) within the areas targeted by this assay, and inadequate number of viral copies(<138 copies/mL). A negative result must be combined with clinical observations, patient history, and epidemiological information. The expected result is Negative.  Fact Sheet for Patients:  EntrepreneurPulse.com.au  Fact Sheet for Healthcare Providers:  IncredibleEmployment.be  This test is no t yet approved or cleared by the [place] FDA and  has been authorized for detection and/or diagnosis of SARS-CoV-2 by FDA under an Emergency Use Authorization (EUA). This EUA will remain  in effect (meaning this test can be used) for the duration of the COVID-19 declaration under Section 564(b)(1) of the Act, 21 U.S.C.section 360bbb-3(b)(1), unless  the authorization is terminated  or revoked sooner.       Influenza A by PCR NEGATIVE NEGATIVE Final   Influenza B by PCR NEGATIVE NEGATIVE Final    Comment: (NOTE) The Xpert Xpress SARS-CoV-2/FLU/RSV plus assay is intended as an aid in the diagnosis of influenza from Nasopharyngeal swab specimens  and should not be used as a sole basis for treatment. Nasal washings and aspirates are unacceptable for Xpert Xpress SARS-CoV-2/FLU/RSV testing.  Fact Sheet for Patients: EntrepreneurPulse.com.au  Fact Sheet for Healthcare Providers: IncredibleEmployment.be  This test is not yet approved or cleared by the [place] FDA and has been authorized for detection and/or diagnosis of SARS-CoV-2 by FDA under an Emergency Use Authorization (EUA). This EUA will remain in effect (meaning this test can be used) for the duration of the COVID-19 declaration under Section 564(b)(1) of the Act, 21 U.S.C. section 360bbb-3(b)(1), unless the authorization is terminated or revoked.  Performed at [facility], [address] [place], [place] [address]      Scheduled Meds:  chlorhexidine  15 mL Mouth Rinse BID   Chlorhexidine Gluconate Cloth  6 each Topical Q0600   furosemide  40 mg Intravenous BID   heparin  5,000 Units Subcutaneous Q8H   insulin aspart  0-5 Units Subcutaneous QHS   insulin aspart  0-9 Units Subcutaneous TID WC   mouth rinse  15 mL Mouth Rinse q12n4p   metoprolol succinate  12.5 mg Oral Daily   Continuous Infusions:  Procedures/Studies: DG Ribs Unilateral W/Chest Right  Result Date: 10/05/2020 CLINICAL DATA:  Right chest pain after a fall today. Initial encounter. EXAM: RIGHT RIBS AND CHEST - 3+ VIEW COMPARISON:  CT chest and single view of the chest 09/15/2019. FINDINGS: The patient has acute fractures of right sixth and seventh ribs. The right sixth rib fracture is nondisplaced. There is mild superior displacement of the distal fragment of  the seventh rib. Small left pleural effusion and chronic basilar atelectasis appear unchanged. Heart size is upper normal. The patient is status post CABG. No pneumothorax is identified. IMPRESSION: Acute right sixth and seventh rib fractures. Negative for pneumothorax. No change in a small left pleural effusion and basilar atelectasis. Electronically Signed   By: Inge Rise M.D.   On: 10/05/2020 14:14   DG Shoulder Right  Result Date: 10/05/2020 CLINICAL DATA:  Status post fall.  Right shoulder pain. EXAM: RIGHT SHOULDER - 2+ VIEW COMPARISON:  None. FINDINGS: There is no evidence of fracture or dislocation. Mild degenerative changes noted at the acromioclavicular joint. There is an acute fracture involving the posterolateral aspect of the approximate right seventh rib. Soft tissues are unremarkable. IMPRESSION: Acute fracture involves the posterolateral aspect of the approximate right seventh rib. No signs of shoulder fracture. Electronically Signed   By: Kerby Moors M.D.   On: 10/05/2020 13:44   DG Chest Port 1 View  Result Date: 10/25/2020 CLINICAL DATA:  Shortness of breath. EXAM: PORTABLE CHEST 1 VIEW COMPARISON:  10/23/2020.  07/24/2019. FINDINGS: Prior CABG. Stable cardiomegaly. Interim near complete resolution of pulmonary interstitial prominence suggesting resolving interstitial edema. Underlying chronic interstitial disease and pleuroparenchymal thickening again noted. Small bilateral pleural effusions cannot be excluded. No pneumothorax. IMPRESSION: 1. Prior CABG. Stable cardiomegaly. Interim near complete resolution of pulmonary interstitial prominence suggesting resolving interstitial edema. 2. Chronic interstitial disease and pleuroparenchymal thickening again noted. Small bilateral pleural effusions cannot be excluded. Electronically Signed   By: Marcello Moores  Register   On: 10/25/2020 08:17   DG Chest Port 1 View  Result Date: 10/23/2020 CLINICAL DATA:  Respiratory distress. History of  prior CABG surgery. EXAM: PORTABLE CHEST 1 VIEW COMPARISON:  10/07/2020 and older exams.  CT, 09/15/2019. FINDINGS: Stable changes from prior CABG surgery. Cardiac silhouette is top-normal in size. No mediastinal or hilar masses. Bilateral vascular congestion and interstitial thickening similar  to the most recent prior exam. There is opacity at the left lung base consistent with a pleural effusion, which may be increased compared to the most recent prior study. Possible small right effusion. There is opacity in the peripheral left mid to upper lung associated with pleural thickening consistent with chronic scarring, stable. Surgical vascular clips overlie the left hilum, also stable. No pneumothorax. Skeletal structures are grossly intact. IMPRESSION: 1. Findings support mild congestive heart failure superimposed on chronic findings. Vascular congestion appears increased from the prior study. Left pleural effusion may be larger. Probable small right effusion. 2. Other findings detailed above are chronic and without change from the prior exam. Electronically Signed   By: Lajean Manes M.D.   On: 10/23/2020 21:55   DG CHEST PORT 1 VIEW  Result Date: 10/07/2020 CLINICAL DATA:  86 year old male with history of dyspnea and multiple right-sided rib fractures. EXAM: PORTABLE CHEST 1 VIEW COMPARISON:  Chest x-ray 10/06/2020. FINDINGS: Low lung volumes. Chronic areas of pleuroparenchymal thickening and architectural distortion and chronic left pleural effusion, similar to prior examinations. Opacity at the left base and in the left mid lung periphery corresponds to areas of chronic rounded atelectasis on prior chest CT. Right lung is clear. No right pleural effusion. No pneumothorax. No evidence of pulmonary edema. Heart size is normal. Upper mediastinal contours are within normal limits. Aortic atherosclerosis. Status post median sternotomy. Minimally displaced fractures of the lateral aspects of the right sixth, seventh  and eighth ribs again noted. IMPRESSION: 1. Acute minimally displaced fractures of the right sixth, seventh and eighth ribs. No right-sided pneumothorax. 2. Low lung volumes with chronic areas of rounded atelectasis in the left lung and chronic small left pleural effusion, similar to prior examinations. 3. Aortic atherosclerosis. Electronically Signed   By: Vinnie Langton M.D.   On: 10/07/2020 05:31   DG CHEST PORT 1 VIEW  Result Date: 10/06/2020 CLINICAL DATA:  Shortness of breath.  Known rib fractures. EXAM: PORTABLE CHEST 1 VIEW COMPARISON:  Chest x-ray 10/05/2020 FINDINGS: Chronic left pleural effusion and left lower lobe atelectasis with the appearance of rounded atelectasis on the prior chest CT. Chronic scarring changes involving the right lung. No definite acute pulmonary findings. Stable right-sided rib fractures but no pneumothorax or pleural effusion. IMPRESSION: 1. Stable chronic left pleural effusion and left lower lobe atelectasis. 2. Stable right-sided rib fractures. Electronically Signed   By: Marijo Sanes M.D.   On: 10/06/2020 15:37   ECHOCARDIOGRAM COMPLETE  Result Date: 10/07/2020    ECHOCARDIOGRAM REPORT   Patient Name:   ARIF STOLTMAN Date of Exam: 10/07/2020 Medical Rec #:  [id number]       Height:       68.0 in Accession #:    [id number]      Weight:       233.0 lb Date of Birth:  1934/04/26       BSA:          2.181 m Patient Age:    11 years        BP:           95/56 mmHg Patient Gender: M               HR:           85 bpm. Exam Location:  [facility] Procedure: 2D Echo, Cardiac Doppler and Color Doppler Indications:    Dyspnea  History:        Patient has prior history of Echocardiogram examinations,  most                 recent 07/01/2019. CHF, CAD, Prior CABG, Arrythmias:Atrial                 Fibrillation, Signs/Symptoms:Shortness of Breath; Risk                 Factors:Hypertension and Former Smoker.  Sonographer:    Wenda Low Referring Phys: [place] Comments: Patient refused the use of Definity. IMPRESSIONS  1. LVEF is severely depressed with severe hypokineis of the mid/distal ventricle; distal anterior, dsital inferior and apical akinesis. Note apex is not well seen in this bedside study COmpared to 2021 no significant change . Left ventricular ejection fraction, by estimation, is 20%%. The left ventricular internal cavity size was severely dilated. Left ventricular diastolic parameters are indeterminate.  2. Right ventricular systolic function is normal. The right ventricular size is normal. There is normal pulmonary artery systolic pressure.  3. Trivial mitral valve regurgitation.  4. The aortic valve is tricuspid. Aortic valve regurgitation is not visualized. Mild aortic valve sclerosis is present, with no evidence of aortic valve stenosis. FINDINGS  Left Ventricle: LVEF is severely depressed with severe hypokineis of the mid/distal ventricle; distal anterior, dsital inferior and apical akinesis. Note apex is not well seen in this bedside study COmpared to 2021 no significant change. Left ventricular ejection fraction, by estimation, is 20%%. The left ventricular internal cavity size was severely dilated. There is no left ventricular hypertrophy. Left ventricular diastolic parameters are indeterminate. Right Ventricle: The right ventricular size is normal. Right vetricular wall thickness was not assessed. Right ventricular systolic function is normal. There is normal pulmonary artery systolic pressure. The tricuspid regurgitant velocity is 1.72 m/s, and with an assumed right atrial pressure of 3 mmHg, the estimated right ventricular systolic pressure is [id number] mmHg. Left Atrium: Left atrial size was normal in size. Right Atrium: Right atrial size was normal in size. Pericardium: Trivial pericardial effusion is present. Mitral Valve: There is mild thickening of the mitral valve leaflet(s). Trivial mitral valve regurgitation. MV peak gradient, 5.6  mmHg. The mean mitral valve gradient is 2.0 mmHg. Tricuspid Valve: The tricuspid valve is normal in structure. Tricuspid valve regurgitation is trivial. Aortic Valve: The aortic valve is tricuspid. Aortic valve regurgitation is not visualized. Mild aortic valve sclerosis is present, with no evidence of aortic valve stenosis. Aortic valve mean gradient measures 3.0 mmHg. Aortic valve peak gradient measures 7.1 mmHg. Aortic valve area, by VTI measures 3.10 cm. Pulmonic Valve: The pulmonic valve was normal in structure. Pulmonic valve regurgitation is not visualized. Aorta: The aortic root is normal in size and structure. IAS/Shunts: No atrial level shunt detected by color flow Doppler.  LEFT VENTRICLE PLAX 2D LVIDd:         6.31 cm  Diastology LVIDs:         5.80 cm  LV e' medial:    12.60 cm/s LV PW:         1.04 cm  LV E/e' medial:  7.5 LV IVS:        1.13 cm  LV e' lateral:   13.20 cm/s LVOT diam:     2.10 cm  LV E/e' lateral: 7.2 LV SV:         69 LV SV Index:   32 LVOT Area:     3.46 cm  RIGHT VENTRICLE RV Basal diam:  3.62 cm TAPSE (M-mode): 2.4 cm LEFT  ATRIUM             Index       RIGHT ATRIUM           Index LA diam:        4.50 cm 2.06 cm/m  RA Area:     15.40 cm LA Vol (A2C):   66.7 ml 30.58 ml/m RA Volume:   34.80 ml  15.96 ml/m LA Vol (A4C):   65.5 ml 30.03 ml/m LA Biplane Vol: 67.7 ml 31.04 ml/m  AORTIC VALVE AV Area (Vmax):    2.73 cm AV Area (Vmean):   2.79 cm AV Area (VTI):     3.10 cm AV Vmax:           133.00 cm/s AV Vmean:          78.800 cm/s AV VTI:            0.222 m AV Peak Grad:      7.1 mmHg AV Mean Grad:      3.0 mmHg LVOT Vmax:         105.00 cm/s LVOT Vmean:        63.500 cm/s LVOT VTI:          0.199 m LVOT/AV VTI ratio: 0.90  AORTA Ao Root diam: 3.40 cm Ao Asc diam:  3.10 cm MITRAL VALVE               TRICUSPID VALVE MV Area (PHT): 5.97 cm    TR Peak grad:   11.8 mmHg MV Area VTI:   3.57 cm    TR Vmax:        172.00 cm/s MV Peak grad:  5.6 mmHg MV Mean grad:  2.0 mmHg     SHUNTS MV Vmax:       1.18 m/s    Systemic VTI:  0.20 m MV Vmean:      60.6 cm/s   Systemic Diam: 2.10 cm MV Decel Time: 127 msec MV E velocity: 94.60 cm/s Dorris Carnes MD Electronically signed by Dorris Carnes MD Signature Date/Time: 10/07/2020/4:22:38 PM    Final     Orson Eva, DO  [facility]  If 7PM-7AM, please contact night-coverage [email or web address] Password [facility] 10/26/2020, 4:35 PM   LOS: 3 days

## 2020-10-26 NOTE — Progress Notes (Signed)
ANTICOAGULATION CONSULT NOTE - Initial Consult  Pharmacy Consult for warfarin Indication: atrial fibrillation  No Known Allergies  Patient Measurements: Height: '5\' 8"'$  (172.7 cm) Weight: 106.6 kg (235 lb 0.2 oz) IBW/kg (Calculated) : 68.4 Heparin Dosing Weight: 91.3 kg  Vital Signs: Temp: 97.8 F (36.6 C) (07/27 1345) Temp Source: Oral (07/27 1345) BP: 150/73 (07/27 1345) Pulse Rate: 106 (07/27 1345)  Labs: Recent Labs    10/23/20 2129 10/23/20 2326 10/24/20 0502 10/24/20 0712 10/26/20 0552 10/26/20 1839  HGB 11.9*  --   --   --  10.7*  --   HCT 37.4*  --   --   --  33.8*  --   PLT 301  --   --   --  179  --   APTT  --  32  --   --   --   --   LABPROT 14.3  --   --   --   --  13.1  INR 1.1  --   --   --   --  1.0  CREATININE 1.02  --  0.93  --  0.79  --   TROPONINIHS 34* 43* 60* 56*  --   --     Estimated Creatinine Clearance: 82.8 mL/min (by C-G formula based on SCr of 0.79 mg/dL).   Medical History: Past Medical History:  Diagnosis Date   Arthritis    Coronary artery disease    a. s/p CABG in 1981 b. cath in 10/2014 showing occluded grafts --> redo CABG in 2016 LIMA-LAD and Left Radial-OM2   Essential hypertension    Hematuria    History of blood transfusion 07/2015   Knee replacement HgB <9   History of pneumonia    History of stroke    Ischemic cardiomyopathy    Left ventricular apical thrombus    Mixed hyperlipidemia    Myocardial infarction [facility]) 1980   Peripheral vascular disease ([place])    Polio osteopathy of lower leg (HCC) Age 40   Affected right leg   Skin cancer    TIA (transient ischemic attack) 1989   Chronic coumadin   UTI (lower urinary tract infection)     Medications:  Medications Prior to Admission  Medication Sig Dispense Refill Last Dose   acetaminophen (TYLENOL) 325 MG tablet Take 2 tablets (650 mg total) by mouth every 6 (six) hours.   PRN   albuterol (PROVENTIL) (2.5 MG/3ML) 0.083% nebulizer solution Take 2.5 mg by  nebulization every 6 (six) hours as needed for wheezing or shortness of breath.    PRN   ascorbic acid (VITAMIN C) 500 MG tablet Take 1,000 mg by mouth 2 (two) times daily.   10/23/2020   diphenhydrAMINE (BENADRYL) 50 MG capsule Take 1 capsule (50 mg total) by mouth at bedtime as needed for sleep. 30 capsule 0 PRN   finasteride (PROSCAR) 5 MG tablet Take 5 mg by mouth daily.   99991111   folic acid (FOLVITE) [id number] MCG tablet Take 800 mcg by mouth daily.   10/23/2020   furosemide (LASIX) 40 MG tablet Take 1 tablet (40 mg total) by mouth daily. 30 tablet  10/23/2020   ipratropium-albuterol (DUONEB) 0.5-2.5 (3) MG/3ML SOLN Take 3 mLs by nebulization every 4 (four) hours as needed (SOB/wheezing).   10/23/2020   metoprolol succinate (TOPROL-XL) 25 MG 24 hr tablet Take 0.5 tablets (12.5 mg total) by mouth daily. (Patient taking differently: Take 25 mg by mouth daily.)   10/23/2020 at 0600   Multiple Vitamins-Minerals (  CERTAVITE/ANTIOXIDANTS) TABS Take 1 tablet by mouth daily.    10/23/2020   oxyCODONE (OXY IR/ROXICODONE) 5 MG immediate release tablet Take 1 tablet (5 mg total) by mouth every 6 (six) hours as needed for severe pain. 20 tablet 0 10/13/2020   potassium chloride (K-DUR) 10 MEQ tablet Take 10 mEq by mouth daily. Take with Lasix (Furosemide)   10/23/2020   senna-docusate (SENOKOT-S) 8.6-50 MG tablet Take 1 tablet by mouth at bedtime.   10/22/2020   simvastatin (ZOCOR) 40 MG tablet Take 40 mg by mouth at bedtime.    10/22/2020   spironolactone (ALDACTONE) 25 MG tablet Take 0.5 tablets (12.5 mg total) by mouth daily. 45 tablet 3 10/23/2020   tamsulosin (FLOMAX) 0.4 MG CAPS capsule Take 0.4 mg by mouth in the morning.    10/23/2020   warfarin (COUMADIN) 5 MG tablet Take 1.5 tablets (7.5 mg total) by mouth daily at 6 PM. Take 10 mg every night except Friday take 5 mg (Patient taking differently: Take 10 mg by mouth daily at 6 PM.)   10/23/2020 at 1600    Assessment: Benjamin Cordova is an 86 yoM with history of  Afib on chronic anticoagulation with warfarin. Pharmacy consulted to restart warfarin and start a Lovenox bridge. Patients last dose per PTA med list was 7/24. Unsure of compliance as INR here is subtherapeutic at 1. Patient has been receiving prophylactic subcutaneous heparin 5000 units every 8 hours, last dose given at 1405.  PTA warfarin dose documented as 10 mg daily  Goal of Therapy:  INR 2-3 Monitor platelets by anticoagulation protocol: Yes    Plan:  Give warfarin 15 mg po x 1 Start Lovenox 100 mg (1 mg/kg) subcutaneously every 12 hours until INR therapeutic x 2 days Monitor daily INR, CBC, clinical course, s/sx of bleed, PO intake/diet, Drug-Drug Interactions    Thank you for allowing [place] to participate in this patients care. Jens Som, PharmD 10/26/2020 8:07 PM  Please check [email or web address] for unit-specific pharmacy phone numbers.

## 2020-10-27 DIAGNOSIS — J9601 Acute respiratory failure with hypoxia: Secondary | ICD-10-CM

## 2020-10-27 DIAGNOSIS — I5023 Acute on chronic systolic (congestive) heart failure: Secondary | ICD-10-CM

## 2020-10-27 LAB — GLUCOSE, CAPILLARY
Glucose-Capillary: 103 mg/dL — ABNORMAL HIGH (ref 70–99)
Glucose-Capillary: 147 mg/dL — ABNORMAL HIGH (ref 70–99)
Glucose-Capillary: 127 mg/dL — ABNORMAL HIGH (ref 70–99)
Glucose-Capillary: 141 mg/dL — ABNORMAL HIGH (ref 70–99)

## 2020-10-27 LAB — BASIC METABOLIC PANEL
Anion gap: 10 (ref 5–15)
BUN: 23 mg/dL (ref 8–23)
CO2: 38 mmol/L — ABNORMAL HIGH (ref 22–32)
Calcium: 9.2 mg/dL (ref 8.9–10.3)
Chloride: 89 mmol/L — ABNORMAL LOW (ref 98–111)
Creatinine, Ser: 0.8 mg/dL (ref 0.61–1.24)
GFR, Estimated: >60 mL/min (ref >60)
Glucose, Bld: 110 mg/dL — ABNORMAL HIGH (ref 70–99)
Potassium: 3.4 mmol/L — ABNORMAL LOW (ref 3.5–5.1)
Sodium: 137 mmol/L (ref 135–145)

## 2020-10-27 LAB — CBC
HCT: 36.6 % — ABNORMAL LOW (ref 39.0–52.0)
Hemoglobin: 11.7 g/dL — ABNORMAL LOW (ref 13.0–17.0)
MCH: 31 pg (ref 26.0–34.0)
MCHC: 32 g/dL (ref 30.0–36.0)
MCV: 96.8 fL (ref 80.0–100.0)
Platelets: 212 10*3/uL (ref 150–400)
RBC: 3.78 MIL/uL — ABNORMAL LOW (ref 4.22–5.81)
RDW: 12.5 % (ref 11.5–15.5)
WBC: 10.3 10*3/uL (ref 4.0–10.5)
nRBC: 0 % (ref 0.0–0.2)

## 2020-10-27 LAB — PROTIME-INR
INR: 1.1 (ref 0.8–1.2)
Prothrombin Time: 14.1 seconds (ref 11.4–15.2)

## 2020-10-27 LAB — MAGNESIUM: Magnesium: 2.3 mg/dL (ref 1.7–2.4)

## 2020-10-27 MED ORDER — METOPROLOL SUCCINATE ER 25 MG PO TB24
12.5000 mg | ORAL_TABLET | Freq: Once | ORAL | Status: AC
  Administered 2020-10-27: 12.5 mg via ORAL
  Filled 2020-10-27: qty 1

## 2020-10-27 MED ORDER — POTASSIUM CHLORIDE CRYS ER 20 MEQ PO TBCR
20.0000 meq | EXTENDED_RELEASE_TABLET | Freq: Once | ORAL | Status: AC
  Administered 2020-10-27: 20 meq via ORAL
  Filled 2020-10-27: qty 1

## 2020-10-27 MED ORDER — SPIRONOLACTONE 25 MG PO TABS
12.5000 mg | ORAL_TABLET | Freq: Every day | ORAL | Status: DC
  Administered 2020-10-27 – 2020-10-28 (×2): 12.5 mg via ORAL
  Filled 2020-10-27 (×2): qty 0.5
  Filled 2020-10-27: qty 1
  Filled 2020-10-27 (×3): qty 0.5

## 2020-10-27 MED ORDER — METOPROLOL SUCCINATE ER 25 MG PO TB24
25.0000 mg | ORAL_TABLET | Freq: Every day | ORAL | Status: DC
  Administered 2020-10-28: 25 mg via ORAL
  Filled 2020-10-27: qty 1

## 2020-10-27 MED ORDER — WARFARIN SODIUM 5 MG PO TABS
10.0000 mg | ORAL_TABLET | Freq: Once | ORAL | Status: AC
  Administered 2020-10-27: 10 mg via ORAL
  Filled 2020-10-27: qty 2

## 2020-10-27 NOTE — Progress Notes (Addendum)
PROGRESS NOTE  Benjamin Cordova [id number] DOB: 1934-12-19 DOA: 10/23/2020 PCP: Pablo Lawrence, NP Brief History:   86 y.o. male, with history of coronary artery disease status post CABG, hypertension, stroke, ischemic cardiomyopathy, peripheral vascular disease, and more presents the ED with a chief complaint of hypoxia.  Son at bedside provides the history as patient is altered and BiPAP.  Son reports that patient had been here after a fall that was mechanical.  He used to ambulate with a walker, toppled over to the side with a walker, and had broken ribs.  He was admitted at that time, discharged to the [facility].  Patient then developed pneumonia, and was admitted to [facility].  Today he was going to go to [facility] but they were on diversion so they came to the ED.  In ER patient respiratory distress.  Patient been complaining of shortness of breath since he was diagnosed with pneumonia, and it seems to be constant.  Then today he had sudden onset of worsening shortness of breath, and hypoxia.  Son does report that granddaughter of patient had been to visit him and provided him with a salty burger meal.  He was given a breathing treatment at [facility], and his O2 sats were just were not coming up, so they brought him to the ED.  According to signout, EMS arrived to find patient's oxygen saturation was in the 30s.  To 's knowledge patient has not complained of chest pain.  He is only recently been off antibiotics for pneumonia and UTI.  Apparently since has been back to the [facility] he was only there for 2 days.  Son is unsure what antibiotics were prescribed last time.   In the ED Temp is 98, patient is tachycardic with a heart rate of 107-117, respiratory rate 24-30, blood pressure 124/71, satting at 99% on 100% BiPAP Blood gas shows a pH of 7.260, PCO2 82.4 White blood cell count 17.4, hemoglobin 11.9 Chemistry panel mostly unremarkable aside from a hyperglycemia  at 238 BNP is 386 Trope slightly uptrending from 34-43 UA is borderline, urine culture pending Blood culture pending Chest x-ray shows signs consistent with CHF EKG shows a heart rate of 115, sinus tach, QTC 464 Patient was started on vancomycin, cefepime, Lasix 40 mg IV.  ABx were subsequently stopped on 7/25 and patient remained clinically stable and improving with diuresis     Assessment/Plan: acute repiratory failure with hypoxia -CHF exacerbation in the setting of diet indiscretion -Continue to follow daily weights, strict I's and O's and low-sodium diet -Continue IV diuresis -Continue to wean off oxygen supplementation as tolerated and follow clinical response. -Patient responding appropriately. -Follow renal function electrolytes closely while providing diuresis -initially on 4L>>>1L   Acute on chronic systolic CHF -[id number] Echo EF 20%, +WMA -continue IV lasix -restart metoprolol succinate and spironolactone   Paroxysmal Afib -restart coumadin -rate controlled -restart metoprolol succinate.--increase to 25 mg   Leukocytosis -Without clear source of infection currently -Continue holding on antibiotics -Follow culture results--neg -likely stress demargination -Repeat CBC in AM.   Hypertension -stable and well-controlled -Continue current metoprolol and spironolactone   troponin elevation -Flat in the setting of demand ischemia -Patient denies chest pain -Continue telemetry monitoring and treatment for CHF exacerbation.   Hyperglycemia/Impaired glucose tolerance -No prior history of diabetes -10/24/20 A1C--5.5 -DisContinue sliding scale insulin   acute metabolic encephalopathy -In the setting of hypoxia and acute CHF exacerbation -Mentation  is back to baseline -Continue supportive care -Wean off oxygen supplementation as tolerated.  Obesity -BMI 34.96 noted on 10/27/20 -lifestyle modification           Status is: Inpatient   Remains inpatient  appropriate because:IV treatments appropriate due to intensity of illness or inability to take PO   Dispo: The patient is from: SNF              Anticipated d/c is to: SNF              Patient currently is not medically stable to d/c.              Difficult to place patient No               Family Communication:   no Family at bedside   Consultants:  none   Code Status:  FULL   DVT Prophylaxis:  coumadin     Procedures: As Listed in Progress Note Above   Antibiotics: None       Subjective: Patient denies fevers, chills, headache, chest pain, dyspnea, nausea, vomiting, diarrhea, abdominal pain, dysuria, hematuria, hematochezia, and melena.   Objective: Vitals:   10/26/20 0537 10/26/20 1345 10/26/20 2120 10/27/20 0444  BP:  (!) 150/73 101/68 118/62  Pulse:  (!) 106 99 (!) 104  Resp:  '17 17 17  '$ Temp:  97.8 F (36.6 C) 98.1 F (36.7 C) 98.2 F (36.8 C)  TempSrc:  Oral Oral Oral  SpO2:  96% 94% 96%  Weight: 106.6 kg   104.3 kg  Height:        Intake/Output Summary (Last 24 hours) at 10/27/2020 1212 Last data filed at 10/26/2020 1847 Gross per 24 hour  Intake 480 ml  Output 1200 ml  Net -720 ml   Weight change: -2.3 kg Exam:  General:  Pt is alert, follows commands appropriately, not in acute distress HEENT: No icterus, No thrush, No neck mass, Lee/AT Cardiovascular: IRRR, S1/S2, no rubs, no gallops Respiratory:bibasilar crackles.  No wheeze Abdomen: Soft/+BS, non tender, non distended, no guarding Extremities: trace LE edema, No lymphangitis, No petechiae, No rashes, no synovitis   Data Reviewed: I have personally reviewed following labs and imaging studies Basic Metabolic Panel: Recent Labs  Lab 10/23/20 2129 10/24/20 0502 10/26/20 0552 10/27/20 0603  NA 139 139 136 137  K 4.5 4.3 3.5 3.4*  CL 97* 96* 91* 89*  CO2 35* 36* 37* 38*  GLUCOSE 238* 103* 112* 110*  BUN 24* 25* 25* 23  CREATININE 1.02 0.93 0.79 0.80  CALCIUM 8.8* 8.6* 8.9 9.2   MG  --  2.2  --  2.3   Liver Function Tests: Recent Labs  Lab 10/23/20 2129 10/24/20 0502  AST 18 16  ALT 31 26  ALKPHOS 105 89  BILITOT 0.3 0.5  PROT 7.1 6.7  ALBUMIN 3.8 3.4*   No results for input(s): LIPASE, AMYLASE in the last 168 hours. No results for input(s): AMMONIA in the last 168 hours. Coagulation Profile: Recent Labs  Lab 10/23/20 2129 10/26/20 1839 10/27/20 0603  INR 1.1 1.0 1.1   CBC: Recent Labs  Lab 10/23/20 2129 10/26/20 0552 10/27/20 1201  WBC 17.4* 7.9 10.3  NEUTROABS 15.2*  --   --   HGB 11.9* 10.7* 11.7*  HCT 37.4* 33.8* 36.6*  MCV 98.2 96.8 96.8  PLT 301 179 212   Cardiac Enzymes: No results for input(s): CKTOTAL, CKMB, CKMBINDEX, TROPONINI in the last 168 hours. BNP: Invalid input(s): POCBNP  CBG: Recent Labs  Lab 10/26/20 0749 10/26/20 1116 10/26/20 1613 10/27/20 0719 10/27/20 1113  GLUCAP 120* 125* 155* 103* 147*   HbA1C: No results for input(s): HGBA1C in the last 72 hours. Urine analysis:    Component Value Date/Time   COLORURINE YELLOW 10/23/2020 2155   APPEARANCEUR CLEAR 10/23/2020 2155   LABSPEC 1.012 10/23/2020 2155   PHURINE 5.0 10/23/2020 2155   GLUCOSEU NEGATIVE 10/23/2020 2155   HGBUR NEGATIVE 10/23/2020 2155   BILIRUBINUR NEGATIVE 10/23/2020 2155   KETONESUR NEGATIVE 10/23/2020 2155   PROTEINUR NEGATIVE 10/23/2020 2155   UROBILINOGEN 1.0 10/07/2014 1638   NITRITE NEGATIVE 10/23/2020 2155   LEUKOCYTESUR TRACE (A) 10/23/2020 2155   Sepsis Labs: '@LABRCNTIP'$ (procalcitonin:4,lacticidven:4) ) Recent Results (from the past 240 hour(s))  Resp Panel by RT-PCR (Flu A&B, Covid) Nasopharyngeal Swab     Status: None   Collection Time: 10/23/20  9:20 PM   Specimen: Nasopharyngeal Swab; Nasopharyngeal(NP) swabs in vial transport medium  Result Value Ref Range Status   SARS Coronavirus 2 by RT PCR NEGATIVE NEGATIVE Final    Comment: (NOTE) SARS-CoV-2 target nucleic acids are NOT DETECTED.  The SARS-CoV-2 RNA is  generally detectable in upper respiratory specimens during the acute phase of infection. The lowest concentration of SARS-CoV-2 viral copies this assay can detect is 138 copies/mL. A negative result does not preclude SARS-Cov-2 infection and should not be used as the sole basis for treatment or other patient management decisions. A negative result may occur with  improper specimen collection/handling, submission of specimen other than nasopharyngeal swab, presence of viral mutation(s) within the areas targeted by this assay, and inadequate number of viral copies(<138 copies/mL). A negative result must be combined with clinical observations, patient history, and epidemiological information. The expected result is Negative.  Fact Sheet for Patients:  EntrepreneurPulse.com.au  Fact Sheet for Healthcare Providers:  IncredibleEmployment.be  This test is no t yet approved or cleared by the [place] FDA and  has been authorized for detection and/or diagnosis of SARS-CoV-2 by FDA under an Emergency Use Authorization (EUA). This EUA will remain  in effect (meaning this test can be used) for the duration of the COVID-19 declaration under Section 564(b)(1) of the Act, 21 U.S.C.section 360bbb-3(b)(1), unless the authorization is terminated  or revoked sooner.       Influenza A by PCR NEGATIVE NEGATIVE Final   Influenza B by PCR NEGATIVE NEGATIVE Final    Comment: (NOTE) The Xpert Xpress SARS-CoV-2/FLU/RSV plus assay is intended as an aid in the diagnosis of influenza from Nasopharyngeal swab specimens and should not be used as a sole basis for treatment. Nasal washings and aspirates are unacceptable for Xpert Xpress SARS-CoV-2/FLU/RSV testing.  Fact Sheet for Patients: EntrepreneurPulse.com.au  Fact Sheet for Healthcare Providers: IncredibleEmployment.be  This test is not yet approved or cleared by the [place] FDA and has been authorized for detection and/or diagnosis of SARS-CoV-2 by FDA under an Emergency Use Authorization (EUA). This EUA will remain in effect (meaning this test can be used) for the duration of the COVID-19 declaration under Section 564(b)(1) of the Act, 21 U.S.C. section 360bbb-3(b)(1), unless the authorization is terminated or revoked.  Performed at [facility], [address], [place], [place] [address]   Urine Culture     Status: None   Collection Time: 10/23/20  9:55 PM   Specimen: In/Out Cath Urine  Result Value Ref Range Status   Specimen Description   Final    IN/OUT CATH URINE Performed at [facility], [address], [place], [place] [address]    Special Requests   Final    NONE Performed at [facility], [address] [place], [place] [address]    Culture   Final    NO GROWTH Performed at [facility], [place] [address], [place], [place] [address]    Report Status 10/25/2020 FINAL  Final  Blood culture (routine single)     Status: None (Preliminary result)   Collection Time: 10/23/20 11:26 PM   Specimen: Right Antecubital; Blood  Result Value Ref Range Status   Specimen Description RIGHT ANTECUBITAL  Final   Special Requests   Final    BOTTLES DRAWN AEROBIC AND ANAEROBIC Blood Culture adequate volume   Culture   Final    NO GROWTH 4 DAYS Performed at [facility], [address], [place], [place] [address]    Report Status PENDING  Incomplete  Culture, blood (Routine X 2) w Reflex to ID Panel     Status: None (Preliminary result)   Collection Time: 10/24/20  1:21 AM   Specimen: Right Antecubital  Result Value Ref Range Status   Specimen Description RIGHT ANTECUBITAL  Final   Special Requests   Final    BOTTLES DRAWN AEROBIC AND ANAEROBIC Blood Culture adequate volume   Culture   Final    NO GROWTH 3 DAYS Performed at [facility], [address], [place], [place] [address]    Report Status PENDING  Incomplete  MRSA Next Gen by PCR,  Nasal     Status: None   Collection Time: 10/24/20  2:32 AM   Specimen: Nasal Mucosa; Nasal Swab  Result Value Ref Range Status   MRSA by PCR Next Gen NOT DETECTED NOT DETECTED Final    Comment: (NOTE) The GeneXpert MRSA Assay (FDA approved for NASAL specimens only), is one component of a comprehensive MRSA colonization surveillance program. It is not intended to diagnose MRSA infection nor to guide or monitor treatment for MRSA infections. Test performance is not FDA approved in patients less than 40 years old. Performed at [facility], [address] [place], [place] [address]   Resp Panel by RT-PCR (Flu A&B, Covid) Nasopharyngeal Swab     Status: None   Collection Time: 10/26/20 11:25 AM   Specimen: Nasopharyngeal Swab; Nasopharyngeal(NP) swabs in vial transport medium  Result Value Ref Range Status   SARS Coronavirus 2 by RT PCR NEGATIVE NEGATIVE Final    Comment: (NOTE) SARS-CoV-2 target nucleic acids are NOT DETECTED.  The SARS-CoV-2 RNA is generally detectable in upper respiratory specimens during the acute phase of infection. The lowest concentration of SARS-CoV-2 viral copies this assay can detect is 138 copies/mL. A negative result does not preclude SARS-Cov-2 infection and should not be used as the sole basis for treatment or other patient management decisions. A negative result may occur with  improper specimen collection/handling, submission of specimen other than nasopharyngeal swab, presence of viral mutation(s) within the areas targeted by this assay, and inadequate number of viral copies(<138 copies/mL). A negative result must be combined with clinical observations, patient history, and epidemiological information. The expected result is Negative.  Fact Sheet for Patients:  EntrepreneurPulse.com.au  Fact Sheet for Healthcare Providers:  IncredibleEmployment.be  This test is no t yet approved or cleared by the [place]  FDA and  has been authorized for detection and/or diagnosis of SARS-CoV-2 by FDA under an Emergency Use Authorization (EUA). This EUA will remain  in effect (meaning this test can be  used) for the duration of the COVID-19 declaration under Section 564(b)(1) of the Act, 21 U.S.C.section 360bbb-3(b)(1), unless the authorization is terminated  or revoked sooner.       Influenza A by PCR NEGATIVE NEGATIVE Final   Influenza B by PCR NEGATIVE NEGATIVE Final    Comment: (NOTE) The Xpert Xpress SARS-CoV-2/FLU/RSV plus assay is intended as an aid in the diagnosis of influenza from Nasopharyngeal swab specimens and should not be used as a sole basis for treatment. Nasal washings and aspirates are unacceptable for Xpert Xpress SARS-CoV-2/FLU/RSV testing.  Fact Sheet for Patients: EntrepreneurPulse.com.au  Fact Sheet for Healthcare Providers: IncredibleEmployment.be  This test is not yet approved or cleared by the [place] FDA and has been authorized for detection and/or diagnosis of SARS-CoV-2 by FDA under an Emergency Use Authorization (EUA). This EUA will remain in effect (meaning this test can be used) for the duration of the COVID-19 declaration under Section 564(b)(1) of the Act, 21 U.S.C. section 360bbb-3(b)(1), unless the authorization is terminated or revoked.  Performed at [facility], [address] [place], [place] [address]      Scheduled Meds:  chlorhexidine  15 mL Mouth Rinse BID   Chlorhexidine Gluconate Cloth  6 each Topical Q0600   furosemide  40 mg Intravenous BID   insulin aspart  0-5 Units Subcutaneous QHS   insulin aspart  0-9 Units Subcutaneous TID WC   mouth rinse  15 mL Mouth Rinse q12n4p   metoprolol succinate  12.5 mg Oral Daily   spironolactone  12.5 mg Oral Daily   warfarin  10 mg Oral ONCE-1600   Warfarin - Pharmacist Dosing Inpatient   Does not apply q1600   Continuous Infusions:  Procedures/Studies: DG  Ribs Unilateral W/Chest Right  Result Date: 10/05/2020 CLINICAL DATA:  Right chest pain after a fall today. Initial encounter. EXAM: RIGHT RIBS AND CHEST - 3+ VIEW COMPARISON:  CT chest and single view of the chest 09/15/2019. FINDINGS: The patient has acute fractures of right sixth and seventh ribs. The right sixth rib fracture is nondisplaced. There is mild superior displacement of the distal fragment of the seventh rib. Small left pleural effusion and chronic basilar atelectasis appear unchanged. Heart size is upper normal. The patient is status post CABG. No pneumothorax is identified. IMPRESSION: Acute right sixth and seventh rib fractures. Negative for pneumothorax. No change in a small left pleural effusion and basilar atelectasis. Electronically Signed   By: Inge Rise M.D.   On: 10/05/2020 14:14   DG Shoulder Right  Result Date: 10/05/2020 CLINICAL DATA:  Status post fall.  Right shoulder pain. EXAM: RIGHT SHOULDER - 2+ VIEW COMPARISON:  None. FINDINGS: There is no evidence of fracture or dislocation. Mild degenerative changes noted at the acromioclavicular joint. There is an acute fracture involving the posterolateral aspect of the approximate right seventh rib. Soft tissues are unremarkable. IMPRESSION: Acute fracture involves the posterolateral aspect of the approximate right seventh rib. No signs of shoulder fracture. Electronically Signed   By: Kerby Moors M.D.   On: 10/05/2020 13:44   DG Chest Port 1 View  Result Date: 10/25/2020 CLINICAL DATA:  Shortness of breath. EXAM: PORTABLE CHEST 1 VIEW COMPARISON:  10/23/2020.  07/24/2019. FINDINGS: Prior CABG. Stable cardiomegaly. Interim near complete resolution of pulmonary interstitial prominence suggesting resolving interstitial edema. Underlying chronic interstitial disease and pleuroparenchymal thickening again noted. Small bilateral pleural effusions cannot be excluded. No pneumothorax. IMPRESSION: 1. Prior CABG. Stable cardiomegaly.  Interim near complete resolution of pulmonary interstitial  prominence suggesting resolving interstitial edema. 2. Chronic interstitial disease and pleuroparenchymal thickening again noted. Small bilateral pleural effusions cannot be excluded. Electronically Signed   By: Marcello Moores  Register   On: 10/25/2020 08:17   DG Chest Port 1 View  Result Date: 10/23/2020 CLINICAL DATA:  Respiratory distress. History of prior CABG surgery. EXAM: PORTABLE CHEST 1 VIEW COMPARISON:  10/07/2020 and older exams.  CT, 09/15/2019. FINDINGS: Stable changes from prior CABG surgery. Cardiac silhouette is top-normal in size. No mediastinal or hilar masses. Bilateral vascular congestion and interstitial thickening similar to the most recent prior exam. There is opacity at the left lung base consistent with a pleural effusion, which may be increased compared to the most recent prior study. Possible small right effusion. There is opacity in the peripheral left mid to upper lung associated with pleural thickening consistent with chronic scarring, stable. Surgical vascular clips overlie the left hilum, also stable. No pneumothorax. Skeletal structures are grossly intact. IMPRESSION: 1. Findings support mild congestive heart failure superimposed on chronic findings. Vascular congestion appears increased from the prior study. Left pleural effusion may be larger. Probable small right effusion. 2. Other findings detailed above are chronic and without change from the prior exam. Electronically Signed   By: Lajean Manes M.D.   On: 10/23/2020 21:55   DG CHEST PORT 1 VIEW  Result Date: 10/07/2020 CLINICAL DATA:  86 year old male with history of dyspnea and multiple right-sided rib fractures. EXAM: PORTABLE CHEST 1 VIEW COMPARISON:  Chest x-ray 10/06/2020. FINDINGS: Low lung volumes. Chronic areas of pleuroparenchymal thickening and architectural distortion and chronic left pleural effusion, similar to prior examinations. Opacity at the left base  and in the left mid lung periphery corresponds to areas of chronic rounded atelectasis on prior chest CT. Right lung is clear. No right pleural effusion. No pneumothorax. No evidence of pulmonary edema. Heart size is normal. Upper mediastinal contours are within normal limits. Aortic atherosclerosis. Status post median sternotomy. Minimally displaced fractures of the lateral aspects of the right sixth, seventh and eighth ribs again noted. IMPRESSION: 1. Acute minimally displaced fractures of the right sixth, seventh and eighth ribs. No right-sided pneumothorax. 2. Low lung volumes with chronic areas of rounded atelectasis in the left lung and chronic small left pleural effusion, similar to prior examinations. 3. Aortic atherosclerosis. Electronically Signed   By: Vinnie Langton M.D.   On: 10/07/2020 05:31   DG CHEST PORT 1 VIEW  Result Date: 10/06/2020 CLINICAL DATA:  Shortness of breath.  Known rib fractures. EXAM: PORTABLE CHEST 1 VIEW COMPARISON:  Chest x-ray 10/05/2020 FINDINGS: Chronic left pleural effusion and left lower lobe atelectasis with the appearance of rounded atelectasis on the prior chest CT. Chronic scarring changes involving the right lung. No definite acute pulmonary findings. Stable right-sided rib fractures but no pneumothorax or pleural effusion. IMPRESSION: 1. Stable chronic left pleural effusion and left lower lobe atelectasis. 2. Stable right-sided rib fractures. Electronically Signed   By: Marijo Sanes M.D.   On: 10/06/2020 15:37   ECHOCARDIOGRAM COMPLETE  Result Date: 10/07/2020    ECHOCARDIOGRAM REPORT   Patient Name:   BERTRAN GIRARD Date of Exam: 10/07/2020 Medical Rec #:  [id number]       Height:       68.0 in Accession #:    [id number]      Weight:       233.0 lb Date of Birth:  05-06-1934       BSA:  2.181 m Patient Age:    82 years        BP:           95/56 mmHg Patient Gender: M               HR:           85 bpm. Exam Location:  [facility] Procedure: 2D Echo,  Cardiac Doppler and Color Doppler Indications:    Dyspnea  History:        Patient has prior history of Echocardiogram examinations, most                 recent 07/01/2019. CHF, CAD, Prior CABG, Arrythmias:Atrial                 Fibrillation, Signs/Symptoms:Shortness of Breath; Risk                 Factors:Hypertension and Former Smoker.  Sonographer:    Wenda Low Referring Phys: [place] Comments: Patient refused the use of Definity. IMPRESSIONS  1. LVEF is severely depressed with severe hypokineis of the mid/distal ventricle; distal anterior, dsital inferior and apical akinesis. Note apex is not well seen in this bedside study COmpared to 2021 no significant change . Left ventricular ejection fraction, by estimation, is 20%%. The left ventricular internal cavity size was severely dilated. Left ventricular diastolic parameters are indeterminate.  2. Right ventricular systolic function is normal. The right ventricular size is normal. There is normal pulmonary artery systolic pressure.  3. Trivial mitral valve regurgitation.  4. The aortic valve is tricuspid. Aortic valve regurgitation is not visualized. Mild aortic valve sclerosis is present, with no evidence of aortic valve stenosis. FINDINGS  Left Ventricle: LVEF is severely depressed with severe hypokineis of the mid/distal ventricle; distal anterior, dsital inferior and apical akinesis. Note apex is not well seen in this bedside study COmpared to 2021 no significant change. Left ventricular ejection fraction, by estimation, is 20%%. The left ventricular internal cavity size was severely dilated. There is no left ventricular hypertrophy. Left ventricular diastolic parameters are indeterminate. Right Ventricle: The right ventricular size is normal. Right vetricular wall thickness was not assessed. Right ventricular systolic function is normal. There is normal pulmonary artery systolic pressure. The tricuspid regurgitant velocity  is 1.72 m/s, and with an assumed right atrial pressure of 3 mmHg, the estimated right ventricular systolic pressure is [id number] mmHg. Left Atrium: Left atrial size was normal in size. Right Atrium: Right atrial size was normal in size. Pericardium: Trivial pericardial effusion is present. Mitral Valve: There is mild thickening of the mitral valve leaflet(s). Trivial mitral valve regurgitation. MV peak gradient, 5.6 mmHg. The mean mitral valve gradient is 2.0 mmHg. Tricuspid Valve: The tricuspid valve is normal in structure. Tricuspid valve regurgitation is trivial. Aortic Valve: The aortic valve is tricuspid. Aortic valve regurgitation is not visualized. Mild aortic valve sclerosis is present, with no evidence of aortic valve stenosis. Aortic valve mean gradient measures 3.0 mmHg. Aortic valve peak gradient measures 7.1 mmHg. Aortic valve area, by VTI measures 3.10 cm. Pulmonic Valve: The pulmonic valve was normal in structure. Pulmonic valve regurgitation is not visualized. Aorta: The aortic root is normal in size and structure. IAS/Shunts: No atrial level shunt detected by color flow Doppler.  LEFT VENTRICLE PLAX 2D LVIDd:         6.31 cm  Diastology LVIDs:         5.80 cm  LV e' medial:  12.60 cm/s LV PW:         1.04 cm  LV E/e' medial:  7.5 LV IVS:        1.13 cm  LV e' lateral:   13.20 cm/s LVOT diam:     2.10 cm  LV E/e' lateral: 7.2 LV SV:         69 LV SV Index:   32 LVOT Area:     3.46 cm  RIGHT VENTRICLE RV Basal diam:  3.62 cm TAPSE (M-mode): 2.4 cm LEFT ATRIUM             Index       RIGHT ATRIUM           Index LA diam:        4.50 cm 2.06 cm/m  RA Area:     15.40 cm LA Vol (A2C):   66.7 ml 30.58 ml/m RA Volume:   34.80 ml  15.96 ml/m LA Vol (A4C):   65.5 ml 30.03 ml/m LA Biplane Vol: 67.7 ml 31.04 ml/m  AORTIC VALVE AV Area (Vmax):    2.73 cm AV Area (Vmean):   2.79 cm AV Area (VTI):     3.10 cm AV Vmax:           133.00 cm/s AV Vmean:          78.800 cm/s AV VTI:            0.222 m AV Peak  Grad:      7.1 mmHg AV Mean Grad:      3.0 mmHg LVOT Vmax:         105.00 cm/s LVOT Vmean:        63.500 cm/s LVOT VTI:          0.199 m LVOT/AV VTI ratio: 0.90  AORTA Ao Root diam: 3.40 cm Ao Asc diam:  3.10 cm MITRAL VALVE               TRICUSPID VALVE MV Area (PHT): 5.97 cm    TR Peak grad:   11.8 mmHg MV Area VTI:   3.57 cm    TR Vmax:        172.00 cm/s MV Peak grad:  5.6 mmHg MV Mean grad:  2.0 mmHg    SHUNTS MV Vmax:       1.18 m/s    Systemic VTI:  0.20 m MV Vmean:      60.6 cm/s   Systemic Diam: 2.10 cm MV Decel Time: 127 msec MV E velocity: 94.60 cm/s Dorris Carnes MD Electronically signed by Dorris Carnes MD Signature Date/Time: 10/07/2020/4:22:38 PM    Final     Orson Eva, DO  [facility]  If 7PM-7AM, please contact night-coverage [email or web address] Password [email or web address] 10/27/2020, 12:12 PM   LOS: 4 days

## 2020-10-27 NOTE — Progress Notes (Signed)
Physical Therapy Treatment Patient Details Name: Benjamin Cordova MRN: [id number] DOB: [birth year] Today's Date: 10/27/2020    History of Present Illness Benjamin Cordova  is a 86 y.o. male, with history of coronary artery disease status post CABG, hypertension, stroke, ischemic cardiomyopathy, peripheral vascular disease, and more presents the ED with a chief complaint of hypoxia.  Son at bedside provides the history as patient is altered and BiPAP.  Son reports that patient had been here after a fall that was mechanical.  He used to ambulate with a walker, toppled over to the side with a walker, and had broken ribs.  He was admitted at that time, discharged to the [facility].  Patient then developed pneumonia, and was admitted to [facility].  Today he was going to go to [facility] but they were on diversion so they came to the ED.  In ER patient respiratory distress.  Patient been complaining of shortness of breath since he was diagnosed with pneumonia, and it seems to be constant.  Then today he had sudden onset of worsening shortness of breath, and hypoxia.  Son does report that granddaughter of patient had been to visit him and provided him with a salty burger meal.  He was given a breathing treatment at [facility], and his O2 sats were just were not coming up, so they brought him to the ED.  According to signout, EMS arrived to find patient's oxygen saturation was in the 30s.  To  's knowledge patient has not complained of chest pain.  He is only recently been off antibiotics for pneumonia and UTI.  Apparently since has been back to the [facility] he was only there for 2 days.  Son is unsure what antibiotics were prescribed last time.    PT Comments    Patient presents seated at bedside (assisted by nursing staff) and agreeable for therapy.  Patient unable to complete ankle exercises with RLE due to hx of polio, "per patient", but good return for completing LAQ's and hip raises, very  unsteady on feet and limited to a few slow labored side steps before having to sit due to weakness and c/o fatigue.  Patient on room air during visit with SpO2 dropping from 91% to 87% with exertion and put back on 2 LPM after therapy - RN notified.  Patient will benefit from continued physical therapy in hospital and recommended venue below to increase strength, balance, endurance for safe ADLs and gait.    Follow Up Recommendations  SNF     Equipment Recommendations  None recommended by PT    Recommendations for Other Services       Precautions / Restrictions Precautions Precautions: Fall Restrictions Weight Bearing Restrictions: No    Mobility  Bed Mobility               General bed mobility comments: Presents seated at bedside (assisted by nursing staff, required Mod assist to sit per RN)    Transfers Overall transfer level: Needs assistance Equipment used: Rolling walker (2 wheeled) Transfers: Sit to/from [facility] Sit to Stand: Mod assist Stand pivot transfers: Mod assist       General transfer comment: slow labored movement  Ambulation/Gait Ambulation/Gait assistance: Mod assist Gait Distance (Feet): 5 Feet Assistive device: Rolling walker (2 wheeled) Gait Pattern/deviations: Step-through pattern;Decreased step length - right;Decreased step length - left;Decreased stride length;Trunk flexed;Wide base of support Gait velocity: decreased   General Gait Details: limited to 5-6 slow labored unsteady steps  at bedside before having to sit due to c/o fatigue   Stairs             Wheelchair Mobility    Modified Rankin (Stroke Patients Only)       Balance Overall balance assessment: Needs assistance Sitting-balance support: Feet supported;No upper extremity supported Sitting balance-Leahy Scale: Fair Sitting balance - Comments: fair/good seated at bedside, occasionally had to lean on head post once fatigued   Standing balance  support: During functional activity;Bilateral upper extremity supported Standing balance-Leahy Scale: Poor Standing balance comment: fair/poor using RW                            Cognition Arousal/Alertness: Awake/alert Behavior During Therapy: WFL for tasks assessed/performed Overall Cognitive Status: Within Functional Limits for tasks assessed                                        Exercises General Exercises - Lower Extremity Long Arc Quad: Seated;AROM;Strengthening;Both;10 reps Hip Flexion/Marching: Seated;AROM;Strengthening;Both;10 reps Toe Raises: Seated;AROM;Strengthening;Left;10 reps Heel Raises: Seated;AROM;Strengthening;Left;10 reps    General Comments        Pertinent Vitals/Pain Pain Assessment: No/denies pain    Home Living                      Prior Function            PT Goals (current goals can now be found in the care plan section) Acute Rehab PT Goals Patient Stated Goal: Go back to SNF to get stronger before returning home. PT Goal Formulation: With patient Time For Goal Achievement: 11/07/20 Potential to Achieve Goals: Good Progress towards PT goals: Progressing toward goals    Frequency    Min 3X/week      PT Plan Current plan remains appropriate    Co-evaluation              AM-PAC PT "6 Clicks" Mobility   Outcome Measure  Help needed turning from your back to your side while in a flat bed without using bedrails?: A Little Help needed moving from lying on your back to sitting on the side of a flat bed without using bedrails?: A Little Help needed moving to and from a bed to a chair (including a wheelchair)?: A Lot Help needed standing up from a chair using your arms (e.g., wheelchair or bedside chair)?: A Lot Help needed to walk in hospital room?: A Lot Help needed climbing 3-5 steps with a railing? : Total 6 Click Score: 13    End of Session Equipment Utilized During Treatment:  Oxygen Activity Tolerance: Patient tolerated treatment well;Patient limited by fatigue Patient left: in chair;with call bell/phone within reach Nurse Communication: Mobility status PT Visit Diagnosis: Unsteadiness on feet (R26.81);History of falling (Z91.81);Difficulty in walking, not elsewhere classified (R26.2);Muscle weakness (generalized) (M62.81)     Time: [id number] PT Time Calculation (min) (ACUTE ONLY): 20 min  Charges:  $Therapeutic Exercise: 8-22 mins $Therapeutic Activity: 8-22 mins                     12:35 PM, 10/27/20 Lonell Grandchild, MPT Physical Therapist with [facility] [phone] office [phone] mobile phone

## 2020-10-27 NOTE — Progress Notes (Signed)
ANTICOAGULATION CONSULT NOTE -   Pharmacy Consult for warfarin Indication: atrial fibrillation  No Known Allergies  Patient Measurements: Height: '5\' 8"'$  (172.7 cm) Weight: 104.3 kg (229 lb 15 oz) IBW/kg (Calculated) : 68.4 Heparin Dosing Weight: 91.3 kg  Vital Signs: Temp: 98.2 F (36.8 C) (07/28 0444) Temp Source: Oral (07/28 0444) BP: 118/62 (07/28 0444) Pulse Rate: 104 (07/28 0444)  Labs: Recent Labs    10/26/20 0552 10/26/20 1839 10/27/20 0603  HGB 10.7*  --   --   HCT 33.8*  --   --   PLT 179  --   --   LABPROT  --  13.1 14.1  INR  --  1.0 1.1  CREATININE 0.79  --  0.80     Estimated Creatinine Clearance: 81.9 mL/min (by C-G formula based on SCr of 0.8 mg/dL).   Medical History: Past Medical History:  Diagnosis Date   Arthritis    Coronary artery disease    a. s/p CABG in 1981 b. cath in 10/2014 showing occluded grafts --> redo CABG in 2016 LIMA-LAD and Left Radial-OM2   Essential hypertension    Hematuria    History of blood transfusion 07/2015   Knee replacement HgB <9   History of pneumonia    History of stroke    Ischemic cardiomyopathy    Left ventricular apical thrombus    Mixed hyperlipidemia    Myocardial infarction [facility]) 1980   Peripheral vascular disease ([place])    Polio osteopathy of lower leg (HCC) Age 6   Affected right leg   Skin cancer    TIA (transient ischemic attack) 1989   Chronic coumadin   UTI (lower urinary tract infection)     Medications:  Medications Prior to Admission  Medication Sig Dispense Refill Last Dose   acetaminophen (TYLENOL) 325 MG tablet Take 2 tablets (650 mg total) by mouth every 6 (six) hours.   PRN   albuterol (PROVENTIL) (2.5 MG/3ML) 0.083% nebulizer solution Take 2.5 mg by nebulization every 6 (six) hours as needed for wheezing or shortness of breath.    PRN   ascorbic acid (VITAMIN C) 500 MG tablet Take 1,000 mg by mouth 2 (two) times daily.   10/23/2020   diphenhydrAMINE (BENADRYL) 50 MG capsule Take 1  capsule (50 mg total) by mouth at bedtime as needed for sleep. 30 capsule 0 PRN   finasteride (PROSCAR) 5 MG tablet Take 5 mg by mouth daily.   [id number]   folic acid (FOLVITE) Q000111Q MCG tablet Take 800 mcg by mouth daily.   10/23/2020   furosemide (LASIX) 40 MG tablet Take 1 tablet (40 mg total) by mouth daily. 30 tablet  10/23/2020   ipratropium-albuterol (DUONEB) 0.5-2.5 (3) MG/3ML SOLN Take 3 mLs by nebulization every 4 (four) hours as needed (SOB/wheezing).   10/23/2020   metoprolol succinate (TOPROL-XL) 25 MG 24 hr tablet Take 0.5 tablets (12.5 mg total) by mouth daily. (Patient taking differently: Take 25 mg by mouth daily.)   10/23/2020 at 0600   Multiple Vitamins-Minerals (CERTAVITE/ANTIOXIDANTS) TABS Take 1 tablet by mouth daily.    10/23/2020   oxyCODONE (OXY IR/ROXICODONE) 5 MG immediate release tablet Take 1 tablet (5 mg total) by mouth every 6 (six) hours as needed for severe pain. 20 tablet 0 10/13/2020   potassium chloride (K-DUR) 10 MEQ tablet Take 10 mEq by mouth daily. Take with Lasix (Furosemide)   10/23/2020   senna-docusate (SENOKOT-S) 8.6-50 MG tablet Take 1 tablet by mouth at bedtime.   10/22/2020  simvastatin (ZOCOR) 40 MG tablet Take 40 mg by mouth at bedtime.    10/22/2020   spironolactone (ALDACTONE) 25 MG tablet Take 0.5 tablets (12.5 mg total) by mouth daily. 45 tablet 3 10/23/2020   tamsulosin (FLOMAX) 0.4 MG CAPS capsule Take 0.4 mg by mouth in the morning.    10/23/2020   warfarin (COUMADIN) 5 MG tablet Take 1.5 tablets (7.5 mg total) by mouth daily at 6 PM. Take 10 mg every night except Friday take 5 mg (Patient taking differently: Take 10 mg by mouth daily at 6 PM.)   10/23/2020 at 1600    Assessment: Benjamin Cordova is an 86 yoM with history of Afib on chronic anticoagulation with warfarin. Pharmacy consulted to restart warfarin. Patients last dose per PTA med list was 7/24. Unsure of compliance as INR here is subtherapeutic at 1.   PTA warfarin dose documented as 10 mg daily  although was therapeutic recent admission on 7.5 mg daily.  Goal of Therapy:  INR 2-3 Monitor platelets by anticoagulation protocol: Yes    Plan:  Give warfarin 10 mg po x 1 Monitor daily INR and s/s of bleeding.   Thank you for allowing [place] to participate in this patients care.  Margot Ables, PharmD Clinical Pharmacist 10/27/2020 11:12 AM

## 2020-10-28 DIAGNOSIS — J9621 Acute and chronic respiratory failure with hypoxia: Secondary | ICD-10-CM | POA: Diagnosis not present

## 2020-10-28 DIAGNOSIS — E872 Acidosis: Secondary | ICD-10-CM | POA: Diagnosis present

## 2020-10-28 DIAGNOSIS — R54 Age-related physical debility: Secondary | ICD-10-CM | POA: Diagnosis present

## 2020-10-28 DIAGNOSIS — I5043 Acute on chronic combined systolic (congestive) and diastolic (congestive) heart failure: Secondary | ICD-10-CM | POA: Diagnosis not present

## 2020-10-28 DIAGNOSIS — S2241XD Multiple fractures of ribs, right side, subsequent encounter for fracture with routine healing: Secondary | ICD-10-CM | POA: Diagnosis not present

## 2020-10-28 DIAGNOSIS — R000 Tachycardia, unspecified: Secondary | ICD-10-CM | POA: Diagnosis not present

## 2020-10-28 DIAGNOSIS — R0603 Acute respiratory distress: Secondary | ICD-10-CM | POA: Diagnosis present

## 2020-10-28 DIAGNOSIS — Z66 Do not resuscitate: Secondary | ICD-10-CM | POA: Diagnosis not present

## 2020-10-28 DIAGNOSIS — W19XXXD Unspecified fall, subsequent encounter: Secondary | ICD-10-CM | POA: Diagnosis present

## 2020-10-28 DIAGNOSIS — I480 Paroxysmal atrial fibrillation: Secondary | ICD-10-CM | POA: Diagnosis not present

## 2020-10-28 DIAGNOSIS — I5023 Acute on chronic systolic (congestive) heart failure: Secondary | ICD-10-CM | POA: Diagnosis not present

## 2020-10-28 DIAGNOSIS — R52 Pain, unspecified: Secondary | ICD-10-CM | POA: Diagnosis not present

## 2020-10-28 DIAGNOSIS — R531 Weakness: Secondary | ICD-10-CM | POA: Diagnosis not present

## 2020-10-28 DIAGNOSIS — R6520 Severe sepsis without septic shock: Secondary | ICD-10-CM | POA: Diagnosis not present

## 2020-10-28 DIAGNOSIS — I472 Ventricular tachycardia: Secondary | ICD-10-CM | POA: Diagnosis not present

## 2020-10-28 DIAGNOSIS — R2681 Unsteadiness on feet: Secondary | ICD-10-CM | POA: Diagnosis not present

## 2020-10-28 DIAGNOSIS — L98411 Non-pressure chronic ulcer of buttock limited to breakdown of skin: Secondary | ICD-10-CM | POA: Diagnosis not present

## 2020-10-28 DIAGNOSIS — I255 Ischemic cardiomyopathy: Secondary | ICD-10-CM | POA: Diagnosis present

## 2020-10-28 DIAGNOSIS — R0689 Other abnormalities of breathing: Secondary | ICD-10-CM | POA: Diagnosis not present

## 2020-10-28 DIAGNOSIS — J9601 Acute respiratory failure with hypoxia: Secondary | ICD-10-CM | POA: Diagnosis not present

## 2020-10-28 DIAGNOSIS — Z86718 Personal history of other venous thrombosis and embolism: Secondary | ICD-10-CM | POA: Diagnosis not present

## 2020-10-28 DIAGNOSIS — J9610 Chronic respiratory failure, unspecified whether with hypoxia or hypercapnia: Secondary | ICD-10-CM | POA: Diagnosis not present

## 2020-10-28 DIAGNOSIS — Z9981 Dependence on supplemental oxygen: Secondary | ICD-10-CM | POA: Diagnosis not present

## 2020-10-28 DIAGNOSIS — I10 Essential (primary) hypertension: Secondary | ICD-10-CM | POA: Diagnosis not present

## 2020-10-28 DIAGNOSIS — J9811 Atelectasis: Secondary | ICD-10-CM | POA: Diagnosis not present

## 2020-10-28 DIAGNOSIS — R404 Transient alteration of awareness: Secondary | ICD-10-CM | POA: Diagnosis not present

## 2020-10-28 DIAGNOSIS — J90 Pleural effusion, not elsewhere classified: Secondary | ICD-10-CM | POA: Diagnosis not present

## 2020-10-28 DIAGNOSIS — N179 Acute kidney failure, unspecified: Secondary | ICD-10-CM | POA: Diagnosis not present

## 2020-10-28 DIAGNOSIS — I5032 Chronic diastolic (congestive) heart failure: Secondary | ICD-10-CM | POA: Diagnosis not present

## 2020-10-28 DIAGNOSIS — I639 Cerebral infarction, unspecified: Secondary | ICD-10-CM | POA: Diagnosis not present

## 2020-10-28 DIAGNOSIS — J9622 Acute and chronic respiratory failure with hypercapnia: Secondary | ICD-10-CM | POA: Diagnosis not present

## 2020-10-28 DIAGNOSIS — R0602 Shortness of breath: Secondary | ICD-10-CM | POA: Diagnosis not present

## 2020-10-28 DIAGNOSIS — R4182 Altered mental status, unspecified: Secondary | ICD-10-CM | POA: Diagnosis not present

## 2020-10-28 DIAGNOSIS — Y95 Nosocomial condition: Secondary | ICD-10-CM | POA: Diagnosis present

## 2020-10-28 DIAGNOSIS — N400 Enlarged prostate without lower urinary tract symptoms: Secondary | ICD-10-CM | POA: Diagnosis present

## 2020-10-28 DIAGNOSIS — R279 Unspecified lack of coordination: Secondary | ICD-10-CM | POA: Diagnosis not present

## 2020-10-28 DIAGNOSIS — Z7401 Bed confinement status: Secondary | ICD-10-CM | POA: Diagnosis not present

## 2020-10-28 DIAGNOSIS — Z9111 Patient's noncompliance with dietary regimen: Secondary | ICD-10-CM | POA: Diagnosis not present

## 2020-10-28 DIAGNOSIS — R5381 Other malaise: Secondary | ICD-10-CM | POA: Diagnosis not present

## 2020-10-28 DIAGNOSIS — Z951 Presence of aortocoronary bypass graft: Secondary | ICD-10-CM | POA: Diagnosis not present

## 2020-10-28 DIAGNOSIS — A419 Sepsis, unspecified organism: Secondary | ICD-10-CM | POA: Diagnosis not present

## 2020-10-28 DIAGNOSIS — R0600 Dyspnea, unspecified: Secondary | ICD-10-CM | POA: Diagnosis not present

## 2020-10-28 DIAGNOSIS — M6281 Muscle weakness (generalized): Secondary | ICD-10-CM | POA: Diagnosis not present

## 2020-10-28 DIAGNOSIS — I2510 Atherosclerotic heart disease of native coronary artery without angina pectoris: Secondary | ICD-10-CM | POA: Diagnosis present

## 2020-10-28 DIAGNOSIS — E669 Obesity, unspecified: Secondary | ICD-10-CM | POA: Diagnosis not present

## 2020-10-28 DIAGNOSIS — I214 Non-ST elevation (NSTEMI) myocardial infarction: Secondary | ICD-10-CM | POA: Diagnosis not present

## 2020-10-28 DIAGNOSIS — L24A2 Irritant contact dermatitis due to fecal, urinary or dual incontinence: Secondary | ICD-10-CM | POA: Diagnosis not present

## 2020-10-28 DIAGNOSIS — E782 Mixed hyperlipidemia: Secondary | ICD-10-CM | POA: Diagnosis present

## 2020-10-28 DIAGNOSIS — E875 Hyperkalemia: Secondary | ICD-10-CM | POA: Diagnosis not present

## 2020-10-28 DIAGNOSIS — I517 Cardiomegaly: Secondary | ICD-10-CM | POA: Diagnosis not present

## 2020-10-28 DIAGNOSIS — Z20822 Contact with and (suspected) exposure to covid-19: Secondary | ICD-10-CM | POA: Diagnosis not present

## 2020-10-28 DIAGNOSIS — R2243 Localized swelling, mass and lump, lower limb, bilateral: Secondary | ICD-10-CM | POA: Diagnosis not present

## 2020-10-28 DIAGNOSIS — Z5181 Encounter for therapeutic drug level monitoring: Secondary | ICD-10-CM | POA: Diagnosis not present

## 2020-10-28 DIAGNOSIS — R0902 Hypoxemia: Secondary | ICD-10-CM | POA: Diagnosis not present

## 2020-10-28 DIAGNOSIS — I110 Hypertensive heart disease with heart failure: Secondary | ICD-10-CM | POA: Diagnosis not present

## 2020-10-28 DIAGNOSIS — L509 Urticaria, unspecified: Secondary | ICD-10-CM | POA: Diagnosis not present

## 2020-10-28 DIAGNOSIS — I739 Peripheral vascular disease, unspecified: Secondary | ICD-10-CM | POA: Diagnosis not present

## 2020-10-28 DIAGNOSIS — M1990 Unspecified osteoarthritis, unspecified site: Secondary | ICD-10-CM | POA: Diagnosis present

## 2020-10-28 DIAGNOSIS — J189 Pneumonia, unspecified organism: Secondary | ICD-10-CM | POA: Diagnosis not present

## 2020-10-28 DIAGNOSIS — R778 Other specified abnormalities of plasma proteins: Secondary | ICD-10-CM | POA: Diagnosis not present

## 2020-10-28 DIAGNOSIS — D649 Anemia, unspecified: Secondary | ICD-10-CM | POA: Diagnosis present

## 2020-10-28 DIAGNOSIS — I513 Intracardiac thrombosis, not elsewhere classified: Secondary | ICD-10-CM | POA: Diagnosis not present

## 2020-10-28 DIAGNOSIS — Z7901 Long term (current) use of anticoagulants: Secondary | ICD-10-CM | POA: Diagnosis not present

## 2020-10-28 DIAGNOSIS — G9341 Metabolic encephalopathy: Secondary | ICD-10-CM | POA: Diagnosis not present

## 2020-10-28 DIAGNOSIS — J9602 Acute respiratory failure with hypercapnia: Secondary | ICD-10-CM | POA: Diagnosis not present

## 2020-10-28 DIAGNOSIS — I959 Hypotension, unspecified: Secondary | ICD-10-CM | POA: Diagnosis not present

## 2020-10-28 LAB — CBC
HCT: 39.5 % (ref 39.0–52.0)
Hemoglobin: 12.2 g/dL — ABNORMAL LOW (ref 13.0–17.0)
MCH: 30.2 pg (ref 26.0–34.0)
MCHC: 30.9 g/dL (ref 30.0–36.0)
MCV: 97.8 fL (ref 80.0–100.0)
Platelets: 201 10*3/uL (ref 150–400)
RBC: 4.04 MIL/uL — ABNORMAL LOW (ref 4.22–5.81)
RDW: 12.6 % (ref 11.5–15.5)
WBC: 8.9 10*3/uL (ref 4.0–10.5)
nRBC: 0 % (ref 0.0–0.2)

## 2020-10-28 LAB — GLUCOSE, CAPILLARY
Glucose-Capillary: 114 mg/dL — ABNORMAL HIGH (ref 70–99)
Glucose-Capillary: 142 mg/dL — ABNORMAL HIGH (ref 70–99)
Glucose-Capillary: 105 mg/dL — ABNORMAL HIGH (ref 70–99)

## 2020-10-28 LAB — BASIC METABOLIC PANEL
Anion gap: 10 (ref 5–15)
BUN: 31 mg/dL — ABNORMAL HIGH (ref 8–23)
CO2: 38 mmol/L — ABNORMAL HIGH (ref 22–32)
Calcium: 9.3 mg/dL (ref 8.9–10.3)
Chloride: 91 mmol/L — ABNORMAL LOW (ref 98–111)
Creatinine, Ser: 0.83 mg/dL (ref 0.61–1.24)
GFR, Estimated: >60 mL/min (ref >60)
Glucose, Bld: 108 mg/dL — ABNORMAL HIGH (ref 70–99)
Potassium: 3.8 mmol/L (ref 3.5–5.1)
Sodium: 139 mmol/L (ref 135–145)

## 2020-10-28 LAB — CULTURE, BLOOD (SINGLE)
Culture: NO GROWTH
Special Requests: ADEQUATE

## 2020-10-28 LAB — PROTIME-INR
INR: 1.2 (ref 0.8–1.2)
Prothrombin Time: 15.1 seconds (ref 11.4–15.2)

## 2020-10-28 MED ORDER — WARFARIN SODIUM 5 MG PO TABS: 7.5000 mg | ORAL_TABLET | Freq: Every day | ORAL | Status: DC

## 2020-10-28 MED ORDER — FUROSEMIDE 40 MG PO TABS: 40.0000 mg | ORAL_TABLET | Freq: Every day | ORAL | Status: DC

## 2020-10-28 MED ORDER — METOPROLOL SUCCINATE ER 25 MG PO TB24
25.0000 mg | ORAL_TABLET | Freq: Once | ORAL | Status: AC
  Administered 2020-10-28: 25 mg via ORAL
  Filled 2020-10-28: qty 1

## 2020-10-28 MED ORDER — METOPROLOL SUCCINATE ER 50 MG PO TB24: 50.0000 mg | ORAL_TABLET | Freq: Every day | ORAL | Status: AC

## 2020-10-28 MED ORDER — METOPROLOL SUCCINATE ER 25 MG PO TB24: 25.0000 mg | ORAL_TABLET | Freq: Every day | ORAL | Status: DC

## 2020-10-28 MED ORDER — METOPROLOL SUCCINATE ER 50 MG PO TB24: 50.0000 mg | ORAL_TABLET | Freq: Every day | ORAL | Status: DC

## 2020-10-28 MED ORDER — WARFARIN SODIUM 5 MG PO TABS
10.0000 mg | ORAL_TABLET | Freq: Once | ORAL | Status: AC
  Administered 2020-10-28: 10 mg via ORAL
  Filled 2020-10-28: qty 2

## 2020-10-28 NOTE — TOC Transition Note (Signed)
Transition of Care [facility]) - CM/SW Discharge Note   Patient Details  Name: Benjamin Cordova MRN: [id number] Date of Birth: Jun 22, 1934  Transition of [facility]) CM/SW Contact:  Boneta Lucks, RN Phone Number: 10/28/2020, 1:02 PM   Clinical Narrative:   Patient is medically ready to discharge. Tonya with [place] provided number for report and room number. Medical necessity printed. Chadrick his son will go home to get vaccine card, [facility] will fax to [place].  Patient is vaccinated x4. Patient is on 2 liters of oxygen. Tonya updated.   Final next level of care: Skilled Nursing Facility Barriers to Discharge: Barriers Resolved   Patient Goals and CMS Choice Patient states their goals for this hospitalization and ongoing recovery are:: Rehab with SNF CMS Medicare.gov Compare Post Acute Care list provided to:: Patient Represenative (must comment) Choice offered to / list presented to : Adult Children  Discharge Placement              Patient chooses bed at:  ([place] Patient to be transferred to facility by: EMS Name of family member notified: Son - Abdulmajid Patient and family notified of of transfer: 10/28/20  Discharge Plan and Services      Readmission Risk Interventions Readmission Risk Prevention Plan 10/11/2020 07/04/2019  Transportation Screening Complete Complete  PCP or Specialist Appt within 3-5 Days Complete Not Complete  HRI or Home Care Consult Complete Complete  Social Work Consult for [place] Planning/Counseling Complete Complete  Palliative Care Screening Not Applicable Not Applicable  Medication Review Press photographer) Complete Complete  Some recent data might be hidden

## 2020-10-28 NOTE — Progress Notes (Signed)
ANTICOAGULATION CONSULT NOTE -   Pharmacy Consult for warfarin Indication: atrial fibrillation  No Known Allergies  Patient Measurements: Height: '5\' 8"'$  (172.7 cm) Weight: 105.6 kg (232 lb 12.9 oz) IBW/kg (Calculated) : 68.4 Heparin Dosing Weight: 91.3 kg  Vital Signs: Temp: 98.1 F (36.7 C) (07/29 0433) Temp Source: Oral (07/29 0433) BP: 111/70 (07/29 0433) Pulse Rate: 100 (07/29 0433)  Labs: Recent Labs    10/26/20 0552 10/26/20 1839 10/27/20 0603 10/27/20 1201 10/28/20 0653  HGB 10.7*  --   --  11.7* 12.2*  HCT 33.8*  --   --  36.6* 39.5  PLT 179  --   --  212 201  LABPROT  --  13.1 14.1  --  15.1  INR  --  1.0 1.1  --  1.2  CREATININE 0.79  --  0.80  --  0.83     Estimated Creatinine Clearance: 79.5 mL/min (by C-G formula based on SCr of 0.83 mg/dL).   Medical History: Past Medical History:  Diagnosis Date   Arthritis    Coronary artery disease    a. s/p CABG in 1981 b. cath in 10/2014 showing occluded grafts --> redo CABG in 2016 LIMA-LAD and Left Radial-OM2   Essential hypertension    Hematuria    History of blood transfusion 07/2015   Knee replacement HgB <9   History of pneumonia    History of stroke    Ischemic cardiomyopathy    Left ventricular apical thrombus    Mixed hyperlipidemia    Myocardial infarction [facility]) 1980   Peripheral vascular disease ([place])    Polio osteopathy of lower leg (HCC) Age 86   Affected right leg   Skin cancer    TIA (transient ischemic attack) 1989   Chronic coumadin   UTI (lower urinary tract infection)     Medications:  Medications Prior to Admission  Medication Sig Dispense Refill Last Dose   acetaminophen (TYLENOL) 325 MG tablet Take 2 tablets (650 mg total) by mouth every 6 (six) hours.   PRN   albuterol (PROVENTIL) (2.5 MG/3ML) 0.083% nebulizer solution Take 2.5 mg by nebulization every 6 (six) hours as needed for wheezing or shortness of breath.    PRN   ascorbic acid (VITAMIN C) 500 MG tablet Take 1,000 mg  by mouth 2 (two) times daily.   10/23/2020   diphenhydrAMINE (BENADRYL) 50 MG capsule Take 1 capsule (50 mg total) by mouth at bedtime as needed for sleep. 30 capsule 0 PRN   finasteride (PROSCAR) 5 MG tablet Take 5 mg by mouth daily.   99991111   folic acid (FOLVITE) Q000111Q MCG tablet Take 800 mcg by mouth daily.   10/23/2020   furosemide (LASIX) 40 MG tablet Take 1 tablet (40 mg total) by mouth daily. 30 tablet  10/23/2020   ipratropium-albuterol (DUONEB) 0.5-2.5 (3) MG/3ML SOLN Take 3 mLs by nebulization every 4 (four) hours as needed (SOB/wheezing).   10/23/2020   metoprolol succinate (TOPROL-XL) 25 MG 24 hr tablet Take 0.5 tablets (12.5 mg total) by mouth daily. (Patient taking differently: Take 25 mg by mouth daily.)   10/23/2020 at 0600   Multiple Vitamins-Minerals (CERTAVITE/ANTIOXIDANTS) TABS Take 1 tablet by mouth daily.    10/23/2020   oxyCODONE (OXY IR/ROXICODONE) 5 MG immediate release tablet Take 1 tablet (5 mg total) by mouth every 6 (six) hours as needed for severe pain. 20 tablet 0 10/13/2020   potassium chloride (K-DUR) 10 MEQ tablet Take 10 mEq by mouth daily. Take with Lasix (  Furosemide)   10/23/2020   senna-docusate (SENOKOT-S) 8.6-50 MG tablet Take 1 tablet by mouth at bedtime.   10/22/2020   simvastatin (ZOCOR) 40 MG tablet Take 40 mg by mouth at bedtime.    10/22/2020   spironolactone (ALDACTONE) 25 MG tablet Take 0.5 tablets (12.5 mg total) by mouth daily. 45 tablet 3 10/23/2020   tamsulosin (FLOMAX) 0.4 MG CAPS capsule Take 0.4 mg by mouth in the morning.    10/23/2020   warfarin (COUMADIN) 5 MG tablet Take 1.5 tablets (7.5 mg total) by mouth daily at 6 PM. Take 10 mg every night except Friday take 5 mg (Patient taking differently: Take 10 mg by mouth daily at 6 PM.)   10/23/2020 at 1600    Assessment: Benjamin Cordova is an 86 yoM with history of Afib on chronic anticoagulation with warfarin. Pharmacy consulted to restart warfarin. Patients last dose per PTA med list was 7/24. Unsure of  compliance as INR here is subtherapeutic at 1.   PTA warfarin dose documented as 10 mg daily although was therapeutic recent admission on 7.5 mg daily.  Goal of Therapy:  INR 2-3 Monitor platelets by anticoagulation protocol: Yes    Plan:  Give warfarin 10 mg po x 1 Monitor daily INR and s/s of bleeding.   Thank you for allowing [place] to participate in this patients care.  Margot Ables, PharmD Clinical Pharmacist 10/28/2020 8:57 AM

## 2020-10-28 NOTE — Care Management Important Message (Signed)
Important Message  Patient Details  Name: Benjamin Cordova MRN: [id number] Date of Birth: 1934/07/26   Medicare Important Message Given:  Yes     [name] 10/28/2020, 11:52 AM

## 2020-10-28 NOTE — Discharge Summary (Signed)
Physician Discharge Summary  Benjamin Cordova [id number] DOB: 1934/05/03 DOA: 10/23/2020  PCP: Pablo Lawrence, NP  Admit date: 10/23/2020 Discharge date: 10/28/2020  Admitted From: Home Disposition:  SNF  Recommendations for Outpatient Follow-up:  Follow up with PCP in 1-2 weeks Please obtain BMP/CBC in one week Please give coumadin 7.5 mg daily start 10/29/20 Please check INR on 10/31/20 and adjust warfarin for INR targe 2/3 Keep patient on 2L [place] and wean to RA for saturation >92%    Discharge Condition: Stable CODE STATUS: Diet recommendation: Heart Healthy    Brief/Interim Summary: 86 y.o. male, with history of coronary artery disease status post CABG, hypertension, stroke, ischemic cardiomyopathy, peripheral vascular disease, and more presents the ED with a chief complaint of hypoxia.  Son at bedside provides the history as patient is altered and BiPAP.  Son reports that patient had been here after a fall that was mechanical.  He used to ambulate with a walker, toppled over to the side with a walker, and had broken ribs.  He was admitted at that time, discharged to the [facility].  Patient then developed pneumonia, and was admitted to [facility]  Today he was going to go to [facility] but they were on diversion so they came to the ED.  In ER patient respiratory distress.  Patient been complaining of shortness of breath since he was diagnosed with pneumonia, and it seems to be constant.  Then today he had sudden onset of worsening shortness of breath, and hypoxia.  Son does report that granddaughter of patient had been to visit him and provided him with a salty burger meal.  He was given a breathing treatment at [facility], and his O2 sats were just were not coming up, so they brought him to the ED.  According to signout, EMS arrived to find patient's oxygen saturation was in the 30s.  To 's knowledge patient has not complained of chest pain.  He is only recently been off  antibiotics for pneumonia and UTI.  Apparently since has been back to the [facility] he was only there for 2 days.  Son is unsure what antibiotics were prescribed last time.   In the ED Temp is 98, patient is tachycardic with a heart rate of 107-117, respiratory rate 24-30, blood pressure 124/71, satting at 99% on 100% BiPAP Blood gas shows a pH of 7.260, PCO2 82.4 White blood cell count 17.4, hemoglobin 11.9 Chemistry panel mostly unremarkable aside from a hyperglycemia at 238 BNP is 386 [place] slightly uptrending from 34-43 UA is borderline, urine culture pending Blood culture pending Chest x-ray shows signs consistent with CHF EKG shows a heart rate of 115, sinus tach, QTC 464 Patient was started on vancomycin, cefepime, Lasix 40 mg IV.  ABx were subsequently stopped on 7/25 and patient remained clinically stable and improving with diuresis  Discharge Diagnoses:  acute repiratory failure with hypoxia -CHF exacerbation in the setting of diet indiscretion -Continue to follow daily weights, strict I's and O's and low-sodium diet -Continue IV lasix 40 mg IV bid>>d/c with lasix 40 mg po daily -Continue to wean off oxygen supplementation as tolerated and follow clinical response. -Patient responding appropriately. -Follow renal function electrolytes closely while providing diuresis -initially on 4L>>>2L -wean oxygen to RA for saturation >92%   Acute on chronic systolic CHF -[id number] Echo EF 20%, +WMA -continue IV lasix bid >>d/c with lasix 40 mg po daily -restart metoprolol succinate and spironolactone   Paroxysmal Afib -restart coumadin -dose  coumadin 7.5 mg daily start 7/30 -check INR on 10/31/20 and adjust warfarin for INR 2-3 -rate controlled -restart metoprolol succinate.--increase to 50 mg daily   Leukocytosis -Without clear source of infection currently -Continue holding on antibiotics -Follow culture results--neg -likely stress demargination -overall improved -remains  afebrile and hemodynamically stable   Hypertension -stable and well-controlled -Continue current metoprolol and spironolactone   troponin elevation -Flat in the setting of demand ischemia -Patient denies chest pain -Continue telemetry monitoring and treatment for CHF exacerbation.   Hyperglycemia/Impaired glucose tolerance -No prior history of diabetes -10/24/20 A1C--5.5 -DisContinue sliding scale insulin   acute metabolic encephalopathy -In the setting of hypoxia and acute CHF exacerbation -Mentation is back to baseline -Continue supportive care -Wean off oxygen supplementation as tolerated.   Obesity -BMI 34.96 noted on 10/27/20 -lifestyle modification   Discharge Instructions   Allergies as of 10/28/2020   No Known Allergies      Medication List     STOP taking these medications    oxyCODONE 5 MG immediate release tablet Commonly known as: Oxy IR/ROXICODONE       TAKE these medications    acetaminophen 325 MG tablet Commonly known as: TYLENOL Take 2 tablets (650 mg total) by mouth every 6 (six) hours.   albuterol (2.5 MG/3ML) 0.083% nebulizer solution Commonly known as: PROVENTIL Take 2.5 mg by nebulization every 6 (six) hours as needed for wheezing or shortness of breath.   ascorbic acid 500 MG tablet Commonly known as: VITAMIN C Take 1,000 mg by mouth 2 (two) times daily.   CertaVite/Antioxidants Tabs Take 1 tablet by mouth daily.   diphenhydrAMINE 50 MG capsule Commonly known as: BENADRYL Take 1 capsule (50 mg total) by mouth at bedtime as needed for sleep.   finasteride 5 MG tablet Commonly known as: PROSCAR Take 5 mg by mouth daily.   folic acid Q000111Q MCG tablet Commonly known as: FOLVITE Take 800 mcg by mouth daily.   furosemide 40 MG tablet Commonly known as: LASIX Take 1 tablet (40 mg total) by mouth daily.   ipratropium-albuterol 0.5-2.5 (3) MG/3ML Soln Commonly known as: DUONEB Take 3 mLs by nebulization every 4 (four) hours as  needed (SOB/wheezing).   metoprolol succinate 50 MG 24 hr tablet Commonly known as: TOPROL-XL Take 1 tablet (50 mg total) by mouth daily. Take with or immediately following a meal. Start taking on: October 29, 2020 What changed:  medication strength how much to take additional instructions   potassium chloride 10 MEQ tablet Commonly known as: KLOR-CON Take 10 mEq by mouth daily. Take with Lasix (Furosemide)   senna-docusate 8.6-50 MG tablet Commonly known as: Senokot-S Take 1 tablet by mouth at bedtime.   simvastatin 40 MG tablet Commonly known as: ZOCOR Take 40 mg by mouth at bedtime.   spironolactone 25 MG tablet Commonly known as: ALDACTONE Take 0.5 tablets (12.5 mg total) by mouth daily.   tamsulosin 0.4 MG Caps capsule Commonly known as: FLOMAX Take 0.4 mg by mouth in the morning.   warfarin 5 MG tablet Commonly known as: COUMADIN Take 1.5 tablets (7.5 mg total) by mouth daily at 6 PM. What changed: additional instructions        No Known Allergies  Consultations: none   Procedures/Studies: DG Ribs Unilateral W/Chest Right  Result Date: 10/05/2020 CLINICAL DATA:  Right chest pain after a fall today. Initial encounter. EXAM: RIGHT RIBS AND CHEST - 3+ VIEW COMPARISON:  CT chest and single view of the chest 09/15/2019. FINDINGS: The patient  has acute fractures of right sixth and seventh ribs. The right sixth rib fracture is nondisplaced. There is mild superior displacement of the distal fragment of the seventh rib. Small left pleural effusion and chronic basilar atelectasis appear unchanged. Heart size is upper normal. The patient is status post CABG. No pneumothorax is identified. IMPRESSION: Acute right sixth and seventh rib fractures. Negative for pneumothorax. No change in a small left pleural effusion and basilar atelectasis. Electronically Signed   By: Inge Rise M.D.   On: 10/05/2020 14:14   DG Shoulder Right  Result Date: 10/05/2020 CLINICAL DATA:   Status post fall.  Right shoulder pain. EXAM: RIGHT SHOULDER - 2+ VIEW COMPARISON:  None. FINDINGS: There is no evidence of fracture or dislocation. Mild degenerative changes noted at the acromioclavicular joint. There is an acute fracture involving the posterolateral aspect of the approximate right seventh rib. Soft tissues are unremarkable. IMPRESSION: Acute fracture involves the posterolateral aspect of the approximate right seventh rib. No signs of shoulder fracture. Electronically Signed   By: Kerby Moors M.D.   On: 10/05/2020 13:44   DG Chest Port 1 View  Result Date: 10/25/2020 CLINICAL DATA:  Shortness of breath. EXAM: PORTABLE CHEST 1 VIEW COMPARISON:  10/23/2020.  07/24/2019. FINDINGS: Prior CABG. Stable cardiomegaly. Interim near complete resolution of pulmonary interstitial prominence suggesting resolving interstitial edema. Underlying chronic interstitial disease and pleuroparenchymal thickening again noted. Small bilateral pleural effusions cannot be excluded. No pneumothorax. IMPRESSION: 1. Prior CABG. Stable cardiomegaly. Interim near complete resolution of pulmonary interstitial prominence suggesting resolving interstitial edema. 2. Chronic interstitial disease and pleuroparenchymal thickening again noted. Small bilateral pleural effusions cannot be excluded. Electronically Signed   By: Marcello Moores  Register   On: 10/25/2020 08:17   DG Chest Port 1 View  Result Date: 10/23/2020 CLINICAL DATA:  Respiratory distress. History of prior CABG surgery. EXAM: PORTABLE CHEST 1 VIEW COMPARISON:  10/07/2020 and older exams.  CT, 09/15/2019. FINDINGS: Stable changes from prior CABG surgery. Cardiac silhouette is top-normal in size. No mediastinal or hilar masses. Bilateral vascular congestion and interstitial thickening similar to the most recent prior exam. There is opacity at the left lung base consistent with a pleural effusion, which may be increased compared to the most recent prior study. Possible  small right effusion. There is opacity in the peripheral left mid to upper lung associated with pleural thickening consistent with chronic scarring, stable. Surgical vascular clips overlie the left hilum, also stable. No pneumothorax. Skeletal structures are grossly intact. IMPRESSION: 1. Findings support mild congestive heart failure superimposed on chronic findings. Vascular congestion appears increased from the prior study. Left pleural effusion may be larger. Probable small right effusion. 2. Other findings detailed above are chronic and without change from the prior exam. Electronically Signed   By: Lajean Manes M.D.   On: 10/23/2020 21:55   DG CHEST PORT 1 VIEW  Result Date: 10/07/2020 CLINICAL DATA:  86 year old male with history of dyspnea and multiple right-sided rib fractures. EXAM: PORTABLE CHEST 1 VIEW COMPARISON:  Chest x-ray 10/06/2020. FINDINGS: Low lung volumes. Chronic areas of pleuroparenchymal thickening and architectural distortion and chronic left pleural effusion, similar to prior examinations. Opacity at the left base and in the left mid lung periphery corresponds to areas of chronic rounded atelectasis on prior chest CT. Right lung is clear. No right pleural effusion. No pneumothorax. No evidence of pulmonary edema. Heart size is normal. Upper mediastinal contours are within normal limits. Aortic atherosclerosis. Status post median sternotomy. Minimally displaced fractures of  the lateral aspects of the right sixth, seventh and eighth ribs again noted. IMPRESSION: 1. Acute minimally displaced fractures of the right sixth, seventh and eighth ribs. No right-sided pneumothorax. 2. Low lung volumes with chronic areas of rounded atelectasis in the left lung and chronic small left pleural effusion, similar to prior examinations. 3. Aortic atherosclerosis. Electronically Signed   By: Vinnie Langton M.D.   On: 10/07/2020 05:31   DG CHEST PORT 1 VIEW  Result Date: 10/06/2020 CLINICAL DATA:   Shortness of breath.  Known rib fractures. EXAM: PORTABLE CHEST 1 VIEW COMPARISON:  Chest x-ray 10/05/2020 FINDINGS: Chronic left pleural effusion and left lower lobe atelectasis with the appearance of rounded atelectasis on the prior chest CT. Chronic scarring changes involving the right lung. No definite acute pulmonary findings. Stable right-sided rib fractures but no pneumothorax or pleural effusion. IMPRESSION: 1. Stable chronic left pleural effusion and left lower lobe atelectasis. 2. Stable right-sided rib fractures. Electronically Signed   By: Marijo Sanes M.D.   On: 10/06/2020 15:37   ECHOCARDIOGRAM COMPLETE  Result Date: 10/07/2020    ECHOCARDIOGRAM REPORT   Patient Name:   Benjamin Cordova Date of Exam: 10/07/2020 Medical Rec #:  [id number]       Height:       68.0 in Accession #:    [id number]      Weight:       233.0 lb Date of Birth:  June 06, 1934       BSA:          2.181 m Patient Age:    86 years        BP:           95/56 mmHg Patient Gender: M               HR:           85 bpm. Exam Location:  [facility] Procedure: 2D Echo, Cardiac Doppler and Color Doppler Indications:    Dyspnea  History:        Patient has prior history of Echocardiogram examinations, most                 recent 07/01/2019. CHF, CAD, Prior CABG, Arrythmias:Atrial                 Fibrillation, Signs/Symptoms:Shortness of Breath; Risk                 Factors:Hypertension and Former Smoker.  Sonographer:    Wenda Low Referring Phys: [place] Comments: Patient refused the use of Definity. IMPRESSIONS  1. LVEF is severely depressed with severe hypokineis of the mid/distal ventricle; distal anterior, dsital inferior and apical akinesis. Note apex is not well seen in this bedside study COmpared to 2021 no significant change . Left ventricular ejection fraction, by estimation, is 20%%. The left ventricular internal cavity size was severely dilated. Left ventricular diastolic parameters are  indeterminate.  2. Right ventricular systolic function is normal. The right ventricular size is normal. There is normal pulmonary artery systolic pressure.  3. Trivial mitral valve regurgitation.  4. The aortic valve is tricuspid. Aortic valve regurgitation is not visualized. Mild aortic valve sclerosis is present, with no evidence of aortic valve stenosis. FINDINGS  Left Ventricle: LVEF is severely depressed with severe hypokineis of the mid/distal ventricle; distal anterior, dsital inferior and apical akinesis. Note apex is not well seen in this bedside study COmpared to 2021 no significant change. Left ventricular ejection  fraction, by estimation, is 20%%. The left ventricular internal cavity size was severely dilated. There is no left ventricular hypertrophy. Left ventricular diastolic parameters are indeterminate. Right Ventricle: The right ventricular size is normal. Right vetricular wall thickness was not assessed. Right ventricular systolic function is normal. There is normal pulmonary artery systolic pressure. The tricuspid regurgitant velocity is 1.72 m/s, and with an assumed right atrial pressure of 3 mmHg, the estimated right ventricular systolic pressure is [id number] mmHg. Left Atrium: Left atrial size was normal in size. Right Atrium: Right atrial size was normal in size. Pericardium: Trivial pericardial effusion is present. Mitral Valve: There is mild thickening of the mitral valve leaflet(s). Trivial mitral valve regurgitation. MV peak gradient, 5.6 mmHg. The mean mitral valve gradient is 2.0 mmHg. Tricuspid Valve: The tricuspid valve is normal in structure. Tricuspid valve regurgitation is trivial. Aortic Valve: The aortic valve is tricuspid. Aortic valve regurgitation is not visualized. Mild aortic valve sclerosis is present, with no evidence of aortic valve stenosis. Aortic valve mean gradient measures 3.0 mmHg. Aortic valve peak gradient measures 7.1 mmHg. Aortic valve area, by VTI measures 3.10 cm.  Pulmonic Valve: The pulmonic valve was normal in structure. Pulmonic valve regurgitation is not visualized. Aorta: The aortic root is normal in size and structure. IAS/Shunts: No atrial level shunt detected by color flow Doppler.  LEFT VENTRICLE PLAX 2D LVIDd:         6.31 cm  Diastology LVIDs:         5.80 cm  LV e' medial:    12.60 cm/s LV PW:         1.04 cm  LV E/e' medial:  7.5 LV IVS:        1.13 cm  LV e' lateral:   13.20 cm/s LVOT diam:     2.10 cm  LV E/e' lateral: 7.2 LV SV:         69 LV SV Index:   32 LVOT Area:     3.46 cm  RIGHT VENTRICLE RV Basal diam:  3.62 cm TAPSE (M-mode): 2.4 cm LEFT ATRIUM             Index       RIGHT ATRIUM           Index LA diam:        4.50 cm 2.06 cm/m  RA Area:     15.40 cm LA Vol (A2C):   66.7 ml 30.58 ml/m RA Volume:   34.80 ml  15.96 ml/m LA Vol (A4C):   65.5 ml 30.03 ml/m LA Biplane Vol: 67.7 ml 31.04 ml/m  AORTIC VALVE AV Area (Vmax):    2.73 cm AV Area (Vmean):   2.79 cm AV Area (VTI):     3.10 cm AV Vmax:           133.00 cm/s AV Vmean:          78.800 cm/s AV VTI:            0.222 m AV Peak Grad:      7.1 mmHg AV Mean Grad:      3.0 mmHg LVOT Vmax:         105.00 cm/s LVOT Vmean:        63.500 cm/s LVOT VTI:          0.199 m LVOT/AV VTI ratio: 0.90  AORTA Ao Root diam: 3.40 cm Ao Asc diam:  3.10 cm MITRAL VALVE  TRICUSPID VALVE MV Area (PHT): 5.97 cm    TR Peak grad:   11.8 mmHg MV Area VTI:   3.57 cm    TR Vmax:        172.00 cm/s MV Peak grad:  5.6 mmHg MV Mean grad:  2.0 mmHg    SHUNTS MV Vmax:       1.18 m/s    Systemic VTI:  0.20 m MV Vmean:      60.6 cm/s   Systemic Diam: 2.10 cm MV Decel Time: 127 msec MV E velocity: 94.60 cm/s Dorris Carnes MD Electronically signed by Dorris Carnes MD Signature Date/Time: 10/07/2020/4:22:38 PM    Final         Discharge Exam: Vitals:   10/27/20 2021 10/28/20 0433  BP: 108/65 111/70  Pulse: (!) 108 100  Resp: 17 18  Temp: 97.6 F (36.4 C) 98.1 F (36.7 C)  SpO2: 95% 90%   Vitals:    10/27/20 1359 10/27/20 2021 10/28/20 0401 10/28/20 0433  BP: 113/69 108/65  111/70  Pulse: 99 (!) 108  100  Resp: '18 17  18  '$ Temp: 98.5 F (36.9 C) 97.6 F (36.4 C)  98.1 F (36.7 C)  TempSrc: Oral Oral  Oral  SpO2: 94% 95%  90%  Weight:   105.6 kg   Height:        General: Pt is alert, awake, not in acute distress Cardiovascular: IRRR, S1/S2 +, no rubs, no gallops Respiratory: fine bibasilar rales. No wheeze Abdominal: Soft, NT, ND, bowel sounds + Extremities: no edema, no cyanosis   The results of significant diagnostics from this hospitalization (including imaging, microbiology, ancillary and laboratory) are listed below for reference.    Significant Diagnostic Studies: DG Ribs Unilateral W/Chest Right  Result Date: 10/05/2020 CLINICAL DATA:  Right chest pain after a fall today. Initial encounter. EXAM: RIGHT RIBS AND CHEST - 3+ VIEW COMPARISON:  CT chest and single view of the chest 09/15/2019. FINDINGS: The patient has acute fractures of right sixth and seventh ribs. The right sixth rib fracture is nondisplaced. There is mild superior displacement of the distal fragment of the seventh rib. Small left pleural effusion and chronic basilar atelectasis appear unchanged. Heart size is upper normal. The patient is status post CABG. No pneumothorax is identified. IMPRESSION: Acute right sixth and seventh rib fractures. Negative for pneumothorax. No change in a small left pleural effusion and basilar atelectasis. Electronically Signed   By: Inge Rise M.D.   On: 10/05/2020 14:14   DG Shoulder Right  Result Date: 10/05/2020 CLINICAL DATA:  Status post fall.  Right shoulder pain. EXAM: RIGHT SHOULDER - 2+ VIEW COMPARISON:  None. FINDINGS: There is no evidence of fracture or dislocation. Mild degenerative changes noted at the acromioclavicular joint. There is an acute fracture involving the posterolateral aspect of the approximate right seventh rib. Soft tissues are unremarkable.  IMPRESSION: Acute fracture involves the posterolateral aspect of the approximate right seventh rib. No signs of shoulder fracture. Electronically Signed   By: Kerby Moors M.D.   On: 10/05/2020 13:44   DG Chest Port 1 View  Result Date: 10/25/2020 CLINICAL DATA:  Shortness of breath. EXAM: PORTABLE CHEST 1 VIEW COMPARISON:  10/23/2020.  07/24/2019. FINDINGS: Prior CABG. Stable cardiomegaly. Interim near complete resolution of pulmonary interstitial prominence suggesting resolving interstitial edema. Underlying chronic interstitial disease and pleuroparenchymal thickening again noted. Small bilateral pleural effusions cannot be excluded. No pneumothorax. IMPRESSION: 1. Prior CABG. Stable cardiomegaly. Interim near complete resolution of pulmonary  interstitial prominence suggesting resolving interstitial edema. 2. Chronic interstitial disease and pleuroparenchymal thickening again noted. Small bilateral pleural effusions cannot be excluded. Electronically Signed   By: Marcello Moores  Register   On: 10/25/2020 08:17   DG Chest Port 1 View  Result Date: 10/23/2020 CLINICAL DATA:  Respiratory distress. History of prior CABG surgery. EXAM: PORTABLE CHEST 1 VIEW COMPARISON:  10/07/2020 and older exams.  CT, 09/15/2019. FINDINGS: Stable changes from prior CABG surgery. Cardiac silhouette is top-normal in size. No mediastinal or hilar masses. Bilateral vascular congestion and interstitial thickening similar to the most recent prior exam. There is opacity at the left lung base consistent with a pleural effusion, which may be increased compared to the most recent prior study. Possible small right effusion. There is opacity in the peripheral left mid to upper lung associated with pleural thickening consistent with chronic scarring, stable. Surgical vascular clips overlie the left hilum, also stable. No pneumothorax. Skeletal structures are grossly intact. IMPRESSION: 1. Findings support mild congestive heart failure  superimposed on chronic findings. Vascular congestion appears increased from the prior study. Left pleural effusion may be larger. Probable small right effusion. 2. Other findings detailed above are chronic and without change from the prior exam. Electronically Signed   By: Lajean Manes M.D.   On: 10/23/2020 21:55   DG CHEST PORT 1 VIEW  Result Date: 10/07/2020 CLINICAL DATA:  86 year old male with history of dyspnea and multiple right-sided rib fractures. EXAM: PORTABLE CHEST 1 VIEW COMPARISON:  Chest x-ray 10/06/2020. FINDINGS: Low lung volumes. Chronic areas of pleuroparenchymal thickening and architectural distortion and chronic left pleural effusion, similar to prior examinations. Opacity at the left base and in the left mid lung periphery corresponds to areas of chronic rounded atelectasis on prior chest CT. Right lung is clear. No right pleural effusion. No pneumothorax. No evidence of pulmonary edema. Heart size is normal. Upper mediastinal contours are within normal limits. Aortic atherosclerosis. Status post median sternotomy. Minimally displaced fractures of the lateral aspects of the right sixth, seventh and eighth ribs again noted. IMPRESSION: 1. Acute minimally displaced fractures of the right sixth, seventh and eighth ribs. No right-sided pneumothorax. 2. Low lung volumes with chronic areas of rounded atelectasis in the left lung and chronic small left pleural effusion, similar to prior examinations. 3. Aortic atherosclerosis. Electronically Signed   By: Vinnie Langton M.D.   On: 10/07/2020 05:31   DG CHEST PORT 1 VIEW  Result Date: 10/06/2020 CLINICAL DATA:  Shortness of breath.  Known rib fractures. EXAM: PORTABLE CHEST 1 VIEW COMPARISON:  Chest x-ray 10/05/2020 FINDINGS: Chronic left pleural effusion and left lower lobe atelectasis with the appearance of rounded atelectasis on the prior chest CT. Chronic scarring changes involving the right lung. No definite acute pulmonary findings.  Stable right-sided rib fractures but no pneumothorax or pleural effusion. IMPRESSION: 1. Stable chronic left pleural effusion and left lower lobe atelectasis. 2. Stable right-sided rib fractures. Electronically Signed   By: Marijo Sanes M.D.   On: 10/06/2020 15:37   ECHOCARDIOGRAM COMPLETE  Result Date: 10/07/2020    ECHOCARDIOGRAM REPORT   Patient Name:   Benjamin Cordova Date of Exam: 10/07/2020 Medical Rec #:  [id number]       Height:       68.0 in Accession #:    [id number]      Weight:       233.0 lb Date of Birth:  1934/06/29       BSA:  2.181 m Patient Age:    70 years        BP:           95/56 mmHg Patient Gender: M               HR:           85 bpm. Exam Location:  [facility] Procedure: 2D Echo, Cardiac Doppler and Color Doppler Indications:    Dyspnea  History:        Patient has prior history of Echocardiogram examinations, most                 recent 07/01/2019. CHF, CAD, Prior CABG, Arrythmias:Atrial                 Fibrillation, Signs/Symptoms:Shortness of Breath; Risk                 Factors:Hypertension and Former Smoker.  Sonographer:    Wenda Low Referring Phys: [place] Comments: Patient refused the use of Definity. IMPRESSIONS  1. LVEF is severely depressed with severe hypokineis of the mid/distal ventricle; distal anterior, dsital inferior and apical akinesis. Note apex is not well seen in this bedside study COmpared to 2021 no significant change . Left ventricular ejection fraction, by estimation, is 20%%. The left ventricular internal cavity size was severely dilated. Left ventricular diastolic parameters are indeterminate.  2. Right ventricular systolic function is normal. The right ventricular size is normal. There is normal pulmonary artery systolic pressure.  3. Trivial mitral valve regurgitation.  4. The aortic valve is tricuspid. Aortic valve regurgitation is not visualized. Mild aortic valve sclerosis is present, with no evidence of aortic  valve stenosis. FINDINGS  Left Ventricle: LVEF is severely depressed with severe hypokineis of the mid/distal ventricle; distal anterior, dsital inferior and apical akinesis. Note apex is not well seen in this bedside study COmpared to 2021 no significant change. Left ventricular ejection fraction, by estimation, is 20%%. The left ventricular internal cavity size was severely dilated. There is no left ventricular hypertrophy. Left ventricular diastolic parameters are indeterminate. Right Ventricle: The right ventricular size is normal. Right vetricular wall thickness was not assessed. Right ventricular systolic function is normal. There is normal pulmonary artery systolic pressure. The tricuspid regurgitant velocity is 1.72 m/s, and with an assumed right atrial pressure of 3 mmHg, the estimated right ventricular systolic pressure is [id number] mmHg. Left Atrium: Left atrial size was normal in size. Right Atrium: Right atrial size was normal in size. Pericardium: Trivial pericardial effusion is present. Mitral Valve: There is mild thickening of the mitral valve leaflet(s). Trivial mitral valve regurgitation. MV peak gradient, 5.6 mmHg. The mean mitral valve gradient is 2.0 mmHg. Tricuspid Valve: The tricuspid valve is normal in structure. Tricuspid valve regurgitation is trivial. Aortic Valve: The aortic valve is tricuspid. Aortic valve regurgitation is not visualized. Mild aortic valve sclerosis is present, with no evidence of aortic valve stenosis. Aortic valve mean gradient measures 3.0 mmHg. Aortic valve peak gradient measures 7.1 mmHg. Aortic valve area, by VTI measures 3.10 cm. Pulmonic Valve: The pulmonic valve was normal in structure. Pulmonic valve regurgitation is not visualized. Aorta: The aortic root is normal in size and structure. IAS/Shunts: No atrial level shunt detected by color flow Doppler.  LEFT VENTRICLE PLAX 2D LVIDd:         6.31 cm  Diastology LVIDs:         5.80 cm  LV e' medial:  12.60 cm/s LV  PW:         1.04 cm  LV E/e' medial:  7.5 LV IVS:        1.13 cm  LV e' lateral:   13.20 cm/s LVOT diam:     2.10 cm  LV E/e' lateral: 7.2 LV SV:         69 LV SV Index:   32 LVOT Area:     3.46 cm  RIGHT VENTRICLE RV Basal diam:  3.62 cm TAPSE (M-mode): 2.4 cm LEFT ATRIUM             Index       RIGHT ATRIUM           Index LA diam:        4.50 cm 2.06 cm/m  RA Area:     15.40 cm LA Vol (A2C):   66.7 ml 30.58 ml/m RA Volume:   34.80 ml  15.96 ml/m LA Vol (A4C):   65.5 ml 30.03 ml/m LA Biplane Vol: 67.7 ml 31.04 ml/m  AORTIC VALVE AV Area (Vmax):    2.73 cm AV Area (Vmean):   2.79 cm AV Area (VTI):     3.10 cm AV Vmax:           133.00 cm/s AV Vmean:          78.800 cm/s AV VTI:            0.222 m AV Peak Grad:      7.1 mmHg AV Mean Grad:      3.0 mmHg LVOT Vmax:         105.00 cm/s LVOT Vmean:        63.500 cm/s LVOT VTI:          0.199 m LVOT/AV VTI ratio: 0.90  AORTA Ao Root diam: 3.40 cm Ao Asc diam:  3.10 cm MITRAL VALVE               TRICUSPID VALVE MV Area (PHT): 5.97 cm    TR Peak grad:   11.8 mmHg MV Area VTI:   3.57 cm    TR Vmax:        172.00 cm/s MV Peak grad:  5.6 mmHg MV Mean grad:  2.0 mmHg    SHUNTS MV Vmax:       1.18 m/s    Systemic VTI:  0.20 m MV Vmean:      60.6 cm/s   Systemic Diam: 2.10 cm MV Decel Time: 127 msec MV E velocity: 94.60 cm/s Dorris Carnes MD Electronically signed by Dorris Carnes MD Signature Date/Time: 10/07/2020/4:22:38 PM    Final     Microbiology: Recent Results (from the past 240 hour(s))  Resp Panel by RT-PCR (Flu A&B, Covid) Nasopharyngeal Swab     Status: None   Collection Time: 10/23/20  9:20 PM   Specimen: Nasopharyngeal Swab; Nasopharyngeal(NP) swabs in vial transport medium  Result Value Ref Range Status   SARS Coronavirus 2 by RT PCR NEGATIVE NEGATIVE Final    Comment: (NOTE) SARS-CoV-2 target nucleic acids are NOT DETECTED.  The SARS-CoV-2 RNA is generally detectable in upper respiratory specimens during the acute phase of infection. The  lowest concentration of SARS-CoV-2 viral copies this assay can detect is 138 copies/mL. A negative result does not preclude SARS-Cov-2 infection and should not be used as the sole basis for treatment or other patient management decisions. A negative result may occur with  improper specimen collection/handling, submission of specimen other than  nasopharyngeal swab, presence of viral mutation(s) within the areas targeted by this assay, and inadequate number of viral copies(<138 copies/mL). A negative result must be combined with clinical observations, patient history, and epidemiological information. The expected result is Negative.  Fact Sheet for Patients:  EntrepreneurPulse.com.au  Fact Sheet for Healthcare Providers:  IncredibleEmployment.be  This test is no t yet approved or cleared by the [place] FDA and  has been authorized for detection and/or diagnosis of SARS-CoV-2 by FDA under an Emergency Use Authorization (EUA). This EUA will remain  in effect (meaning this test can be used) for the duration of the COVID-19 declaration under Section 564(b)(1) of the Act, 21 U.S.C.section 360bbb-3(b)(1), unless the authorization is terminated  or revoked sooner.       Influenza A by PCR NEGATIVE NEGATIVE Final   Influenza B by PCR NEGATIVE NEGATIVE Final    Comment: (NOTE) The Xpert Xpress SARS-CoV-2/FLU/RSV plus assay is intended as an aid in the diagnosis of influenza from Nasopharyngeal swab specimens and should not be used as a sole basis for treatment. Nasal washings and aspirates are unacceptable for Xpert Xpress SARS-CoV-2/FLU/RSV testing.  Fact Sheet for Patients: EntrepreneurPulse.com.au  Fact Sheet for Healthcare Providers: IncredibleEmployment.be  This test is not yet approved or cleared by the [place] FDA and has been authorized for detection and/or diagnosis of SARS-CoV-2 by FDA under  an Emergency Use Authorization (EUA). This EUA will remain in effect (meaning this test can be used) for the duration of the COVID-19 declaration under Section 564(b)(1) of the Act, 21 U.S.C. section 360bbb-3(b)(1), unless the authorization is terminated or revoked.  Performed at [facility], [address], [place], [place] [address]   Urine Culture     Status: None   Collection Time: 10/23/20  9:55 PM   Specimen: In/Out Cath Urine  Result Value Ref Range Status   Specimen Description   Final    IN/OUT CATH URINE Performed at [facility], [address] [place], [place] [address]    Special Requests   Final    NONE Performed at [facility], [address] [place], [place] [address]    Culture   Final    NO GROWTH Performed at [facility], [place] [address] [place], [place] [address]    Report Status 10/25/2020 FINAL  Final  Blood culture (routine single)     Status: None   Collection Time: 10/23/20 11:26 PM   Specimen: Right Antecubital; Blood  Result Value Ref Range Status   Specimen Description RIGHT ANTECUBITAL  Final   Special Requests   Final    BOTTLES DRAWN AEROBIC AND ANAEROBIC Blood Culture adequate volume   Culture   Final    NO GROWTH 5 DAYS Performed at [facility], [address] [place], [place] [address]    Report Status 10/28/2020 FINAL  Final  Culture, blood (Routine X 2) w Reflex to ID Panel     Status: None (Preliminary result)   Collection Time: 10/24/20  1:21 AM   Specimen: Right Antecubital  Result Value Ref Range Status   Specimen Description RIGHT ANTECUBITAL  Final   Special Requests   Final    BOTTLES DRAWN AEROBIC AND ANAEROBIC Blood Culture adequate volume   Culture   Final    NO GROWTH 4 DAYS Performed at [facility], [address] [place], [place] [address]    Report Status PENDING  Incomplete  MRSA Next Gen by PCR, Nasal     Status: None  Collection Time: 10/24/20  2:32 AM   Specimen: Nasal Mucosa; Nasal Swab  Result Value  Ref Range Status   MRSA by PCR Next Gen NOT DETECTED NOT DETECTED Final    Comment: (NOTE) The GeneXpert MRSA Assay (FDA approved for NASAL specimens only), is one component of a comprehensive MRSA colonization surveillance program. It is not intended to diagnose MRSA infection nor to guide or monitor treatment for MRSA infections. Test performance is not FDA approved in patients less than 50 years old. Performed at [facility], [address] [place], [place] [address]   Resp Panel by RT-PCR (Flu A&B, Covid) Nasopharyngeal Swab     Status: None   Collection Time: 10/26/20 11:25 AM   Specimen: Nasopharyngeal Swab; Nasopharyngeal(NP) swabs in vial transport medium  Result Value Ref Range Status   SARS Coronavirus 2 by RT PCR NEGATIVE NEGATIVE Final    Comment: (NOTE) SARS-CoV-2 target nucleic acids are NOT DETECTED.  The SARS-CoV-2 RNA is generally detectable in upper respiratory specimens during the acute phase of infection. The lowest concentration of SARS-CoV-2 viral copies this assay can detect is 138 copies/mL. A negative result does not preclude SARS-Cov-2 infection and should not be used as the sole basis for treatment or other patient management decisions. A negative result may occur with  improper specimen collection/handling, submission of specimen other than nasopharyngeal swab, presence of viral mutation(s) within the areas targeted by this assay, and inadequate number of viral copies(<138 copies/mL). A negative result must be combined with clinical observations, patient history, and epidemiological information. The expected result is Negative.  Fact Sheet for Patients:  EntrepreneurPulse.com.au  Fact Sheet for Healthcare Providers:  IncredibleEmployment.be  This test is no t yet approved or cleared by the [place] FDA and  has been authorized for detection and/or diagnosis of SARS-CoV-2 by FDA under an Emergency Use  Authorization (EUA). This EUA will remain  in effect (meaning this test can be used) for the duration of the COVID-19 declaration under Section 564(b)(1) of the Act, 21 U.S.C.section 360bbb-3(b)(1), unless the authorization is terminated  or revoked sooner.       Influenza A by PCR NEGATIVE NEGATIVE Final   Influenza B by PCR NEGATIVE NEGATIVE Final    Comment: (NOTE) The Xpert Xpress SARS-CoV-2/FLU/RSV plus assay is intended as an aid in the diagnosis of influenza from Nasopharyngeal swab specimens and should not be used as a sole basis for treatment. Nasal washings and aspirates are unacceptable for Xpert Xpress SARS-CoV-2/FLU/RSV testing.  Fact Sheet for Patients: EntrepreneurPulse.com.au  Fact Sheet for Healthcare Providers: IncredibleEmployment.be  This test is not yet approved or cleared by the [place] FDA and has been authorized for detection and/or diagnosis of SARS-CoV-2 by FDA under an Emergency Use Authorization (EUA). This EUA will remain in effect (meaning this test can be used) for the duration of the COVID-19 declaration under Section 564(b)(1) of the Act, 21 U.S.C. section 360bbb-3(b)(1), unless the authorization is terminated or revoked.  Performed at [facility], [address] [place], [place] [address]      Labs: Basic Metabolic Panel: Recent Labs  Lab 10/23/20 2129 10/24/20 0502 10/26/20 0552 10/27/20 0603 10/28/20 0653  NA 139 139 136 137 139  K 4.5 4.3 3.5 3.4* 3.8  CL 97* 96* 91* 89* 91*  CO2 35* 36* 37* 38* 38*  GLUCOSE 238* 103* 112* 110* 108*  BUN 24* 25* 25* 23 31*  CREATININE 1.02 0.93 0.79 0.80 0.83  CALCIUM 8.8* 8.6* 8.9 9.2 9.3  MG  --  2.2  --  2.3  --    Liver Function Tests: Recent Labs  Lab 10/23/20 2129 10/24/20 0502  AST 18 16  ALT 31 26  ALKPHOS 105 89  BILITOT 0.3 0.5  PROT 7.1 6.7  ALBUMIN 3.8 3.4*   No results for input(s): LIPASE, AMYLASE in the last 168 hours. No  results for input(s): AMMONIA in the last 168 hours. CBC: Recent Labs  Lab 10/23/20 2129 10/26/20 0552 10/27/20 1201 10/28/20 0653  WBC 17.4* 7.9 10.3 8.9  NEUTROABS 15.2*  --   --   --   HGB 11.9* 10.7* 11.7* 12.2*  HCT 37.4* 33.8* 36.6* 39.5  MCV 98.2 96.8 96.8 97.8  PLT 301 179 212 201   Cardiac Enzymes: No results for input(s): CKTOTAL, CKMB, CKMBINDEX, TROPONINI in the last 168 hours. BNP: Invalid input(s): POCBNP CBG: Recent Labs  Lab 10/27/20 1113 10/27/20 1614 10/27/20 2019 10/28/20 0730 10/28/20 1104  GLUCAP 147* 127* 141* 114* 142*    Time coordinating discharge:  36 minutes  Signed:  Orson Eva, DO [facility] Pager: [phone] 10/28/2020, 11:32 AM

## 2020-10-29 LAB — CULTURE, BLOOD (ROUTINE X 2)
Culture: NO GROWTH
Special Requests: ADEQUATE

## 2020-10-31 DIAGNOSIS — R0600 Dyspnea, unspecified: Secondary | ICD-10-CM | POA: Diagnosis not present

## 2020-10-31 DIAGNOSIS — R2243 Localized swelling, mass and lump, lower limb, bilateral: Secondary | ICD-10-CM | POA: Diagnosis not present

## 2020-10-31 DIAGNOSIS — Z9981 Dependence on supplemental oxygen: Secondary | ICD-10-CM | POA: Diagnosis not present

## 2020-10-31 DIAGNOSIS — Z7901 Long term (current) use of anticoagulants: Secondary | ICD-10-CM | POA: Diagnosis not present

## 2020-11-01 DIAGNOSIS — L98411 Non-pressure chronic ulcer of buttock limited to breakdown of skin: Secondary | ICD-10-CM | POA: Diagnosis not present

## 2020-11-01 DIAGNOSIS — Z9981 Dependence on supplemental oxygen: Secondary | ICD-10-CM | POA: Diagnosis not present

## 2020-11-01 DIAGNOSIS — J9610 Chronic respiratory failure, unspecified whether with hypoxia or hypercapnia: Secondary | ICD-10-CM | POA: Diagnosis not present

## 2020-11-01 DIAGNOSIS — R2243 Localized swelling, mass and lump, lower limb, bilateral: Secondary | ICD-10-CM | POA: Diagnosis not present

## 2020-11-01 DIAGNOSIS — L24A2 Irritant contact dermatitis due to fecal, urinary or dual incontinence: Secondary | ICD-10-CM | POA: Diagnosis not present

## 2020-11-01 DIAGNOSIS — R0600 Dyspnea, unspecified: Secondary | ICD-10-CM | POA: Diagnosis not present

## 2020-11-02 DIAGNOSIS — Z9981 Dependence on supplemental oxygen: Secondary | ICD-10-CM | POA: Diagnosis not present

## 2020-11-02 DIAGNOSIS — Z5181 Encounter for therapeutic drug level monitoring: Secondary | ICD-10-CM | POA: Diagnosis not present

## 2020-11-02 DIAGNOSIS — I959 Hypotension, unspecified: Secondary | ICD-10-CM | POA: Diagnosis not present

## 2020-11-02 DIAGNOSIS — R2243 Localized swelling, mass and lump, lower limb, bilateral: Secondary | ICD-10-CM | POA: Diagnosis not present

## 2020-11-02 DIAGNOSIS — I480 Paroxysmal atrial fibrillation: Secondary | ICD-10-CM | POA: Diagnosis not present

## 2020-11-02 DIAGNOSIS — Z7901 Long term (current) use of anticoagulants: Secondary | ICD-10-CM | POA: Diagnosis not present

## 2020-11-03 DIAGNOSIS — Z7901 Long term (current) use of anticoagulants: Secondary | ICD-10-CM | POA: Diagnosis not present

## 2020-11-04 DIAGNOSIS — I480 Paroxysmal atrial fibrillation: Secondary | ICD-10-CM | POA: Diagnosis not present

## 2020-11-05 DIAGNOSIS — Z7901 Long term (current) use of anticoagulants: Secondary | ICD-10-CM | POA: Diagnosis not present

## 2020-11-05 DIAGNOSIS — I480 Paroxysmal atrial fibrillation: Secondary | ICD-10-CM | POA: Diagnosis not present

## 2020-11-08 ENCOUNTER — Inpatient Hospital Stay: Payer: Medicare HMO

## 2020-11-08 ENCOUNTER — Inpatient Hospital Stay
Admission: EM | Admit: 2020-11-08 | Discharge: 2020-11-14 | DRG: 280 | Disposition: A | Discharge status: Skilled Nursing Facility | Payer: Medicare HMO | Attending: Internal Medicine | Admitting: Internal Medicine

## 2020-11-08 ENCOUNTER — Emergency Department: Payer: Medicare HMO

## 2020-11-08 ENCOUNTER — Encounter: Payer: Self-pay | Admitting: Internal Medicine

## 2020-11-08 ENCOUNTER — Ambulatory Visit: Payer: Medicare HMO | Admitting: Physician Assistant

## 2020-11-08 DIAGNOSIS — I255 Ischemic cardiomyopathy: Secondary | ICD-10-CM | POA: Diagnosis present

## 2020-11-08 DIAGNOSIS — R54 Age-related physical debility: Secondary | ICD-10-CM | POA: Diagnosis present

## 2020-11-08 DIAGNOSIS — Z20822 Contact with and (suspected) exposure to covid-19: Secondary | ICD-10-CM | POA: Diagnosis present

## 2020-11-08 DIAGNOSIS — I252 Old myocardial infarction: Secondary | ICD-10-CM

## 2020-11-08 DIAGNOSIS — R4182 Altered mental status, unspecified: Secondary | ICD-10-CM

## 2020-11-08 DIAGNOSIS — N400 Enlarged prostate without lower urinary tract symptoms: Secondary | ICD-10-CM | POA: Diagnosis present

## 2020-11-08 DIAGNOSIS — E875 Hyperkalemia: Secondary | ICD-10-CM | POA: Diagnosis present

## 2020-11-08 DIAGNOSIS — I214 Non-ST elevation (NSTEMI) myocardial infarction: Secondary | ICD-10-CM | POA: Diagnosis not present

## 2020-11-08 DIAGNOSIS — I5023 Acute on chronic systolic (congestive) heart failure: Secondary | ICD-10-CM | POA: Diagnosis present

## 2020-11-08 DIAGNOSIS — R0902 Hypoxemia: Secondary | ICD-10-CM | POA: Diagnosis not present

## 2020-11-08 DIAGNOSIS — J9621 Acute and chronic respiratory failure with hypoxia: Secondary | ICD-10-CM | POA: Diagnosis present

## 2020-11-08 DIAGNOSIS — L24A2 Irritant contact dermatitis due to fecal, urinary or dual incontinence: Secondary | ICD-10-CM | POA: Diagnosis not present

## 2020-11-08 DIAGNOSIS — Z96652 Presence of left artificial knee joint: Secondary | ICD-10-CM | POA: Diagnosis present

## 2020-11-08 DIAGNOSIS — Z7901 Long term (current) use of anticoagulants: Secondary | ICD-10-CM

## 2020-11-08 DIAGNOSIS — Z9111 Patient's noncompliance with dietary regimen: Secondary | ICD-10-CM

## 2020-11-08 DIAGNOSIS — I513 Intracardiac thrombosis, not elsewhere classified: Secondary | ICD-10-CM | POA: Diagnosis not present

## 2020-11-08 DIAGNOSIS — Z87891 Personal history of nicotine dependence: Secondary | ICD-10-CM

## 2020-11-08 DIAGNOSIS — M7989 Other specified soft tissue disorders: Secondary | ICD-10-CM

## 2020-11-08 DIAGNOSIS — Z8673 Personal history of transient ischemic attack (TIA), and cerebral infarction without residual deficits: Secondary | ICD-10-CM

## 2020-11-08 DIAGNOSIS — R0602 Shortness of breath: Secondary | ICD-10-CM

## 2020-11-08 DIAGNOSIS — R6520 Severe sepsis without septic shock: Secondary | ICD-10-CM | POA: Diagnosis not present

## 2020-11-08 DIAGNOSIS — Z85828 Personal history of other malignant neoplasm of skin: Secondary | ICD-10-CM

## 2020-11-08 DIAGNOSIS — I739 Peripheral vascular disease, unspecified: Secondary | ICD-10-CM | POA: Diagnosis present

## 2020-11-08 DIAGNOSIS — Z8249 Family history of ischemic heart disease and other diseases of the circulatory system: Secondary | ICD-10-CM

## 2020-11-08 DIAGNOSIS — J189 Pneumonia, unspecified organism: Secondary | ICD-10-CM | POA: Diagnosis present

## 2020-11-08 DIAGNOSIS — I5043 Acute on chronic combined systolic (congestive) and diastolic (congestive) heart failure: Secondary | ICD-10-CM | POA: Diagnosis not present

## 2020-11-08 DIAGNOSIS — E872 Acidosis: Secondary | ICD-10-CM | POA: Diagnosis present

## 2020-11-08 DIAGNOSIS — I110 Hypertensive heart disease with heart failure: Secondary | ICD-10-CM | POA: Diagnosis present

## 2020-11-08 DIAGNOSIS — S2241XD Multiple fractures of ribs, right side, subsequent encounter for fracture with routine healing: Secondary | ICD-10-CM

## 2020-11-08 DIAGNOSIS — J811 Chronic pulmonary edema: Secondary | ICD-10-CM | POA: Diagnosis not present

## 2020-11-08 DIAGNOSIS — I82409 Acute embolism and thrombosis of unspecified deep veins of unspecified lower extremity: Secondary | ICD-10-CM

## 2020-11-08 DIAGNOSIS — R0689 Other abnormalities of breathing: Secondary | ICD-10-CM | POA: Diagnosis not present

## 2020-11-08 DIAGNOSIS — A419 Sepsis, unspecified organism: Secondary | ICD-10-CM | POA: Diagnosis not present

## 2020-11-08 DIAGNOSIS — J9602 Acute respiratory failure with hypercapnia: Secondary | ICD-10-CM | POA: Diagnosis not present

## 2020-11-08 DIAGNOSIS — Z1159 Encounter for screening for other viral diseases: Secondary | ICD-10-CM | POA: Diagnosis not present

## 2020-11-08 DIAGNOSIS — I2510 Atherosclerotic heart disease of native coronary artery without angina pectoris: Secondary | ICD-10-CM | POA: Diagnosis present

## 2020-11-08 DIAGNOSIS — J9811 Atelectasis: Secondary | ICD-10-CM | POA: Diagnosis not present

## 2020-11-08 DIAGNOSIS — Z743 Need for continuous supervision: Secondary | ICD-10-CM | POA: Diagnosis not present

## 2020-11-08 DIAGNOSIS — I959 Hypotension, unspecified: Secondary | ICD-10-CM | POA: Diagnosis present

## 2020-11-08 DIAGNOSIS — I10 Essential (primary) hypertension: Secondary | ICD-10-CM | POA: Diagnosis present

## 2020-11-08 DIAGNOSIS — D649 Anemia, unspecified: Secondary | ICD-10-CM | POA: Diagnosis present

## 2020-11-08 DIAGNOSIS — R404 Transient alteration of awareness: Secondary | ICD-10-CM | POA: Diagnosis not present

## 2020-11-08 DIAGNOSIS — R778 Other specified abnormalities of plasma proteins: Secondary | ICD-10-CM | POA: Diagnosis not present

## 2020-11-08 DIAGNOSIS — W19XXXD Unspecified fall, subsequent encounter: Secondary | ICD-10-CM | POA: Diagnosis present

## 2020-11-08 DIAGNOSIS — J9600 Acute respiratory failure, unspecified whether with hypoxia or hypercapnia: Secondary | ICD-10-CM | POA: Diagnosis not present

## 2020-11-08 DIAGNOSIS — Z951 Presence of aortocoronary bypass graft: Secondary | ICD-10-CM | POA: Diagnosis not present

## 2020-11-08 DIAGNOSIS — I517 Cardiomegaly: Secondary | ICD-10-CM | POA: Diagnosis not present

## 2020-11-08 DIAGNOSIS — R1312 Dysphagia, oropharyngeal phase: Secondary | ICD-10-CM | POA: Diagnosis not present

## 2020-11-08 DIAGNOSIS — E782 Mixed hyperlipidemia: Secondary | ICD-10-CM | POA: Diagnosis present

## 2020-11-08 DIAGNOSIS — Z833 Family history of diabetes mellitus: Secondary | ICD-10-CM

## 2020-11-08 DIAGNOSIS — G9341 Metabolic encephalopathy: Secondary | ICD-10-CM | POA: Diagnosis not present

## 2020-11-08 DIAGNOSIS — Z86718 Personal history of other venous thrombosis and embolism: Secondary | ICD-10-CM | POA: Diagnosis not present

## 2020-11-08 DIAGNOSIS — U071 COVID-19: Secondary | ICD-10-CM | POA: Diagnosis not present

## 2020-11-08 DIAGNOSIS — Z66 Do not resuscitate: Secondary | ICD-10-CM | POA: Diagnosis not present

## 2020-11-08 DIAGNOSIS — M1990 Unspecified osteoarthritis, unspecified site: Secondary | ICD-10-CM | POA: Diagnosis present

## 2020-11-08 DIAGNOSIS — L98411 Non-pressure chronic ulcer of buttock limited to breakdown of skin: Secondary | ICD-10-CM | POA: Diagnosis not present

## 2020-11-08 DIAGNOSIS — I5032 Chronic diastolic (congestive) heart failure: Secondary | ICD-10-CM | POA: Diagnosis not present

## 2020-11-08 DIAGNOSIS — Y95 Nosocomial condition: Secondary | ICD-10-CM | POA: Diagnosis present

## 2020-11-08 DIAGNOSIS — J9622 Acute and chronic respiratory failure with hypercapnia: Secondary | ICD-10-CM | POA: Diagnosis not present

## 2020-11-08 DIAGNOSIS — M6281 Muscle weakness (generalized): Secondary | ICD-10-CM | POA: Diagnosis not present

## 2020-11-08 DIAGNOSIS — J9610 Chronic respiratory failure, unspecified whether with hypoxia or hypercapnia: Secondary | ICD-10-CM | POA: Diagnosis not present

## 2020-11-08 DIAGNOSIS — J9601 Acute respiratory failure with hypoxia: Secondary | ICD-10-CM | POA: Diagnosis not present

## 2020-11-08 DIAGNOSIS — R000 Tachycardia, unspecified: Secondary | ICD-10-CM | POA: Diagnosis not present

## 2020-11-08 DIAGNOSIS — Z79899 Other long term (current) drug therapy: Secondary | ICD-10-CM

## 2020-11-08 DIAGNOSIS — I472 Ventricular tachycardia: Secondary | ICD-10-CM | POA: Diagnosis not present

## 2020-11-08 DIAGNOSIS — R0603 Acute respiratory distress: Secondary | ICD-10-CM | POA: Diagnosis not present

## 2020-11-08 DIAGNOSIS — M79662 Pain in left lower leg: Secondary | ICD-10-CM

## 2020-11-08 DIAGNOSIS — I480 Paroxysmal atrial fibrillation: Secondary | ICD-10-CM | POA: Diagnosis present

## 2020-11-08 DIAGNOSIS — R531 Weakness: Secondary | ICD-10-CM | POA: Diagnosis not present

## 2020-11-08 DIAGNOSIS — I639 Cerebral infarction, unspecified: Secondary | ICD-10-CM | POA: Diagnosis not present

## 2020-11-08 DIAGNOSIS — J90 Pleural effusion, not elsewhere classified: Secondary | ICD-10-CM | POA: Diagnosis not present

## 2020-11-08 DIAGNOSIS — I4729 Other ventricular tachycardia: Secondary | ICD-10-CM

## 2020-11-08 LAB — TROPONIN I (HIGH SENSITIVITY)
Troponin I (High Sensitivity): 449 ng/L (ref <18)
Troponin I (High Sensitivity): 841 ng/L (ref <18)
Troponin I (High Sensitivity): 2805 ng/L (ref <18)
Troponin I (High Sensitivity): 3275 ng/L (ref <18)

## 2020-11-08 LAB — BLOOD GAS, VENOUS
Acid-Base Excess: 9.7 mmol/L — ABNORMAL HIGH (ref 0.0–2.0)
Bicarbonate: 38.9 mmol/L — ABNORMAL HIGH (ref 20.0–28.0)
O2 Saturation: 83.2 %
Patient temperature: 37
pCO2, Ven: 79 mmHg (ref 44.0–60.0)
pH, Ven: 7.3 (ref 7.250–7.430)
pO2, Ven: 53 mmHg — ABNORMAL HIGH (ref 32.0–45.0)

## 2020-11-08 LAB — CBC WITH DIFFERENTIAL/PLATELET
Abs Immature Granulocytes: 0.62 10*3/uL — ABNORMAL HIGH (ref 0.00–0.07)
Basophils Absolute: 0.1 10*3/uL (ref 0.0–0.1)
Basophils Relative: 1 %
Eosinophils Absolute: 0 10*3/uL (ref 0.0–0.5)
Eosinophils Relative: 0 %
HCT: 38.1 % — ABNORMAL LOW (ref 39.0–52.0)
Hemoglobin: 12 g/dL — ABNORMAL LOW (ref 13.0–17.0)
Immature Granulocytes: 4 %
Lymphocytes Relative: 4 %
Lymphs Abs: 0.6 10*3/uL — ABNORMAL LOW (ref 0.7–4.0)
MCH: 31.3 pg (ref 26.0–34.0)
MCHC: 31.5 g/dL (ref 30.0–36.0)
MCV: 99.2 fL (ref 80.0–100.0)
Monocytes Absolute: 0.7 10*3/uL (ref 0.1–1.0)
Monocytes Relative: 4 %
Neutro Abs: 15.3 10*3/uL — ABNORMAL HIGH (ref 1.7–7.7)
Neutrophils Relative %: 87 %
Platelets: 300 10*3/uL (ref 150–400)
RBC: 3.84 MIL/uL — ABNORMAL LOW (ref 4.22–5.81)
RDW: 13.2 % (ref 11.5–15.5)
WBC: 17.4 10*3/uL — ABNORMAL HIGH (ref 4.0–10.5)
nRBC: 0 % (ref 0.0–0.2)

## 2020-11-08 LAB — RESP PANEL BY RT-PCR (FLU A&B, COVID) ARPGX2
Influenza A by PCR: NEGATIVE
Influenza B by PCR: NEGATIVE
SARS Coronavirus 2 by RT PCR: NEGATIVE

## 2020-11-08 LAB — BLOOD GAS, ARTERIAL
Acid-Base Excess: 8.8 mmol/L — ABNORMAL HIGH (ref 0.0–2.0)
Allens test (pass/fail): POSITIVE — AB
Bicarbonate: 36.2 mmol/L — ABNORMAL HIGH (ref 20.0–28.0)
Delivery systems: POSITIVE
Expiratory PAP: 8
FIO2: 0.35
Inspiratory PAP: 16
O2 Saturation: 98.3 %
Patient temperature: 37
RATE: 20 resp/min
pCO2 arterial: 64 mmHg — ABNORMAL HIGH (ref 32.0–48.0)
pH, Arterial: 7.36 (ref 7.350–7.450)
pO2, Arterial: 114 mmHg — ABNORMAL HIGH (ref 83.0–108.0)

## 2020-11-08 LAB — APTT: aPTT: 35 seconds (ref 24–36)

## 2020-11-08 LAB — COMPREHENSIVE METABOLIC PANEL
ALT: 31 U/L (ref 0–44)
AST: 46 U/L — ABNORMAL HIGH (ref 15–41)
Albumin: 3.8 g/dL (ref 3.5–5.0)
Alkaline Phosphatase: 67 U/L (ref 38–126)
Anion gap: 12 (ref 5–15)
BUN: 35 mg/dL — ABNORMAL HIGH (ref 8–23)
CO2: 33 mmol/L — ABNORMAL HIGH (ref 22–32)
Calcium: 9.4 mg/dL (ref 8.9–10.3)
Chloride: 95 mmol/L — ABNORMAL LOW (ref 98–111)
Creatinine, Ser: 0.89 mg/dL (ref 0.61–1.24)
GFR, Estimated: >60 mL/min (ref >60)
Glucose, Bld: 150 mg/dL — ABNORMAL HIGH (ref 70–99)
Potassium: 5.4 mmol/L — ABNORMAL HIGH (ref 3.5–5.1)
Sodium: 140 mmol/L (ref 135–145)
Total Bilirubin: 0.6 mg/dL (ref 0.3–1.2)
Total Protein: 7.6 g/dL (ref 6.5–8.1)

## 2020-11-08 LAB — PROCALCITONIN: Procalcitonin: <0.1 ng/mL

## 2020-11-08 LAB — MAGNESIUM: Magnesium: 2.2 mg/dL (ref 1.7–2.4)

## 2020-11-08 LAB — LACTIC ACID, PLASMA
Lactic Acid, Venous: 2.1 mmol/L (ref 0.5–1.9)
Lactic Acid, Venous: 2.9 mmol/L (ref 0.5–1.9)
Lactic Acid, Venous: 2.6 mmol/L (ref 0.5–1.9)
Lactic Acid, Venous: 2.3 mmol/L (ref 0.5–1.9)

## 2020-11-08 LAB — HEPARIN LEVEL (UNFRACTIONATED): Heparin Unfractionated: 0.41 IU/mL (ref 0.30–0.70)

## 2020-11-08 LAB — MRSA NEXT GEN BY PCR, NASAL: MRSA by PCR Next Gen: NOT DETECTED

## 2020-11-08 LAB — PROTIME-INR
INR: 1.4 — ABNORMAL HIGH (ref 0.8–1.2)
Prothrombin Time: 17 seconds — ABNORMAL HIGH (ref 11.4–15.2)

## 2020-11-08 LAB — BRAIN NATRIURETIC PEPTIDE: B Natriuretic Peptide: 1087 pg/mL — ABNORMAL HIGH (ref 0.0–100.0)

## 2020-11-08 MED ORDER — SODIUM CHLORIDE 0.9 % IV SOLN: 2.0000 g | Freq: Once | INTRAVENOUS | Status: DC

## 2020-11-08 MED ORDER — IOHEXOL 350 MG/ML SOLN
75.0000 mL | Freq: Once | INTRAVENOUS | Status: AC | PRN
  Administered 2020-11-08: 75 mL via INTRAVENOUS

## 2020-11-08 MED ORDER — DEXTROSE 50 % IV SOLN
1.0000 | Freq: Once | INTRAVENOUS | Status: AC
  Administered 2020-11-08: 50 mL via INTRAVENOUS
  Filled 2020-11-08: qty 50

## 2020-11-08 MED ORDER — INSULIN ASPART 100 UNIT/ML IV SOLN
10.0000 [IU] | Freq: Once | INTRAVENOUS | Status: AC
  Administered 2020-11-08: 10 [IU] via INTRAVENOUS
  Filled 2020-11-08: qty 0.1

## 2020-11-08 MED ORDER — ACETAMINOPHEN 325 MG PO TABS: 650.0000 mg | ORAL_TABLET | Freq: Four times a day (QID) | ORAL | Status: DC | PRN

## 2020-11-08 MED ORDER — HALOPERIDOL LACTATE 5 MG/ML IJ SOLN: 2.0000 mg | Freq: Once | INTRAMUSCULAR | Status: AC

## 2020-11-08 MED ORDER — FUROSEMIDE 10 MG/ML IJ SOLN
80.0000 mg | Freq: Once | INTRAMUSCULAR | Status: AC
  Administered 2020-11-08: 80 mg via INTRAVENOUS
  Filled 2020-11-08: qty 8

## 2020-11-08 MED ORDER — ONDANSETRON HCL 4 MG PO TABS: 4.0000 mg | ORAL_TABLET | Freq: Four times a day (QID) | ORAL | Status: DC | PRN

## 2020-11-08 MED ORDER — ACETAMINOPHEN 650 MG RE SUPP: 650.0000 mg | Freq: Four times a day (QID) | RECTAL | Status: DC | PRN

## 2020-11-08 MED ORDER — HEPARIN (PORCINE) 25000 UT/250ML-% IV SOLN
1700.0000 [IU]/h | INTRAVENOUS | Status: DC
  Administered 2020-11-08 – 2020-11-09 (×2): 1100 [IU]/h via INTRAVENOUS
  Administered 2020-11-10: 1250 [IU]/h via INTRAVENOUS
  Administered 2020-11-11: 1500 [IU]/h via INTRAVENOUS
  Administered 2020-11-11: 1250 [IU]/h via INTRAVENOUS
  Administered 2020-11-13 (×2): 1700 [IU]/h via INTRAVENOUS
  Filled 2020-11-08 (×8): qty 250

## 2020-11-08 MED ORDER — SODIUM CHLORIDE 0.9 % IV SOLN
2.0000 g | Freq: Once | INTRAVENOUS | Status: AC
  Administered 2020-11-08: 2 g via INTRAVENOUS
  Filled 2020-11-08: qty 2

## 2020-11-08 MED ORDER — ONDANSETRON HCL 4 MG/2ML IJ SOLN: 4.0000 mg | Freq: Four times a day (QID) | INTRAMUSCULAR | Status: DC | PRN

## 2020-11-08 MED ORDER — HALOPERIDOL LACTATE 5 MG/ML IJ SOLN
INTRAMUSCULAR | Status: AC
  Administered 2020-11-08: 2 mg via INTRAMUSCULAR
  Filled 2020-11-08: qty 1

## 2020-11-08 MED ORDER — VANCOMYCIN HCL IN DEXTROSE 1-5 GM/200ML-% IV SOLN
1000.0000 mg | Freq: Once | INTRAVENOUS | Status: AC
  Administered 2020-11-08: 1000 mg via INTRAVENOUS
  Filled 2020-11-08: qty 200

## 2020-11-08 MED ORDER — HEPARIN BOLUS VIA INFUSION
4000.0000 [IU] | Freq: Once | INTRAVENOUS | Status: AC
  Administered 2020-11-08: 4000 [IU] via INTRAVENOUS
  Filled 2020-11-08: qty 4000

## 2020-11-08 MED ORDER — FUROSEMIDE 10 MG/ML IJ SOLN
40.0000 mg | Freq: Every day | INTRAMUSCULAR | Status: DC
  Administered 2020-11-09 – 2020-11-10 (×2): 40 mg via INTRAVENOUS
  Filled 2020-11-08 (×2): qty 4

## 2020-11-08 MED ORDER — SODIUM CHLORIDE 0.9 % IV SOLN
2.0000 g | Freq: Three times a day (TID) | INTRAVENOUS | Status: DC
  Administered 2020-11-08 – 2020-11-09 (×3): 2 g via INTRAVENOUS
  Filled 2020-11-08 (×6): qty 2

## 2020-11-08 MED ORDER — VANCOMYCIN HCL 1500 MG/300ML IV SOLN
1500.0000 mg | INTRAVENOUS | Status: DC
  Filled 2020-11-08: qty 300

## 2020-11-08 MED ORDER — ASPIRIN 300 MG RE SUPP
300.0000 mg | Freq: Once | RECTAL | Status: DC
  Filled 2020-11-08: qty 1

## 2020-11-08 MED ORDER — IPRATROPIUM-ALBUTEROL 0.5-2.5 (3) MG/3ML IN SOLN
3.0000 mL | RESPIRATORY_TRACT | Status: DC | PRN
  Administered 2020-11-10: 3 mL via RESPIRATORY_TRACT
  Filled 2020-11-08: qty 3

## 2020-11-08 MED ORDER — LACTATED RINGERS IV SOLN: INTRAVENOUS | Status: AC

## 2020-11-08 MED ORDER — ALBUTEROL SULFATE (2.5 MG/3ML) 0.083% IN NEBU
5.0000 mg | INHALATION_SOLUTION | Freq: Once | RESPIRATORY_TRACT | Status: AC
  Administered 2020-11-08: 5 mg via RESPIRATORY_TRACT
  Filled 2020-11-08: qty 6

## 2020-11-08 NOTE — ED Notes (Addendum)
MD made aware of critical troponin 449

## 2020-11-08 NOTE — ED Notes (Signed)
Lab at the bedside 

## 2020-11-08 NOTE — ED Notes (Signed)
RT called to collect ABG and transporting pt to CT

## 2020-11-08 NOTE — Consult Note (Signed)
Pharmacy Antibiotic Note  Benjamin Cordova is a 86 y.o. male admitted on 11/08/2020 with  HCAP .    Pharmacy has been consulted for Vancomycin/Cefepime dosing.  Plan: Pt received Cefepime 2g IV x 1 in the ED,  - Will follow with Cefepime 2g q8h  Pt received Vancomcyin 1g in ED - will follow with another 1g dose to complete a 2g load, then start  Vancomycin 1500 mg IV Q 24 hrs. Goal AUC 400-550. Expected AUC: 517 SCr used: 0.89  Will order MRSA PCR for possible de-escalation of vancomcyin   Will follow renal function for dosing adjustment  Temp (24hrs), Avg:97.6 F (36.4 C), Min:97.6 F (36.4 C), Max:97.6 F (36.4 C)  Recent Labs  Lab 11/08/20 0935 11/08/20 1120  WBC 17.4*  --   CREATININE 0.89  --   LATICACIDVEN 2.1* 2.9*    Estimated Creatinine Clearance: 74.1 mL/min (by C-G formula based on SCr of 0.89 mg/dL).    No Known Allergies  Antimicrobials this admission: Cefepime 8/9 >> Vancomcyin 8/9 >>  Dose adjustments this admission: none  Microbiology results: 8/9 BCx: pending 8/9 UCx: pending   8/9 MRSA PCR: pending  Thank you for allowing pharmacy to be a part of this patient's care.  Lu Duffel, PharmD, BCPS Clinical Pharmacist 11/08/2020 2:45 PM

## 2020-11-08 NOTE — ED Notes (Signed)
Pt O2 sat down to 78.  Applied face mask at 8L. Sat up to 93. MD requested Bipap.

## 2020-11-08 NOTE — Therapy (Signed)
Pt transported from ED to CT by Terrial Rhodes, RCP along with ED RN.  CT was performed and Annia Belt, RCP assisted RN with returning patient to room #5 in the ED.  No adverse events.  Patient remained on current BIPAP settings.  BIPAP was connected back to the wall once in #5.

## 2020-11-08 NOTE — Consult Note (Signed)
PHARMACY -  BRIEF ANTIBIOTIC NOTE   Pharmacy has received consult(s) for cefepime and vancomycin from an ED provider.  The patient's profile has been reviewed for ht/wt/allergies/indication/available labs.    One time order(s) placed for --Cefepime 2 g IV  --Vancomycin 1 g IV  Further antibiotics/pharmacy consults should be ordered by admitting physician if indicated.                       Thank you, Benita Gutter 11/08/2020  9:58 AM

## 2020-11-08 NOTE — Consult Note (Signed)
ANTICOAGULATION CONSULT NOTE  Pharmacy Consult for IV heparin Indication: chest pain/ACS  Patient Measurements: Heparin Dosing Weight: 91 kg  Labs: Recent Labs    11/08/20 0935 11/08/20 1120 11/08/20 1533 11/08/20 1813 11/08/20 2136  HGB 12.0*  --   --   --   --   HCT 38.1*  --   --   --   --   PLT 300  --   --   --   --   APTT 35  --   --   --   --   LABPROT 17.0*  --   --   --   --   INR 1.4*  --   --   --   --   HEPARINUNFRC  --   --   --   --  0.41  CREATININE 0.89  --   --   --   --   TROPONINIHS 449* 841* 2,805* 3,275*  --      Estimated Creatinine Clearance: 74.1 mL/min (by C-G formula based on SCr of 0.89 mg/dL).   Medical History: Past Medical History:  Diagnosis Date   Arthritis    Coronary artery disease    a. s/p CABG in 1981 b. 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 Troponin elevated to 449. Pharmacy has been consulted to initiate heparin infusion for suspected ACS.  Baseline H&H, platelets acceptable. Baseline INR 1.4 (on warfarin as outpatient), aPTT 35.   Goal of Therapy:  Heparin level 0.3-0.7 units/ml Monitor  platelets by anticoagulation protocol: Yes  0809 2136 HL 0.41, therapeutic x 1   Plan:  --Continue heparin infusion at 1100 units/hr --Will recheck heparin level with AM labs to confirm, then daily. --Daily CBC per protocol while on IV heparin  Renda Rolls, PharmD, [facility] 11/08/2020 11:13 PM

## 2020-11-08 NOTE — ED Provider Notes (Addendum)
[facility] Emergency Department Provider Note  ____________________________________________   Event Date/Time   First MD Initiated Contact with Patient 11/08/20 [phone]     (approximate)  I have reviewed the triage vital signs and the nursing notes.   HISTORY  Chief Complaint Respiratory Distress   HPI Benjamin Cordova is a 86 y.o. male  with history of coronary artery disease status post CABG, hypertension, stroke, ischemic cardiomyopathy, peripheral vascular disease and recent admission for acute heart failure exacerbation who presents via EMS from [place] for assessment of acute altered mental status and hypoxia.  Patient is reportedly alert and oriented and able to carry normal conversation after taking his morning medicines this morning on reassessment by nursing facility staff he was noted to be unresponsive and hypoxic.  Per EMS his SPO2 was in the 60s on room air.  He was placed on nonrebreather with improvement to the 90s and was transported.  He has not been able to present history to EMS.  He had reportedly a normal blood sugar with EMS.  No seizure history.  On arrival patient is altered and unable provide any significant past medical history.         Past Medical History:  Diagnosis Date   Arthritis    Coronary artery disease    a. s/p CABG in 1981 b. cath in 10/2014 showing occluded grafts --> redo CABG in 2016 LIMA-LAD and Left Radial-OM2   Essential hypertension    Hematuria    History of blood transfusion 07/2015   Knee replacement HgB <9   History of pneumonia    History of stroke    Ischemic cardiomyopathy    Left ventricular apical thrombus    Mixed hyperlipidemia    Myocardial infarction [facility]) 1980   Peripheral vascular disease ([place])    Polio osteopathy of lower leg (HCC) Age 73   Affected right leg   Skin cancer    TIA (transient ischemic attack) 1989   Chronic coumadin   UTI (lower urinary tract infection)     Patient  Active Problem List   Diagnosis Date Noted   Acute respiratory failure with hypoxia ([place]) 10/27/2020   Acute on chronic systolic CHF (congestive heart failure) ([place]) 10/26/2020   Elevated troponin 10/24/2020   CHF exacerbation ([place]) 10/23/2020   Hypotension 10/06/2020   Multiple rib fractures 10/05/2020   Uncontrolled pain 10/05/2020   AKI (acute kidney injury) ([place])    Weakness 06/30/2019   Dyspnea 06/30/2019   Subtherapeutic anticoagulation    Acute on chronic respiratory failure with hypoxia and hypercapnia (HCC)    Acute on chronic systolic HF (heart failure) (HCC)    LV (left ventricular) mural thrombus [id number]   Systolic and diastolic CHF, acute on chronic (HCC)    Hypokalemia    CHF (congestive heart failure) ([place]) 09/01/2015   Respiratory failure with hypoxia ([place]) 08/31/2015   HCAP (healthcare-associated pneumonia) 08/31/2015   Acute respiratory failure with hypoxia and hypercapnia ([place])    Encounter for central line placement    Knee swelling    Subtherapeutic international normalized ratio (INR) 08/08/2015   Constipation 08/08/2015   Hyperlipidemia 08/08/2015   Blurred vision 07/29/2015   Anemia 07/29/2015   S/P total knee replacement 07/27/2015   S/P CABG x 2 10/08/2014   Chest pain 10/01/2014   Elevated INR 10/01/2014   Leukocytosis 10/01/2014   Thrombocytopenia ([place]) 10/01/2014   Chest pain at rest 10/01/2014   Unstable angina ([place]) 10/01/2014   Coagulopathy ([place])  10/01/2014   Paroxysmal atrial fibrillation ([place]) 10/01/2014   Coronary artery disease    Hypertension    Arthritis    Peripheral vascular disease ([place])    Essential hypertension     Past Surgical History:  Procedure Laterality Date   CARDIAC CATHETERIZATION N/A 10/05/2014   Procedure: Left Heart Cath and Coronary Angiography;  Surgeon: Peter M Martinique, MD;  Location: [place] [facility];  Service: Cardiovascular;  Laterality: N/A;   CORONARY ARTERY BYPASS GRAFT  1981   [facility]    CORONARY ARTERY BYPASS GRAFT N/A 10/08/2014   Procedure: REDO CORONARY ARTERY BYPASS GRAFTING (CABG) Times Two Grafts using Left Internal Mammary and Left Radial Artery;  Surgeon: Melrose Nakayama, MD;  Location: [place];  Service: Open Heart Surgery;  Laterality: N/A;   CYSTOSCOPY WITH INJECTION  10/10/2010   Procedure: CYSTOSCOPY WITH INJECTION;  Surgeon: Marissa Nestle;  Location: [facility];  Service: Urology;  Laterality: N/A;  with retrograde urethrogram   FOOT SURGERY Right 1954   [facility], muscle implantation   JOINT REPLACEMENT     RADIAL ARTERY HARVEST Left 10/08/2014   Procedure: Left RADIAL ARTERY HARVEST;  Surgeon: Melrose Nakayama, MD;  Location: [place];  Service: Open Heart Surgery;  Laterality: Left;   TEE WITHOUT CARDIOVERSION N/A 10/08/2014   Procedure: TRANSESOPHAGEAL ECHOCARDIOGRAM (TEE);  Surgeon: Melrose Nakayama, MD;  Location: [place];  Service: Open Heart Surgery;  Laterality: N/A;   TONSILLECTOMY  1949   [facility]   TOTAL KNEE ARTHROPLASTY Left 07/27/2015   Procedure: TOTAL LEFT KNEE ARTHROPLASTY;  Surgeon: [name], MD;  Location: [place];  Service: Orthopedics;  Laterality: Left;    Prior to Admission medications   Medication Sig Start Date End Date Taking? Authorizing Provider  acetaminophen (TYLENOL) 325 MG tablet Take 2 tablets (650 mg total) by mouth every 6 (six) hours. 10/11/20   Johnson, Clanford L, MD  albuterol (PROVENTIL) (2.5 MG/3ML) 0.083% nebulizer solution Take 2.5 mg by nebulization every 6 (six) hours as needed for wheezing or shortness of breath.  08/29/19   [provider]  ascorbic acid (VITAMIN C) 500 MG tablet Take 1,000 mg by mouth 2 (two) times daily.    [provider]  diphenhydrAMINE (BENADRYL) 50 MG capsule Take 1 capsule (50 mg total) by mouth at bedtime as needed for sleep. 10/11/20   Johnson, Clanford L, MD  finasteride (PROSCAR) 5 MG tablet Take 5 mg by mouth daily. 04/20/19   [provider]  folic acid (FOLVITE) [id number]  MCG tablet Take 800 mcg by mouth daily.    [provider]  furosemide (LASIX) 40 MG tablet Take 1 tablet (40 mg total) by mouth daily. 10/11/20   Johnson, Clanford L, MD  ipratropium-albuterol (DUONEB) 0.5-2.5 (3) MG/3ML SOLN Take 3 mLs by nebulization every 4 (four) hours as needed (SOB/wheezing).    [provider]  metoprolol succinate (TOPROL-XL) 50 MG 24 hr tablet Take 1 tablet (50 mg total) by mouth daily. Take with or immediately following a meal. 10/29/20   Tat, Shanon Brow, MD  Multiple Vitamins-Minerals (CERTAVITE/ANTIOXIDANTS) TABS Take 1 tablet by mouth daily.     [provider]  potassium chloride (K-DUR) 10 MEQ tablet Take 10 mEq by mouth daily. Take with Lasix (Furosemide) 03/18/18   [provider]  senna-docusate (SENOKOT-S) 8.6-50 MG tablet Take 1 tablet by mouth at bedtime. 10/11/20   Johnson, Clanford L, MD  simvastatin (ZOCOR) 40 MG tablet Take 40 mg by mouth at  bedtime.     [provider]  spironolactone (ALDACTONE) 25 MG tablet Take 0.5 tablets (12.5 mg total) by mouth daily. 11/10/19 10/26/20  Strader, Fransisco Hertz, PA-C  tamsulosin (FLOMAX) 0.4 MG CAPS capsule Take 0.4 mg by mouth in the morning.  12/02/18   [provider]  warfarin (COUMADIN) 5 MG tablet Take 1.5 tablets (7.5 mg total) by mouth daily at 6 PM. 10/28/20   Orson Eva, MD    Allergies Patient has no known allergies.  Family History  Problem Relation Age of Onset   Heart attack Mother    Diabetes Mother    Heart attack Father    Heart attack Brother    Anesthesia problems Neg Hx    Hypotension Neg Hx    Malignant hyperthermia Neg Hx    Pseudochol deficiency Neg Hx     Social History Social History   Tobacco Use   Smoking status: Former    Packs/day: 1.00    Years: 28.00    Pack years: 28.00    Types: Cigarettes    Quit date: 09/22/1978    Years since quitting: 42.1   Smokeless tobacco: Never  Vaping Use   Vaping Use: Never used  Substance Use  Topics   Alcohol use: No    Alcohol/week: 0.0 standard drinks   Drug use: No    Review of Systems  Review of Systems  Unable to perform ROS: Mental status change     ____________________________________________   PHYSICAL EXAM:  VITAL SIGNS: ED Triage Vitals  Enc Vitals Group     BP      Pulse      Resp      Temp      Temp src      SpO2      Weight      Height      Head Circumference      Peak Flow      Pain Score      Pain Loc      Pain Edu?      Excl. in [place]?    Vitals:   11/08/20 1030 11/08/20 1100  BP: 138/84 102/60  Pulse: (!) 122 (!) 110  Resp: (!) 23 (!) 28  Temp:    SpO2: 95% 96%   Physical Exam Vitals and nursing note reviewed.  Constitutional:      General: He is in acute distress.     Appearance: He is well-developed. He is ill-appearing.  HENT:     Head: Normocephalic and atraumatic.     Right Ear: External ear normal.     Left Ear: External ear normal.  Eyes:     Conjunctiva/sclera: Conjunctivae normal.  Cardiovascular:     Rate and Rhythm: Regular rhythm. Tachycardia present.     Heart sounds: No murmur heard. Pulmonary:     Effort: Tachypnea and respiratory distress present.     Breath sounds: Decreased breath sounds and rhonchi present.  Abdominal:     Palpations: Abdomen is soft.     Tenderness: There is no abdominal tenderness.  Musculoskeletal:     Cervical back: Neck supple.     Right lower leg: Edema present.     Left lower leg: Edema present.  Skin:    General: Skin is warm and dry.  Neurological:     Mental Status: He is alert.     GCS: GCS eye subscore is 4. GCS verbal subscore is 2.     Comments: Patient is able  to give this examiner thumbs up in both hands and able to wiggle  toes on the right foot.  Does not follow any other commands and that seems to be fairly intermittent is a few minutes later patient is not following commands.  Pupils are symmetric and reactive to light bilaterally.  However he does not track this  examiner.    ____________________________________________   LABS (all labs ordered are listed, but only abnormal results are displayed)  Labs Reviewed  LACTIC ACID, PLASMA - Abnormal; Notable for the following components:      Result Value   Lactic Acid, Venous 2.1 (*)    All other components within normal limits  COMPREHENSIVE METABOLIC PANEL - Abnormal; Notable for the following components:   Potassium 5.4 (*)    Chloride 95 (*)    CO2 33 (*)    Glucose, Bld 150 (*)    BUN 35 (*)    AST 46 (*)    All other components within normal limits  CBC WITH DIFFERENTIAL/PLATELET - Abnormal; Notable for the following components:   WBC 17.4 (*)    RBC 3.84 (*)    Hemoglobin 12.0 (*)    HCT 38.1 (*)    Neutro Abs 15.3 (*)    Lymphs Abs 0.6 (*)    Abs Immature Granulocytes 0.62 (*)    All other components within normal limits  PROTIME-INR - Abnormal; Notable for the following components:   Prothrombin Time 17.0 (*)    INR 1.4 (*)    All other components within normal limits  BLOOD GAS, VENOUS - Abnormal; Notable for the following components:   pCO2, Ven 79 (*)    pO2, Ven 53.0 (*)    Bicarbonate 38.9 (*)    Acid-Base Excess 9.7 (*)    All other components within normal limits  BRAIN NATRIURETIC PEPTIDE - Abnormal; Notable for the following components:   B Natriuretic Peptide 1,087.0 (*)    All other components within normal limits  TROPONIN I (HIGH SENSITIVITY) - Abnormal; Notable for the following components:   Troponin I (High Sensitivity) 449 (*)    All other components within normal limits  CULTURE, BLOOD (SINGLE)  RESP PANEL BY RT-PCR (FLU A&B, COVID) [id number]  URINE CULTURE  APTT  MAGNESIUM  LACTIC ACID, PLASMA  PROCALCITONIN  HEPARIN LEVEL (UNFRACTIONATED)  TROPONIN I (HIGH SENSITIVITY)   ____________________________________________  EKG  Sinus tachycardia with ventricular to 120, normal axis, unremarkable intervals, ventricular premature complexes and multiple  nonspecific changes throughout. ____________________________________________  RADIOLOGY  ED MD interpretation: T head shows redemonstrated chronic cortical subcortical infarcts in the right parietal and temporal lobes.  No evidence of acute intracranial hemorrhage or other clear acute or subacute process.  Official radiology report(s): CT HEAD WO CONTRAST (5MM)  Result Date: 11/08/2020 CLINICAL DATA:  Mental status change, unknown cause. EXAM: CT HEAD WITHOUT CONTRAST TECHNIQUE: Contiguous axial images were obtained from the base of the skull through the vertex without intravenous contrast. COMPARISON:  Prior head CT examinations 02/03/2019 and earlier. FINDINGS: Brain: Redemonstrated chronic cortical/subcortical infarct within the right frontal, parietal and temporal lobes. Associated ex vacuo dilatation of the right lateral ventricle. Background cerebral volume is normal for age. There is no acute intracranial hemorrhage. No acute demarcated cortical infarct. No extra-axial fluid collection. No evidence of an intracranial mass. No midline shift. Vascular: No hyperdense vessel.  Atherosclerotic calcifications. Skull: Normal. Negative for fracture or focal lesion. Sinuses/Orbits: Visualized orbits show no acute finding. Left maxillary sinus mucous retention cysts  measuring up to 13 mm. Trace mucosal thickening within the right maxillary sinus. Trace bilateral ethmoid sinus mucosal thickening. Redemonstrated small foci of polypoid soft tissue within the right nasal passage. IMPRESSION: No evidence of acute intracranial abnormality. Redemonstrated chronic cortical/subcortical infarct within the right frontal, parietal and temporal lobes. Paranasal sinus disease, as described. Electronically Signed   By: Kellie Simmering DO   On: 11/08/2020 10:28   DG Chest Port 1 View  Result Date: 11/08/2020 CLINICAL DATA:  Questionable sepsis - evaluate for abnormality. EXAM: PORTABLE CHEST 1 VIEW COMPARISON:  Prior chest  radiographs 10/25/2020 and earlier. CT angiogram chest 09/15/2019 FINDINGS: Prior median sternotomy. Unchanged cardiomegaly. Small to moderate bilateral pleural effusions. Redemonstrated scarring within the left mid lung. Additional patchy and ill-defined opacities within the bilateral mid to lower lung fields may reflect atelectasis, edema or pneumonia. No evidence of pneumothorax. No acute bony abnormality identified. IMPRESSION: Small-to-moderate bilateral pleural effusions. Additional patchy and ill-defined opacities within the bilateral mid-to-lower lung fields, which may reflect atelectasis, edema or pneumonia. Redemonstrated scarring within the left mid lung. Unchanged cardiomegaly. Electronically Signed   By: Kellie Simmering DO   On: 11/08/2020 10:32    ____________________________________________   PROCEDURES  Procedure(s) performed (including Critical Care):  .Critical Care  Date/Time: 11/08/2020 11:35 AM Performed by: Lucrezia Starch, MD Authorized by: Lucrezia Starch, MD   Critical care provider statement:    Critical care time (minutes):  45   Critical care was necessary to treat or prevent imminent or life-threatening deterioration of the following conditions:  Sepsis, respiratory failure and cardiac failure   Critical care was time spent personally by me on the following activities:  Discussions with consultants, evaluation of patient's response to treatment, examination of patient, ordering and performing treatments and interventions, ordering and review of laboratory studies, ordering and review of radiographic studies, pulse oximetry, re-evaluation of patient's condition, obtaining history from patient or surrogate and review of old charts   ____________________________________________   INITIAL IMPRESSION / [place] / ED COURSE      Patient presents with above to history and exam for assessment of acute altered mental status and hypoxia.  On arrival patient is  hypoxic on 2 L was able to mean SPO2 on 5 L nasal cannula and mid 90s.  He is tachycardic and tachypneic but afebrile with a stable blood pressure.  He has diffuse bilateral rhonchi and decreased breath sounds.  He is minimally interactive on exam.  He has some mild lower extremity medical Foley in place.  No obvious injuries noted.  Primary differential includes PE, pneumonia as well as aspiration, acute intracranial hemorrhage, CVA, seizure, metabolic derangements.  CBC remarkable for leukocytosis with WBC count of 17.4 without evidence of acute anemia.  VBG with a pH of 7.3 and a PCO2 of 79 with a bicarb of 38.9.  lAcid slightly elevated 2.1.  CMP remarkable for no significant electrolyte or metabolic derangements.  INR is subtherapeutic at 1.4.  BNP is quite elevated at 1087.   Chest x-ray has findings concerning for possible edema but difficult to exclude multifocal pneumonia  Given respiratory distress, tachycardia, tachypnea and leukocytosis with elevated lactic acid and concern for possible pneumonia patient treated for possible sepsis with broad-spectrum antibiotics.  Blood cultures drawn.  IV fluids deferred as patient appears euvolemic and is extremely elevated BNP with concerns for some edema on his chest x-ray.  In addition his troponin returned elevated at 449 and given nonspecific findings on EKG and  concern for NSTEMI.  Unclear if this is related to myocarditis from acute infection or initial precipitating event causing fluid backup in his lungs.  CTA ordered to rule out PE as patient is subtherapeutic on his Coumadin.  I will plan admit to medicine service for further evaluation and management     ____________________________________________   FINAL CLINICAL IMPRESSION(S) / ED DIAGNOSES  Final diagnoses:  NSTEMI (non-ST elevated myocardial infarction) ([place])  Sepsis, due to unspecified organism, unspecified whether acute organ dysfunction present ([place])  Altered mental status,  unspecified altered mental status type  Acute on chronic respiratory failure with hypoxia and hypercapnia (HCC)    Medications  vancomycin (VANCOCIN) IVPB 1000 mg/200 mL premix (has no administration in time range)  heparin bolus via infusion 4,000 Units (has no administration in time range)  heparin ADULT infusion 100 units/mL (25000 units/267m) (has no administration in time range)  ceFEPIme (MAXIPIME) 2 g in sodium chloride 0.9 % 100 mL IVPB (0 g Intravenous Stopped 11/08/20 1117)  furosemide (LASIX) injection 80 mg (80 mg Intravenous Given 11/08/20 1039)  haloperidol lactate (HALDOL) injection 2 mg (2 mg Intramuscular Given 11/08/20 1046)     ED Discharge Orders     None        Note:  This document was prepared using Dragon voice recognition software and may include unintentional dictation errors.    SLucrezia Starch MD 11/08/20 1135    SLucrezia Starch MD 11/08/20 1136

## 2020-11-08 NOTE — Consult Note (Signed)
ANTICOAGULATION CONSULT NOTE  Pharmacy Consult for IV heparin Indication: chest pain/ACS  Patient Measurements: Heparin Dosing Weight: 91 kg  Labs: Recent Labs    11/08/20 0935  HGB 12.0*  HCT 38.1*  PLT 300  APTT 35  LABPROT 17.0*  INR 1.4*  CREATININE 0.89  TROPONINIHS 449*    Estimated Creatinine Clearance: 74.1 mL/min (by C-G formula based on SCr of 0.89 mg/dL).   Medical History: Past Medical History:  Diagnosis Date   Arthritis    Coronary artery disease    a. s/p CABG in 1981 b. cath in 10/2014 showing occluded grafts --> redo CABG in 2016 LIMA-LAD and Left Radial-OM2   Essential hypertension    Hematuria    History of blood transfusion 07/2015   Knee replacement HgB <9   History of pneumonia    History of stroke    Ischemic cardiomyopathy    Left ventricular apical thrombus    Mixed hyperlipidemia    Myocardial infarction [facility]) 1980   Peripheral vascular disease (HCC)    Polio osteopathy of lower leg (HCC) Age 38   Affected right leg   Skin cancer    TIA (transient ischemic attack) 1989   Chronic coumadin   UTI (lower urinary tract infection)     Medications:  Warfarin 7.5 mg daily prior to admission (medication reconciliation is pending)  Assessment: Patient is an 86 y/o M with medical history as above and including CAD s/p CABG, PVD, LV thrombus, TIA / CVA who was BIBEMS from [facility] for unresponsiveness and respiratory failure. Troponin elevated to 449. Pharmacy has been consulted to initiate heparin infusion for suspected ACS.  Baseline H&H, platelets acceptable. Baseline INR 1.4 (on warfarin as outpatient), aPTT 35.   Goal of Therapy:  Heparin level 0.3-0.7 units/ml Monitor platelets by anticoagulation protocol: Yes   Plan:  --Heparin 4000 unit IV bolus followed by continuous infusion at 1100 units/hr --Will check heparin level 8 hours after initiation of infusion --Daily CBC per protocol while on IV heparin  Benita Gutter 11/08/2020,11:06 AM

## 2020-11-08 NOTE — ED Notes (Signed)
Family at bedside. 

## 2020-11-08 NOTE — Consult Note (Signed)
Cardiology Consultation:   Patient ID: Benjamin Cordova MRN: [id number]; DOB: [birth year]  Admit date: 11/08/2020 Date of Consult: 11/08/2020  PCP:  Pablo Lawrence, NP   [facility] Providers Cardiologist:  Rozann Lesches, MD   {  Patient Profile:   Benjamin Cordova is a 86 y.o. male with a hx of CAD s/p CABG 1981, cath 10/2014 showing occluded grafts and redo CABG in 2016 LIMA-LAD and Left radial-OM2), chronic combined systolic and diastolic CHF (EF 123456 in 2016, 40-50% in 03/2018, 25-30% in 06/2019 and LVEF <20% by echo 10/07/20), ICM, history of LV apical thrombus (on echo 06/2019) on coumadin, HTN, HLD and h/o CVA who is being seen 11/08/2020 for the evaluation of heart failure at the request of Dr. Francine Graven.  History of Present Illness:   History obtained through chart review given altered mental status of patient. Some history provided by the son  Mr. Ice is followed by Dr. Domenic Polite for the above complicated cardiac history. Pt with multiple readmissions.  Echo March 2021 showed LVEF 25-30% with laminated mural LV apical thrombus and is on coumadin, this is followed by Dr. Nevada Crane  Pt saw Dr. Domenic Polite 09/28/19 and echo was reviewed, showing depressed EF from 40-45% in 2019 to 25-30% in 06/2019. Ischemic evaluation was discussed but patient did not want to pursue cardiac cath. Also was not interested in ICD. He was not interested in making medication changes.   Pt saw Dr. Domenic Polite 04/30/20 and reported he was walking on the treadmill. HE again declined further ischemic work-up and ICD. All medications were continued.   Pt was last seen by cardiology during admission 7/6-7/12 for right shoulder pain and fall. Cardiology saw patient for hypotension.echo showed depressed EF <20%. Coreg was switched to Toprol. He was continued on lasix '40mg'$  and spironolactone 12.'5mg'$  daily. Delene Loll was discontinued.Troponin was mildly elevated, felt to be demand ischemia.  He was discharged to SNF, [facility]  facility.   Admission 7/24- 7/29 for hypoxia and AMS. Apparently patient was being treated for UTI And PNA and was on his way to [facility]. He reported diet noncompliance. He was treated to acute respiratory failure 2/2 diet noncompliance treated with IV diuresis. Discharged on lasix '40mg'$  daily, metoprolol and spironolactone.  He presented to the ER 11/08/20 for shortness of breath. Took morning medications and on reassessment noted to be unresponsive and hypoxic. EMS was called who reported O2 sats in the 60s. He was placed on NRB with improvement to the 90s and transported to the ER.   In the ER BP 138/84, Pulse 22, RR 23, )2 95%. Lactic acid 2.1, potassium 5.4, CO2 33, AST46, Cr, 0.83, BUN 31, WBC 17.4, Hgb 12.0. BNP 1,087. INR 1.4. HS trop 449>841.EKG with ST,115bpm, poor  r wave progression, q waves anterior leads.. CTH showed no acute abnormality. CXR with atelectasis, edema or PNA. CTA chest showed no PE, moderate right and small left pleural effusions. COVID negative. He was started on IV heparin. Given IV lasix and IV abx and admitted. Blood gas showed respiratory acidosis placed no Bipap.   Son reported he had lunch with patient yesterday and reported patient has chronic shortness of breath since admission in July. Does not wear O2 at home. Uses cane/walker at home.   Past Medical History:  Diagnosis Date   Arthritis    Coronary artery disease    a. s/p CABG in 1981 b. cath in 10/2014 showing occluded grafts --> redo CABG in 2016 LIMA-LAD and Left Radial-OM2  Essential hypertension    Hematuria    History of blood transfusion 07/2015   Knee replacement HgB <9   History of pneumonia    History of stroke    Ischemic cardiomyopathy    Left ventricular apical thrombus    Mixed hyperlipidemia    Myocardial infarction [facility]) 1980   Peripheral vascular disease ([place])    Polio osteopathy of lower leg (HCC) Age 47   Affected right leg   Skin cancer    TIA (transient ischemic attack)  1989   Chronic coumadin   UTI (lower urinary tract infection)     Past Surgical History:  Procedure Laterality Date   CARDIAC CATHETERIZATION N/A 10/05/2014   Procedure: Left Heart Cath and Coronary Angiography;  Surgeon: Peter M Martinique, MD;  Location: [facility];  Service: Cardiovascular;  Laterality: N/A;   CORONARY ARTERY BYPASS GRAFT  1981   [facility]   CORONARY ARTERY BYPASS GRAFT N/A 10/08/2014   Procedure: REDO CORONARY ARTERY BYPASS GRAFTING (CABG) Times Two Grafts using Left Internal Mammary and Left Radial Artery;  Surgeon: Melrose Nakayama, MD;  Location: [place];  Service: Open Heart Surgery;  Laterality: N/A;   CYSTOSCOPY WITH INJECTION  10/10/2010   Procedure: CYSTOSCOPY WITH INJECTION;  Surgeon: Marissa Nestle;  Location: [facility];  Service: Urology;  Laterality: N/A;  with retrograde urethrogram   FOOT SURGERY Right 1954   [facility], muscle implantation   JOINT REPLACEMENT     RADIAL ARTERY HARVEST Left 10/08/2014   Procedure: Left RADIAL ARTERY HARVEST;  Surgeon: Melrose Nakayama, MD;  Location: [place];  Service: Open Heart Surgery;  Laterality: Left;   TEE WITHOUT CARDIOVERSION N/A 10/08/2014   Procedure: TRANSESOPHAGEAL ECHOCARDIOGRAM (TEE);  Surgeon: Melrose Nakayama, MD;  Location: [place];  Service: Open Heart Surgery;  Laterality: N/A;   TONSILLECTOMY  1949   [facility]   TOTAL KNEE ARTHROPLASTY Left 07/27/2015   Procedure: TOTAL LEFT KNEE ARTHROPLASTY;  Surgeon: [name], MD;  Location: [place];  Service: Orthopedics;  Laterality: Left;     Home Medications:  Prior to Admission medications   Medication Sig Start Date End Date Taking? Authorizing Provider  acetaminophen (TYLENOL) 325 MG tablet Take 2 tablets (650 mg total) by mouth every 6 (six) hours. 10/11/20   Johnson, Clanford L, MD  albuterol (PROVENTIL) (2.5 MG/3ML) 0.083% nebulizer solution Take 2.5 mg by nebulization every 6 (six) hours as needed for wheezing or shortness of breath.  08/29/19    [provider]  ascorbic acid (VITAMIN C) 500 MG tablet Take 1,000 mg by mouth 2 (two) times daily.    [provider]  diphenhydrAMINE (BENADRYL) 50 MG capsule Take 1 capsule (50 mg total) by mouth at bedtime as needed for sleep. 10/11/20   Johnson, Clanford L, MD  finasteride (PROSCAR) 5 MG tablet Take 5 mg by mouth daily. 04/20/19   [provider]  folic acid (FOLVITE) Q000111Q MCG tablet Take 800 mcg by mouth daily.    [provider]  furosemide (LASIX) 40 MG tablet Take 1 tablet (40 mg total) by mouth daily. 10/11/20   Johnson, Clanford L, MD  ipratropium-albuterol (DUONEB) 0.5-2.5 (3) MG/3ML SOLN Take 3 mLs by nebulization every 4 (four) hours as needed (SOB/wheezing).    [provider]  metoprolol succinate (TOPROL-XL) 50 MG 24 hr tablet Take 1 tablet (50 mg total) by mouth daily. Take with or immediately following a meal. 10/29/20   Orson Eva, MD  Multiple  Vitamins-Minerals (CERTAVITE/ANTIOXIDANTS) TABS Take 1 tablet by mouth daily.     [provider]  potassium chloride (K-DUR) 10 MEQ tablet Take 10 mEq by mouth daily. Take with Lasix (Furosemide) 03/18/18   [provider]  senna-docusate (SENOKOT-S) 8.6-50 MG tablet Take 1 tablet by mouth at bedtime. 10/11/20   Johnson, Clanford L, MD  simvastatin (ZOCOR) 40 MG tablet Take 40 mg by mouth at bedtime.     [provider]  spironolactone (ALDACTONE) 25 MG tablet Take 0.5 tablets (12.5 mg total) by mouth daily. 11/10/19 10/26/20  Strader, Fransisco Hertz, PA-C  tamsulosin (FLOMAX) 0.4 MG CAPS capsule Take 0.4 mg by mouth in the morning.  12/02/18   [provider]  warfarin (COUMADIN) 5 MG tablet Take 1.5 tablets (7.5 mg total) by mouth daily at 6 PM. 10/28/20   Tat, Shanon Brow, MD    Inpatient Medications: Scheduled Meds:  albuterol  5 mg Nebulization Once   dextrose  1 ampule Intravenous Once   insulin aspart  10 Units Intravenous Once   Continuous Infusions:  heparin  1,100 Units/hr (11/08/20 1203)   PRN Meds:   Allergies:   No Known Allergies  Social History:   Social History   Socioeconomic History   Marital status: Widowed    Spouse name: Not on file   Number of children: Not on file   Years of education: Not on file   Highest education level: Not on file  Occupational History   Not on file  Tobacco Use   Smoking status: Former    Packs/day: 1.00    Years: 28.00    Pack years: 28.00    Types: Cigarettes    Quit date: 09/22/1978    Years since quitting: 42.1   Smokeless tobacco: Never  Vaping Use   Vaping Use: Never used  Substance and Sexual Activity   Alcohol use: No    Alcohol/week: 0.0 standard drinks   Drug use: No   Sexual activity: Not on file  Other Topics Concern   Not on file  Social History Narrative   Not on file   Social Determinants of Health   Financial Resource Strain: Not on file  Food Insecurity: Not on file  Transportation Needs: Not on file  Physical Activity: Not on file  Stress: Not on file  Social Connections: Not on file  Intimate Partner Violence: Not on file    Family History:    Family History  Problem Relation Age of Onset   Heart attack Mother    Diabetes Mother    Heart attack Father    Heart attack Brother    Anesthesia problems Neg Hx    Hypotension Neg Hx    Malignant hyperthermia Neg Hx    Pseudochol deficiency Neg Hx      ROS:  Please see the history of present illness.   All other ROS reviewed and negative.     Physical Exam/Data:   Vitals:   11/08/20 1030 11/08/20 1100 11/08/20 1200 11/08/20 1230  BP: 138/84 102/60 103/68 113/72  Pulse: (!) 122 (!) 110 (!) 111 (!) 106  Resp: (!) 23 (!) 28 (!) 27 (!) 26  Temp:      TempSrc:      SpO2: 95% 96% 97% 95%    Intake/Output Summary (Last 24 hours) at 11/08/2020 1353 Last data filed at 11/08/2020 1242 Gross per 24 hour  Intake 187.79 ml  Output --  Net 187.79 ml   Last 3 Weights 10/28/2020 10/27/2020 10/26/2020  Weight  (lbs) 232 lb 12.9 oz 229 lb 15 oz 235 lb 0.2 oz  Weight (kg) 105.6 kg 104.3 kg 106.6 kg     There is no height or weight on file to calculate BMI.  General:  Well nourished, well developed, in no acute distress HEENT: normal Lymph: no adenopathy Neck: no JVD Endocrine:  No thryomegaly Vascular: No carotid bruits; FA pulses 2+ bilaterally without bruits  Cardiac:  normal S1, S2; RRR; no murmur  Lungs:  course breath sounds  Abd: soft, nontender, no hepatomegaly  Ext: no edema Musculoskeletal:  No deformities, BUE and BLE strength normal and equal Skin: warm and dry  Neuro:  CNs 2-12 intact, no focal abnormalities noted Psych:  Normal affect   EKG:  The EKG was personally reviewed and demonstrates:  ST< 120bpm, PVCs, nonspecific T wave changes Telemetry:  Telemetry was personally reviewed and demonstrates:  SR PVCs, HR 80s  Relevant CV Studies:  Echo 10/07/20  1. LVEF is severely depressed with severe hypokineis of the mid/distal  ventricle; distal anterior, dsital inferior and apical akinesis. Note apex  is not well seen in this bedside study COmpared to 2021 no significant  change . Left ventricular ejection  fraction, by estimation, is 20%%. The left ventricular internal cavity  size was severely dilated. Left ventricular diastolic parameters are  indeterminate.   2. Right ventricular systolic function is normal. The right ventricular  size is normal. There is normal pulmonary artery systolic pressure.   3. Trivial mitral valve regurgitation.   4. The aortic valve is tricuspid. Aortic valve regurgitation is not  visualized. Mild aortic valve sclerosis is present, with no evidence of  aortic valve stenosis.   MPI 2017   IMPRESSION: 1. Large fixed defect at the apex extending into the anterior and inferior walls. Concern for peri-infarct ischemia in the anterior wall and lower septum.   2. Severe diffuse hypokinesia with akinesia in the areas of scar.   3. Left  ventricular ejection fraction 26%   4. Non invasive risk stratification*: High risk   *2012 Appropriate Use Criteria for Coronary Revascularization Focused Update: J Am Coll Cardiol. N6492421. http://content.airportbarriers.com.aspx?articleid=1201161     Electronically Signed   By: Rolm Baptise M.D.   On: 09/03/2015 13:09   Cardiac cath 2016 Prox LAD lesion, 80% stenosed. Mid LAD lesion, 100% stenosed. 1st Diag-1 lesion, 90% stenosed. 1st Diag-2 lesion, 100% stenosed. 1st Mrg lesion, 100% stenosed. 2nd Mrg lesion, 90% stenosed. Prox RCA to Mid RCA lesion, 90% stenosed. Mid RCA to Dist RCA lesion, 100% stenosed. Acute Mrg lesion, 90% stenosed. SVG . Origin lesion, before 1st Diag, 100% stenosed. Origin lesion, 100% stenosed. Origin lesion, 100% stenosed. There is moderate to severe left ventricular systolic dysfunction.   1. Severe 3 vessel obstructive/occlusive CAD. 2. All grafts are occluded including SVG to diagonal/LAD, SVG to OM1, and SVG to RCA. 3. Moderate to severe LV dysfunction. 4. The LIMA is widely patent as was not used previously for bypass.   Recommendation: consider for redo CABG    Laboratory Data:  High Sensitivity Troponin:   Recent Labs  Lab 10/23/20 2326 10/24/20 0502 10/24/20 0712 11/08/20 0935 11/08/20 1120  TROPONINIHS 43* 60* 56* 449* 841*     Chemistry Recent Labs  Lab 11/08/20 0935  NA 140  K 5.4*  CL 95*  CO2 33*  GLUCOSE 150*  BUN 35*  CREATININE 0.89  CALCIUM 9.4  GFRNONAA >60  ANIONGAP 12    Recent Labs  Lab  11/08/20 0935  PROT 7.6  ALBUMIN 3.8  AST 46*  ALT 31  ALKPHOS 67  BILITOT 0.6   Hematology Recent Labs  Lab 11/08/20 0935  WBC 17.4*  RBC 3.84*  HGB 12.0*  HCT 38.1*  MCV 99.2  MCH 31.3  MCHC 31.5  RDW 13.2  PLT 300   BNP Recent Labs  Lab 11/08/20 0935  BNP 1,087.0*    DDimer No results for input(s): DDIMER in the last 168 hours.   Radiology/Studies:  CT HEAD WO CONTRAST  (5MM)  Result Date: 11/08/2020 CLINICAL DATA:  Mental status change, unknown cause. EXAM: CT HEAD WITHOUT CONTRAST TECHNIQUE: Contiguous axial images were obtained from the base of the skull through the vertex without intravenous contrast. COMPARISON:  Prior head CT examinations 02/03/2019 and earlier. FINDINGS: Brain: Redemonstrated chronic cortical/subcortical infarct within the right frontal, parietal and temporal lobes. Associated ex vacuo dilatation of the right lateral ventricle. Background cerebral volume is normal for age. There is no acute intracranial hemorrhage. No acute demarcated cortical infarct. No extra-axial fluid collection. No evidence of an intracranial mass. No midline shift. Vascular: No hyperdense vessel.  Atherosclerotic calcifications. Skull: Normal. Negative for fracture or focal lesion. Sinuses/Orbits: Visualized orbits show no acute finding. Left maxillary sinus mucous retention cysts measuring up to 13 mm. Trace mucosal thickening within the right maxillary sinus. Trace bilateral ethmoid sinus mucosal thickening. Redemonstrated small foci of polypoid soft tissue within the right nasal passage. IMPRESSION: No evidence of acute intracranial abnormality. Redemonstrated chronic cortical/subcortical infarct within the right frontal, parietal and temporal lobes. Paranasal sinus disease, as described. Electronically Signed   By: Kellie Simmering DO   On: 11/08/2020 10:28   DG Chest Port 1 View  Result Date: 11/08/2020 CLINICAL DATA:  Questionable sepsis - evaluate for abnormality. EXAM: PORTABLE CHEST 1 VIEW COMPARISON:  Prior chest radiographs 10/25/2020 and earlier. CT angiogram chest 09/15/2019 FINDINGS: Prior median sternotomy. Unchanged cardiomegaly. Small to moderate bilateral pleural effusions. Redemonstrated scarring within the left mid lung. Additional patchy and ill-defined opacities within the bilateral mid to lower lung fields may reflect atelectasis, edema or pneumonia. No evidence  of pneumothorax. No acute bony abnormality identified. IMPRESSION: Small-to-moderate bilateral pleural effusions. Additional patchy and ill-defined opacities within the bilateral mid-to-lower lung fields, which may reflect atelectasis, edema or pneumonia. Redemonstrated scarring within the left mid lung. Unchanged cardiomegaly. Electronically Signed   By: Kellie Simmering DO   On: 11/08/2020 10:32     Assessment and Plan:   Sepsis from [facility] Acute respiratory failure with hypoxia AMS - brought from [facility] for AMS and hypoxia on NRB.  - labs showed lactic acidosis, elevated CO2, and leukocytosis - CXR with possible PNA - IV abx per IM - cautious with IVF given CHF with low EF - Blood cultures collexted - On Bipap  Elevated troponin CAD s/p redo CABG in 2016 - No chest pain reported but difficult to assess symptoms given AMS - In prior outpatient notes, ischemic eval was recommended for reduced EF, but patient declined cardiac cath - Hs troponin trending up - EKG with no new changes - IV heparin - continue to trend troponin - known EF <20% by recent echo - Aspirin suppository - No ischemic eval given acute infection as well as prior notes report patient refused cath.  ICM EF<20% by echo 0000000 Acute systolic Heart failure - BNP over 1000, CTA chest with b/l pleural effusions - PTA lasix '40mg'$  daily - started on IV lasix '40mg'$  daily -  BB held for acute CHF and NPO status - Hypotension previously limiting GDMT - spiro held for hyperkalemia - strict I/os - daily weights  Hyperkalemia - spironolactone discontinued - daily BMET  LV thrombus - PTA on coumadin - INR 1.4, started on IV heparin  HTN - history of hypotension on multiple cardiac medications, Entresto previously discontinued due to this - PTA Toprol '50mg'$  daily, spironolactone 12.'5mg'$  daily>>held on admission - monitor pressures  HLD - PTA simvastatin '40mg'$  daily  GOC - given multiple hospitalization  and comorbidities would consult hospice/palliative care  For questions or updates, please contact [facility] Please consult [email or web address] for contact info under    Signed, Halee Glynn Ninfa Meeker, PA-C  11/08/2020 1:53 PM

## 2020-11-08 NOTE — ED Notes (Signed)
Patient pulled off Bipap mask and IV out of right hand. Bipap mask reapplied, sats maintained and order for IV consult placed. Changed sheets and pad on bed. Fresh blankets placed on patient. Instructed to leave on Bipap mask, pt verbalized understanding.

## 2020-11-08 NOTE — ED Triage Notes (Signed)
Pt to ED via [facility] from [facility]. Per EMS staff stating pt was fine during morning rounds and when they went back to check on him pt was unresponsive and RA sats 55%. Pt placed on 15L NRB at facility with improvement to 100%. CBG 156. HR 112. RR 33. Staff stating pt usually able to interact at baseline. Upon arrival pt responsive to painful stimuli. Pt placed on 4L  with sats 96-98%.

## 2020-11-08 NOTE — ED Notes (Signed)
Lab called to obtain Heparin level

## 2020-11-08 NOTE — ED Notes (Signed)
Care transferred, report received from [place], [place]

## 2020-11-08 NOTE — H&P (Addendum)
History and Physical    Benjamin Cordova [id number] DOB: October 26, 1934 DOA: 11/08/2020  PCP: Pablo Lawrence, NP   Patient coming from: Skilled nursing facility [facility])  I have personally briefly reviewed patient's old medical records in [place]  Chief Complaint: Change in mental status/hypoxia Most of the history was obtained from his son who was at the bedside as well as from the EMR  HPI: Benjamin Cordova is a 86 y.o. male with medical history significant for coronary artery disease status post CABG, hypertension, stroke, ischemic cardiomyopathy, paroxysmal atrial fibrillation, history of LV mural thrombus, peripheral vascular disease who presents to the ER from [place] for evaluation of change in mental status and hypoxia. Patient's son states that he had seen him the day prior to his hospitalization and he was at his baseline mental status.  Per EMS the nursing home staff states that the patient was fine during rounds and at his baseline (able to interact with staff) and when they went back to check on him he was unresponsive with room air pulse oximetry of 55%.  He was then placed on 15 L oxygen by nonrebreather mask with improvement in his pulse oximetry to 100%.  His blood sugar was 156. Upon arrival to the ER he had an arterial blood gas done which showed uncompensated respiratory acidosis and so patient was placed on a BiPAP. I am unable to do review of systems on this patient due to his mental status Venous blood gas 7.30/79/53/38.9/83.2 Labs show sodium 140, potassium 5.4, chloride 95, bicarb 33, glucose 150, BUN 35, creatinine 0.89, calcium 9.4, magnesium 2.2, alkaline phosphatase 67, albumin 3.8, AST 46, ALT 31, total protein 7.6, BNP 1087, troponin 449 >> 841, lactic acid 2.1 >> 2.9,, white count 17.4, hemoglobin 12.0, hematocrit 38.1, MCV 99.2, RDW 13.2, platelet count 300, PT 17.0, INR 1.4 Respiratory viral panel is negative CT scan of the head without  contrast shows no evidence of acute intracranial abnormality.  Redemonstrated chronic cortical/subcortical infarct within the right frontal, parietal and temporal lobes. Chest x-ray reviewed by me shows small to moderate bilateral pleural effusions.  Patchy ill-defined opacities within the bilateral mid to lower lung fields which may reflect edema or pneumonia. Twelve-lead EKG reviewed by me shows sinus tachycardia     ED Course: Patient is an 86 year old male who presents to the emergency room from skilled nursing facility where he had gone for subacute rehab following a fall with multiple rib fractures for evaluation of mental status changes. According to the nursing home staff patient was at his baseline mental status this morning and when the staff had come back to check on him he was altered. Arterial blood gas showed uncompensated respiratory acidosis. Patient has marked leukocytosis with x-ray finding suggestive of possible pneumonia as well as bilateral pleural effusions. He also has an up trending troponin level He will be admitted to the hospital for further evaluation. Of note this is the patient's fourth hospitalization since his fall on October 03, 2020   Review of Systems: As per HPI otherwise all other systems reviewed and negative.    Past Medical History:  Diagnosis Date   Arthritis    Coronary artery disease    a. s/p CABG in 1981 b. cath in 10/2014 showing occluded grafts --> redo CABG in 2016 LIMA-LAD and Left Radial-OM2   Essential hypertension    Hematuria    History of blood transfusion 07/2015   Knee replacement HgB <9   History  of pneumonia    History of stroke    Ischemic cardiomyopathy    Left ventricular apical thrombus    Mixed hyperlipidemia    Myocardial infarction [facility]) 1980   Peripheral vascular disease ([place])    Polio osteopathy of lower leg (HCC) Age 50   Affected right leg   Skin cancer    TIA (transient ischemic attack) 1989   Chronic coumadin    UTI (lower urinary tract infection)     Past Surgical History:  Procedure Laterality Date   CARDIAC CATHETERIZATION N/A 10/05/2014   Procedure: Left Heart Cath and Coronary Angiography;  Surgeon: Peter M Martinique, MD;  Location: [place] [facility];  Service: Cardiovascular;  Laterality: N/A;   CORONARY ARTERY BYPASS GRAFT  1981   [facility]   CORONARY ARTERY BYPASS GRAFT N/A 10/08/2014   Procedure: REDO CORONARY ARTERY BYPASS GRAFTING (CABG) Times Two Grafts using Left Internal Mammary and Left Radial Artery;  Surgeon: Melrose Nakayama, MD;  Location: [place];  Service: Open Heart Surgery;  Laterality: N/A;   CYSTOSCOPY WITH INJECTION  10/10/2010   Procedure: CYSTOSCOPY WITH INJECTION;  Surgeon: Marissa Nestle;  Location: [facility];  Service: Urology;  Laterality: N/A;  with retrograde urethrogram   FOOT SURGERY Right 1954   [facility], muscle implantation   JOINT REPLACEMENT     RADIAL ARTERY HARVEST Left 10/08/2014   Procedure: Left RADIAL ARTERY HARVEST;  Surgeon: Melrose Nakayama, MD;  Location: [place];  Service: Open Heart Surgery;  Laterality: Left;   TEE WITHOUT CARDIOVERSION N/A 10/08/2014   Procedure: TRANSESOPHAGEAL ECHOCARDIOGRAM (TEE);  Surgeon: Melrose Nakayama, MD;  Location: [place];  Service: Open Heart Surgery;  Laterality: N/A;   TONSILLECTOMY  1949   [facility]   TOTAL KNEE ARTHROPLASTY Left 07/27/2015   Procedure: TOTAL LEFT KNEE ARTHROPLASTY;  Surgeon: [name], MD;  Location: [place];  Service: Orthopedics;  Laterality: Left;     reports that he quit smoking about 42 years ago. His smoking use included cigarettes. He has a 28.00 pack-year smoking history. He has never used smokeless tobacco. He reports that he does not drink alcohol and does not use drugs.  No Known Allergies  Family History  Problem Relation Age of Onset   Heart attack Mother    Diabetes Mother    Heart attack Father    Heart attack Brother    Anesthesia problems Neg Hx    Hypotension Neg Hx     Malignant hyperthermia Neg Hx    Pseudochol deficiency Neg Hx       Prior to Admission medications   Medication Sig Start Date End Date Taking? Authorizing Provider  acetaminophen (TYLENOL) 325 MG tablet Take 2 tablets (650 mg total) by mouth every 6 (six) hours. 10/11/20   Johnson, Clanford L, MD  albuterol (PROVENTIL) (2.5 MG/3ML) 0.083% nebulizer solution Take 2.5 mg by nebulization every 6 (six) hours as needed for wheezing or shortness of breath.  08/29/19   [provider]  ascorbic acid (VITAMIN C) 500 MG tablet Take 1,000 mg by mouth 2 (two) times daily.    [provider]  diphenhydrAMINE (BENADRYL) 50 MG capsule Take 1 capsule (50 mg total) by mouth at bedtime as needed for sleep. 10/11/20   Johnson, Clanford L, MD  finasteride (PROSCAR) 5 MG tablet Take 5 mg by mouth daily. 04/20/19   [provider]  folic acid (FOLVITE) [id number] MCG tablet Take 800 mcg by mouth daily.    [provider]  furosemide (LASIX) 40 MG tablet Take 1 tablet (40 mg total) by mouth daily. 10/11/20   Johnson, Clanford L, MD  ipratropium-albuterol (DUONEB) 0.5-2.5 (3) MG/3ML SOLN Take 3 mLs by nebulization every 4 (four) hours as needed (SOB/wheezing).    [provider]  metoprolol succinate (TOPROL-XL) 50 MG 24 hr tablet Take 1 tablet (50 mg total) by mouth daily. Take with or immediately following a meal. 10/29/20   Tat, Shanon Brow, MD  Multiple Vitamins-Minerals (CERTAVITE/ANTIOXIDANTS) TABS Take 1 tablet by mouth daily.     [provider]  potassium chloride (K-DUR) 10 MEQ tablet Take 10 mEq by mouth daily. Take with Lasix (Furosemide) 03/18/18   [provider]  senna-docusate (SENOKOT-S) 8.6-50 MG tablet Take 1 tablet by mouth at bedtime. 10/11/20   Johnson, Clanford L, MD  simvastatin (ZOCOR) 40 MG tablet Take 40 mg by mouth at bedtime.     [provider]  spironolactone (ALDACTONE) 25 MG tablet Take 0.5 tablets (12.5 mg total) by mouth daily.  11/10/19 10/26/20  Strader, Fransisco Hertz, PA-C  tamsulosin (FLOMAX) 0.4 MG CAPS capsule Take 0.4 mg by mouth in the morning.  12/02/18   [provider]  warfarin (COUMADIN) 5 MG tablet Take 1.5 tablets (7.5 mg total) by mouth daily at 6 PM. 10/28/20   Orson Eva, MD    Physical Exam: Vitals:   11/08/20 1030 11/08/20 1100 11/08/20 1200 11/08/20 1230  BP: 138/84 102/60 103/68 113/72  Pulse: (!) 122 (!) 110 (!) 111 (!) 106  Resp: (!) 23 (!) 28 (!) 27 (!) 26  Temp:      TempSrc:      SpO2: 95% 96% 97% 95%     Vitals:   11/08/20 1030 11/08/20 1100 11/08/20 1200 11/08/20 1230  BP: 138/84 102/60 103/68 113/72  Pulse: (!) 122 (!) 110 (!) 111 (!) 106  Resp: (!) 23 (!) 28 (!) 27 (!) 26  Temp:      TempSrc:      SpO2: 95% 96% 97% 95%      Constitutional: Lethargic.  Opens eyes to deep sternal rub. Not in any apparent distress.  On BiPAP HEENT:      Head: Normocephalic and atraumatic.         Eyes: PERLA, EOMI, Conjunctivae are normal. Sclera is non-icteric.       Mouth/Throat: Mucous membranes are moist.       Neck: Supple with no signs of meningismus. Cardiovascular: Tachycardia. No murmurs, gallops, or rubs. 2+ symmetrical distal pulses are present . No JVD. Lt leg >> Rt Leg Respiratory: Tachypnea.crackles at the lung bases bilaterally.  Scattered rhonchi Gastrointestinal: Soft, non tender, and non distended with positive bowel sounds.  Genitourinary: No CVA tenderness. Musculoskeletal: Nontender with normal range of motion in all extremities. No cyanosis, or erythema of extremities. Neurologic: Unable to assess. Skin: Skin is warm, dry.  No rash or ulcers Psychiatric: Unable to assess   Labs on Admission: I have personally reviewed following labs and imaging studies  CBC: Recent Labs  Lab 11/08/20 0935  WBC 17.4*  NEUTROABS 15.3*  HGB 12.0*  HCT 38.1*  MCV 99.2  PLT [id number]   Basic Metabolic Panel: Recent Labs  Lab 11/08/20 0935  NA 140  K 5.4*  CL 95*  CO2 33*   GLUCOSE 150*  BUN 35*  CREATININE 0.89  CALCIUM 9.4  MG 2.2   GFR: Estimated Creatinine Clearance: 74.1 mL/min (by C-G formula based on SCr of 0.89 mg/dL). Liver  Function Tests: Recent Labs  Lab 11/08/20 0935  AST 46*  ALT 31  ALKPHOS 67  BILITOT 0.6  PROT 7.6  ALBUMIN 3.8   No results for input(s): LIPASE, AMYLASE in the last 168 hours. No results for input(s): AMMONIA in the last 168 hours. Coagulation Profile: Recent Labs  Lab 11/08/20 0935  INR 1.4*   Cardiac Enzymes: No results for input(s): CKTOTAL, CKMB, CKMBINDEX, TROPONINI in the last 168 hours. BNP (last 3 results) No results for input(s): PROBNP in the last 8760 hours. HbA1C: No results for input(s): HGBA1C in the last 72 hours. CBG: No results for input(s): GLUCAP in the last 168 hours. Lipid Profile: No results for input(s): CHOL, HDL, LDLCALC, TRIG, CHOLHDL, LDLDIRECT in the last 72 hours. Thyroid Function Tests: No results for input(s): TSH, T4TOTAL, FREET4, T3FREE, THYROIDAB in the last 72 hours. Anemia Panel: No results for input(s): VITAMINB12, FOLATE, FERRITIN, TIBC, IRON, RETICCTPCT in the last 72 hours. Urine analysis:    Component Value Date/Time   COLORURINE YELLOW 10/23/2020 2155   APPEARANCEUR CLEAR 10/23/2020 2155   LABSPEC 1.012 10/23/2020 2155   PHURINE 5.0 10/23/2020 2155   GLUCOSEU NEGATIVE 10/23/2020 2155   HGBUR NEGATIVE 10/23/2020 2155   BILIRUBINUR NEGATIVE 10/23/2020 2155   KETONESUR NEGATIVE 10/23/2020 2155   PROTEINUR NEGATIVE 10/23/2020 2155   UROBILINOGEN 1.0 10/07/2014 1638   NITRITE NEGATIVE 10/23/2020 2155   LEUKOCYTESUR TRACE (A) 10/23/2020 2155    Radiological Exams on Admission: CT HEAD WO CONTRAST (5MM)  Result Date: 11/08/2020 CLINICAL DATA:  Mental status change, unknown cause. EXAM: CT HEAD WITHOUT CONTRAST TECHNIQUE: Contiguous axial images were obtained from the base of the skull through the vertex without intravenous contrast. COMPARISON:  Prior head CT  examinations 02/03/2019 and earlier. FINDINGS: Brain: Redemonstrated chronic cortical/subcortical infarct within the right frontal, parietal and temporal lobes. Associated ex vacuo dilatation of the right lateral ventricle. Background cerebral volume is normal for age. There is no acute intracranial hemorrhage. No acute demarcated cortical infarct. No extra-axial fluid collection. No evidence of an intracranial mass. No midline shift. Vascular: No hyperdense vessel.  Atherosclerotic calcifications. Skull: Normal. Negative for fracture or focal lesion. Sinuses/Orbits: Visualized orbits show no acute finding. Left maxillary sinus mucous retention cysts measuring up to 13 mm. Trace mucosal thickening within the right maxillary sinus. Trace bilateral ethmoid sinus mucosal thickening. Redemonstrated small foci of polypoid soft tissue within the right nasal passage. IMPRESSION: No evidence of acute intracranial abnormality. Redemonstrated chronic cortical/subcortical infarct within the right frontal, parietal and temporal lobes. Paranasal sinus disease, as described. Electronically Signed   By: Kellie Simmering DO   On: 11/08/2020 10:28   CT Angio Chest PE W and/or Wo Contrast  Result Date: 11/08/2020 CLINICAL DATA:  PE suspected, high prob.  Hypoxia. EXAM: CT ANGIOGRAPHY CHEST WITH CONTRAST TECHNIQUE: Multidetector CT imaging of the chest was performed using the standard protocol during bolus administration of intravenous contrast. Multiplanar CT image reconstructions and MIPs were obtained to evaluate the vascular anatomy. CONTRAST:  85m OMNIPAQUE IOHEXOL 350 MG/ML SOLN COMPARISON:  09/15/2019 FINDINGS: Cardiovascular: Pulmonary arterial opacification is adequate without evidence of emboli allowing for motion artifact which intermittently limits assessment of segmental and subsegmental vessels. There is moderate cardiomegaly including prominent left ventricular dilatation. Calcification is noted at the left ventricular  apex consistent with previous myocardial infarction. There is no pericardial effusion. Three-vessel coronary atherosclerosis is noted. There is thoracic aortic atherosclerosis without aneurysm. Prior CABG. Mediastinum/Nodes: No enlarged axillary, mediastinal, or hilar lymph nodes.  Unremarkable thyroid and esophagus. Lungs/Pleura: Moderate respiratory motion artifact. Moderate right and small left pleural effusions which are partially loculated and extend into the fissures. Associated compressive atelectasis greatest in the right greater than left lower lobes. Chronic scarring or atelectasis in the left upper lobe. Upper Abdomen: No acute finding. Musculoskeletal: Mildly displaced right lateral sixth, seventh, and eighth rib fractures with early callus formation. Review of the MIP images confirms the above findings. IMPRESSION: 1. No evidence of pulmonary emboli. 2. Moderate right and small left pleural effusions and atelectasis. 3. Subacute right rib fractures. 4. Aortic Atherosclerosis (ICD10-I70.0). Electronically Signed   By: Logan Bores M.D.   On: 11/08/2020 14:01   DG Chest Port 1 View  Result Date: 11/08/2020 CLINICAL DATA:  Questionable sepsis - evaluate for abnormality. EXAM: PORTABLE CHEST 1 VIEW COMPARISON:  Prior chest radiographs 10/25/2020 and earlier. CT angiogram chest 09/15/2019 FINDINGS: Prior median sternotomy. Unchanged cardiomegaly. Small to moderate bilateral pleural effusions. Redemonstrated scarring within the left mid lung. Additional patchy and ill-defined opacities within the bilateral mid to lower lung fields may reflect atelectasis, edema or pneumonia. No evidence of pneumothorax. No acute bony abnormality identified. IMPRESSION: Small-to-moderate bilateral pleural effusions. Additional patchy and ill-defined opacities within the bilateral mid-to-lower lung fields, which may reflect atelectasis, edema or pneumonia. Redemonstrated scarring within the left mid lung. Unchanged  cardiomegaly. Electronically Signed   By: Kellie Simmering DO   On: 11/08/2020 10:32     Assessment/Plan Principal Problem:   Sepsis ([place]) Active Problems:   Coronary artery disease   Paroxysmal atrial fibrillation (HCC)   Essential hypertension   S/P CABG x 2   LV (left ventricular) mural thrombus   Acute on chronic respiratory failure with hypoxia and hypercapnia (HCC)   Acute on chronic systolic HF (heart failure) (HCC)   Hyperkalemia   Acute metabolic encephalopathy   NSVT (nonsustained ventricular tachycardia) (HCC)   NSTEMI (non-ST elevated myocardial infarction) ([place])      Sepsis (POA) from HCAP with acute respiratory failure and acute encephalopathy As evidenced by tachycardia, tachypnea, marked leukocytosis, lactic acidosis and chest x-ray findings suggestive of patchy bilateral opacities Judicious IV fluid resuscitation since patient is at risk of developing fluid overload Treat patient empirically with cefepime and vancomycin for healthcare associated pneumonia Follow-up results of blood cultures    Acute on chronic respiratory failure with hypoxia and hypercapnia Patient presented in respiratory distress and was found to be hypoxic with room air pulse oximetry of 55% Arterial blood gas showed uncompensated respiratory acidosis with hypercapnia He is currently on a BiPAP to reduce work of breathing and improve oxygenation Will repeat arterial blood gas in 2 hours    Non-ST elevation MI Patient has a history of coronary artery disease status post CABG and presents to the ER for evaluation of worsening shortness of breath Twelve-lead EKG shows sinus tachycardia without any acute ST-T wave changes Patient noted to have an uptrending troponin Continue heparin drip initiated in the ER Aspirin per rectum Hold metoprolol for now due to mental status changes since patient is unable to take p.o. at this time  We will consult cardiology Last 2D echocardiogram showed an LVEF  of 20% with regional wall motion abnormality     Acute on chronic systolic heart failure Patient's last known LVEF was 20% Continue Lasix 40 mg IV daily Hold metoprolol and spironolactone for now    History of paroxysmal atrial fibrillation/LV thrombus Patient was on Coumadin with subtherapeutic INR Continue heparin drip until INR  becomes therapeutic Check daily PT/INR    Hyperkalemia Most likely related to spironolactone use Treat patient with dextrose and insulin    Episode of nonsustained ventricular tachycardia Check electrolytes and supplement as needed    Acute metabolic encephalopathy Patient presented to the ER for evaluation of altered mental status and only responded to painful stimuli ABG showed uncompensated respiratory acidosis Metabolic encephalopathy appears to be secondary to hypercapnia Expect improvement in patient's mental status following resolution of hypercapnia Keep patient n.p.o. for now and hold all oral medications   DVT prophylaxis: Heparin Code Status: DO NOT RESUSCITATE Family Communication: Greater than 50% of time was spent discussing patient's condition and plan of care with his son at the bedside.  He understands that his overall prognosis is poor.  CODE STATUS was discussed and patient is a DNR Disposition Plan: Back to previous home environment Consults called: Cardiology Status: At the time of admission, it appears that the appropriate admission status for this patient is inpatient. This is judged to be reasonable and necessary in order to provide the required intensity of service to ensure the patient's safety given the presenting symptoms, physical exam findings, and initial radiographic and laboratory data in the context of their comorbid conditions. Patient requires inpatient status due to high intensity of service, high risk of further deterioration and high frequency of surveillance required.    Collier Bullock MD [facility]     11/08/2020, 2:04 PM

## 2020-11-09 ENCOUNTER — Other Ambulatory Visit: Payer: Self-pay

## 2020-11-09 DIAGNOSIS — I5023 Acute on chronic systolic (congestive) heart failure: Secondary | ICD-10-CM | POA: Diagnosis not present

## 2020-11-09 DIAGNOSIS — Z951 Presence of aortocoronary bypass graft: Secondary | ICD-10-CM

## 2020-11-09 DIAGNOSIS — I214 Non-ST elevation (NSTEMI) myocardial infarction: Secondary | ICD-10-CM | POA: Diagnosis not present

## 2020-11-09 LAB — PROCALCITONIN: Procalcitonin: <0.1 ng/mL

## 2020-11-09 LAB — CBC
HCT: 32.9 % — ABNORMAL LOW (ref 39.0–52.0)
Hemoglobin: 10.2 g/dL — ABNORMAL LOW (ref 13.0–17.0)
MCH: 30.5 pg (ref 26.0–34.0)
MCHC: 31 g/dL (ref 30.0–36.0)
MCV: 98.5 fL (ref 80.0–100.0)
Platelets: 174 10*3/uL (ref 150–400)
RBC: 3.34 MIL/uL — ABNORMAL LOW (ref 4.22–5.81)
RDW: 13.3 % (ref 11.5–15.5)
WBC: 9.2 10*3/uL (ref 4.0–10.5)
nRBC: 0 % (ref 0.0–0.2)

## 2020-11-09 LAB — PROTIME-INR
INR: 1.9 — ABNORMAL HIGH (ref 0.8–1.2)
Prothrombin Time: 22 seconds — ABNORMAL HIGH (ref 11.4–15.2)

## 2020-11-09 LAB — BASIC METABOLIC PANEL
Anion gap: 10 (ref 5–15)
BUN: 41 mg/dL — ABNORMAL HIGH (ref 8–23)
CO2: 35 mmol/L — ABNORMAL HIGH (ref 22–32)
Calcium: 9.1 mg/dL (ref 8.9–10.3)
Chloride: 97 mmol/L — ABNORMAL LOW (ref 98–111)
Creatinine, Ser: 0.89 mg/dL (ref 0.61–1.24)
GFR, Estimated: >60 mL/min (ref >60)
Glucose, Bld: 109 mg/dL — ABNORMAL HIGH (ref 70–99)
Potassium: 4.7 mmol/L (ref 3.5–5.1)
Sodium: 142 mmol/L (ref 135–145)

## 2020-11-09 LAB — CORTISOL-AM, BLOOD: Cortisol - AM: 15.9 ug/dL (ref 6.7–22.6)

## 2020-11-09 LAB — HEPARIN LEVEL (UNFRACTIONATED)
Heparin Unfractionated: 0.23 IU/mL — ABNORMAL LOW (ref 0.30–0.70)
Heparin Unfractionated: 0.31 IU/mL (ref 0.30–0.70)

## 2020-11-09 MED ORDER — SENNOSIDES-DOCUSATE SODIUM 8.6-50 MG PO TABS
1.0000 | ORAL_TABLET | Freq: Every day | ORAL | Status: DC
  Administered 2020-11-09 – 2020-11-13 (×4): 1 via ORAL
  Filled 2020-11-09 (×5): qty 1

## 2020-11-09 MED ORDER — WARFARIN SODIUM 4 MG PO TABS: 8.0000 mg | ORAL_TABLET | Freq: Once | ORAL | Status: DC

## 2020-11-09 MED ORDER — TAMSULOSIN HCL 0.4 MG PO CAPS
0.4000 mg | ORAL_CAPSULE | Freq: Every morning | ORAL | Status: DC
  Administered 2020-11-10 – 2020-11-14 (×5): 0.4 mg via ORAL
  Filled 2020-11-09 (×5): qty 1

## 2020-11-09 MED ORDER — CHLORHEXIDINE GLUCONATE CLOTH 2 % EX PADS
6.0000 | MEDICATED_PAD | Freq: Every day | CUTANEOUS | Status: DC
  Administered 2020-11-10 – 2020-11-11 (×2): 6 via TOPICAL

## 2020-11-09 MED ORDER — SIMVASTATIN 20 MG PO TABS
40.0000 mg | ORAL_TABLET | Freq: Every day | ORAL | Status: DC
  Administered 2020-11-09 – 2020-11-13 (×4): 40 mg via ORAL
  Filled 2020-11-09 (×5): qty 2

## 2020-11-09 MED ORDER — SPIRONOLACTONE 25 MG PO TABS
12.5000 mg | ORAL_TABLET | Freq: Every day | ORAL | Status: DC
  Administered 2020-11-10 – 2020-11-14 (×5): 12.5 mg via ORAL
  Filled 2020-11-09: qty 0.5
  Filled 2020-11-09 (×2): qty 1
  Filled 2020-11-09: qty 0.5
  Filled 2020-11-09 (×2): qty 1
  Filled 2020-11-09 (×3): qty 0.5
  Filled 2020-11-09: qty 1

## 2020-11-09 MED ORDER — WARFARIN - PHARMACIST DOSING INPATIENT: Freq: Every day | Status: DC

## 2020-11-09 MED ORDER — HEPARIN BOLUS VIA INFUSION
1300.0000 [IU] | Freq: Once | INTRAVENOUS | Status: AC
  Administered 2020-11-09: 1300 [IU] via INTRAVENOUS
  Filled 2020-11-09: qty 1300

## 2020-11-09 MED ORDER — FINASTERIDE 5 MG PO TABS
5.0000 mg | ORAL_TABLET | Freq: Every day | ORAL | Status: DC
  Administered 2020-11-09 – 2020-11-14 (×6): 5 mg via ORAL
  Filled 2020-11-09 (×6): qty 1

## 2020-11-09 MED ORDER — METOPROLOL SUCCINATE ER 50 MG PO TB24
50.0000 mg | ORAL_TABLET | Freq: Every day | ORAL | Status: DC
  Administered 2020-11-09 – 2020-11-14 (×6): 50 mg via ORAL
  Filled 2020-11-09 (×6): qty 1

## 2020-11-09 NOTE — ED Notes (Signed)
IV team at the bedside. 

## 2020-11-09 NOTE — Consult Note (Signed)
[place] for IV heparin/warfarin Indication: chest pain/ACS; Atrial fibrillation  Patient Measurements: Heparin Dosing Weight: 91 kg  Labs: Recent Labs    11/08/20 0935 11/08/20 1120 11/08/20 1533 11/08/20 1813 11/08/20 2136 11/09/20 0631 11/09/20 2011  HGB 12.0*  --   --   --   --  10.2*  --   HCT 38.1*  --   --   --   --  32.9*  --   PLT 300  --   --   --   --  174  --   APTT 35  --   --   --   --   --   --   LABPROT 17.0*  --   --   --   --  22.0*  --   INR 1.4*  --   --   --   --  1.9*  --   HEPARINUNFRC  --   --   --   --  0.41 0.23* 0.31  CREATININE 0.89  --   --   --   --  0.89  --   TROPONINIHS 449* 841* 2,805* 3,275*  --   --   --      Estimated Creatinine Clearance: 74.1 mL/min (by C-G formula based on SCr of 0.89 mg/dL).   Medical History: Past Medical History:  Diagnosis Date   Arthritis    Coronary artery disease    a. s/p CABG in 1981 b. cath in 10/2014 showing occluded grafts --> redo CABG in 2016 LIMA-LAD and Left Radial-OM2   Essential hypertension    Hematuria    History of blood transfusion 07/2015   Knee replacement HgB <9   History of pneumonia    History of stroke    Ischemic cardiomyopathy    Left ventricular apical thrombus    Mixed hyperlipidemia    Myocardial infarction [facility]) 1980   Peripheral vascular disease ([place])    Polio osteopathy of lower leg ([place]) Age 41   Affected right leg   Skin cancer    TIA (transient ischemic attack) 1989   Chronic coumadin   UTI (lower urinary tract infection)     Medications:  Warfarin 8 mg daily prior to admission (last PTA dose 8/8) Prior chronic regimen was 10 mg PO 6 days/week, and 5 mg daily on Fridays (TWD = 65 mg), during recent hospital admission 10/23/20 - 10/28/20, patient received 10 mg daily. Discharge orders to SNF where warfarin dose was decreased to 7.5 mg daily starting 10/29/2020 with discharge orders for INR check on 10/31/20. Dose was recently  increased to 8 mg daily at SNF (TWD = 56 mg)    Assessment: Patient is an 86 y/o M with medical history as above and including CAD s/p CABG, PVD, LV thrombus, TIA / CVA who was BIBEMS from [facility] for unresponsiveness and respiratory failure. Troponin elevated to 449. Pharmacy has been consulted to initiate heparin infusion for suspected ACS.  Baseline H&H, platelets acceptable. Baseline INR 1.4 (on warfarin as outpatient), aPTT 35.   Warfarin: Patient on chronic warfarin therapy for paroxymal atrial fibrillation, recurrent TIAs, and LV thrombus. Prior chronic regimen was 10 mg PO 6 days/week, and 5 mg daily on Fridays (TWD = 65 mg), during recent hospital admission 10/23/20 - 10/28/20, patient received 10 mg daily. Discharge orders to SNF where warfarin dose was decreased to 7.5 mg daily starting 10/29/2020 with discharge orders for INR check on 10/31/20. Dose was recently increased  to 8 mg daily at SNF (TWD = 56 mg). INR on admission 1.4 (subtherapeutic). Pharmacy has been consulted to restart warfarin therapy.   DD interactions: --Cefepime IV 3 doses -- last dose 8/10 @ [id number] -- discontinued   Date INR Warfarin Dose  8/8 -- 8 mg (PTA) 8/9 1.4 Held -- IV heparin infusion for ACS started  8/10 1.9   Goal of Therapy:  INR 2-3  Heparin level 0.3-0.7 units/ml Monitor platelets by anticoagulation protocol: Yes  0809 2136 HL 0.41, therapeutic; 1100 units/hr 0810 0631 HL 0.23, subtherapeutic; 1100 units/hr 0810 2011 HL 0.31, therapeutic; 1250 un/hr   Plan:  Warfarin: INR 1.4 (subtherapeutic on admission) >> 1.9  Although subtherapeutic on admission, will give home dose of 8 mg x 1 tonight given recent abx exposure INR daily  Heparin: --Heparin level is therapeutic, will continue heparin infusion at 1250 units/hr --Will recheck HL 8 hours for confirmation --Daily CBC per protocol while on IV heparin; H&H and platelets are down-trending today. Monitor closely for s/sx of  bleeding --Note INR trending up (1.4 >> 1.9). Continue to follow INR. May be at increased risk of bleeding given residual warfarin effect  Sherilyn Banker, PharmD Clinical Pharmacist   11/09/2020 8:44 PM

## 2020-11-09 NOTE — Consult Note (Addendum)
ANTICOAGULATION CONSULT NOTE  Pharmacy Consult for IV heparin Indication: chest pain/ACS  Patient Measurements: Heparin Dosing Weight: 91 kg  Labs: Recent Labs    11/08/20 0935 11/08/20 1120 11/08/20 1533 11/08/20 1813 11/08/20 2136 11/09/20 0631  HGB 12.0*  --   --   --   --  10.2*  HCT 38.1*  --   --   --   --  32.9*  PLT 300  --   --   --   --  174  APTT 35  --   --   --   --   --   LABPROT 17.0*  --   --   --   --  22.0*  INR 1.4*  --   --   --   --  1.9*  HEPARINUNFRC  --   --   --   --  0.41 0.23*  CREATININE 0.89  --   --   --   --  0.89  TROPONINIHS 449* 841* 2,805* 3,275*  --   --      Estimated Creatinine Clearance: 74.1 mL/min (by C-G formula based on SCr of 0.89 mg/dL).   Medical History: Past Medical History:  Diagnosis Date   Arthritis    Coronary artery disease    a. s/p CABG in 1981 b. cath in 10/2014 showing occluded grafts --> redo CABG in 2016 LIMA-LAD and Left Radial-OM2   Essential hypertension    Hematuria    History of blood transfusion 07/2015   Knee replacement HgB <9   History of pneumonia    History of stroke    Ischemic cardiomyopathy    Left ventricular apical thrombus    Mixed hyperlipidemia    Myocardial infarction [facility]) 1980   Peripheral vascular disease (HCC)    Polio osteopathy of lower leg (HCC) Age 10   Affected right leg   Skin cancer    TIA (transient ischemic attack) 1989   Chronic coumadin   UTI (lower urinary tract infection)     Medications:  Warfarin 8 mg daily prior to admission (last PTA dose 8/8)  Assessment: Patient is an 86 y/o M with medical history as above and including CAD s/p CABG, PVD, LV thrombus, TIA / CVA who was BIBEMS from [facility] for unresponsiveness and respiratory failure. Troponin elevated to 449. Pharmacy has been consulted to initiate heparin infusion for suspected ACS.  Baseline H&H, platelets acceptable. Baseline INR 1.4 (on warfarin as outpatient), aPTT 35.   Goal of Therapy:   Heparin level 0.3-0.7 units/ml Monitor platelets by anticoagulation protocol: Yes  0809 2136 HL 0.41, therapeutic; 1100 units/hr 0810 0631 HL 0.23, subtherapeutic; 1100 units/hr   Plan:  --Heparin level is subtherapeutic --Heparin 1300 unit IV bolus and increase heparin infusion to 1250 units/hr --Will recheck HL 8 hours after rate change --Daily CBC per protocol while on IV heparin; H&H and platelets are down-trending today. Monitor closely for s/sx of bleeding --Note INR trending up (1.4 >> 1.9). Continue to follow INR. May be at increased risk of bleeding given residual warfarin effect  Benita Gutter  11/09/2020 10:24 AM

## 2020-11-09 NOTE — ED Notes (Signed)
Lab at the bedside 

## 2020-11-09 NOTE — Plan of Care (Signed)

## 2020-11-09 NOTE — Progress Notes (Signed)
PROGRESS NOTE  Benjamin Cordova  DOB: 27-Nov-1934  PCP: Pablo Lawrence, NP [id number]  DOA: 11/08/2020  LOS: 1 day  Hospital Day: 2   Chief Complaint  Patient presents with   Respiratory Distress   Brief narrative: Benjamin Cordova is a 86 y.o. male with PMH significant for HTN, stroke, PAD, CAD/CABG, ischemic cardiomyopathy, paroxysmal A. fib, history of LV mural thrombus. Patient was brought to ED from [place] on 8/9 for evaluation of altered mental status and hypoxia. Nursing home staff found the patient unresponsive with a oxygen saturation of 85% on room air.  He was placed on 15 L of oxygen by nonrebreather mask with improvement in oxygen saturation and sent to ED.  In the ED, blood gas showed an uncompensated respiratory acidosis with pH of 7.3, PCO2 elevated at 79 and hence patient was placed on BiPAP. Labs showed potassium elevated to 5.4, bicarbonate 33, BNP elevated to more than 1000, troponin elevated to 449>> 841, lactic acid elevated to 2.1>> 2.9, WBC count elevated to 17.4 Respiratory virus panel negative CT scan of head did not show any acute abnormality.  It showed chronic cortical/subcortical infarct within the right frontal, parietal and temporal lobes. Chest x-ray showed bilateral small to moderate pleural effusion.  It also showed additional patchy ill-defined opacities in bilateral lower lung fields. Patient was admitted to hospitalist service for further evaluation management   Subjective: Patient was seen and examined this morning.  He was on BiPAP.  Alert, awake.  Switched from BiPAP to oxygen by nasal cannula with good maintenance of O2 sat.  Alert, awake, oriented x3 at the time of my evaluation.  No chest pain.  Assessment/Plan: Acute on chronic respiratory failure with hypoxia and hypercapnia -Brought from nursing facility after he was noted to be unresponsive with O2 sat low at 55%. -Probably related to acute exacerbation of CHF. -Initially  required BiPAP.  Currently on oxygen by nasal cannula.  Wean down as tolerated.  Acute on chronic systolic heart failure -Echo from 10/07/2020 showed EF of 20%, severe hypokinesis and severe dilation of the LV.   -Home meds include metoprolol succinate 50 mg daily, Lasix 40 mg daily -Currently on Lasix IV 40 mg daily.  Resume metoprolol. -Net IO Since Admission: -602.38 mL [11/09/20 1440] -Continue to monitor for daily intake output, weight, blood pressure, BNP, renal function and electrolytes. Recent Labs  Lab 11/08/20 0935 11/09/20 0631  BNP 1,087.0*  --   BUN 35* 41*  CREATININE 0.89 0.89  K 5.4* 4.7  MG 2.2  --    Hyperkalemia -Potassium level was elevated to 5.4 on admission.  Improved to normal range at 4.7 this morning.  Continue to monitor. Recent Labs  Lab 11/08/20 0935 11/09/20 0631  K 5.4* 4.7  MG 2.2  --    SIRS -POA Lactic acidosis -Presented with hypoxia, elevated WBC count, elevated lactic acid level. -However, no clear evidence of pneumonia in the imaging.  Procalcitonin level negative x2. -I will stop empiric antibiotics at this time.  Continue to monitor clinically. Recent Labs  Lab 11/08/20 0935 11/08/20 1120 11/08/20 1533 11/08/20 1634 11/09/20 0631  WBC 17.4*  --   --   --  9.2  LATICACIDVEN 2.1* 2.9* 2.6* 2.3*  --   PROCALCITON <0.10  --   --   --  <0.10   Elevated troponin History of CAD/CABG, PAD -EKG without any ST-T wave changes. -Troponin elevated, trend as below.  Initially placed on heparin drip -Cardiology consultation  was obtained.  In the absence of chest pain, shortness of breath, ischemic evaluation was not pursued.  -Continue simvastatin Recent Labs    11/08/20 1120 11/08/20 1533 11/08/20 1813  TROPONINIHS 841* 2,805* 3,275*    History of paroxysmal atrial fibrillation/LV thrombus -Patient was on Coumadin with subtherapeutic INR -Continue heparin drip until INR becomes therapeutic -Check daily PT/INR Recent Labs  Lab  11/08/20 0935 11/09/20 0631  INR 1.4* 1.9*   BPH -Continue finasteride and Flomax.  -Has a Foley catheter in place.  Per nursing staff he was placed on 7/24.  Acute metabolic encephalopathy -Altered mental status resolved.   -Alert, awake, oriented x3 this morning.  Mobility: Encourage ambulation Code Status:   Code Status: DNR  Nutritional status: There is no height or weight on file to calculate BMI.     Diet:  Diet Order             Diet Heart Room service appropriate? Yes; Fluid consistency: Thin  Diet effective now                  DVT prophylaxis: Heparin drip bridge with Coumadin    Antimicrobials: Okay to stop antibiotics Fluid: None Consultants: Cardiology Family Communication: None at bedside  Status is: Inpatient  Remains inpatient appropriate because: Needs IV diuresis  Dispo: The patient is from: [place] facility              Anticipated d/c is to: Back to nursing facility              Patient currently is not medically stable to d/c.   Difficult to place patient No     Infusions:   heparin 1,250 Units/hr (11/09/20 1034)    Scheduled Meds:  aspirin  300 mg Rectal Once   finasteride  5 mg Oral Daily   furosemide  40 mg Intravenous Daily   metoprolol succinate  50 mg Oral Daily   senna-docusate  1 tablet Oral QHS   simvastatin  40 mg Oral QHS   [START ON 11/10/2020] tamsulosin  0.4 mg Oral q AM    Antimicrobials: Anti-infectives (From admission, onward)    Start     Dose/Rate Route Frequency Ordered Stop   11/09/20 1600  vancomycin (VANCOREADY) IVPB 1500 mg/300 mL  Status:  Discontinued        1,500 mg 150 mL/hr over 120 Minutes Intravenous Every 24 hours 11/08/20 1446 11/09/20 1304   11/08/20 1600  ceFEPIme (MAXIPIME) 2 g in sodium chloride 0.9 % 100 mL IVPB  Status:  Discontinued        2 g 200 mL/hr over 30 Minutes Intravenous Every 8 hours 11/08/20 1403 11/09/20 1304   11/08/20 1400  vancomycin (VANCOCIN) IVPB  1000 mg/200 mL premix        1,000 mg 200 mL/hr over 60 Minutes Intravenous  Once 11/08/20 1357 11/08/20 1619   11/08/20 1400  ceFEPIme (MAXIPIME) 2 g in sodium chloride 0.9 % 100 mL IVPB  Status:  Discontinued        2 g 200 mL/hr over 30 Minutes Intravenous  Once 11/08/20 1357 11/08/20 1403   11/08/20 1000  ceFEPIme (MAXIPIME) 2 g in sodium chloride 0.9 % 100 mL IVPB        2 g 200 mL/hr over 30 Minutes Intravenous  Once 11/08/20 0957 11/08/20 1117   11/08/20 1000  vancomycin (VANCOCIN) IVPB 1000 mg/200 mL premix        1,000 mg 200 mL/hr over  60 Minutes Intravenous  Once 11/08/20 0957 11/08/20 1242       PRN meds: acetaminophen **OR** acetaminophen, ipratropium-albuterol, ondansetron **OR** ondansetron (ZOFRAN) IV   Objective: Vitals:   11/09/20 0958 11/09/20 1205  BP: 130/80 (!) 104/59  Pulse: 73 93  Resp: 19 18  Temp: 97.8 F (36.6 C) 99.5 F (37.5 C)  SpO2: 97% 98%    Intake/Output Summary (Last 24 hours) at 11/09/2020 1440 Last data filed at 11/09/2020 0913 Gross per 24 hour  Intake 609.83 ml  Output 1400 ml  Net -790.17 ml   There were no vitals filed for this visit. Weight change:  There is no height or weight on file to calculate BMI.   Physical Exam: General exam: Pleasant, elderly Caucasian male.  Not in physical distress Skin: No rashes, lesions or ulcers. HEENT: Atraumatic, normocephalic, no obvious bleeding Lungs: Clear to auscultation bilaterally CVS: Regular rate and rhythm, no murmur GI/Abd soft, nontender, nondistended, bowel sound present CNS: Alert, awake, oriented x3 Psychiatry: Mood appropriate Extremities: Trace bilateral pedal edema, no calf tenderness  Data Review: I have personally reviewed the laboratory data and studies available.  Recent Labs  Lab 11/08/20 0935 11/09/20 0631  WBC 17.4* 9.2  NEUTROABS 15.3*  --   HGB 12.0* 10.2*  HCT 38.1* 32.9*  MCV 99.2 98.5  PLT 300 174   Recent Labs  Lab 11/08/20 0935 11/09/20 0631   NA 140 142  K 5.4* 4.7  CL 95* 97*  CO2 33* 35*  GLUCOSE 150* 109*  BUN 35* 41*  CREATININE 0.89 0.89  CALCIUM 9.4 9.1  MG 2.2  --     F/u labs ordered Unresulted Labs (From admission, onward)     Start     Ordered   11/10/20 0500  Protime-INR  Daily,   R      11/09/20 1030   11/10/20 0500  CBC  Tomorrow morning,   R        11/09/20 1030   11/09/20 2000  Heparin level (unfractionated)  Once-Timed,   TIMED        11/09/20 1030   11/08/20 0952  Urine Culture  Add-on,   AD       Question:  Indication  Answer:  Sepsis   11/08/20 0951            Signed, Terrilee Croak, MD [facility] 11/09/2020

## 2020-11-09 NOTE — Consult Note (Signed)
[place] for IV heparin/warfarin Indication: chest pain/ACS; Atrial fibrillation  Patient Measurements: Heparin Dosing Weight: 91 kg  Labs: Recent Labs    11/08/20 0935 11/08/20 1120 11/08/20 1533 11/08/20 1813 11/08/20 2136 11/09/20 0631  HGB 12.0*  --   --   --   --  10.2*  HCT 38.1*  --   --   --   --  32.9*  PLT 300  --   --   --   --  174  APTT 35  --   --   --   --   --   LABPROT 17.0*  --   --   --   --  22.0*  INR 1.4*  --   --   --   --  1.9*  HEPARINUNFRC  --   --   --   --  0.41 0.23*  CREATININE 0.89  --   --   --   --  0.89  TROPONINIHS 449* 841* 2,805* 3,275*  --   --      Estimated Creatinine Clearance: 74.1 mL/min (by C-G formula based on SCr of 0.89 mg/dL).   Medical History: Past Medical History:  Diagnosis Date   Arthritis    Coronary artery disease    a. s/p CABG in 1981 b. cath in 10/2014 showing occluded grafts --> redo CABG in 2016 LIMA-LAD and Left Radial-OM2   Essential hypertension    Hematuria    History of blood transfusion 07/2015   Knee replacement HgB <9   History of pneumonia    History of stroke    Ischemic cardiomyopathy    Left ventricular apical thrombus    Mixed hyperlipidemia    Myocardial infarction [facility]) 1980   Peripheral vascular disease ([place])    Polio osteopathy of lower leg ([place]) Age 60   Affected right leg   Skin cancer    TIA (transient ischemic attack) 1989   Chronic coumadin   UTI (lower urinary tract infection)     Medications:  Warfarin 8 mg daily prior to admission (last PTA dose 8/8) Prior chronic regimen was 10 mg PO 6 days/week, and 5 mg daily on Fridays (TWD = 65 mg), during recent hospital admission 10/23/20 - 10/28/20, patient received 10 mg daily. Discharge orders to SNF where warfarin dose was decreased to 7.5 mg daily starting 10/29/2020 with discharge orders for INR check on 10/31/20. Dose was recently increased to 8 mg daily at SNF (TWD = 56 mg)     Assessment: Patient is an 86 y/o M with medical history as above and including CAD s/p CABG, PVD, LV thrombus, TIA / CVA who was BIBEMS from [facility] for unresponsiveness and respiratory failure. Troponin elevated to 449. Pharmacy has been consulted to initiate heparin infusion for suspected ACS.  Baseline H&H, platelets acceptable. Baseline INR 1.4 (on warfarin as outpatient), aPTT 35.   Warfarin: Patient on chronic warfarin therapy for paroxymal atrial fibrillation, recurrent TIAs, and LV thrombus. Prior chronic regimen was 10 mg PO 6 days/week, and 5 mg daily on Fridays (TWD = 65 mg), during recent hospital admission 10/23/20 - 10/28/20, patient received 10 mg daily. Discharge orders to SNF where warfarin dose was decreased to 7.5 mg daily starting 10/29/2020 with discharge orders for INR check on 10/31/20. Dose was recently increased to 8 mg daily at SNF (TWD = 56 mg). INR on admission 1.4 (subtherapeutic). Pharmacy has been consulted to restart warfarin therapy.   DD interactions: --  Cefepime IV 3 doses -- last dose 8/10 @ 0752 -- discontinued   Date INR Warfarin Dose  8/8 -- 8 mg (PTA) 8/9 1.4 Held -- IV heparin infusion for ACS started  8/10 1.9   Goal of Therapy:  INR 2-3  Heparin level 0.3-0.7 units/ml Monitor platelets by anticoagulation protocol: Yes  0809 2136 HL 0.41, therapeutic; 1100 units/hr 0810 0631 HL 0.23, subtherapeutic; 1100 units/hr   Plan:  Warfarin: INR 1.4 (subtherapeutic on admission) >> 1.9  Although subtherapeutic on admission, will give home dose of 8 mg x 1 tonight given recent abx exposure INR daily  Heparin: --Heparin level is subtherapeutic --Heparin 1300 unit IV bolus and increase heparin infusion to 1250 units/hr --Will recheck HL 8 hours after rate change --Daily CBC per protocol while on IV heparin; H&H and platelets are down-trending today. Monitor closely for s/sx of bleeding --Note INR trending up (1.4 >> 1.9). Continue to follow INR.  May be at increased risk of bleeding given residual warfarin effect  Dorothe Pea, PharmD, BCPS Clinical Pharmacist   11/09/2020 3:00 PM

## 2020-11-09 NOTE — Progress Notes (Addendum)
Progress Note  Patient Name: Benjamin Cordova Date of Encounter: 11/09/2020  [facility] Cardiologist: Rozann Lesches, MD   Subjective   No acute events overnight, getting antibiotics for pneumonia.  Breathing is better.    Inpatient Medications    Scheduled Meds:  aspirin  300 mg Rectal Once   finasteride  5 mg Oral Daily   furosemide  40 mg Intravenous Daily   metoprolol succinate  50 mg Oral Daily   senna-docusate  1 tablet Oral QHS   simvastatin  40 mg Oral QHS   [START ON 11/10/2020] tamsulosin  0.4 mg Oral q AM   [START ON 11/10/2020] warfarin  8 mg Oral ONCE-1600   [START ON 11/10/2020] Warfarin - Pharmacist Dosing Inpatient   Does not apply q1600   Continuous Infusions:  heparin 1,250 Units/hr (11/09/20 1034)   PRN Meds: acetaminophen **OR** acetaminophen, ipratropium-albuterol, ondansetron **OR** ondansetron (ZOFRAN) IV   Vital Signs    Vitals:   11/09/20 0900 11/09/20 0958 11/09/20 1205 11/09/20 1603  BP: 113/72 130/80 (!) 104/59 (!) 114/57  Pulse: (!) 107 73 93 98  Resp: '12 19 18 20  '$ Temp:  97.8 F (36.6 C) 99.5 F (37.5 C) 98.2 F (36.8 C)  TempSrc:      SpO2: 99% 97% 98% 97%    Intake/Output Summary (Last 24 hours) at 11/09/2020 1806 Last data filed at 11/09/2020 1600 Gross per 24 hour  Intake 1009.83 ml  Output 1300 ml  Net -290.17 ml   Last 3 Weights 10/28/2020 10/27/2020 10/26/2020  Weight (lbs) 232 lb 12.9 oz 229 lb 15 oz 235 lb 0.2 oz  Weight (kg) 105.6 kg 104.3 kg 106.6 kg      Telemetry    Sinus rhythm heart rate 89- Personally Reviewed  ECG     - Personally Reviewed  Physical Exam   GEN: No acute distress.  Appears frail, soft-spoken Neck: No JVD Cardiac: RRR Respiratory: Clear to auscultation bilaterally. GI: Soft, nontender, non-distended  MS: No edema; No deformity. Neuro:  Nonfocal  Psych: Normal affect   Labs    High Sensitivity Troponin:   Recent Labs  Lab 10/24/20 0712 11/08/20 0935 11/08/20 1120  11/08/20 1533 11/08/20 1813  TROPONINIHS 56* 449* 841* 2,805* 3,275*      Chemistry Recent Labs  Lab 11/08/20 0935 11/09/20 0631  NA 140 142  K 5.4* 4.7  CL 95* 97*  CO2 33* 35*  GLUCOSE 150* 109*  BUN 35* 41*  CREATININE 0.89 0.89  CALCIUM 9.4 9.1  PROT 7.6  --   ALBUMIN 3.8  --   AST 46*  --   ALT 31  --   ALKPHOS 67  --   BILITOT 0.6  --   GFRNONAA >60 >60  ANIONGAP 12 10     Hematology Recent Labs  Lab 11/08/20 0935 11/09/20 0631  WBC 17.4* 9.2  RBC 3.84* 3.34*  HGB 12.0* 10.2*  HCT 38.1* 32.9*  MCV 99.2 98.5  MCH 31.3 30.5  MCHC 31.5 31.0  RDW 13.2 13.3  PLT 300 174    BNP Recent Labs  Lab 11/08/20 0935  BNP 1,087.0*     DDimer No results for input(s): DDIMER in the last 168 hours.   Radiology    CT HEAD WO CONTRAST (5MM)  Result Date: 11/08/2020 CLINICAL DATA:  Mental status change, unknown cause. EXAM: CT HEAD WITHOUT CONTRAST TECHNIQUE: Contiguous axial images were obtained from the base of the skull through the vertex without intravenous contrast. COMPARISON:  Prior head CT examinations 02/03/2019 and earlier. FINDINGS: Brain: Redemonstrated chronic cortical/subcortical infarct within the right frontal, parietal and temporal lobes. Associated ex vacuo dilatation of the right lateral ventricle. Background cerebral volume is normal for age. There is no acute intracranial hemorrhage. No acute demarcated cortical infarct. No extra-axial fluid collection. No evidence of an intracranial mass. No midline shift. Vascular: No hyperdense vessel.  Atherosclerotic calcifications. Skull: Normal. Negative for fracture or focal lesion. Sinuses/Orbits: Visualized orbits show no acute finding. Left maxillary sinus mucous retention cysts measuring up to 13 mm. Trace mucosal thickening within the right maxillary sinus. Trace bilateral ethmoid sinus mucosal thickening. Redemonstrated small foci of polypoid soft tissue within the right nasal passage. IMPRESSION: No  evidence of acute intracranial abnormality. Redemonstrated chronic cortical/subcortical infarct within the right frontal, parietal and temporal lobes. Paranasal sinus disease, as described. Electronically Signed   By: Kellie Simmering DO   On: 11/08/2020 10:28   CT Angio Chest PE W and/or Wo Contrast  Result Date: 11/08/2020 CLINICAL DATA:  PE suspected, high prob.  Hypoxia. EXAM: CT ANGIOGRAPHY CHEST WITH CONTRAST TECHNIQUE: Multidetector CT imaging of the chest was performed using the standard protocol during bolus administration of intravenous contrast. Multiplanar CT image reconstructions and MIPs were obtained to evaluate the vascular anatomy. CONTRAST:  60m OMNIPAQUE IOHEXOL 350 MG/ML SOLN COMPARISON:  09/15/2019 FINDINGS: Cardiovascular: Pulmonary arterial opacification is adequate without evidence of emboli allowing for motion artifact which intermittently limits assessment of segmental and subsegmental vessels. There is moderate cardiomegaly including prominent left ventricular dilatation. Calcification is noted at the left ventricular apex consistent with previous myocardial infarction. There is no pericardial effusion. Three-vessel coronary atherosclerosis is noted. There is thoracic aortic atherosclerosis without aneurysm. Prior CABG. Mediastinum/Nodes: No enlarged axillary, mediastinal, or hilar lymph nodes. Unremarkable thyroid and esophagus. Lungs/Pleura: Moderate respiratory motion artifact. Moderate right and small left pleural effusions which are partially loculated and extend into the fissures. Associated compressive atelectasis greatest in the right greater than left lower lobes. Chronic scarring or atelectasis in the left upper lobe. Upper Abdomen: No acute finding. Musculoskeletal: Mildly displaced right lateral sixth, seventh, and eighth rib fractures with early callus formation. Review of the MIP images confirms the above findings. IMPRESSION: 1. No evidence of pulmonary emboli. 2. Moderate  right and small left pleural effusions and atelectasis. 3. Subacute right rib fractures. 4. Aortic Atherosclerosis (ICD10-I70.0). Electronically Signed   By: ALogan BoresM.D.   On: 11/08/2020 14:01   [place]Venous Img Lower Unilateral Left (DVT)  Result Date: 11/08/2020 CLINICAL DATA:  86year old male with history of DVT, shortness of breath. EXAM: LEFT LOWER EXTREMITY VENOUS DOPPLER ULTRASOUND TECHNIQUE: Gray-scale sonography with graded compression, as well as color Doppler and duplex ultrasound were performed to evaluate the left lower extremity deep venous systems from the level of the common femoral vein and including the common femoral, femoral, profunda femoral, popliteal and calf veins including the posterior tibial, peroneal and gastrocnemius veins when visible. Spectral Doppler was utilized to evaluate flow at rest and with distal augmentation maneuvers in the common femoral, femoral and popliteal veins. The contralateral common femoral vein was also evaluated for comparison. COMPARISON:  None. FINDINGS: LEFT LOWER EXTREMITY Common Femoral Vein: No evidence of thrombus. Normal compressibility, respiratory phasicity and response to augmentation. Central Greater Saphenous Vein: No evidence of thrombus. Normal compressibility and flow on color Doppler imaging. Central Profunda Femoral Vein: No evidence of thrombus. Normal compressibility and flow on color Doppler imaging. Femoral Vein: No evidence of thrombus.  Normal compressibility, respiratory phasicity and response to augmentation. Popliteal Vein: No evidence of thrombus. Normal compressibility, respiratory phasicity and response to augmentation. Calf Veins: No evidence of thrombus. Normal compressibility and flow on color Doppler imaging. Other Findings:  None. RIGHT LOWER EXTREMITY Common Femoral Vein: No evidence of thrombus. Normal compressibility, respiratory phasicity and response to augmentation. IMPRESSION: No evidence of left lower extremity deep  venous thrombosis. Ruthann Cancer, MD [facility] Electronically Signed   By: Ruthann Cancer MD   On: 11/08/2020 15:49   DG Chest Port 1 View  Result Date: 11/08/2020 CLINICAL DATA:  Questionable sepsis - evaluate for abnormality. EXAM: PORTABLE CHEST 1 VIEW COMPARISON:  Prior chest radiographs 10/25/2020 and earlier. CT angiogram chest 09/15/2019 FINDINGS: Prior median sternotomy. Unchanged cardiomegaly. Small to moderate bilateral pleural effusions. Redemonstrated scarring within the left mid lung. Additional patchy and ill-defined opacities within the bilateral mid to lower lung fields may reflect atelectasis, edema or pneumonia. No evidence of pneumothorax. No acute bony abnormality identified. IMPRESSION: Small-to-moderate bilateral pleural effusions. Additional patchy and ill-defined opacities within the bilateral mid-to-lower lung fields, which may reflect atelectasis, edema or pneumonia. Redemonstrated scarring within the left mid lung. Unchanged cardiomegaly. Electronically Signed   By: Kellie Simmering DO   On: 11/08/2020 10:32    Cardiac Studies   TTEcho 10/07/2020 1. LVEF is severely depressed with severe hypokineis of the mid/distal  ventricle; distal anterior, dsital inferior and apical akinesis. Note apex  is not well seen in this bedside study COmpared to 2021 no significant  change . Left ventricular ejection  fraction, by estimation, is 20%%. The left ventricular internal cavity  size was severely dilated. Left ventricular diastolic parameters are  indeterminate.   2. Right ventricular systolic function is normal. The right ventricular  size is normal. There is normal pulmonary artery systolic pressure.   3. Trivial mitral valve regurgitation.   4. The aortic valve is tricuspid. Aortic valve regurgitation is not  visualized. Mild aortic valve sclerosis is present, with no evidence of  aortic valve stenosis.   Patient Profile      86 y.o. male with history of CAD/CABG, redo CABG, ischemic cardiomyopathy last EF 20%, LV thrombus presenting with cough and shortness of breath, diagnosed with pneumonia.  Being seen for cardiomyopathy/CHF.  Assessment & Plan    Ischemic cardiomyopathy, EF 20% -Shortness of breath improving -Continue gentle diuresing, IV Lasix 40 mg daily.  Net -700 cc -Restart PTA Toprol-XL, Aldactone. -Monitor blood pressure, if permits, consider adding ACE/ARB's.  2.  NSTEMI, CAD/CABG -Frailty, comorbidities, not candidate for left heart cath -On heparin drip.  Continue for 48 hours, through tomorrow. -Coumadin, statin.  3.  History of LV thrombus -PTA Coumadin per pharmacy -INR 1.9.  4.  Pneumonia -Antibiotics as per primary team  Overall patient is frail, soft-spoken, frequent hospitalizations for CHF over the past year.  Recommend goals of care discussions/palliative care input.  Total encounter time 35 minutes  Greater than 50% was spent in counseling and coordination of care with the patient     Signed, Kate Sable, MD  11/09/2020, 6:06 PM

## 2020-11-10 ENCOUNTER — Inpatient Hospital Stay: Payer: Medicare HMO

## 2020-11-10 DIAGNOSIS — I5043 Acute on chronic combined systolic (congestive) and diastolic (congestive) heart failure: Secondary | ICD-10-CM

## 2020-11-10 DIAGNOSIS — J9601 Acute respiratory failure with hypoxia: Secondary | ICD-10-CM

## 2020-11-10 DIAGNOSIS — I5023 Acute on chronic systolic (congestive) heart failure: Secondary | ICD-10-CM | POA: Diagnosis not present

## 2020-11-10 LAB — BASIC METABOLIC PANEL
Anion gap: 4 — ABNORMAL LOW (ref 5–15)
BUN: 42 mg/dL — ABNORMAL HIGH (ref 8–23)
CO2: 40 mmol/L — ABNORMAL HIGH (ref 22–32)
Calcium: 8.9 mg/dL (ref 8.9–10.3)
Chloride: 98 mmol/L (ref 98–111)
Creatinine, Ser: 0.97 mg/dL (ref 0.61–1.24)
GFR, Estimated: >60 mL/min (ref >60)
Glucose, Bld: 132 mg/dL — ABNORMAL HIGH (ref 70–99)
Potassium: 4.3 mmol/L (ref 3.5–5.1)
Sodium: 142 mmol/L (ref 135–145)

## 2020-11-10 LAB — CBC
HCT: 33.6 % — ABNORMAL LOW (ref 39.0–52.0)
Hemoglobin: 10.6 g/dL — ABNORMAL LOW (ref 13.0–17.0)
MCH: 31.5 pg (ref 26.0–34.0)
MCHC: 31.5 g/dL (ref 30.0–36.0)
MCV: 99.7 fL (ref 80.0–100.0)
Platelets: 185 10*3/uL (ref 150–400)
RBC: 3.37 MIL/uL — ABNORMAL LOW (ref 4.22–5.81)
RDW: 13.6 % (ref 11.5–15.5)
WBC: 8.3 10*3/uL (ref 4.0–10.5)
nRBC: 0.2 % (ref 0.0–0.2)

## 2020-11-10 LAB — PHOSPHORUS: Phosphorus: 3.8 mg/dL (ref 2.5–4.6)

## 2020-11-10 LAB — BLOOD GAS, ARTERIAL
Acid-Base Excess: 15.6 mmol/L — ABNORMAL HIGH (ref 0.0–2.0)
Bicarbonate: 46 mmol/L — ABNORMAL HIGH (ref 20.0–28.0)
O2 Saturation: 99.7 %
Patient temperature: 37
pCO2 arterial: 98 mmHg (ref 32.0–48.0)
pH, Arterial: 7.28 — ABNORMAL LOW (ref 7.350–7.450)
pO2, Arterial: 218 mmHg — ABNORMAL HIGH (ref 83.0–108.0)

## 2020-11-10 LAB — MAGNESIUM: Magnesium: 2.5 mg/dL — ABNORMAL HIGH (ref 1.7–2.4)

## 2020-11-10 LAB — TROPONIN I (HIGH SENSITIVITY): Troponin I (High Sensitivity): 861 ng/L (ref <18)

## 2020-11-10 LAB — PROTIME-INR
INR: 1.6 — ABNORMAL HIGH (ref 0.8–1.2)
Prothrombin Time: 18.9 seconds — ABNORMAL HIGH (ref 11.4–15.2)

## 2020-11-10 LAB — HEPARIN LEVEL (UNFRACTIONATED): Heparin Unfractionated: 0.32 IU/mL (ref 0.30–0.70)

## 2020-11-10 MED ORDER — POLYETHYLENE GLYCOL 3350 17 G PO PACK
17.0000 g | PACK | Freq: Every day | ORAL | Status: DC | PRN
  Administered 2020-11-10 – 2020-11-14 (×4): 17 g via ORAL
  Filled 2020-11-10 (×4): qty 1

## 2020-11-10 MED ORDER — WARFARIN SODIUM 6 MG PO TABS
12.0000 mg | ORAL_TABLET | Freq: Once | ORAL | Status: AC
  Administered 2020-11-10: 12 mg via ORAL
  Filled 2020-11-10: qty 2

## 2020-11-10 MED ORDER — FUROSEMIDE 10 MG/ML IJ SOLN
40.0000 mg | Freq: Two times a day (BID) | INTRAMUSCULAR | Status: DC
  Administered 2020-11-10 – 2020-11-13 (×6): 40 mg via INTRAVENOUS
  Filled 2020-11-10 (×6): qty 4

## 2020-11-10 MED ORDER — GUAIFENESIN ER 600 MG PO TB12
600.0000 mg | ORAL_TABLET | Freq: Two times a day (BID) | ORAL | Status: DC
  Administered 2020-11-11 – 2020-11-14 (×7): 600 mg via ORAL
  Filled 2020-11-10 (×8): qty 1

## 2020-11-10 NOTE — Progress Notes (Signed)
Critical care note:  Date of note: 11/10/2020  Subjective: The patient has been fairly somnolent this evening with decreased responsiveness and shallow breathing.  No cough or wheezing.  No nausea or vomiting.  No chest pain.  No fever or chills.  He has been maintaining pulse symmetry of 100% on 3 L of O2 via nasal cannula.  Notes and labs were reviewed.  Objective: Physical examination: Generally: Acutely ill, fairly somnolent and difficult to arouse elderly Caucasian male with mild to moderate respiratory distress.   Vital signs reveal a blood pressure 120/72 temperature 96.2, heart rate 102 and respiratory to of 26 with pulse 71% on 3 L O2 by nasal cannula. Head - atraumatic, normocephalic.  Pupils - equal, round and reactive to light and accommodation. Extraocular movements are intact. No scleral icterus.  Oropharynx - moist mucous membranes and tongue. No pharyngeal erythema or exudate.  Neck - supple. No JVD. Carotid pulses 2+ bilaterally. No carotid bruits. No palpable thyromegaly or lymphadenopathy. Cardiovascular - regular rate and rhythm. Normal S1 and S2. No murmurs, gallops or rubs.  Lungs -diminished bibasilar breath sounds. Abdomen - soft and nontender. Positive bowel sounds. No palpable organomegaly or masses.  Extremities -trace bilateral lower extremity pitting edema, with no clubbing or cyanosis.  Neuro - grossly non-focal. Skin - no rashes. GU and rectal exam - deferred.   Stat ABG I ordered showed pH 7.28, PCO2 of 98, PO2 of 218, HCO3 of 46 and O2 sat of 99.7% on 3 L of O2 by nasal cannula.  Earlier high-sensitivity troponin I was 861.  Stat portable chest x-ray ordered and reviewed revealed cardiomegaly with vascular congestion and probable pulmonary edema with increasing moderate bilateral pleural effusions with worsening basilar airspace disease.   Assessment/plan: 1.  Acute hypoxic and hypercarbic respiratory failure secondary to acute on chronic systolic and  diastolic CHF. - The patient was placed on BiPAP and will follow ABG. - O2 protocol will be followed. - He was given diuresis with IV Lasix and will monitor urine output. - We will continue his Aldactone and Toprol-XL.  2.  None-STEMI. - Patient is on IV heparin which will be continued. - Cardiology follow-up.  Will continue other current plan of care.  I attempted calling the patient's contact number including his son's number with no answer.  I left his son a Message to call me back to discuss current deterioration.  The patient is currently DNR.  Authorized and performed by: Eugenie Norrie, MD Total critical care time: Approximately 35     minutes. Due to a high probability of clinically significant, life-threatening deterioration, the patient required my highest level of preparedness to intervene emergently and I personally spent this critical care time directly and personally managing the patient.  This critical care time included obtaining a history, examining the patient, pulse oximetry, ordering and review of studies, arranging urgent treatment with development of management plan, evaluation of patient's response to treatment, frequent reassessment, and discussions with other providers. This critical care time was performed to assess and manage the high probability of imminent, life-threatening deterioration that could result in multiorgan failure.  It was exclusive of separately billable procedures and treating other patients and teaching time.  Please see MDM section and the rest of the note for further information on patient assessment and treatment.

## 2020-11-10 NOTE — Progress Notes (Signed)
Progress Note  Patient Name: Benjamin Cordova Date of Encounter: 11/10/2020  Primary Cardiologist: Rozann Lesches, MD   Subjective   Pt receiving bath at time of rounds. Improved breathing status. No CP.  Inpatient Medications    Scheduled Meds:  Chlorhexidine Gluconate Cloth  6 each Topical Daily   finasteride  5 mg Oral Daily   furosemide  40 mg Intravenous Daily   metoprolol succinate  50 mg Oral Daily   senna-docusate  1 tablet Oral QHS   simvastatin  40 mg Oral QHS   spironolactone  12.5 mg Oral Daily   tamsulosin  0.4 mg Oral q AM   warfarin  12 mg Oral ONCE-1600   Warfarin - Pharmacist Dosing Inpatient   Does not apply q1600   Continuous Infusions:  heparin 1,250 Units/hr (11/10/20 0303)   PRN Meds: acetaminophen **OR** acetaminophen, ipratropium-albuterol, ondansetron **OR** ondansetron (ZOFRAN) IV   Vital Signs    Vitals:   11/10/20 0331 11/10/20 0725 11/10/20 0937 11/10/20 1125  BP: 129/67 102/66  (!) 141/67  Pulse: (!) 102 100  90  Resp: '17 18  18  '$ Temp: 97.6 F (36.4 C) (!) 97.4 F (36.3 C)  (!) 97.5 F (36.4 C)  TempSrc:    Oral  SpO2: 100% 95%  94%  Weight:   100.4 kg     Intake/Output Summary (Last 24 hours) at 11/10/2020 1306 Last data filed at 11/10/2020 0345 Gross per 24 hour  Intake 1581.61 ml  Output 2800 ml  Net -1218.39 ml   Last 3 Weights 11/10/2020 10/28/2020 10/27/2020  Weight (lbs) 221 lb 6.4 oz 232 lb 12.9 oz 229 lb 15 oz  Weight (kg) 100.426 kg 105.6 kg 104.3 kg      Telemetry    SR-ST, PVCs, NSVT, AT/SVT - Personally Reviewed  ECG    No new tracings - Personally Reviewed  Physical Exam   GEN: No acute distress.  Receiving bath. Neck: No JVD Cardiac: RRR, no murmurs, rubs, or gallops.  Respiratory: Coarse breath sounds bilaterally. GI: Soft, nontender, non-distended  MS: No significant pitting edema; No deformity. Neuro:  Nonfocal  Psych: Normal affect   Labs    High Sensitivity Troponin:   Recent Labs  Lab  10/24/20 0712 11/08/20 0935 11/08/20 1120 11/08/20 1533 11/08/20 1813  TROPONINIHS 56* 449* 841* 2,805* 3,275*      Chemistry Recent Labs  Lab 11/08/20 0935 11/09/20 0631 11/10/20 0528  NA 140 142 142  K 5.4* 4.7 4.3  CL 95* 97* 98  CO2 33* 35* 40*  GLUCOSE 150* 109* 132*  BUN 35* 41* 42*  CREATININE 0.89 0.89 0.97  CALCIUM 9.4 9.1 8.9  PROT 7.6  --   --   ALBUMIN 3.8  --   --   AST 46*  --   --   ALT 31  --   --   ALKPHOS 67  --   --   BILITOT 0.6  --   --   GFRNONAA >60 >60 >60  ANIONGAP 12 10 4*     Hematology Recent Labs  Lab 11/08/20 0935 11/09/20 0631 11/10/20 0528  WBC 17.4* 9.2 8.3  RBC 3.84* 3.34* 3.37*  HGB 12.0* 10.2* 10.6*  HCT 38.1* 32.9* 33.6*  MCV 99.2 98.5 99.7  MCH 31.3 30.5 31.5  MCHC 31.5 31.0 31.5  RDW 13.2 13.3 13.6  PLT 300 174 185    BNP Recent Labs  Lab 11/08/20 0935  BNP 1,087.0*     DDimer No  results for input(s): DDIMER in the last 168 hours.   Radiology    CT Angio Chest PE W and/or Wo Contrast  Result Date: 11/08/2020 CLINICAL DATA:  PE suspected, high prob.  Hypoxia. EXAM: CT ANGIOGRAPHY CHEST WITH CONTRAST TECHNIQUE: Multidetector CT imaging of the chest was performed using the standard protocol during bolus administration of intravenous contrast. Multiplanar CT image reconstructions and MIPs were obtained to evaluate the vascular anatomy. CONTRAST:  96m OMNIPAQUE IOHEXOL 350 MG/ML SOLN COMPARISON:  09/15/2019 FINDINGS: Cardiovascular: Pulmonary arterial opacification is adequate without evidence of emboli allowing for motion artifact which intermittently limits assessment of segmental and subsegmental vessels. There is moderate cardiomegaly including prominent left ventricular dilatation. Calcification is noted at the left ventricular apex consistent with previous myocardial infarction. There is no pericardial effusion. Three-vessel coronary atherosclerosis is noted. There is thoracic aortic atherosclerosis without  aneurysm. Prior CABG. Mediastinum/Nodes: No enlarged axillary, mediastinal, or hilar lymph nodes. Unremarkable thyroid and esophagus. Lungs/Pleura: Moderate respiratory motion artifact. Moderate right and small left pleural effusions which are partially loculated and extend into the fissures. Associated compressive atelectasis greatest in the right greater than left lower lobes. Chronic scarring or atelectasis in the left upper lobe. Upper Abdomen: No acute finding. Musculoskeletal: Mildly displaced right lateral sixth, seventh, and eighth rib fractures with early callus formation. Review of the MIP images confirms the above findings. IMPRESSION: 1. No evidence of pulmonary emboli. 2. Moderate right and small left pleural effusions and atelectasis. 3. Subacute right rib fractures. 4. Aortic Atherosclerosis (ICD10-I70.0). Electronically Signed   By: ALogan BoresM.D.   On: 11/08/2020 14:01   UK[place]Venous Img Lower Unilateral Left (DVT)  Result Date: 11/08/2020 CLINICAL DATA:  86year old male with history of DVT, shortness of breath. EXAM: LEFT LOWER EXTREMITY VENOUS DOPPLER ULTRASOUND TECHNIQUE: Gray-scale sonography with graded compression, as well as color Doppler and duplex ultrasound were performed to evaluate the left lower extremity deep venous systems from the level of the common femoral vein and including the common femoral, femoral, profunda femoral, popliteal and calf veins including the posterior tibial, peroneal and gastrocnemius veins when visible. Spectral Doppler was utilized to evaluate flow at rest and with distal augmentation maneuvers in the common femoral, femoral and popliteal veins. The contralateral common femoral vein was also evaluated for comparison. COMPARISON:  None. FINDINGS: LEFT LOWER EXTREMITY Common Femoral Vein: No evidence of thrombus. Normal compressibility, respiratory phasicity and response to augmentation. Central Greater Saphenous Vein: No evidence of thrombus. Normal  compressibility and flow on color Doppler imaging. Central Profunda Femoral Vein: No evidence of thrombus. Normal compressibility and flow on color Doppler imaging. Femoral Vein: No evidence of thrombus. Normal compressibility, respiratory phasicity and response to augmentation. Popliteal Vein: No evidence of thrombus. Normal compressibility, respiratory phasicity and response to augmentation. Calf Veins: No evidence of thrombus. Normal compressibility and flow on color Doppler imaging. Other Findings:  None. RIGHT LOWER EXTREMITY Common Femoral Vein: No evidence of thrombus. Normal compressibility, respiratory phasicity and response to augmentation. IMPRESSION: No evidence of left lower extremity deep venous thrombosis. DRuthann Cancer MD [facility] Electronically Signed   By: DRuthann CancerMD   On: 11/08/2020 15:49    Cardiac Studies   Echo 10/07/2020 1. LVEF is severely depressed with severe hypokineis of the mid/distal  ventricle; distal anterior, dsital inferior and apical akinesis. Note apex  is not well seen in this bedside study COmpared to 2021 no significant  change . Left ventricular ejection  fraction,  by estimation, is 20%%. The left ventricular internal cavity  size was severely dilated. Left ventricular diastolic parameters are  indeterminate.   2. Right ventricular systolic function is normal. The right ventricular  size is normal. There is normal pulmonary artery systolic pressure.   3. Trivial mitral valve regurgitation.   4. The aortic valve is tricuspid. Aortic valve regurgitation is not  visualized. Mild aortic valve sclerosis is present, with no evidence of  aortic valve stenosis.     MPI 2017   IMPRESSION: 1. Large fixed defect at the apex extending into the anterior and inferior walls. Concern for peri-infarct ischemia in the anterior wall and lower septum.   2. Severe diffuse hypokinesia with akinesia in the  areas of scar.   3. Left ventricular ejection fraction 26%   4. Non invasive risk stratification*: High risk   *2012 Appropriate Use Criteria for Coronary Revascularization Focused Update: J Am Coll Cardiol. [id number]. http://content.airportbarriers.com.aspx?articleid=1201161     Electronically Signed   By: Rolm Baptise M.D.   On: 09/03/2015 13:09     Cardiac cath 2016 Prox LAD lesion, 80% stenosed. Mid LAD lesion, 100% stenosed. 1st Diag-1 lesion, 90% stenosed. 1st Diag-2 lesion, 100% stenosed. 1st Mrg lesion, 100% stenosed. 2nd Mrg lesion, 90% stenosed. Prox RCA to Mid RCA lesion, 90% stenosed. Mid RCA to Dist RCA lesion, 100% stenosed. Acute Mrg lesion, 90% stenosed. SVG . Origin lesion, before 1st Diag, 100% stenosed. Origin lesion, 100% stenosed. Origin lesion, 100% stenosed. There is moderate to severe left ventricular systolic dysfunction.   1. Severe 3 vessel obstructive/occlusive CAD. 2. All grafts are occluded including SVG to diagonal/LAD, SVG to OM1, and SVG to RCA. 3. Moderate to severe LV dysfunction. 4. The LIMA is widely patent as was not used previously for bypass.   Recommendation: consider for redo CABG  Patient Profile     86 y.o. male with history of CAD/CABG, redo CABG 2016, chronic HFrEF due to ischemic cardiomyopathy complicated by LV thrombus on PTA Coumadin with EF 20% by echo 09/2020, HTN, HLD, and presenting with cough and shortness of breath with pna / sepsis diagnosed and cardiology consulted for elevated HS Tn and AOC HFrEF at presentation.  Assessment & Plan    Acute on chronic HFrEF --Improving breathing status after AOC HFrEF and pna. BNP over 1000.00 at presentation and CT with b/l pleural effusions. Echo 09/2020 with EF less than 20%. Currently on [place] oxygen at 2-3L. --Daily BMET.  Most recent Cr stable, electrolytes at goal. --Continue IV lasix '40mg'$  qd. Monitor I/Os, daily wts.  Net -1.12L yesterday; net -1.820.8 L for  the admission. Wt not monitored well. 100.4kg today. No other wt recorded. Will need transitioned back to oral lasix before discharge. For now, recommend continue IV diuresis and titrate as tolerated by BP/Cr for ongoing output. --Escalation of GDMT as BP/Cr allows. Continue lasix, Toprol,  spironolactone.  Potassium wnl on spironolactone.  BP tolerating restart of Toprol and spironolactone. Consider Entresto 24-'26mg'$  BID before discharge if stable Cr/K and ongoing room in BP. Will defer for now given some soft BP and given history of hypotension in the past with Entresto. If unable to start Entresto, could instead start Losartan '25mg'$  if room in BP and stable Cr/K and try for later transition from ARB to [facility] in the OP stetting. SGLT2i can be added now or in the OP setting, if tolerated.  --Follow-up in the office recommended with repeat BMET at that time.   NSTEMI History of CAD  s/p CABG 1981 / redo CABG 2016 --No CP. HS Tn trended to 3275.0 but not cycled until down-trending. Will recheck HS Tn to ensure down-trended. EKG with non-specific findings. History of CAD and CABG as outlined in consult note. Recommendation was for 48h IV heparin in this setting, which will continue until later today with subsequent restart of Coumadin at that time. No plan for further ischemic workup at this time. Not an ideal candidate for catheterization or invasive procedures at this time given frailty and comorbids and respiratory status and high risk for decompensation. He also has refused catheterization several times in the recent past. Continue medical management with BB and statin. RF modification. Not on ASA given need for anticoagulation.  History of LV thrombus on PTA Coumadin --Following IV heparin, restart and titration of Coumadin per pharmacy. INR 1.6. IV heparin should be at 48h by today per review of EMR.  Essential hypertension --Most recent SBP 140s. Consider Entresto before discharge as above.  Escalate GDMT as Cr/K and BP allows. If unable to tolerate Entresto, could try start of low dose ARB. For now, continue IV diuresis, BB, and spironolactone as tolerated. Hypotension has precluded escalation of GDMT in the past.   HLD --Continue statin  Pna/sepsis --Abx per IM.   For questions or updates, please contact [place] Please consult [email or web address] for contact info under        Signed, Arvil Chaco, PA-C  11/10/2020, 1:06 PM

## 2020-11-10 NOTE — TOC Initial Note (Signed)
Transition of Care [facility]) - Initial/Assessment Note    Patient Details  Name: Benjamin Cordova MRN: [id number] Date of Birth: Nov 26, 1934  Transition of [facility]) CM/SW Contact:    Beverly Sessions, RN Phone Number: 11/10/2020, 4:41 PM  Clinical Narrative:                  Patient admitted from [place] at the bedside  PT recommending SNF. Patient wishes that he could return home, but is realistic that he will need to return to SNF.  Patient agreeable to return to [facility] at discharge, son [name] in agreement  TOC to complete fl2, and will request insurance auth.    Expected Discharge Plan: Skilled Nursing Facility Barriers to Discharge: Continued Medical Work up   Patient Goals and CMS Choice        Expected Discharge Plan and Services Expected Discharge Plan: [place] arrangements for the past 2 months: [place]                                      Prior Living Arrangements/Services Living arrangements for the past 2 months: [place]   Patient language and need for interpreter reviewed:: Yes Do you feel safe going back to the place where you live?: Yes      Need for Family Participation in Patient Care: Yes (Comment) Care giver support system in place?: Yes (comment)   Criminal Activity/Legal Involvement Pertinent to Current Situation/Hospitalization: No - Comment as needed  Activities of Daily Living Home Assistive Devices/Equipment: Bedside commode/3-in-1, Blood pressure cuff, Built-in shower seat, Dentures (specify type), Eyeglasses, Grab bars around toilet, Grab bars in shower, Hospital bed, Oxygen, Scales, Raised toilet seat with rails ADL Screening (condition at time of admission) Patient's cognitive ability adequate to safely complete daily activities?: Yes Is the patient deaf or have difficulty hearing?: Yes Does the patient have  difficulty seeing, even when wearing glasses/contacts?: Yes Does the patient have difficulty concentrating, remembering, or making decisions?: No Patient able to express need for assistance with ADLs?: Yes Does the patient have difficulty dressing or bathing?: Yes Independently performs ADLs?: No Communication: Independent Dressing (OT): Needs assistance Is this a change from baseline?: Pre-admission baseline Grooming: Needs assistance Is this a change from baseline?: Pre-admission baseline Feeding: Independent Bathing: Needs assistance Is this a change from baseline?: Pre-admission baseline Toileting: Needs assistance Is this a change from baseline?: Pre-admission baseline In/Out Bed: Needs assistance Is this a change from baseline?: Pre-admission baseline Walks in Home: Needs assistance Is this a change from baseline?: Pre-admission baseline Does the patient have difficulty walking or climbing stairs?: Yes Weakness of Legs: Both Weakness of Arms/Hands: Both  Permission Sought/Granted                  Emotional Assessment       Orientation: : Oriented to Self, Oriented to Place, Oriented to Situation Alcohol / Substance Use: Not Applicable Psych Involvement: No (comment)  Admission diagnosis:  Acute respiratory failure (HCC) [J96.00] DVT (deep venous thrombosis) (HCC) [I82.409] NSTEMI (non-ST elevated myocardial infarction) ([place]) [I21.4] Altered mental status, unspecified altered mental status type [R41.82] Pain and swelling of left lower leg [id number], M79.89] Acute on chronic respiratory failure with hypoxia and hypercapnia (HCC) [J96.21, J96.22] Sepsis, due to unspecified organism, unspecified whether acute organ  dysfunction present [facility]) [A41.9] Patient Active Problem List   Diagnosis Date Noted   Acute respiratory failure ([place]) 11/08/2020   Hyperkalemia 11/08/2020   Sepsis ([place]) [id number]   Acute metabolic encephalopathy [id number]   NSVT (nonsustained  ventricular tachycardia) ([place]) 11/08/2020   NSTEMI (non-ST elevated myocardial infarction) ([place]) 11/08/2020   Acute respiratory failure with hypoxia ([place]) 10/27/2020   Acute on chronic systolic CHF (congestive heart failure) ([place]) 10/26/2020   Elevated troponin 10/24/2020   CHF exacerbation ([place]) 10/23/2020   Hypotension 10/06/2020   Multiple rib fractures 10/05/2020   Uncontrolled pain 10/05/2020   AKI (acute kidney injury) ([place])    Weakness 06/30/2019   Dyspnea 06/30/2019   Subtherapeutic anticoagulation    Acute on chronic respiratory failure with hypoxia and hypercapnia (HCC)    Acute on chronic systolic HF (heart failure) (HCC)    LV (left ventricular) mural thrombus [id number]   Systolic and diastolic CHF, acute on chronic (HCC)    Hypokalemia    CHF (congestive heart failure) ([place]) 09/01/2015   Respiratory failure with hypoxia ([place]) 08/31/2015   HCAP (healthcare-associated pneumonia) 08/31/2015   Acute respiratory failure with hypoxia and hypercapnia ([place])    Encounter for central line placement    Knee swelling    Subtherapeutic international normalized ratio (INR) 08/08/2015   Constipation 08/08/2015   Hyperlipidemia 08/08/2015   Blurred vision 07/29/2015   Anemia 07/29/2015   S/P total knee replacement 07/27/2015   S/P CABG x 2 10/08/2014   Chest pain 10/01/2014   Elevated INR 10/01/2014   Leukocytosis 10/01/2014   Thrombocytopenia ([place]) 10/01/2014   Chest pain at rest 10/01/2014   Unstable angina ([place]) 10/01/2014   Coagulopathy ([place]) 10/01/2014   Paroxysmal atrial fibrillation ([place]) 10/01/2014   Coronary artery disease    Hypertension    Arthritis    Peripheral vascular disease ([place])    Essential hypertension    PCP:  Pablo Lawrence, NP Pharmacy:   [facility] [place] - [address] [place] [address] Phone: [phone] Fax: [phone]  [facility] - [place] [place] [address] Phone: [phone] Fax: [phone]     Social Determinants of Health (SDOH) Interventions    Readmission Risk Interventions Readmission Risk Prevention Plan 11/10/2020 10/11/2020 07/04/2019  Transportation Screening Complete Complete Complete  PCP or Specialist Appt within 3-5 Days - Complete Not Complete  HRI or Home Care Consult Complete Complete Complete  Social Work Consult for [place] Planning/Counseling Complete Complete Complete  Palliative Care Screening - Not Applicable Not Applicable  Medication Review Press photographer) Complete Complete Complete  Some recent data might be hidden

## 2020-11-10 NOTE — Progress Notes (Addendum)
Dr Sidney Ace notified and will come to bedside due to MEWS is Yellow. STAT ABG and EKG ordered.

## 2020-11-10 NOTE — Progress Notes (Signed)
   11/10/20 1946  Assess: MEWS Score  Temp 98.2 F (36.8 C)  BP 136/80  Pulse Rate (!) 104  Resp (!) 30  SpO2 100 %  O2 Device Nasal Cannula  O2 Flow Rate (L/min) 3 L/min  Assess: MEWS Score  MEWS Temp 0  MEWS Systolic 0  MEWS Pulse 1  MEWS RR 2  MEWS LOC 0  MEWS Score 3  MEWS Score Color Yellow  Assess: SIRS CRITERIA  SIRS Temperature  0  SIRS Pulse 1  SIRS Respirations  1  SIRS WBC 0  SIRS Score Sum  2  Pt's RR is elevating to 30. Called RT come to evaluate and Dr Sidney Ace notified.

## 2020-11-10 NOTE — Progress Notes (Signed)
PROGRESS NOTE  Benjamin Cordova  DOB: November 21, 1934  PCP: Pablo Lawrence, NP [id number]  DOA: 11/08/2020  LOS: 2 days  Hospital Day: 3   Chief Complaint  Patient presents with   Respiratory Distress   Brief narrative: Benjamin Cordova is a 86 y.o. male with PMH significant for HTN, stroke, PAD, CAD/CABG, ischemic cardiomyopathy, paroxysmal A. fib, history of LV mural thrombus. Patient was brought to ED from [place] on 8/9 for evaluation of altered mental status and hypoxia. Nursing home staff found the patient unresponsive with a oxygen saturation of 85% on room air.  He was placed on 15 L of oxygen by nonrebreather mask with improvement in oxygen saturation and sent to ED.  In the ED, blood gas showed an uncompensated respiratory acidosis with pH of 7.3, PCO2 elevated at 79 and hence patient was placed on BiPAP. Labs showed potassium elevated to 5.4, bicarbonate 33, BNP elevated to more than 1000, troponin elevated to 449>> 841, lactic acid elevated to 2.1>> 2.9, WBC count elevated to 17.4 Respiratory virus panel negative CT scan of head did not show any acute abnormality.  It showed chronic cortical/subcortical infarct within the right frontal, parietal and temporal lobes. Chest x-ray showed bilateral small to moderate pleural effusion.  It also showed additional patchy ill-defined opacities in bilateral lower lung fields. Patient was admitted to hospitalist service for further evaluation management   Subjective: Patient was seen and examined this morning.  Patient was breathing on 2 L oxygen by nasal cannula.  Alert, awake, slow to answer oriented x3.    Assessment/Plan: Acute on chronic systolic heart failure -Echo from 10/07/2020 showed EF of 20%, severe hypokinesis and severe dilation of the LV.   -Currently on metoprolol succinate 50 mg daily, Lasix IV 40 mg daily -Net IO Since Admission: -1,820.77 mL [11/10/20 1324] -Continue to monitor for daily intake output, weight,  blood pressure, BNP, renal function and electrolytes. Recent Labs  Lab 11/08/20 0935 11/09/20 0631 11/10/20 0528  BNP 1,087.0*  --   --   BUN 35* 41* 42*  CREATININE 0.89 0.89 0.97  K 5.4* 4.7 4.3  MG 2.2  --  2.5*   Acute on chronic respiratory failure with hypoxia and hypercapnia -Brought from nursing facility after he was noted to be unresponsive with O2 sat low at 55%. -Probably related to acute exacerbation of CHF. -Initially required BiPAP.  Currently on 3lpm oxygen by nasal cannula.  Wean down as tolerated.  Hyperkalemia -Potassium level was elevated to 5.4 on admission. Improved to normal range at 4.7 this morning.  Continue to monitor. Recent Labs  Lab 11/08/20 0935 11/09/20 0631 11/10/20 0528  K 5.4* 4.7 4.3  MG 2.2  --  2.5*  PHOS  --   --  3.8   SIRS -POA Lactic acidosis -Presented with hypoxia, elevated WBC count, elevated lactic acid level. -However, no clear evidence of pneumonia in the imaging.  Procalcitonin level negative x2. -Empiric antibiotics were stopped. Recent Labs  Lab 11/08/20 0935 11/08/20 1120 11/08/20 1533 11/08/20 1634 11/09/20 0631 11/10/20 0528  WBC 17.4*  --   --   --  9.2 8.3  LATICACIDVEN 2.1* 2.9* 2.6* 2.3*  --   --   PROCALCITON <0.10  --   --   --  <0.10  --    Elevated troponin History of CAD/CABG, PAD -EKG without any ST-T wave changes. -Troponin elevated, trend as below.  Initially placed on heparin drip -Cardiology consultation was obtained.  In the  absence of chest pain, shortness of breath, ischemic evaluation was not pursued.  -Continue simvastatin Recent Labs    11/08/20 1120 11/08/20 1533 11/08/20 1813  TROPONINIHS 841* 2,805* 3,275*    History of paroxysmal atrial fibrillation/LV thrombus -Patient was on Coumadin with subtherapeutic INR -Continue heparin drip until INR becomes therapeutic -Check daily PT/INR Recent Labs  Lab 11/08/20 0935 11/09/20 0631 11/10/20 0528  INR 1.4* 1.9* 1.6*    BPH -Continue finasteride and Flomax.  -Has a Foley catheter in place.  Per nursing staff he was placed on 7/24.  Acute metabolic encephalopathy -Altered mental status resolved.   -Alert, awake, oriented x3 this morning.  Mobility: Encourage ambulation.  Pending PT eval. Code Status:   Code Status: DNR  Nutritional status: Body mass index is 33.66 kg/m.     Diet:  Diet Order             Diet Heart Room service appropriate? Yes; Fluid consistency: Thin  Diet effective now                  DVT prophylaxis: Heparin drip bridge with Coumadin  warfarin (COUMADIN) tablet 12 mg   Antimicrobials: Okay to stop antibiotics Fluid: None Consultants: Cardiology Family Communication: None at bedside  Status is: Inpatient  Remains inpatient appropriate because: Needs IV diuresis  Dispo: The patient is from: [place] facility              Anticipated d/c is to: Back to nursing facility              Patient currently is not medically stable to d/c.   Difficult to place patient No     Infusions:   heparin 1,250 Units/hr (11/10/20 0303)    Scheduled Meds:  Chlorhexidine Gluconate Cloth  6 each Topical Daily   finasteride  5 mg Oral Daily   furosemide  40 mg Intravenous Daily   metoprolol succinate  50 mg Oral Daily   senna-docusate  1 tablet Oral QHS   simvastatin  40 mg Oral QHS   spironolactone  12.5 mg Oral Daily   tamsulosin  0.4 mg Oral q AM   warfarin  12 mg Oral ONCE-1600   Warfarin - Pharmacist Dosing Inpatient   Does not apply q1600    Antimicrobials: Anti-infectives (From admission, onward)    Start     Dose/Rate Route Frequency Ordered Stop   11/09/20 1600  vancomycin (VANCOREADY) IVPB 1500 mg/300 mL  Status:  Discontinued        1,500 mg 150 mL/hr over 120 Minutes Intravenous Every 24 hours 11/08/20 1446 11/09/20 1304   11/08/20 1600  ceFEPIme (MAXIPIME) 2 g in sodium chloride 0.9 % 100 mL IVPB  Status:  Discontinued        2 g 200  mL/hr over 30 Minutes Intravenous Every 8 hours 11/08/20 1403 11/09/20 1304   11/08/20 1400  vancomycin (VANCOCIN) IVPB 1000 mg/200 mL premix        1,000 mg 200 mL/hr over 60 Minutes Intravenous  Once 11/08/20 1357 11/08/20 1619   11/08/20 1400  ceFEPIme (MAXIPIME) 2 g in sodium chloride 0.9 % 100 mL IVPB  Status:  Discontinued        2 g 200 mL/hr over 30 Minutes Intravenous  Once 11/08/20 1357 11/08/20 1403   11/08/20 1000  ceFEPIme (MAXIPIME) 2 g in sodium chloride 0.9 % 100 mL IVPB        2 g 200 mL/hr over 30 Minutes Intravenous  Once 11/08/20 0957 11/08/20 1117   11/08/20 1000  vancomycin (VANCOCIN) IVPB 1000 mg/200 mL premix        1,000 mg 200 mL/hr over 60 Minutes Intravenous  Once 11/08/20 0957 11/08/20 1242       PRN meds: acetaminophen **OR** acetaminophen, ipratropium-albuterol, ondansetron **OR** ondansetron (ZOFRAN) IV   Objective: Vitals:   11/10/20 0725 11/10/20 1125  BP: 102/66 (!) 141/67  Pulse: 100 90  Resp: 18 18  Temp: (!) 97.4 F (36.3 C) (!) 97.5 F (36.4 C)  SpO2: 95% 94%    Intake/Output Summary (Last 24 hours) at 11/10/2020 1324 Last data filed at 11/10/2020 0345 Gross per 24 hour  Intake 1581.61 ml  Output 2800 ml  Net -1218.39 ml   Filed Weights   11/10/20 0937  Weight: 100.4 kg   Weight change:  Body mass index is 33.66 kg/m.   Physical Exam: General exam: Pleasant, elderly Caucasian male.  Not in physical distress Skin: No rashes, lesions or ulcers. HEENT: Atraumatic, normocephalic, no obvious bleeding Lungs: Clear to auscultation bilaterally CVS: Regular rate and rhythm, no murmur GI/Abd soft, nontender, nondistended, bowel sound present CNS: Alert, awake, oriented x3 Psychiatry: Mood appropriate Extremities: Trace bilateral pedal edema, no calf tenderness  Data Review: I have personally reviewed the laboratory data and studies available.  Recent Labs  Lab 11/08/20 0935 11/09/20 0631 11/10/20 0528  WBC 17.4* 9.2 8.3   NEUTROABS 15.3*  --   --   HGB 12.0* 10.2* 10.6*  HCT 38.1* 32.9* 33.6*  MCV 99.2 98.5 99.7  PLT 300 174 185   Recent Labs  Lab 11/08/20 0935 11/09/20 0631 11/10/20 0528  NA 140 142 142  K 5.4* 4.7 4.3  CL 95* 97* 98  CO2 33* 35* 40*  GLUCOSE 150* 109* 132*  BUN 35* 41* 42*  CREATININE 0.89 0.89 0.97  CALCIUM 9.4 9.1 8.9  MG 2.2  --  2.5*  PHOS  --   --  3.8    F/u labs ordered Unresulted Labs (From admission, onward)     Start     Ordered   11/11/20 0500  Heparin level (unfractionated)  Tomorrow morning,   R        11/10/20 0636   11/11/20 0500  CBC  Tomorrow morning,   R        11/10/20 0636   11/11/20 0500  Lactic acid, plasma  Tomorrow morning,   R        11/10/20 0737   11/10/20 0500  Protime-INR  Daily,   R      11/09/20 1030   11/10/20 [id number]  Basic metabolic panel  Daily,   R      11/09/20 1441   11/09/20 2200  Urine Culture  Once,   AD        11/09/20 2200            Signed, Terrilee Croak, MD [facility] 11/10/2020

## 2020-11-10 NOTE — Consult Note (Signed)
[place] for IV heparin/warfarin Indication: chest pain/ACS; Atrial fibrillation  Patient Measurements: Heparin Dosing Weight: 91 kg  Labs: Recent Labs    11/08/20 0935 11/08/20 1120 11/08/20 1533 11/08/20 1813 11/08/20 2136 11/09/20 0631 11/09/20 2011 11/10/20 0528  HGB 12.0*  --   --   --   --  10.2*  --  10.6*  HCT 38.1*  --   --   --   --  32.9*  --  33.6*  PLT 300  --   --   --   --  174  --  185  APTT 35  --   --   --   --   --   --   --   LABPROT 17.0*  --   --   --   --  22.0*  --  18.9*  INR 1.4*  --   --   --   --  1.9*  --  1.6*  HEPARINUNFRC  --   --   --   --    < > 0.23* 0.31 0.32  CREATININE 0.89  --   --   --   --  0.89  --   --   TROPONINIHS 449* 841* 2,805* 3,275*  --   --   --   --    < > = values in this interval not displayed.     Estimated Creatinine Clearance: 74.1 mL/min (by C-G formula based on SCr of 0.89 mg/dL).   Medical History: Past Medical History:  Diagnosis Date   Arthritis    Coronary artery disease    a. s/p CABG in 1981 b. cath in 10/2014 showing occluded grafts --> redo CABG in 2016 LIMA-LAD and Left Radial-OM2   Essential hypertension    Hematuria    History of blood transfusion 07/2015   Knee replacement HgB <9   History of pneumonia    History of stroke    Ischemic cardiomyopathy    Left ventricular apical thrombus    Mixed hyperlipidemia    Myocardial infarction [facility]) 1980   Peripheral vascular disease ([place])    Polio osteopathy of lower leg ([place]) Age 60   Affected right leg   Skin cancer    TIA (transient ischemic attack) 1989   Chronic coumadin   UTI (lower urinary tract infection)     Medications:  Warfarin 8 mg daily prior to admission (last PTA dose 8/8) Prior chronic regimen was 10 mg PO 6 days/week, and 5 mg daily on Fridays (TWD = 65 mg), during recent hospital admission 10/23/20 - 10/28/20, patient received 10 mg daily. Discharge orders to SNF where warfarin dose was  decreased to 7.5 mg daily starting 10/29/2020 with discharge orders for INR check on 10/31/20. Dose was recently increased to 8 mg daily at SNF (TWD = 56 mg)    Assessment: Patient is an 86 y/o M with medical history as above and including CAD s/p CABG, PVD, LV thrombus, TIA / CVA who was BIBEMS from [facility] for unresponsiveness and respiratory failure. Troponin elevated to 449. Pharmacy has been consulted to initiate heparin infusion for suspected ACS.  Baseline H&H, platelets acceptable. Baseline INR 1.4 (on warfarin as outpatient), aPTT 35.   Warfarin: Patient on chronic warfarin therapy for paroxymal atrial fibrillation, recurrent TIAs, and LV thrombus. Prior chronic regimen was 10 mg PO 6 days/week, and 5 mg daily on Fridays (TWD = 65 mg), during recent hospital admission 10/23/20 - 10/28/20, patient  received 10 mg daily. Discharge orders to SNF where warfarin dose was decreased to 7.5 mg daily starting 10/29/2020 with discharge orders for INR check on 10/31/20. Dose was recently increased to 8 mg daily at SNF (TWD = 56 mg). INR on admission 1.4 (subtherapeutic). Pharmacy has been consulted to restart warfarin therapy.   DD interactions: --Cefepime IV 3 doses -- last dose 8/10 @ [id number] -- discontinued   Date INR Warfarin Dose  8/8 -- 8 mg (PTA) 8/9 1.4 Held -- IV heparin infusion for ACS started  8/10 1.9   Goal of Therapy:  INR 2-3  Heparin level 0.3-0.7 units/ml Monitor platelets by anticoagulation protocol: Yes  0809 2136 HL 0.41, therapeutic; 1100 units/hr 0810 0631 HL 0.23, subtherapeutic; 1100 units/hr 0810 2011 HL 0.31, therapeutic; 1250 un/hr 0811 0528 HL 0.32, therapeutic   Plan:  Warfarin: INR 1.4 (subtherapeutic on admission) >> 1.9  Although subtherapeutic on admission, will give home dose of 8 mg x 1 tonight given recent abx exposure INR daily  Heparin: --Heparin level is therapeutic, will continue heparin infusion at 1250 units/hr --Will recheck HL daily w/ AM  labs while therapeutic on heparin --Daily CBC per protocol while on IV heparin --Monitor closely for s/sx of bleeding  --Note INR trending up (1.4 >> 1.9). Continue to follow INR. May be at increased risk of bleeding given residual warfarin effect  Renda Rolls, PharmD, [facility] 11/10/2020 6:34 AM

## 2020-11-10 NOTE — Evaluation (Signed)
Physical Therapy Evaluation Patient Details Name: Benjamin Cordova MRN: [id number] DOB: Oct 23, 1934 Today's Date: 11/10/2020   History of Present Illness  Benjamin Cordova is a 86 y.o. male with PMH significant for HTN, stroke, PAD, CAD/CABG, ischemic cardiomyopathy, paroxysmal A. fib, history of LV mural thrombus.  Patient was brought to ED from [place] on 8/9 for evaluation of altered mental status and hypoxia.  Nursing home staff found the patient unresponsive with a oxygen saturation of 85% on room air.  He was placed on 15 L of oxygen by nonrebreather mask with improvement in oxygen saturation and sent to ED.     In the ED, blood gas showed an uncompensated respiratory acidosis with pH of 7.3, PCO2 elevated at 79 and hence patient was placed on BiPAP. Pt with elevated troponin on heparin drip, secure chat sent to MD and cleared for participation.  Clinical Impression  Pt is a pleasant 86 year old male who was admitted for sepsis and unresponsive episode with hypoxia. Currently on 2L of O2 however desats with minimal exertion to 86% with cues for pursed lip breathing. O2 improves to 92%.  Pt performs bed mobility with mod assist, however demonstrates poor seated balance, not safe for further transfers. Pt demonstrates deficits with strength/mobility/balance/endurance. Pt is very motivated to participate and is hoping to remain admitted at [facility] until he is stable for dc to home. Is not wanting to return to previous SNF environment. Would benefit from skilled PT to address above deficits and promote optimal return to PLOF; recommend transition to STR upon discharge from acute hospitalization. Will need B PF RW brought to room for OOB mobility.     Follow Up Recommendations SNF (does not want to return to previous SNF)    Equipment Recommendations  None recommended by PT    Recommendations for Other Services       Precautions / Restrictions Precautions Precautions:  Fall Restrictions Weight Bearing Restrictions: No      Mobility  Bed Mobility Overal bed mobility: Needs Assistance Bed Mobility: Supine to Sit;Sit to Supine     Supine to sit: Mod assist Sit to supine: Min assist   General bed mobility comments: needs assist for B LEs. Once seated, mod L side leaning noted, unable to self correct. Quick fatigue and uncontrolled decent back to bed. Able to use B UE for repositioning higher in bed. O2 sats decreased to 86% on 2L with HR at 114bpm with exertion.    Transfers                 General transfer comment: NT  Ambulation/Gait                Stairs            Wheelchair Mobility    Modified Rankin (Stroke Patients Only)       Balance Overall balance assessment: Needs assistance Sitting-balance support: Bilateral upper extremity supported;Feet supported Sitting balance-Leahy Scale: Poor Sitting balance - Comments: L lateral lean                                     Pertinent Vitals/Pain Pain Assessment: No/denies pain    Home Living Family/patient expects to be discharged to:: Private residence Living Arrangements: Alone Available Help at Discharge: Available PRN/intermittently Type of Home: House Home Access: Stairs to enter Entrance Stairs-Rails: Left Entrance Stairs-Number of Steps: 3 Home Layout: One  level Home Equipment: Walker - 4 wheels;Walker - 2 wheels;Grab bars - tub/shower;Shower seat - built in;Shower seat;Bedside commode;Hand held shower head Additional Comments: recently has been at [facility] ([facility]) recently since hospital dc at 7/29.    Prior Function Level of Independence: Independent with assistive device(s)         Comments: previously was using B platform RW due to R ankle weakness and was ambulatory in home environment. Reports he hasn't been ambulatory in past few weeks while at rehab.     Hand Dominance        Extremity/Trunk Assessment    Upper Extremity Assessment Upper Extremity Assessment: Overall WFL for tasks assessed    Lower Extremity Assessment Lower Extremity Assessment: Generalized weakness (R anke DF 2/5; 3+/5 rest of B LEs)       Communication   Communication: No difficulties;HOH  Cognition Arousal/Alertness: Awake/alert Behavior During Therapy: WFL for tasks assessed/performed Overall Cognitive Status: Within Functional Limits for tasks assessed                                        General Comments      Exercises Other Exercises Other Exercises: supine ther-ex performed on B LE including SLRs, quad sets, and heel slides. 10 reps performed with min assist   Assessment/Plan    PT Assessment Patient needs continued PT services  PT Problem List Decreased strength;Decreased knowledge of use of DME;Decreased activity tolerance;Decreased balance;Decreased mobility       PT Treatment Interventions DME instruction;Balance training;Gait training;Neuromuscular re-education;Stair training;Functional mobility training;Patient/family education;Therapeutic activities;Therapeutic exercise    PT Goals (Current goals can be found in the Care Plan section)  Acute Rehab PT Goals Patient Stated Goal: stay in hospital until he can go home PT Goal Formulation: With patient Time For Goal Achievement: 11/24/20 Potential to Achieve Goals: Good    Frequency Min 2X/week   Barriers to discharge        Co-evaluation               AM-PAC PT "6 Clicks" Mobility  Outcome Measure Help needed turning from your back to your side while in a flat bed without using bedrails?: A Little Help needed moving from lying on your back to sitting on the side of a flat bed without using bedrails?: A Lot Help needed moving to and from a bed to a chair (including a wheelchair)?: Total Help needed standing up from a chair using your arms (e.g., wheelchair or bedside chair)?: Total Help needed to walk in hospital  room?: Total Help needed climbing 3-5 steps with a railing? : Total 6 Click Score: 9    End of Session Equipment Utilized During Treatment: Oxygen Activity Tolerance: Patient limited by fatigue Patient left: in bed;with bed alarm set Nurse Communication: Mobility status PT Visit Diagnosis: Unsteadiness on feet (R26.81);History of falling (Z91.81);Difficulty in walking, not elsewhere classified (R26.2);Muscle weakness (generalized) (M62.81)    Time: [id number] PT Time Calculation (min) (ACUTE ONLY): 34 min   Charges:   PT Evaluation $PT Eval Moderate Complexity: 1 Mod PT Treatments $Therapeutic Exercise: 8-22 mins $Therapeutic Activity: 8-22 mins        Greggory Stallion, PT, DPT [phone]   Rylee Huestis 11/10/2020, 3:16 PM

## 2020-11-10 NOTE — Consult Note (Signed)
[place] for IV heparin/warfarin Indication: chest pain/ACS; Atrial fibrillation  Patient Measurements: Heparin Dosing Weight: 91 kg  Labs: Recent Labs    11/08/20 0935 11/08/20 1120 11/08/20 1533 11/08/20 1813 11/08/20 2136 11/09/20 0631 11/09/20 2011 11/10/20 0528  HGB 12.0*  --   --   --   --  10.2*  --  10.6*  HCT 38.1*  --   --   --   --  32.9*  --  33.6*  PLT 300  --   --   --   --  174  --  185  APTT 35  --   --   --   --   --   --   --   LABPROT 17.0*  --   --   --   --  22.0*  --  18.9*  INR 1.4*  --   --   --   --  1.9*  --  1.6*  HEPARINUNFRC  --   --   --   --    < > 0.23* 0.31 0.32  CREATININE 0.89  --   --   --   --  0.89  --  0.97  TROPONINIHS 449* 841* 2,805* 3,275*  --   --   --   --    < > = values in this interval not displayed.     Estimated Creatinine Clearance: 66.3 mL/min (by C-G formula based on SCr of 0.97 mg/dL).   Medical History: Past Medical History:  Diagnosis Date   Arthritis    Coronary artery disease    a. s/p CABG in 1981 b. cath in 10/2014 showing occluded grafts --> redo CABG in 2016 LIMA-LAD and Left Radial-OM2   Essential hypertension    Hematuria    History of blood transfusion 07/2015   Knee replacement HgB <9   History of pneumonia    History of stroke    Ischemic cardiomyopathy    Left ventricular apical thrombus    Mixed hyperlipidemia    Myocardial infarction [facility]) 1980   Peripheral vascular disease ([place])    Polio osteopathy of lower leg ([place]) Age 86   Affected right leg   Skin cancer    TIA (transient ischemic attack) 1989   Chronic coumadin   UTI (lower urinary tract infection)     Medications:  Warfarin 8 mg daily prior to admission (last PTA dose 8/8) Prior chronic regimen was 10 mg PO 6 days/week, and 5 mg daily on Fridays (TWD = 65 mg), during recent hospital admission 10/23/20 - 10/28/20, patient received 10 mg daily. Discharge orders to SNF where warfarin dose was  decreased to 7.5 mg daily starting 10/29/2020 with discharge orders for INR check on 10/31/20. Dose was recently increased to 8 mg daily at SNF (TWD = 56 mg)    Assessment: Patient is an 86 y/o M with medical history as above and including CAD s/p CABG, PVD, LV thrombus, TIA / CVA who was BIBEMS from [facility] for unresponsiveness and respiratory failure. Troponin elevated to 449. Pharmacy has been consulted to initiate heparin infusion for suspected ACS.  Baseline H&H, platelets acceptable. Baseline INR 1.4 (on warfarin as outpatient), aPTT 35.   Warfarin: Patient on chronic warfarin therapy for paroxymal atrial fibrillation, recurrent TIAs, and LV thrombus. Prior chronic regimen was 10 mg PO 6 days/week, and 5 mg daily on Fridays (TWD = 65 mg), during recent hospital admission 10/23/20 - 10/28/20, patient received 10  mg daily. Discharge orders to SNF where warfarin dose was decreased to 7.5 mg daily starting 10/29/2020 with discharge orders for INR check on 10/31/20. Dose was recently increased to 8 mg daily at SNF (TWD = 56 mg). INR on admission 1.4 (subtherapeutic). Pharmacy has been consulted to restart warfarin therapy.   DD interactions: --Cefepime IV 3 doses -- last dose 8/10 @ [id number] -- discontinued   Date INR Warfarin Dose  8/8 -- 8 mg (PTA) 8/9 1.4 Held -- IV heparin infusion for ACS started  8/10 1.9   Goal of Therapy:  INR 2-3  Heparin level 0.3-0.7 units/ml Monitor platelets by anticoagulation protocol: Yes  0809 2136 HL 0.41, therapeutic; 1100 units/hr 0810 0631 HL 0.23, subtherapeutic; 1100 units/hr   Plan:  Warfarin: 8/9:  INR 1.4 8/10  INR 1.9 (dose not ordered) 8/11 INR 1.6  Will order Warfarin 12 mg x 1 tonight(1.5 times his home dose) INR daily  Heparin: --Heparin level is subtherapeutic --Heparin 1300 unit IV bolus and increase heparin infusion to 1250 units/hr --Will recheck HL 8 hours after rate change --Daily CBC per protocol while on IV heparin; H&H and  platelets are down-trending today. Monitor closely for s/sx of bleeding --Note INR trending up (1.4 >> 1.9). Continue to follow INR. May be at increased risk of bleeding given residual warfarin effect  Pearla Dubonnet, PharmD Clinical Pharmacist   11/10/2020 12:57 PM

## 2020-11-11 DIAGNOSIS — I214 Non-ST elevation (NSTEMI) myocardial infarction: Secondary | ICD-10-CM | POA: Diagnosis not present

## 2020-11-11 DIAGNOSIS — I5023 Acute on chronic systolic (congestive) heart failure: Secondary | ICD-10-CM | POA: Diagnosis not present

## 2020-11-11 LAB — BASIC METABOLIC PANEL
Anion gap: 9 (ref 5–15)
BUN: 39 mg/dL — ABNORMAL HIGH (ref 8–23)
CO2: 39 mmol/L — ABNORMAL HIGH (ref 22–32)
Calcium: 9 mg/dL (ref 8.9–10.3)
Chloride: 93 mmol/L — ABNORMAL LOW (ref 98–111)
Creatinine, Ser: 0.9 mg/dL (ref 0.61–1.24)
GFR, Estimated: >60 mL/min (ref >60)
Glucose, Bld: 123 mg/dL — ABNORMAL HIGH (ref 70–99)
Potassium: 4.8 mmol/L (ref 3.5–5.1)
Sodium: 141 mmol/L (ref 135–145)

## 2020-11-11 LAB — CBC
HCT: 33.8 % — ABNORMAL LOW (ref 39.0–52.0)
Hemoglobin: 10.4 g/dL — ABNORMAL LOW (ref 13.0–17.0)
MCH: 30.2 pg (ref 26.0–34.0)
MCHC: 30.8 g/dL (ref 30.0–36.0)
MCV: 98.3 fL (ref 80.0–100.0)
Platelets: 180 10*3/uL (ref 150–400)
RBC: 3.44 MIL/uL — ABNORMAL LOW (ref 4.22–5.81)
RDW: 13.1 % (ref 11.5–15.5)
WBC: 12.3 10*3/uL — ABNORMAL HIGH (ref 4.0–10.5)
nRBC: 0 % (ref 0.0–0.2)

## 2020-11-11 LAB — BLOOD GAS, ARTERIAL
Acid-Base Excess: 18.3 mmol/L — ABNORMAL HIGH (ref 0.0–2.0)
Bicarbonate: 47.5 mmol/L — ABNORMAL HIGH (ref 20.0–28.0)
Expiratory PAP: 8
FIO2: 35
Inspiratory PAP: 18
O2 Saturation: 97.8 %
Patient temperature: 37
pCO2 arterial: 86 mmHg (ref 32.0–48.0)
pH, Arterial: 7.35 (ref 7.350–7.450)
pO2, Arterial: 106 mmHg (ref 83.0–108.0)

## 2020-11-11 LAB — HEPARIN LEVEL (UNFRACTIONATED)
Heparin Unfractionated: 0.16 IU/mL — ABNORMAL LOW (ref 0.30–0.70)
Heparin Unfractionated: 0.49 IU/mL (ref 0.30–0.70)
Heparin Unfractionated: 0.33 IU/mL (ref 0.30–0.70)

## 2020-11-11 LAB — PROTIME-INR
INR: 1.3 — ABNORMAL HIGH (ref 0.8–1.2)
Prothrombin Time: 16.5 seconds — ABNORMAL HIGH (ref 11.4–15.2)

## 2020-11-11 LAB — LACTIC ACID, PLASMA: Lactic Acid, Venous: 0.8 mmol/L (ref 0.5–1.9)

## 2020-11-11 MED ORDER — WARFARIN SODIUM 6 MG PO TABS
12.0000 mg | ORAL_TABLET | Freq: Once | ORAL | Status: AC
  Administered 2020-11-11: 12 mg via ORAL
  Filled 2020-11-11: qty 2

## 2020-11-11 MED ORDER — HEPARIN BOLUS VIA INFUSION
2700.0000 [IU] | Freq: Once | INTRAVENOUS | Status: AC
  Administered 2020-11-11: 2700 [IU] via INTRAVENOUS
  Filled 2020-11-11: qty 2700

## 2020-11-11 NOTE — NC FL2 (Addendum)
[place] LEVEL OF CARE SCREENING TOOL     IDENTIFICATION  Patient Name: Benjamin Cordova Birthdate: [birth year] Sex: male Admission Date (Current Location): 11/08/2020  [place] and [place] Number:  Engineering geologist and Address:  [facility], [address] [place], [place] [address]      Provider Number: [id number]  Attending Physician Name and Address:  Terrilee Croak, MD  Relative Name and Phone Number:  Deovion, Pioli (Son)   [phone]    Current Level of Care: Hospital Recommended Level of Care: [place] Prior Approval Number:    Date Approved/Denied:   PASRR Number: [id number]  Discharge Plan: SNF    Current Diagnoses: Patient Active Problem List   Diagnosis Date Noted   Acute respiratory failure ([place]) 11/08/2020   Hyperkalemia 11/08/2020   Sepsis ([place]) [id number]   Acute metabolic encephalopathy [id number]   NSVT (nonsustained ventricular tachycardia) ([place]) 11/08/2020   NSTEMI (non-ST elevated myocardial infarction) ([place]) 11/08/2020   Acute respiratory failure with hypoxia ([place]) 10/27/2020   Acute on chronic systolic CHF (congestive heart failure) ([place]) 10/26/2020   Elevated troponin 10/24/2020   CHF exacerbation ([place]) 10/23/2020   Hypotension 10/06/2020   Multiple rib fractures 10/05/2020   Uncontrolled pain 10/05/2020   AKI (acute kidney injury) ([place])    Weakness 06/30/2019   Dyspnea 06/30/2019   Subtherapeutic anticoagulation    Acute on chronic respiratory failure with hypoxia and hypercapnia (HCC)    Acute on chronic systolic HF (heart failure) (HCC)    LV (left ventricular) mural thrombus [id number]   Systolic and diastolic CHF, acute on chronic (HCC)    Hypokalemia    CHF (congestive heart failure) ([place]) 09/01/2015   Respiratory failure with hypoxia ([place]) 08/31/2015   HCAP (healthcare-associated pneumonia) 08/31/2015   Acute respiratory failure with hypoxia and hypercapnia (HCC)     Encounter for central line placement    Knee swelling    Subtherapeutic international normalized ratio (INR) 08/08/2015   Constipation 08/08/2015   Hyperlipidemia 08/08/2015   Blurred vision 07/29/2015   Anemia 07/29/2015   S/P total knee replacement 07/27/2015   S/P CABG x 2 10/08/2014   Chest pain 10/01/2014   Elevated INR 10/01/2014   Leukocytosis 10/01/2014   Thrombocytopenia ([place]) 10/01/2014   Chest pain at rest 10/01/2014   Unstable angina (HCC) 10/01/2014   Coagulopathy ([place]) 10/01/2014   Paroxysmal atrial fibrillation ([place]) 10/01/2014   Coronary artery disease    Hypertension    Arthritis    Peripheral vascular disease (HCC)    Essential hypertension     Orientation RESPIRATION BLADDER Height & Weight     Self, Time, Situation, Place  O2 External catheter Weight: 221 lb 1.9 oz (100.3 kg) Height:     BEHAVIORAL SYMPTOMS/MOOD NEUROLOGICAL BOWEL NUTRITION STATUS      Incontinent Diet (heart, thin liquids)  AMBULATORY STATUS COMMUNICATION OF NEEDS Skin   Extensive Assist Verbally Bruising                       Personal Care Assistance Level of Assistance  Bathing, Feeding, Dressing Bathing Assistance: Maximum assistance Feeding assistance: Limited assistance Dressing Assistance: Maximum assistance     Functional Limitations Info  Sight, Hearing, Speech Sight Info: Impaired Hearing Info: Impaired Speech Info: Adequate    SPECIAL CARE FACTORS FREQUENCY         PT 5 x/week  OT 5 x/week  Contractures      Additional Factors Info  Code Status, Allergies Code Status Info: DNR Allergies Info: nka           Current Medications (11/11/2020):  This is the current hospital active medication list Current Facility-Administered Medications  Medication Dose Route Frequency Provider Last Rate Last Admin   acetaminophen (TYLENOL) tablet 650 mg  650 mg Oral Q6H PRN Agbata, Tochukwu, MD       Or   acetaminophen (TYLENOL) suppository 650 mg   650 mg Rectal Q6H PRN Agbata, Tochukwu, MD       Chlorhexidine Gluconate Cloth 2 % PADS 6 each  6 each Topical Daily Dahal, Marlowe Aschoff, MD   6 each at 11/11/20 0905   finasteride (PROSCAR) tablet 5 mg  5 mg Oral Daily Dahal, Binaya, MD   5 mg at 11/11/20 0904   furosemide (LASIX) injection 40 mg  40 mg Intravenous BID Kathlyn Sacramento A, MD   40 mg at 11/11/20 0905   guaiFENesin (MUCINEX) 12 hr tablet 600 mg  600 mg Oral BID Mansy, Jan A, MD   600 mg at 11/11/20 0904   heparin ADULT infusion 100 units/mL (25000 units/261m)  1,500 Units/hr Intravenous Continuous BRenda Rolls RPH 15 mL/hr at 11/11/20 1520 1,500 Units/hr at 11/11/20 1520   ipratropium-albuterol (DUONEB) 0.5-2.5 (3) MG/3ML nebulizer solution 3 mL  3 mL Nebulization Q4H PRN Agbata, Tochukwu, MD   3 mL at 11/10/20 2027   metoprolol succinate (TOPROL-XL) 24 hr tablet 50 mg  50 mg Oral Daily Dahal, Binaya, MD   50 mg at 11/11/20 0904   ondansetron (ZOFRAN) tablet 4 mg  4 mg Oral Q6H PRN Agbata, Tochukwu, MD       Or   ondansetron (ZOFRAN) injection 4 mg  4 mg Intravenous Q6H PRN Agbata, Tochukwu, MD       polyethylene glycol (MIRALAX / GLYCOLAX) packet 17 g  17 g Oral Daily PRN Dahal, BMarlowe Aschoff MD   17 g at 11/10/20 1739   senna-docusate (Senokot-S) tablet 1 tablet  1 tablet Oral QHS Dahal, BMarlowe Aschoff MD   1 tablet at 11/09/20 2221   simvastatin (ZOCOR) tablet 40 mg  40 mg Oral QHS Dahal, BMarlowe Aschoff MD   40 mg at 11/09/20 2221   spironolactone (ALDACTONE) tablet 12.5 mg  12.5 mg Oral Daily AKate Sable MD   12.5 mg at 11/11/20 0904   tamsulosin (FLOMAX) capsule 0.4 mg  0.4 mg Oral q AM Dahal, BMarlowe Aschoff MD   0.4 mg at 11/11/20 1220   warfarin (COUMADIN) tablet 12 mg  12 mg Oral ONCE-1600 NPearla Dubonnet [place]      Warfarin - Pharmacist Dosing Inpatient   Does not apply q1600 DDorothe Pea [facility]ols[facility]  Given at 11/10/20 1744     Discharge Medications: Please see discharge summary for a list of discharge medications.  Relevant Imaging  Results:  Relevant Lab Results:   Additional Information SS #: [id number] [place] LCSW

## 2020-11-11 NOTE — Progress Notes (Signed)
Placed patient on bipap. Said would wear for a while. Pressure decreased c/o pressure too high. See flowsheet

## 2020-11-11 NOTE — Progress Notes (Signed)
ABG    Component Value Date/Time   PHART 7.35 11/11/2020 0108   PCO2ART 86 (HH) 11/11/2020 0108   PO2ART 106 11/11/2020 0108   HCO3 47.5 (H) 11/11/2020 0108   TCO2 25 08/31/2015 0646   ACIDBASEDEF 2.0 08/31/2015 0646   O2SAT 97.8 11/11/2020 0108    Repeat abg done after patient was placed on bipap. Communicated with Dr. Sidney Ace, will continue same settings and repeat abg at 8am

## 2020-11-11 NOTE — Consult Note (Signed)
[place] for IV heparin/warfarin Indication: chest pain/ACS; Atrial fibrillation  Patient Measurements: Heparin Dosing Weight: 91 kg  Labs: Recent Labs    11/08/20 0935 11/08/20 1120 11/08/20 1533 11/08/20 1813 11/08/20 2136 11/09/20 0631 11/09/20 2011 11/10/20 0528 11/10/20 1451 11/11/20 0507  HGB 12.0*  --   --   --   --  10.2*  --  10.6*  --  10.4*  HCT 38.1*  --   --   --   --  32.9*  --  33.6*  --  33.8*  PLT 300  --   --   --   --  174  --  185  --  180  APTT 35  --   --   --   --   --   --   --   --   --   LABPROT 17.0*  --   --   --   --  22.0*  --  18.9*  --  16.5*  INR 1.4*  --   --   --   --  1.9*  --  1.6*  --  1.3*  HEPARINUNFRC  --   --   --   --    < > 0.23* 0.31 0.32  --  0.16*  CREATININE 0.89  --   --   --   --  0.89  --  0.97  --  0.90  TROPONINIHS 449*   < > 2,805* 3,275*  --   --   --   --  861*  --    < > = values in this interval not displayed.     Estimated Creatinine Clearance: 71.4 mL/min (by C-G formula based on SCr of 0.9 mg/dL).   Medical History: Past Medical History:  Diagnosis Date   Arthritis    Coronary artery disease    a. s/p CABG in 1981 b. cath in 10/2014 showing occluded grafts --> redo CABG in 2016 LIMA-LAD and Left Radial-OM2   Essential hypertension    Hematuria    History of blood transfusion 07/2015   Knee replacement HgB <9   History of pneumonia    History of stroke    Ischemic cardiomyopathy    Left ventricular apical thrombus    Mixed hyperlipidemia    Myocardial infarction [facility]) 1980   Peripheral vascular disease ([place])    Polio osteopathy of lower leg ([place]) Age 86   Affected right leg   Skin cancer    TIA (transient ischemic attack) 1989   Chronic coumadin   UTI (lower urinary tract infection)     Medications:  Warfarin 8 mg daily prior to admission (last PTA dose 8/8) Prior chronic regimen was 10 mg PO 6 days/week, and 5 mg daily on Fridays (TWD = 65 mg), during  recent hospital admission 10/23/20 - 10/28/20, patient received 10 mg daily. Discharge orders to SNF where warfarin dose was decreased to 7.5 mg daily starting 10/29/2020 with discharge orders for INR check on 10/31/20. Dose was recently increased to 8 mg daily at SNF (TWD = 56 mg)    Assessment: Patient is an 86 y/o M with medical history as above and including CAD s/p CABG, PVD, LV thrombus, TIA / CVA who was BIBEMS from [facility] for unresponsiveness and respiratory failure. Troponin elevated to 449. Pharmacy has been consulted to initiate heparin infusion for suspected ACS.  Baseline H&H, platelets acceptable. Baseline INR 1.4 (on warfarin as outpatient), aPTT 35.  Warfarin: Patient on chronic warfarin therapy for paroxymal atrial fibrillation, recurrent TIAs, and LV thrombus. Prior chronic regimen was 10 mg PO 6 days/week, and 5 mg daily on Fridays (TWD = 65 mg), during recent hospital admission 10/23/20 - 10/28/20, patient received 10 mg daily. Discharge orders to SNF where warfarin dose was decreased to 7.5 mg daily starting 10/29/2020 with discharge orders for INR check on 10/31/20. Dose was recently increased to 8 mg daily at SNF (TWD = 56 mg). INR on admission 1.4 (subtherapeutic). Pharmacy has been consulted to restart warfarin therapy.   DD interactions: --Cefepime IV 3 doses -- last dose 8/10 @ [id number] -- discontinued   Date INR Warfarin Dose  8/8 -- 8 mg (PTA) 8/9 1.4 Held -- IV heparin infusion for ACS started  8/10 1.9   Goal of Therapy:  INR 2-3  Heparin level 0.3-0.7 units/ml Monitor platelets by anticoagulation protocol: Yes  0809 2136 HL 0.41, therapeutic; 1100 units/hr 0810 0631 HL 0.23, subtherapeutic; 1100 units/hr 0810 2011 HL 0.31, therapeutic; 1250 un/hr 0811 0528 HL 0.32, therapeutic 0812 0507 HL 0.16, subtherapeutic   Plan:  Warfarin: 8/9:  INR 1.4 8/10  INR 1.9 (dose not ordered) 8/11 INR 1.6  Will order Warfarin 12 mg x 1 tonight(1.5 times his home  dose) INR daily  Heparin: --Heparin level is subtherapeutic --Bolus 2700 units x 1 --Increase heparin infusion at 1500 units/hr --Will recheck HL in 8 hr after rate change --Daily CBC per protocol while on IV heparin --Monitor closely for s/sx of bleeding  --Note INR trending up (1.4 >> 1.9). Continue to follow INR. May be at increased risk of bleeding given residual warfarin effect  Renda Rolls, PharmD, [facility] 11/11/2020 6:41 AM

## 2020-11-11 NOTE — Consult Note (Signed)
[place] for IV heparin/warfarin Indication: chest pain/ACS; Atrial fibrillation  Patient Measurements: Heparin Dosing Weight: 91 kg  Labs: Recent Labs    11/08/20 1533 11/08/20 1813 11/08/20 2136 11/09/20 0631 11/09/20 2011 11/10/20 0528 11/10/20 1451 11/11/20 0507  HGB  --   --    < > 10.2*  --  10.6*  --  10.4*  HCT  --   --   --  32.9*  --  33.6*  --  33.8*  PLT  --   --   --  174  --  185  --  180  LABPROT  --   --   --  22.0*  --  18.9*  --  16.5*  INR  --   --   --  1.9*  --  1.6*  --  1.3*  HEPARINUNFRC  --   --    < > 0.23* 0.31 0.32  --  0.16*  CREATININE  --   --   --  0.89  --  0.97  --  0.90  TROPONINIHS 2,805* 3,275*  --   --   --   --  861*  --    < > = values in this interval not displayed.     Estimated Creatinine Clearance: 71.4 mL/min (by C-G formula based on SCr of 0.9 mg/dL).   Medical History: Past Medical History:  Diagnosis Date   Arthritis    Coronary artery disease    a. s/p CABG in 1981 b. cath in 10/2014 showing occluded grafts --> redo CABG in 2016 LIMA-LAD and Left Radial-OM2   Essential hypertension    Hematuria    History of blood transfusion 07/2015   Knee replacement HgB <9   History of pneumonia    History of stroke    Ischemic cardiomyopathy    Left ventricular apical thrombus    Mixed hyperlipidemia    Myocardial infarction [facility]) 1980   Peripheral vascular disease ([place])    Polio osteopathy of lower leg ([place]) Age 86   Affected right leg   Skin cancer    TIA (transient ischemic attack) 1989   Chronic coumadin   UTI (lower urinary tract infection)     Medications:  Warfarin 8 mg daily prior to admission (last PTA dose 8/8) Prior chronic regimen was 10 mg PO 6 days/week, and 5 mg daily on Fridays (TWD = 65 mg), during recent hospital admission 10/23/20 - 10/28/20, patient received 10 mg daily. Discharge orders to SNF where warfarin dose was decreased to 7.5 mg daily starting 10/29/2020  with discharge orders for INR check on 10/31/20. Dose was recently increased to 8 mg daily at SNF (TWD = 56 mg)    Assessment: Patient is an 86 y/o M with medical history as above and including CAD s/p CABG, PVD, LV thrombus, TIA / CVA who was BIBEMS from [facility] for unresponsiveness and respiratory failure. Troponin elevated to 449. Pharmacy has been consulted to initiate heparin infusion for suspected ACS.  Baseline H&H, platelets acceptable. Baseline INR 1.4 (on warfarin as outpatient), aPTT 35.   Warfarin: Patient on chronic warfarin therapy for paroxymal atrial fibrillation, recurrent TIAs, and LV thrombus. Prior chronic regimen was 10 mg PO 6 days/week, and 5 mg daily on Fridays (TWD = 65 mg), during recent hospital admission 10/23/20 - 10/28/20, patient received 10 mg daily. Discharge orders to SNF where warfarin dose was decreased to 7.5 mg daily starting 10/29/2020 with discharge orders for INR check  on 10/31/20. Dose was recently increased to 8 mg daily at SNF (TWD = 56 mg). INR on admission 1.4 (subtherapeutic). Pharmacy has been consulted to restart warfarin therapy.   DD interactions: --Cefepime IV 3 doses -- last dose 8/10 @ [id number] -- discontinued   Date INR Warfarin Dose  8/8 -- 8 mg (PTA) 8/9 1.4 Held -- IV heparin infusion for ACS started  8/10 1.9   Goal of Therapy:  INR 2-3  Heparin level 0.3-0.7 units/ml Monitor platelets by anticoagulation protocol: Yes  0809 2136 HL 0.41, therapeutic; 1100 units/hr 0810 0631 HL 0.23, subtherapeutic; 1100 units/hr   Plan:  Warfarin: 8/9:  INR 1.4 8/10  INR 1.9 (dose not ordered) 8/11 INR 1.6 '12mg'$  8/12 INR 1.3  Decrease in INR likely d/t missed dose on 8/10. Will reorder Warfarin 12 mg again tonight(1.5 times his home dose) INR daily  Heparin: --Heparin level is subtherapeutic --Heparin 1300 unit IV bolus and increase heparin infusion to 1250 units/hr --Will recheck HL 8 hours after rate change --Daily CBC per protocol  while on IV heparin; H&H and platelets are down-trending today. Monitor closely for s/sx of bleeding --Note INR trending up (1.4 >> 1.9). Continue to follow INR. May be at increased risk of bleeding given residual warfarin effect  Pearla Dubonnet, PharmD Clinical Pharmacist   11/11/2020 1:57 PM

## 2020-11-11 NOTE — Care Management Important Message (Signed)
Important Message  Patient Details  Name: Benjamin Cordova MRN: [id number] Date of Birth: Jul 09, 1934   Medicare Important Message Given:  Yes     Dannette Barbara 11/11/2020, 1:27 PM

## 2020-11-11 NOTE — TOC Progression Note (Signed)
Transition of Care [facility]) - Progression Note    Patient Details  Name: LAWERENCE PRUDEN MRN: [id number] Date of Birth: 1934-04-17  Transition of Care [facility]) CM/SW Contact  Beverly Sessions, RN Phone Number: 11/11/2020, 3:49 PM  Clinical Narrative:     Phoebe Perch completed Per MD anticipated DC Sunday/Monday Notified Tanya at [facility].  She is to start [facility] Per Lavella Lemons she is not sure about bed availability for Sunday  Expected Discharge Plan: [place] Barriers to Discharge: Continued Medical Work up  Expected Discharge Plan and Services Expected Discharge Plan: [place]       Living arrangements for the past 2 months: [place]                                       Social Determinants of Health (SDOH) Interventions    Readmission Risk Interventions Readmission Risk Prevention Plan 11/10/2020 10/11/2020 07/04/2019  Transportation Screening Complete Complete Complete  PCP or Specialist Appt within 3-5 Days - Complete Not Complete  HRI or Home Care Consult Complete Complete Complete  Social Work Consult for [place] Planning/Counseling Complete Complete Complete  Palliative Care Screening - Not Applicable Not Applicable  Medication Review Press photographer) Complete Complete Complete  Some recent data might be hidden

## 2020-11-11 NOTE — Progress Notes (Signed)
Progress Note  Patient Name: Benjamin Cordova Date of Encounter: 11/11/2020  [facility] Cardiologist: Rozann Lesches, MD   Subjective   Very somnolent this morning, son Benjamin Cordova, Benjamin Cordova. at bedside.  Appears frail, soft-spoken.   Inpatient Medications    Scheduled Meds:  Chlorhexidine Gluconate Cloth  6 each Topical Daily   finasteride  5 mg Oral Daily   furosemide  40 mg Intravenous BID   guaiFENesin  600 mg Oral BID   metoprolol succinate  50 mg Oral Daily   senna-docusate  1 tablet Oral QHS   simvastatin  40 mg Oral QHS   spironolactone  12.5 mg Oral Daily   tamsulosin  0.4 mg Oral q AM   warfarin  12 mg Oral ONCE-1600   Warfarin - Pharmacist Dosing Inpatient   Does not apply q1600   Continuous Infusions:  heparin 1,500 Units/hr (11/11/20 0705)   PRN Meds: acetaminophen **OR** acetaminophen, ipratropium-albuterol, ondansetron **OR** ondansetron (ZOFRAN) IV, polyethylene glycol   Vital Signs    Vitals:   11/11/20 0753 11/11/20 0857 11/11/20 0958 11/11/20 1159  BP: 118/76   101/64  Pulse: 98   93  Resp: 20  (!) 23 17  Temp: 98.7 F (37.1 C)   98.2 F (36.8 C)  TempSrc: Oral     SpO2: 97% 91%  100%  Weight:        Intake/Output Summary (Last 24 hours) at 11/11/2020 1256 Last data filed at 11/11/2020 0705 Gross per 24 hour  Intake 588.27 ml  Output 2000 ml  Net -1411.73 ml   Last 3 Weights 11/11/2020 11/10/2020 10/28/2020  Weight (lbs) 221 lb 1.9 oz 221 lb 6.4 oz 232 lb 12.9 oz  Weight (kg) 100.3 kg 100.426 kg 105.6 kg      Telemetry    Sinus rhythm heart rate 89- Personally Reviewed  ECG     - Personally Reviewed  Physical Exam   GEN: No acute distress.  Appears frail, soft-spoken Neck: No JVD Cardiac: RRR Respiratory: Clear to auscultation bilaterally. GI: Soft, nontender, non-distended  MS: No edema; No deformity. Neuro:  Nonfocal  Psych: Normal affect   Labs    High Sensitivity Troponin:   Recent Labs  Lab 11/08/20 0935  11/08/20 1120 11/08/20 1533 11/08/20 1813 11/10/20 1451  TROPONINIHS 449* 841* 2,805* 3,275* 861*      Chemistry Recent Labs  Lab 11/08/20 0935 11/09/20 0631 11/10/20 0528 11/11/20 0507  NA 140 142 142 141  K 5.4* 4.7 4.3 4.8  CL 95* 97* 98 93*  CO2 33* 35* 40* 39*  GLUCOSE 150* 109* 132* 123*  BUN 35* 41* 42* 39*  CREATININE 0.89 0.89 0.97 0.90  CALCIUM 9.4 9.1 8.9 9.0  PROT 7.6  --   --   --   ALBUMIN 3.8  --   --   --   AST 46*  --   --   --   ALT 31  --   --   --   ALKPHOS 67  --   --   --   BILITOT 0.6  --   --   --   GFRNONAA >60 >60 >60 >60  ANIONGAP 12 10 4* 9     Hematology Recent Labs  Lab 11/09/20 0631 11/10/20 0528 11/11/20 0507  WBC 9.2 8.3 12.3*  RBC 3.34* 3.37* 3.44*  HGB 10.2* 10.6* 10.4*  HCT 32.9* 33.6* 33.8*  MCV 98.5 99.7 98.3  MCH 30.5 31.5 30.2  MCHC 31.0 31.5 30.8  RDW 13.3 13.6 13.1  PLT 174 185 180    BNP Recent Labs  Lab 11/08/20 0935  BNP 1,087.0*     DDimer No results for input(s): DDIMER in the last 168 hours.   Radiology    DG Chest 1 View  Result Date: 11/10/2020 CLINICAL DATA:  Shortness of breath EXAM: CHEST  1 VIEW COMPARISON:  11/08/2020 chest x-ray and CT FINDINGS: Post sternotomy changes. Moderate bilateral pleural effusions increased compared to prior. Worsening airspace disease at the bases. Cardiomegaly with vascular congestion and probable pulmonary edema. Aortic atherosclerosis. Right lower rib fracture. IMPRESSION: Cardiomegaly with vascular congestion and probable pulmonary edema. Increasing moderate bilateral pleural effusions and worsening basilar airspace disease Electronically Signed   By: Donavan Foil M.D.   On: 11/10/2020 21:46    Cardiac Studies   TTEcho 10/07/2020 1. LVEF is severely depressed with severe hypokineis of the mid/distal  ventricle; distal anterior, dsital inferior and apical akinesis. Note apex  is not well seen in this bedside study COmpared to 2021 no significant  change . Left  ventricular ejection  fraction, by estimation, is 20%%. The left ventricular internal cavity  size was severely dilated. Left ventricular diastolic parameters are  indeterminate.   2. Right ventricular systolic function is normal. The right ventricular  size is normal. There is normal pulmonary artery systolic pressure.   3. Trivial mitral valve regurgitation.   4. The aortic valve is tricuspid. Aortic valve regurgitation is not  visualized. Mild aortic valve sclerosis is present, with no evidence of  aortic valve stenosis.   Patient Profile     86 y.o. male with history of CAD/CABG, redo CABG, ischemic cardiomyopathy last EF 20%, LV thrombus presenting with cough and shortness of breath, diagnosed with pneumonia.  Being seen for cardiomyopathy/CHF.  Assessment & Plan    Ischemic cardiomyopathy, EF 20% -Continue gentle diuresing, IV Lasix  -PTA Toprol-XL, Aldactone. -Low tenuous blood pressure, somnolence preventing addition of GDMT.  2.  NSTEMI, CAD/CABG -Frailty, comorbidities, not candidate for left heart cath -S/p Heparin drip x48 hours. -Coumadin due to history of LV thrombus  3.  History of LV thrombus -PTA Coumadin, bridged with heparin per pharmacy  4.  Pneumonia -Antibiotics as per primary team  Overall patient is frail, soft-spoken, frequent hospitalizations for CHF over the past year.  Recommend goals of care discussions/palliative care input.  Total encounter time 35 minutes  Greater than 50% was spent in counseling and coordination of care with patient and son at bedside.     Signed, Kate Sable, MD  11/11/2020, 12:56 PM

## 2020-11-11 NOTE — Evaluation (Signed)
Occupational Therapy Evaluation Patient Details Name: Benjamin Cordova MRN: [id number] DOB: [birth year] Today's Date: 11/11/2020    History of Present Illness Benjamin Cordova is a 86 y.o. male with PMH significant for HTN, stroke, PAD, CAD/CABG, ischemic cardiomyopathy, paroxysmal A. fib, history of LV mural thrombus.  Patient was brought to ED from [facility] on 8/9 for evaluation of altered mental status and hypoxia.  Nursing home staff found the patient unresponsive with a oxygen saturation of 85% on room air.  He was placed on 15 L of oxygen by nonrebreather mask with improvement in oxygen saturation and sent to ED.     In the ED, blood gas showed an uncompensated respiratory acidosis with pH of 7.3, PCO2 elevated at 79 and hence patient was placed on BiPAP. Pt with elevated troponin on heparin drip, secure chat sent to MD and cleared for participation.   Clinical Impression   Pt seen for OT evaluation this date in setting of acute hospitalization d/t altered mental status and hypoxia. Pt and family report that before last hospitalization in which pt had fallen at home (~3WA) that pt was MOD I for fxl mobility and ADLs (used stand up walker and lift chair). Pt presents this date with decreased fxl activity tolerance, strength and balance. On ADL assessment this date, he requires:  MIN A for seated UB ADLs (mostly for sitting balance support as well) and requires MAX to TOTAL A for LB ADLs sitting or at bed level. Pt requires MOD/MAX A with HOB elevated to come to EOB sitting, and with P static sitting balance, he is not approporiate to attempt transfers with at this time. Pt is engaged in below-listed exercise to improve strength of structures surrounding lungs. Pt requires MIN/MOD A to get back to bed and requires MIN/MOD A +2 for repositioning in bed. Pt left with all needs met and in reach. Ed with pt and family re: OT role and rationale for OOB activity. Will continue to follow. Anticipate pt  will require f/u OT services in STR setting.     Follow Up Recommendations  SNF    Equipment Recommendations  Other (comment) (defer to next level of care)    Recommendations for Other Services       Precautions / Restrictions Precautions Precautions: Fall Restrictions Weight Bearing Restrictions: No      Mobility Bed Mobility Overal bed mobility: Needs Assistance Bed Mobility: Supine to Sit;Sit to Supine     Supine to sit: Mod assist;Max assist;HOB elevated Sit to supine: Mod assist   General bed mobility comments: increased time and effort, spO2 assessed throughout and stable on 1Lnc (actually better in upright sitting, was ~89-91% on 1L resting in bed)    Transfers                 General transfer comment: deferred, not safe at this time    Balance Overall balance assessment: Needs assistance Sitting-balance support: Bilateral upper extremity supported;Feet supported Sitting balance-Leahy Scale: Poor Sitting balance - Comments: L lateral lean, kyphotic posture, increased time and effort, requires B UE support, with attempts to eliminate UE support, he then requires external support to sustain static sitting.       Standing balance comment: deferred                           ADL either performed or assessed with clinical judgement   ADL Overall ADL's : Needs assistance/impaired  General ADL Comments: requires MIN A for seated UB ADLs (mostly for sitting balance support as well) and requires MAX to TOTAL A for LB ADLs sitting or at bed level. Pt requires MOD/MAX A with HOB elevated to come to EOB sitting, and with P static sitting balance, he is not approporiate to attempt transfers with at this time.     Vision Patient Visual Report: No change from baseline       Perception     Praxis      Pertinent Vitals/Pain Pain Assessment: Faces Faces Pain Scale: Hurts a little bit Pain  Location: with mobilization, pt is grimacing, but doesn't specify. Pain Descriptors / Indicators: Grimacing Pain Intervention(s): Limited activity within patient's tolerance;Monitored during session     Hand Dominance Right   Extremity/Trunk Assessment Upper Extremity Assessment Upper Extremity Assessment: Generalized weakness;Overall WFL for tasks assessed (ROM mostly WFL (shld to 3/4 range), MMT grossly 4-/5 throghout.)   Lower Extremity Assessment Lower Extremity Assessment: Generalized weakness;Defer to PT evaluation (R LE smaller than L LE d/t h/o polio, decreased ankle DF/PF on R side.)   Cervical / Trunk Assessment Cervical / Trunk Assessment: Kyphotic   Communication Communication Communication: No difficulties;HOH   Cognition Arousal/Alertness: Lethargic Behavior During Therapy: WFL for tasks assessed/performed Overall Cognitive Status: Within Functional Limits for tasks assessed                                 General Comments: Pt is somewhat drowsy throughout session intermittently. He is A&Ox3 (not necessarily oriented to situaation) and perseverates on needing to poop.   General Comments       Exercises Other Exercises Other Exercises: OT ed with pt and family members re: importance of OOB activity and role of OT. Other Exercises: While in EOB sitting, OT engages in pt 2 sets x5 reps scap retraction with postural extension with ~2 sec hold to improve surface area for quality of breaths. requires increased time and MIN verbal/tactile cues for form, pace, technique. Pt's family ed on rationale.   Shoulder Instructions      Home Living Family/patient expects to be discharged to:: Private residence Living Arrangements: Alone Available Help at Discharge: Available PRN/intermittently Type of Home: House Home Access: Stairs to enter [employer] of Steps: 3 Entrance Stairs-Rails: Left Home Layout: One level     Bathroom Shower/Tub: Walk-in  shower;Door   Bathroom Toilet: Handicapped height Bathroom Accessibility: No   Home Equipment: Environmental consultant - 4 wheels;Walker - 2 wheels;Grab bars - tub/shower;Shower seat - built in;Shower seat;Bedside commode;Hand held shower head   Additional Comments: recently has been at [facility] recently since hospital dc at 7/29.      Prior Functioning/Environment Level of Independence: Independent with assistive device(s)        Comments: previously was using B platform RW due to R ankle weakness and was ambulatory in home environment. Reports he hasn't been ambulatory in past few weeks while at rehab.        OT Problem List: Decreased strength;Decreased range of motion;Decreased activity tolerance;Impaired balance (sitting and/or standing);Decreased cognition;Cardiopulmonary status limiting activity      OT Treatment/Interventions: Self-care/ADL training;DME and/or AE instruction;Therapeutic activities;Balance training;Therapeutic exercise;Energy conservation;Patient/family education    OT Goals(Current goals can be found in the care plan section) Acute Rehab OT Goals Patient Stated Goal: to feel better OT Goal Formulation: With patient/family Time For Goal Achievement: 11/25/20 Potential to Achieve Goals: Fair  ADL Goals Pt Will Transfer to Toilet: with mod assist;stand pivot transfer;bedside commode Pt Will Perform Toileting - Clothing Manipulation and hygiene: with mod assist;with max assist;sitting/lateral leans Pt/caregiver will Perform Home Exercise Program: Increased strength;Both right and left upper extremity;With minimal assist  OT Frequency: Min 1X/week   Barriers to D/C:            Co-evaluation              AM-PAC OT "6 Clicks" Daily Activity     Outcome Measure Help from another person eating meals?: A Little Help from another person taking care of personal grooming?: A Little Help from another person toileting, which includes using toliet, bedpan,  or urinal?: A Lot Help from another person bathing (including washing, rinsing, drying)?: A Lot Help from another person to put on and taking off regular upper body clothing?: A Lot Help from another person to put on and taking off regular lower body clothing?: Total 6 Click Score: 13   End of Session Equipment Utilized During Treatment: Gait belt Nurse Communication: Mobility status  Activity Tolerance: Patient tolerated treatment well Patient left: in bed;with call bell/phone within reach;with bed alarm set;with family/visitor present  OT Visit Diagnosis: Unsteadiness on feet (R26.81);Muscle weakness (generalized) (M62.81)                Time: 7573-2256 OT Time Calculation (min): 24 min Charges:  OT General Charges $OT Visit: 1 Visit OT Evaluation $OT Eval Moderate Complexity: 1 Mod OT Treatments $Therapeutic Exercise: 8-22 mins  Gerrianne Scale, MS, OTR/L ascom [phone] 11/11/20, 5:38 PM

## 2020-11-11 NOTE — Progress Notes (Signed)
PROGRESS NOTE  Benjamin Cordova  DOB: 1935/01/24  PCP: Pablo Lawrence, NP [phone]  DOA: 11/08/2020  LOS: 3 days  Hospital Day: 4   Chief Complaint  Patient presents with   Respiratory Distress   Brief narrative: Benjamin Cordova is a 86 y.o. male with PMH significant for HTN, stroke, PAD, CAD/CABG, ischemic cardiomyopathy, paroxysmal A. fib, history of LV mural thrombus. Patient was brought to ED from [place] on 8/9 for evaluation of altered mental status and hypoxia. Nursing home staff found the patient unresponsive with a oxygen saturation of 85% on room air.  He was placed on 15 L of oxygen by nonrebreather mask with improvement in oxygen saturation and sent to ED.  In the ED, blood gas showed an uncompensated respiratory acidosis with pH of 7.3, PCO2 elevated at 79 and hence patient was placed on BiPAP. Labs showed potassium elevated to 5.4, bicarbonate 33, BNP elevated to more than 1000, troponin elevated to 449>> 841, lactic acid elevated to 2.1>> 2.9, WBC count elevated to 17.4 Respiratory virus panel negative CT scan of head did not show any acute abnormality.  It showed chronic cortical/subcortical infarct within the right frontal, parietal and temporal lobes. Chest x-ray showed bilateral small to moderate pleural effusion.  It also showed additional patchy ill-defined opacities in bilateral lower lung fields. Patient was admitted to hospitalist service for further evaluation management   Subjective: Patient was seen and examined this morning.  Lying on bed.  Not in distress.  On low-flow oxygen.  Son at bedside.  Assessment/Plan: Acute on chronic systolic heart failure -Echo from 10/07/2020 showed EF of 20%, severe hypokinesis and severe dilation of the LV.   -Currently on metoprolol succinate 50 mg daily, Lasix IV 40 mg daily -Net IO Since Admission: -3,082.14 mL [11/11/20 1521] -Noted a plan from cardiology to increase Lasix to 40 mg IV twice daily.  Also on  metoprolol and Aldactone. -Continue to monitor for daily intake output, weight, blood pressure, BNP, renal function and electrolytes. Recent Labs  Lab 11/08/20 0935 11/09/20 0631 11/10/20 0528 11/11/20 0507  BNP 1,087.0*  --   --   --   BUN 35* 41* 42* 39*  CREATININE 0.89 0.89 0.97 0.90  K 5.4* 4.7 4.3 4.8  MG 2.2  --  2.5*  --     Acute on chronic respiratory failure with hypoxia and hypercapnia -Brought from nursing facility after he was noted to be unresponsive with O2 sat low at 55%. -Probably related to acute exacerbation of CHF. -Initially required BiPAP.  Currently on 3lpm oxygen by nasal cannula.  Wean down as tolerated.  Hyperkalemia -Potassium level has now improved to normal. Recent Labs  Lab 11/08/20 0935 11/09/20 0631 11/10/20 0528 11/11/20 0507  K 5.4* 4.7 4.3 4.8  MG 2.2  --  2.5*  --   PHOS  --   --  3.8  --     SIRS -POA Lactic acidosis -Presented with hypoxia, elevated WBC count, elevated lactic acid level. -However, no clear evidence of pneumonia in the imaging.  Procalcitonin level negative x2. -Empiric antibiotics were stopped. Recent Labs  Lab 11/08/20 0935 11/08/20 1120 11/08/20 1533 11/08/20 1634 11/09/20 0631 11/10/20 0528 11/11/20 0507  WBC 17.4*  --   --   --  9.2 8.3 12.3*  LATICACIDVEN 2.1* 2.9* 2.6* 2.3*  --   --  0.8  PROCALCITON <0.10  --   --   --  <0.10  --   --  Elevated troponin History of CAD/CABG, PAD -EKG without any ST-T wave changes. -Troponin elevated, trend as below.  Initially placed on heparin drip -Cardiology consultation was obtained.  In the absence of chest pain, shortness of breath, ischemic evaluation was not pursued.  -Continue simvastatin Recent Labs    11/08/20 1533 11/08/20 1813 11/10/20 1451  TROPONINIHS 2,805* 3,275* 861*     History of paroxysmal atrial fibrillation/LV thrombus -Patient was on Coumadin with subtherapeutic INR -Continue heparin drip until INR becomes therapeutic -Check  daily PT/INR Recent Labs  Lab 11/08/20 0935 11/09/20 0631 11/10/20 0528 11/11/20 0507  INR 1.4* 1.9* 1.6* 1.3*    BPH -Continue finasteride and Flomax.  -Has a Foley catheter in place.  Per nursing staff he was placed on 7/24.  Acute metabolic encephalopathy -Altered mental status resolved.   -Alert, awake, oriented x3 this morning.  Mobility: Encourage ambulation.  Pending PT eval. Code Status:   Code Status: DNR  Nutritional status: Body mass index is 33.62 kg/m.     Diet:  Diet Order             Diet Heart Room service appropriate? Yes; Fluid consistency: Thin  Diet effective now                  DVT prophylaxis: Heparin drip bridge with Coumadin  warfarin (COUMADIN) tablet 12 mg   Antimicrobials: Not on antibiotics Fluid: None Consultants: Cardiology Family Communication: None at bedside  Status is: Inpatient  Remains inpatient appropriate because: Continues IV diuresis  Dispo: The patient is from: [facility] facility              Anticipated d/c is to: Back to nursing facility in next 2 to 3 days.              Patient currently is not medically stable to d/c.   Difficult to place patient No     Infusions:   heparin 1,500 Units/hr (11/11/20 1520)    Scheduled Meds:  Chlorhexidine Gluconate Cloth  6 each Topical Daily   finasteride  5 mg Oral Daily   furosemide  40 mg Intravenous BID   guaiFENesin  600 mg Oral BID   metoprolol succinate  50 mg Oral Daily   senna-docusate  1 tablet Oral QHS   simvastatin  40 mg Oral QHS   spironolactone  12.5 mg Oral Daily   tamsulosin  0.4 mg Oral q AM   warfarin  12 mg Oral ONCE-1600   Warfarin - Pharmacist Dosing Inpatient   Does not apply q1600    Antimicrobials: Anti-infectives (From admission, onward)    Start     Dose/Rate Route Frequency Ordered Stop   11/09/20 1600  vancomycin (VANCOREADY) IVPB 1500 mg/300 mL  Status:  Discontinued        1,500 mg 150 mL/hr over 120 Minutes  Intravenous Every 24 hours 11/08/20 1446 11/09/20 1304   11/08/20 1600  ceFEPIme (MAXIPIME) 2 g in sodium chloride 0.9 % 100 mL IVPB  Status:  Discontinued        2 g 200 mL/hr over 30 Minutes Intravenous Every 8 hours 11/08/20 1403 11/09/20 1304   11/08/20 1400  vancomycin (VANCOCIN) IVPB 1000 mg/200 mL premix        1,000 mg 200 mL/hr over 60 Minutes Intravenous  Once 11/08/20 1357 11/08/20 1619   11/08/20 1400  ceFEPIme (MAXIPIME) 2 g in sodium chloride 0.9 % 100 mL IVPB  Status:  Discontinued  2 g 200 mL/hr over 30 Minutes Intravenous  Once 11/08/20 1357 11/08/20 1403   11/08/20 1000  ceFEPIme (MAXIPIME) 2 g in sodium chloride 0.9 % 100 mL IVPB        2 g 200 mL/hr over 30 Minutes Intravenous  Once 11/08/20 0957 11/08/20 1117   11/08/20 1000  vancomycin (VANCOCIN) IVPB 1000 mg/200 mL premix        1,000 mg 200 mL/hr over 60 Minutes Intravenous  Once 11/08/20 0957 11/08/20 1242       PRN meds: acetaminophen **OR** acetaminophen, ipratropium-albuterol, ondansetron **OR** ondansetron (ZOFRAN) IV, polyethylene glycol   Objective: Vitals:   11/11/20 0958 11/11/20 1159  BP:  101/64  Pulse:  93  Resp: (!) 23 17  Temp:  98.2 F (36.8 C)  SpO2:  100%    Intake/Output Summary (Last 24 hours) at 11/11/2020 1521 Last data filed at 11/11/2020 1520 Gross per 24 hour  Intake 738.63 ml  Output 1000 ml  Net -261.37 ml    Filed Weights   11/10/20 0937 11/11/20 0332  Weight: 100.4 kg 100.3 kg   Weight change:  Body mass index is 33.62 kg/m.   Physical Exam: General exam: Pleasant, elderly Caucasian male.  Not in physical distress Skin: No rashes, lesions or ulcers. HEENT: Atraumatic, normocephalic, no obvious bleeding Lungs: Clear to auscultation bilaterally CVS: Regular rate and rhythm, no murmur GI/Abd soft, nontender, nondistended, bowel sound present CNS: Alert, awake, oriented x3 Psychiatry: Mood appropriate Extremities: Trace bilateral pedal edema, no calf  tenderness  Data Review: I have personally reviewed the laboratory data and studies available.  Recent Labs  Lab 11/08/20 0935 11/09/20 0631 11/10/20 0528 11/11/20 0507  WBC 17.4* 9.2 8.3 12.3*  NEUTROABS 15.3*  --   --   --   HGB 12.0* 10.2* 10.6* 10.4*  HCT 38.1* 32.9* 33.6* 33.8*  MCV 99.2 98.5 99.7 98.3  PLT 300 174 185 180    Recent Labs  Lab 11/08/20 0935 11/09/20 0631 11/10/20 0528 11/11/20 0507  NA 140 142 142 141  K 5.4* 4.7 4.3 4.8  CL 95* 97* 98 93*  CO2 33* 35* 40* 39*  GLUCOSE 150* 109* 132* 123*  BUN 35* 41* 42* 39*  CREATININE 0.89 0.89 0.97 0.90  CALCIUM 9.4 9.1 8.9 9.0  MG 2.2  --  2.5*  --   PHOS  --   --  3.8  --      F/u labs ordered Unresulted Labs (From admission, onward)     Start     Ordered   11/11/20 1500  Heparin level (unfractionated)  Once-Timed,   TIMED       Question:  Specimen collection method  Answer:  Lab=Lab collect   11/11/20 0650   11/10/20 0500  Protime-INR  Daily,   R      11/09/20 1030   11/10/20 [id number]  Basic metabolic panel  Daily,   R      11/09/20 1441   11/09/20 2200  Urine Culture  Once,   AD        11/09/20 2200            Signed, Terrilee Croak, MD [facility] 11/11/2020

## 2020-11-11 NOTE — Progress Notes (Signed)
Physical Therapy Treatment Patient Details Name: Benjamin Cordova MRN: [id number] DOB: 1935-02-10 Today's Date: 11/11/2020    History of Present Illness Benjamin Cordova is a 86 y.o. male with PMH significant for HTN, stroke, PAD, CAD/CABG, ischemic cardiomyopathy, paroxysmal A. fib, history of LV mural thrombus.  Patient was brought to ED from [place] on 8/9 for evaluation of altered mental status and hypoxia.  Nursing home staff found the patient unresponsive with a oxygen saturation of 85% on room air.  He was placed on 15 L of oxygen by nonrebreather mask with improvement in oxygen saturation and sent to ED.     In the ED, blood gas showed an uncompensated respiratory acidosis with pH of 7.3, PCO2 elevated at 79 and hence patient was placed on BiPAP. Pt with elevated troponin on heparin drip, secure chat sent to MD and cleared for participation.    PT Comments    Pt is making gradual progress towards goals, very lethargic this date. RN reports she just removed bipap and currently on 1L of O2 via [place]. O2 sats vary throughout session with exertion ranging from 86%-88%. With cues, pt able to perform pursed lip breathing with sats at 96%. Will continue to progress as able.   Follow Up Recommendations  SNF     Equipment Recommendations  None recommended by PT    Recommendations for Other Services       Precautions / Restrictions Precautions Precautions: Fall Restrictions Weight Bearing Restrictions: No    Mobility  Bed Mobility               General bed mobility comments: not safe to attempt this date due to resp    Transfers                    Ambulation/Gait                 Stairs             Wheelchair Mobility    Modified Rankin (Stroke Patients Only)       Balance                                            Cognition Arousal/Alertness: Lethargic Behavior During Therapy: WFL for tasks assessed/performed Overall  Cognitive Status: Within Functional Limits for tasks assessed                                 General Comments: sleepy, however arouses to touch. Agreeable to session, endorces feelings of unwell      Exercises Other Exercises Other Exercises: supine ther-ex performed on B LE including SLR, hip abd/add, hip add squeezes, heel slides, SAQ, assisted sit ups with B UE on bed railing. 10 reps performed with O2 monitored throughout and rest breaks as needed.    General Comments        Pertinent Vitals/Pain Pain Assessment: No/denies pain (making groans throughout session, but appears to be related to WOB)    Home Living                      Prior Function            PT Goals (current goals can now be found in the care plan section) Acute Rehab PT Goals  Patient Stated Goal: to feel better PT Goal Formulation: With patient Time For Goal Achievement: 11/24/20 Potential to Achieve Goals: Good Progress towards PT goals: Progressing toward goals    Frequency    Min 2X/week      PT Plan Current plan remains appropriate    Co-evaluation              AM-PAC PT "6 Clicks" Mobility   Outcome Measure  Help needed turning from your back to your side while in a flat bed without using bedrails?: A Little Help needed moving from lying on your back to sitting on the side of a flat bed without using bedrails?: A Lot Help needed moving to and from a bed to a chair (including a wheelchair)?: Total Help needed standing up from a chair using your arms (e.g., wheelchair or bedside chair)?: Total Help needed to walk in hospital room?: Total Help needed climbing 3-5 steps with a railing? : Total 6 Click Score: 9    End of Session Equipment Utilized During Treatment: Oxygen Activity Tolerance: Patient limited by fatigue Patient left: in bed;with bed alarm set Nurse Communication: Mobility status PT Visit Diagnosis: Unsteadiness on feet (R26.81);History of  falling (Z91.81);Difficulty in walking, not elsewhere classified (R26.2);Muscle weakness (generalized) (M62.81) Pain - Right/Left: Right     Time: [id number] PT Time Calculation (min) (ACUTE ONLY): 16 min  Charges:  $Therapeutic Exercise: 8-22 mins                     Benjamin Cordova, PT, DPT [phone]    Benjamin Cordova 11/11/2020, 3:27 PM

## 2020-11-11 NOTE — Progress Notes (Signed)
Patient care taken over by this RN. Resting in bed with family at bedside.

## 2020-11-11 NOTE — Consult Note (Signed)
[place] for IV heparin/warfarin Indication: chest pain/ACS; Atrial fibrillation  Patient Measurements: Heparin Dosing Weight: 91 kg  Labs: Recent Labs    11/08/20 1813 11/08/20 2136 11/09/20 0631 11/09/20 2011 11/10/20 0528 11/10/20 1451 11/11/20 0507 11/11/20 1457  HGB  --    < > 10.2*  --  10.6*  --  10.4*  --   HCT  --   --  32.9*  --  33.6*  --  33.8*  --   PLT  --   --  174  --  185  --  180  --   LABPROT  --   --  22.0*  --  18.9*  --  16.5*  --   INR  --   --  1.9*  --  1.6*  --  1.3*  --   HEPARINUNFRC  --    < > 0.23*   < > 0.32  --  0.16* 0.49  CREATININE  --   --  0.89  --  0.97  --  0.90  --   TROPONINIHS 3,275*  --   --   --   --  861*  --   --    < > = values in this interval not displayed.     Estimated Creatinine Clearance: 71.4 mL/min (by C-G formula based on SCr of 0.9 mg/dL).   Medical History: Past Medical History:  Diagnosis Date   Arthritis    Coronary artery disease    a. s/p CABG in 1981 b. cath in 10/2014 showing occluded grafts --> redo CABG in 2016 LIMA-LAD and Left Radial-OM2   Essential hypertension    Hematuria    History of blood transfusion 07/2015   Knee replacement HgB <9   History of pneumonia    History of stroke    Ischemic cardiomyopathy    Left ventricular apical thrombus    Mixed hyperlipidemia    Myocardial infarction [facility]) 1980   Peripheral vascular disease ([place])    Polio osteopathy of lower leg ([place]) Age 86   Affected right leg   Skin cancer    TIA (transient ischemic attack) 1989   Chronic coumadin   UTI (lower urinary tract infection)     Medications:  Warfarin 8 mg daily prior to admission (last PTA dose 8/8) Prior chronic regimen was 10 mg PO 6 days/week, and 5 mg daily on Fridays (TWD = 65 mg), during recent hospital admission 10/23/20 - 10/28/20, patient received 10 mg daily. Discharge orders to SNF where warfarin dose was decreased to 7.5 mg daily starting 10/29/2020 with  discharge orders for INR check on 10/31/20. Dose was recently increased to 8 mg daily at SNF (TWD = 56 mg)    Assessment: Patient is an 86 y/o M with medical history as above and including CAD s/p CABG, PVD, LV thrombus, TIA / CVA who was BIBEMS from [facility] for unresponsiveness and respiratory failure. Troponin elevated to 449. Pharmacy has been consulted to initiate heparin infusion for suspected ACS.  Baseline H&H, platelets acceptable. Baseline INR 1.4 (on warfarin as outpatient), aPTT 35.   Warfarin: Patient on chronic warfarin therapy for paroxymal atrial fibrillation, recurrent TIAs, and LV thrombus. Prior chronic regimen was 10 mg PO 6 days/week, and 5 mg daily on Fridays (TWD = 65 mg), during recent hospital admission 10/23/20 - 10/28/20, patient received 10 mg daily. Discharge orders to SNF where warfarin dose was decreased to 7.5 mg daily starting 10/29/2020 with discharge orders  for INR check on 10/31/20. Dose was recently increased to 8 mg daily at SNF (TWD = 56 mg). INR on admission 1.4 (subtherapeutic). Pharmacy has been consulted to restart warfarin therapy.   DD interactions: --Cefepime IV 3 doses -- last dose 8/10 @ [id number] -- discontinued   Date INR Warfarin Dose  8/8 -- 8 mg (PTA) 8/9 1.4 Held -- IV heparin infusion for ACS started  8/10 1.9   Goal of Therapy:  INR 2-3  Heparin level 0.3-0.7 units/ml Monitor platelets by anticoagulation protocol: Yes  0809 2136 HL 0.41, therapeutic; 1100 units/hr 0810 0631 HL 0.23, subtherapeutic; 1100 units/hr 0810 2011 HL 0.31, therapeutic; 1250 units/hr 0811 0528 HL 0.31, therapeutic; 1250 units/hr 0812 0507 HL 0.16, subtherapeutic 0812 1457 HL 0.49, therapeutic; 1500 units/hr   Plan:  Warfarin: 8/9:  INR 1.4 8/10  INR 1.9 (dose not ordered) 8/11 INR 1.6 '12mg'$  8/12 INR 1.3  Decrease in INR likely d/t missed dose on 8/10. Will reorder Warfarin 12 mg again tonight(1.5 times his home dose) INR daily  Heparin: --Heparin  level is therapeutic --Continue heparin at current rate of 1500 units/hr  --Will recheck HL in 8 hours to confirm --Daily CBC per protocol while on IV heparin; H&H and platelets are down-trending today. Monitor closely for s/sx of bleeding  Sherilyn Banker, PharmD Clinical Pharmacist   11/11/2020 4:16 PM

## 2020-11-12 DIAGNOSIS — I5023 Acute on chronic systolic (congestive) heart failure: Secondary | ICD-10-CM | POA: Diagnosis not present

## 2020-11-12 LAB — BASIC METABOLIC PANEL
Anion gap: 7 (ref 5–15)
BUN: 42 mg/dL — ABNORMAL HIGH (ref 8–23)
CO2: 43 mmol/L — ABNORMAL HIGH (ref 22–32)
Calcium: 8.8 mg/dL — ABNORMAL LOW (ref 8.9–10.3)
Chloride: 89 mmol/L — ABNORMAL LOW (ref 98–111)
Creatinine, Ser: 0.91 mg/dL (ref 0.61–1.24)
GFR, Estimated: >60 mL/min (ref >60)
Glucose, Bld: 110 mg/dL — ABNORMAL HIGH (ref 70–99)
Potassium: 4.2 mmol/L (ref 3.5–5.1)
Sodium: 139 mmol/L (ref 135–145)

## 2020-11-12 LAB — CBC
HCT: 30 % — ABNORMAL LOW (ref 39.0–52.0)
Hemoglobin: 9.2 g/dL — ABNORMAL LOW (ref 13.0–17.0)
MCH: 30.7 pg (ref 26.0–34.0)
MCHC: 30.7 g/dL (ref 30.0–36.0)
MCV: 100 fL (ref 80.0–100.0)
Platelets: 143 10*3/uL — ABNORMAL LOW (ref 150–400)
RBC: 3 MIL/uL — ABNORMAL LOW (ref 4.22–5.81)
RDW: 13.2 % (ref 11.5–15.5)
WBC: 8 10*3/uL (ref 4.0–10.5)
nRBC: 0 % (ref 0.0–0.2)

## 2020-11-12 LAB — HEPARIN LEVEL (UNFRACTIONATED)
Heparin Unfractionated: 0.28 IU/mL — ABNORMAL LOW (ref 0.30–0.70)
Heparin Unfractionated: 0.5 IU/mL (ref 0.30–0.70)
Heparin Unfractionated: 0.55 IU/mL (ref 0.30–0.70)

## 2020-11-12 LAB — PROTIME-INR
INR: 1.8 — ABNORMAL HIGH (ref 0.8–1.2)
Prothrombin Time: 20.8 seconds — ABNORMAL HIGH (ref 11.4–15.2)

## 2020-11-12 MED ORDER — WARFARIN SODIUM 4 MG PO TABS
8.0000 mg | ORAL_TABLET | Freq: Once | ORAL | Status: AC
  Administered 2020-11-12: 8 mg via ORAL
  Filled 2020-11-12: qty 2

## 2020-11-12 MED ORDER — HEPARIN BOLUS VIA INFUSION
1400.0000 [IU] | Freq: Once | INTRAVENOUS | Status: AC
  Administered 2020-11-12: 1400 [IU] via INTRAVENOUS
  Filled 2020-11-12: qty 1400

## 2020-11-12 NOTE — Consult Note (Signed)
[place] for IV heparin/warfarin Indication: chest pain/ACS; Atrial fibrillation  Patient Measurements: Heparin Dosing Weight: 91 kg  Labs: Recent Labs    11/10/20 0528 11/10/20 1451 11/11/20 0507 11/11/20 1457 11/11/20 2229 11/12/20 0420  HGB 10.6*  --  10.4*  --   --  9.2*  HCT 33.6*  --  33.8*  --   --  30.0*  PLT 185  --  180  --   --  143*  LABPROT 18.9*  --  16.5*  --   --  20.8*  INR 1.6*  --  1.3*  --   --  1.8*  HEPARINUNFRC 0.32  --  0.16* 0.49 0.33 0.28*  CREATININE 0.97  --  0.90  --   --  0.91  TROPONINIHS  --  861*  --   --   --   --      Estimated Creatinine Clearance: 70.6 mL/min (by C-G formula based on SCr of 0.91 mg/dL).   Medical History: Past Medical History:  Diagnosis Date   Arthritis    Coronary artery disease    a. s/p CABG in 1981 b. cath in 10/2014 showing occluded grafts --> redo CABG in 2016 LIMA-LAD and Left Radial-OM2   Essential hypertension    Hematuria    History of blood transfusion 07/2015   Knee replacement HgB <9   History of pneumonia    History of stroke    Ischemic cardiomyopathy    Left ventricular apical thrombus    Mixed hyperlipidemia    Myocardial infarction [facility]) 1980   Peripheral vascular disease ([place])    Polio osteopathy of lower leg ([place]) Age 51   Affected right leg   Skin cancer    TIA (transient ischemic attack) 1989   Chronic coumadin   UTI (lower urinary tract infection)     Medications:  Warfarin 8 mg daily prior to admission (last PTA dose 8/8) Prior chronic regimen was 10 mg PO 6 days/week, and 5 mg daily on Fridays (TWD = 65 mg), during recent hospital admission 10/23/20 - 10/28/20, patient received 10 mg daily. Discharge orders to SNF where warfarin dose was decreased to 7.5 mg daily starting 10/29/2020 with discharge orders for INR check on 10/31/20. Dose was recently increased to 8 mg daily at SNF (TWD = 56 mg)    Assessment: Patient is an 86 y/o M with medical  history as above and including CAD s/p CABG, PVD, LV thrombus, TIA / CVA who was BIBEMS from [facility] for unresponsiveness and respiratory failure. Troponin elevated to 449. Pharmacy has been consulted to initiate heparin infusion for suspected ACS.  Baseline H&H, platelets acceptable. Baseline INR 1.4 (on warfarin as outpatient), aPTT 35.   Warfarin: Patient on chronic warfarin therapy for paroxymal atrial fibrillation, recurrent TIAs, and LV thrombus. Prior chronic regimen was 10 mg PO 6 days/week, and 5 mg daily on Fridays (TWD = 65 mg), during recent hospital admission 10/23/20 - 10/28/20, patient received 10 mg daily. Discharge orders to SNF where warfarin dose was decreased to 7.5 mg daily starting 10/29/2020 with discharge orders for INR check on 10/31/20. Dose was recently increased to 8 mg daily at SNF (TWD = 56 mg). INR on admission 1.4 (subtherapeutic). Pharmacy has been consulted to restart warfarin therapy.   DD interactions: --Cefepime IV 3 doses -- last dose 8/10 @ [id number] -- discontinued   Date INR Warfarin Dose  8/8 -- 8 mg (PTA) 8/9 1.4 Held -- IV heparin  infusion for ACS started  8/10 1.9   Goal of Therapy:  INR 2-3  Heparin level 0.3-0.7 units/ml Monitor platelets by anticoagulation protocol: Yes  0809 2136 HL 0.41, therapeutic; 1100 units/hr 0810 0631 HL 0.23, subtherapeutic; 1100 units/hr 0810 2011 HL 0.31, therapeutic; 1250 units/hr 0811 0528 HL 0.31, therapeutic; 1250 units/hr 0812 0507 HL 0.16, subtherapeutic 0812 1457 HL 0.49, therapeutic; 1500 units/hr 0812 2229 HL 0.33, therapeutic, trending down 0813 0420 HL 0.28, subtherapeutic   Plan:  Warfarin: 8/9:  INR 1.4 8/10  INR 1.9 (dose not ordered) 8/11 INR 1.6 '12mg'$  8/12 INR 1.3  Decrease in INR likely d/t missed dose on 8/10. Will reorder Warfarin 12 mg again tonight(1.5 times his home dose) INR daily  Heparin: --Bolus x 1400 units --Increase heparin to rate of 1700 units/hr  --Will recheck HL in 8  hr after rate change --Daily CBC per protocol while on IV heparin; --H&H and platelets are down-trending today.  --Monitor closely for s/sx of bleeding  Renda Rolls, PharmD, [facility] 11/12/2020 6:36 AM

## 2020-11-12 NOTE — Progress Notes (Signed)
Bipap alarming when I arrived on floorl. Patient had take off mask. Patient was awake, alert, and oriented. Placed him back on o2 as patient decided he did not want to put mask back on

## 2020-11-12 NOTE — Consult Note (Signed)
[place] for IV heparin/warfarin Indication: chest pain/ACS; Atrial fibrillation  Patient Measurements: Heparin Dosing Weight: 91 kg  Labs: Recent Labs    11/10/20 0528 11/10/20 1451 11/11/20 0507 11/11/20 1457 11/11/20 2229 11/12/20 0420  HGB 10.6*  --  10.4*  --   --  9.2*  HCT 33.6*  --  33.8*  --   --  30.0*  PLT 185  --  180  --   --  143*  LABPROT 18.9*  --  16.5*  --   --  20.8*  INR 1.6*  --  1.3*  --   --  1.8*  HEPARINUNFRC 0.32  --  0.16* 0.49 0.33 0.28*  CREATININE 0.97  --  0.90  --   --  0.91  TROPONINIHS  --  861*  --   --   --   --      Estimated Creatinine Clearance: 70.6 mL/min (by C-G formula based on SCr of 0.91 mg/dL).   Medical History: Past Medical History:  Diagnosis Date   Arthritis    Coronary artery disease    a. s/p CABG in 1981 b. cath in 10/2014 showing occluded grafts --> redo CABG in 2016 LIMA-LAD and Left Radial-OM2   Essential hypertension    Hematuria    History of blood transfusion 07/2015   Knee replacement HgB <9   History of pneumonia    History of stroke    Ischemic cardiomyopathy    Left ventricular apical thrombus    Mixed hyperlipidemia    Myocardial infarction [facility]) 1980   Peripheral vascular disease ([place])    Polio osteopathy of lower leg ([place]) Age 65   Affected right leg   Skin cancer    TIA (transient ischemic attack) 1989   Chronic coumadin   UTI (lower urinary tract infection)     Medications:  Warfarin 8 mg daily prior to admission (last PTA dose 8/8) Prior chronic regimen was 10 mg PO 6 days/week, and 5 mg daily on Fridays (TWD = 65 mg), during recent hospital admission 10/23/20 - 10/28/20, patient received 10 mg daily. Discharge orders to SNF where warfarin dose was decreased to 7.5 mg daily starting 10/29/2020 with discharge orders for INR check on 10/31/20. Dose was recently increased to 8 mg daily at SNF (TWD = 56 mg)    Assessment: Patient is an 86 y/o M with medical  history as above and including CAD s/p CABG, PVD, LV thrombus, TIA / CVA who was BIBEMS from [facility] for unresponsiveness and respiratory failure. Troponin elevated to 449. Pharmacy has been consulted to initiate heparin infusion for suspected ACS.  Date INR Warfarin Dose  8/8 -- 8 mg (PTA) 8/9 1.4 Held -- IV heparin infusion for ACS started  8/10  1.9  (dose not ordered) 8/11 1.6  12 mg 8/12 1.3 12 mg  8/13 1.8  8 mg  0809 2136 HL 0.41, therapeutic; 1100 units/hr 0810 0631 HL 0.23, subtherapeutic; 1100 units/hr 0810 2011 HL 0.31, therapeutic; 1250 units/hr 0811 0528 HL 0.31, therapeutic; 1250 units/hr 0812 0507 HL 0.16, subtherapeutic 0812 1457 HL 0.49, therapeutic; 1500 units/hr 0812 2229 HL 0.33, therapeutic, trending down 0813 0420 HL 0.28, subtherapeutic  Goal of Therapy:  INR 2-3  Heparin level 0.3-0.7 units/ml Monitor platelets by anticoagulation protocol: Yes  Warfarin: INR is subtherapeutic. Will give warfarin 8 mg x 1 (home dose). Pt received 12 mg x 2 and INR jumped 0.5. Predict INR will continue to increase due  to 12 mg dose given 8/12. Daily INR ordered.   Heparin: Heparin level was subtherapeutic. Heparin bolus of 1400 units x 1 given and heparin infusion increased to 1700 units/hr. Recheck heparin level in 8 hours. CBC daily while on heparin.   Eleonore Chiquito, PharmD, BCPS 11/12/2020 9:17 AM

## 2020-11-12 NOTE — Consult Note (Signed)
[place] for IV heparin/warfarin Indication: chest pain/ACS; Atrial fibrillation  Patient Measurements: Heparin Dosing Weight: 91 kg  Labs: Recent Labs    11/10/20 0528 11/10/20 1451 11/11/20 0507 11/11/20 1457 11/11/20 2229 11/12/20 0420 11/12/20 1526  HGB 10.6*  --  10.4*  --   --  9.2*  --   HCT 33.6*  --  33.8*  --   --  30.0*  --   PLT 185  --  180  --   --  143*  --   LABPROT 18.9*  --  16.5*  --   --  20.8*  --   INR 1.6*  --  1.3*  --   --  1.8*  --   HEPARINUNFRC 0.32  --  0.16*   < > 0.33 0.28* 0.50  CREATININE 0.97  --  0.90  --   --  0.91  --   TROPONINIHS  --  861*  --   --   --   --   --    < > = values in this interval not displayed.     Estimated Creatinine Clearance: 70.6 mL/min (by C-G formula based on SCr of 0.91 mg/dL).   Medical History: Past Medical History:  Diagnosis Date   Arthritis    Coronary artery disease    a. s/p CABG in 1981 b. cath in 10/2014 showing occluded grafts --> redo CABG in 2016 LIMA-LAD and Left Radial-OM2   Essential hypertension    Hematuria    History of blood transfusion 07/2015   Knee replacement HgB <9   History of pneumonia    History of stroke    Ischemic cardiomyopathy    Left ventricular apical thrombus    Mixed hyperlipidemia    Myocardial infarction [facility]) 1980   Peripheral vascular disease ([place])    Polio osteopathy of lower leg ([place]) Age 86   Affected right leg   Skin cancer    TIA (transient ischemic attack) 1989   Chronic coumadin   UTI (lower urinary tract infection)     Medications:  Warfarin 8 mg daily prior to admission (last PTA dose 8/8) Prior chronic regimen was 10 mg PO 6 days/week, and 5 mg daily on Fridays (TWD = 65 mg), during recent hospital admission 10/23/20 - 10/28/20, patient received 10 mg daily. Discharge orders to SNF where warfarin dose was decreased to 7.5 mg daily starting 10/29/2020 with discharge orders for INR check on 10/31/20. Dose was  recently increased to 8 mg daily at SNF (TWD = 56 mg)    Assessment: Patient is an 86 y/o M with medical history as above and including CAD s/p CABG, PVD, LV thrombus, TIA / CVA who was BIBEMS from [facility] for unresponsiveness and respiratory failure. Troponin elevated to 449. Pharmacy has been consulted to initiate heparin infusion for suspected ACS.  Date INR Warfarin Dose  8/8 -- 8 mg (PTA) 8/9 1.4 Held -- IV heparin infusion for ACS started  8/10  1.9  (dose not ordered) 8/11 1.6  12 mg 8/12 1.3 12 mg  8/13 1.8  8 mg  0809 2136 HL 0.41, therapeutic; 1100 units/hr 0810 0631 HL 0.23, subtherapeutic; 1100 units/hr 0810 2011 HL 0.31, therapeutic; 1250 units/hr 0811 0528 HL 0.31, therapeutic; 1250 units/hr 0812 0507 HL 0.16, subtherapeutic 0812 1457 HL 0.49, therapeutic; 1500 units/hr 0812 2229 HL 0.33, therapeutic, trending down 0813 0420 HL 0.28, subtherapeutic 0813 1526 HL 0.50, therapeutic x 1  Goal of Therapy:  INR 2-3  Heparin level 0.3-0.7 units/ml Monitor platelets by anticoagulation protocol: Yes  Warfarin: INR is subtherapeutic. Will give warfarin 8 mg x 1 (home dose). Pt received 12 mg x 2 and INR jumped 0.5. Predict INR will continue to increase due to 12 mg dose given 8/12. Daily INR ordered.   Heparin: Heparin level was therapeutic at 0.5. Continue heparin infusion 1700 units/hr. Recheck heparin level in 8 hours. CBC daily while on heparin.      Wynelle Cleveland, PharmD Pharmacy Resident  11/12/2020 3:50 PM

## 2020-11-12 NOTE — Progress Notes (Signed)
   11/10/20 1946  Assess: MEWS Score  Temp 98.2 F (36.8 C)  BP 136/80  Pulse Rate (!) 104  Resp (!) 30  SpO2 100 %  O2 Device Nasal Cannula  O2 Flow Rate (L/min) 3 L/min  Assess: MEWS Score  MEWS Temp 0  MEWS Systolic 0  MEWS Pulse 1  MEWS RR 2  MEWS LOC 0  MEWS Score 3  MEWS Score Color Yellow  Assess: if the MEWS score is Yellow or Red  Were vital signs taken at a resting state? Yes  Focused Assessment Change from prior assessment (see assessment flowsheet)  Does the patient meet 2 or more of the SIRS criteria? Yes  MEWS guidelines implemented *See Row Information* Yes  Treat  MEWS Interventions Consulted Respiratory Therapy;Other (Comment) (MD notified)  Pain Scale 0-10  Pain Score 0  Take Vital Signs  Increase Vital Sign Frequency  Yellow: Q 2hr X 2 then Q 4hr X 2, if remains yellow, continue Q 4hrs  Escalate  MEWS: Escalate Yellow: discuss with charge nurse/RN and consider discussing with provider and RRT  Notify: Charge Nurse/RN  Name of Charge Nurse/RN Notified [place] RN  Date Charge Nurse/RN Notified 11/10/20  Time Charge Nurse/RN Notified 1947  Assess: SIRS CRITERIA  SIRS Temperature  0  SIRS Pulse 1  SIRS Respirations  1  SIRS WBC 0  SIRS Score Sum  2  Inserted for Benjamin Bailey RN

## 2020-11-12 NOTE — Progress Notes (Signed)
Progress Note  Patient Name: Benjamin Cordova Date of Encounter: 11/12/2020  [facility] Cardiologist: Rozann Lesches, MD   Subjective   NAEO. Awake watching sports this AM.  Inpatient Medications    Scheduled Meds:  finasteride  5 mg Oral Daily   furosemide  40 mg Intravenous BID   guaiFENesin  600 mg Oral BID   metoprolol succinate  50 mg Oral Daily   senna-docusate  1 tablet Oral QHS   simvastatin  40 mg Oral QHS   spironolactone  12.5 mg Oral Daily   tamsulosin  0.4 mg Oral q AM   warfarin  8 mg Oral ONCE-1600   Warfarin - Pharmacist Dosing Inpatient   Does not apply q1600   Continuous Infusions:  heparin 1,700 Units/hr (11/12/20 0644)   PRN Meds: acetaminophen **OR** acetaminophen, ipratropium-albuterol, ondansetron **OR** ondansetron (ZOFRAN) IV, polyethylene glycol   Vital Signs    Vitals:   11/11/20 2052 11/12/20 0448 11/12/20 0731 11/12/20 1231  BP: 102/61 97/61 (!) 95/57 115/71  Pulse: (!) 110 100 86 91  Resp: '18 17 17   '$ Temp: 98.2 F (36.8 C) 98.4 F (36.9 C) 98.2 F (36.8 C) 98 F (36.7 C)  TempSrc: Oral Oral    SpO2: 94% 93% 94% 92%  Weight:      Height:        Intake/Output Summary (Last 24 hours) at 11/12/2020 1317 Last data filed at 11/11/2020 1531 Gross per 24 hour  Intake 270.36 ml  Output 850 ml  Net -579.64 ml    Last 3 Weights 11/11/2020 11/10/2020 10/28/2020  Weight (lbs) 221 lb 1.9 oz 221 lb 6.4 oz 232 lb 12.9 oz  Weight (kg) 100.3 kg 100.426 kg 105.6 kg      Telemetry    Sinus tach w/ PVCs. Personally Reviewed  ECG    Personally Reviewed  Physical Exam   GEN: No acute distress.  Appears frail, soft-spoken Neck: No JVD Cardiac: RRR Respiratory: Clear to auscultation bilaterally. GI: Soft, nontender, non-distended  MS: No edema; No deformity. Neuro:  Nonfocal  Psych: Normal affect   Labs    High Sensitivity Troponin:   Recent Labs  Lab 11/08/20 0935 11/08/20 1120 11/08/20 1533 11/08/20 1813 11/10/20 1451   TROPONINIHS 449* 841* 2,805* 3,275* 861*       Chemistry Recent Labs  Lab 11/08/20 0935 11/09/20 0631 11/10/20 0528 11/11/20 0507 11/12/20 0420  NA 140   < > 142 141 139  K 5.4*   < > 4.3 4.8 4.2  CL 95*   < > 98 93* 89*  CO2 33*   < > 40* 39* 43*  GLUCOSE 150*   < > 132* 123* 110*  BUN 35*   < > 42* 39* 42*  CREATININE 0.89   < > 0.97 0.90 0.91  CALCIUM 9.4   < > 8.9 9.0 8.8*  PROT 7.6  --   --   --   --   ALBUMIN 3.8  --   --   --   --   AST 46*  --   --   --   --   ALT 31  --   --   --   --   ALKPHOS 67  --   --   --   --   BILITOT 0.6  --   --   --   --   GFRNONAA >60   < > >60 >60 >60  ANIONGAP 12   < > 4*  9 7   < > = values in this interval not displayed.      Hematology Recent Labs  Lab 11/10/20 0528 11/11/20 0507 11/12/20 0420  WBC 8.3 12.3* 8.0  RBC 3.37* 3.44* 3.00*  HGB 10.6* 10.4* 9.2*  HCT 33.6* 33.8* 30.0*  MCV 99.7 98.3 100.0  MCH 31.5 30.2 30.7  MCHC 31.5 30.8 30.7  RDW 13.6 13.1 13.2  PLT 185 180 143*     BNP Recent Labs  Lab 11/08/20 0935  BNP 1,087.0*      DDimer No results for input(s): DDIMER in the last 168 hours.   Radiology    DG Chest 1 View  Result Date: 11/10/2020 CLINICAL DATA:  Shortness of breath EXAM: CHEST  1 VIEW COMPARISON:  11/08/2020 chest x-ray and CT FINDINGS: Post sternotomy changes. Moderate bilateral pleural effusions increased compared to prior. Worsening airspace disease at the bases. Cardiomegaly with vascular congestion and probable pulmonary edema. Aortic atherosclerosis. Right lower rib fracture. IMPRESSION: Cardiomegaly with vascular congestion and probable pulmonary edema. Increasing moderate bilateral pleural effusions and worsening basilar airspace disease Electronically Signed   By: Donavan Foil M.D.   On: 11/10/2020 21:46    Cardiac Studies   TTEcho 10/07/2020 1. LVEF is severely depressed with severe hypokineis of the mid/distal  ventricle; distal anterior, dsital inferior and apical akinesis.  Note apex  is not well seen in this bedside study COmpared to 2021 no significant  change . Left ventricular ejection  fraction, by estimation, is 20%%. The left ventricular internal cavity  size was severely dilated. Left ventricular diastolic parameters are  indeterminate.   2. Right ventricular systolic function is normal. The right ventricular  size is normal. There is normal pulmonary artery systolic pressure.   3. Trivial mitral valve regurgitation.   4. The aortic valve is tricuspid. Aortic valve regurgitation is not  visualized. Mild aortic valve sclerosis is present, with no evidence of  aortic valve stenosis.   Patient Profile     86 y.o. male with history of CAD/CABG, redo CABG, ischemic cardiomyopathy last EF 20%, LV thrombus presenting with cough and shortness of breath, diagnosed with pneumonia.  Being seen for cardiomyopathy/CHF.  Assessment & Plan    Ischemic cardiomyopathy, EF 20% Warm and still likely volume overloaded. -Continue gentle diuresing, IV Lasix. Possibly transition to oral lasix tomorrow. - cont spirono, toprol-xl  2.  NSTEMI, CAD/CABG -Frailty, comorbidities, not candidate for left heart cath -Coumadin due to history of LV thrombus  3.  History of LV thrombus -PTA Coumadin, bridged with heparin per pharmacy  4.  Pneumonia -Antibiotics as per primary team     Signed, Vickie Epley, MD  11/12/2020, 1:17 PM

## 2020-11-12 NOTE — Consult Note (Signed)
[place] for IV heparin/warfarin Indication: chest pain/ACS; Atrial fibrillation  Patient Measurements: Heparin Dosing Weight: 91 kg  Labs: Recent Labs    11/09/20 0631 11/09/20 2011 11/10/20 0528 11/10/20 1451 11/11/20 0507 11/11/20 1457 11/11/20 2229  HGB 10.2*  --  10.6*  --  10.4*  --   --   HCT 32.9*  --  33.6*  --  33.8*  --   --   PLT 174  --  185  --  180  --   --   LABPROT 22.0*  --  18.9*  --  16.5*  --   --   INR 1.9*  --  1.6*  --  1.3*  --   --   HEPARINUNFRC 0.23*   < > 0.32  --  0.16* 0.49 0.33  CREATININE 0.89  --  0.97  --  0.90  --   --   TROPONINIHS  --   --   --  861*  --   --   --    < > = values in this interval not displayed.     Estimated Creatinine Clearance: 71.4 mL/min (by C-G formula based on SCr of 0.9 mg/dL).   Medical History: Past Medical History:  Diagnosis Date   Arthritis    Coronary artery disease    a. s/p CABG in 1981 b. cath in 10/2014 showing occluded grafts --> redo CABG in 2016 LIMA-LAD and Left Radial-OM2   Essential hypertension    Hematuria    History of blood transfusion 07/2015   Knee replacement HgB <9   History of pneumonia    History of stroke    Ischemic cardiomyopathy    Left ventricular apical thrombus    Mixed hyperlipidemia    Myocardial infarction [facility]) 1980   Peripheral vascular disease ([place])    Polio osteopathy of lower leg ([place]) Age 69   Affected right leg   Skin cancer    TIA (transient ischemic attack) 1989   Chronic coumadin   UTI (lower urinary tract infection)     Medications:  Warfarin 8 mg daily prior to admission (last PTA dose 8/8) Prior chronic regimen was 10 mg PO 6 days/week, and 5 mg daily on Fridays (TWD = 65 mg), during recent hospital admission 10/23/20 - 10/28/20, patient received 10 mg daily. Discharge orders to SNF where warfarin dose was decreased to 7.5 mg daily starting 10/29/2020 with discharge orders for INR check on 10/31/20. Dose was  recently increased to 8 mg daily at SNF (TWD = 56 mg)    Assessment: Patient is an 86 y/o M with medical history as above and including CAD s/p CABG, PVD, LV thrombus, TIA / CVA who was BIBEMS from [facility] for unresponsiveness and respiratory failure. Troponin elevated to 449. Pharmacy has been consulted to initiate heparin infusion for suspected ACS.  Baseline H&H, platelets acceptable. Baseline INR 1.4 (on warfarin as outpatient), aPTT 35.   Warfarin: Patient on chronic warfarin therapy for paroxymal atrial fibrillation, recurrent TIAs, and LV thrombus. Prior chronic regimen was 10 mg PO 6 days/week, and 5 mg daily on Fridays (TWD = 65 mg), during recent hospital admission 10/23/20 - 10/28/20, patient received 10 mg daily. Discharge orders to SNF where warfarin dose was decreased to 7.5 mg daily starting 10/29/2020 with discharge orders for INR check on 10/31/20. Dose was recently increased to 8 mg daily at SNF (TWD = 56 mg). INR on admission 1.4 (subtherapeutic). Pharmacy has been consulted  to restart warfarin therapy.   DD interactions: --Cefepime IV 3 doses -- last dose 8/10 @ [id number] -- discontinued   Date INR Warfarin Dose  8/8 -- 8 mg (PTA) 8/9 1.4 Held -- IV heparin infusion for ACS started  8/10 1.9   Goal of Therapy:  INR 2-3  Heparin level 0.3-0.7 units/ml Monitor platelets by anticoagulation protocol: Yes  0809 2136 HL 0.41, therapeutic; 1100 units/hr 0810 0631 HL 0.23, subtherapeutic; 1100 units/hr 0810 2011 HL 0.31, therapeutic; 1250 units/hr 0811 0528 HL 0.31, therapeutic; 1250 units/hr 0812 0507 HL 0.16, subtherapeutic 0812 1457 HL 0.49, therapeutic; 1500 units/hr 0812 2229 HL 0.33, therapeutic, trending down   Plan:  Warfarin: 8/9:  INR 1.4 8/10  INR 1.9 (dose not ordered) 8/11 INR 1.6 '12mg'$  8/12 INR 1.3  Decrease in INR likely d/t missed dose on 8/10. Will reorder Warfarin 12 mg again tonight(1.5 times his home dose) INR daily  Heparin: --Heparin level is  therapeutic --Continue heparin at current rate of 1500 units/hr  --Will recheck HL with AM labs --Daily CBC per protocol while on IV heparin; --H&H and platelets are down-trending today.  --Monitor closely for s/sx of bleeding  Renda Rolls, PharmD, [facility] 11/12/2020 12:37 AM

## 2020-11-12 NOTE — Progress Notes (Signed)
PROGRESS NOTE  Benjamin Cordova  DOB: 08/27/1934  PCP: Pablo Lawrence, NP [id number]  DOA: 11/08/2020  LOS: 4 days  Hospital Day: 5   Chief Complaint  Patient presents with   Respiratory Distress   Brief narrative: Benjamin Cordova is a 86 y.o. male with PMH significant for HTN, stroke, PAD, CAD/CABG, ischemic cardiomyopathy, paroxysmal A. fib, history of LV mural thrombus. Patient was brought to ED from [place] on 8/9 for evaluation of altered mental status and hypoxia. Nursing home staff found the patient unresponsive with a oxygen saturation of 85% on room air.  He was placed on 15 L of oxygen by nonrebreather mask with improvement in oxygen saturation and sent to ED.  In the ED, blood gas showed an uncompensated respiratory acidosis with pH of 7.3, PCO2 elevated at 79 and hence patient was placed on BiPAP. Labs showed potassium elevated to 5.4, bicarbonate 33, BNP elevated to more than 1000, troponin elevated to 449>> 841, lactic acid elevated to 2.1>> 2.9, WBC count elevated to 17.4 Respiratory virus panel negative CT scan of head did not show any acute abnormality.  It showed chronic cortical/subcortical infarct within the right frontal, parietal and temporal lobes. Chest x-ray showed bilateral small to moderate pleural effusion.  It also showed additional patchy ill-defined opacities in bilateral lower lung fields. Patient was admitted to hospitalist service for further evaluation management  Cardiology service was consulted. See below for details.  Subjective: Patient was seen and examined this morning.  Lying on bed. Not in distress. On low-flow oxygen. Son at bedside.  Assessment/Plan: Acute on chronic systolic heart failure -Echo from 10/07/2020 showed EF of 20%, severe hypokinesis and severe dilation of the LV.   -Currently getting diuresed with Lasix IV 40 mg twice daily per cardiology.  Also on metoprolol succinate 50 mg daily, and Aldactone. -Net IO Since  Admission: -3,692.14 mL [11/12/20 1509] -Continue to monitor for daily intake output, weight, blood pressure, BNP, renal function and electrolytes. Recent Labs  Lab 11/08/20 0935 11/09/20 0631 11/10/20 0528 11/11/20 0507 11/12/20 0420  BNP 1,087.0*  --   --   --   --   BUN 35* 41* 42* 39* 42*  CREATININE 0.89 0.89 0.97 0.90 0.91  K 5.4* 4.7 4.3 4.8 4.2  MG 2.2  --  2.5*  --   --    Acute on chronic respiratory failure with hypoxia and hypercapnia -Brought from nursing facility after he was noted to be unresponsive with O2 sat low at 55%. -Probably related to acute exacerbation of CHF. -Initially required BiPAP.  Currently on 2lpm oxygen by nasal cannula.  Wean down as tolerated.  Hyperkalemia -Potassium level has now improved to normal. Recent Labs  Lab 11/08/20 0935 11/09/20 0631 11/10/20 0528 11/11/20 0507 11/12/20 0420  K 5.4* 4.7 4.3 4.8 4.2  MG 2.2  --  2.5*  --   --   PHOS  --   --  3.8  --   --    SIRS -POA Lactic acidosis -Presented with hypoxia, elevated WBC count, elevated lactic acid level. -However, no clear evidence of pneumonia in the imaging.  Procalcitonin level negative x2. -Empiric antibiotics were stopped. Recent Labs  Lab 11/08/20 0935 11/08/20 1120 11/08/20 1533 11/08/20 1634 11/09/20 0631 11/10/20 0528 11/11/20 0507 11/12/20 0420  WBC 17.4*  --   --   --  9.2 8.3 12.3* 8.0  LATICACIDVEN 2.1* 2.9* 2.6* 2.3*  --   --  0.8  --  PROCALCITON <0.10  --   --   --  <0.10  --   --   --    Elevated troponin History of CAD/CABG, PAD -EKG without any ST-T wave changes. -Troponin elevated, trend as 2800>3200>861 -Initially placed on heparin drip -Cardiology consultation was obtained.  In the absence of chest pain, shortness of breath, ischemic evaluation was not pursued.  -Continue simvastatin   History of paroxysmal atrial fibrillation/LV thrombus -Patient was on Coumadin with subtherapeutic INR -Continue heparin drip until INR becomes  therapeutic -Check daily PT/INR Recent Labs  Lab 11/08/20 0935 11/09/20 0631 11/10/20 0528 11/11/20 0507 11/12/20 0420  INR 1.4* 1.9* 1.6* 1.3* 1.8*   BPH -Continue finasteride and Flomax.  -Has a Foley catheter in place.  Per patient and his son, he was placed while he was at any pain 4 weeks ago.  I could not find from the documentation why it was placed and was not removed.  Patient does not seem to have a urologist as an outpatient.  He is getting IV Lasix at this time.  Noted a plan from cardiology is to change to oral tomorrow.  We will remove catheter and do a voiding trial tomorrow.  Acute metabolic encephalopathy -Altered mental status resolved.   -Alert, awake, oriented x3 for last few days  Mobility: Encourage ambulation.  Pending PT eval. Code Status:   Code Status: DNR  Nutritional status: Body mass index is 33.63 kg/m.     Diet:  Diet Order             Diet Heart Room service appropriate? Yes; Fluid consistency: Thin  Diet effective now                  DVT prophylaxis: Heparin drip bridge with Coumadin  warfarin (COUMADIN) tablet 8 mg   Antimicrobials: Not on antibiotics Fluid: None Consultants: Cardiology Family Communication: None at bedside  Status is: Inpatient  Remains inpatient appropriate because: Continues IV diuresis  Dispo: The patient is from: [place] facility              Anticipated d/c is to: Back to nursing facility in next 2 to 3 days.              Patient currently is not medically stable to d/c.   Difficult to place patient No     Infusions:   heparin 1,700 Units/hr (11/12/20 0644)    Scheduled Meds:  finasteride  5 mg Oral Daily   furosemide  40 mg Intravenous BID   guaiFENesin  600 mg Oral BID   metoprolol succinate  50 mg Oral Daily   senna-docusate  1 tablet Oral QHS   simvastatin  40 mg Oral QHS   spironolactone  12.5 mg Oral Daily   tamsulosin  0.4 mg Oral q AM   warfarin  8 mg Oral ONCE-1600    Warfarin - Pharmacist Dosing Inpatient   Does not apply q1600    Antimicrobials: Anti-infectives (From admission, onward)    Start     Dose/Rate Route Frequency Ordered Stop   11/09/20 1600  vancomycin (VANCOREADY) IVPB 1500 mg/300 mL  Status:  Discontinued        1,500 mg 150 mL/hr over 120 Minutes Intravenous Every 24 hours 11/08/20 1446 11/09/20 1304   11/08/20 1600  ceFEPIme (MAXIPIME) 2 g in sodium chloride 0.9 % 100 mL IVPB  Status:  Discontinued        2 g 200 mL/hr over 30 Minutes  Intravenous Every 8 hours 11/08/20 1403 11/09/20 1304   11/08/20 1400  vancomycin (VANCOCIN) IVPB 1000 mg/200 mL premix        1,000 mg 200 mL/hr over 60 Minutes Intravenous  Once 11/08/20 1357 11/08/20 1619   11/08/20 1400  ceFEPIme (MAXIPIME) 2 g in sodium chloride 0.9 % 100 mL IVPB  Status:  Discontinued        2 g 200 mL/hr over 30 Minutes Intravenous  Once 11/08/20 1357 11/08/20 1403   11/08/20 1000  ceFEPIme (MAXIPIME) 2 g in sodium chloride 0.9 % 100 mL IVPB        2 g 200 mL/hr over 30 Minutes Intravenous  Once 11/08/20 0957 11/08/20 1117   11/08/20 1000  vancomycin (VANCOCIN) IVPB 1000 mg/200 mL premix        1,000 mg 200 mL/hr over 60 Minutes Intravenous  Once 11/08/20 0957 11/08/20 1242       PRN meds: acetaminophen **OR** acetaminophen, ipratropium-albuterol, ondansetron **OR** ondansetron (ZOFRAN) IV, polyethylene glycol   Objective: Vitals:   11/12/20 0731 11/12/20 1231  BP: (!) 95/57 115/71  Pulse: 86 91  Resp: 17   Temp: 98.2 F (36.8 C) 98 F (36.7 C)  SpO2: 94% 92%    Intake/Output Summary (Last 24 hours) at 11/12/2020 1509 Last data filed at 11/11/2020 1531 Gross per 24 hour  Intake 270.36 ml  Output 850 ml  Net -579.64 ml   Filed Weights   11/10/20 0937 11/11/20 0332  Weight: 100.4 kg 100.3 kg   Weight change:  Body mass index is 33.63 kg/m.   Physical Exam: General exam: Pleasant, elderly Caucasian male.  Not in physical distress Skin: No rashes,  lesions or ulcers. HEENT: Atraumatic, normocephalic, no obvious bleeding Lungs: Clear to auscultation bilaterally CVS: Regular rate and rhythm, no murmur GI/Abd soft, nontender, nondistended, bowel sound present CNS: Alert, awake, oriented x3 Psychiatry: Mood appropriate Extremities: Trace bilateral pedal edema, no calf tenderness  Data Review: I have personally reviewed the laboratory data and studies available.  Recent Labs  Lab 11/08/20 0935 11/09/20 0631 11/10/20 0528 11/11/20 0507 11/12/20 0420  WBC 17.4* 9.2 8.3 12.3* 8.0  NEUTROABS 15.3*  --   --   --   --   HGB 12.0* 10.2* 10.6* 10.4* 9.2*  HCT 38.1* 32.9* 33.6* 33.8* 30.0*  MCV 99.2 98.5 99.7 98.3 100.0  PLT 300 174 185 180 143*   Recent Labs  Lab 11/08/20 0935 11/09/20 0631 11/10/20 0528 11/11/20 0507 11/12/20 0420  NA 140 142 142 141 139  K 5.4* 4.7 4.3 4.8 4.2  CL 95* 97* 98 93* 89*  CO2 33* 35* 40* 39* 43*  GLUCOSE 150* 109* 132* 123* 110*  BUN 35* 41* 42* 39* 42*  CREATININE 0.89 0.89 0.97 0.90 0.91  CALCIUM 9.4 9.1 8.9 9.0 8.8*  MG 2.2  --  2.5*  --   --   PHOS  --   --  3.8  --   --     F/u labs ordered Unresulted Labs (From admission, onward)     Start     Ordered   11/12/20 1500  Heparin level (unfractionated)  Once-Timed,   TIMED       Question:  Specimen collection method  Answer:  Lab=Lab collect   11/12/20 0641   11/10/20 0500  Protime-INR  Daily,   R      11/09/20 1030            Signed, Terrilee Croak, MD [facility]  11/12/2020             

## 2020-11-13 ENCOUNTER — Inpatient Hospital Stay: Payer: Medicare HMO

## 2020-11-13 DIAGNOSIS — I5023 Acute on chronic systolic (congestive) heart failure: Secondary | ICD-10-CM | POA: Diagnosis not present

## 2020-11-13 DIAGNOSIS — I513 Intracardiac thrombosis, not elsewhere classified: Secondary | ICD-10-CM | POA: Diagnosis not present

## 2020-11-13 LAB — BASIC METABOLIC PANEL
Anion gap: 8 (ref 5–15)
BUN: 36 mg/dL — ABNORMAL HIGH (ref 8–23)
CO2: 43 mmol/L — ABNORMAL HIGH (ref 22–32)
Calcium: 8.8 mg/dL — ABNORMAL LOW (ref 8.9–10.3)
Chloride: 87 mmol/L — ABNORMAL LOW (ref 98–111)
Creatinine, Ser: 0.88 mg/dL (ref 0.61–1.24)
GFR, Estimated: >60 mL/min (ref >60)
Glucose, Bld: 139 mg/dL — ABNORMAL HIGH (ref 70–99)
Potassium: 3.7 mmol/L (ref 3.5–5.1)
Sodium: 138 mmol/L (ref 135–145)

## 2020-11-13 LAB — CBC
HCT: 29 % — ABNORMAL LOW (ref 39.0–52.0)
Hemoglobin: 9.4 g/dL — ABNORMAL LOW (ref 13.0–17.0)
MCH: 31.8 pg (ref 26.0–34.0)
MCHC: 32.4 g/dL (ref 30.0–36.0)
MCV: 98 fL (ref 80.0–100.0)
Platelets: 148 10*3/uL — ABNORMAL LOW (ref 150–400)
RBC: 2.96 MIL/uL — ABNORMAL LOW (ref 4.22–5.81)
RDW: 13.3 % (ref 11.5–15.5)
WBC: 8.1 10*3/uL (ref 4.0–10.5)
nRBC: 0 % (ref 0.0–0.2)

## 2020-11-13 LAB — CULTURE, BLOOD (SINGLE)
Culture: NO GROWTH
Special Requests: ADEQUATE

## 2020-11-13 LAB — PROTIME-INR
INR: 2 — ABNORMAL HIGH (ref 0.8–1.2)
Prothrombin Time: 22.8 seconds — ABNORMAL HIGH (ref 11.4–15.2)

## 2020-11-13 LAB — HEPARIN LEVEL (UNFRACTIONATED): Heparin Unfractionated: 0.51 IU/mL (ref 0.30–0.70)

## 2020-11-13 MED ORDER — TRAZODONE HCL 50 MG PO TABS
25.0000 mg | ORAL_TABLET | Freq: Every evening | ORAL | Status: DC | PRN
  Administered 2020-11-13: 25 mg via ORAL
  Filled 2020-11-13: qty 1

## 2020-11-13 MED ORDER — WARFARIN SODIUM 4 MG PO TABS
8.0000 mg | ORAL_TABLET | Freq: Once | ORAL | Status: AC
  Administered 2020-11-13: 8 mg via ORAL
  Filled 2020-11-13: qty 2

## 2020-11-13 MED ORDER — TORSEMIDE 20 MG PO TABS
20.0000 mg | ORAL_TABLET | Freq: Two times a day (BID) | ORAL | Status: DC
  Administered 2020-11-13 – 2020-11-14 (×2): 20 mg via ORAL
  Filled 2020-11-13 (×2): qty 1

## 2020-11-13 MED ORDER — CHLORHEXIDINE GLUCONATE CLOTH 2 % EX PADS
6.0000 | MEDICATED_PAD | Freq: Every day | CUTANEOUS | Status: DC
  Administered 2020-11-13: 6 via TOPICAL

## 2020-11-13 NOTE — Progress Notes (Signed)
Patient on nasal o2 in no distress. Didn't want to use bipap right now.

## 2020-11-13 NOTE — Progress Notes (Signed)
Pt has yet to void since foley removed around 1730 by first shift RN and now c/o abdominal pain and inability to urinate. Bladder scan showed >678 mL and I&O cath drained 725 mL. MD Mansy made aware and no new orders at this time.  Earleen Reaper, RN

## 2020-11-13 NOTE — Progress Notes (Signed)
PROGRESS NOTE  Benjamin Cordova  DOB: 1935/03/18  PCP: Pablo Lawrence, NP [id number]  DOA: 11/08/2020  LOS: 5 days  Hospital Day: 6   Chief Complaint  Patient presents with   Respiratory Distress   Brief narrative: Benjamin Cordova is a 86 y.o. male with PMH significant for HTN, stroke, PAD, CAD/CABG, ischemic cardiomyopathy, paroxysmal A. fib, history of LV mural thrombus. Patient was brought to ED from [place] on 8/9 for evaluation of altered mental status and hypoxia. Nursing home staff found the patient unresponsive with a oxygen saturation of 85% on room air.  He was placed on 15 L of oxygen by nonrebreather mask with improvement in oxygen saturation and sent to ED.  In the ED, blood gas showed an uncompensated respiratory acidosis with pH of 7.3, PCO2 elevated at 79 and hence patient was placed on BiPAP. Labs showed potassium elevated to 5.4, bicarbonate 33, BNP elevated to more than 1000, troponin elevated to 449>> 841, lactic acid elevated to 2.1>> 2.9, WBC count elevated to 17.4 Respiratory virus panel negative CT scan of head did not show any acute abnormality.  It showed chronic cortical/subcortical infarct within the right frontal, parietal and temporal lobes. Chest x-ray showed bilateral small to moderate pleural effusion.  It also showed additional patchy ill-defined opacities in bilateral lower lung fields. Patient was admitted to hospitalist service for further evaluation management  Cardiology service was consulted. See below for details.  Subjective: Patient was seen and examined this morning.  Lying down in bed.  On low-flow oxygen.  Not in distress.  Kathryne Sharper with clear urine.  Assessment/Plan: Acute on chronic systolic heart failure -Echo from 10/07/2020 showed EF of 20%, severe hypokinesis and severe dilation of the LV.   -Currently getting diuresed with Lasix IV 40 mg twice daily.  Noted a change from IV Lasix to oral torsemide twice daily by cardiology  today..  Also on metoprolol succinate 50 mg daily, and Aldactone. -Net IO Since Admission: -7,562.98 mL [11/13/20 1359] -Continue to monitor for daily intake output, weight, blood pressure, BNP, renal function and electrolytes. Recent Labs  Lab 11/08/20 0935 11/09/20 0631 11/10/20 0528 11/11/20 0507 11/12/20 0420 11/13/20 1229  BNP 1,087.0*  --   --   --   --   --   BUN 35* 41* 42* 39* 42* 36*  CREATININE 0.89 0.89 0.97 0.90 0.91 0.88  K 5.4* 4.7 4.3 4.8 4.2 3.7  MG 2.2  --  2.5*  --   --   --     Acute on chronic respiratory failure with hypoxia and hypercapnia -Brought from nursing facility after he was noted to be unresponsive with O2 sat low at 55%. -Probably related to acute exacerbation of CHF. -Initially required BiPAP.  Currently on 2lpm oxygen by nasal cannula.  Wean down as tolerated.  Hyperkalemia -Potassium level has now improved to normal. Recent Labs  Lab 11/08/20 0935 11/09/20 0631 11/10/20 0528 11/11/20 0507 11/12/20 0420 11/13/20 1229  K 5.4* 4.7 4.3 4.8 4.2 3.7  MG 2.2  --  2.5*  --   --   --   PHOS  --   --  3.8  --   --   --     SIRS -POA Lactic acidosis -Presented with hypoxia, elevated WBC count, elevated lactic acid level. -However, no clear evidence of pneumonia in the imaging.  Procalcitonin level negative x2. -Empiric antibiotics were stopped. Recent Labs  Lab 11/08/20 0935 11/08/20 1120 11/08/20 1533 11/08/20 1634 11/09/20  [id number] 11/10/20 0528 11/11/20 0507 11/12/20 0420 11/13/20 0537  WBC 17.4*  --   --   --  9.2 8.3 12.3* 8.0 8.1  LATICACIDVEN 2.1* 2.9* 2.6* 2.3*  --   --  0.8  --   --   PROCALCITON <0.10  --   --   --  <0.10  --   --   --   --     Elevated troponin History of CAD/CABG, PAD -EKG without any ST-T wave changes. -Troponin elevated, trend as 2800>3200>861 -Initially placed on heparin drip -Cardiology consultation was obtained.  In the absence of chest pain, shortness of breath, ischemic evaluation was not pursued.   -Continue simvastatin   History of paroxysmal atrial fibrillation/LV thrombus -Patient was on Coumadin presented with subtherapeutic INR.  Currently on bridged with heparin drip.  INR seems to be improved to 2 today.  Continue Coumadin. -Check daily PT/INR Recent Labs  Lab 11/09/20 0631 11/10/20 0528 11/11/20 0507 11/12/20 0420 11/13/20 0537  INR 1.9* 1.6* 1.3* 1.8* 2.0*    BPH -Continue finasteride and Flomax.  -Has a Foley catheter in place.  Per patient and his son, he was placed while he was at [facility] weeks ago.  I could not find from the documentation why it was placed and was not removed.  Patient does not seem to have a urologist as an outpatient.  Since he is no longer on IV Lasix.  We will DC Foley catheter today and do a voiding trial.  Acute metabolic encephalopathy -Altered mental status resolved.   -Alert, awake, oriented x3 for last few days  Mobility: Encourage ambulation.  Pending PT eval. Code Status:   Code Status: DNR  Nutritional status: Body mass index is 34.51 kg/m.     Diet:  Diet Order             Diet Heart Room service appropriate? Yes; Fluid consistency: Thin  Diet effective now                  DVT prophylaxis: Heparin drip bridge with Coumadin  warfarin (COUMADIN) tablet 8 mg   Antimicrobials: Not on antibiotics Fluid: None Consultants: Cardiology Family Communication: None at bedside  Status is: Inpatient  Remains inpatient appropriate because: Continue IV diuresis  Dispo: The patient is from: [place] facility              Anticipated d/c is to: Back to nursing facility in next 1 to 2 days              Patient currently is not medically stable to d/c.   Difficult to place patient No     Infusions:   heparin 1,700 Units/hr (11/13/20 0353)    Scheduled Meds:  Chlorhexidine Gluconate Cloth  6 each Topical Q0600   finasteride  5 mg Oral Daily   guaiFENesin  600 mg Oral BID   metoprolol succinate  50  mg Oral Daily   senna-docusate  1 tablet Oral QHS   simvastatin  40 mg Oral QHS   spironolactone  12.5 mg Oral Daily   tamsulosin  0.4 mg Oral q AM   torsemide  20 mg Oral BID   warfarin  8 mg Oral ONCE-1600   Warfarin - Pharmacist Dosing Inpatient   Does not apply q1600    Antimicrobials: Anti-infectives (From admission, onward)    Start     Dose/Rate Route Frequency Ordered Stop   11/09/20 1600  vancomycin (VANCOREADY) IVPB 1500  mg/300 mL  Status:  Discontinued        1,500 mg 150 mL/hr over 120 Minutes Intravenous Every 24 hours 11/08/20 1446 11/09/20 1304   11/08/20 1600  ceFEPIme (MAXIPIME) 2 g in sodium chloride 0.9 % 100 mL IVPB  Status:  Discontinued        2 g 200 mL/hr over 30 Minutes Intravenous Every 8 hours 11/08/20 1403 11/09/20 1304   11/08/20 1400  vancomycin (VANCOCIN) IVPB 1000 mg/200 mL premix        1,000 mg 200 mL/hr over 60 Minutes Intravenous  Once 11/08/20 1357 11/08/20 1619   11/08/20 1400  ceFEPIme (MAXIPIME) 2 g in sodium chloride 0.9 % 100 mL IVPB  Status:  Discontinued        2 g 200 mL/hr over 30 Minutes Intravenous  Once 11/08/20 1357 11/08/20 1403   11/08/20 1000  ceFEPIme (MAXIPIME) 2 g in sodium chloride 0.9 % 100 mL IVPB        2 g 200 mL/hr over 30 Minutes Intravenous  Once 11/08/20 0957 11/08/20 1117   11/08/20 1000  vancomycin (VANCOCIN) IVPB 1000 mg/200 mL premix        1,000 mg 200 mL/hr over 60 Minutes Intravenous  Once 11/08/20 0957 11/08/20 1242       PRN meds: acetaminophen **OR** acetaminophen, ipratropium-albuterol, ondansetron **OR** ondansetron (ZOFRAN) IV, polyethylene glycol   Objective: Vitals:   11/13/20 0759 11/13/20 1146  BP: 119/66 112/71  Pulse: 92 91  Resp: 18 18  Temp: 98 F (36.7 C) 98.4 F (36.9 C)  SpO2: 94% 96%    Intake/Output Summary (Last 24 hours) at 11/13/2020 1359 Last data filed at 11/13/2020 1330 Gross per 24 hour  Intake 1679.16 ml  Output 5550 ml  Net -3870.84 ml    Filed Weights    11/10/20 0937 11/11/20 0332 11/13/20 0108  Weight: 100.4 kg 100.3 kg 102.9 kg   Weight change:  Body mass index is 34.51 kg/m.   Physical Exam: General exam: Pleasant, elderly Caucasian male.  Not in physical distress Skin: No rashes, lesions or ulcers. HEENT: Atraumatic, normocephalic, no obvious bleeding Lungs: Clear to auscultation bilaterally CVS: Regular rate and rhythm, no murmur GI/Abd soft, nontender, nondistended, bowel sound present CNS: Alert, awake, slow to respond but oriented x3 Psychiatry: Depressed look Extremities: Trace bilateral pedal edema, no calf tenderness  Data Review: I have personally reviewed the laboratory data and studies available.  Recent Labs  Lab 11/08/20 0935 11/09/20 0631 11/10/20 0528 11/11/20 0507 11/12/20 0420 11/13/20 0537  WBC 17.4* 9.2 8.3 12.3* 8.0 8.1  NEUTROABS 15.3*  --   --   --   --   --   HGB 12.0* 10.2* 10.6* 10.4* 9.2* 9.4*  HCT 38.1* 32.9* 33.6* 33.8* 30.0* 29.0*  MCV 99.2 98.5 99.7 98.3 100.0 98.0  PLT 300 174 185 180 143* 148*    Recent Labs  Lab 11/08/20 0935 11/09/20 0631 11/10/20 0528 11/11/20 0507 11/12/20 0420 11/13/20 1229  NA 140 142 142 141 139 138  K 5.4* 4.7 4.3 4.8 4.2 3.7  CL 95* 97* 98 93* 89* 87*  CO2 33* 35* 40* 39* 43* 43*  GLUCOSE 150* 109* 132* 123* 110* 139*  BUN 35* 41* 42* 39* 42* 36*  CREATININE 0.89 0.89 0.97 0.90 0.91 0.88  CALCIUM 9.4 9.1 8.9 9.0 8.8* 8.8*  MG 2.2  --  2.5*  --   --   --   PHOS  --   --  3.8  --   --   --  F/u labs ordered Unresulted Labs (From admission, onward)     Start     Ordered   11/14/20 0500  CBC  Tomorrow morning,   R       Question:  Specimen collection method  Answer:  Lab=Lab collect   11/13/20 0839   11/14/20 0500  Heparin level (unfractionated)  Tomorrow morning,   R       Question:  Specimen collection method  Answer:  Lab=Lab collect   11/13/20 0839   11/13/20 0000000  Basic metabolic panel  Daily,   R     Question:  Specimen collection  method  Answer:  Lab=Lab collect   11/13/20 1202   11/10/20 0500  Protime-INR  Daily,   R      11/09/20 1030            Signed, Terrilee Croak, MD [facility] 11/13/2020

## 2020-11-13 NOTE — Progress Notes (Addendum)
Progress Note  Patient Name: Benjamin Cordova Date of Encounter: 11/13/2020  Primary Cardiologist: Rozann Lesches, MD   Subjective   No CP. Reports SOB with [facility] O2 still in place. States he is not feeling well this AM. No tachypalpitations.   Inpatient Medications    Scheduled Meds:  Chlorhexidine Gluconate Cloth  6 each Topical Q0600   finasteride  5 mg Oral Daily   furosemide  40 mg Intravenous BID   guaiFENesin  600 mg Oral BID   metoprolol succinate  50 mg Oral Daily   senna-docusate  1 tablet Oral QHS   simvastatin  40 mg Oral QHS   spironolactone  12.5 mg Oral Daily   tamsulosin  0.4 mg Oral q AM   warfarin  8 mg Oral ONCE-1600   Warfarin - Pharmacist Dosing Inpatient   Does not apply q1600   Continuous Infusions:  heparin 1,700 Units/hr (11/13/20 0353)   PRN Meds: acetaminophen **OR** acetaminophen, ipratropium-albuterol, ondansetron **OR** ondansetron (ZOFRAN) IV, polyethylene glycol   Vital Signs    Vitals:   11/13/20 0108 11/13/20 0428 11/13/20 0759 11/13/20 1146  BP:  107/74 119/66 112/71  Pulse:  82 92 91  Resp:  '20 18 18  '$ Temp:  98.3 F (36.8 C) 98 F (36.7 C) 98.4 F (36.9 C)  TempSrc:    Oral  SpO2:  98% 94% 96%  Weight: 102.9 kg     Height:        Intake/Output Summary (Last 24 hours) at 11/13/2020 1150 Last data filed at 11/13/2020 1146 Gross per 24 hour  Intake 1199.16 ml  Output 5550 ml  Net -4350.84 ml    Last 3 Weights 11/13/2020 11/11/2020 11/10/2020  Weight (lbs) 226 lb 14.4 oz 221 lb 1.9 oz 221 lb 6.4 oz  Weight (kg) 102.921 kg 100.3 kg 100.426 kg      Telemetry    SR-ST, PVCs, NSVT, AT/SVT - Personally Reviewed  ECG    No new tracings - Personally Reviewed  Physical Exam   GEN: No acute distress.  Some work of breathing noted. Somnolent. Cardiac: RRR, no murmurs, rubs, or gallops.  Respiratory: Coarse breath sounds bilaterally. GI: Soft, nontender, non-distended  MS: Mild bilateral nonpitting edema; No  deformity. Neuro:  Nonfocal  Psych: Normal affect   Labs    High Sensitivity Troponin:   Recent Labs  Lab 11/08/20 0935 11/08/20 1120 11/08/20 1533 11/08/20 1813 11/10/20 1451  TROPONINIHS 449* 841* 2,805* 3,275* 861*       Chemistry Recent Labs  Lab 11/08/20 0935 11/09/20 0631 11/10/20 0528 11/11/20 0507 11/12/20 0420  NA 140   < > 142 141 139  K 5.4*   < > 4.3 4.8 4.2  CL 95*   < > 98 93* 89*  CO2 33*   < > 40* 39* 43*  GLUCOSE 150*   < > 132* 123* 110*  BUN 35*   < > 42* 39* 42*  CREATININE 0.89   < > 0.97 0.90 0.91  CALCIUM 9.4   < > 8.9 9.0 8.8*  PROT 7.6  --   --   --   --   ALBUMIN 3.8  --   --   --   --   AST 46*  --   --   --   --   ALT 31  --   --   --   --   ALKPHOS 67  --   --   --   --  BILITOT 0.6  --   --   --   --   GFRNONAA >60   < > >60 >60 >60  ANIONGAP 12   < > 4* 9 7   < > = values in this interval not displayed.      Hematology Recent Labs  Lab 11/11/20 0507 11/12/20 0420 11/13/20 0537  WBC 12.3* 8.0 8.1  RBC 3.44* 3.00* 2.96*  HGB 10.4* 9.2* 9.4*  HCT 33.8* 30.0* 29.0*  MCV 98.3 100.0 98.0  MCH 30.2 30.7 31.8  MCHC 30.8 30.7 32.4  RDW 13.1 13.2 13.3  PLT 180 143* 148*     BNP Recent Labs  Lab 11/08/20 0935  BNP 1,087.0*      DDimer No results for input(s): DDIMER in the last 168 hours.   Radiology    No results found.  Cardiac Studies   Echo 10/07/2020 1. LVEF is severely depressed with severe hypokineis of the mid/distal  ventricle; distal anterior, dsital inferior and apical akinesis. Note apex  is not well seen in this bedside study COmpared to 2021 no significant  change . Left ventricular ejection  fraction, by estimation, is 20%%. The left ventricular internal cavity  size was severely dilated. Left ventricular diastolic parameters are  indeterminate.   2. Right ventricular systolic function is normal. The right ventricular  size is normal. There is normal pulmonary artery systolic pressure.   3.  Trivial mitral valve regurgitation.   4. The aortic valve is tricuspid. Aortic valve regurgitation is not  visualized. Mild aortic valve sclerosis is present, with no evidence of  aortic valve stenosis.     MPI 2017   IMPRESSION: 1. Large fixed defect at the apex extending into the anterior and inferior walls. Concern for peri-infarct ischemia in the anterior wall and lower septum.   2. Severe diffuse hypokinesia with akinesia in the areas of scar.   3. Left ventricular ejection fraction 26%   4. Non invasive risk stratification*: High risk   *2012 Appropriate Use Criteria for Coronary Revascularization Focused Update: J Am Coll Cardiol. [id number]. http://content.airportbarriers.com.aspx?articleid=1201161     Electronically Signed   By: Rolm Baptise M.D.   On: 09/03/2015 13:09     Cardiac cath 2016 Prox LAD lesion, 80% stenosed. Mid LAD lesion, 100% stenosed. 1st Diag-1 lesion, 90% stenosed. 1st Diag-2 lesion, 100% stenosed. 1st Mrg lesion, 100% stenosed. 2nd Mrg lesion, 90% stenosed. Prox RCA to Mid RCA lesion, 90% stenosed. Mid RCA to Dist RCA lesion, 100% stenosed. Acute Mrg lesion, 90% stenosed. SVG . Origin lesion, before 1st Diag, 100% stenosed. Origin lesion, 100% stenosed. Origin lesion, 100% stenosed. There is moderate to severe left ventricular systolic dysfunction.   1. Severe 3 vessel obstructive/occlusive CAD. 2. All grafts are occluded including SVG to diagonal/LAD, SVG to OM1, and SVG to RCA. 3. Moderate to severe LV dysfunction. 4. The LIMA is widely patent as was not used previously for bypass.   Recommendation: consider for redo CABG  Patient Profile     86 y.o. male with history of CAD/CABG, redo CABG 2016, chronic HFrEF due to ischemic cardiomyopathy complicated by LV thrombus on PTA Coumadin with EF 20% by echo 09/2020, HTN, HLD, and presenting with cough and shortness of breath with pna / sepsis diagnosed and cardiology consulted  for elevated HS Tn and AOC HFrEF at presentation.  Assessment & Plan    Acute on chronic HFrEF --Reports SOB today with some work of breathing noted. AOC HFrEF and pna. BNP over  1000.00 at presentation and CT with b/l pleural effusions. Echo 09/2020 with EF less than 20%. Currently on  oxygen at 2-3L. --Daily BMET.  Will order. Most recent Cr stable, electrolytes at goal. --Continue IV lasix for now given ongoing overload. Net -7L for the admission. Net -3.5L yesterday. 221.12lbs  226.9lbs. Accuracy of wt? --Escalation of GDMT as BP/Cr allows. Continue lasix, Toprol,  spironolactone.  Consider Entresto 24-'26mg'$  BID before discharge if stable Cr/K and ongoing room in BP.SGLT2i can be added now or in the OP setting, if tolerated.   NSTEMI History of CAD s/p CABG 1981 / redo CABG 2016 --No CP. HS Tn trended to 3275.0 and down-trended. EKG with non-specific findings. History of CAD and CABG as outlined in consult note. IV heparin with history of thrombus. No plan for further ischemic workup at this time. Not an ideal candidate for catheterization or invasive procedures at this time given frailty and comorbids and respiratory status and high risk for decompensation. He also has refused catheterization several times in the recent past. Continue medical management with BB and statin. RF modification. Not on ASA given need for anticoagulation.  History of LV thrombus on PTA Coumadin --Continue IV heparin. Following IV heparin, restart and titration of Coumadin per pharmacy. Last INR 2.0.  Essential hypertension --Continue current medications.   HLD --Continue statin  Pna/sepsis --Abx per IM.   For questions or updates, please contact [place] Please consult [email or web address] for contact info under        Signed, Arvil Chaco, PA-C  11/13/2020, 11:50 AM

## 2020-11-13 NOTE — Consult Note (Signed)
[place] for IV heparin/warfarin Indication: chest pain/ACS; Atrial fibrillation  Patient Measurements: Heparin Dosing Weight: 91 kg  Labs: Recent Labs     0000 11/10/20 1451 11/11/20 0507 11/11/20 1457 11/12/20 0420 11/12/20 1526 11/12/20 2307 11/13/20 0537  HGB   < >  --  10.4*  --  9.2*  --   --  9.4*  HCT  --   --  33.8*  --  30.0*  --   --  29.0*  PLT  --   --  180  --  143*  --   --  148*  LABPROT  --   --  16.5*  --  20.8*  --   --  22.8*  INR  --   --  1.3*  --  1.8*  --   --  2.0*  HEPARINUNFRC  --   --  0.16*   < > 0.28* 0.50 0.55 0.51  CREATININE  --   --  0.90  --  0.91  --   --   --   TROPONINIHS  --  861*  --   --   --   --   --   --    < > = values in this interval not displayed.     Estimated Creatinine Clearance: 71.5 mL/min (by C-G formula based on SCr of 0.91 mg/dL).   Medical History: Past Medical History:  Diagnosis Date   Arthritis    Coronary artery disease    a. s/p CABG in 1981 b. cath in 10/2014 showing occluded grafts --> redo CABG in 2016 LIMA-LAD and Left Radial-OM2   Essential hypertension    Hematuria    History of blood transfusion 07/2015   Knee replacement HgB <9   History of pneumonia    History of stroke    Ischemic cardiomyopathy    Left ventricular apical thrombus    Mixed hyperlipidemia    Myocardial infarction [facility]) 1980   Peripheral vascular disease ([place])    Polio osteopathy of lower leg ([place]) Age 86   Affected right leg   Skin cancer    TIA (transient ischemic attack) 1989   Chronic coumadin   UTI (lower urinary tract infection)     Medications:  Warfarin 8 mg daily prior to admission (last PTA dose 8/8) Prior chronic regimen was 10 mg PO 6 days/week, and 5 mg daily on Fridays (TWD = 65 mg), during recent hospital admission 10/23/20 - 10/28/20, patient received 10 mg daily. Discharge orders to SNF where warfarin dose was decreased to 7.5 mg daily starting 10/29/2020 with discharge  orders for INR check on 10/31/20. Dose was recently increased to 8 mg daily at SNF (TWD = 56 mg)    Assessment: Patient is an 86 y/o M with medical history as above and including CAD s/p CABG, PVD, LV thrombus, TIA / CVA who was BIBEMS from [facility] for unresponsiveness and respiratory failure. Troponin elevated to 449. Pharmacy has been consulted to initiate heparin infusion for suspected ACS.  Date INR Warfarin Dose  8/8 -- 8 mg (PTA) 8/9 1.4 Held -- IV heparin infusion for ACS started  8/10  1.9  (dose not ordered) 8/11 1.6  12 mg 8/12 1.3 12 mg  8/13 1.8  8 mg 8/'14 2 8 '$ mg   0809 2136 HL 0.41, therapeutic; 1100 units/hr 0810 0631 HL 0.23, subtherapeutic; 1100 units/hr 0810 2011 HL 0.31, therapeutic; 1250 units/hr 0811 0528 HL 0.31, therapeutic; 1250 units/hr [id number]  0507 HL 0.16, subtherapeutic 0812 1457 HL 0.49, therapeutic; 1500 units/hr 0812 2229 HL 0.33, therapeutic, trending down 0813 0420 HL 0.28, subtherapeutic 0813 1526 HL 0.50, therapeutic x 1 0814 0537 HL 0.51, therapeutic x 2  Goal of Therapy:  INR 2-3  Heparin level 0.3-0.7 units/ml Monitor platelets by anticoagulation protocol: Yes  Warfarin: INR is therapeutic but at lower end of goal. Will give warfarin 8 mg x 1 (~25% increase from home dose). Pt received 12 mg x 2 and INR jumped 0.5. Daily INR ordered.   Heparin: Heparin level is therapeutic. Continue heparin infusion 1700 units/hr. Recheck heparin level with AM labs. CBC daily while on heparin.   Oswald Hillock, PharmD, BCPS 11/13/2020 7:54 AM

## 2020-11-13 NOTE — Progress Notes (Signed)
Physical Therapy Treatment Patient Details Name: Benjamin Cordova MRN: [id number] DOB: [birth year] Today's Date: 11/13/2020    History of Present Illness Benjamin Cordova is a 86 y.o. male with PMH significant for HTN, stroke, PAD, CAD/CABG, ischemic cardiomyopathy, paroxysmal A. fib, history of LV mural thrombus.  Patient was brought to ED from [place] on 8/9 for evaluation of altered mental status and hypoxia.  Nursing home staff found the patient unresponsive with a oxygen saturation of 85% on room air.  He was placed on 15 L of oxygen by nonrebreather mask with improvement in oxygen saturation and sent to ED.     In the ED, blood gas showed an uncompensated respiratory acidosis with pH of 7.3, PCO2 elevated at 79 and hence patient was placed on BiPAP. Pt with elevated troponin on heparin drip, secure chat sent to MD and cleared for participation.    PT Comments    Pt received asleep but easily awakened & agreeable to tx. Pt requires mod<>max assist for supine<>sit EOB with HOB elevated & use of bed rails. Pt is able to perform seated BLE LAQ with cuing for technique but pt unable to achieve full knee extension ROM. Pt engaged in lateral scoots along EOB with max assist & PT providing tactile cuing & manual facilitation for head/hips relationship; task practiced in anticipation & preparation of lateral scoots to recliner. Pt notes fatigue after scooting & requests to lie down. Will continue to follow acutely to progress mobility as able.     Follow Up Recommendations  SNF;Supervision/Assistance - 24 hour     Equipment Recommendations  None recommended by PT    Recommendations for Other Services       Precautions / Restrictions Precautions Precautions: Fall Restrictions Weight Bearing Restrictions: No    Mobility  Bed Mobility Overal bed mobility: Needs Assistance Bed Mobility: Supine to Sit;Sit to Supine     Supine to sit: Max assist;HOB elevated Sit to supine: Mod  assist;HOB elevated   General bed mobility comments: use of bed rails, assistance to upright trunk, extra time to complete movement, pt is able to scoot to [facility] with bed in trendelenburg position using bed rails    Transfers                    Ambulation/Gait                 Stairs             Wheelchair Mobility    Modified Rankin (Stroke Patients Only)       Balance Overall balance assessment: Needs assistance Sitting-balance support: Bilateral upper extremity supported;Feet supported Sitting balance-Leahy Scale: Fair Sitting balance - Comments: static sitting with supervision                                    Cognition Arousal/Alertness: Awake/alert Behavior During Therapy: Flat affect Overall Cognitive Status: Within Functional Limits for tasks assessed                                 General Comments: [facility]      Exercises General Exercises - Lower Extremity Long Arc Quad: Seated;AROM;Strengthening;Both;10 reps    General Comments General comments (skin integrity, edema, etc.): Pt on 1L/min via nasal cannula, lowest SPO2 89% but checked on another finger & 96%  Pertinent Vitals/Pain Pain Assessment: No/denies pain    Home Living                      Prior Function            PT Goals (current goals can now be found in the care plan section) Acute Rehab PT Goals Patient Stated Goal: to feel better PT Goal Formulation: With patient Time For Goal Achievement: 11/24/20 Potential to Achieve Goals: Fair Progress towards PT goals: Progressing toward goals    Frequency    Min 2X/week      PT Plan Current plan remains appropriate    Co-evaluation              AM-PAC PT "6 Clicks" Mobility   Outcome Measure  Help needed turning from your back to your side while in a flat bed without using bedrails?: A Lot Help needed moving from lying on your back to sitting on the side of a flat  bed without using bedrails?: Total Help needed moving to and from a bed to a chair (including a wheelchair)?: Total Help needed standing up from a chair using your arms (e.g., wheelchair or bedside chair)?: Total Help needed to walk in hospital room?: Total Help needed climbing 3-5 steps with a railing? : Total 6 Click Score: 7    End of Session Equipment Utilized During Treatment: Oxygen Activity Tolerance: Patient limited by fatigue Patient left: in bed;with call bell/phone within reach;with bed alarm set   PT Visit Diagnosis: Unsteadiness on feet (R26.81);History of falling (Z91.81);Difficulty in walking, not elsewhere classified (R26.2);Muscle weakness (generalized) (M62.81)     Time: [id number] PT Time Calculation (min) (ACUTE ONLY): 15 min  Charges:  $Therapeutic Activity: 8-22 mins                     Benjamin Cordova, PT, DPT 11/13/20, 11:01 AM    [name] 11/13/2020, 11:00 AM

## 2020-11-14 DIAGNOSIS — I5043 Acute on chronic combined systolic (congestive) and diastolic (congestive) heart failure: Secondary | ICD-10-CM | POA: Diagnosis not present

## 2020-11-14 DIAGNOSIS — R051 Acute cough: Secondary | ICD-10-CM | POA: Diagnosis not present

## 2020-11-14 DIAGNOSIS — J9621 Acute and chronic respiratory failure with hypoxia: Secondary | ICD-10-CM | POA: Diagnosis not present

## 2020-11-14 DIAGNOSIS — E785 Hyperlipidemia, unspecified: Secondary | ICD-10-CM | POA: Diagnosis not present

## 2020-11-14 DIAGNOSIS — K6289 Other specified diseases of anus and rectum: Secondary | ICD-10-CM | POA: Diagnosis not present

## 2020-11-14 DIAGNOSIS — R1312 Dysphagia, oropharyngeal phase: Secondary | ICD-10-CM | POA: Diagnosis not present

## 2020-11-14 DIAGNOSIS — Z743 Need for continuous supervision: Secondary | ICD-10-CM | POA: Diagnosis not present

## 2020-11-14 DIAGNOSIS — R404 Transient alteration of awareness: Secondary | ICD-10-CM | POA: Diagnosis not present

## 2020-11-14 DIAGNOSIS — G9341 Metabolic encephalopathy: Secondary | ICD-10-CM | POA: Diagnosis not present

## 2020-11-14 DIAGNOSIS — I480 Paroxysmal atrial fibrillation: Secondary | ICD-10-CM | POA: Diagnosis not present

## 2020-11-14 DIAGNOSIS — Z87891 Personal history of nicotine dependence: Secondary | ICD-10-CM | POA: Diagnosis not present

## 2020-11-14 DIAGNOSIS — J341 Cyst and mucocele of nose and nasal sinus: Secondary | ICD-10-CM | POA: Diagnosis not present

## 2020-11-14 DIAGNOSIS — D689 Coagulation defect, unspecified: Secondary | ICD-10-CM | POA: Diagnosis not present

## 2020-11-14 DIAGNOSIS — I5023 Acute on chronic systolic (congestive) heart failure: Secondary | ICD-10-CM | POA: Diagnosis not present

## 2020-11-14 DIAGNOSIS — A419 Sepsis, unspecified organism: Secondary | ICD-10-CM | POA: Diagnosis not present

## 2020-11-14 DIAGNOSIS — E782 Mixed hyperlipidemia: Secondary | ICD-10-CM | POA: Diagnosis present

## 2020-11-14 DIAGNOSIS — I2510 Atherosclerotic heart disease of native coronary artery without angina pectoris: Secondary | ICD-10-CM | POA: Diagnosis present

## 2020-11-14 DIAGNOSIS — R531 Weakness: Secondary | ICD-10-CM | POA: Diagnosis not present

## 2020-11-14 DIAGNOSIS — I639 Cerebral infarction, unspecified: Secondary | ICD-10-CM | POA: Diagnosis not present

## 2020-11-14 DIAGNOSIS — Z8249 Family history of ischemic heart disease and other diseases of the circulatory system: Secondary | ICD-10-CM | POA: Diagnosis not present

## 2020-11-14 DIAGNOSIS — J9811 Atelectasis: Secondary | ICD-10-CM | POA: Diagnosis not present

## 2020-11-14 DIAGNOSIS — R0689 Other abnormalities of breathing: Secondary | ICD-10-CM | POA: Diagnosis not present

## 2020-11-14 DIAGNOSIS — I5082 Biventricular heart failure: Secondary | ICD-10-CM | POA: Diagnosis present

## 2020-11-14 DIAGNOSIS — I255 Ischemic cardiomyopathy: Secondary | ICD-10-CM | POA: Diagnosis present

## 2020-11-14 DIAGNOSIS — I739 Peripheral vascular disease, unspecified: Secondary | ICD-10-CM | POA: Diagnosis not present

## 2020-11-14 DIAGNOSIS — Z8673 Personal history of transient ischemic attack (TIA), and cerebral infarction without residual deficits: Secondary | ICD-10-CM | POA: Diagnosis not present

## 2020-11-14 DIAGNOSIS — S2231XA Fracture of one rib, right side, initial encounter for closed fracture: Secondary | ICD-10-CM | POA: Diagnosis not present

## 2020-11-14 DIAGNOSIS — I214 Non-ST elevation (NSTEMI) myocardial infarction: Secondary | ICD-10-CM | POA: Diagnosis not present

## 2020-11-14 DIAGNOSIS — Z7901 Long term (current) use of anticoagulants: Secondary | ICD-10-CM | POA: Diagnosis not present

## 2020-11-14 DIAGNOSIS — U071 COVID-19: Secondary | ICD-10-CM | POA: Diagnosis not present

## 2020-11-14 DIAGNOSIS — N1831 Chronic kidney disease, stage 3a: Secondary | ICD-10-CM | POA: Diagnosis not present

## 2020-11-14 DIAGNOSIS — J90 Pleural effusion, not elsewhere classified: Secondary | ICD-10-CM | POA: Diagnosis not present

## 2020-11-14 DIAGNOSIS — I517 Cardiomegaly: Secondary | ICD-10-CM | POA: Diagnosis not present

## 2020-11-14 DIAGNOSIS — N3289 Other specified disorders of bladder: Secondary | ICD-10-CM | POA: Diagnosis not present

## 2020-11-14 DIAGNOSIS — R069 Unspecified abnormalities of breathing: Secondary | ICD-10-CM | POA: Diagnosis not present

## 2020-11-14 DIAGNOSIS — J9622 Acute and chronic respiratory failure with hypercapnia: Secondary | ICD-10-CM | POA: Diagnosis not present

## 2020-11-14 DIAGNOSIS — L24A2 Irritant contact dermatitis due to fecal, urinary or dual incontinence: Secondary | ICD-10-CM | POA: Diagnosis not present

## 2020-11-14 DIAGNOSIS — R140 Abdominal distension (gaseous): Secondary | ICD-10-CM | POA: Diagnosis not present

## 2020-11-14 DIAGNOSIS — N281 Cyst of kidney, acquired: Secondary | ICD-10-CM | POA: Diagnosis not present

## 2020-11-14 DIAGNOSIS — J9600 Acute respiratory failure, unspecified whether with hypoxia or hypercapnia: Secondary | ICD-10-CM | POA: Diagnosis not present

## 2020-11-14 DIAGNOSIS — R0603 Acute respiratory distress: Secondary | ICD-10-CM | POA: Diagnosis not present

## 2020-11-14 DIAGNOSIS — Z20822 Contact with and (suspected) exposure to covid-19: Secondary | ICD-10-CM | POA: Diagnosis not present

## 2020-11-14 DIAGNOSIS — J84114 Acute interstitial pneumonitis: Secondary | ICD-10-CM | POA: Diagnosis not present

## 2020-11-14 DIAGNOSIS — I5020 Unspecified systolic (congestive) heart failure: Secondary | ICD-10-CM | POA: Diagnosis not present

## 2020-11-14 DIAGNOSIS — R0600 Dyspnea, unspecified: Secondary | ICD-10-CM | POA: Diagnosis not present

## 2020-11-14 DIAGNOSIS — Z85828 Personal history of other malignant neoplasm of skin: Secondary | ICD-10-CM | POA: Diagnosis not present

## 2020-11-14 DIAGNOSIS — I513 Intracardiac thrombosis, not elsewhere classified: Secondary | ICD-10-CM | POA: Diagnosis not present

## 2020-11-14 DIAGNOSIS — I472 Ventricular tachycardia: Secondary | ICD-10-CM | POA: Diagnosis not present

## 2020-11-14 DIAGNOSIS — I493 Ventricular premature depolarization: Secondary | ICD-10-CM | POA: Diagnosis present

## 2020-11-14 DIAGNOSIS — Z96652 Presence of left artificial knee joint: Secondary | ICD-10-CM | POA: Diagnosis not present

## 2020-11-14 DIAGNOSIS — J80 Acute respiratory distress syndrome: Secondary | ICD-10-CM | POA: Diagnosis not present

## 2020-11-14 DIAGNOSIS — Z66 Do not resuscitate: Secondary | ICD-10-CM | POA: Diagnosis not present

## 2020-11-14 DIAGNOSIS — Z1159 Encounter for screening for other viral diseases: Secondary | ICD-10-CM | POA: Diagnosis not present

## 2020-11-14 DIAGNOSIS — R000 Tachycardia, unspecified: Secondary | ICD-10-CM | POA: Diagnosis not present

## 2020-11-14 DIAGNOSIS — R0602 Shortness of breath: Secondary | ICD-10-CM | POA: Diagnosis not present

## 2020-11-14 DIAGNOSIS — I50813 Acute on chronic right heart failure: Secondary | ICD-10-CM | POA: Diagnosis not present

## 2020-11-14 DIAGNOSIS — I252 Old myocardial infarction: Secondary | ICD-10-CM | POA: Diagnosis not present

## 2020-11-14 DIAGNOSIS — D649 Anemia, unspecified: Secondary | ICD-10-CM | POA: Diagnosis not present

## 2020-11-14 DIAGNOSIS — J9601 Acute respiratory failure with hypoxia: Secondary | ICD-10-CM | POA: Diagnosis not present

## 2020-11-14 DIAGNOSIS — Z515 Encounter for palliative care: Secondary | ICD-10-CM | POA: Diagnosis not present

## 2020-11-14 DIAGNOSIS — I462 Cardiac arrest due to underlying cardiac condition: Secondary | ICD-10-CM | POA: Diagnosis not present

## 2020-11-14 DIAGNOSIS — I10 Essential (primary) hypertension: Secondary | ICD-10-CM | POA: Diagnosis not present

## 2020-11-14 DIAGNOSIS — I672 Cerebral atherosclerosis: Secondary | ICD-10-CM | POA: Diagnosis not present

## 2020-11-14 DIAGNOSIS — R0902 Hypoxemia: Secondary | ICD-10-CM | POA: Diagnosis not present

## 2020-11-14 DIAGNOSIS — I5022 Chronic systolic (congestive) heart failure: Secondary | ICD-10-CM | POA: Diagnosis not present

## 2020-11-14 DIAGNOSIS — R6520 Severe sepsis without septic shock: Secondary | ICD-10-CM | POA: Diagnosis not present

## 2020-11-14 DIAGNOSIS — M6281 Muscle weakness (generalized): Secondary | ICD-10-CM | POA: Diagnosis not present

## 2020-11-14 DIAGNOSIS — N134 Hydroureter: Secondary | ICD-10-CM | POA: Diagnosis not present

## 2020-11-14 DIAGNOSIS — I5032 Chronic diastolic (congestive) heart failure: Secondary | ICD-10-CM | POA: Diagnosis not present

## 2020-11-14 DIAGNOSIS — Z79899 Other long term (current) drug therapy: Secondary | ICD-10-CM | POA: Diagnosis not present

## 2020-11-14 DIAGNOSIS — J9602 Acute respiratory failure with hypercapnia: Secondary | ICD-10-CM | POA: Diagnosis not present

## 2020-11-14 DIAGNOSIS — N136 Pyonephrosis: Secondary | ICD-10-CM | POA: Diagnosis present

## 2020-11-14 DIAGNOSIS — K5900 Constipation, unspecified: Secondary | ICD-10-CM | POA: Diagnosis not present

## 2020-11-14 DIAGNOSIS — Z951 Presence of aortocoronary bypass graft: Secondary | ICD-10-CM | POA: Diagnosis not present

## 2020-11-14 DIAGNOSIS — I110 Hypertensive heart disease with heart failure: Secondary | ICD-10-CM | POA: Diagnosis not present

## 2020-11-14 DIAGNOSIS — N1330 Unspecified hydronephrosis: Secondary | ICD-10-CM | POA: Diagnosis not present

## 2020-11-14 LAB — RESP PANEL BY RT-PCR (FLU A&B, COVID) ARPGX2
Influenza A by PCR: NEGATIVE
Influenza B by PCR: NEGATIVE
SARS Coronavirus 2 by RT PCR: NEGATIVE

## 2020-11-14 LAB — CBC
HCT: 32 % — ABNORMAL LOW (ref 39.0–52.0)
Hemoglobin: 10.2 g/dL — ABNORMAL LOW (ref 13.0–17.0)
MCH: 30.4 pg (ref 26.0–34.0)
MCHC: 31.9 g/dL (ref 30.0–36.0)
MCV: 95.5 fL (ref 80.0–100.0)
Platelets: 163 10*3/uL (ref 150–400)
RBC: 3.35 MIL/uL — ABNORMAL LOW (ref 4.22–5.81)
RDW: 13.2 % (ref 11.5–15.5)
WBC: 8.8 10*3/uL (ref 4.0–10.5)
nRBC: 0 % (ref 0.0–0.2)

## 2020-11-14 LAB — BASIC METABOLIC PANEL
Anion gap: 9 (ref 5–15)
BUN: 32 mg/dL — ABNORMAL HIGH (ref 8–23)
CO2: 44 mmol/L — ABNORMAL HIGH (ref 22–32)
Calcium: 9.2 mg/dL (ref 8.9–10.3)
Chloride: 87 mmol/L — ABNORMAL LOW (ref 98–111)
Creatinine, Ser: 0.85 mg/dL (ref 0.61–1.24)
GFR, Estimated: >60 mL/min (ref >60)
Glucose, Bld: 127 mg/dL — ABNORMAL HIGH (ref 70–99)
Potassium: 3.7 mmol/L (ref 3.5–5.1)
Sodium: 140 mmol/L (ref 135–145)

## 2020-11-14 LAB — PROTIME-INR
INR: 2.3 — ABNORMAL HIGH (ref 0.8–1.2)
Prothrombin Time: 25.4 seconds — ABNORMAL HIGH (ref 11.4–15.2)

## 2020-11-14 LAB — HEPARIN LEVEL (UNFRACTIONATED): Heparin Unfractionated: 0.5 IU/mL (ref 0.30–0.70)

## 2020-11-14 MED ORDER — ATORVASTATIN CALCIUM 80 MG PO TABS: 80.0000 mg | ORAL_TABLET | Freq: Every day | ORAL | Status: AC

## 2020-11-14 MED ORDER — GUAIFENESIN ER 600 MG PO TB12: 600.0000 mg | ORAL_TABLET | Freq: Two times a day (BID) | ORAL | Status: AC

## 2020-11-14 MED ORDER — WARFARIN SODIUM 4 MG PO TABS
8.0000 mg | ORAL_TABLET | Freq: Once | ORAL | Status: DC
  Filled 2020-11-14: qty 2

## 2020-11-14 MED ORDER — WARFARIN SODIUM 5 MG PO TABS: 8.0000 mg | ORAL_TABLET | Freq: Every day | ORAL | Status: DC

## 2020-11-14 MED ORDER — TORSEMIDE 20 MG PO TABS: 40.0000 mg | ORAL_TABLET | Freq: Two times a day (BID) | ORAL | Status: DC

## 2020-11-14 MED ORDER — ATORVASTATIN CALCIUM 80 MG PO TABS: 80.0000 mg | ORAL_TABLET | Freq: Every day | ORAL | Status: DC

## 2020-11-14 MED ORDER — POLYETHYLENE GLYCOL 3350 17 G PO PACK: 17.0000 g | PACK | Freq: Every day | ORAL | 0 refills | Status: AC | PRN

## 2020-11-14 MED ORDER — TORSEMIDE 40 MG PO TABS: 40.0000 mg | ORAL_TABLET | Freq: Two times a day (BID) | ORAL | Status: AC

## 2020-11-14 MED ORDER — SPIRONOLACTONE 25 MG PO TABS: 12.5000 mg | ORAL_TABLET | Freq: Every day | ORAL | Status: AC

## 2020-11-14 MED ORDER — WARFARIN SODIUM 4 MG PO TABS: 8.0000 mg | ORAL_TABLET | Freq: Once | ORAL | Status: AC

## 2020-11-14 NOTE — Progress Notes (Signed)
Patient was found on room air this morning to be hypoxic with O2 saturations 68%. RN applied O2 via nasal cannula at 4 liters, patient able to recover in minutes to 100%. Patient remains on 2L O2 at this time.

## 2020-11-14 NOTE — Progress Notes (Signed)
Per night RN this patient was found to be retaining urine after voiding trial yesterday evening. Patient was in/out cathed x2 on night shift. This RN notified day hospitalist to inform, verbal order for foley placement given. This RN tried to place foley with unsuccessful attempt. MD notified as there was notable bleeding and discomfort with insertion attempt.

## 2020-11-14 NOTE — TOC Progression Note (Signed)
Transition of Care [facility]) - Progression Note    Patient Details  Name: Benjamin Cordova MRN: [id number] Date of Birth: [birth year]  [facility] of [facility]) CM/SW Contact  Eileen Stanford, LCSW Phone Number: 11/14/2020, 2:24 PM  Clinical Narrative:   Josem Kaufmann obtained    Expected Discharge Plan: [place] Barriers to Discharge: Continued Medical Work up  Expected Discharge Plan and Services Expected Discharge Plan: [place]       Living arrangements for the past 2 months: [place]                                       Social Determinants of Health (SDOH) Interventions    Readmission Risk Interventions Readmission Risk Prevention Plan 11/10/2020 10/11/2020 07/04/2019  Transportation Screening Complete Complete Complete  PCP or Specialist Appt within 3-5 Days - Complete Not Complete  HRI or Home Care Consult Complete Complete Complete  Social Work Consult for [place] Planning/Counseling Complete Complete Complete  Palliative Care Screening - Not Applicable Not Applicable  Medication Review Press photographer) Complete Complete Complete  Some recent data might be hidden

## 2020-11-14 NOTE — Consult Note (Signed)
[place] for IV heparin/warfarin Indication: chest pain/ACS; Atrial fibrillation  Patient Measurements: Heparin Dosing Weight: 91 kg  Labs: Recent Labs    11/12/20 0420 11/12/20 1526 11/12/20 2307 11/13/20 0537 11/13/20 1229 11/14/20 0442  HGB 9.2*  --   --  9.4*  --  10.2*  HCT 30.0*  --   --  29.0*  --  32.0*  PLT 143*  --   --  148*  --  163  LABPROT 20.8*  --   --  22.8*  --  25.4*  INR 1.8*  --   --  2.0*  --  2.3*  HEPARINUNFRC 0.28*   < > 0.55 0.51  --  0.50  CREATININE 0.91  --   --   --  0.88 0.85   < > = values in this interval not displayed.     Estimated Creatinine Clearance: 76.6 mL/min (by C-G formula based on SCr of 0.85 mg/dL).   Medical History: Past Medical History:  Diagnosis Date   Arthritis    Coronary artery disease    a. s/p CABG in 1981 b. cath in 10/2014 showing occluded grafts --> redo CABG in 2016 LIMA-LAD and Left Radial-OM2   Essential hypertension    Hematuria    History of blood transfusion 07/2015   Knee replacement HgB <9   History of pneumonia    History of stroke    Ischemic cardiomyopathy    Left ventricular apical thrombus    Mixed hyperlipidemia    Myocardial infarction [facility]) 1980   Peripheral vascular disease ([place])    Polio osteopathy of lower leg ([place]) Age 66   Affected right leg   Skin cancer    TIA (transient ischemic attack) 1989   Chronic coumadin   UTI (lower urinary tract infection)     Medications:  Warfarin 8 mg daily prior to admission (last PTA dose 8/8) Prior chronic regimen was 10 mg PO 6 days/week, and 5 mg daily on Fridays (TWD = 65 mg), during recent hospital admission 10/23/20 - 10/28/20, patient received 10 mg daily. Discharge orders to SNF where warfarin dose was decreased to 7.5 mg daily starting 10/29/2020 with discharge orders for INR check on 10/31/20. Dose was recently increased to 8 mg daily at SNF (TWD = 56 mg)    Assessment: Patient is an 86 y/o M with  medical history as above and including CAD s/p CABG, PVD, LV thrombus, TIA / CVA who was BIBEMS from [facility] for unresponsiveness and respiratory failure. Troponin elevated to 449. Pharmacy has been consulted to initiate heparin infusion for suspected ACS.  Date INR Warfarin Dose  8/8 -- 8 mg (PTA) 8/9 1.4 Held -- IV heparin infusion for ACS started  8/10  1.9  (dose not ordered) 8/11 1.6  12 mg 8/12 1.3 12 mg  8/13 1.8  8 mg 8/'14 2 8 '$ mg   0809 2136 HL 0.41, therapeutic; 1100 units/hr 0810 0631 HL 0.23, subtherapeutic; 1100 units/hr 0810 2011 HL 0.31, therapeutic; 1250 units/hr 0811 0528 HL 0.31, therapeutic; 1250 units/hr 0812 0507 HL 0.16, subtherapeutic 0812 1457 HL 0.49, therapeutic; 1500 units/hr 0812 2229 HL 0.33, therapeutic, trending down 0813 0420 HL 0.28, subtherapeutic 0813 1526 HL 0.50, therapeutic x 1 0814 0537 HL 0.51, therapeutic x 2 0815 0442 HL 0.50, therapeutic x 3  Goal of Therapy:  INR 2-3  Heparin level 0.3-0.7 units/ml Monitor platelets by anticoagulation protocol: Yes  Warfarin: INR is therapeutic but at lower end  of goal. Will give warfarin 8 mg x 1 (~25% increase from home dose). Pt received 12 mg x 2 and INR jumped 0.5. Daily INR ordered.   Heparin: Heparin level is therapeutic. Continue heparin infusion 1700 units/hr. Recheck heparin level with AM labs. CBC daily while on heparin.   Renda Rolls, PharmD, [facility] 11/14/2020 5:43 AM

## 2020-11-14 NOTE — Consult Note (Signed)
[place] for warfarin Indication: chest pain/ACS; Atrial fibrillation  Patient Measurements: Heparin Dosing Weight: 91 kg  Labs: Recent Labs    11/12/20 0420 11/12/20 1526 11/12/20 2307 11/13/20 0537 11/13/20 1229 11/14/20 0442  HGB 9.2*  --   --  9.4*  --  10.2*  HCT 30.0*  --   --  29.0*  --  32.0*  PLT 143*  --   --  148*  --  163  LABPROT 20.8*  --   --  22.8*  --  25.4*  INR 1.8*  --   --  2.0*  --  2.3*  HEPARINUNFRC 0.28*   < > 0.55 0.51  --  0.50  CREATININE 0.91  --   --   --  0.88 0.85   < > = values in this interval not displayed.     Estimated Creatinine Clearance: 76.6 mL/min (by C-G formula based on SCr of 0.85 mg/dL).   Medical History: Past Medical History:  Diagnosis Date   Arthritis    Coronary artery disease    a. s/p CABG in 1981 b. cath in 10/2014 showing occluded grafts --> redo CABG in 2016 LIMA-LAD and Left Radial-OM2   Essential hypertension    Hematuria    History of blood transfusion 07/2015   Knee replacement HgB <9   History of pneumonia    History of stroke    Ischemic cardiomyopathy    Left ventricular apical thrombus    Mixed hyperlipidemia    Myocardial infarction [facility]) 1980   Peripheral vascular disease ([place])    Polio osteopathy of lower leg ([place]) Age 86   Affected right leg   Skin cancer    TIA (transient ischemic attack) 1989   Chronic coumadin   UTI (lower urinary tract infection)     Medications:  Warfarin 8 mg daily prior to admission (last PTA dose 8/8) Prior chronic regimen was 10 mg PO 6 days/week, and 5 mg daily on Fridays (TWD = 65 mg), during recent hospital admission 10/23/20 - 10/28/20, patient received 10 mg daily. Discharge orders to SNF where warfarin dose was decreased to 7.5 mg daily starting 10/29/2020 with discharge orders for INR check on 10/31/20. Dose was recently increased to 8 mg daily at SNF (TWD = 56 mg)    Assessment: Patient is an 86 y/o M with medical history  as above and including CAD s/p CABG, PVD, LV thrombus, TIA / CVA who was [facility] from [facility] for unresponsiveness and respiratory failure. Troponin elevated to 449. Pharmacy has been consulted to initiate heparin infusion for suspected ACS.  Date INR Warfarin Dose  8/8 -- 8 mg (PTA) 8/9 1.4 Held -- IV heparin infusion for ACS started  8/10  1.9  (dose not ordered) 8/11 1.6  12 mg 8/12 1.3 12 mg  8/13 1.8 8 mg 8/'14 2 8 '$ mg  8/15 2.3 8 mg  0809 2136 HL 0.41, therapeutic; 1100 units/hr 0810 0631 HL 0.23, subtherapeutic; 1100 units/hr 0810 2011 HL 0.31, therapeutic; 1250 units/hr 0811 0528 HL 0.31, therapeutic; 1250 units/hr 0812 0507 HL 0.16, subtherapeutic 0812 1457 HL 0.49, therapeutic; 1500 units/hr 0812 2229 HL 0.33, therapeutic, trending down 0813 0420 HL 0.28, subtherapeutic 0813 1526 HL 0.50, therapeutic x 1 0814 0537 HL 0.51, therapeutic x 2 0815 0442 HL 0.50, therapeutic x 3  Goal of Therapy:  INR 2-3  Heparin level 0.3-0.7 units/ml Monitor platelets by anticoagulation protocol: Yes  Warfarin: INR is therapeutic but at  lower end of goal. Will give warfarin 8 mg x 1. Pt received 12 mg x 2 and INR jumped 0.5.  Monitor daily INR, CBC, s/s of bleed INR has been therapeutic x 2, will stop heparin infusion at this time  Darnelle Bos, PharmD 11/14/2020 8:29 AM

## 2020-11-14 NOTE — Discharge Summary (Addendum)
Physician Discharge Summary  Benjamin Cordova [id number] DOB: [birth year] DOA: 11/08/2020  PCP: Pablo Lawrence, NP  Admit date: 11/08/2020 Discharge date: 11/14/2020  Admitted From: Nursing facility Discharge disposition: Back to nursing facility   Code Status: DNR   Discharge Diagnosis:   Active Problems:   Coronary artery disease   Paroxysmal atrial fibrillation ([place])   Essential hypertension   S/P CABG x 2   LV (left ventricular) mural thrombus   Acute on chronic respiratory failure with hypoxia and hypercapnia (HCC)   Acute on chronic systolic HF (heart failure) (HCC)   Hyperkalemia   Acute metabolic encephalopathy   NSVT (nonsustained ventricular tachycardia) ([place])   NSTEMI (non-ST elevated myocardial infarction) [facility])    Chief Complaint  Patient presents with   Respiratory Distress   Brief narrative: Benjamin Cordova is a 86 y.o. male with PMH significant for HTN, stroke, PAD, CAD/CABG, ischemic cardiomyopathy, paroxysmal A. fib, history of LV mural thrombus. Patient was brought to ED from [facility] on 8/9 for evaluation of altered mental status and hypoxia. Nursing home staff found the patient unresponsive with a oxygen saturation of 85% on room air.  He was placed on 15 L of oxygen by nonrebreather mask with improvement in oxygen saturation and sent to ED.  In the ED, blood gas showed an uncompensated respiratory acidosis with pH of 7.3, PCO2 elevated at 79 and hence patient was placed on BiPAP. Labs showed potassium elevated to 5.4, bicarbonate 33, BNP elevated to more than 1000, troponin elevated to 449>> 841, lactic acid elevated to 2.1>> 2.9, WBC count elevated to 17.4 Respiratory virus panel negative CT scan of head did not show any acute abnormality.  It showed chronic cortical/subcortical infarct within the right frontal, parietal and temporal lobes. Chest x-ray showed bilateral small to moderate pleural effusion.  It also showed additional patchy  ill-defined opacities in bilateral lower lung fields. Patient was admitted to hospitalist service for further evaluation management  Cardiology service was consulted. See below for details.  Subjective: Patient was seen and examined this morning.  Lying down in bed.  On low-flow oxygen.  Not in distress.  Failed voiding trial.  Foley catheter reinserted.  Hospital course Acute on chronic systolic heart failure -Echo from 10/07/2020 showed EF of 20%, severe hypokinesis and severe dilation of the LV.   -Aggressively diuresed in the hospital with Lasix IV 40 mg twice daily.  Switched to torsemide 40 mg twice daily.  Also continue metoprolol succinate 50 mg daily and Aldactone. -Net IO Since Admission: -8,374.45 mL [11/14/20 1506] Recent Labs  Lab 11/08/20 0935 11/09/20 0631 11/10/20 0528 11/11/20 0507 11/12/20 0420 11/13/20 1229 11/14/20 0442  BNP 1,087.0*  --   --   --   --   --   --   BUN 35*   < > 42* 39* 42* 36* 32*  CREATININE 0.89   < > 0.97 0.90 0.91 0.88 0.85  K 5.4*   < > 4.3 4.8 4.2 3.7 3.7  MG 2.2  --  2.5*  --   --   --   --    < > = values in this interval not displayed.   Acute on chronic respiratory failure with hypoxia and hypercapnia -Brought from nursing facility after he was noted to be unresponsive with O2 sat low at 55%. -Most likely related to acute exacerbation of CHF. -Initially required BiPAP.  Currently on 2lpm oxygen by nasal cannula.  Wean down as tolerated.  SIRS -POA  Lactic acidosis -Presented with hypoxia, elevated WBC count, elevated lactic acid level. -However, no clear evidence of pneumonia in the imaging.  Procalcitonin level negative x2. -Empiric antibiotics were stopped.  Elevated troponin History of CAD/CABG, PAD -EKG without any ST-T wave changes. -Troponin elevated, trend as 2800>3200>861 -Initially placed on heparin drip -Cardiology consultation was obtained.  In the absence of chest pain, shortness of breath, ischemic evaluation was  not pursued.  -Continue statin   History of paroxysmal atrial fibrillation/LV thrombus -Patient was on Coumadin, presented with subtherapeutic INR.  Coumadin was resumed and heparin drip was started for bridge.  INR therapeutic now.  Discharged on the same dose of Coumadin.   -Continue to check INR as an outpatient. Recent Labs  Lab 11/10/20 0528 11/11/20 0507 11/12/20 0420 11/13/20 0537 11/14/20 0442  INR 1.6* 1.3* 1.8* 2.0* 2.3*   BPH -Continue finasteride and Flomax.  -Patient presented with a Foley catheter in place.  Per patient and his son, he was placed while he was at [facility] 4 weeks ago.  I could not find from the documentation why it was placed and was not removed.  Patient does not seem to have a urologist as an outpatient.  -8/14, voiding trial was tried but patient failed.  -8/15, Foley catheter was not reinserted again.  -Patient can follow-up with urologist as an outpatient.  Ambulatory referral given.  Acute metabolic encephalopathy -Altered mental status on admission, resolved.   -Alert, awake, oriented x3 for last few days   Allergies as of 11/14/2020   No Known Allergies      Medication List     STOP taking these medications    furosemide 40 MG tablet Commonly known as: LASIX   simvastatin 40 MG tablet Commonly known as: ZOCOR       TAKE these medications    acetaminophen 325 MG tablet Commonly known as: TYLENOL Take 2 tablets (650 mg total) by mouth every 6 (six) hours.   albuterol (2.5 MG/3ML) 0.083% nebulizer solution Commonly known as: PROVENTIL Take 2.5 mg by nebulization every 6 (six) hours as needed for wheezing or shortness of breath.   ascorbic acid 500 MG tablet Commonly known as: VITAMIN C Take 1,000 mg by mouth 2 (two) times daily.   atorvastatin 80 MG tablet Commonly known as: LIPITOR Take 1 tablet (80 mg total) by mouth at bedtime.   CertaVite/Antioxidants Tabs Take 1 tablet by mouth daily.   diphenhydrAMINE 50 MG  capsule Commonly known as: BENADRYL Take 1 capsule (50 mg total) by mouth at bedtime as needed for sleep.   finasteride 5 MG tablet Commonly known as: PROSCAR Take 5 mg by mouth daily.   folic acid [id number] MCG tablet Commonly known as: FOLVITE Take 800 mcg by mouth daily.   guaiFENesin 600 MG 12 hr tablet Commonly known as: MUCINEX Take 1 tablet (600 mg total) by mouth 2 (two) times daily.   ipratropium-albuterol 0.5-2.5 (3) MG/3ML Soln Commonly known as: DUONEB Take 3 mLs by nebulization every 4 (four) hours as needed (SOB/wheezing).   metoprolol succinate 50 MG 24 hr tablet Commonly known as: TOPROL-XL Take 1 tablet (50 mg total) by mouth daily. Take with or immediately following a meal.   polyethylene glycol 17 g packet Commonly known as: MIRALAX / GLYCOLAX Take 17 g by mouth daily as needed for moderate constipation.   potassium chloride 10 MEQ tablet Commonly known as: KLOR-CON Take 10 mEq by mouth daily. Take with Lasix (Furosemide)   senna-docusate 8.6-50 MG  tablet Commonly known as: Senokot-S Take 1 tablet by mouth at bedtime.   spironolactone 25 MG tablet Commonly known as: ALDACTONE Take 0.5 tablets (12.5 mg total) by mouth daily.   tamsulosin 0.4 MG Caps capsule Commonly known as: FLOMAX Take 0.4 mg by mouth in the morning.   Torsemide 40 MG Tabs Take 40 mg by mouth 2 (two) times daily.   warfarin 4 MG tablet Commonly known as: COUMADIN Take 2 tablets (8 mg total) by mouth one time only at 4 PM. What changed:  medication strength how much to take when to take this        Discharge Instructions:  Diet Recommendation:  Discharge Diet Orders (From admission, onward)     Start     Ordered   11/14/20 0000  Diet - low sodium heart healthy        11/14/20 1441              Follow with Primary MD Pablo Lawrence, NP in 7 days   Get CBC/BMP checked in next visit within 1 week by PCP or SNF MD ( we routinely change or add medications that can  affect your baseline labs and fluid status, therefore we recommend that you get the mentioned basic workup next visit with your PCP, your PCP may decide not to get them or add new tests based on their clinical decision)  On your next visit with your PCP, please Get Medicines reviewed and adjusted.  Please request your PCP  to go over all Hospital Tests and Procedure/Radiological results at the follow up, please get all Hospital records sent to your Prim MD by signing hospital release before you go home.  Activity: As tolerated with Full fall precautions use walker/cane & assistance as needed  For Heart failure patients - Check your Weight same time everyday, if you gain over 2 pounds, or you develop in leg swelling, experience more shortness of breath or chest pain, call your Primary MD immediately. Follow Cardiac Low Salt Diet and 1.5 lit/day fluid restriction.  If you have smoked or chewed Tobacco in the last 2 yrs please stop smoking, stop any regular Alcohol  and or any Recreational drug use.  If you experience worsening of your admission symptoms, develop shortness of breath, life threatening emergency, suicidal or homicidal thoughts you must seek medical attention immediately by calling 911 or calling your MD immediately  if symptoms less severe.  You Must read complete instructions/literature along with all the possible adverse reactions/side effects for all the Medicines you take and that have been prescribed to you. Take any new Medicines after you have completely understood and accpet all the possible adverse reactions/side effects.   Do not drive, operate heavy machinery, perform activities at heights, swimming or participation in water activities or provide baby sitting services if your were admitted for syncope or siezures until you have seen by Primary MD or a Neurologist and advised to do so again.  Do not drive when taking Pain medications.  Do not take more than prescribed Pain,  Sleep and Anxiety Medications  Wear Seat belts while driving.   Please note You were cared for by a hospitalist during your hospital stay. If you have any questions about your discharge medications or the care you received while you were in the hospital after you are discharged, you can call the unit and asked to speak with the hospitalist on call if the hospitalist that took care of you is not available. Once  you are discharged, your primary care physician will handle any further medical issues. Please note that NO REFILLS for any discharge medications will be authorized once you are discharged, as it is imperative that you return to your primary care physician (or establish a relationship with a primary care physician if you do not have one) for your aftercare needs so that they can reassess your need for medications and monitor your lab values.    Follow ups:    Follow-up Information     Pablo Lawrence, NP Follow up.   Specialty: Adult Health Nurse Practitioner Contact information: [address] [place] [address] [phone]         Satira Sark, MD .   Specialty: Cardiology Contact information: [place] [address] [phone]                 Wound care:     Discharge Exam:   Vitals:   11/13/20 2012 11/14/20 0815 11/14/20 0822 11/14/20 1233  BP: 102/62 130/73  110/80  Pulse: 94 91 94 (!) 104  Resp: (!) '24 20  18  '$ Temp: 98.7 F (37.1 C) 98 F (36.7 C)  98.1 F (36.7 C)  TempSrc:    Oral  SpO2: 97% (!) 72% 100% 98%  Weight:      Height:        Body mass index is 34.51 kg/m.  General exam: Pleasant, elderly Caucasian male.  Not in distress Skin: No rashes, lesions or ulcers. HEENT: Atraumatic, normocephalic, no obvious bleeding Lungs: Clear to auscultation bilaterally CVS: Regular rate and rhythm, no murmur GI/Abd soft, nontender, nondistended, bowel sound present CNS: Alert, awake, slow to respond but  oriented to place person and month and year Psychiatry: Mood appropriate Extremities: No pedal edema, no calf tenderness  Time coordinating discharge: 35 minutes   The results of significant diagnostics from this hospitalization (including imaging, microbiology, ancillary and laboratory) are listed below for reference.    Procedures and Diagnostic Studies:   CT HEAD WO CONTRAST (5MM)  Result Date: 11/08/2020 CLINICAL DATA:  Mental status change, unknown cause. EXAM: CT HEAD WITHOUT CONTRAST TECHNIQUE: Contiguous axial images were obtained from the base of the skull through the vertex without intravenous contrast. COMPARISON:  Prior head CT examinations 02/03/2019 and earlier. FINDINGS: Brain: Redemonstrated chronic cortical/subcortical infarct within the right frontal, parietal and temporal lobes. Associated ex vacuo dilatation of the right lateral ventricle. Background cerebral volume is normal for age. There is no acute intracranial hemorrhage. No acute demarcated cortical infarct. No extra-axial fluid collection. No evidence of an intracranial mass. No midline shift. Vascular: No hyperdense vessel.  Atherosclerotic calcifications. Skull: Normal. Negative for fracture or focal lesion. Sinuses/Orbits: Visualized orbits show no acute finding. Left maxillary sinus mucous retention cysts measuring up to 13 mm. Trace mucosal thickening within the right maxillary sinus. Trace bilateral ethmoid sinus mucosal thickening. Redemonstrated small foci of polypoid soft tissue within the right nasal passage. IMPRESSION: No evidence of acute intracranial abnormality. Redemonstrated chronic cortical/subcortical infarct within the right frontal, parietal and temporal lobes. Paranasal sinus disease, as described. Electronically Signed   By: Kellie Simmering DO   On: 11/08/2020 10:28   CT Angio Chest PE W and/or Wo Contrast  Result Date: 11/08/2020 CLINICAL DATA:  PE suspected, high prob.  Hypoxia. EXAM: CT ANGIOGRAPHY  CHEST WITH CONTRAST TECHNIQUE: Multidetector CT imaging of the chest was performed using the standard protocol during bolus administration of intravenous contrast.  Multiplanar CT image reconstructions and MIPs were obtained to evaluate the vascular anatomy. CONTRAST:  64m OMNIPAQUE IOHEXOL 350 MG/ML SOLN COMPARISON:  09/15/2019 FINDINGS: Cardiovascular: Pulmonary arterial opacification is adequate without evidence of emboli allowing for motion artifact which intermittently limits assessment of segmental and subsegmental vessels. There is moderate cardiomegaly including prominent left ventricular dilatation. Calcification is noted at the left ventricular apex consistent with previous myocardial infarction. There is no pericardial effusion. Three-vessel coronary atherosclerosis is noted. There is thoracic aortic atherosclerosis without aneurysm. Prior CABG. Mediastinum/Nodes: No enlarged axillary, mediastinal, or hilar lymph nodes. Unremarkable thyroid and esophagus. Lungs/Pleura: Moderate respiratory motion artifact. Moderate right and small left pleural effusions which are partially loculated and extend into the fissures. Associated compressive atelectasis greatest in the right greater than left lower lobes. Chronic scarring or atelectasis in the left upper lobe. Upper Abdomen: No acute finding. Musculoskeletal: Mildly displaced right lateral sixth, seventh, and eighth rib fractures with early callus formation. Review of the MIP images confirms the above findings. IMPRESSION: 1. No evidence of pulmonary emboli. 2. Moderate right and small left pleural effusions and atelectasis. 3. Subacute right rib fractures. 4. Aortic Atherosclerosis (ICD10-I70.0). Electronically Signed   By: ALogan BoresM.D.   On: 11/08/2020 14:01   UK[place]Venous Img Lower Unilateral Left (DVT)  Result Date: 11/08/2020 CLINICAL DATA:  86year old male with history of DVT, shortness of breath. EXAM: LEFT LOWER EXTREMITY VENOUS DOPPLER ULTRASOUND  TECHNIQUE: Gray-scale sonography with graded compression, as well as color Doppler and duplex ultrasound were performed to evaluate the left lower extremity deep venous systems from the level of the common femoral vein and including the common femoral, femoral, profunda femoral, popliteal and calf veins including the posterior tibial, peroneal and gastrocnemius veins when visible. Spectral Doppler was utilized to evaluate flow at rest and with distal augmentation maneuvers in the common femoral, femoral and popliteal veins. The contralateral common femoral vein was also evaluated for comparison. COMPARISON:  None. FINDINGS: LEFT LOWER EXTREMITY Common Femoral Vein: No evidence of thrombus. Normal compressibility, respiratory phasicity and response to augmentation. Central Greater Saphenous Vein: No evidence of thrombus. Normal compressibility and flow on color Doppler imaging. Central Profunda Femoral Vein: No evidence of thrombus. Normal compressibility and flow on color Doppler imaging. Femoral Vein: No evidence of thrombus. Normal compressibility, respiratory phasicity and response to augmentation. Popliteal Vein: No evidence of thrombus. Normal compressibility, respiratory phasicity and response to augmentation. Calf Veins: No evidence of thrombus. Normal compressibility and flow on color Doppler imaging. Other Findings:  None. RIGHT LOWER EXTREMITY Common Femoral Vein: No evidence of thrombus. Normal compressibility, respiratory phasicity and response to augmentation. IMPRESSION: No evidence of left lower extremity deep venous thrombosis. DRuthann Cancer MD [facility] Center[facility]iology Electronically Signed   By: DRuthann CancerMD   On: 11/08/2020 15:49   DG Chest Port 1 View  Result Date: 11/08/2020 CLINICAL DATA:  Questionable sepsis - evaluate for abnormality. EXAM: PORTABLE CHEST 1 VIEW COMPARISON:  Prior chest radiographs 10/25/2020 and earlier. CT angiogram chest  09/15/2019 FINDINGS: Prior median sternotomy. Unchanged cardiomegaly. Small to moderate bilateral pleural effusions. Redemonstrated scarring within the left mid lung. Additional patchy and ill-defined opacities within the bilateral mid to lower lung fields may reflect atelectasis, edema or pneumonia. No evidence of pneumothorax. No acute bony abnormality identified. IMPRESSION: Small-to-moderate bilateral pleural effusions. Additional patchy and ill-defined opacities within the bilateral mid-to-lower lung fields, which may reflect atelectasis, edema or pneumonia. Redemonstrated scarring within the left mid lung.  Unchanged cardiomegaly. Electronically Signed   By: Kellie Simmering DO   On: 11/08/2020 10:32     Labs:   Basic Metabolic Panel: Recent Labs  Lab 11/08/20 0935 11/09/20 0631 11/10/20 [id number] 11/11/20 0507 11/12/20 0420 11/13/20 1229 11/14/20 0442  NA 140   < > 142 141 139 138 140  K 5.4*   < > 4.3 4.8 4.2 3.7 3.7  CL 95*   < > 98 93* 89* 87* 87*  CO2 33*   < > 40* 39* 43* 43* 44*  GLUCOSE 150*   < > 132* 123* 110* 139* 127*  BUN 35*   < > 42* 39* 42* 36* 32*  CREATININE 0.89   < > 0.97 0.90 0.91 0.88 0.85  CALCIUM 9.4   < > 8.9 9.0 8.8* 8.8* 9.2  MG 2.2  --  2.5*  --   --   --   --   PHOS  --   --  3.8  --   --   --   --    < > = values in this interval not displayed.   GFR Estimated Creatinine Clearance: 76.6 mL/min (by C-G formula based on SCr of 0.85 mg/dL). Liver Function Tests: Recent Labs  Lab 11/08/20 0935  AST 46*  ALT 31  ALKPHOS 67  BILITOT 0.6  PROT 7.6  ALBUMIN 3.8   No results for input(s): LIPASE, AMYLASE in the last 168 hours. No results for input(s): AMMONIA in the last 168 hours. Coagulation profile Recent Labs  Lab 11/10/20 0528 11/11/20 0507 11/12/20 0420 11/13/20 0537 11/14/20 0442  INR 1.6* 1.3* 1.8* 2.0* 2.3*    CBC: Recent Labs  Lab 11/08/20 0935 11/09/20 0631 11/10/20 0528 11/11/20 0507 11/12/20 0420 11/13/20 0537 11/14/20 0442   WBC 17.4*   < > 8.3 12.3* 8.0 8.1 8.8  NEUTROABS 15.3*  --   --   --   --   --   --   HGB 12.0*   < > 10.6* 10.4* 9.2* 9.4* 10.2*  HCT 38.1*   < > 33.6* 33.8* 30.0* 29.0* 32.0*  MCV 99.2   < > 99.7 98.3 100.0 98.0 95.5  PLT 300   < > 185 180 143* 148* 163   < > = values in this interval not displayed.   Cardiac Enzymes: No results for input(s): CKTOTAL, CKMB, CKMBINDEX, TROPONINI in the last 168 hours. BNP: Invalid input(s): POCBNP CBG: No results for input(s): GLUCAP in the last 168 hours. D-Dimer No results for input(s): DDIMER in the last 72 hours. Hgb A1c No results for input(s): HGBA1C in the last 72 hours. Lipid Profile No results for input(s): CHOL, HDL, LDLCALC, TRIG, CHOLHDL, LDLDIRECT in the last 72 hours. Thyroid function studies No results for input(s): TSH, T4TOTAL, T3FREE, THYROIDAB in the last 72 hours.  Invalid input(s): FREET3 Anemia work up No results for input(s): VITAMINB12, FOLATE, FERRITIN, TIBC, IRON, RETICCTPCT in the last 72 hours. Microbiology Recent Results (from the past 240 hour(s))  Blood culture (routine single)     Status: None   Collection Time: 11/08/20  9:35 AM   Specimen: BLOOD  Result Value Ref Range Status   Specimen Description BLOOD RIGHT ARM  Final   Special Requests   Final    BOTTLES DRAWN AEROBIC AND ANAEROBIC Blood Culture adequate volume   Culture   Final    NO GROWTH 5 DAYS Performed at [facility], [address], [place], [place] [address]    Report  Status 11/13/2020 FINAL  Final  Resp Panel by RT-PCR (Flu A&B, Covid) Nasopharyngeal Swab     Status: None   Collection Time: 11/08/20 12:00 PM   Specimen: Nasopharyngeal Swab; Nasopharyngeal(NP) swabs in vial transport medium  Result Value Ref Range Status   SARS Coronavirus 2 by RT PCR NEGATIVE NEGATIVE Final    Comment: (NOTE) SARS-CoV-2 target nucleic acids are NOT DETECTED.  The SARS-CoV-2 RNA is generally detectable in upper respiratory specimens during  the acute phase of infection. The lowest concentration of SARS-CoV-2 viral copies this assay can detect is 138 copies/mL. A negative result does not preclude SARS-Cov-2 infection and should not be used as the sole basis for treatment or other patient management decisions. A negative result may occur with  improper specimen collection/handling, submission of specimen other than nasopharyngeal swab, presence of viral mutation(s) within the areas targeted by this assay, and inadequate number of viral copies(<138 copies/mL). A negative result must be combined with clinical observations, patient history, and epidemiological information. The expected result is Negative.  Fact Sheet for Patients:  EntrepreneurPulse.com.au  Fact Sheet for Healthcare Providers:  IncredibleEmployment.be  This test is no t yet approved or cleared by the [place] FDA and  has been authorized for detection and/or diagnosis of SARS-CoV-2 by FDA under an Emergency Use Authorization (EUA). This EUA will remain  in effect (meaning this test can be used) for the duration of the COVID-19 declaration under Section 564(b)(1) of the Act, 21 U.S.C.section 360bbb-3(b)(1), unless the authorization is terminated  or revoked sooner.       Influenza A by PCR NEGATIVE NEGATIVE Final   Influenza B by PCR NEGATIVE NEGATIVE Final    Comment: (NOTE) The Xpert Xpress SARS-CoV-2/FLU/RSV plus assay is intended as an aid in the diagnosis of influenza from Nasopharyngeal swab specimens and should not be used as a sole basis for treatment. Nasal washings and aspirates are unacceptable for Xpert Xpress SARS-CoV-2/FLU/RSV testing.  Fact Sheet for Patients: EntrepreneurPulse.com.au  Fact Sheet for Healthcare Providers: IncredibleEmployment.be  This test is not yet approved or cleared by the [place] FDA and has been authorized for detection and/or  diagnosis of SARS-CoV-2 by FDA under an Emergency Use Authorization (EUA). This EUA will remain in effect (meaning this test can be used) for the duration of the COVID-19 declaration under Section 564(b)(1) of the Act, 21 U.S.C. section 360bbb-3(b)(1), unless the authorization is terminated or revoked.  Performed at [facility], [address] [place], [place] [address]   MRSA Next Gen by PCR, Nasal     Status: None   Collection Time: 11/08/20  3:33 PM   Specimen: Nasal Mucosa; Nasal Swab  Result Value Ref Range Status   MRSA by PCR Next Gen NOT DETECTED NOT DETECTED Final    Comment: (NOTE) The GeneXpert MRSA Assay (FDA approved for NASAL specimens only), is one component of a comprehensive MRSA colonization surveillance program. It is not intended to diagnose MRSA infection nor to guide or monitor treatment for MRSA infections. Test performance is not FDA approved in patients less than 1 years old. Performed at [facility], [address] [place], [place] [address]   Resp Panel by RT-PCR (Flu A&B, Covid) Nasopharyngeal Swab     Status: None   Collection Time: 11/14/20 12:26 PM   Specimen: Nasopharyngeal Swab; Nasopharyngeal(NP) swabs in vial transport medium  Result Value Ref Range Status   SARS Coronavirus 2 by RT PCR NEGATIVE NEGATIVE Final    Comment: (NOTE)  SARS-CoV-2 target nucleic acids are NOT DETECTED.  The SARS-CoV-2 RNA is generally detectable in upper respiratory specimens during the acute phase of infection. The lowest concentration of SARS-CoV-2 viral copies this assay can detect is 138 copies/mL. A negative result does not preclude SARS-Cov-2 infection and should not be used as the sole basis for treatment or other patient management decisions. A negative result may occur with  improper specimen collection/handling, submission of specimen other than nasopharyngeal swab, presence of viral mutation(s) within the areas targeted by this  assay, and inadequate number of viral copies(<138 copies/mL). A negative result must be combined with clinical observations, patient history, and epidemiological information. The expected result is Negative.  Fact Sheet for Patients:  EntrepreneurPulse.com.au  Fact Sheet for Healthcare Providers:  IncredibleEmployment.be  This test is no t yet approved or cleared by the [place] FDA and  has been authorized for detection and/or diagnosis of SARS-CoV-2 by FDA under an Emergency Use Authorization (EUA). This EUA will remain  in effect (meaning this test can be used) for the duration of the COVID-19 declaration under Section 564(b)(1) of the Act, 21 U.S.C.section 360bbb-3(b)(1), unless the authorization is terminated  or revoked sooner.       Influenza A by PCR NEGATIVE NEGATIVE Final   Influenza B by PCR NEGATIVE NEGATIVE Final    Comment: (NOTE) The Xpert Xpress SARS-CoV-2/FLU/RSV plus assay is intended as an aid in the diagnosis of influenza from Nasopharyngeal swab specimens and should not be used as a sole basis for treatment. Nasal washings and aspirates are unacceptable for Xpert Xpress SARS-CoV-2/FLU/RSV testing.  Fact Sheet for Patients: EntrepreneurPulse.com.au  Fact Sheet for Healthcare Providers: IncredibleEmployment.be  This test is not yet approved or cleared by the [place] FDA and has been authorized for detection and/or diagnosis of SARS-CoV-2 by FDA under an Emergency Use Authorization (EUA). This EUA will remain in effect (meaning this test can be used) for the duration of the COVID-19 declaration under Section 564(b)(1) of the Act, 21 U.S.C. section 360bbb-3(b)(1), unless the authorization is terminated or revoked.  Performed at [facility], [address] [place], [place] [address]      Signed: Terrilee Croak  [facility] 11/14/2020, 3:06  PM

## 2020-11-14 NOTE — Plan of Care (Signed)

## 2020-11-14 NOTE — Progress Notes (Addendum)
Progress Note  Patient Name: Benjamin Cordova Date of Encounter: 11/14/2020  Primary Cardiologist: Rozann Lesches, MD  Subjective   Denies chest pain or dyspnea.  Still wearing O2.  Didn't initially remember why he was hospitalized.  Inpatient Medications    Scheduled Meds:  Chlorhexidine Gluconate Cloth  6 each Topical Q0600   finasteride  5 mg Oral Daily   guaiFENesin  600 mg Oral BID   metoprolol succinate  50 mg Oral Daily   senna-docusate  1 tablet Oral QHS   simvastatin  40 mg Oral QHS   spironolactone  12.5 mg Oral Daily   tamsulosin  0.4 mg Oral q AM   torsemide  20 mg Oral BID   warfarin  8 mg Oral ONCE-1600   Warfarin - Pharmacist Dosing Inpatient   Does not apply q1600   Continuous Infusions:  PRN Meds: acetaminophen **OR** acetaminophen, ipratropium-albuterol, ondansetron **OR** ondansetron (ZOFRAN) IV, polyethylene glycol, traZODone   Vital Signs    Vitals:   11/13/20 1559 11/13/20 2012 11/14/20 0815 11/14/20 0822  BP: 106/68 102/62 130/73   Pulse: 87 94 91 94  Resp: 18 (!) 24 20   Temp: 98.2 F (36.8 C) 98.7 F (37.1 C) 98 F (36.7 C)   TempSrc:      SpO2: 96% 97% (!) 72% 100%  Weight:      Height:        Intake/Output Summary (Last 24 hours) at 11/14/2020 1038 Last data filed at 11/14/2020 0900 Gross per 24 hour  Intake 1175.53 ml  Output 2417 ml  Net -1241.47 ml   Filed Weights   11/10/20 0937 11/11/20 0332 11/13/20 0108  Weight: 100.4 kg 100.3 kg 102.9 kg    Physical Exam   GEN: Well nourished, well developed, in no acute distress.  HEENT: Grossly normal.  Neck: Supple, no JVD, carotid bruits, or masses. Cardiac: RRR, occasional ectopy heard.  No murmurs, rubs, or gallops. No clubbing, cyanosis.  Trace R ankle edema.  Radials 2+, DP/PT 1+ and equal bilaterally.  Respiratory:  Respirations regular and unlabored, bibasilar crackles. GI: Soft, nontender, nondistended, BS + x 4. MS: no deformity or atrophy. Skin: warm and dry, no  rash. Neuro:  Strength and sensation are intact. Psych: AAOx3.  Flat affect.  Labs    Chemistry Recent Labs  Lab 11/08/20 0935 11/09/20 0631 11/12/20 0420 11/13/20 1229 11/14/20 0442  NA 140   < > 139 138 140  K 5.4*   < > 4.2 3.7 3.7  CL 95*   < > 89* 87* 87*  CO2 33*   < > 43* 43* 44*  GLUCOSE 150*   < > 110* 139* 127*  BUN 35*   < > 42* 36* 32*  CREATININE 0.89   < > 0.91 0.88 0.85  CALCIUM 9.4   < > 8.8* 8.8* 9.2  PROT 7.6  --   --   --   --   ALBUMIN 3.8  --   --   --   --   AST 46*  --   --   --   --   ALT 31  --   --   --   --   ALKPHOS 67  --   --   --   --   BILITOT 0.6  --   --   --   --   GFRNONAA >60   < > >60 >60 >60  ANIONGAP 12   < > '7 8 9   '$ < > =  values in this interval not displayed.     Hematology Recent Labs  Lab 11/12/20 0420 11/13/20 0537 11/14/20 0442  WBC 8.0 8.1 8.8  RBC 3.00* 2.96* 3.35*  HGB 9.2* 9.4* 10.2*  HCT 30.0* 29.0* 32.0*  MCV 100.0 98.0 95.5  MCH 30.7 31.8 30.4  MCHC 30.7 32.4 31.9  RDW 13.2 13.3 13.2  PLT 143* 148* 163    Cardiac Enzymes  Recent Labs  Lab 11/08/20 0935 11/08/20 1120 11/08/20 1533 11/08/20 1813 11/10/20 1451  TROPONINIHS 449* 841* 2,805* 3,275* 861*      BNP Recent Labs  Lab 11/08/20 0935  BNP 1,087.0*    Lipids  Lab Results  Component Value Date   CHOL 118 (L) 03/05/2015   HDL 39 (L) 03/05/2015   LDLCALC 58 03/05/2015   TRIG 104 03/05/2015   CHOLHDL 3.0 03/05/2015    HbA1c  Lab Results  Component Value Date   HGBA1C 5.5 10/24/2020   Lab Results  Component Value Date   INR 2.3 (H) 11/14/2020   INR 2.0 (H) 11/13/2020   INR 1.8 (H) 11/12/2020     Radiology    DG Chest 1 View  Result Date: 11/10/2020 CLINICAL DATA:  Shortness of breath EXAM: CHEST  1 VIEW COMPARISON:  11/08/2020 chest x-ray and CT FINDINGS: Post sternotomy changes. Moderate bilateral pleural effusions increased compared to prior. Worsening airspace disease at the bases. Cardiomegaly with vascular congestion  and probable pulmonary edema. Aortic atherosclerosis. Right lower rib fracture. IMPRESSION: Cardiomegaly with vascular congestion and probable pulmonary edema. Increasing moderate bilateral pleural effusions and worsening basilar airspace disease Electronically Signed   By: Donavan Foil M.D.   On: 11/10/2020 21:46   DG Chest 2 View  Result Date: 11/13/2020 CLINICAL DATA:  Shortness of breath, respiratory distress. EXAM: CHEST - 2 VIEW COMPARISON:  Chest radiograph dated 11/10/2020. FINDINGS: The heart is enlarged. Vascular calcifications are seen in the aortic arch. Median sternotomy wires are redemonstrated. There are small bilateral pleural effusions with associated atelectasis/airspace disease. There is no pneumothorax. Degenerative changes are seen in the spine. Multiple right-sided rib fractures are redemonstrated. IMPRESSION: Cardiomegaly and small bilateral pleural effusions. Aortic Atherosclerosis (ICD10-I70.0). Electronically Signed   By: Zerita Boers M.D.   On: 11/13/2020 15:00    Telemetry    RSR to sinus tachycardia, 80's to low 100's. PVCs/couplets/3 beats of NSVT - Personally Reviewed  Cardiac Studies   Echo 10/07/2020 1. LVEF is severely depressed with severe hypokineis of the mid/distal  ventricle; distal anterior, dsital inferior and apical akinesis. Note apex  is not well seen in this bedside study COmpared to 2021 no significant  change . Left ventricular ejection  fraction, by estimation, is 20%%. The left ventricular internal cavity  size was severely dilated. Left ventricular diastolic parameters are  indeterminate.   2. Right ventricular systolic function is normal. The right ventricular  size is normal. There is normal pulmonary artery systolic pressure.   3. Trivial mitral valve regurgitation.   4. The aortic valve is tricuspid. Aortic valve regurgitation is not  visualized. Mild aortic valve sclerosis is present, with no evidence of  aortic valve stenosis.     Patient Profile     86 y.o. male with history of CAD/CABG, redo CABG 2016, chronic HFrEF due to ischemic cardiomyopathy complicated by LV thrombus on PTA Coumadin with EF 20% by echo 09/2020, HTN, HLD, and presenting with cough and shortness of breath with pna/sepsis diagnosed and cardiology consulted for elevated HsTrop and AOC HFrEF  at presentation on 8/9.  Assessment & Plan    1.  Acute on chronic HFrEF/ICM: EF of 20% by echo in July.  -1.1 L overnight and 8.3 L for admission.  Despite net negative, weights are recorded as being up 1.6 kg yesterday. Unfortunately, he doesn't think he'd be able to stand for a standing scale/weight.  He continues to have crackles on exam w/ trace RLE edema.  I will increase torsemide to '40mg'$  BID.  Cont ? blocker, spiro, torsemide.  2.  NSTEMI/CAD: Status post prior CABG with redo CABG in 2016.  He has not been having any chest pain.  In the setting of above, troponin peaked at 3275, felt to be demand ischemia.  Discussed findings with him again today.  He has reiterated that he would not be interested in cath stating, "I've had enough of them." Previously completed 48 hours of heparin.  Continue beta-blocker and statin therapy.  No aspirin in the setting of chronic warfarin.    3.  LV Thrombus: In the setting of ischemic cardiomyopathy/HFrEF.  On chronic Coumadin with therapeutic INR.  4.  Essential hypertension: Stable on beta-blocker, spironolactone, and torsemide.  5.  Hyperlipidemia: LDL of 136 in April 2022 (Care Everywhere).  He is on simvastatin 40 as an outpatient. In setting of NSTEMI, will change to lipitor 80.  6.  Normotcytic anemia:  stable.  Signed, Murray Hodgkins, NP  11/14/2020, 10:38 AM    For questions or updates, please contact   Please consult [email or web address] for contact info under Cardiology/STEMI.

## 2020-11-15 DIAGNOSIS — E785 Hyperlipidemia, unspecified: Secondary | ICD-10-CM | POA: Diagnosis not present

## 2020-11-15 DIAGNOSIS — L24A2 Irritant contact dermatitis due to fecal, urinary or dual incontinence: Secondary | ICD-10-CM | POA: Diagnosis not present

## 2020-11-15 DIAGNOSIS — I10 Essential (primary) hypertension: Secondary | ICD-10-CM | POA: Diagnosis not present

## 2020-11-21 DIAGNOSIS — R1312 Dysphagia, oropharyngeal phase: Secondary | ICD-10-CM | POA: Diagnosis not present

## 2020-11-21 DIAGNOSIS — R0689 Other abnormalities of breathing: Secondary | ICD-10-CM | POA: Diagnosis not present

## 2020-11-21 DIAGNOSIS — J9601 Acute respiratory failure with hypoxia: Secondary | ICD-10-CM | POA: Diagnosis not present

## 2020-11-21 DIAGNOSIS — R051 Acute cough: Secondary | ICD-10-CM | POA: Diagnosis not present

## 2020-11-21 DIAGNOSIS — Z7901 Long term (current) use of anticoagulants: Secondary | ICD-10-CM | POA: Diagnosis not present

## 2020-11-21 DIAGNOSIS — R0602 Shortness of breath: Secondary | ICD-10-CM | POA: Diagnosis not present

## 2020-11-21 DIAGNOSIS — I480 Paroxysmal atrial fibrillation: Secondary | ICD-10-CM | POA: Diagnosis not present

## 2020-11-22 DIAGNOSIS — K5900 Constipation, unspecified: Secondary | ICD-10-CM | POA: Diagnosis not present

## 2020-11-22 DIAGNOSIS — J84114 Acute interstitial pneumonitis: Secondary | ICD-10-CM | POA: Diagnosis not present

## 2020-11-24 ENCOUNTER — Other Ambulatory Visit: Payer: Self-pay

## 2020-11-24 ENCOUNTER — Ambulatory Visit: Payer: Medicare HMO | Attending: Family | Admitting: Family

## 2020-11-24 ENCOUNTER — Encounter: Payer: Self-pay | Admitting: Family

## 2020-11-24 VITALS — BP 111/63 | HR 96 | Resp 20 | Ht 68.0 in | Wt 217.3 lb

## 2020-11-24 DIAGNOSIS — Z96652 Presence of left artificial knee joint: Secondary | ICD-10-CM | POA: Insufficient documentation

## 2020-11-24 DIAGNOSIS — I110 Hypertensive heart disease with heart failure: Secondary | ICD-10-CM | POA: Diagnosis not present

## 2020-11-24 DIAGNOSIS — I480 Paroxysmal atrial fibrillation: Secondary | ICD-10-CM

## 2020-11-24 DIAGNOSIS — Z7901 Long term (current) use of anticoagulants: Secondary | ICD-10-CM | POA: Insufficient documentation

## 2020-11-24 DIAGNOSIS — I10 Essential (primary) hypertension: Secondary | ICD-10-CM

## 2020-11-24 DIAGNOSIS — Z8673 Personal history of transient ischemic attack (TIA), and cerebral infarction without residual deficits: Secondary | ICD-10-CM | POA: Diagnosis not present

## 2020-11-24 DIAGNOSIS — Z8249 Family history of ischemic heart disease and other diseases of the circulatory system: Secondary | ICD-10-CM | POA: Insufficient documentation

## 2020-11-24 DIAGNOSIS — I5032 Chronic diastolic (congestive) heart failure: Secondary | ICD-10-CM | POA: Diagnosis not present

## 2020-11-24 DIAGNOSIS — Z79899 Other long term (current) drug therapy: Secondary | ICD-10-CM | POA: Diagnosis not present

## 2020-11-24 DIAGNOSIS — Z951 Presence of aortocoronary bypass graft: Secondary | ICD-10-CM | POA: Insufficient documentation

## 2020-11-24 DIAGNOSIS — Z87891 Personal history of nicotine dependence: Secondary | ICD-10-CM | POA: Insufficient documentation

## 2020-11-24 DIAGNOSIS — K6289 Other specified diseases of anus and rectum: Secondary | ICD-10-CM | POA: Diagnosis not present

## 2020-11-24 DIAGNOSIS — I5042 Chronic combined systolic (congestive) and diastolic (congestive) heart failure: Secondary | ICD-10-CM

## 2020-11-24 NOTE — Progress Notes (Signed)
Patient ID: Benjamin Cordova, male    DOB: May 09, 1934, 86 y.o.   MRN: [id number]  HPI  Benjamin Cordova is a 86 y/o male with a history of CAD, hyperlipidemia, HTN, stroke, PVD, mural thrombus, NSVT, paroxysmal atrial fibrillation and chronic heart failure.   Echo report from 10/07/20 shows EF of 20%.   LHC done 10/05/14 and showed: Prox LAD lesion, 80% stenosed. Mid LAD lesion, 100% stenosed. 1st Diag-1 lesion, 90% stenosed. 1st Diag-2 lesion, 100% stenosed. 1st Mrg lesion, 100% stenosed. 2nd Mrg lesion, 90% stenosed. Prox RCA to Mid RCA lesion, 90% stenosed. Mid RCA to Dist RCA lesion, 100% stenosed. Acute Mrg lesion, 90% stenosed. SVG . Origin lesion, before 1st Diag, 100% stenosed. Origin lesion, 100% stenosed. Origin lesion, 100% stenosed. There is moderate to severe left ventricular systolic dysfunction.  1. Severe 3 vessel obstructive/occlusive CAD. 2. All grafts are occluded including SVG to diagonal/LAD, SVG to OM1, and SVG to RCA. 3. Moderate to severe LV dysfunction. 4. The LIMA is widely patent as was not used previously for bypass.  Admitted 11/08/20 due to altered mental status and hypoxia. Placed of 15L nonbreather mask. Head CT negative. Initially given IV lasix and then transitioned to oral diuretics. Cardiology consult obtained. Initially placed on bipap. Discharged after 6 days. Admitted 10/23/20 due to hypoxia. Initially placed on bipap. Given IV lasix with transition to oral diuretics. Transitioned off bipap to 4L and then weaned down to 2L. Palliative care consult obtained. Discharged after 5 days.   Benjamin Cordova presents today for his initial visit with a chief complaint of moderate shortness of breath with little exertion. Benjamin Cordova says that Benjamin Cordova's been short of breath for "awhile". Benjamin Cordova has associated cough, palpitations, easy bruising and rectal pain along with this. Benjamin Cordova denies any difficulty sleeping, dizziness, abdominal distention, pedal edema, chest pain or fatigue.   Benjamin Cordova says that Benjamin Cordova was  weighed this morning at [facility] but doesn't think Benjamin Cordova gets weighed every day. Benjamin Cordova did not send medication list and patient is not sure of what Benjamin Cordova's taking but thinks Benjamin Cordova's taking everything that is on our list.   Says that Benjamin Cordova's receiving PT every day. Complains of rectal pain from sitting in the wheelchair.   Past Medical History:  Diagnosis Date   Arrhythmia    atrial fibrillation   Arthritis    CHF (congestive heart failure) (HCC)    Coronary artery disease    a. s/p CABG in 1981 b. 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34   Affected right leg   Skin cancer    TIA (transient ischemic attack) 1989   Chronic coumadin   UTI (lower urinary tract infection)    Past Surgical History:  Procedure Laterality Date   CARDIAC CATHETERIZATION N/A 10/05/2014   Procedure: Left Heart Cath and Coronary Angiography;  Surgeon: Peter M Martinique, MD;  Location: [place] [facility];  Service: Cardiovascular;  Laterality: N/A;   [place]   CORONARY ARTERY  BYPASS GRAFT N/A 10/08/2014   Procedure: REDO CORONARY ARTERY BYPASS GRAFTING (CABG) Times Two Grafts using Left Internal Mammary and Left Radial Artery;  Surgeon: Melrose Nakayama, MD;  Location: [place];  Service: Open Heart Surgery;  Laterality: N/A;   CYSTOSCOPY WITH INJECTION  10/10/2010   Procedure: CYSTOSCOPY WITH INJECTION;  Surgeon: Marissa Nestle;  Location: [facility];  Service: Urology;  Laterality: N/A;  with retrograde urethrogram   FOOT SURGERY Right 1954   [facility], muscle implantation    JOINT REPLACEMENT     RADIAL ARTERY HARVEST Left 10/08/2014   Procedure: Left RADIAL ARTERY HARVEST;  Surgeon: Melrose Nakayama, MD;  Location: [place];  Service: Open Heart Surgery;  Laterality: Left;   TEE WITHOUT CARDIOVERSION N/A 10/08/2014   Procedure: TRANSESOPHAGEAL ECHOCARDIOGRAM (TEE);  Surgeon: Melrose Nakayama, MD;  Location: [place];  Service: Open Heart Surgery;  Laterality: N/A;   TONSILLECTOMY  1949   [facility]   TOTAL KNEE ARTHROPLASTY Left 07/27/2015   Procedure: TOTAL LEFT KNEE ARTHROPLASTY;  Surgeon: [name], MD;  Location: [place];  Service: Orthopedics;  Laterality: Left;   Family History  Problem Relation Age of Onset   Heart attack Mother    Diabetes Mother    Heart attack Father    Heart attack Brother    Anesthesia problems Neg Hx    Hypotension Neg Hx    Malignant hyperthermia Neg Hx    Pseudochol deficiency Neg Hx    Social History   Tobacco Use   Smoking status: Former    Packs/day: 1.00    Years: 28.00    Pack years: 28.00    Types: Cigarettes    Quit date: 09/22/1978    Years since quitting: 42.2   Smokeless tobacco: Never  Substance Use Topics   Alcohol use: No    Alcohol/week: 0.0 standard drinks   No Known Allergies Prior to Admission medications   Medication Sig Start Date End Date Taking? Authorizing Provider  acetaminophen (TYLENOL) 325 MG tablet Take 2 tablets (650 mg total) by mouth every 6 (six) hours. 10/11/20  Yes Johnson, Clanford L, MD  albuterol (PROVENTIL) (2.5 MG/3ML) 0.083% nebulizer solution Take 2.5 mg by nebulization every 6 (six) hours as needed for wheezing or shortness of breath.  08/29/19  Yes [provider]  ascorbic acid (VITAMIN C) 500 MG tablet Take 1,000 mg by mouth 2 (two) times daily.   Yes [provider]  atorvastatin (LIPITOR) 80 MG tablet Take 1 tablet (80 mg total) by mouth at bedtime. 11/14/20  Yes Dahal, Marlowe Aschoff, MD  diphenhydrAMINE (BENADRYL) 50 MG capsule Take 1 capsule (50 mg total) by  mouth at bedtime as needed for sleep. 10/11/20  Yes Johnson, Clanford L, MD  finasteride (PROSCAR) 5 MG tablet Take 5 mg by mouth daily. 04/20/19  Yes [provider]  folic acid (FOLVITE) Q000111Q MCG tablet Take 800 mcg by mouth daily.   Yes [provider]  guaiFENesin (MUCINEX) 600 MG 12 hr tablet Take 1 tablet (600 mg total) by mouth 2 (two) times daily. 11/14/20  Yes Dahal, Marlowe Aschoff, MD  ipratropium-albuterol (DUONEB) 0.5-2.5 (3) MG/3ML SOLN Take 3 mLs by nebulization every 4 (four) hours as needed (SOB/wheezing).   Yes [provider]  metoprolol succinate (TOPROL-XL) 50 MG 24 hr tablet Take 1 tablet (50 mg total) by mouth daily. Take with or immediately following a meal. 10/29/20  Yes Tat, David, MD  Multiple Vitamins-Minerals (CERTAVITE/ANTIOXIDANTS) TABS Take 1 tablet by mouth  daily.    Yes [provider]  polyethylene glycol (MIRALAX / GLYCOLAX) 17 g packet Take 17 g by mouth daily as needed for moderate constipation. 11/14/20  Yes Dahal, Marlowe Aschoff, MD  potassium chloride (K-DUR) 10 MEQ tablet Take 10 mEq by mouth daily. Take with Lasix (Furosemide) 03/18/18  Yes [provider]  senna-docusate (SENOKOT-S) 8.6-50 MG tablet Take 1 tablet by mouth at bedtime. 10/11/20  Yes Johnson, Clanford L, MD  spironolactone (ALDACTONE) 25 MG tablet Take 0.5 tablets (12.5 mg total) by mouth daily. 11/14/20 02/12/21 Yes Dahal, Marlowe Aschoff, MD  tamsulosin (FLOMAX) 0.4 MG CAPS capsule Take 0.4 mg by mouth in the morning.  12/02/18  Yes [provider]  torsemide 40 MG TABS Take 40 mg by mouth 2 (two) times daily. 11/14/20  Yes Dahal, Marlowe Aschoff, MD  warfarin (COUMADIN) 4 MG tablet Take 2 tablets (8 mg total) by mouth one time only at 4 PM. 11/14/20  Yes Dahal, Marlowe Aschoff, MD    Review of Systems  Constitutional:  Negative for appetite change and fatigue.  HENT:  Positive for hearing loss. Negative for congestion, postnasal drip and sore throat.   Eyes: Negative.   Respiratory:   Positive for cough and shortness of breath (easily).   Cardiovascular:  Positive for palpitations (at times). Negative for chest pain and leg swelling.  Gastrointestinal:  Negative for abdominal distention and abdominal pain.  Endocrine: Negative.   Genitourinary:        Foley catheter in place  Musculoskeletal:  Negative for back pain and neck pain.       Rectal pain  Skin: Negative.   Allergic/Immunologic: Negative.   Neurological:  Negative for dizziness and light-headedness.  Hematological:  Negative for adenopathy. Bruises/bleeds easily.  Psychiatric/Behavioral:  Negative for dysphoric mood and sleep disturbance (sleeping on 1 pillow). The patient is not nervous/anxious.    Vitals:   11/24/20 0943  BP: 111/63  Pulse: 96  Resp: 20  SpO2: 98%  Weight: 217 lb 5 oz (98.6 kg)  Height: '5\' 8"'$  (1.727 m)   Wt Readings from Last 3 Encounters:  11/24/20 217 lb 5 oz (98.6 kg)  11/13/20 226 lb 14.4 oz (102.9 kg)  10/28/20 232 lb 12.9 oz (105.6 kg)    Physical Exam Vitals and nursing note reviewed.  Constitutional:      Appearance: Normal appearance.  HENT:     Head: Normocephalic and atraumatic.     Right Ear: Decreased hearing noted.     Left Ear: Decreased hearing noted.  Cardiovascular:     Rate and Rhythm: Normal rate. Rhythm irregular.  Pulmonary:     Effort: Pulmonary effort is normal. No respiratory distress.     Breath sounds: No wheezing or rales.  Abdominal:     General: There is no distension.     Palpations: Abdomen is soft.     Tenderness: There is no abdominal tenderness.  Musculoskeletal:        General: Tenderness present.     Cervical back: Normal range of motion and neck supple.     Right lower leg: No edema.     Left lower leg: No edema.  Skin:    General: Skin is warm and dry.     Findings: Bruising (on arms) present.  Neurological:     General: No focal deficit present.     Mental Status: Benjamin Cordova is alert. Mental status is at baseline.  Psychiatric:         Mood and Affect: Mood normal.  Behavior: Behavior normal.   Assessment & Plan:  1: Chronic heart failure with reduced ejection fraction- - NYHA class III - euvolemic today - order written for patient to be weighed daily and to call for an overnight weight gain of >2 pounds or a weekly weight gain of > 5 pounds - Benjamin Cordova says that Benjamin Cordova's not adding salt to his food; reports having sausage, eggs, biscuit, coffee and juice this morning for breakfast - Benjamin Cordova says that Benjamin Cordova's receiving PT daily - by our list Benjamin Cordova's on GDMT of metoprolol succinate and spironolactone - BP may not be able to tolerate entresto and I need to be sure of exactly what Benjamin Cordova's taking; tried calling Benjamin Cordova to get med record faxed but could never reach anyone - consider SGLT2 with reduction of torsemide after med list confirmed - BNP 11/08/20 was 1087.0  2: HTN- - BP looks good today (111/63) - seeing PCP (Benjamin Cordova) - BMP 11/14/20 showed sodium 140, potassium 3.7, creatinine 0.85 & GFR >60  3: Paroxsymal atrial fibrillation- - to see cardiology Benjamin Cordova) 12/14/20 - on warfarin - INR on 11/14/20 was 2.3   Benjamin Cordova did not send [facility] with patient and Benjamin Cordova seems unsure of what Benjamin Cordova's taking. Order written for them to send [facility] with patient every time.   Return in 6 weeks or sooner for any questions/problems before then.

## 2020-11-24 NOTE — Patient Instructions (Addendum)
Begin weighing daily and call for an overnight weight gain of > 2 pounds or a weekly weight gain of >5 pounds.    Please Fax Patients medication list too [phone]

## 2020-12-02 DIAGNOSIS — I5020 Unspecified systolic (congestive) heart failure: Secondary | ICD-10-CM | POA: Diagnosis not present

## 2020-12-05 DIAGNOSIS — I5020 Unspecified systolic (congestive) heart failure: Secondary | ICD-10-CM | POA: Diagnosis not present

## 2020-12-06 ENCOUNTER — Emergency Department: Payer: Medicare HMO

## 2020-12-06 ENCOUNTER — Inpatient Hospital Stay: Payer: Medicare HMO

## 2020-12-06 ENCOUNTER — Inpatient Hospital Stay
Admission: EM | Admit: 2020-12-06 | Discharge: 2020-12-31 | DRG: 291 | Disposition: E | Payer: Medicare HMO | Attending: Internal Medicine | Admitting: Internal Medicine

## 2020-12-06 ENCOUNTER — Other Ambulatory Visit: Payer: Self-pay

## 2020-12-06 DIAGNOSIS — Z7901 Long term (current) use of anticoagulants: Secondary | ICD-10-CM

## 2020-12-06 DIAGNOSIS — Z87891 Personal history of nicotine dependence: Secondary | ICD-10-CM

## 2020-12-06 DIAGNOSIS — I472 Ventricular tachycardia: Secondary | ICD-10-CM | POA: Diagnosis not present

## 2020-12-06 DIAGNOSIS — I110 Hypertensive heart disease with heart failure: Principal | ICD-10-CM | POA: Diagnosis present

## 2020-12-06 DIAGNOSIS — I672 Cerebral atherosclerosis: Secondary | ICD-10-CM | POA: Diagnosis not present

## 2020-12-06 DIAGNOSIS — I2510 Atherosclerotic heart disease of native coronary artery without angina pectoris: Secondary | ICD-10-CM | POA: Diagnosis present

## 2020-12-06 DIAGNOSIS — D649 Anemia, unspecified: Secondary | ICD-10-CM | POA: Diagnosis not present

## 2020-12-06 DIAGNOSIS — R069 Unspecified abnormalities of breathing: Secondary | ICD-10-CM | POA: Diagnosis not present

## 2020-12-06 DIAGNOSIS — Z79899 Other long term (current) drug therapy: Secondary | ICD-10-CM

## 2020-12-06 DIAGNOSIS — I50813 Acute on chronic right heart failure: Secondary | ICD-10-CM

## 2020-12-06 DIAGNOSIS — I462 Cardiac arrest due to underlying cardiac condition: Secondary | ICD-10-CM | POA: Diagnosis not present

## 2020-12-06 DIAGNOSIS — Z20822 Contact with and (suspected) exposure to covid-19: Secondary | ICD-10-CM | POA: Diagnosis not present

## 2020-12-06 DIAGNOSIS — Z96652 Presence of left artificial knee joint: Secondary | ICD-10-CM | POA: Diagnosis present

## 2020-12-06 DIAGNOSIS — I5023 Acute on chronic systolic (congestive) heart failure: Secondary | ICD-10-CM

## 2020-12-06 DIAGNOSIS — N136 Pyonephrosis: Secondary | ICD-10-CM | POA: Diagnosis present

## 2020-12-06 DIAGNOSIS — J9622 Acute and chronic respiratory failure with hypercapnia: Secondary | ICD-10-CM | POA: Diagnosis not present

## 2020-12-06 DIAGNOSIS — I517 Cardiomegaly: Secondary | ICD-10-CM | POA: Diagnosis not present

## 2020-12-06 DIAGNOSIS — R0603 Acute respiratory distress: Secondary | ICD-10-CM | POA: Diagnosis not present

## 2020-12-06 DIAGNOSIS — N1330 Unspecified hydronephrosis: Secondary | ICD-10-CM | POA: Diagnosis not present

## 2020-12-06 DIAGNOSIS — E782 Mixed hyperlipidemia: Secondary | ICD-10-CM | POA: Diagnosis present

## 2020-12-06 DIAGNOSIS — I739 Peripheral vascular disease, unspecified: Secondary | ICD-10-CM | POA: Diagnosis present

## 2020-12-06 DIAGNOSIS — R0600 Dyspnea, unspecified: Secondary | ICD-10-CM | POA: Diagnosis not present

## 2020-12-06 DIAGNOSIS — J9601 Acute respiratory failure with hypoxia: Secondary | ICD-10-CM | POA: Diagnosis present

## 2020-12-06 DIAGNOSIS — J90 Pleural effusion, not elsewhere classified: Secondary | ICD-10-CM | POA: Diagnosis not present

## 2020-12-06 DIAGNOSIS — I252 Old myocardial infarction: Secondary | ICD-10-CM | POA: Diagnosis not present

## 2020-12-06 DIAGNOSIS — N3289 Other specified disorders of bladder: Secondary | ICD-10-CM | POA: Diagnosis not present

## 2020-12-06 DIAGNOSIS — J9621 Acute and chronic respiratory failure with hypoxia: Secondary | ICD-10-CM | POA: Diagnosis present

## 2020-12-06 DIAGNOSIS — Z8673 Personal history of transient ischemic attack (TIA), and cerebral infarction without residual deficits: Secondary | ICD-10-CM | POA: Diagnosis not present

## 2020-12-06 DIAGNOSIS — I493 Ventricular premature depolarization: Secondary | ICD-10-CM | POA: Diagnosis present

## 2020-12-06 DIAGNOSIS — Z85828 Personal history of other malignant neoplasm of skin: Secondary | ICD-10-CM

## 2020-12-06 DIAGNOSIS — Z66 Do not resuscitate: Secondary | ICD-10-CM | POA: Diagnosis not present

## 2020-12-06 DIAGNOSIS — R140 Abdominal distension (gaseous): Secondary | ICD-10-CM | POA: Diagnosis not present

## 2020-12-06 DIAGNOSIS — I513 Intracardiac thrombosis, not elsewhere classified: Secondary | ICD-10-CM | POA: Diagnosis not present

## 2020-12-06 DIAGNOSIS — N281 Cyst of kidney, acquired: Secondary | ICD-10-CM | POA: Diagnosis not present

## 2020-12-06 DIAGNOSIS — R0902 Hypoxemia: Secondary | ICD-10-CM | POA: Diagnosis not present

## 2020-12-06 DIAGNOSIS — I10 Essential (primary) hypertension: Secondary | ICD-10-CM | POA: Diagnosis not present

## 2020-12-06 DIAGNOSIS — I5082 Biventricular heart failure: Secondary | ICD-10-CM | POA: Diagnosis present

## 2020-12-06 DIAGNOSIS — I255 Ischemic cardiomyopathy: Secondary | ICD-10-CM | POA: Diagnosis present

## 2020-12-06 DIAGNOSIS — R0689 Other abnormalities of breathing: Secondary | ICD-10-CM | POA: Diagnosis not present

## 2020-12-06 DIAGNOSIS — S2231XA Fracture of one rib, right side, initial encounter for closed fracture: Secondary | ICD-10-CM | POA: Diagnosis not present

## 2020-12-06 DIAGNOSIS — I480 Paroxysmal atrial fibrillation: Secondary | ICD-10-CM | POA: Diagnosis present

## 2020-12-06 DIAGNOSIS — Z951 Presence of aortocoronary bypass graft: Secondary | ICD-10-CM

## 2020-12-06 DIAGNOSIS — Z515 Encounter for palliative care: Secondary | ICD-10-CM | POA: Diagnosis not present

## 2020-12-06 DIAGNOSIS — N134 Hydroureter: Secondary | ICD-10-CM | POA: Diagnosis not present

## 2020-12-06 DIAGNOSIS — J341 Cyst and mucocele of nose and nasal sinus: Secondary | ICD-10-CM | POA: Diagnosis not present

## 2020-12-06 DIAGNOSIS — I5043 Acute on chronic combined systolic (congestive) and diastolic (congestive) heart failure: Secondary | ICD-10-CM | POA: Diagnosis present

## 2020-12-06 DIAGNOSIS — K5900 Constipation, unspecified: Secondary | ICD-10-CM | POA: Diagnosis not present

## 2020-12-06 DIAGNOSIS — R404 Transient alteration of awareness: Secondary | ICD-10-CM | POA: Diagnosis not present

## 2020-12-06 DIAGNOSIS — R000 Tachycardia, unspecified: Secondary | ICD-10-CM | POA: Diagnosis not present

## 2020-12-06 DIAGNOSIS — I4729 Other ventricular tachycardia: Secondary | ICD-10-CM

## 2020-12-06 DIAGNOSIS — J9811 Atelectasis: Secondary | ICD-10-CM | POA: Diagnosis not present

## 2020-12-06 DIAGNOSIS — R54 Age-related physical debility: Secondary | ICD-10-CM | POA: Diagnosis present

## 2020-12-06 DIAGNOSIS — N400 Enlarged prostate without lower urinary tract symptoms: Secondary | ICD-10-CM | POA: Diagnosis present

## 2020-12-06 DIAGNOSIS — Z8249 Family history of ischemic heart disease and other diseases of the circulatory system: Secondary | ICD-10-CM

## 2020-12-06 DIAGNOSIS — J80 Acute respiratory distress syndrome: Secondary | ICD-10-CM | POA: Diagnosis not present

## 2020-12-06 LAB — BLOOD GAS, ARTERIAL
Acid-Base Excess: 21.3 mmol/L — ABNORMAL HIGH (ref 0.0–2.0)
Bicarbonate: 52.2 mmol/L — ABNORMAL HIGH (ref 20.0–28.0)
FIO2: 100
O2 Saturation: 97.4 %
Patient temperature: 37
pCO2 arterial: 106 mmHg (ref 32.0–48.0)
pH, Arterial: 7.3 — ABNORMAL LOW (ref 7.350–7.450)
pO2, Arterial: 103 mmHg (ref 83.0–108.0)

## 2020-12-06 LAB — URINALYSIS, ROUTINE W REFLEX MICROSCOPIC
Bacteria, UA: NONE SEEN
Bilirubin Urine: NEGATIVE
Glucose, UA: NEGATIVE mg/dL
Ketones, ur: NEGATIVE mg/dL
Nitrite: NEGATIVE
Protein, ur: 100 mg/dL — AB
Specific Gravity, Urine: 1.015 (ref 1.005–1.030)
pH: >9 — ABNORMAL HIGH (ref 5.0–8.0)

## 2020-12-06 LAB — HEPATIC FUNCTION PANEL
ALT: 18 U/L (ref 0–44)
AST: 19 U/L (ref 15–41)
Albumin: 3.9 g/dL (ref 3.5–5.0)
Alkaline Phosphatase: 54 U/L (ref 38–126)
Bilirubin, Direct: 0.2 mg/dL (ref 0.0–0.2)
Indirect Bilirubin: 0.6 mg/dL (ref 0.3–0.9)
Total Bilirubin: 0.8 mg/dL (ref 0.3–1.2)
Total Protein: 6.8 g/dL (ref 6.5–8.1)

## 2020-12-06 LAB — TROPONIN I (HIGH SENSITIVITY)
Troponin I (High Sensitivity): 66 ng/L — ABNORMAL HIGH (ref <18)
Troponin I (High Sensitivity): 66 ng/L — ABNORMAL HIGH (ref <18)

## 2020-12-06 LAB — VITAMIN B12: Vitamin B-12: 744 pg/mL (ref 180–914)

## 2020-12-06 LAB — CBC WITH DIFFERENTIAL/PLATELET
Abs Immature Granulocytes: 0.35 10*3/uL — ABNORMAL HIGH (ref 0.00–0.07)
Basophils Absolute: 0 10*3/uL (ref 0.0–0.1)
Basophils Relative: 0 %
Eosinophils Absolute: 0 10*3/uL (ref 0.0–0.5)
Eosinophils Relative: 0 %
HCT: 34.4 % — ABNORMAL LOW (ref 39.0–52.0)
Hemoglobin: 10.7 g/dL — ABNORMAL LOW (ref 13.0–17.0)
Immature Granulocytes: 3 %
Lymphocytes Relative: 3 %
Lymphs Abs: 0.3 10*3/uL — ABNORMAL LOW (ref 0.7–4.0)
MCH: 30.5 pg (ref 26.0–34.0)
MCHC: 31.1 g/dL (ref 30.0–36.0)
MCV: 98 fL (ref 80.0–100.0)
Monocytes Absolute: 0.5 10*3/uL (ref 0.1–1.0)
Monocytes Relative: 4 %
Neutro Abs: 11.6 10*3/uL — ABNORMAL HIGH (ref 1.7–7.7)
Neutrophils Relative %: 90 %
Platelets: 172 10*3/uL (ref 150–400)
RBC: 3.51 MIL/uL — ABNORMAL LOW (ref 4.22–5.81)
RDW: 14.4 % (ref 11.5–15.5)
WBC: 12.8 10*3/uL — ABNORMAL HIGH (ref 4.0–10.5)
nRBC: 0 % (ref 0.0–0.2)

## 2020-12-06 LAB — CBG MONITORING, ED: Glucose-Capillary: 250 mg/dL — ABNORMAL HIGH (ref 70–99)

## 2020-12-06 LAB — IRON AND TIBC
Iron: 33 ug/dL — ABNORMAL LOW (ref 45–182)
Saturation Ratios: 9 % — ABNORMAL LOW (ref 17.9–39.5)
TIBC: 357 ug/dL (ref 250–450)
UIBC: 324 ug/dL

## 2020-12-06 LAB — BASIC METABOLIC PANEL
Anion gap: 9 (ref 5–15)
BUN: 45 mg/dL — ABNORMAL HIGH (ref 8–23)
CO2: 43 mmol/L — ABNORMAL HIGH (ref 22–32)
Calcium: 8.9 mg/dL (ref 8.9–10.3)
Chloride: 86 mmol/L — ABNORMAL LOW (ref 98–111)
Creatinine, Ser: 1.08 mg/dL (ref 0.61–1.24)
GFR, Estimated: >60 mL/min (ref >60)
Glucose, Bld: 157 mg/dL — ABNORMAL HIGH (ref 70–99)
Potassium: 4.4 mmol/L (ref 3.5–5.1)
Sodium: 138 mmol/L (ref 135–145)

## 2020-12-06 LAB — FOLATE: Folate: 51.3 ng/mL (ref >5.9)

## 2020-12-06 LAB — PROTIME-INR
INR: 2.1 — ABNORMAL HIGH (ref 0.8–1.2)
Prothrombin Time: 23.3 seconds — ABNORMAL HIGH (ref 11.4–15.2)

## 2020-12-06 LAB — TSH: TSH: 1.891 u[IU]/mL (ref 0.350–4.500)

## 2020-12-06 LAB — BRAIN NATRIURETIC PEPTIDE: B Natriuretic Peptide: 1508.9 pg/mL — ABNORMAL HIGH (ref 0.0–100.0)

## 2020-12-06 LAB — RESP PANEL BY RT-PCR (FLU A&B, COVID) ARPGX2
Influenza A by PCR: NEGATIVE
Influenza B by PCR: NEGATIVE
SARS Coronavirus 2 by RT PCR: NEGATIVE

## 2020-12-06 LAB — MAGNESIUM: Magnesium: 2.4 mg/dL (ref 1.7–2.4)

## 2020-12-06 LAB — RETICULOCYTES
Immature Retic Fract: 28.8 % — ABNORMAL HIGH (ref 2.3–15.9)
RBC.: 3.45 MIL/uL — ABNORMAL LOW (ref 4.22–5.81)
Retic Count, Absolute: 158 10*3/uL (ref 19.0–186.0)
Retic Ct Pct: 4.6 % — ABNORMAL HIGH (ref 0.4–3.1)

## 2020-12-06 LAB — LACTIC ACID, PLASMA
Lactic Acid, Venous: 1.4 mmol/L (ref 0.5–1.9)
Lactic Acid, Venous: 1.2 mmol/L (ref 0.5–1.9)

## 2020-12-06 LAB — FERRITIN: Ferritin: 61 ng/mL (ref 24–336)

## 2020-12-06 LAB — T4, FREE: Free T4: 0.75 ng/dL (ref 0.61–1.12)

## 2020-12-06 LAB — PROCALCITONIN: Procalcitonin: <0.1 ng/mL

## 2020-12-06 MED ORDER — SODIUM CHLORIDE 0.9 % IV SOLN: 250.0000 mL | INTRAVENOUS | Status: DC | PRN

## 2020-12-06 MED ORDER — SPIRONOLACTONE 25 MG PO TABS
12.5000 mg | ORAL_TABLET | Freq: Every day | ORAL | Status: DC
  Administered 2020-12-06: 12.5 mg via ORAL
  Filled 2020-12-06: qty 0.5

## 2020-12-06 MED ORDER — GLYCOPYRROLATE 0.2 MG/ML IJ SOLN
0.2000 mg | INTRAMUSCULAR | Status: DC | PRN
  Filled 2020-12-06 (×2): qty 1

## 2020-12-06 MED ORDER — ONDANSETRON HCL 4 MG/2ML IJ SOLN: 4.0000 mg | Freq: Four times a day (QID) | INTRAMUSCULAR | Status: DC | PRN

## 2020-12-06 MED ORDER — AMIODARONE HCL IN DEXTROSE 360-4.14 MG/200ML-% IV SOLN
30.0000 mg/h | INTRAVENOUS | Status: DC
  Administered 2020-12-06 (×2): 30 mg/h via INTRAVENOUS
  Filled 2020-12-06: qty 200

## 2020-12-06 MED ORDER — ASCORBIC ACID 500 MG PO TABS
1000.0000 mg | ORAL_TABLET | Freq: Two times a day (BID) | ORAL | Status: DC
  Administered 2020-12-06: 1000 mg via ORAL
  Filled 2020-12-06: qty 2

## 2020-12-06 MED ORDER — IPRATROPIUM-ALBUTEROL 0.5-2.5 (3) MG/3ML IN SOLN: 3.0000 mL | RESPIRATORY_TRACT | Status: DC | PRN

## 2020-12-06 MED ORDER — HALOPERIDOL LACTATE 5 MG/ML IJ SOLN: 0.5000 mg | INTRAMUSCULAR | Status: DC | PRN

## 2020-12-06 MED ORDER — LORAZEPAM 1 MG PO TABS: 1.0000 mg | ORAL_TABLET | ORAL | Status: DC | PRN

## 2020-12-06 MED ORDER — FUROSEMIDE 10 MG/ML IJ SOLN
40.0000 mg | Freq: Once | INTRAMUSCULAR | Status: AC
  Administered 2020-12-06: 40 mg via INTRAVENOUS
  Filled 2020-12-06: qty 4

## 2020-12-06 MED ORDER — LORAZEPAM 2 MG/ML PO CONC: 1.0000 mg | ORAL | Status: DC | PRN

## 2020-12-06 MED ORDER — ACETAMINOPHEN 325 MG PO TABS: 650.0000 mg | ORAL_TABLET | Freq: Four times a day (QID) | ORAL | Status: DC | PRN

## 2020-12-06 MED ORDER — HYDRALAZINE HCL 20 MG/ML IJ SOLN: 10.0000 mg | Freq: Four times a day (QID) | INTRAMUSCULAR | Status: DC | PRN

## 2020-12-06 MED ORDER — BIOTENE DRY MOUTH MT LIQD
15.0000 mL | OROMUCOSAL | Status: DC | PRN
  Filled 2020-12-06: qty 15

## 2020-12-06 MED ORDER — AMIODARONE IV BOLUS ONLY 150 MG/100ML
INTRAVENOUS | Status: AC
  Filled 2020-12-06: qty 100

## 2020-12-06 MED ORDER — WARFARIN - PHARMACIST DOSING INPATIENT
Freq: Every day | Status: DC
  Filled 2020-12-06: qty 1

## 2020-12-06 MED ORDER — FINASTERIDE 5 MG PO TABS
5.0000 mg | ORAL_TABLET | Freq: Every day | ORAL | Status: DC
  Administered 2020-12-06: 5 mg via ORAL
  Filled 2020-12-06: qty 1

## 2020-12-06 MED ORDER — SODIUM CHLORIDE 0.9% FLUSH: 3.0000 mL | Freq: Two times a day (BID) | INTRAVENOUS | Status: DC

## 2020-12-06 MED ORDER — METOPROLOL SUCCINATE ER 50 MG PO TB24
50.0000 mg | ORAL_TABLET | Freq: Every day | ORAL | Status: DC
  Administered 2020-12-06: 50 mg via ORAL
  Filled 2020-12-06: qty 1

## 2020-12-06 MED ORDER — ACETAMINOPHEN 650 MG RE SUPP: 650.0000 mg | Freq: Four times a day (QID) | RECTAL | Status: DC | PRN

## 2020-12-06 MED ORDER — ADULT MULTIVITAMIN W/MINERALS CH
1.0000 | ORAL_TABLET | Freq: Every day | ORAL | Status: DC
  Administered 2020-12-06: 1 via ORAL
  Filled 2020-12-06: qty 1

## 2020-12-06 MED ORDER — SODIUM CHLORIDE 0.9% FLUSH: 3.0000 mL | INTRAVENOUS | Status: DC | PRN

## 2020-12-06 MED ORDER — TAMSULOSIN HCL 0.4 MG PO CAPS: 0.4000 mg | ORAL_CAPSULE | Freq: Every morning | ORAL | Status: DC

## 2020-12-06 MED ORDER — SODIUM CHLORIDE 0.9 % IV SOLN: 1.0000 g | INTRAVENOUS | Status: DC

## 2020-12-06 MED ORDER — AMIODARONE LOAD VIA INFUSION
150.0000 mg | Freq: Once | INTRAVENOUS | Status: AC
  Administered 2020-12-06: 150 mg via INTRAVENOUS
  Filled 2020-12-06: qty 83.34

## 2020-12-06 MED ORDER — ATORVASTATIN CALCIUM 20 MG PO TABS: 80.0000 mg | ORAL_TABLET | Freq: Every day | ORAL | Status: DC

## 2020-12-06 MED ORDER — HALOPERIDOL 0.5 MG PO TABS
0.5000 mg | ORAL_TABLET | ORAL | Status: DC | PRN
  Filled 2020-12-06: qty 1

## 2020-12-06 MED ORDER — ONDANSETRON 4 MG PO TBDP
4.0000 mg | ORAL_TABLET | Freq: Four times a day (QID) | ORAL | Status: DC | PRN
  Filled 2020-12-06: qty 1

## 2020-12-06 MED ORDER — POLYVINYL ALCOHOL 1.4 % OP SOLN
1.0000 [drp] | Freq: Four times a day (QID) | OPHTHALMIC | Status: DC | PRN
  Filled 2020-12-06: qty 15

## 2020-12-06 MED ORDER — PANTOPRAZOLE SODIUM 40 MG IV SOLR
40.0000 mg | Freq: Two times a day (BID) | INTRAVENOUS | Status: DC
  Administered 2020-12-06: 40 mg via INTRAVENOUS
  Filled 2020-12-06: qty 40

## 2020-12-06 MED ORDER — ALBUTEROL SULFATE (2.5 MG/3ML) 0.083% IN NEBU: 2.5000 mg | INHALATION_SOLUTION | Freq: Four times a day (QID) | RESPIRATORY_TRACT | Status: DC | PRN

## 2020-12-06 MED ORDER — SODIUM CHLORIDE 0.9 % IV SOLN
1.0000 g | Freq: Once | INTRAVENOUS | Status: AC
  Administered 2020-12-06: 1 g via INTRAVENOUS
  Filled 2020-12-06: qty 10

## 2020-12-06 MED ORDER — MORPHINE SULFATE (PF) 2 MG/ML IV SOLN
2.0000 mg | INTRAVENOUS | Status: DC | PRN
  Administered 2020-12-06: 23:00:00 2 mg via INTRAVENOUS
  Filled 2020-12-06: qty 1

## 2020-12-06 MED ORDER — CERTAVITE/ANTIOXIDANTS PO TABS: 1.0000 | ORAL_TABLET | Freq: Every day | ORAL | Status: DC

## 2020-12-06 MED ORDER — GLYCOPYRROLATE 0.2 MG/ML IJ SOLN
0.2000 mg | INTRAMUSCULAR | Status: DC | PRN
  Filled 2020-12-06: qty 1

## 2020-12-06 MED ORDER — WARFARIN SODIUM 4 MG PO TABS: 8.0000 mg | ORAL_TABLET | Freq: Once | ORAL | Status: DC

## 2020-12-06 MED ORDER — HALOPERIDOL LACTATE 2 MG/ML PO CONC
0.5000 mg | ORAL | Status: DC | PRN
  Filled 2020-12-06: qty 0.3

## 2020-12-06 MED ORDER — LORAZEPAM 2 MG/ML IJ SOLN
1.0000 mg | INTRAMUSCULAR | Status: DC | PRN
  Administered 2020-12-06: 1 mg via INTRAVENOUS
  Filled 2020-12-06: qty 1

## 2020-12-06 MED ORDER — WARFARIN SODIUM 6 MG PO TABS
6.0000 mg | ORAL_TABLET | Freq: Once | ORAL | Status: AC
  Administered 2020-12-06: 6 mg via ORAL
  Filled 2020-12-06: qty 1

## 2020-12-06 MED ORDER — GLYCOPYRROLATE 1 MG PO TABS
1.0000 mg | ORAL_TABLET | ORAL | Status: DC | PRN
  Filled 2020-12-06: qty 1

## 2020-12-06 MED ORDER — IOHEXOL 350 MG/ML SOLN
100.0000 mL | Freq: Once | INTRAVENOUS | Status: AC | PRN
  Administered 2020-12-06: 100 mL via INTRAVENOUS

## 2020-12-06 MED ORDER — AMIODARONE HCL IN DEXTROSE 360-4.14 MG/200ML-% IV SOLN
60.0000 mg/h | INTRAVENOUS | Status: DC
  Administered 2020-12-06 (×2): 60 mg/h via INTRAVENOUS
  Filled 2020-12-06 (×2): qty 200

## 2020-12-06 NOTE — H&P (Signed)
History and Physical    Benjamin Cordova [id number] DOB: [birth year] DOA: 12/17/2020  PCP: Pablo Lawrence, NP  Cardiology:  [facility]   Patient coming from:  Home    Chief Complaint:  sob   HPI: Benjamin Cordova is a 86 y.o. male seen in ed with complaints of shortness of breath.  PIs limited due to difficulty speaking.  Per report patient was at home receiving physical therapy and noticed that his oxygen level was in the low 60s on his 2 L at home.  The home health increased his oxygen to 4 L and his sats increased to 100%.  Patient was brought to the hospital for his hypoxia and was noted to have periods of nonsustained V. tach intermittently.  Pt has past medical history of atrial fibrillation/mural thrombus on Coumadin, congestive heart failure, heart disease, hypertension, hyperlipidemia, TIA, UTI.  ED Course:  Vitals:   12/14/2020 0515 12/05/2020 0530 12/20/2020 0545 12/20/2020 0600  BP: 101/71 119/72 104/61 112/71  Pulse: 98 98 98 96  Resp: (!) 31 (!) 26 (!) 33 (!) 30  Temp:      TempSrc:      SpO2: 98% 97% 98% 98%  Weight:      Height:      In the emergency room patient is alert awake oriented with difficulty speaking, tachypneic with respiratory rates in the high 20s and 30s.  Initial EKG shows sinus rhythm at 90 with PVCs, in the emergency room per ED provider patient had runs of nonsustained V. tach.  Patient was then started on amiodarone, initial troponin of 66, lactic acid of 1.2, urine shows leukocyte Estrace and patient was given Rocephin.  CBC shows glucose of 157, creatinine of 1.08, normal LFTs, BNP of 1508.9, CBC shows a mild white count of 12.8, hemoglobin of 10.7 which is chronic, platelets of 172.  INR 2.1, CT angio imaging negative for PE, positive for constipation, hydronephrosis, right more than left loculated pleural effusion stable.  She was given 40 of Lasix IV.  Review of Systems:  Review of Systems  Constitutional:  Negative for chills and fever.  Respiratory:   Positive for shortness of breath.   Cardiovascular:  Positive for leg swelling. Negative for chest pain and palpitations.  All other systems reviewed and are negative.   Past Medical History:  Diagnosis Date   Arrhythmia    atrial fibrillation   Arthritis    CHF (congestive heart failure) (HCC)    Coronary artery disease    a. s/p CABG in 1981 b. 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 86   Affected right leg   Skin cancer    TIA (transient ischemic attack) 1989   Chronic coumadin   UTI (lower urinary tract infection)     Past Surgical History:  Procedure Laterality Date   CARDIAC CATHETERIZATION N/A 10/05/2014   Procedure: Left Heart Cath and Coronary Angiography;  Surgeon: Peter M Martinique, MD;  Location: [place] [facility];  Service: Cardiovascular;  Laterality: N/A;   CORONARY  ARTERY BYPASS GRAFT  1981   [facility]   CORONARY ARTERY BYPASS GRAFT N/A 10/08/2014   Procedure: REDO CORONARY ARTERY BYPASS GRAFTING (CABG) Times Two Grafts using Left Internal Mammary and Left Radial Artery;  Surgeon: Melrose Nakayama, MD;  Location: [place];  Service: Open Heart Surgery;  Laterality: N/A;   CYSTOSCOPY WITH INJECTION  10/10/2010   Procedure: CYSTOSCOPY WITH INJECTION;  Surgeon: Marissa Nestle;  Location: [facility];  Service: Urology;  Laterality: N/A;  with retrograde urethrogram   FOOT SURGERY Right 1954   [facility], muscle implantation   JOINT REPLACEMENT     RADIAL ARTERY HARVEST Left 10/08/2014   Procedure: Left RADIAL ARTERY HARVEST;  Surgeon: Melrose Nakayama, MD;  Location: [place];  Service: Open Heart Surgery;  Laterality: Left;   TEE WITHOUT CARDIOVERSION N/A 10/08/2014   Procedure: TRANSESOPHAGEAL ECHOCARDIOGRAM (TEE);  Surgeon: Melrose Nakayama, MD;  Location: [place];  Service: Open Heart Surgery;  Laterality: N/A;   TONSILLECTOMY  1949   [facility]   TOTAL KNEE ARTHROPLASTY Left 07/27/2015   Procedure: TOTAL LEFT KNEE ARTHROPLASTY;  Surgeon: [name], MD;  Location: [place];  Service: Orthopedics;  Laterality: Left;     reports that he quit smoking about 42 years ago. His smoking use included cigarettes. He has a 28.00 pack-year smoking history. He has never used smokeless tobacco. He reports that he does not drink alcohol and does not use drugs.  No Known Allergies  Family History  Problem Relation Age of Onset   Heart attack Mother    Diabetes Mother    Heart attack Father    Heart attack Brother    Anesthesia problems Neg Hx    Hypotension Neg Hx    Malignant hyperthermia Neg Hx    Pseudochol deficiency Neg Hx     Prior to Admission medications   Medication Sig Start Date End Date Taking? Authorizing Provider  acetaminophen (TYLENOL) 325 MG tablet Take 2 tablets (650 mg total) by mouth every 6 (six) hours. 10/11/20   Johnson, Clanford L, MD  albuterol (PROVENTIL) (2.5 MG/3ML) 0.083% nebulizer solution Take 2.5 mg by nebulization every 6 (six) hours as needed for wheezing or shortness of breath.  08/29/19   [provider]  ascorbic acid (VITAMIN C) 500 MG tablet Take 1,000 mg by mouth 2 (two) times daily.    [provider]  atorvastatin (LIPITOR) 80 MG tablet Take 1 tablet (80 mg total) by mouth at bedtime. 11/14/20   Terrilee Croak, MD  diphenhydrAMINE (BENADRYL) 50 MG capsule Take 1 capsule (50 mg total) by mouth at bedtime as needed for sleep. 10/11/20   Johnson, Clanford L, MD  finasteride (PROSCAR) 5 MG tablet Take 5 mg by mouth daily. 04/20/19   [provider]  folic acid (FOLVITE) [id number] MCG tablet Take 800 mcg by mouth  daily.    [provider]  guaiFENesin (MUCINEX) 600 MG 12 hr tablet Take 1 tablet (600 mg total) by mouth 2 (two) times daily. 11/14/20   Dahal, Marlowe Aschoff, MD  ipratropium-albuterol (DUONEB) 0.5-2.5 (3) MG/3ML SOLN Take 3 mLs by nebulization every 4 (four) hours as needed (SOB/wheezing).    [provider]  metoprolol succinate (TOPROL-XL) 50 MG 24 hr tablet Take 1 tablet (50 mg total) by mouth daily. Take with or immediately following a meal. 10/29/20   Tat, Shanon Brow, MD  Multiple Vitamins-Minerals (CERTAVITE/ANTIOXIDANTS) TABS Take 1 tablet by mouth daily.     [provider]  polyethylene  glycol (MIRALAX / GLYCOLAX) 17 g packet Take 17 g by mouth daily as needed for moderate constipation. 11/14/20   Terrilee Croak, MD  potassium chloride (K-DUR) 10 MEQ tablet Take 10 mEq by mouth daily. Take with Lasix (Furosemide) 03/18/18   [provider]  senna-docusate (SENOKOT-S) 8.6-50 MG tablet Take 1 tablet by mouth at bedtime. 10/11/20   Johnson, Clanford L, MD  spironolactone (ALDACTONE) 25 MG tablet Take 0.5 tablets (12.5 mg total) by mouth daily. 11/14/20 02/12/21  Terrilee Croak, MD  tamsulosin (FLOMAX) 0.4 MG CAPS capsule Take 0.4 mg by mouth in the morning.  12/02/18   [provider]  torsemide 40 MG TABS Take 40 mg by mouth 2 (two) times daily. 11/14/20   Terrilee Croak, MD  warfarin (COUMADIN) 4 MG tablet Take 2 tablets (8 mg total) by mouth one time only at 4 PM. 11/14/20   Terrilee Croak, MD    Physical Exam: Vitals:   12/08/2020 0515 12/18/2020 0530 12/18/2020 0545 12/22/2020 0600  BP: 101/71 119/72 104/61 112/71  Pulse: 98 98 98 96  Resp: (!) 31 (!) 26 (!) 33 (!) 30  Temp:      TempSrc:      SpO2: 98% 97% 98% 98%  Weight:      Height:       Physical Exam Vitals and nursing note reviewed.  Constitutional:      General: He is not in acute distress.    Appearance: He is ill-appearing.  HENT:     Head: Normocephalic and atraumatic.     Right Ear: External ear  normal.     Left Ear: External ear normal.     Nose: Nose normal.     Mouth/Throat:     Mouth: Mucous membranes are moist.  Eyes:     Extraocular Movements: Extraocular movements intact.     Pupils: Pupils are equal, round, and reactive to light.  Neck:     Vascular: No carotid bruit.  Cardiovascular:     Rate and Rhythm: Normal rate and regular rhythm.     Pulses: Normal pulses.     Heart sounds: Normal heart sounds.  Pulmonary:     Effort: Pulmonary effort is normal.     Breath sounds: Rales present.  Abdominal:     General: Bowel sounds are normal. There is no distension.     Palpations: Abdomen is soft. There is no mass.     Tenderness: There is no abdominal tenderness. There is no guarding.     Hernia: No hernia is present.  Musculoskeletal:     Right lower leg: No edema.     Left lower leg: No edema.  Skin:    General: Skin is warm.  Neurological:     General: No focal deficit present.     Mental Status: He is alert. He is disoriented.     Cranial Nerves: Dysarthria present. No cranial nerve deficit or facial asymmetry.     Sensory: No sensory deficit.     Motor: Weakness present.     Labs on Admission: I have personally reviewed following labs and imaging studies  No results for input(s): CKTOTAL, CKMB, TROPONINI in the last 72 hours. Lab Results  Component Value Date   WBC 12.8 (H) 12/08/2020   HGB 10.7 (L) 12/28/2020   HCT 34.4 (L) 12/13/2020   MCV 98.0 12/12/2020   PLT 172 12/11/2020    Recent Labs  Lab 12/23/2020 0201  NA 138  K 4.4  CL 86*  CO2 43*  BUN 45*  CREATININE 1.08  CALCIUM 8.9  PROT 6.8  BILITOT 0.8  ALKPHOS 54  ALT 18  AST 19  GLUCOSE 157*   Lab Results  Component Value Date   CHOL 118 (L) 03/05/2015   HDL 39 (L) 03/05/2015   LDLCALC 58 03/05/2015   TRIG 104 03/05/2015   No results found for: DDIMER Invalid input(s): POCBNP  Urinalysis    Component Value Date/Time   COLORURINE YELLOW 12/24/2020 0233   APPEARANCEUR CLEAR  12/24/2020 0233   LABSPEC 1.015 12/12/2020 0233   PHURINE >9.0 (H) 12/04/2020 0233   GLUCOSEU NEGATIVE 12/13/2020 0233   HGBUR TRACE (A) 12/09/2020 0233   BILIRUBINUR NEGATIVE 12/21/2020 0233   KETONESUR NEGATIVE 12/26/2020 0233   PROTEINUR 100 (A) 12/26/2020 0233   UROBILINOGEN 1.0 10/07/2014 1638   NITRITE NEGATIVE 12/05/2020 0233   LEUKOCYTESUR MODERATE (A) 12/02/2020 0233   COVID-19 Labs No results for input(s): DDIMER, FERRITIN, LDH, CRP in the last 72 hours.  Lab Results  Component Value Date   SARSCOV2NAA NEGATIVE 12/27/2020   SARSCOV2NAA NEGATIVE 11/14/2020   SARSCOV2NAA NEGATIVE 11/08/2020   [place] NEGATIVE 10/26/2020    Radiological Exams on Admission: CT HEAD WO CONTRAST (5MM)  Result Date: 12/15/2020 CLINICAL DATA:  86 year old male with acute respiratory distress, V-tach. Abdominal distension. EXAM: CT HEAD WITHOUT CONTRAST TECHNIQUE: Contiguous axial images were obtained from the base of the skull through the vertex without intravenous contrast. COMPARISON:  Head CT 11/08/2020 and earlier. FINDINGS: Brain: Chronic right MCA infarct with encephalomalacia. Stable cerebral volume. Stable mild ex vacuo enlargement of the right lateral ventricle. No midline shift, ventriculomegaly, mass effect, evidence of mass lesion, intracranial hemorrhage or evidence of cortically based acute infarction. Stable gray-white matter differentiation throughout the brain. Vascular: Calcified atherosclerosis at the skull base. No suspicious intracranial vascular hyperdensity. Skull: No acute osseous abnormality identified. Hyperostosis frontalis, normal variant. Sinuses/Orbits: Visualized paranasal sinuses and mastoids are stable and generally well aerated. Chronic left maxillary mucous retention cysts. Mild right mastoid effusion. Other: No acute orbit or scalp soft tissue finding. IMPRESSION: 1. No acute intracranial abnormality. 2. Chronic Right MCA infarct. Electronically Signed   By: Genevie Ann  M.D.   On: 12/27/2020 05:03   CT Angio Chest PE W and/or Wo Contrast  Result Date: 12/03/2020 CLINICAL DATA:  86 year old male with acute respiratory distress, V-tach. Abdominal distension. EXAM: CT ANGIOGRAPHY CHEST CT ABDOMEN AND PELVIS WITH CONTRAST TECHNIQUE: Multidetector CT imaging of the chest was performed using the standard protocol during bolus administration of intravenous contrast. Multiplanar CT image reconstructions and MIPs were obtained to evaluate the vascular anatomy. Multidetector CT imaging of the abdomen and pelvis was performed using the standard protocol during bolus administration of intravenous contrast. CONTRAST:  14m OMNIPAQUE IOHEXOL 350 MG/ML SOLN COMPARISON:  CTA chest 11/08/2020. CT Abdomen and Pelvis 09/13/2010. FINDINGS: CTA CHEST FINDINGS Cardiovascular: Good contrast bolus timing in the pulmonary arterial tree. Respiratory motion. No central or saddle embolus. Heterogeneous appearance of left upper lobe pulmonary arteries on series 5, image 83 is felt more likely to be motion artifact than small peripheral PE, and the distal branches there appear to remain patent. Similar degraded detail of other bilateral distal pulmonary arteries. No convincing pulmonary embolus. Prior CABG. Stable cardiomegaly. No pericardial effusion. Calcified aortic atherosclerosis. Little contrast in the aorta today. Mediastinum/Nodes: Stable.  No mediastinal lymphadenopathy. Lungs/Pleura: Partially layering and loculated bilateral pleural effusions are stable since last month, moderate on the right and smaller on the left.  Some chronic left pleural thickening is redemonstrated. Respiratory motion artifact. Atelectatic changes to the major airways which remain patent. Trace retained secretions in the dependent trachea near the thoracic inlet. Ongoing lower lobe compressive atelectasis suspected, with some enhancement of the compressed right lower lobe. Overall ventilation has not significantly changed  since last month. Musculoskeletal: Prior sternotomy. Chronic right lateral 6th through 8th rib fractures. Chronic right posterior 7th through 10th rib fractures also appear stable. No new osseous abnormality. Review of the MIP images confirms the above findings. CT ABDOMEN and PELVIS FINDINGS Hepatobiliary: Chronic surgical clips along the left hepatic lobe are stable since 2012. Partially contracted gallbladder. No definite pericholecystic inflammation. Liver enhancement is within normal limits. No bile duct enlargement. Pancreas: Negative. Spleen: Negative. Adrenals/Urinary Tract: Stable adrenal glands, small right adrenal lateral limb adenoma is unchanged since 2012. Multiple bilateral renal cysts. The right kidney appears nonobstructed with diminutive renal pelvis and decompressed ureter to the bladder. On the left there is mild left hydronephrosis and asymmetric left hydroureter which drastically tapers as the ureter crosses into the pelvis. See series 2, images 61 through 80. The distal ureter then is decompressed. No ureteral calculus or obstructing etiology identified. Absence of renal contrast excretion on the delayed images is symmetric. The bladder is decompressed by Foley catheter but thick-walled. The bladder wall appears indistinct. Stomach/Bowel: Large stool ball in the rectosigmoid colon. See sagittal image 77. Mild presacral stranding is associated. Redundant proximal sigmoid colon. Intermittent retained stool throughout the large bowel upstream of the sigmoid. No large bowel wall thickening. Normal appendix suspected on series 2, image 67. Decompressed terminal ileum. No dilated small bowel. Decompressed stomach and duodenum. No free air. No free fluid. Vascular/Lymphatic: Aortoiliac calcified atherosclerosis. Major arterial structures remain patent. Portal venous system appears patent. No lymphadenopathy. Reproductive: Urethral catheter in place, otherwise negative. Other: No pelvic free fluid.  Musculoskeletal: Progressed lumbar spine degeneration since 2012. No acute osseous abnormality identified. Review of the MIP images confirms the above findings. IMPRESSION: 1. Motion degraded Chest CTA with no convincing pulmonary embolus. 2. Stable ventilation. Right > left partially loculated pleural effusions are stable since last month, moderate on the right. Stable associated compressive atelectasis. 3. Large stool ball in the rectosigmoid colon with mild presacral stranding suggests Fecal Impaction. No other bowel inflammation. 4. Mild to moderate left hydronephrosis and left hydroureter with abrupt tapering of the left ureter at the pelvic inlet is suspicious for a Ureteral Stricture. No obstructing calculus identified. Also, absence contrast excretion on the delayed images raises the possibility of bilateral renal insufficiency. 5. Bladder decompressed by Foley catheter. Superimposed bladder wall thickening and indistinctness raises the possibility of UTI/Cystitis. 6. Stable cardiomegaly. Prior CABG. Aortic Atherosclerosis (ICD10-I70.0). Electronically Signed   By: Genevie Ann M.D.   On: 12/21/2020 05:18   CT ABDOMEN PELVIS W CONTRAST  Result Date: 12/01/2020 CLINICAL DATA:  86 year old male with acute respiratory distress, V-tach. Abdominal distension. EXAM: CT ANGIOGRAPHY CHEST CT ABDOMEN AND PELVIS WITH CONTRAST TECHNIQUE: Multidetector CT imaging of the chest was performed using the standard protocol during bolus administration of intravenous contrast. Multiplanar CT image reconstructions and MIPs were obtained to evaluate the vascular anatomy. Multidetector CT imaging of the abdomen and pelvis was performed using the standard protocol during bolus administration of intravenous contrast. CONTRAST:  164m OMNIPAQUE IOHEXOL 350 MG/ML SOLN COMPARISON:  CTA chest 11/08/2020. CT Abdomen and Pelvis 09/13/2010. FINDINGS: CTA CHEST FINDINGS Cardiovascular: Good contrast bolus timing in the pulmonary arterial  tree. Respiratory motion. No  central or saddle embolus. Heterogeneous appearance of left upper lobe pulmonary arteries on series 5, image 83 is felt more likely to be motion artifact than small peripheral PE, and the distal branches there appear to remain patent. Similar degraded detail of other bilateral distal pulmonary arteries. No convincing pulmonary embolus. Prior CABG. Stable cardiomegaly. No pericardial effusion. Calcified aortic atherosclerosis. Little contrast in the aorta today. Mediastinum/Nodes: Stable.  No mediastinal lymphadenopathy. Lungs/Pleura: Partially layering and loculated bilateral pleural effusions are stable since last month, moderate on the right and smaller on the left. Some chronic left pleural thickening is redemonstrated. Respiratory motion artifact. Atelectatic changes to the major airways which remain patent. Trace retained secretions in the dependent trachea near the thoracic inlet. Ongoing lower lobe compressive atelectasis suspected, with some enhancement of the compressed right lower lobe. Overall ventilation has not significantly changed since last month. Musculoskeletal: Prior sternotomy. Chronic right lateral 6th through 8th rib fractures. Chronic right posterior 7th through 10th rib fractures also appear stable. No new osseous abnormality. Review of the MIP images confirms the above findings. CT ABDOMEN and PELVIS FINDINGS Hepatobiliary: Chronic surgical clips along the left hepatic lobe are stable since 2012. Partially contracted gallbladder. No definite pericholecystic inflammation. Liver enhancement is within normal limits. No bile duct enlargement. Pancreas: Negative. Spleen: Negative. Adrenals/Urinary Tract: Stable adrenal glands, small right adrenal lateral limb adenoma is unchanged since 2012. Multiple bilateral renal cysts. The right kidney appears nonobstructed with diminutive renal pelvis and decompressed ureter to the bladder. On the left there is mild left  hydronephrosis and asymmetric left hydroureter which drastically tapers as the ureter crosses into the pelvis. See series 2, images 61 through 80. The distal ureter then is decompressed. No ureteral calculus or obstructing etiology identified. Absence of renal contrast excretion on the delayed images is symmetric. The bladder is decompressed by Foley catheter but thick-walled. The bladder wall appears indistinct. Stomach/Bowel: Large stool ball in the rectosigmoid colon. See sagittal image 77. Mild presacral stranding is associated. Redundant proximal sigmoid colon. Intermittent retained stool throughout the large bowel upstream of the sigmoid. No large bowel wall thickening. Normal appendix suspected on series 2, image 67. Decompressed terminal ileum. No dilated small bowel. Decompressed stomach and duodenum. No free air. No free fluid. Vascular/Lymphatic: Aortoiliac calcified atherosclerosis. Major arterial structures remain patent. Portal venous system appears patent. No lymphadenopathy. Reproductive: Urethral catheter in place, otherwise negative. Other: No pelvic free fluid. Musculoskeletal: Progressed lumbar spine degeneration since 2012. No acute osseous abnormality identified. Review of the MIP images confirms the above findings. IMPRESSION: 1. Motion degraded Chest CTA with no convincing pulmonary embolus. 2. Stable ventilation. Right > left partially loculated pleural effusions are stable since last month, moderate on the right. Stable associated compressive atelectasis. 3. Large stool ball in the rectosigmoid colon with mild presacral stranding suggests Fecal Impaction. No other bowel inflammation. 4. Mild to moderate left hydronephrosis and left hydroureter with abrupt tapering of the left ureter at the pelvic inlet is suspicious for a Ureteral Stricture. No obstructing calculus identified. Also, absence contrast excretion on the delayed images raises the possibility of bilateral renal insufficiency. 5.  Bladder decompressed by Foley catheter. Superimposed bladder wall thickening and indistinctness raises the possibility of UTI/Cystitis. 6. Stable cardiomegaly. Prior CABG. Aortic Atherosclerosis (ICD10-I70.0). Electronically Signed   By: Genevie Ann M.D.   On: 12/22/2020 05:18   DG Chest Portable 1 View  Result Date: 12/27/2020 CLINICAL DATA:  Dyspnea EXAM: PORTABLE CHEST 1 VIEW COMPARISON:  11/13/2020 FINDINGS: Lung volumes  are small and pulmonary insufflation has diminished when compared to prior examination. Left lateral pleural thickening or loculated pleural effusion is unchanged. Small bilateral pleural effusions are present. No superimposed focal pulmonary infiltrate. No pneumothorax. Coronary artery bypass grafting has been performed. Mild cardiomegaly is stable. Pulmonary vascularity is normal. IMPRESSION: Progressive pulmonary hypoinflation. Stable laterally loculated left pleural effusion versus pleural thickening. Stable small bilateral pleural effusions. Stable cardiomegaly. Electronically Signed   By: Fidela Salisbury M.D.   On: 12/28/2020 02:27    EKG: Independently reviewed.  Sinus rhythm with PVCs.  Echocardiogram 10/07/2020 : 1. LVEF is severely depressed with severe hypokineis of the mid/distal ventricle; distal anterior, dsital inferior and apical akinesis. Note apex is not well seen in this bedside study COmpared to 2021 no significant change . Left ventricular ejection fraction, by estimation, is 20%%. The left ventricular internal cavity size was severely dilated. Left ventricular diastolic parameters are indeterminate. 2. Right ventricular systolic function is normal. The right ventricular size is normal. There is normal pulmonary artery systolic pressure. 3. Trivial mitral valve regurgitation. 4. The aortic valve is tricuspid. Aortic valve regurgitation is not visualized. Mild aortic valve sclerosis is present, with no evidence of aortic valve  stenosis.   Assessment/Plan Principal Problem:   Acute on chronic respiratory failure with hypoxia (HCC) Active Problems:   Systolic and diastolic CHF, acute on chronic (HCC)   Coronary artery disease   Hypertension   Paroxysmal atrial fibrillation (HCC)   Anemia   LV (left ventricular) mural thrombus Acute on chronic respiratory failure with hypoxia/congestive heart failure: Will admit patient to stepdown unit with continuous cardiac monitoring. Attribute to acute on chronic congestive heart failure.  Patient's received 40 of Lasix IV, will continue IV Lasix for 1 more dose, a.m. team to transition to p.o.Daily weights and strict I's and O's.  CAD: We will continue patient on his atorvastatin 80, Toprol-XL 50, Aldactone 12.5, torsemide held and will start Lasix 40 mg IV every 12 for 1 more dose.  Hypertension: We will continue patient Aldactone and metoprolol.  Paroxysmal atrial fibrillation: Warfarin continued per pharmacy protocol.  Anemia: Patient has been anemic chronically since 2016 July, suspect anemia of chronic disease.  Will obtain an anemia panel, IV PPI, stool guaiac.  Left ventricular mural thrombus: We will continue patient on Coumadin.  UTI: We will continue rocephin. F/u on c/s.   DVT prophylaxis:  Warfarin  Code Status:  DNR  Family Communication:  Teagun, Tivnan (Son)  [phone] ([place])   Disposition Plan:  To be determined  Consults called:  None  Admission status: Inpatient   Para Skeans MD [facility] [phone] How to contact the [facility] Attending or Consulting provider [place] or covering provider during after hours [place], for this patient.    Check the care team in [facility] and look for a) attending/consulting [facility] provider listed and b) the [facility] team listed Log into [email or web address] and use [place]'s universal password to access. If you do not have the password, please contact the hospital operator. Locate the [facility]  provider you are looking for under [facility] and page to a number that you can be directly reached. If you still have difficulty reaching the provider, please page the [facility] (Director on Call) for the Hospitalists listed on amion for assistance. [email or web address] Password [facility] 12/03/2020, 6:17 AM

## 2020-12-06 NOTE — Progress Notes (Addendum)
[facility] [place] [facility] [place] NAME: Benjamin Cordova    MR#:  [id number]  DATE OF BIRTH:  06-Nov-1934  SUBJECTIVE:  no family at bedside. Discussed with son Benjamin Cordova on the phone. Patient apparently came from [place] with hypoxic respiratory failure sat 60% on 2 L along with tachycardia and runs of nonsustainable VT.  He is currently on IV amiodarone drip heart rate is in the 90s.  Patient is hard on hearing and difficult to understand when he talks. Denies any chest pain no other issues per RN.  REVIEW OF SYSTEMS:   Review of Systems  Unable to perform ROS: Mental acuity  Tolerating Diet: Tolerating PT:   DRUG ALLERGIES:  No Known Allergies  VITALS:  Blood pressure 108/65, pulse 88, temperature 98.2 F (36.8 C), resp. rate (!) 24, height '5\' 8"'$  (1.727 m), weight 98.6 kg, SpO2 94 %.  PHYSICAL EXAMINATION:   Physical Exam  GENERAL:  86 y.o.-year-old patient lying in the bed with no acute distress.  LUNGS: decreased breath sounds bilaterally, no wheezing, rales, rhonchi. No use of accessory muscles of respiration.  CARDIOVASCULAR: S1, S2 normal. No murmurs, rubs, or gallops.  ABDOMEN: Soft, nontender, nondistended. Bowel sounds present. No organomegaly or mass.  EXTREMITIES: + chronic edema b/l.    NEUROLOGIC: moves all extremities well. Grossly non-focal PSYCHIATRIC:  patient is alert and awake SKIN: No obvious rash, lesion, or ulcer.   LABORATORY PANEL:  CBC Recent Labs  Lab 12/25/2020 0201  WBC 12.8*  HGB 10.7*  HCT 34.4*  PLT 172    Chemistries  Recent Labs  Lab 12/16/2020 0201  NA 138  K 4.4  CL 86*  CO2 43*  GLUCOSE 157*  BUN 45*  CREATININE 1.08  CALCIUM 8.9  MG 2.4  AST 19  ALT 18  ALKPHOS 54  BILITOT 0.8   Cardiac Enzymes No results for input(s): TROPONINI in the last 168 hours. RADIOLOGY:  CT HEAD WO CONTRAST (5MM)  Result Date: 12/04/2020 CLINICAL DATA:  86 year old male with acute  respiratory distress, V-tach. Abdominal distension. EXAM: CT HEAD WITHOUT CONTRAST TECHNIQUE: Contiguous axial images were obtained from the base of the skull through the vertex without intravenous contrast. COMPARISON:  Head CT 11/08/2020 and earlier. FINDINGS: Brain: Chronic right MCA infarct with encephalomalacia. Stable cerebral volume. Stable mild ex vacuo enlargement of the right lateral ventricle. No midline shift, ventriculomegaly, mass effect, evidence of mass lesion, intracranial hemorrhage or evidence of cortically based acute infarction. Stable gray-white matter differentiation throughout the brain. Vascular: Calcified atherosclerosis at the skull base. No suspicious intracranial vascular hyperdensity. Skull: No acute osseous abnormality identified. Hyperostosis frontalis, normal variant. Sinuses/Orbits: Visualized paranasal sinuses and mastoids are stable and generally well aerated. Chronic left maxillary mucous retention cysts. Mild right mastoid effusion. Other: No acute orbit or scalp soft tissue finding. IMPRESSION: 1. No acute intracranial abnormality. 2. Chronic Right MCA infarct. Electronically Signed   By: Genevie Ann M.D.   On: 12/19/2020 05:03   CT Angio Chest PE W and/or Wo Contrast  Result Date: 12/03/2020 CLINICAL DATA:  86 year old male with acute respiratory distress, V-tach. Abdominal distension. EXAM: CT ANGIOGRAPHY CHEST CT ABDOMEN AND PELVIS WITH CONTRAST TECHNIQUE: Multidetector CT imaging of the chest was performed using the standard protocol during bolus administration of intravenous contrast. Multiplanar CT image reconstructions and MIPs were obtained to evaluate the vascular anatomy. Multidetector CT imaging of the abdomen and pelvis was performed using the standard protocol during bolus  administration of intravenous contrast. CONTRAST:  174m OMNIPAQUE IOHEXOL 350 MG/ML SOLN COMPARISON:  CTA chest 11/08/2020. CT Abdomen and Pelvis 09/13/2010. FINDINGS: CTA CHEST FINDINGS  Cardiovascular: Good contrast bolus timing in the pulmonary arterial tree. Respiratory motion. No central or saddle embolus. Heterogeneous appearance of left upper lobe pulmonary arteries on series 5, image 83 is felt more likely to be motion artifact than small peripheral PE, and the distal branches there appear to remain patent. Similar degraded detail of other bilateral distal pulmonary arteries. No convincing pulmonary embolus. Prior CABG. Stable cardiomegaly. No pericardial effusion. Calcified aortic atherosclerosis. Little contrast in the aorta today. Mediastinum/Nodes: Stable.  No mediastinal lymphadenopathy. Lungs/Pleura: Partially layering and loculated bilateral pleural effusions are stable since last month, moderate on the right and smaller on the left. Some chronic left pleural thickening is redemonstrated. Respiratory motion artifact. Atelectatic changes to the major airways which remain patent. Trace retained secretions in the dependent trachea near the thoracic inlet. Ongoing lower lobe compressive atelectasis suspected, with some enhancement of the compressed right lower lobe. Overall ventilation has not significantly changed since last month. Musculoskeletal: Prior sternotomy. Chronic right lateral 6th through 8th rib fractures. Chronic right posterior 7th through 10th rib fractures also appear stable. No new osseous abnormality. Review of the MIP images confirms the above findings. CT ABDOMEN and PELVIS FINDINGS Hepatobiliary: Chronic surgical clips along the left hepatic lobe are stable since 2012. Partially contracted gallbladder. No definite pericholecystic inflammation. Liver enhancement is within normal limits. No bile duct enlargement. Pancreas: Negative. Spleen: Negative. Adrenals/Urinary Tract: Stable adrenal glands, small right adrenal lateral limb adenoma is unchanged since 2012. Multiple bilateral renal cysts. The right kidney appears nonobstructed with diminutive renal pelvis and  decompressed ureter to the bladder. On the left there is mild left hydronephrosis and asymmetric left hydroureter which drastically tapers as the ureter crosses into the pelvis. See series 2, images 61 through 80. The distal ureter then is decompressed. No ureteral calculus or obstructing etiology identified. Absence of renal contrast excretion on the delayed images is symmetric. The bladder is decompressed by Foley catheter but thick-walled. The bladder wall appears indistinct. Stomach/Bowel: Large stool ball in the rectosigmoid colon. See sagittal image 77. Mild presacral stranding is associated. Redundant proximal sigmoid colon. Intermittent retained stool throughout the large bowel upstream of the sigmoid. No large bowel wall thickening. Normal appendix suspected on series 2, image 67. Decompressed terminal ileum. No dilated small bowel. Decompressed stomach and duodenum. No free air. No free fluid. Vascular/Lymphatic: Aortoiliac calcified atherosclerosis. Major arterial structures remain patent. Portal venous system appears patent. No lymphadenopathy. Reproductive: Urethral catheter in place, otherwise negative. Other: No pelvic free fluid. Musculoskeletal: Progressed lumbar spine degeneration since 2012. No acute osseous abnormality identified. Review of the MIP images confirms the above findings. IMPRESSION: 1. Motion degraded Chest CTA with no convincing pulmonary embolus. 2. Stable ventilation. Right > left partially loculated pleural effusions are stable since last month, moderate on the right. Stable associated compressive atelectasis. 3. Large stool ball in the rectosigmoid colon with mild presacral stranding suggests Fecal Impaction. No other bowel inflammation. 4. Mild to moderate left hydronephrosis and left hydroureter with abrupt tapering of the left ureter at the pelvic inlet is suspicious for a Ureteral Stricture. No obstructing calculus identified. Also, absence contrast excretion on the delayed  images raises the possibility of bilateral renal insufficiency. 5. Bladder decompressed by Foley catheter. Superimposed bladder wall thickening and indistinctness raises the possibility of UTI/Cystitis. 6. Stable cardiomegaly. Prior CABG. Aortic Atherosclerosis (ICD10-I70.0).  Electronically Signed   By: Genevie Ann M.D.   On: 12/01/2020 05:18   CT ABDOMEN PELVIS W CONTRAST  Result Date: 12/17/2020 CLINICAL DATA:  86 year old male with acute respiratory distress, V-tach. Abdominal distension. EXAM: CT ANGIOGRAPHY CHEST CT ABDOMEN AND PELVIS WITH CONTRAST TECHNIQUE: Multidetector CT imaging of the chest was performed using the standard protocol during bolus administration of intravenous contrast. Multiplanar CT image reconstructions and MIPs were obtained to evaluate the vascular anatomy. Multidetector CT imaging of the abdomen and pelvis was performed using the standard protocol during bolus administration of intravenous contrast. CONTRAST:  14m OMNIPAQUE IOHEXOL 350 MG/ML SOLN COMPARISON:  CTA chest 11/08/2020. CT Abdomen and Pelvis 09/13/2010. FINDINGS: CTA CHEST FINDINGS Cardiovascular: Good contrast bolus timing in the pulmonary arterial tree. Respiratory motion. No central or saddle embolus. Heterogeneous appearance of left upper lobe pulmonary arteries on series 5, image 83 is felt more likely to be motion artifact than small peripheral PE, and the distal branches there appear to remain patent. Similar degraded detail of other bilateral distal pulmonary arteries. No convincing pulmonary embolus. Prior CABG. Stable cardiomegaly. No pericardial effusion. Calcified aortic atherosclerosis. Little contrast in the aorta today. Mediastinum/Nodes: Stable.  No mediastinal lymphadenopathy. Lungs/Pleura: Partially layering and loculated bilateral pleural effusions are stable since last month, moderate on the right and smaller on the left. Some chronic left pleural thickening is redemonstrated. Respiratory motion  artifact. Atelectatic changes to the major airways which remain patent. Trace retained secretions in the dependent trachea near the thoracic inlet. Ongoing lower lobe compressive atelectasis suspected, with some enhancement of the compressed right lower lobe. Overall ventilation has not significantly changed since last month. Musculoskeletal: Prior sternotomy. Chronic right lateral 6th through 8th rib fractures. Chronic right posterior 7th through 10th rib fractures also appear stable. No new osseous abnormality. Review of the MIP images confirms the above findings. CT ABDOMEN and PELVIS FINDINGS Hepatobiliary: Chronic surgical clips along the left hepatic lobe are stable since 2012. Partially contracted gallbladder. No definite pericholecystic inflammation. Liver enhancement is within normal limits. No bile duct enlargement. Pancreas: Negative. Spleen: Negative. Adrenals/Urinary Tract: Stable adrenal glands, small right adrenal lateral limb adenoma is unchanged since 2012. Multiple bilateral renal cysts. The right kidney appears nonobstructed with diminutive renal pelvis and decompressed ureter to the bladder. On the left there is mild left hydronephrosis and asymmetric left hydroureter which drastically tapers as the ureter crosses into the pelvis. See series 2, images 61 through 80. The distal ureter then is decompressed. No ureteral calculus or obstructing etiology identified. Absence of renal contrast excretion on the delayed images is symmetric. The bladder is decompressed by Foley catheter but thick-walled. The bladder wall appears indistinct. Stomach/Bowel: Large stool ball in the rectosigmoid colon. See sagittal image 77. Mild presacral stranding is associated. Redundant proximal sigmoid colon. Intermittent retained stool throughout the large bowel upstream of the sigmoid. No large bowel wall thickening. Normal appendix suspected on series 2, image 67. Decompressed terminal ileum. No dilated small bowel.  Decompressed stomach and duodenum. No free air. No free fluid. Vascular/Lymphatic: Aortoiliac calcified atherosclerosis. Major arterial structures remain patent. Portal venous system appears patent. No lymphadenopathy. Reproductive: Urethral catheter in place, otherwise negative. Other: No pelvic free fluid. Musculoskeletal: Progressed lumbar spine degeneration since 2012. No acute osseous abnormality identified. Review of the MIP images confirms the above findings. IMPRESSION: 1. Motion degraded Chest CTA with no convincing pulmonary embolus. 2. Stable ventilation. Right > left partially loculated pleural effusions are stable since last month, moderate on the  right. Stable associated compressive atelectasis. 3. Large stool ball in the rectosigmoid colon with mild presacral stranding suggests Fecal Impaction. No other bowel inflammation. 4. Mild to moderate left hydronephrosis and left hydroureter with abrupt tapering of the left ureter at the pelvic inlet is suspicious for a Ureteral Stricture. No obstructing calculus identified. Also, absence contrast excretion on the delayed images raises the possibility of bilateral renal insufficiency. 5. Bladder decompressed by Foley catheter. Superimposed bladder wall thickening and indistinctness raises the possibility of UTI/Cystitis. 6. Stable cardiomegaly. Prior CABG. Aortic Atherosclerosis (ICD10-I70.0). Electronically Signed   By: Genevie Ann M.D.   On: 12/05/2020 05:18   DG Chest Portable 1 View  Result Date: 12/17/2020 CLINICAL DATA:  Dyspnea EXAM: PORTABLE CHEST 1 VIEW COMPARISON:  11/13/2020 FINDINGS: Lung volumes are small and pulmonary insufflation has diminished when compared to prior examination. Left lateral pleural thickening or loculated pleural effusion is unchanged. Small bilateral pleural effusions are present. No superimposed focal pulmonary infiltrate. No pneumothorax. Coronary artery bypass grafting has been performed. Mild cardiomegaly is stable.  Pulmonary vascularity is normal. IMPRESSION: Progressive pulmonary hypoinflation. Stable laterally loculated left pleural effusion versus pleural thickening. Stable small bilateral pleural effusions. Stable cardiomegaly. Electronically Signed   By: Fidela Salisbury M.D.   On: 12/14/2020 02:27   ASSESSMENT AND PLAN:  Benjamin Cordova is a 86 y.o. male with PMH significant for HTN, stroke, PAD, CAD/CABG, ischemic cardiomyopathy, paroxysmal A. fib, history of LV mural thrombus. Patient was brought to ED from [facility] rehab on 9/5 for evaluation of tachycardia and hypoxia will sats 60% on 2 L nasal cannula.  Acute on chronic hypoxic respiratory failure Acute on chronic systolic congestive heart failure with severe Ischemic cardiomyopathy/known EF of 20% -- patient was brought from the nursing facility after he was noted to have low sats on 2 L nasal cannula. -- Patient does have chronic left pleural effusion versus pleural thickening -- sats are hundred percent on 4 L. Patient currently is off BiPAP -- IV lasix, BB, spironolactone, statins -- monitor input output -- cardiology consultation with [facility] cardiology. -- Elevated BNP -- patient was on empiric Rocephin ?Marland Kitchen Discontinued for now. No source of infection identified -- blood culture negative -- CT chest negative for PE  Paroxysmal atrial fibrillation nonsustainable VT in the setting of hypoxia LV thrombus -- continue IV amiodarone drip -- await cardiology consultation -- warfarin per pharmacy  elevated troponin in the setting of hypoxia and CHF appears demand ischemia history of CAD/CABG -- no chest pain -- 66---66  BPH with chronic Foley catheter -- continue finasteride -- patient will need to follow-up with urology as outpatient  hyperlipidemia on statins   Procedures: Family communication : son Benjamin Cordova on the phone Consults : cardiology [facility] CODE STATUS: DNR-- care is out of facility yellow form DVT Prophylaxis  : warfarin Level of care: Progressive Cardiac Status is: Inpatient  Remains inpatient appropriate because:Inpatient level of care appropriate due to severity of illness  Dispo: The patient is from: SNF              Anticipated d/c is to: SNF              Patient currently is not medically stable to d/c.   Difficult to place patient No        TOTAL TIME TAKING CARE OF THIS PATIENT: 25 minutes.  >50% time spent on counselling and coordination of care  Note: This dictation was prepared with Dragon dictation  along with smaller phrase technology. Any transcriptional errors that result from this process are unintentional.  Fritzi Mandes M.D    [facility]   CC: Primary care physician; Pablo Lawrence, NP Patient ID: [name], male   DOB: Feb 22, 1935, 86 y.o.   MRN: [id number]

## 2020-12-06 NOTE — ED Triage Notes (Signed)
Pt came from [facility]; pt started physical therapy today; pt went into respiratory distress earleir went into the 60's% on 2L O2; pt is now on 4L at 100%; pt has a foley catheter in place; pt also goes into periods of Vtach and jumps out of it;

## 2020-12-06 NOTE — Progress Notes (Signed)
ANTICOAGULATION CONSULT NOTE - Initial Consult  Pharmacy Consult for warfarin Indication:  atrial fibrillation/history of LV mural thrombus  No Known Allergies  Patient Measurements: Height: '5\' 8"'$  (172.7 cm) Weight: 98.6 kg (217 lb 6 oz) IBW/kg (Calculated) : 68.4  Vital Signs: Temp: 98.6 F (37 C) (09/06 0154) Temp Source: Axillary (09/06 0154) BP: 109/59 (09/06 1000) Pulse Rate: 94 (09/06 0900)  Labs: Recent Labs    12/03/2020 0201 12/30/2020 0424  HGB 10.7*  --   HCT 34.4*  --   PLT 172  --   LABPROT 23.3*  --   INR 2.1*  --   CREATININE 1.08  --   TROPONINIHS 66* 66*    Estimated Creatinine Clearance: 59 mL/min (by C-G formula based on SCr of 1.08 mg/dL).   Medical History: Past Medical History:  Diagnosis Date   Arrhythmia    atrial fibrillation   Arthritis    CHF (congestive heart failure) (HCC)    Coronary artery disease    a. s/p CABG in 1981 b. 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 86   Affected right leg   Skin cancer    TIA (transient ischemic attack) 1989   Chronic coumadin   UTI (lower urinary tract infection)     Medications:  (Not in a hospital admission)  Scheduled:   ascorbic acid  1,000 mg Oral BID   atorvastatin  80 mg Oral QHS   finasteride  5 mg Oral Daily   furosemide  40 mg Intravenous Once   metoprolol succinate  50 mg Oral Daily   multivitamin with minerals  1 tablet Oral Daily   pantoprazole (PROTONIX) IV  40 mg Intravenous Q12H   sodium chloride flush  3 mL Intravenous Q12H   sodium chloride flush  3 mL Intravenous Q12H    spironolactone  12.5 mg Oral Daily   tamsulosin  0.4 mg Oral q AM   Warfarin - Pharmacist Dosing Inpatient   Does not apply q1600   Infusions:   sodium chloride     amiodarone 30 mg/hr (12/25/2020 0847)   [START ON 12/16/2020] cefTRIAXone (ROCEPHIN)  IV     PRN: sodium chloride, acetaminophen **OR** acetaminophen, albuterol, hydrALAZINE, ipratropium-albuterol, sodium chloride flush Anti-infectives (From admission, onward)    Start     Dose/Rate Route Frequency Ordered Stop   2020-12-16 0600  cefTRIAXone (ROCEPHIN) 1 g in sodium chloride 0.9 % 100 mL IVPB        1 g 200 mL/hr over 30 Minutes Intravenous Every 24 hours 12/27/2020 0634     12/16/2020 0530  cefTRIAXone (ROCEPHIN) 1 g in sodium chloride 0.9 % 100 mL IVPB        1 g 200 mL/hr over 30 Minutes Intravenous  Once 12/09/2020 0523 12/24/2020 0658       Assessment: 86YOM presenting with shortness of breath from nursing facility. Cardiac PMH includes HTN, stroke, PAD, CAD, ischemic cardiomyopathy paroxysmal Afib and history of mural thrombus. Estimated CHADS2Vasc of 7.   Per nursing facility [facility], patient is  currently taking warfarin 12 mg daily with last dose yesterday 9/5 at 1600. Baseline INR 2.1.  DDI Amiodarone (CYP2C9 inhibitor)- increased anticoagulant effect of warfarin Ceftriaxone- enhanced anticoagulant effect  CBC Baseline Hgb 10.7 (C/w BL), Plt 172.   Date INR Warfarin Dose  9/5 N/A 12 mg (PTA)  9/6 2.1 6 mg   Goal of Therapy:  INR 2-3   Plan:  Will give 50% of home dose due to multiple drug interactions, including amiodarone. Will give warfarin 6 mg x 1 today. Daily CBC, INR. Monitor s/sx bleeding.    Wynelle Cleveland, PharmD Pharmacy Resident  12/02/2020 10:14 AM

## 2020-12-06 NOTE — Progress Notes (Signed)
[name], 86 year old white male, admitted from [facility] rehab with acute on chronic hypoxia respiratory failure with cute on chronic systolic heart failure NSVT, (EF20%) had decompensated since admission (in ED waiting for admitted bed).  From nurse and ER physician, patient had acute hypoxic episode. Initially was planned to start bipap but paitent also with decreased mentation and not bipap candidate  On my initial exam, patient with only head nodding to question after noxious stimuli.   BBS with very little airflow.  Oxygen sats now high 90s but with NRB in place. Spoke with son Benjamin Cordova regarding notifying of change in patient condition and plan for further testing and need of step down care. Also discussed poor prognosis and treatment options could include comfort carte.  Chest xray showed Cardiomegaly with diffuse bilateral airspace disease and layering effusions, likely edema/CHF.  No real improvement since initiation of diuretic therapy ABG significant for critical CO2 level of 106 (pH 7.3, pO2 103, bicarb 52.2 - acute on chronic rep failure) Son, Benjamin Cordova updated of results and poor prognosis with little ability to treat his respiratory failure without intubation.  Son discussed with me that he has been through a lot and has had to suffer with his heart and resp failure for quite a while and will discuss further with his other brother comfort care measures and ultimately they decided to institute full comfort measures.  Discussed options of continuing current medication.  They wished to stop the amio and provide only comfort medications.  I advised patient will be able to go to medsurg area and will be updated with condition changes/

## 2020-12-06 NOTE — ED Notes (Signed)
Pt found to be destating on 4L O2 down into the 70's; pt now on non-rebreather.

## 2020-12-06 NOTE — ED Notes (Signed)
Provided pt with cup of water and assisted him with drinking it. Pt is quite funny and has a great dry sense of humor.

## 2020-12-06 NOTE — ED Notes (Signed)
Changed pt's gown and repositioned

## 2020-12-06 NOTE — ED Notes (Signed)
Meghan RN aware of assigned bed

## 2020-12-06 NOTE — ED Provider Notes (Signed)
Vitals:   12/19/2020 1700 12/23/2020 1746  BP: 109/62 109/62  Pulse: 89 91  Resp: (!) 31 18  Temp:    SpO2: 93% 92%     I was called to evaluate the patient urgently, nursing noted he appeared to be hypoxic with increased work of breathing.  I evaluated the patient and his SPO2 64% on 4 L nasal cannula, placed on nonrebreather with improvement to 95%.  He does have notably diminished lung sounds in the lower bases bilaterally without noted wheezing or rales.  I have placed the patient on BiPAP for relief of work of breathing.  He is alert and oriented to self.  Accessory muscle use is noted.  Updated Sharion Settler of the covering hospitalist service regarding change in patient condition and BiPAP intervention. A DNR status is noted in chart.   Delman Kitten, MD 12/26/2020 [phone]

## 2020-12-06 NOTE — ED Provider Notes (Signed)
[facility] Emergency Department Provider Note  ____________________________________________   Event Date/Time   First MD Initiated Contact with Patient 12/08/2020 0148     (approximate)  I have reviewed the triage vital signs and the nursing notes.   HISTORY  Chief Complaint Respiratory Distress    HPI Benjamin Cordova is a 86 y.o. male HTN, stroke, PAD, CAD/CABG, ischemic cardiomyopathy, paroxysmal A. fib, history of LV mural thrombus on warfarin who comes in with concerns for shortness of breath.  Patient had his first physical therapy today and they noticed his oxygen levels was in the 60s on 2 L.  They moved his oxygen levels up to 4 L and he is satting 100%.  Patient has periods of going into nonsustained V. tach longest about 10 to 15 seconds and then will come out.  Unable to get full HPI from patient due to difficulties understanding him.  I did call the son who stated that sometimes is hard to understand him because of his teeth or when he is just really sick.  Denies any known falls.   On review of records patient came in with altered mental status and hypoxia.  His troponin was significantly elevated.  Patient was found to have an EF of 20% that required diuresis patient was weaned down to 2 L of oxygen.          Past Medical History:  Diagnosis Date   Arrhythmia    atrial fibrillation   Arthritis    CHF (congestive heart failure) (HCC)    Coronary artery disease    a. s/p CABG in 1981 b. cath in 10/2014 showing occluded grafts --> redo CABG in 2016 LIMA-LAD and Left Radial-OM2   Essential hypertension    Hematuria    History of blood transfusion 07/2015   Knee replacement HgB <9   History of pneumonia    History of stroke    Ischemic cardiomyopathy    Left ventricular apical thrombus    Mixed hyperlipidemia    Myocardial infarction [facility]) 1980   Peripheral vascular disease ([place])    Polio osteopathy of lower leg (HCC) Age 81    Affected right leg   Skin cancer    TIA (transient ischemic attack) 1989   Chronic coumadin   UTI (lower urinary tract infection)     Patient Active Problem List   Diagnosis Date Noted   Acute respiratory failure ([place]) 11/08/2020   Hyperkalemia [id number]   Acute metabolic encephalopathy [id number]   NSVT (nonsustained ventricular tachycardia) ([place]) 11/08/2020   NSTEMI (non-ST elevated myocardial infarction) ([place]) 11/08/2020   Acute respiratory failure with hypoxia ([place]) 10/27/2020   Acute on chronic systolic CHF (congestive heart failure) ([place]) 10/26/2020   Elevated troponin 10/24/2020   CHF exacerbation ([place]) 10/23/2020   Hypotension 10/06/2020   Multiple rib fractures 10/05/2020   Uncontrolled pain 10/05/2020   AKI (acute kidney injury) ([place])    Weakness 06/30/2019   Dyspnea 06/30/2019   Subtherapeutic anticoagulation    Acute on chronic respiratory failure with hypoxia and hypercapnia (HCC)    Acute on chronic systolic HF (heart failure) (HCC)    LV (left ventricular) mural thrombus [id number]   Systolic and diastolic CHF, acute on chronic (HCC)    Hypokalemia    CHF (congestive heart failure) ([place]) 09/01/2015   Respiratory failure with hypoxia ([place]) 08/31/2015   HCAP (healthcare-associated pneumonia) 08/31/2015   Acute respiratory failure with hypoxia and hypercapnia ([place])    Encounter for central line placement  Knee swelling    Subtherapeutic international normalized ratio (INR) 08/08/2015   Constipation 08/08/2015   Hyperlipidemia 08/08/2015   Blurred vision 07/29/2015   Anemia 07/29/2015   S/P total knee replacement 07/27/2015   S/P CABG x 2 10/08/2014   Chest pain 10/01/2014   Elevated INR 10/01/2014   Leukocytosis 10/01/2014   Thrombocytopenia ([place]) 10/01/2014   Chest pain at rest 10/01/2014   Unstable angina ([place]) 10/01/2014   Coagulopathy ([place]) 10/01/2014   Paroxysmal atrial fibrillation ([place]) 10/01/2014   Coronary artery disease    Hypertension     Arthritis    Peripheral vascular disease ([place])    Essential hypertension     Past Surgical History:  Procedure Laterality Date   CARDIAC CATHETERIZATION N/A 10/05/2014   Procedure: Left Heart Cath and Coronary Angiography;  Surgeon: Peter M Martinique, MD;  Location: [place] [facility];  Service: Cardiovascular;  Laterality: N/A;   CORONARY ARTERY BYPASS GRAFT  1981   [facility]   CORONARY ARTERY BYPASS GRAFT N/A 10/08/2014   Procedure: REDO CORONARY ARTERY BYPASS GRAFTING (CABG) Times Two Grafts using Left Internal Mammary and Left Radial Artery;  Surgeon: Melrose Nakayama, MD;  Location: [place];  Service: Open Heart Surgery;  Laterality: N/A;   CYSTOSCOPY WITH INJECTION  10/10/2010   Procedure: CYSTOSCOPY WITH INJECTION;  Surgeon: Marissa Nestle;  Location: [facility];  Service: Urology;  Laterality: N/A;  with retrograde urethrogram   FOOT SURGERY Right 1954   [facility], muscle implantation   JOINT REPLACEMENT     RADIAL ARTERY HARVEST Left 10/08/2014   Procedure: Left RADIAL ARTERY HARVEST;  Surgeon: Melrose Nakayama, MD;  Location: [place];  Service: Open Heart Surgery;  Laterality: Left;   TEE WITHOUT CARDIOVERSION N/A 10/08/2014   Procedure: TRANSESOPHAGEAL ECHOCARDIOGRAM (TEE);  Surgeon: Melrose Nakayama, MD;  Location: [place];  Service: Open Heart Surgery;  Laterality: N/A;   TONSILLECTOMY  1949   [facility]   TOTAL KNEE ARTHROPLASTY Left 07/27/2015   Procedure: TOTAL LEFT KNEE ARTHROPLASTY;  Surgeon: [name], MD;  Location: [place];  Service: Orthopedics;  Laterality: Left;    Prior to Admission medications   Medication Sig Start Date End Date Taking? Authorizing Provider  acetaminophen (TYLENOL) 325 MG tablet Take 2 tablets (650 mg total) by mouth every 6 (six) hours. 10/11/20   Johnson, Clanford L, MD  albuterol (PROVENTIL) (2.5 MG/3ML) 0.083% nebulizer solution Take 2.5 mg by nebulization every 6 (six) hours as needed for wheezing or shortness of breath.  08/29/19   [provider]  ascorbic acid (VITAMIN C) 500 MG tablet Take 1,000 mg by mouth 2 (two) times daily.    [provider]  atorvastatin (LIPITOR) 80 MG tablet Take 1 tablet (80 mg total) by mouth at bedtime. 11/14/20   Terrilee Croak, MD  diphenhydrAMINE (BENADRYL) 50 MG capsule Take 1 capsule (50 mg total) by mouth at bedtime as needed for sleep. 10/11/20   Johnson, Clanford L, MD  finasteride (PROSCAR) 5 MG tablet Take 5 mg by mouth daily. 04/20/19   [provider]  folic acid (FOLVITE) Q000111Q MCG tablet Take 800 mcg by mouth daily.    [provider]  guaiFENesin (MUCINEX) 600 MG 12 hr tablet Take 1 tablet (600 mg total) by mouth 2 (two) times daily. 11/14/20   Dahal, Marlowe Aschoff, MD  ipratropium-albuterol (DUONEB) 0.5-2.5 (3) MG/3ML SOLN Take 3 mLs by nebulization every 4 (four) hours as needed (SOB/wheezing).    [provider]  metoprolol succinate (TOPROL-XL) 50 MG 24 hr tablet Take 1 tablet (50 mg total) by mouth daily. Take with or immediately following a meal. 10/29/20   Tat, Shanon Brow, MD  Multiple Vitamins-Minerals (CERTAVITE/ANTIOXIDANTS) TABS Take 1 tablet by mouth daily.     [provider]  polyethylene glycol (MIRALAX / GLYCOLAX) 17 g packet Take 17 g by mouth daily as needed for moderate constipation. 11/14/20   Terrilee Croak, MD  potassium chloride (K-DUR) 10 MEQ tablet Take 10 mEq by mouth daily. Take with Lasix (Furosemide) 03/18/18   [provider]  senna-docusate (SENOKOT-S) 8.6-50 MG tablet Take 1 tablet by mouth at bedtime. 10/11/20   Johnson, Clanford L, MD  spironolactone (ALDACTONE) 25 MG tablet Take 0.5 tablets (12.5 mg total) by mouth daily. 11/14/20 02/12/21  Terrilee Croak, MD  tamsulosin (FLOMAX) 0.4 MG CAPS capsule Take 0.4 mg by mouth in the morning.  12/02/18   [provider]  torsemide 40 MG TABS Take 40 mg by mouth 2 (two) times daily. 11/14/20   Terrilee Croak, MD  warfarin (COUMADIN) 4 MG tablet Take 2 tablets (8 mg total)  by mouth one time only at 4 PM. 11/14/20   Dahal, Marlowe Aschoff, MD    Allergies Patient has no known allergies.  Family History  Problem Relation Age of Onset   Heart attack Mother    Diabetes Mother    Heart attack Father    Heart attack Brother    Anesthesia problems Neg Hx    Hypotension Neg Hx    Malignant hyperthermia Neg Hx    Pseudochol deficiency Neg Hx     Social History Social History   Tobacco Use   Smoking status: Former    Packs/day: 1.00    Years: 28.00    Pack years: 28.00    Types: Cigarettes    Quit date: 09/22/1978    Years since quitting: 42.2   Smokeless tobacco: Never  Vaping Use   Vaping Use: Never used  Substance Use Topics   Alcohol use: No    Alcohol/week: 0.0 standard drinks   Drug use: No      Review of Systems Unable to get full review of systems due to difficulty understanding patient. ____________________________________________   PHYSICAL EXAM:  VITAL SIGNS: ED Triage Vitals  Enc Vitals Group     BP --      Pulse Rate 12/08/2020 0154 99     Resp 12/16/2020 0154 20     Temp 12/05/2020 0154 98.6 F (37 C)     Temp Source 12/13/2020 0154 Axillary     SpO2 12/17/2020 0153 100 %     Weight 12/05/2020 0155 217 lb 6 oz (98.6 kg)     Height 12/01/2020 0155 '5\' 8"'$  (1.727 m)     Head Circumference --      Peak Flow --      Pain Score 12/25/2020 0155 0     Pain Loc --      Pain Edu? --      Excl. in [place]? --     Constitutional: Alert and oriented. Well appearing and in no acute distress. Eyes: Conjunctivae are normal. EOMI. Head: Atraumatic. Nose: No congestion/rhinnorhea. Mouth/Throat: Mucous membranes are moist.  Dentures noted Neck: No stridor. Trachea Midline. FROM Cardiovascular: Normal rate and occasionally goes up faster.  Grossly normal heart sounds.  Good peripheral circulation. Respiratory: Normal respiratory effort.  No retractions. Lungs CTAB. Gastrointestinal: Soft and nontender. No distention. No abdominal bruits.  Musculoskeletal: No  lower  extremity tenderness nor edema.  No joint effusions. Neurologic: Slurred speech.  No gross focal neurologic deficits are appreciated.  Squeezes his hands. Skin:  Skin is warm, dry and intact. No rash noted. Psychiatric: Mood and affect are normal. Speech and behavior are normal. GU: Deferred   ____________________________________________   LABS (all labs ordered are listed, but only abnormal results are displayed)  Labs Reviewed  CBC WITH DIFFERENTIAL/PLATELET - Abnormal; Notable for the following components:      Result Value   WBC 12.8 (*)    RBC 3.51 (*)    Hemoglobin 10.7 (*)    HCT 34.4 (*)    Neutro Abs 11.6 (*)    Lymphs Abs 0.3 (*)    Abs Immature Granulocytes 0.35 (*)    All other components within normal limits  RESP PANEL BY RT-PCR (FLU A&B, COVID) ARPGX2  CULTURE, BLOOD (ROUTINE X 2)  CULTURE, BLOOD (ROUTINE X 2)  LACTIC ACID, PLASMA  BASIC METABOLIC PANEL  HEPATIC FUNCTION PANEL  MAGNESIUM  PROTIME-INR  BRAIN NATRIURETIC PEPTIDE  URINALYSIS, ROUTINE W REFLEX MICROSCOPIC  LACTIC ACID, PLASMA  TROPONIN I (HIGH SENSITIVITY)   ____________________________________________   ED ECG REPORT I, Vanessa [place], the attending physician, personally viewed and interpreted this ECG.  Initial EKG is sinus rate of 99, no ST elevation, T wave inversion in V5 and V6 with occasional PVC  Repeat EKG has about 8 seconds of nonsustained V. tach but then will break out of it on his own without any intervention. ____________________________________________  RADIOLOGY Benjamin Cordova, personally viewed and evaluated these images (plain radiographs) as part of my medical decision making, as well as reviewing the written report by the radiologist.  ED MD interpretation: Some small bilateral effusions  Official radiology report(s): DG Chest Portable 1 View  Result Date: 12/14/2020 CLINICAL DATA:  Dyspnea EXAM: PORTABLE CHEST 1 VIEW COMPARISON:  11/13/2020 FINDINGS: Lung  volumes are small and pulmonary insufflation has diminished when compared to prior examination. Left lateral pleural thickening or loculated pleural effusion is unchanged. Small bilateral pleural effusions are present. No superimposed focal pulmonary infiltrate. No pneumothorax. Coronary artery bypass grafting has been performed. Mild cardiomegaly is stable. Pulmonary vascularity is normal. IMPRESSION: Progressive pulmonary hypoinflation. Stable laterally loculated left pleural effusion versus pleural thickening. Stable small bilateral pleural effusions. Stable cardiomegaly. Electronically Signed   By: Fidela Salisbury M.D.   On: 12/16/2020 02:27    ____________________________________________   PROCEDURES  Procedure(s) performed (including Critical Care):  .1-3 Lead EKG Interpretation  Date/Time: 12/05/2020 2:55 AM Performed by: Vanessa [place], MD Authorized by: Vanessa [place]in[place], MD     Interpretation: abnormal     ECG rate:  90s   ECG rate assessment: normal     Rhythm: sinus rhythm     Ectopy: PVCs     Conduction: normal   Comments:     Patient will have sinus rhythm and then go into episodes of nonsustained V. tach lasting up to 10 seconds. .Critical Care  Date/Time: 12/03/2020 5:39 AM Performed by: Vanessa [place], MD Authorized by: Vanessa , MD   Critical care provider statement:    Critical care time (minutes):  45   Critical care was necessary to treat or prevent imminent or life-threatening deterioration of the following conditions:  Respiratory failure and cardiac failure   Critical care was time spent personally by me on the following activities:  Discussions with consultants, evaluation of patient's response to treatment, examination of patient, ordering  and performing treatments and interventions, ordering and review of laboratory studies, ordering and review of radiographic studies, pulse oximetry, re-evaluation of patient's condition, obtaining history from patient or  surrogate and review of old charts   ____________________________________________   INITIAL IMPRESSION / ASSESSMENT AND PLAN / ED COURSE  Benjamin Cordova was evaluated in Emergency Department on 12/13/2020 for the symptoms described in the history of present illness. He was evaluated in the context of the global COVID-19 pandemic, which necessitated consideration that the patient might be at risk for infection with the SARS-CoV-2 virus that causes COVID-19. Institutional protocols and algorithms that pertain to the evaluation of patients at risk for COVID-19 are in a state of rapid change based on information released by regulatory bodies including the CDC and federal and state organizations. These policies and algorithms were followed during the patient's care in the ED.    Patient comes in with concern for episodes of nonsustained V. tach in the setting of new hypoxia.  Recent admission for heart failure.  Unable to get full history from patient.  Patient is DNR and this was confirmed with his son however they are okay with cardioversion if he does go into a sustained V. tach.  Given the frequent amount of nonsustained V. tach we will start on amiodarone.  We will check labs to evaluate for low magnesium, low potassium other electrolyte abnormalities, AKI.  We will get COVID swab, chest x-ray to evaluate for pneumonia, pulmonary edema.  We will keep patient the cardiac monitor.  Given I can get a great history from patient I did proceed with CT imaging pan scan.  There is no evidence of PE that shows his normal effusions.  We will give a dose of IV Lasix given his BNP is elevated and he Was hypoxic with laying flat and had to be moved up to 6 L of oxygen.  There was concern for potential UTI and a CT scan so we will start him on a dose of ceftriaxone.  Technically meet sepsis criteria but I do not want to give full fluid resuscitation due to my concern for CHF and EF of 20% and worsening oxygen  requirements however we will start ceftriaxone to cover for UTI.  Since being on the amiodarone patient's episodes of nonsustained V. tach have been less.  Is never required cardioversion.  Did confirm with the son that patient is DNR but if he was in a sustained V. tach that they would be okay with cardioversion.  I discussed with the ICU team Hinton Dyer who recommends that I talk with the hospitalist for admission to stepdown.       ____________________________________________   FINAL CLINICAL IMPRESSION(S) / ED DIAGNOSES   Final diagnoses:  Acute respiratory failure with hypoxia ([place])  Acute on chronic right-sided congestive heart failure (HCC)  NSVT (nonsustained ventricular tachycardia) ([place])      MEDICATIONS GIVEN DURING THIS VISIT:  Medications  amiodarone (NEXTERONE) 1.8 mg/mL load via infusion 150 mg (150 mg Intravenous Bolus from Bag 12/21/2020 0209)    Followed by  amiodarone (NEXTERONE PREMIX) 360-4.14 MG/200ML-% (1.8 mg/mL) IV infusion (60 mg/hr Intravenous New Bag/Given 12/03/2020 0423)    Followed by  amiodarone (NEXTERONE PREMIX) 360-4.14 MG/200ML-% (1.8 mg/mL) IV infusion (has no administration in time range)  cefTRIAXone (ROCEPHIN) 1 g in sodium chloride 0.9 % 100 mL IVPB (has no administration in time range)  furosemide (LASIX) injection 40 mg (has no administration in time range)  iohexol (OMNIPAQUE) 350 MG/ML  injection 100 mL (100 mLs Intravenous Contrast Given 12/13/2020 0345)     ED Discharge Orders     None        Note:  This document was prepared using Dragon voice recognition software and may include unintentional dictation errors.    Vanessa Nelson, MD 12/04/2020 [phone]

## 2020-12-06 NOTE — ED Notes (Signed)
Assisted pt with dinner, pt ate 5 bites of chocolate pudding and drank 4 times from milk. Pt states, "finished" when asked if he wanted more he continued to state "finish, no more". Meghan, RN notified.

## 2020-12-06 NOTE — ED Notes (Signed)
Pt assisted with lunch tray. Ate most of it, enjoying potato soup and pudding, as well as milk

## 2020-12-06 NOTE — ED Notes (Addendum)
Pt provided breakfast. Son at bedside to assist pt.

## 2020-12-06 NOTE — Consult Note (Addendum)
Cardiology Consultation:   Patient ID: [name] MRN: [id number]; DOB: 1934/11/21  Admit date: 12/09/2020 Date of Consult: 12/14/2020  PCP:  Pablo Lawrence, NP   [facility]  Cardiologist:  Rozann Lesches, MD  Advanced Practice Provider:  No care team member to display Electrophysiologist:  None   Patient Profile:   Benjamin Cordova is a 86 y.o. male  with history of CAD/CABG, redo CABG 2016, recent NSTEMI admission 10/2020 with [place] declined and treated medically, chronic HFrEF due to ischemic cardiomyopathy complicated by LV thrombus on PTA Coumadin with EF 20% by echo 09/2020, HTN, HLD, and who is seen today for the evaluation of NSVT and shortness of breath at the request of Dr. Posey Pronto.  History of Present Illness:   [name] is an 86 year old male with recurrent admissions for respiratory failure.  He was recently admitted 8/10 -11/14/20 to [facility] for exacerbation of chronic HFrEF with possible superimposed pneumonia.   During this admission, troponin was elevated and concerning for myocardial ischemia with catheterization declined by the patient.  It was also noted he was a poor candidate for catheterization given his mental and respiratory status.  He was also DNR/DNI and would have elevated probability for further decompensation, requiring mechanical ventilation.  He was IV diuresed and antibiotics administered.  Before discharge, it was noted by the rounding cardiology team that he was still mildly volume overloaded with torsemide increased to 40 mg twice daily.  Close follow-up was recommended with labs in the office.  He was discharged to rehab.  He was continued on anticoagulation for LV thrombus.    He was seen at the HF clinic 11/24/2020.  Per documentation, he reported moderate but chronic shortness of breath with little exertion.  Notes also indicate report of associated cough and palpitations, as well as rectal pain from sitting in his wheelchair.  He  reported periodic weights at rehab.  Unfortunately, a medication list was not sent with him.  No medication changes.  Today 12/14/2020, he presents to the [facility] ED with hypoxia from [place], where his oxygen saturations were noted to be down to the 60s on 2 L nasal cannula oxygen after he reported shortness of breath.  In the emergency department, he was noted to have frequent PVCs on telemetry with runs of NSVT between 2-3AM the longest of which was 23 beats.  He was started on IV amiodarone with rates in the 90s.  Initial labs showed K 4.4, Mg 2.4, TSH 1.9, Cr 1.08, BUN 45, AST 19, ALT 18, BNP 1,508.9, lactic acid 1.4, HS Tn 66  66, Hgb 10.7, HCT 34.4, INR 2.1, iron 33  Respiratory panel negative.  UA with hemoglobin and leukocyyes.  Urine and blood cultures in progress.  FOBT pending.  Chest x-ray with progressive pulmonary hypoinflation, stable loculated left pleural effusion versus pleural thickening, stable small bilateral pleural effusions, stable cardiomegaly.  CTA of the chest and abdomen showed no convincing pulmonary embolus with consideration of degraded image from motion, right greater than left partially loculated pleural effusions stable since last month, compressive atelectasis, large stool ball/fecal impaction, mild to moderate left hydronephrosis and left hydroureter with abrupt tapering of the left ureter at the pelvic inlet/possible ureteral stricture, possible bilateral renal insufficiency, possible cystitis, stable cardiomegaly with prior CABG and aortic atherosclerosis.  CT head with chronic right MCA infarct. At the time of cardiology consultation, ROS obtained from pt with a louder voice given hearing impairment and with consideration that he  might not be the best historian as  documented above. He states he is here after he felt SOB, though notes this as the first time since discharge that he felt SOB, despite the documentation from the HF clinic as above. He reports relief of  SOB with O2. He denies any current CP or SOB on oxygen. No tachypalpitations. No dizziness. He denies any s/sx of heart failure other than the SOB. He is unable to state if he is taking his current medications.  Past Medical History:  Diagnosis Date   Arrhythmia    atrial fibrillation   Arthritis    CHF (congestive heart failure) (HCC)    Coronary artery disease    a. s/p CABG in 1981 b. 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[place]) Age 80   Affected right leg   Skin cancer    TIA (transient ischemic attack) 1989   Chronic coumadin   UTI (lower urinary tract infection)     Past Surgical History:  Procedure Laterality Date   CARDIAC CATHETERIZATION N/A 10/05/2014   Procedure: Left Heart Cath and Coronary Angiography;  Surgeon: Peter M Martinique, MD;  Location: [place] [facility];  Service: Cardiovascular;  Laterality: N/A;   CORONARY ARTERY BYPASS GRAFT  1981   [facility]   CORONARY ARTERY BYPASS GRAFT N/A 10/08/2014   Procedure: REDO CORONARY ARTERY BYPASS GRAFTING (CABG) Times Two Grafts using Left Internal Mammary and Left Radial Artery;  Surgeon: Melrose Nakayama, MD;  Location: [place];  Service: Open Heart Surgery;  Laterality: N/A;   CYSTOSCOPY WITH INJECTION  10/10/2010   Procedure: CYSTOSCOPY WITH INJECTION;  Surgeon: Marissa Nestle;  Location: [facility];  Service: Urology;  Laterality: N/A;  with retrograde urethrogram   FOOT SURGERY Right 1954   [facility], muscle implantation   JOINT REPLACEMENT     RADIAL ARTERY HARVEST Left 10/08/2014   Procedure: Left RADIAL ARTERY HARVEST;  Surgeon:  Melrose Nakayama, MD;  Location: [place];  Service: Open Heart Surgery;  Laterality: Left;   TEE WITHOUT CARDIOVERSION N/A 10/08/2014   Procedure: TRANSESOPHAGEAL ECHOCARDIOGRAM (TEE);  Surgeon: Melrose Nakayama, MD;  Location: [place];  Service: Open Heart Surgery;  Laterality: N/A;   TONSILLECTOMY  1949   [facility]   TOTAL KNEE ARTHROPLASTY Left 07/27/2015   Procedure: TOTAL LEFT KNEE ARTHROPLASTY;  Surgeon: [name], MD;  Location: [place];  Service: Orthopedics;  Laterality: Left;     Home Medications:  Prior to Admission medications   Medication Sig Start Date End Date Taking? Authorizing Provider  acetaminophen (TYLENOL) 325 MG tablet Take 2 tablets (650 mg total) by mouth every 6 (six) hours. 10/11/20  Yes Johnson, Clanford L, MD  amoxicillin-clavulanate (AUGMENTIN) 875-125 MG tablet Take 1 tablet by mouth 2 (two) times daily.   Yes [provider]  ascorbic acid (VITAMIN C) 500 MG tablet Take 1,000 mg by mouth 2 (two) times daily.   Yes [provider]  atorvastatin (LIPITOR) 80 MG tablet Take 1 tablet (80 mg total) by  mouth at bedtime. 11/14/20  Yes Dahal, Marlowe Aschoff, MD  finasteride (PROSCAR) 5 MG tablet Take 5 mg by mouth daily. 04/20/19  Yes [provider]  folic acid (FOLVITE) Q000111Q MCG tablet Take 800 mcg by mouth daily.   Yes [provider]  guaiFENesin (MUCINEX) 600 MG 12 hr tablet Take 1 tablet (600 mg total) by mouth 2 (two) times daily. 11/14/20  Yes Dahal, Marlowe Aschoff, MD  ipratropium-albuterol (DUONEB) 0.5-2.5 (3) MG/3ML SOLN Take 3 mLs by nebulization every 4 (four) hours as needed (SOB/wheezing).   Yes [provider]  metoprolol succinate (TOPROL-XL) 50 MG 24 hr tablet Take 1 tablet (50 mg total) by mouth daily. Take with or immediately following a meal. 10/29/20  Yes Tat, David, MD  Multiple Vitamins-Minerals (CERTAVITE/ANTIOXIDANTS) TABS Take 1 tablet by mouth daily.    Yes [provider]  potassium chloride (K-DUR) 10 MEQ  tablet Take 10 mEq by mouth daily. Take with Lasix (Furosemide) 03/18/18  Yes [provider]  senna-docusate (SENOKOT-S) 8.6-50 MG tablet Take 1 tablet by mouth at bedtime. 10/11/20  Yes Johnson, Clanford L, MD  spironolactone (ALDACTONE) 25 MG tablet Take 0.5 tablets (12.5 mg total) by mouth daily. 11/14/20 02/12/21 Yes Dahal, Marlowe Aschoff, MD  tamsulosin (FLOMAX) 0.4 MG CAPS capsule Take 0.4 mg by mouth in the morning.  12/02/18  Yes [provider]  torsemide 40 MG TABS Take 40 mg by mouth 2 (two) times daily. 11/14/20  Yes Dahal, Marlowe Aschoff, MD  warfarin (COUMADIN) 10 MG tablet Take 10 mg by mouth daily at 4 PM. Take along with one 2 mg tablet for total 12 mg once daily   Yes [provider]  warfarin (COUMADIN) 2 MG tablet Take 2 mg by mouth daily at 4 PM. Take along with one 10 mg tablet for total 12 mg once daily   Yes [provider]  albuterol (PROVENTIL) (2.5 MG/3ML) 0.083% nebulizer solution Take 2.5 mg by nebulization every 6 (six) hours as needed for wheezing or shortness of breath.  08/29/19   [provider]  diphenhydrAMINE (BENADRYL) 50 MG capsule Take 1 capsule (50 mg total) by mouth at bedtime as needed for sleep. 10/11/20   Johnson, Clanford L, MD  polyethylene glycol (MIRALAX / GLYCOLAX) 17 g packet Take 17 g by mouth daily as needed for moderate constipation. 11/14/20   Terrilee Croak, MD  warfarin (COUMADIN) 4 MG tablet Take 2 tablets (8 mg total) by mouth one time only at 4 PM. Patient not taking: No sig reported 11/14/20   Terrilee Croak, MD    Inpatient Medications: Scheduled Meds:  ascorbic acid  1,000 mg Oral BID   atorvastatin  80 mg Oral QHS   finasteride  5 mg Oral Daily   furosemide  40 mg Intravenous Once   metoprolol succinate  50 mg Oral Daily   multivitamin with minerals  1 tablet Oral Daily   pantoprazole (PROTONIX) IV  40 mg Intravenous Q12H   sodium chloride flush  3 mL Intravenous Q12H   sodium chloride flush  3 mL Intravenous  Q12H   spironolactone  12.5 mg Oral Daily   tamsulosin  0.4 mg Oral q AM   warfarin  6 mg Oral ONCE-1600   Warfarin - Pharmacist Dosing Inpatient   Does not apply q1600   Continuous Infusions:  sodium chloride     amiodarone 30 mg/hr (12/17/2020 0847)   [START ON Dec 24, 2020] cefTRIAXone (ROCEPHIN)  IV     PRN Meds: sodium chloride, acetaminophen **OR**  acetaminophen, albuterol, hydrALAZINE, ipratropium-albuterol, sodium chloride flush  Allergies:   No Known Allergies  Social History:   Social History   Socioeconomic History   Marital status: Widowed    Spouse name: Not on file   Number of children: Not on file   Years of education: Not on file   Highest education level: Not on file  Occupational History   Not on file  Tobacco Use   Smoking status: Former    Packs/day: 1.00    Years: 28.00    Pack years: 28.00    Types: Cigarettes    Quit date: 09/22/1978    Years since quitting: 42.2   Smokeless tobacco: Never  Vaping Use   Vaping Use: Never used  Substance and Sexual Activity   Alcohol use: No    Alcohol/week: 0.0 standard drinks   Drug use: No   Sexual activity: Not on file  Other Topics Concern   Not on file  Social History Narrative   Not on file   Social Determinants of Health   Financial Resource Strain: Not on file  Food Insecurity: Not on file  Transportation Needs: Not on file  Physical Activity: Not on file  Stress: Not on file  Social Connections: Not on file  Intimate Partner Violence: Not on file    Family History:    Family History  Problem Relation Age of Onset   Heart attack Mother    Diabetes Mother    Heart attack Father    Heart attack Brother    Anesthesia problems Neg Hx    Hypotension Neg Hx    Malignant hyperthermia Neg Hx    Pseudochol deficiency Neg Hx      ROS:  Please see the history of present illness.  Review of Systems  Unable to perform ROS: Mental status change  Respiratory:  Positive for shortness of breath.  Negative for cough.   Cardiovascular:  Negative for chest pain, palpitations, orthopnea, leg swelling and PND.  Musculoskeletal:  Negative for falls.  Neurological:  Negative for dizziness and weakness.   All other ROS reviewed and negative.     Physical Exam/Data:   Vitals:   12/17/2020 1200 12/01/2020 1215 12/30/2020 1230 12/17/2020 1255  BP: 118/68 110/71 108/65   Pulse: 86 90 62 88  Resp: (!) 35 (!) 30 (!) 36 (!) 24  Temp:      TempSrc:      SpO2: 94% 92% 92% 94%  Weight:      Height:        Intake/Output Summary (Last 24 hours) at 12/21/2020 1307 Last data filed at 12/09/2020 0915 Gross per 24 hour  Intake 199.65 ml  Output 450 ml  Net -250.35 ml   Last 3 Weights 12/02/2020 11/24/2020 11/13/2020  Weight (lbs) 217 lb 6 oz 217 lb 5 oz 226 lb 14.4 oz  Weight (kg) 98.6 kg 98.572 kg 102.921 kg     Body mass index is 33.05 kg/m.  General: Frail / elderly male, hard of hearing, appears ill and pale, at times staring up at the ceiling / appearing confused and at other times able to interact/respond HEENT: [place] O2 in place Neck: Unable to assess JVD due to position of patient Vascular: No carotid bruits; radial pulses 2+ bilaterally Cardiac:  normal S1, S2; RRR; no murmur  Lungs: Bibasilar reduced breath sounds with limited lung exam due to pt weakness and not able to sit up on his own   Abd: soft, nontender, no hepatomegaly  Ext: no significant pitting edema, though L leg greater in size than right Musculoskeletal:  weak, deconditioned Skin: warm and dry  Neuro/psych: At times appears confused and not responsive  EKG:  The EKG was personally reviewed and demonstrates:  NSR with 20 beat run of NSVT and overall ventricular rate 185 bpm, Q wave/TWI, and poor R wave progression in lead III, Q wave in V4 and TWI in V4-V5 Telemetry:  Telemetry was personally reviewed and demonstrates: NSR -ST with NSVT between 2 AM and 3 AM with 23 beat run of NSVT at approximately 2:13 AM, earlier frequent  PVCs  Relevant CV Studies: Echo 10/07/2020 1. LVEF is severely depressed with severe hypokineis of the mid/distal  ventricle; distal anterior, dsital inferior and apical akinesis. Note apex  is not well seen in this bedside study COmpared to 2021 no significant  change . Left ventricular ejection  fraction, by estimation, is 20%%. The left ventricular internal cavity  size was severely dilated. Left ventricular diastolic parameters are  indeterminate.   2. Right ventricular systolic function is normal. The right ventricular  size is normal. There is normal pulmonary artery systolic pressure.   3. Trivial mitral valve regurgitation.   4. The aortic valve is tricuspid. Aortic valve regurgitation is not  visualized. Mild aortic valve sclerosis is present, with no evidence of  aortic valve stenosis.     MPI 2017   IMPRESSION: 1. Large fixed defect at the apex extending into the anterior and inferior walls. Concern for peri-infarct ischemia in the anterior wall and lower septum.   2. Severe diffuse hypokinesia with akinesia in the areas of scar.   3. Left ventricular ejection fraction 26%   4. Non invasive risk stratification*: High risk   *2012 Appropriate Use Criteria for Coronary Revascularization Focused Update: J Am Coll Cardiol. [id number]. http://content.airportbarriers.com.aspx?articleid=1201161     Electronically Signed   By: Rolm Baptise M.D.   On: 09/03/2015 13:09     Cardiac cath 2016 Prox LAD lesion, 80% stenosed. Mid LAD lesion, 100% stenosed. 1st Diag-1 lesion, 90% stenosed. 1st Diag-2 lesion, 100% stenosed. 1st Mrg lesion, 100% stenosed. 2nd Mrg lesion, 90% stenosed. Prox RCA to Mid RCA lesion, 90% stenosed. Mid RCA to Dist RCA lesion, 100% stenosed. Acute Mrg lesion, 90% stenosed. SVG . Origin lesion, before 1st Diag, 100% stenosed. Origin lesion, 100% stenosed. Origin lesion, 100% stenosed. There is moderate to severe left ventricular  systolic dysfunction.   1. Severe 3 vessel obstructive/occlusive CAD. 2. All grafts are occluded including SVG to diagonal/LAD, SVG to OM1, and SVG to RCA. 3. Moderate to severe LV dysfunction. 4. The LIMA is widely patent as was not used previously for bypass.   Recommendation: consider for redo CABG  Laboratory Data:  High Sensitivity Troponin:   Recent Labs  Lab 11/08/20 1533 11/08/20 1813 11/10/20 1451 12/13/2020 0201 12/26/2020 0424  TROPONINIHS 2,805* 3,275* 861* 66* 66*     Chemistry Recent Labs  Lab 12/27/2020 0201  NA 138  K 4.4  CL 86*  CO2 43*  GLUCOSE 157*  BUN 45*  CREATININE 1.08  CALCIUM 8.9  GFRNONAA >60  ANIONGAP 9    Recent Labs  Lab 12/23/2020 0201  PROT 6.8  ALBUMIN 3.9  AST 19  ALT 18  ALKPHOS 54  BILITOT 0.8   Hematology Recent Labs  Lab 12/19/2020 0201 12/23/2020 0740  WBC 12.8*  --   RBC 3.51* 3.45*  HGB 10.7*  --   HCT 34.4*  --  MCV 98.0  --   MCH 30.5  --   MCHC 31.1  --   RDW 14.4  --   PLT 172  --    BNP Recent Labs  Lab 12/16/2020 0201  BNP 1,508.9*    DDimer No results for input(s): DDIMER in the last 168 hours.   Radiology/Studies:  CT HEAD WO CONTRAST (5MM)  Result Date: 12/27/2020 CLINICAL DATA:  86 year old male with acute respiratory distress, V-tach. Abdominal distension. EXAM: CT HEAD WITHOUT CONTRAST TECHNIQUE: Contiguous axial images were obtained from the base of the skull through the vertex without intravenous contrast. COMPARISON:  Head CT 11/08/2020 and earlier. FINDINGS: Brain: Chronic right MCA infarct with encephalomalacia. Stable cerebral volume. Stable mild ex vacuo enlargement of the right lateral ventricle. No midline shift, ventriculomegaly, mass effect, evidence of mass lesion, intracranial hemorrhage or evidence of cortically based acute infarction. Stable gray-white matter differentiation throughout the brain. Vascular: Calcified atherosclerosis at the skull base. No suspicious intracranial vascular  hyperdensity. Skull: No acute osseous abnormality identified. Hyperostosis frontalis, normal variant. Sinuses/Orbits: Visualized paranasal sinuses and mastoids are stable and generally well aerated. Chronic left maxillary mucous retention cysts. Mild right mastoid effusion. Other: No acute orbit or scalp soft tissue finding. IMPRESSION: 1. No acute intracranial abnormality. 2. Chronic Right MCA infarct. Electronically Signed   By: Genevie Ann M.D.   On: 12/27/2020 05:03   CT Angio Chest PE W and/or Wo Contrast  Result Date: 12/25/2020 CLINICAL DATA:  86 year old male with acute respiratory distress, V-tach. Abdominal distension. EXAM: CT ANGIOGRAPHY CHEST CT ABDOMEN AND PELVIS WITH CONTRAST TECHNIQUE: Multidetector CT imaging of the chest was performed using the standard protocol during bolus administration of intravenous contrast. Multiplanar CT image reconstructions and MIPs were obtained to evaluate the vascular anatomy. Multidetector CT imaging of the abdomen and pelvis was performed using the standard protocol during bolus administration of intravenous contrast. CONTRAST:  166m OMNIPAQUE IOHEXOL 350 MG/ML SOLN COMPARISON:  CTA chest 11/08/2020. CT Abdomen and Pelvis 09/13/2010. FINDINGS: CTA CHEST FINDINGS Cardiovascular: Good contrast bolus timing in the pulmonary arterial tree. Respiratory motion. No central or saddle embolus. Heterogeneous appearance of left upper lobe pulmonary arteries on series 5, image 83 is felt more likely to be motion artifact than small peripheral PE, and the distal branches there appear to remain patent. Similar degraded detail of other bilateral distal pulmonary arteries. No convincing pulmonary embolus. Prior CABG. Stable cardiomegaly. No pericardial effusion. Calcified aortic atherosclerosis. Little contrast in the aorta today. Mediastinum/Nodes: Stable.  No mediastinal lymphadenopathy. Lungs/Pleura: Partially layering and loculated bilateral pleural effusions are stable since  last month, moderate on the right and smaller on the left. Some chronic left pleural thickening is redemonstrated. Respiratory motion artifact. Atelectatic changes to the major airways which remain patent. Trace retained secretions in the dependent trachea near the thoracic inlet. Ongoing lower lobe compressive atelectasis suspected, with some enhancement of the compressed right lower lobe. Overall ventilation has not significantly changed since last month. Musculoskeletal: Prior sternotomy. Chronic right lateral 6th through 8th rib fractures. Chronic right posterior 7th through 10th rib fractures also appear stable. No new osseous abnormality. Review of the MIP images confirms the above findings. CT ABDOMEN and PELVIS FINDINGS Hepatobiliary: Chronic surgical clips along the left hepatic lobe are stable since 2012. Partially contracted gallbladder. No definite pericholecystic inflammation. Liver enhancement is within normal limits. No bile duct enlargement. Pancreas: Negative. Spleen: Negative. Adrenals/Urinary Tract: Stable adrenal glands, small right adrenal lateral limb adenoma is unchanged since 2012. Multiple bilateral  renal cysts. The right kidney appears nonobstructed with diminutive renal pelvis and decompressed ureter to the bladder. On the left there is mild left hydronephrosis and asymmetric left hydroureter which drastically tapers as the ureter crosses into the pelvis. See series 2, images 61 through 80. The distal ureter then is decompressed. No ureteral calculus or obstructing etiology identified. Absence of renal contrast excretion on the delayed images is symmetric. The bladder is decompressed by Foley catheter but thick-walled. The bladder wall appears indistinct. Stomach/Bowel: Large stool ball in the rectosigmoid colon. See sagittal image 77. Mild presacral stranding is associated. Redundant proximal sigmoid colon. Intermittent retained stool throughout the large bowel upstream of the sigmoid. No  large bowel wall thickening. Normal appendix suspected on series 2, image 67. Decompressed terminal ileum. No dilated small bowel. Decompressed stomach and duodenum. No free air. No free fluid. Vascular/Lymphatic: Aortoiliac calcified atherosclerosis. Major arterial structures remain patent. Portal venous system appears patent. No lymphadenopathy. Reproductive: Urethral catheter in place, otherwise negative. Other: No pelvic free fluid. Musculoskeletal: Progressed lumbar spine degeneration since 2012. No acute osseous abnormality identified. Review of the MIP images confirms the above findings. IMPRESSION: 1. Motion degraded Chest CTA with no convincing pulmonary embolus. 2. Stable ventilation. Right > left partially loculated pleural effusions are stable since last month, moderate on the right. Stable associated compressive atelectasis. 3. Large stool ball in the rectosigmoid colon with mild presacral stranding suggests Fecal Impaction. No other bowel inflammation. 4. Mild to moderate left hydronephrosis and left hydroureter with abrupt tapering of the left ureter at the pelvic inlet is suspicious for a Ureteral Stricture. No obstructing calculus identified. Also, absence contrast excretion on the delayed images raises the possibility of bilateral renal insufficiency. 5. Bladder decompressed by Foley catheter. Superimposed bladder wall thickening and indistinctness raises the possibility of UTI/Cystitis. 6. Stable cardiomegaly. Prior CABG. Aortic Atherosclerosis (ICD10-I70.0). Electronically Signed   By: Genevie Ann M.D.   On: 12/26/2020 05:18   CT ABDOMEN PELVIS W CONTRAST  Result Date: 12/02/2020 CLINICAL DATA:  86 year old male with acute respiratory distress, V-tach. Abdominal distension. EXAM: CT ANGIOGRAPHY CHEST CT ABDOMEN AND PELVIS WITH CONTRAST TECHNIQUE: Multidetector CT imaging of the chest was performed using the standard protocol during bolus administration of intravenous contrast. Multiplanar CT image  reconstructions and MIPs were obtained to evaluate the vascular anatomy. Multidetector CT imaging of the abdomen and pelvis was performed using the standard protocol during bolus administration of intravenous contrast. CONTRAST:  192m OMNIPAQUE IOHEXOL 350 MG/ML SOLN COMPARISON:  CTA chest 11/08/2020. CT Abdomen and Pelvis 09/13/2010. FINDINGS: CTA CHEST FINDINGS Cardiovascular: Good contrast bolus timing in the pulmonary arterial tree. Respiratory motion. No central or saddle embolus. Heterogeneous appearance of left upper lobe pulmonary arteries on series 5, image 83 is felt more likely to be motion artifact than small peripheral PE, and the distal branches there appear to remain patent. Similar degraded detail of other bilateral distal pulmonary arteries. No convincing pulmonary embolus. Prior CABG. Stable cardiomegaly. No pericardial effusion. Calcified aortic atherosclerosis. Little contrast in the aorta today. Mediastinum/Nodes: Stable.  No mediastinal lymphadenopathy. Lungs/Pleura: Partially layering and loculated bilateral pleural effusions are stable since last month, moderate on the right and smaller on the left. Some chronic left pleural thickening is redemonstrated. Respiratory motion artifact. Atelectatic changes to the major airways which remain patent. Trace retained secretions in the dependent trachea near the thoracic inlet. Ongoing lower lobe compressive atelectasis suspected, with some enhancement of the compressed right lower lobe. Overall ventilation has not significantly changed  since last month. Musculoskeletal: Prior sternotomy. Chronic right lateral 6th through 8th rib fractures. Chronic right posterior 7th through 10th rib fractures also appear stable. No new osseous abnormality. Review of the MIP images confirms the above findings. CT ABDOMEN and PELVIS FINDINGS Hepatobiliary: Chronic surgical clips along the left hepatic lobe are stable since 2012. Partially contracted gallbladder. No  definite pericholecystic inflammation. Liver enhancement is within normal limits. No bile duct enlargement. Pancreas: Negative. Spleen: Negative. Adrenals/Urinary Tract: Stable adrenal glands, small right adrenal lateral limb adenoma is unchanged since 2012. Multiple bilateral renal cysts. The right kidney appears nonobstructed with diminutive renal pelvis and decompressed ureter to the bladder. On the left there is mild left hydronephrosis and asymmetric left hydroureter which drastically tapers as the ureter crosses into the pelvis. See series 2, images 61 through 80. The distal ureter then is decompressed. No ureteral calculus or obstructing etiology identified. Absence of renal contrast excretion on the delayed images is symmetric. The bladder is decompressed by Foley catheter but thick-walled. The bladder wall appears indistinct. Stomach/Bowel: Large stool ball in the rectosigmoid colon. See sagittal image 77. Mild presacral stranding is associated. Redundant proximal sigmoid colon. Intermittent retained stool throughout the large bowel upstream of the sigmoid. No large bowel wall thickening. Normal appendix suspected on series 2, image 67. Decompressed terminal ileum. No dilated small bowel. Decompressed stomach and duodenum. No free air. No free fluid. Vascular/Lymphatic: Aortoiliac calcified atherosclerosis. Major arterial structures remain patent. Portal venous system appears patent. No lymphadenopathy. Reproductive: Urethral catheter in place, otherwise negative. Other: No pelvic free fluid. Musculoskeletal: Progressed lumbar spine degeneration since 2012. No acute osseous abnormality identified. Review of the MIP images confirms the above findings. IMPRESSION: 1. Motion degraded Chest CTA with no convincing pulmonary embolus. 2. Stable ventilation. Right > left partially loculated pleural effusions are stable since last month, moderate on the right. Stable associated compressive atelectasis. 3. Large  stool ball in the rectosigmoid colon with mild presacral stranding suggests Fecal Impaction. No other bowel inflammation. 4. Mild to moderate left hydronephrosis and left hydroureter with abrupt tapering of the left ureter at the pelvic inlet is suspicious for a Ureteral Stricture. No obstructing calculus identified. Also, absence contrast excretion on the delayed images raises the possibility of bilateral renal insufficiency. 5. Bladder decompressed by Foley catheter. Superimposed bladder wall thickening and indistinctness raises the possibility of UTI/Cystitis. 6. Stable cardiomegaly. Prior CABG. Aortic Atherosclerosis (ICD10-I70.0). Electronically Signed   By: Genevie Ann M.D.   On: 12/19/2020 05:18   DG Chest Portable 1 View  Result Date: 12/28/2020 CLINICAL DATA:  Dyspnea EXAM: PORTABLE CHEST 1 VIEW COMPARISON:  11/13/2020 FINDINGS: Lung volumes are small and pulmonary insufflation has diminished when compared to prior examination. Left lateral pleural thickening or loculated pleural effusion is unchanged. Small bilateral pleural effusions are present. No superimposed focal pulmonary infiltrate. No pneumothorax. Coronary artery bypass grafting has been performed. Mild cardiomegaly is stable. Pulmonary vascularity is normal. IMPRESSION: Progressive pulmonary hypoinflation. Stable laterally loculated left pleural effusion versus pleural thickening. Stable small bilateral pleural effusions. Stable cardiomegaly. Electronically Signed   By: Fidela Salisbury M.D.   On: 12/29/2020 02:27     Assessment and Plan:   NSVT --PVCs and nonsustained VT noted between 2 AM and 3 AM.  He was started on IV amiodarone.  Current telemetry shows resolution of previous NSVT.  Recommend wean IV amiodarone as tolerated. Electrolytes/TSH WNL at presentation.  Considered is NSVT 2/2 underlying coronary insufficiency with recent NSTEMI and declined cath with  subsequent 48h IV heparin completed.  At this time, no plan for  catheterization, given he has declined ischemic work-up in the past, and as he is not an ideal candidate for LHC as below.  Continue to monitor on telemetry.   Acute on chronic HFrEF --Reports SOB, improved on O2.  Suspect SOB likely multifactorial with consideration of known low EF, NSVT/tachycardia, recent pna, and anemia. Recent admission with po diuresis increased at discharge to torsmide '40mg'$  BID. BNP over 1000. CT without clear evidence of PE and motion artifact, though less likely PE as on Coumadin, 9/6 INR 2.1. Previous LE [place] 8/22 without DVT. He has stable bilateral pleural effusions from previous admission with R > than partially loculated L.  --Caution with aggressive IV diuresis. S/p lasix '40mg'$  x1 in ED. Low output HF with current low BP and Cr up from that of baseline at 1.08. Possible UTI also noted. Wt down from that of discharge wt at 98.6kg (102kg at discharge). Monitor volume status closely. Current escalation of GDMT precluded by low BP, Cr at this time.  NSTEMI History of CAD s/p CABG 1981 / redo CABG 2016 --No CP. HS Tn minimally elevated, flat trending. EKG as above. Suspect supply demand ischemia in the setting of his comorbid conditions including rapid ventricular rate, though as above, NSVT could reflect underlying ischemia/ Recently admitted for NSTEMI with LHC declined. Known history of CAD and CABG. Not an ideal candidate for catheterization given frailty / comorbids / respiratory status and high risk for decompensation with current DNR/DNI. He has refused catheterization several times in the recent past. Continue medical management as tolerated by BP/Cr. Not on ASA given need for anticoagulation with LV thrombus.   History of LV thrombus on PTA Coumadin -- In the setting of ischemic cardiomyopathy/HFrEF.  Continue anticoagulation. INR 2.1.    Essential hypertension --BP soft / controlled.  Caution with antihypertensives.   HLD --Continue statin, recently increased to  Lipitor 80 due to LDL of 136 in April 2022.  Recheck of lipid and liver function at outpatient follow-up.   Normocytic anemia --Stable H&H.  Daily CBC.  Remainder per IM  For questions or updates, please contact [place] Please consult [email or web address] for contact info under    Signed, Arvil Chaco, PA-C  12/08/2020 1:07 PM

## 2020-12-07 DIAGNOSIS — J9621 Acute and chronic respiratory failure with hypoxia: Secondary | ICD-10-CM | POA: Diagnosis not present

## 2020-12-07 LAB — TYPE AND SCREEN
ABO/RH(D): O POS
Antibody Screen: NEGATIVE

## 2020-12-07 LAB — URINE CULTURE

## 2020-12-07 MED ORDER — MORPHINE SULFATE (PF) 4 MG/ML IV SOLN
4.0000 mg | INTRAVENOUS | Status: DC | PRN
  Administered 2020-12-07: 02:00:00 4 mg via INTRAVENOUS
  Filled 2020-12-07: qty 1

## 2020-12-11 LAB — CULTURE, BLOOD (ROUTINE X 2)
Culture: NO GROWTH
Culture: NO GROWTH
Special Requests: ADEQUATE
Special Requests: ADEQUATE

## 2020-12-14 ENCOUNTER — Ambulatory Visit: Payer: Medicare HMO | Admitting: Cardiology

## 2020-12-20 DIAGNOSIS — I5022 Chronic systolic (congestive) heart failure: Secondary | ICD-10-CM | POA: Diagnosis not present

## 2020-12-20 DIAGNOSIS — R0602 Shortness of breath: Secondary | ICD-10-CM | POA: Diagnosis not present

## 2020-12-20 DIAGNOSIS — I739 Peripheral vascular disease, unspecified: Secondary | ICD-10-CM | POA: Diagnosis not present

## 2020-12-20 DIAGNOSIS — I639 Cerebral infarction, unspecified: Secondary | ICD-10-CM | POA: Diagnosis not present

## 2020-12-20 DIAGNOSIS — N1831 Chronic kidney disease, stage 3a: Secondary | ICD-10-CM | POA: Diagnosis not present

## 2020-12-31 NOTE — TOC Progression Note (Signed)
Transition of Care [facility]) - Progression Note    Patient Details  Name: Benjamin Cordova MRN: [id number] Date of Birth: [birth year]  Transition of Care [facility]) CM/SW [place], RN Phone Number: 2020-12-19, 1:26 PM  Clinical Narrative:   As per son, Elenore Rota, referral to [facility] made for hospice care, possible hospice house.  TOC will follow.         Expected Discharge Plan and Services                                                 Social Determinants of Health (SDOH) Interventions    Readmission Risk Interventions Readmission Risk Prevention Plan 11/10/2020 10/11/2020 07/04/2019  Transportation Screening Complete Complete Complete  PCP or Specialist Appt within 3-5 Days - Complete Not Complete  HRI or Home Care Consult Complete Complete Complete  Social Work Consult for [place] Planning/Counseling Complete Complete Complete  Palliative Care Screening - Not Applicable Not Applicable  Medication Review [employer] photographer) Complete Complete Complete  Some recent data might be hidden

## 2020-12-31 NOTE — Progress Notes (Addendum)
[facility]) Hospital Liaison Note  Received request from Transitions of Care Manager Dreama Saa, RN for family interest in [facility]. Visited patient at bedside and spoke with son Brittian Borchert, [name] over the phone to confirm interest and explain services. Patient chart and information reviewed by [facility] physician. Hospice Home eligibility confirmed.   Unfortunately Hospice Home is not able to offer a room today. Family and TOC Manager Dreama Saa, RN aware hospital liaison will follow up tomorrow or sooner if a room becomes available.   Please do not hesitate to call with any hospice related questions.   Thank you for the opportunity to participate in this patient's care.  Bobbie "Loren Racer, RN, BSN [facility] Liaison [phone]

## 2020-12-31 NOTE — Death Summary Note (Signed)
DEATH SUMMARY   Patient Details  Name: Benjamin Cordova MRN: [id number] DOB: Nov 07, 1934  Admission/Discharge Information   Admit Date:  2020-12-28  Date of Death: Date of Death: 12-29-2020  Time of Death: Time of Death: 07/11/1518  Length of Stay: 1  Referring Physician: Pablo Lawrence, NP   Reason(s) for Hospitalization  Increasing shortness of Breath  Diagnoses  Preliminary cause of death: Acute on chronic hypoxic respiratory failure secondary to severe end-stage ischemic cardiomyopathy and acute on chronic systolic congestive heart failure Secondary Diagnoses (including complications and co-morbidities):  Principal Problem:   Acute on chronic respiratory failure with hypoxia ([place]) Active Problems:   Coronary artery disease   Hypertension   Paroxysmal atrial fibrillation (HCC)   Anemia   Acute respiratory failure with hypoxia and hypercapnia (HCC)   LV (left ventricular) mural thrombus   Systolic and diastolic CHF, acute on chronic ([place])   Acute respiratory failure with hypoxia ([place])   Acute hypoxemic respiratory failure [facility])   Brief Hospital Course (including significant findings, care, treatment, and services provided and events leading to death)   LACORY WARRIX is a 86 y.o. male with PMH significant for HTN, stroke, PAD, CAD/CABG, ischemic cardiomyopathy, paroxysmal A. fib, history of LV mural thrombus. Patient was brought to ED from [facility] rehab on 9/5 for evaluation of tachycardia and hypoxia will sats 60% on 2 L nasal cannula.  Patient was admitted with acute on chronic hypoxic respiratory failure in the setting of severe ischemic cardiomyopathy with EF of 20% acute on chronic systolic congestive heart failure. He continues to worsen and required intermittent BiPAP. Patient was a DNR. Discussion was made with family and given overall poor prognosis son requested comfort care. Patient was transition to full comfort care. Time of death 1520. Pertinent Labs and Studies   Significant Diagnostic Studies DG Chest 1 View  Result Date: 11/10/2020 CLINICAL DATA:  Shortness of breath EXAM: CHEST  1 VIEW COMPARISON:  11/08/2020 chest x-ray and CT FINDINGS: Post sternotomy changes. Moderate bilateral pleural effusions increased compared to prior. Worsening airspace disease at the bases. Cardiomegaly with vascular congestion and probable pulmonary edema. Aortic atherosclerosis. Right lower rib fracture. IMPRESSION: Cardiomegaly with vascular congestion and probable pulmonary edema. Increasing moderate bilateral pleural effusions and worsening basilar airspace disease Electronically Signed   By: Donavan Foil M.D.   On: 11/10/2020 21:46   DG Chest 2 View  Result Date: 11/13/2020 CLINICAL DATA:  Shortness of breath, respiratory distress. EXAM: CHEST - 2 VIEW COMPARISON:  Chest radiograph dated 11/10/2020. FINDINGS: The heart is enlarged. Vascular calcifications are seen in the aortic arch. Median sternotomy wires are redemonstrated. There are small bilateral pleural effusions with associated atelectasis/airspace disease. There is no pneumothorax. Degenerative changes are seen in the spine. Multiple right-sided rib fractures are redemonstrated. IMPRESSION: Cardiomegaly and small bilateral pleural effusions. Aortic Atherosclerosis (ICD10-I70.0). Electronically Signed   By: Zerita Boers M.D.   On: 11/13/2020 15:00   CT HEAD WO CONTRAST (5MM)  Result Date: December 28, 2020 CLINICAL DATA:  86 year old male with acute respiratory distress, V-tach. Abdominal distension. EXAM: CT HEAD WITHOUT CONTRAST TECHNIQUE: Contiguous axial images were obtained from the base of the skull through the vertex without intravenous contrast. COMPARISON:  Head CT 11/08/2020 and earlier. FINDINGS: Brain: Chronic right MCA infarct with encephalomalacia. Stable cerebral volume. Stable mild ex vacuo enlargement of the right lateral ventricle. No midline shift, ventriculomegaly, mass effect, evidence of mass lesion,  intracranial hemorrhage or evidence of cortically based acute infarction. Stable gray-white matter differentiation  throughout the brain. Vascular: Calcified atherosclerosis at the skull base. No suspicious intracranial vascular hyperdensity. Skull: No acute osseous abnormality identified. Hyperostosis frontalis, normal variant. Sinuses/Orbits: Visualized paranasal sinuses and mastoids are stable and generally well aerated. Chronic left maxillary mucous retention cysts. Mild right mastoid effusion. Other: No acute orbit or scalp soft tissue finding. IMPRESSION: 1. No acute intracranial abnormality. 2. Chronic Right MCA infarct. Electronically Signed   By: Genevie Ann M.D.   On: 12/13/2020 05:03   CT Angio Chest PE W and/or Wo Contrast  Result Date: 12/19/2020 CLINICAL DATA:  86 year old male with acute respiratory distress, V-tach. Abdominal distension. EXAM: CT ANGIOGRAPHY CHEST CT ABDOMEN AND PELVIS WITH CONTRAST TECHNIQUE: Multidetector CT imaging of the chest was performed using the standard protocol during bolus administration of intravenous contrast. Multiplanar CT image reconstructions and MIPs were obtained to evaluate the vascular anatomy. Multidetector CT imaging of the abdomen and pelvis was performed using the standard protocol during bolus administration of intravenous contrast. CONTRAST:  125m OMNIPAQUE IOHEXOL 350 MG/ML SOLN COMPARISON:  CTA chest 11/08/2020. CT Abdomen and Pelvis 09/13/2010. FINDINGS: CTA CHEST FINDINGS Cardiovascular: Good contrast bolus timing in the pulmonary arterial tree. Respiratory motion. No central or saddle embolus. Heterogeneous appearance of left upper lobe pulmonary arteries on series 5, image 83 is felt more likely to be motion artifact than small peripheral PE, and the distal branches there appear to remain patent. Similar degraded detail of other bilateral distal pulmonary arteries. No convincing pulmonary embolus. Prior CABG. Stable cardiomegaly. No pericardial  effusion. Calcified aortic atherosclerosis. Little contrast in the aorta today. Mediastinum/Nodes: Stable.  No mediastinal lymphadenopathy. Lungs/Pleura: Partially layering and loculated bilateral pleural effusions are stable since last month, moderate on the right and smaller on the left. Some chronic left pleural thickening is redemonstrated. Respiratory motion artifact. Atelectatic changes to the major airways which remain patent. Trace retained secretions in the dependent trachea near the thoracic inlet. Ongoing lower lobe compressive atelectasis suspected, with some enhancement of the compressed right lower lobe. Overall ventilation has not significantly changed since last month. Musculoskeletal: Prior sternotomy. Chronic right lateral 6th through 8th rib fractures. Chronic right posterior 7th through 10th rib fractures also appear stable. No new osseous abnormality. Review of the MIP images confirms the above findings. CT ABDOMEN and PELVIS FINDINGS Hepatobiliary: Chronic surgical clips along the left hepatic lobe are stable since 2012. Partially contracted gallbladder. No definite pericholecystic inflammation. Liver enhancement is within normal limits. No bile duct enlargement. Pancreas: Negative. Spleen: Negative. Adrenals/Urinary Tract: Stable adrenal glands, small right adrenal lateral limb adenoma is unchanged since 2012. Multiple bilateral renal cysts. The right kidney appears nonobstructed with diminutive renal pelvis and decompressed ureter to the bladder. On the left there is mild left hydronephrosis and asymmetric left hydroureter which drastically tapers as the ureter crosses into the pelvis. See series 2, images 61 through 80. The distal ureter then is decompressed. No ureteral calculus or obstructing etiology identified. Absence of renal contrast excretion on the delayed images is symmetric. The bladder is decompressed by Foley catheter but thick-walled. The bladder wall appears indistinct.  Stomach/Bowel: Large stool ball in the rectosigmoid colon. See sagittal image 77. Mild presacral stranding is associated. Redundant proximal sigmoid colon. Intermittent retained stool throughout the large bowel upstream of the sigmoid. No large bowel wall thickening. Normal appendix suspected on series 2, image 67. Decompressed terminal ileum. No dilated small bowel. Decompressed stomach and duodenum. No free air. No free fluid. Vascular/Lymphatic: Aortoiliac calcified atherosclerosis. Major arterial structures remain  patent. Portal venous system appears patent. No lymphadenopathy. Reproductive: Urethral catheter in place, otherwise negative. Other: No pelvic free fluid. Musculoskeletal: Progressed lumbar spine degeneration since 2012. No acute osseous abnormality identified. Review of the MIP images confirms the above findings. IMPRESSION: 1. Motion degraded Chest CTA with no convincing pulmonary embolus. 2. Stable ventilation. Right > left partially loculated pleural effusions are stable since last month, moderate on the right. Stable associated compressive atelectasis. 3. Large stool ball in the rectosigmoid colon with mild presacral stranding suggests Fecal Impaction. No other bowel inflammation. 4. Mild to moderate left hydronephrosis and left hydroureter with abrupt tapering of the left ureter at the pelvic inlet is suspicious for a Ureteral Stricture. No obstructing calculus identified. Also, absence contrast excretion on the delayed images raises the possibility of bilateral renal insufficiency. 5. Bladder decompressed by Foley catheter. Superimposed bladder wall thickening and indistinctness raises the possibility of UTI/Cystitis. 6. Stable cardiomegaly. Prior CABG. Aortic Atherosclerosis (ICD10-I70.0). Electronically Signed   By: Genevie Ann M.D.   On: 12/30/2020 05:18   CT ABDOMEN PELVIS W CONTRAST  Result Date: 12/12/2020 CLINICAL DATA:  86 year old male with acute respiratory distress, V-tach. Abdominal  distension. EXAM: CT ANGIOGRAPHY CHEST CT ABDOMEN AND PELVIS WITH CONTRAST TECHNIQUE: Multidetector CT imaging of the chest was performed using the standard protocol during bolus administration of intravenous contrast. Multiplanar CT image reconstructions and MIPs were obtained to evaluate the vascular anatomy. Multidetector CT imaging of the abdomen and pelvis was performed using the standard protocol during bolus administration of intravenous contrast. CONTRAST:  169m OMNIPAQUE IOHEXOL 350 MG/ML SOLN COMPARISON:  CTA chest 11/08/2020. CT Abdomen and Pelvis 09/13/2010. FINDINGS: CTA CHEST FINDINGS Cardiovascular: Good contrast bolus timing in the pulmonary arterial tree. Respiratory motion. No central or saddle embolus. Heterogeneous appearance of left upper lobe pulmonary arteries on series 5, image 83 is felt more likely to be motion artifact than small peripheral PE, and the distal branches there appear to remain patent. Similar degraded detail of other bilateral distal pulmonary arteries. No convincing pulmonary embolus. Prior CABG. Stable cardiomegaly. No pericardial effusion. Calcified aortic atherosclerosis. Little contrast in the aorta today. Mediastinum/Nodes: Stable.  No mediastinal lymphadenopathy. Lungs/Pleura: Partially layering and loculated bilateral pleural effusions are stable since last month, moderate on the right and smaller on the left. Some chronic left pleural thickening is redemonstrated. Respiratory motion artifact. Atelectatic changes to the major airways which remain patent. Trace retained secretions in the dependent trachea near the thoracic inlet. Ongoing lower lobe compressive atelectasis suspected, with some enhancement of the compressed right lower lobe. Overall ventilation has not significantly changed since last month. Musculoskeletal: Prior sternotomy. Chronic right lateral 6th through 8th rib fractures. Chronic right posterior 7th through 10th rib fractures also appear stable.  No new osseous abnormality. Review of the MIP images confirms the above findings. CT ABDOMEN and PELVIS FINDINGS Hepatobiliary: Chronic surgical clips along the left hepatic lobe are stable since 2012. Partially contracted gallbladder. No definite pericholecystic inflammation. Liver enhancement is within normal limits. No bile duct enlargement. Pancreas: Negative. Spleen: Negative. Adrenals/Urinary Tract: Stable adrenal glands, small right adrenal lateral limb adenoma is unchanged since 2012. Multiple bilateral renal cysts. The right kidney appears nonobstructed with diminutive renal pelvis and decompressed ureter to the bladder. On the left there is mild left hydronephrosis and asymmetric left hydroureter which drastically tapers as the ureter crosses into the pelvis. See series 2, images 61 through 80. The distal ureter then is decompressed. No ureteral calculus or obstructing etiology identified. Absence  of renal contrast excretion on the delayed images is symmetric. The bladder is decompressed by Foley catheter but thick-walled. The bladder wall appears indistinct. Stomach/Bowel: Large stool ball in the rectosigmoid colon. See sagittal image 77. Mild presacral stranding is associated. Redundant proximal sigmoid colon. Intermittent retained stool throughout the large bowel upstream of the sigmoid. No large bowel wall thickening. Normal appendix suspected on series 2, image 67. Decompressed terminal ileum. No dilated small bowel. Decompressed stomach and duodenum. No free air. No free fluid. Vascular/Lymphatic: Aortoiliac calcified atherosclerosis. Major arterial structures remain patent. Portal venous system appears patent. No lymphadenopathy. Reproductive: Urethral catheter in place, otherwise negative. Other: No pelvic free fluid. Musculoskeletal: Progressed lumbar spine degeneration since 2012. No acute osseous abnormality identified. Review of the MIP images confirms the above findings. IMPRESSION: 1. Motion  degraded Chest CTA with no convincing pulmonary embolus. 2. Stable ventilation. Right > left partially loculated pleural effusions are stable since last month, moderate on the right. Stable associated compressive atelectasis. 3. Large stool ball in the rectosigmoid colon with mild presacral stranding suggests Fecal Impaction. No other bowel inflammation. 4. Mild to moderate left hydronephrosis and left hydroureter with abrupt tapering of the left ureter at the pelvic inlet is suspicious for a Ureteral Stricture. No obstructing calculus identified. Also, absence contrast excretion on the delayed images raises the possibility of bilateral renal insufficiency. 5. Bladder decompressed by Foley catheter. Superimposed bladder wall thickening and indistinctness raises the possibility of UTI/Cystitis. 6. Stable cardiomegaly. Prior CABG. Aortic Atherosclerosis (ICD10-I70.0). Electronically Signed   By: Genevie Ann M.D.   On: 12/16/2020 05:18   DG Chest Port 1 View  Result Date: 12/03/2020 CLINICAL DATA:  Respiratory distress EXAM: PORTABLE CHEST 1 VIEW COMPARISON:  CT 12/21/2020 FINDINGS: Prior CABG. Cardiomegaly, vascular congestion. Diffuse bilateral airspace disease probable layering effusions. Right rib fractures are again noted as seen on earlier CT and prior imaging. IMPRESSION: Cardiomegaly with diffuse bilateral airspace disease and layering effusions, likely edema/CHF. Electronically Signed   By: Rolm Baptise M.D.   On: 12/05/2020 19:56   DG Chest Portable 1 View  Result Date: 12/14/2020 CLINICAL DATA:  Dyspnea EXAM: PORTABLE CHEST 1 VIEW COMPARISON:  11/13/2020 FINDINGS: Lung volumes are small and pulmonary insufflation has diminished when compared to prior examination. Left lateral pleural thickening or loculated pleural effusion is unchanged. Small bilateral pleural effusions are present. No superimposed focal pulmonary infiltrate. No pneumothorax. Coronary artery bypass grafting has been performed. Mild  cardiomegaly is stable. Pulmonary vascularity is normal. IMPRESSION: Progressive pulmonary hypoinflation. Stable laterally loculated left pleural effusion versus pleural thickening. Stable small bilateral pleural effusions. Stable cardiomegaly. Electronically Signed   By: Fidela Salisbury M.D.   On: 12/04/2020 02:27    Microbiology Recent Results (from the past 240 hour(s))  Resp Panel by RT-PCR (Flu A&B, Covid) Nasopharyngeal Swab     Status: None   Collection Time: 12/23/2020  2:01 AM   Specimen: Nasopharyngeal Swab; Nasopharyngeal(NP) swabs in vial transport medium  Result Value Ref Range Status   SARS Coronavirus 2 by RT PCR NEGATIVE NEGATIVE Final    Comment: (NOTE) SARS-CoV-2 target nucleic acids are NOT DETECTED.  The SARS-CoV-2 RNA is generally detectable in upper respiratory specimens during the acute phase of infection. The lowest concentration of SARS-CoV-2 viral copies this assay can detect is 138 copies/mL. A negative result does not preclude SARS-Cov-2 infection and should not be used as the sole basis for treatment or other patient management decisions. A negative result may occur with  improper  specimen collection/handling, submission of specimen other than nasopharyngeal swab, presence of viral mutation(s) within the areas targeted by this assay, and inadequate number of viral copies(<138 copies/mL). A negative result must be combined with clinical observations, patient history, and epidemiological information. The expected result is Negative.  Fact Sheet for Patients:  EntrepreneurPulse.com.au  Fact Sheet for Healthcare Providers:  IncredibleEmployment.be  This test is no t yet approved or cleared by the [place] FDA and  has been authorized for detection and/or diagnosis of SARS-CoV-2 by FDA under an Emergency Use Authorization (EUA). This EUA will remain  in effect (meaning this test can be used) for the duration of  the COVID-19 declaration under Section 564(b)(1) of the Act, 21 U.S.C.section 360bbb-3(b)(1), unless the authorization is terminated  or revoked sooner.       Influenza A by PCR NEGATIVE NEGATIVE Final   Influenza B by PCR NEGATIVE NEGATIVE Final    Comment: (NOTE) The Xpert Xpress SARS-CoV-2/FLU/RSV plus assay is intended as an aid in the diagnosis of influenza from Nasopharyngeal swab specimens and should not be used as a sole basis for treatment. Nasal washings and aspirates are unacceptable for Xpert Xpress SARS-CoV-2/FLU/RSV testing.  Fact Sheet for Patients: EntrepreneurPulse.com.au  Fact Sheet for Healthcare Providers: IncredibleEmployment.be  This test is not yet approved or cleared by the [place] FDA and has been authorized for detection and/or diagnosis of SARS-CoV-2 by FDA under an Emergency Use Authorization (EUA). This EUA will remain in effect (meaning this test can be used) for the duration of the COVID-19 declaration under Section 564(b)(1) of the Act, 21 U.S.C. section 360bbb-3(b)(1), unless the authorization is terminated or revoked.  Performed at [facility], [address] [place], [place] [address]   Blood culture (routine x 2)     Status: None (Preliminary result)   Collection Time: 12/02/2020  2:19 AM   Specimen: BLOOD  Result Value Ref Range Status   Specimen Description BLOOD RIGHT ASSIST CONTROL  Final   Special Requests   Final    BOTTLES DRAWN AEROBIC AND ANAEROBIC Blood Culture adequate volume   Culture   Final    NO GROWTH 3 DAYS Performed at [facility], [address], [place], [place] [address]    Report Status PENDING  Incomplete  Blood culture (routine x 2)     Status: None (Preliminary result)   Collection Time: 12/22/2020  2:19 AM   Specimen: BLOOD  Result Value Ref Range Status   Specimen Description BLOOD LEFT WRIST  Final   Special Requests   Final    BOTTLES DRAWN  AEROBIC AND ANAEROBIC Blood Culture adequate volume   Culture   Final    NO GROWTH 3 DAYS Performed at [facility], [address] [place], [place] [address]    Report Status PENDING  Incomplete  Urine Culture     Status: Abnormal   Collection Time: 12/09/2020  2:33 AM   Specimen: Urine, Clean Catch  Result Value Ref Range Status   Specimen Description   Final    URINE, CLEAN CATCH Performed at [facility], [address] [place], [place] [address]    Special Requests   Final    NONE Performed at [facility], [address], [place], [place] [address]    Culture MULTIPLE SPECIES PRESENT, SUGGEST RECOLLECTION (A)  Final   Report Status 2021/01/03 FINAL  Final    Lab Basic Metabolic Panel: Recent Labs  Lab 12/21/2020 0201  NA 138  K 4.4  CL 86*  CO2 43*  GLUCOSE 157*  BUN 45*  CREATININE 1.08  CALCIUM 8.9  MG 2.4   Liver Function Tests: Recent Labs  Lab 12/10/2020 0201  AST 19  ALT 18  ALKPHOS 54  BILITOT 0.8  PROT 6.8  ALBUMIN 3.9   No results for input(s): LIPASE, AMYLASE in the last 168 hours. No results for input(s): AMMONIA in the last 168 hours. CBC: Recent Labs  Lab 12/15/2020 0201  WBC 12.8*  NEUTROABS 11.6*  HGB 10.7*  HCT 34.4*  MCV 98.0  PLT 172   Cardiac Enzymes: No results for input(s): CKTOTAL, CKMB, CKMBINDEX, TROPONINI in the last 168 hours. Sepsis Labs: Recent Labs  Lab 12/14/2020 0201 12/09/2020 0208 12/21/2020 0424  PROCALCITON  --   --  <0.10  WBC 12.8*  --   --   LATICACIDVEN  --  1.4 1.2    Procedures/Operations     Fritzi Mandes 12/09/2020, 4:03 PM

## 2020-12-31 NOTE — Progress Notes (Signed)
Patient passed away at 1520 with family present.  MD and [facility] notified.  Verified by 2 RNS.  Post mortem care preformed and ready for transport.  Dentures in place.

## 2020-12-31 NOTE — Progress Notes (Signed)
[facility] NAME: Kene Wooldridge    MR#:  [id number]  DATE OF BIRTH:  October 25, 1934  SUBJECTIVE:  no family at bedside.  Events of last night noted. Nurse practitioner reached out to patient's son and decision was made for transitioning to comfort care given multiple comorbidities and poor heart condition.  Currently is resting quietly no issues per RN REVIEW OF SYSTEMS:   Review of Systems  Unable to perform ROS: Acuity of condition pt under comfort care  DRUG ALLERGIES:  No Known Allergies  VITALS:  Blood pressure 108/70, pulse 92, temperature 98.1 F (36.7 C), resp. rate (!) 36, height '5\' 8"'$  (1.727 m), weight 101.2 kg, SpO2 100 %.  PHYSICAL EXAMINATION:   Physical Examlimited  GENERAL:  86 y.o.-year-old patient lying in the bed with no acute distress.  LUNGS: decreased breath sounds bilaterally CARDIOVASCULAR: S1, S2 normal PSYCHIATRIC:  patient is lethargic   LABORATORY PANEL:  CBC Recent Labs  Lab 12/01/2020 0201  WBC 12.8*  HGB 10.7*  HCT 34.4*  PLT 172     Chemistries  Recent Labs  Lab 12/08/2020 0201  NA 138  K 4.4  CL 86*  CO2 43*  GLUCOSE 157*  BUN 45*  CREATININE 1.08  CALCIUM 8.9  MG 2.4  AST 19  ALT 18  ALKPHOS 54  BILITOT 0.8    Cardiac Enzymes No results for input(s): TROPONINI in the last 168 hours. RADIOLOGY:  CT HEAD WO CONTRAST (5MM)  Result Date: 12/02/2020 CLINICAL DATA:  86 year old male with acute respiratory distress, V-tach. Abdominal distension. EXAM: CT HEAD WITHOUT CONTRAST TECHNIQUE: Contiguous axial images were obtained from the base of the skull through the vertex without intravenous contrast. COMPARISON:  Head CT 11/08/2020 and earlier. FINDINGS: Brain: Chronic right MCA infarct with encephalomalacia. Stable cerebral volume. Stable mild ex vacuo enlargement of the right lateral ventricle. No midline shift, ventriculomegaly, mass effect, evidence of mass lesion, intracranial  hemorrhage or evidence of cortically based acute infarction. Stable gray-white matter differentiation throughout the brain. Vascular: Calcified atherosclerosis at the skull base. No suspicious intracranial vascular hyperdensity. Skull: No acute osseous abnormality identified. Hyperostosis frontalis, normal variant. Sinuses/Orbits: Visualized paranasal sinuses and mastoids are stable and generally well aerated. Chronic left maxillary mucous retention cysts. Mild right mastoid effusion. Other: No acute orbit or scalp soft tissue finding. IMPRESSION: 1. No acute intracranial abnormality. 2. Chronic Right MCA infarct. Electronically Signed   By: Genevie Ann M.D.   On: 12/17/2020 05:03   CT Angio Chest PE W and/or Wo Contrast  Result Date: 12/03/2020 CLINICAL DATA:  86 year old male with acute respiratory distress, V-tach. Abdominal distension. EXAM: CT ANGIOGRAPHY CHEST CT ABDOMEN AND PELVIS WITH CONTRAST TECHNIQUE: Multidetector CT imaging of the chest was performed using the standard protocol during bolus administration of intravenous contrast. Multiplanar CT image reconstructions and MIPs were obtained to evaluate the vascular anatomy. Multidetector CT imaging of the abdomen and pelvis was performed using the standard protocol during bolus administration of intravenous contrast. CONTRAST:  128m OMNIPAQUE IOHEXOL 350 MG/ML SOLN COMPARISON:  CTA chest 11/08/2020. CT Abdomen and Pelvis 09/13/2010. FINDINGS: CTA CHEST FINDINGS Cardiovascular: Good contrast bolus timing in the pulmonary arterial tree. Respiratory motion. No central or saddle embolus. Heterogeneous appearance of left upper lobe pulmonary arteries on series 5, image 83 is felt more likely to be motion artifact than small peripheral PE, and the distal branches there appear to remain patent. Similar degraded detail of other bilateral distal  pulmonary arteries. No convincing pulmonary embolus. Prior CABG. Stable cardiomegaly. No pericardial effusion. Calcified  aortic atherosclerosis. Little contrast in the aorta today. Mediastinum/Nodes: Stable.  No mediastinal lymphadenopathy. Lungs/Pleura: Partially layering and loculated bilateral pleural effusions are stable since last month, moderate on the right and smaller on the left. Some chronic left pleural thickening is redemonstrated. Respiratory motion artifact. Atelectatic changes to the major airways which remain patent. Trace retained secretions in the dependent trachea near the thoracic inlet. Ongoing lower lobe compressive atelectasis suspected, with some enhancement of the compressed right lower lobe. Overall ventilation has not significantly changed since last month. Musculoskeletal: Prior sternotomy. Chronic right lateral 6th through 8th rib fractures. Chronic right posterior 7th through 10th rib fractures also appear stable. No new osseous abnormality. Review of the MIP images confirms the above findings. CT ABDOMEN and PELVIS FINDINGS Hepatobiliary: Chronic surgical clips along the left hepatic lobe are stable since 2012. Partially contracted gallbladder. No definite pericholecystic inflammation. Liver enhancement is within normal limits. No bile duct enlargement. Pancreas: Negative. Spleen: Negative. Adrenals/Urinary Tract: Stable adrenal glands, small right adrenal lateral limb adenoma is unchanged since 2012. Multiple bilateral renal cysts. The right kidney appears nonobstructed with diminutive renal pelvis and decompressed ureter to the bladder. On the left there is mild left hydronephrosis and asymmetric left hydroureter which drastically tapers as the ureter crosses into the pelvis. See series 2, images 61 through 80. The distal ureter then is decompressed. No ureteral calculus or obstructing etiology identified. Absence of renal contrast excretion on the delayed images is symmetric. The bladder is decompressed by Foley catheter but thick-walled. The bladder wall appears indistinct. Stomach/Bowel: Large stool  ball in the rectosigmoid colon. See sagittal image 77. Mild presacral stranding is associated. Redundant proximal sigmoid colon. Intermittent retained stool throughout the large bowel upstream of the sigmoid. No large bowel wall thickening. Normal appendix suspected on series 2, image 67. Decompressed terminal ileum. No dilated small bowel. Decompressed stomach and duodenum. No free air. No free fluid. Vascular/Lymphatic: Aortoiliac calcified atherosclerosis. Major arterial structures remain patent. Portal venous system appears patent. No lymphadenopathy. Reproductive: Urethral catheter in place, otherwise negative. Other: No pelvic free fluid. Musculoskeletal: Progressed lumbar spine degeneration since 2012. No acute osseous abnormality identified. Review of the MIP images confirms the above findings. IMPRESSION: 1. Motion degraded Chest CTA with no convincing pulmonary embolus. 2. Stable ventilation. Right > left partially loculated pleural effusions are stable since last month, moderate on the right. Stable associated compressive atelectasis. 3. Large stool ball in the rectosigmoid colon with mild presacral stranding suggests Fecal Impaction. No other bowel inflammation. 4. Mild to moderate left hydronephrosis and left hydroureter with abrupt tapering of the left ureter at the pelvic inlet is suspicious for a Ureteral Stricture. No obstructing calculus identified. Also, absence contrast excretion on the delayed images raises the possibility of bilateral renal insufficiency. 5. Bladder decompressed by Foley catheter. Superimposed bladder wall thickening and indistinctness raises the possibility of UTI/Cystitis. 6. Stable cardiomegaly. Prior CABG. Aortic Atherosclerosis (ICD10-I70.0). Electronically Signed   By: Genevie Ann M.D.   On: 12/09/2020 05:18   CT ABDOMEN PELVIS W CONTRAST  Result Date: 12/27/2020 CLINICAL DATA:  86 year old male with acute respiratory distress, V-tach. Abdominal distension. EXAM: CT  ANGIOGRAPHY CHEST CT ABDOMEN AND PELVIS WITH CONTRAST TECHNIQUE: Multidetector CT imaging of the chest was performed using the standard protocol during bolus administration of intravenous contrast. Multiplanar CT image reconstructions and MIPs were obtained to evaluate the vascular anatomy. Multidetector CT imaging of the  abdomen and pelvis was performed using the standard protocol during bolus administration of intravenous contrast. CONTRAST:  127m OMNIPAQUE IOHEXOL 350 MG/ML SOLN COMPARISON:  CTA chest 11/08/2020. CT Abdomen and Pelvis 09/13/2010. FINDINGS: CTA CHEST FINDINGS Cardiovascular: Good contrast bolus timing in the pulmonary arterial tree. Respiratory motion. No central or saddle embolus. Heterogeneous appearance of left upper lobe pulmonary arteries on series 5, image 83 is felt more likely to be motion artifact than small peripheral PE, and the distal branches there appear to remain patent. Similar degraded detail of other bilateral distal pulmonary arteries. No convincing pulmonary embolus. Prior CABG. Stable cardiomegaly. No pericardial effusion. Calcified aortic atherosclerosis. Little contrast in the aorta today. Mediastinum/Nodes: Stable.  No mediastinal lymphadenopathy. Lungs/Pleura: Partially layering and loculated bilateral pleural effusions are stable since last month, moderate on the right and smaller on the left. Some chronic left pleural thickening is redemonstrated. Respiratory motion artifact. Atelectatic changes to the major airways which remain patent. Trace retained secretions in the dependent trachea near the thoracic inlet. Ongoing lower lobe compressive atelectasis suspected, with some enhancement of the compressed right lower lobe. Overall ventilation has not significantly changed since last month. Musculoskeletal: Prior sternotomy. Chronic right lateral 6th through 8th rib fractures. Chronic right posterior 7th through 10th rib fractures also appear stable. No new osseous  abnormality. Review of the MIP images confirms the above findings. CT ABDOMEN and PELVIS FINDINGS Hepatobiliary: Chronic surgical clips along the left hepatic lobe are stable since 2012. Partially contracted gallbladder. No definite pericholecystic inflammation. Liver enhancement is within normal limits. No bile duct enlargement. Pancreas: Negative. Spleen: Negative. Adrenals/Urinary Tract: Stable adrenal glands, small right adrenal lateral limb adenoma is unchanged since 2012. Multiple bilateral renal cysts. The right kidney appears nonobstructed with diminutive renal pelvis and decompressed ureter to the bladder. On the left there is mild left hydronephrosis and asymmetric left hydroureter which drastically tapers as the ureter crosses into the pelvis. See series 2, images 61 through 80. The distal ureter then is decompressed. No ureteral calculus or obstructing etiology identified. Absence of renal contrast excretion on the delayed images is symmetric. The bladder is decompressed by Foley catheter but thick-walled. The bladder wall appears indistinct. Stomach/Bowel: Large stool ball in the rectosigmoid colon. See sagittal image 77. Mild presacral stranding is associated. Redundant proximal sigmoid colon. Intermittent retained stool throughout the large bowel upstream of the sigmoid. No large bowel wall thickening. Normal appendix suspected on series 2, image 67. Decompressed terminal ileum. No dilated small bowel. Decompressed stomach and duodenum. No free air. No free fluid. Vascular/Lymphatic: Aortoiliac calcified atherosclerosis. Major arterial structures remain patent. Portal venous system appears patent. No lymphadenopathy. Reproductive: Urethral catheter in place, otherwise negative. Other: No pelvic free fluid. Musculoskeletal: Progressed lumbar spine degeneration since 2012. No acute osseous abnormality identified. Review of the MIP images confirms the above findings. IMPRESSION: 1. Motion degraded Chest  CTA with no convincing pulmonary embolus. 2. Stable ventilation. Right > left partially loculated pleural effusions are stable since last month, moderate on the right. Stable associated compressive atelectasis. 3. Large stool ball in the rectosigmoid colon with mild presacral stranding suggests Fecal Impaction. No other bowel inflammation. 4. Mild to moderate left hydronephrosis and left hydroureter with abrupt tapering of the left ureter at the pelvic inlet is suspicious for a Ureteral Stricture. No obstructing calculus identified. Also, absence contrast excretion on the delayed images raises the possibility of bilateral renal insufficiency. 5. Bladder decompressed by Foley catheter. Superimposed bladder wall thickening and indistinctness raises the possibility  of UTI/Cystitis. 6. Stable cardiomegaly. Prior CABG. Aortic Atherosclerosis (ICD10-I70.0). Electronically Signed   By: Genevie Ann M.D.   On: 12/20/2020 05:18   DG Chest Port 1 View  Result Date: 12/09/2020 CLINICAL DATA:  Respiratory distress EXAM: PORTABLE CHEST 1 VIEW COMPARISON:  CT 12/29/2020 FINDINGS: Prior CABG. Cardiomegaly, vascular congestion. Diffuse bilateral airspace disease probable layering effusions. Right rib fractures are again noted as seen on earlier CT and prior imaging. IMPRESSION: Cardiomegaly with diffuse bilateral airspace disease and layering effusions, likely edema/CHF. Electronically Signed   By: Rolm Baptise M.D.   On: 12/22/2020 19:56   DG Chest Portable 1 View  Result Date: 12/22/2020 CLINICAL DATA:  Dyspnea EXAM: PORTABLE CHEST 1 VIEW COMPARISON:  11/13/2020 FINDINGS: Lung volumes are small and pulmonary insufflation has diminished when compared to prior examination. Left lateral pleural thickening or loculated pleural effusion is unchanged. Small bilateral pleural effusions are present. No superimposed focal pulmonary infiltrate. No pneumothorax. Coronary artery bypass grafting has been performed. Mild cardiomegaly is  stable. Pulmonary vascularity is normal. IMPRESSION: Progressive pulmonary hypoinflation. Stable laterally loculated left pleural effusion versus pleural thickening. Stable small bilateral pleural effusions. Stable cardiomegaly. Electronically Signed   By: Fidela Salisbury M.D.   On: 12/19/2020 02:27   ASSESSMENT AND PLAN:  NICOHLAS STJULIEN is a 86 y.o. male with PMH significant for HTN, stroke, PAD, CAD/CABG, ischemic cardiomyopathy, paroxysmal A. fib, history of LV mural thrombus. Patient was brought to ED from [facility] rehab on 9/5 for evaluation of tachycardia and hypoxia will sats 60% on 2 L nasal cannula.  Acute on chronic hypoxic respiratory failure Acute on chronic systolic congestive heart failure with severe Ischemic cardiomyopathy/known EF of 20%  Paroxysmal atrial fibrillation nonsustainable VT in the setting of hypoxia LV thrombus   elevated troponin in the setting of hypoxia and CHF appears demand ischemia history of CAD/CABG  BPH with chronic Foley catheter  hyperlipidemia   patient decompensated last night. Currently is comfort care only. Discussed with patient's son Kaif Bagshaw. Lovena Le and he is in agreement with hospice referral.  Family communication : son Shalamar Shimoda Junior on the phone Consults : cardiology [facility] MG CODE STATUS: DNR-- care is out of facility yellow form DVT Prophylaxis : none --comfort care Level of care: Med-Surg Status is: Inpatient   Dispo: The patient is from: SNF              Anticipated d/c is to:TBD              Patient currently is not medically stable to d/c.   Difficult to place patient No  Await hospice referral and then possible hospice home      TOTAL TIME TAKING CARE OF THIS PATIENT: 20 minutes.  >50% time spent on counselling and coordination of care  Note: This dictation was prepared with Dragon dictation along with smaller phrase technology. Any transcriptional errors that result from this process are unintentional.  Fritzi Mandes M.D    [facility]   CC: Primary care physician; Pablo Lawrence, NP Patient ID: [name], male   DOB: 03/08/1935, 86 y.o.   MRN: [id number]

## 2020-12-31 DEATH — deceased

## 2021-01-03 ENCOUNTER — Ambulatory Visit: Payer: Medicare HMO | Admitting: Family
